# Patient Record
Sex: Female | Born: 1935
Health system: Southern US, Community
[De-identification: ages and names within clinical notes are randomized; demographics above are authoritative.]

## PROBLEM LIST (undated history)

## (undated) DIAGNOSIS — M25572 Pain in left ankle and joints of left foot: Secondary | ICD-10-CM

## (undated) DIAGNOSIS — N6019 Diffuse cystic mastopathy of unspecified breast: Secondary | ICD-10-CM

## (undated) DIAGNOSIS — K449 Diaphragmatic hernia without obstruction or gangrene: Secondary | ICD-10-CM

## (undated) DIAGNOSIS — IMO0001 Reserved for inherently not codable concepts without codable children: Secondary | ICD-10-CM

## (undated) DIAGNOSIS — M1990 Unspecified osteoarthritis, unspecified site: Secondary | ICD-10-CM

## (undated) DIAGNOSIS — I2510 Atherosclerotic heart disease of native coronary artery without angina pectoris: Secondary | ICD-10-CM

## (undated) DIAGNOSIS — D573 Sickle-cell trait: Secondary | ICD-10-CM

## (undated) DIAGNOSIS — E119 Type 2 diabetes mellitus without complications: Secondary | ICD-10-CM

## (undated) DIAGNOSIS — K219 Gastro-esophageal reflux disease without esophagitis: Secondary | ICD-10-CM

## (undated) DIAGNOSIS — E042 Nontoxic multinodular goiter: Secondary | ICD-10-CM

## (undated) DIAGNOSIS — I10 Essential (primary) hypertension: Secondary | ICD-10-CM

## (undated) DIAGNOSIS — D649 Anemia, unspecified: Secondary | ICD-10-CM

## (undated) DIAGNOSIS — Z794 Long term (current) use of insulin: Secondary | ICD-10-CM

## (undated) DIAGNOSIS — E7800 Pure hypercholesterolemia, unspecified: Secondary | ICD-10-CM

## (undated) HISTORY — DX: Atherosclerotic heart disease of native coronary artery without angina pectoris: I25.10

## (undated) HISTORY — DX: Diffuse cystic mastopathy of unspecified breast: N60.19

## (undated) HISTORY — DX: Unspecified osteoarthritis, unspecified site: M19.90

## (undated) HISTORY — DX: Essential (primary) hypertension: I10

## (undated) HISTORY — DX: Diaphragmatic hernia without obstruction or gangrene: K44.9

## (undated) HISTORY — DX: Gastro-esophageal reflux disease without esophagitis: K21.9

## (undated) HISTORY — DX: Long term (current) use of insulin: Z79.4

## (undated) HISTORY — DX: Type 2 diabetes mellitus without complications: E11.9

## (undated) HISTORY — DX: Pain in left ankle and joints of left foot: M25.572

## (undated) HISTORY — DX: Nontoxic multinodular goiter: E04.2

## (undated) HISTORY — PX: CORONARY ANGIOPLASTY: SHX604

## (undated) HISTORY — PX: BIOPSY THYROID: PRO38

## (undated) HISTORY — PX: RETINAL LASER PROCEDURE: SHX2339

## (undated) HISTORY — DX: Reserved for inherently not codable concepts without codable children: IMO0001

## (undated) HISTORY — DX: Anemia, unspecified: D64.9

## (undated) HISTORY — DX: Pure hypercholesterolemia, unspecified: E78.00

## (undated) HISTORY — DX: Sickle-cell trait: D57.3

---

## 1974-03-12 HISTORY — PX: OTHER SURGICAL HISTORY: SHX169

## 1988-03-12 HISTORY — PX: BACK SURGERY: SHX140

## 1999-01-24 ENCOUNTER — Other Ambulatory Visit: Admission: RE | Admit: 1999-01-24 | Discharge: 1999-01-24 | Payer: Self-pay | Admitting: Obstetrics and Gynecology

## 2000-03-19 ENCOUNTER — Other Ambulatory Visit: Admission: RE | Admit: 2000-03-19 | Discharge: 2000-03-19 | Payer: Self-pay | Admitting: Obstetrics and Gynecology

## 2001-03-18 ENCOUNTER — Emergency Department (HOSPITAL_COMMUNITY): Admission: EM | Admit: 2001-03-18 | Discharge: 2001-03-18 | Payer: Self-pay

## 2001-09-04 ENCOUNTER — Other Ambulatory Visit: Admission: RE | Admit: 2001-09-04 | Discharge: 2001-09-04 | Payer: Self-pay | Admitting: Obstetrics and Gynecology

## 2002-01-07 ENCOUNTER — Emergency Department (HOSPITAL_COMMUNITY): Admission: EM | Admit: 2002-01-07 | Discharge: 2002-01-08 | Payer: Self-pay | Admitting: Emergency Medicine

## 2002-03-12 HISTORY — PX: FOOT SURGERY: SHX648

## 2002-10-05 ENCOUNTER — Other Ambulatory Visit: Admission: RE | Admit: 2002-10-05 | Discharge: 2002-10-05 | Payer: Self-pay | Admitting: Obstetrics and Gynecology

## 2003-05-04 ENCOUNTER — Encounter: Admission: RE | Admit: 2003-05-04 | Discharge: 2003-05-04 | Payer: Self-pay | Admitting: Specialist

## 2004-01-20 ENCOUNTER — Ambulatory Visit: Payer: Self-pay | Admitting: Internal Medicine

## 2004-03-02 ENCOUNTER — Other Ambulatory Visit: Admission: RE | Admit: 2004-03-02 | Discharge: 2004-03-02 | Payer: Self-pay | Admitting: Obstetrics and Gynecology

## 2004-05-19 ENCOUNTER — Ambulatory Visit: Payer: Self-pay | Admitting: Internal Medicine

## 2004-08-21 ENCOUNTER — Ambulatory Visit: Payer: Self-pay | Admitting: Internal Medicine

## 2005-01-22 ENCOUNTER — Ambulatory Visit: Payer: Self-pay | Admitting: Internal Medicine

## 2005-03-26 ENCOUNTER — Ambulatory Visit: Payer: Self-pay | Admitting: Internal Medicine

## 2005-04-03 ENCOUNTER — Ambulatory Visit: Payer: Self-pay | Admitting: Internal Medicine

## 2005-04-16 ENCOUNTER — Other Ambulatory Visit: Admission: RE | Admit: 2005-04-16 | Discharge: 2005-04-16 | Payer: Self-pay | Admitting: Obstetrics and Gynecology

## 2005-06-27 ENCOUNTER — Ambulatory Visit: Payer: Self-pay | Admitting: Cardiology

## 2005-08-03 ENCOUNTER — Ambulatory Visit: Payer: Self-pay | Admitting: Cardiology

## 2005-09-14 ENCOUNTER — Ambulatory Visit: Payer: Self-pay | Admitting: Internal Medicine

## 2005-09-18 ENCOUNTER — Ambulatory Visit: Payer: Self-pay | Admitting: Internal Medicine

## 2005-11-29 ENCOUNTER — Ambulatory Visit: Payer: Self-pay | Admitting: Internal Medicine

## 2005-12-11 ENCOUNTER — Ambulatory Visit: Payer: Self-pay | Admitting: Internal Medicine

## 2006-01-09 ENCOUNTER — Ambulatory Visit: Payer: Self-pay | Admitting: Internal Medicine

## 2006-03-22 ENCOUNTER — Ambulatory Visit: Payer: Self-pay | Admitting: Cardiology

## 2006-04-12 ENCOUNTER — Ambulatory Visit: Payer: Self-pay | Admitting: Cardiology

## 2006-05-08 ENCOUNTER — Ambulatory Visit: Payer: Self-pay | Admitting: Internal Medicine

## 2006-05-10 LAB — CONVERTED CEMR LAB
ALT: 15 units/L (ref 0–40)
AST: 18 units/L (ref 0–37)
BUN: 15 mg/dL (ref 6–23)
Basophils Absolute: 0.1 10*3/uL (ref 0.0–0.1)
Basophils Relative: 1.1 % — ABNORMAL HIGH (ref 0.0–1.0)
Cholesterol: 166 mg/dL (ref 0–200)
Creatinine, Ser: 0.9 mg/dL (ref 0.4–1.2)
Creatinine,U: 146.4 mg/dL
Eosinophils Absolute: 0.1 10*3/uL (ref 0.0–0.6)
Eosinophils Relative: 2.7 % (ref 0.0–5.0)
HCT: 34.9 % — ABNORMAL LOW (ref 36.0–46.0)
HDL: 73 mg/dL (ref >39.0)
Hemoglobin: 11.7 g/dL — ABNORMAL LOW (ref 12.0–15.0)
Hgb A1c MFr Bld: 9.2 % — ABNORMAL HIGH (ref 4.6–6.0)
LDL Cholesterol: 73 mg/dL (ref 0–99)
Lymphocytes Relative: 34.8 % (ref 12.0–46.0)
MCHC: 33.7 g/dL (ref 30.0–36.0)
MCV: 77.8 fL — ABNORMAL LOW (ref 78.0–100.0)
Microalb Creat Ratio: 13.7 mg/g (ref 0.0–30.0)
Microalb, Ur: 2 mg/dL — ABNORMAL HIGH (ref 0.0–1.9)
Monocytes Absolute: 0.6 10*3/uL (ref 0.2–0.7)
Monocytes Relative: 11.7 % — ABNORMAL HIGH (ref 3.0–11.0)
Neutro Abs: 2.3 10*3/uL (ref 1.4–7.7)
Neutrophils Relative %: 49.7 % (ref 43.0–77.0)
Platelets: 269 10*3/uL (ref 150–400)
Potassium: 4.3 meq/L (ref 3.5–5.1)
RBC: 4.48 M/uL (ref 3.87–5.11)
RDW: 14.2 % (ref 11.5–14.6)
Total CHOL/HDL Ratio: 2.3
Triglycerides: 99 mg/dL (ref 0–149)
VLDL: 20 mg/dL (ref 0–40)
WBC: 4.8 10*3/uL (ref 4.5–10.5)

## 2006-05-23 ENCOUNTER — Ambulatory Visit: Payer: Self-pay | Admitting: Internal Medicine

## 2006-05-23 LAB — CONVERTED CEMR LAB
Folate: >20 ng/mL
HCT: 35.8 % — ABNORMAL LOW (ref 36.0–46.0)
Iron: 82 ug/dL (ref 42–145)
Saturation Ratios: 27.7 % (ref 20.0–50.0)
Transferrin: 211.8 mg/dL — ABNORMAL LOW (ref >=212.0)
Vitamin B-12: 640 pg/mL (ref 211–911)

## 2006-05-27 DIAGNOSIS — E7800 Pure hypercholesterolemia, unspecified: Secondary | ICD-10-CM | POA: Insufficient documentation

## 2006-05-27 DIAGNOSIS — E1169 Type 2 diabetes mellitus with other specified complication: Secondary | ICD-10-CM | POA: Insufficient documentation

## 2006-05-27 DIAGNOSIS — E1159 Type 2 diabetes mellitus with other circulatory complications: Secondary | ICD-10-CM | POA: Insufficient documentation

## 2006-05-27 DIAGNOSIS — I10 Essential (primary) hypertension: Secondary | ICD-10-CM | POA: Insufficient documentation

## 2006-06-05 ENCOUNTER — Ambulatory Visit: Payer: Self-pay | Admitting: Internal Medicine

## 2006-06-13 ENCOUNTER — Ambulatory Visit: Payer: Self-pay | Admitting: Endocrinology

## 2006-06-20 ENCOUNTER — Encounter: Admission: RE | Admit: 2006-06-20 | Discharge: 2006-06-20 | Payer: Self-pay | Admitting: Endocrinology

## 2006-06-25 ENCOUNTER — Ambulatory Visit: Payer: Self-pay | Admitting: Endocrinology

## 2006-06-25 LAB — CONVERTED CEMR LAB: TSH: 1.17 microintl units/mL (ref 0.35–5.50)

## 2006-07-04 ENCOUNTER — Ambulatory Visit: Payer: Self-pay | Admitting: Endocrinology

## 2006-07-11 ENCOUNTER — Encounter: Admission: RE | Admit: 2006-07-11 | Discharge: 2006-07-11 | Payer: Self-pay | Admitting: Endocrinology

## 2006-07-11 ENCOUNTER — Encounter (INDEPENDENT_AMBULATORY_CARE_PROVIDER_SITE_OTHER): Payer: Self-pay | Admitting: Specialist

## 2006-07-11 ENCOUNTER — Other Ambulatory Visit: Admission: RE | Admit: 2006-07-11 | Discharge: 2006-07-11 | Payer: Self-pay | Admitting: Interventional Radiology

## 2006-07-25 ENCOUNTER — Ambulatory Visit: Payer: Self-pay | Admitting: Endocrinology

## 2006-07-31 DIAGNOSIS — N6019 Diffuse cystic mastopathy of unspecified breast: Secondary | ICD-10-CM | POA: Insufficient documentation

## 2006-08-20 ENCOUNTER — Ambulatory Visit: Payer: Self-pay | Admitting: Endocrinology

## 2006-09-04 ENCOUNTER — Ambulatory Visit: Payer: Self-pay | Admitting: Endocrinology

## 2006-10-31 ENCOUNTER — Ambulatory Visit: Payer: Self-pay | Admitting: Cardiology

## 2006-10-31 ENCOUNTER — Ambulatory Visit: Payer: Self-pay | Admitting: Endocrinology

## 2006-10-31 LAB — CONVERTED CEMR LAB
ALT: 18 units/L (ref 0–35)
AST: 19 units/L (ref 0–37)
Albumin: 3.5 g/dL (ref 3.5–5.2)
Alkaline Phosphatase: 45 units/L (ref 39–117)
Bilirubin, Direct: 0.1 mg/dL (ref 0.0–0.3)
Cholesterol: 164 mg/dL (ref 0–200)
HDL: 76.9 mg/dL (ref >39.0)
Hgb A1c MFr Bld: 8.5 % — ABNORMAL HIGH (ref 4.6–6.0)
LDL Cholesterol: 75 mg/dL (ref 0–99)
Total Bilirubin: 0.7 mg/dL (ref 0.3–1.2)
Total CHOL/HDL Ratio: 2.1
Total Protein: 6.5 g/dL (ref 6.0–8.3)
Triglycerides: 61 mg/dL (ref 0–149)
VLDL: 12 mg/dL (ref 0–40)

## 2006-11-05 ENCOUNTER — Encounter: Payer: Self-pay | Admitting: Endocrinology

## 2006-11-26 ENCOUNTER — Encounter: Payer: Self-pay | Admitting: Endocrinology

## 2006-11-26 DIAGNOSIS — E042 Nontoxic multinodular goiter: Secondary | ICD-10-CM | POA: Insufficient documentation

## 2006-11-26 DIAGNOSIS — K449 Diaphragmatic hernia without obstruction or gangrene: Secondary | ICD-10-CM | POA: Insufficient documentation

## 2006-11-26 DIAGNOSIS — I2510 Atherosclerotic heart disease of native coronary artery without angina pectoris: Secondary | ICD-10-CM | POA: Insufficient documentation

## 2006-11-27 ENCOUNTER — Ambulatory Visit: Payer: Self-pay | Admitting: Endocrinology

## 2006-12-05 ENCOUNTER — Ambulatory Visit: Payer: Self-pay | Admitting: Internal Medicine

## 2006-12-26 ENCOUNTER — Ambulatory Visit: Payer: Self-pay | Admitting: Endocrinology

## 2006-12-31 ENCOUNTER — Encounter: Payer: Self-pay | Admitting: Endocrinology

## 2006-12-31 ENCOUNTER — Ambulatory Visit: Payer: Self-pay | Admitting: Endocrinology

## 2007-01-06 ENCOUNTER — Ambulatory Visit: Payer: Self-pay | Admitting: Endocrinology

## 2007-02-03 ENCOUNTER — Ambulatory Visit: Payer: Self-pay | Admitting: Endocrinology

## 2007-02-24 ENCOUNTER — Telehealth: Payer: Self-pay | Admitting: Internal Medicine

## 2007-04-12 ENCOUNTER — Ambulatory Visit: Payer: Self-pay | Admitting: Family Medicine

## 2007-04-25 ENCOUNTER — Ambulatory Visit: Payer: Self-pay | Admitting: Cardiology

## 2007-05-05 ENCOUNTER — Ambulatory Visit: Payer: Self-pay | Admitting: Endocrinology

## 2007-05-05 LAB — CONVERTED CEMR LAB: Hgb A1c MFr Bld: 8.5 % — ABNORMAL HIGH (ref 4.6–6.0)

## 2007-08-05 ENCOUNTER — Ambulatory Visit: Payer: Self-pay | Admitting: Endocrinology

## 2007-08-05 LAB — CONVERTED CEMR LAB
Hgb A1c MFr Bld: 8.4 % — ABNORMAL HIGH (ref 4.6–6.0)
TSH: 1.24 microintl units/mL (ref 0.35–5.50)

## 2007-09-02 ENCOUNTER — Ambulatory Visit: Payer: Self-pay | Admitting: Internal Medicine

## 2007-09-02 DIAGNOSIS — K219 Gastro-esophageal reflux disease without esophagitis: Secondary | ICD-10-CM | POA: Insufficient documentation

## 2007-09-02 LAB — CONVERTED CEMR LAB
Cholesterol, target level: 200 mg/dL
HDL goal, serum: 40 mg/dL
LDL Goal: 70 mg/dL

## 2007-09-09 ENCOUNTER — Ambulatory Visit: Payer: Self-pay | Admitting: Internal Medicine

## 2007-09-09 ENCOUNTER — Encounter (INDEPENDENT_AMBULATORY_CARE_PROVIDER_SITE_OTHER): Payer: Self-pay | Admitting: *Deleted

## 2007-09-09 LAB — CONVERTED CEMR LAB
OCCULT 1: NEGATIVE
OCCULT 2: NEGATIVE
OCCULT 3: NEGATIVE

## 2007-09-15 ENCOUNTER — Encounter (INDEPENDENT_AMBULATORY_CARE_PROVIDER_SITE_OTHER): Payer: Self-pay | Admitting: *Deleted

## 2007-09-15 ENCOUNTER — Telehealth (INDEPENDENT_AMBULATORY_CARE_PROVIDER_SITE_OTHER): Payer: Self-pay | Admitting: *Deleted

## 2007-09-15 LAB — CONVERTED CEMR LAB
ALT: 22 units/L (ref 0–35)
AST: 21 units/L (ref 0–37)
Albumin: 3.5 g/dL (ref 3.5–5.2)
Alkaline Phosphatase: 62 units/L (ref 39–117)
BUN: 20 mg/dL (ref 6–23)
Basophils Absolute: 0.1 10*3/uL (ref 0.0–0.1)
Basophils Relative: 3.1 % — ABNORMAL HIGH (ref 0.0–1.0)
Bilirubin, Direct: 0.1 mg/dL (ref 0.0–0.3)
Cholesterol: 181 mg/dL (ref 0–200)
Creatinine, Ser: 1 mg/dL (ref 0.4–1.2)
Eosinophils Absolute: 0.2 10*3/uL (ref 0.0–0.7)
Eosinophils Relative: 4.8 % (ref 0.0–5.0)
HCT: 34.1 % — ABNORMAL LOW (ref 36.0–46.0)
HDL: 75.1 mg/dL (ref >39.0)
Hemoglobin: 11.5 g/dL — ABNORMAL LOW (ref 12.0–15.0)
LDL Cholesterol: 88 mg/dL (ref 0–99)
Lymphocytes Relative: 41.8 % (ref 12.0–46.0)
MCHC: 33.8 g/dL (ref 30.0–36.0)
MCV: 79.6 fL (ref 78.0–100.0)
Monocytes Absolute: 0.5 10*3/uL (ref 0.1–1.0)
Monocytes Relative: 12.7 % — ABNORMAL HIGH (ref 3.0–12.0)
Neutro Abs: 1.4 10*3/uL (ref 1.4–7.7)
Neutrophils Relative %: 37.6 % — ABNORMAL LOW (ref 43.0–77.0)
Platelets: 218 10*3/uL (ref 150–400)
Potassium: 4.4 meq/L (ref 3.5–5.1)
RBC: 4.28 M/uL (ref 3.87–5.11)
RDW: 15.2 % — ABNORMAL HIGH (ref 11.5–14.6)
Total Bilirubin: 0.5 mg/dL (ref 0.3–1.2)
Total CHOL/HDL Ratio: 2.4
Total Protein: 6.8 g/dL (ref 6.0–8.3)
Triglycerides: 92 mg/dL (ref 0–149)
VLDL: 18 mg/dL (ref 0–40)
WBC: 3.7 10*3/uL — ABNORMAL LOW (ref 4.5–10.5)

## 2007-09-18 ENCOUNTER — Ambulatory Visit: Payer: Self-pay | Admitting: Internal Medicine

## 2007-09-29 ENCOUNTER — Encounter (INDEPENDENT_AMBULATORY_CARE_PROVIDER_SITE_OTHER): Payer: Self-pay | Admitting: *Deleted

## 2007-09-29 LAB — CONVERTED CEMR LAB
Basophils Absolute: 0 10*3/uL (ref 0.0–0.1)
Basophils Relative: 1.2 % — ABNORMAL HIGH (ref 0.0–1.0)
Eosinophils Absolute: 0.2 10*3/uL (ref 0.0–0.7)
Eosinophils Relative: 4.5 % (ref 0.0–5.0)
Folate: 19.1 ng/mL
HCT: 34.4 % — ABNORMAL LOW (ref 36.0–46.0)
Hemoglobin: 11.4 g/dL — ABNORMAL LOW (ref 12.0–15.0)
Iron: 96 ug/dL (ref 42–145)
Lymphocytes Relative: 42.8 % (ref 12.0–46.0)
MCHC: 33 g/dL (ref 30.0–36.0)
MCV: 80.5 fL (ref 78.0–100.0)
Monocytes Absolute: 0.5 10*3/uL (ref 0.1–1.0)
Monocytes Relative: 12.1 % — ABNORMAL HIGH (ref 3.0–12.0)
Neutro Abs: 1.6 10*3/uL (ref 1.4–7.7)
Neutrophils Relative %: 39.4 % — ABNORMAL LOW (ref 43.0–77.0)
Platelets: 239 10*3/uL (ref 150–400)
RBC: 4.28 M/uL (ref 3.87–5.11)
RDW: 15.1 % — ABNORMAL HIGH (ref 11.5–14.6)
Saturation Ratios: 36 % (ref 20.0–50.0)
Transferrin: 190.3 mg/dL — ABNORMAL LOW (ref >=212.0)
Vitamin B-12: 542 pg/mL (ref 211–911)
WBC: 4.1 10*3/uL — ABNORMAL LOW (ref 4.5–10.5)

## 2007-11-06 ENCOUNTER — Ambulatory Visit: Payer: Self-pay | Admitting: Endocrinology

## 2007-11-06 LAB — CONVERTED CEMR LAB: Hgb A1c MFr Bld: 7.8 % — ABNORMAL HIGH (ref 4.6–6.0)

## 2007-11-07 ENCOUNTER — Encounter: Payer: Self-pay | Admitting: Endocrinology

## 2008-01-01 ENCOUNTER — Ambulatory Visit: Payer: Self-pay | Admitting: Internal Medicine

## 2008-01-01 DIAGNOSIS — D649 Anemia, unspecified: Secondary | ICD-10-CM | POA: Insufficient documentation

## 2008-01-01 DIAGNOSIS — D573 Sickle-cell trait: Secondary | ICD-10-CM | POA: Insufficient documentation

## 2008-01-01 DIAGNOSIS — M25579 Pain in unspecified ankle and joints of unspecified foot: Secondary | ICD-10-CM | POA: Insufficient documentation

## 2008-01-05 ENCOUNTER — Encounter (INDEPENDENT_AMBULATORY_CARE_PROVIDER_SITE_OTHER): Payer: Self-pay | Admitting: *Deleted

## 2008-01-05 LAB — CONVERTED CEMR LAB
Basophils Absolute: 0 10*3/uL (ref 0.0–0.1)
Basophils Relative: 0.5 % (ref 0.0–3.0)
Eosinophils Absolute: 0.2 10*3/uL (ref 0.0–0.7)
Eosinophils Relative: 2.7 % (ref 0.0–5.0)
HCT: 33.5 % — ABNORMAL LOW (ref 36.0–46.0)
Hemoglobin: 11.3 g/dL — ABNORMAL LOW (ref 12.0–15.0)
Lymphocytes Relative: 33.2 % (ref 12.0–46.0)
MCHC: 33.6 g/dL (ref 30.0–36.0)
MCV: 80.8 fL (ref 78.0–100.0)
Monocytes Absolute: 0.4 10*3/uL (ref 0.1–1.0)
Monocytes Relative: 6.5 % (ref 3.0–12.0)
Neutro Abs: 3.5 10*3/uL (ref 1.4–7.7)
Neutrophils Relative %: 57.1 % (ref 43.0–77.0)
Platelets: 193 10*3/uL (ref 150–400)
RBC: 4.14 M/uL (ref 3.87–5.11)
RDW: 14.5 % (ref 11.5–14.6)
Uric Acid, Serum: 6 mg/dL (ref 2.4–7.0)
WBC: 6.1 10*3/uL (ref 4.5–10.5)

## 2008-01-29 ENCOUNTER — Ambulatory Visit: Payer: Self-pay | Admitting: Endocrinology

## 2008-01-29 LAB — CONVERTED CEMR LAB: Hgb A1c MFr Bld: 7.6 % — ABNORMAL HIGH (ref 4.6–6.0)

## 2008-04-26 ENCOUNTER — Ambulatory Visit: Payer: Self-pay | Admitting: Endocrinology

## 2008-04-26 LAB — CONVERTED CEMR LAB: Hgb A1c MFr Bld: 8.4 % — ABNORMAL HIGH (ref 4.6–6.0)

## 2008-04-28 ENCOUNTER — Telehealth (INDEPENDENT_AMBULATORY_CARE_PROVIDER_SITE_OTHER): Payer: Self-pay | Admitting: *Deleted

## 2008-05-12 ENCOUNTER — Ambulatory Visit: Payer: Self-pay | Admitting: Cardiology

## 2008-05-27 ENCOUNTER — Encounter: Payer: Self-pay | Admitting: Endocrinology

## 2008-06-07 ENCOUNTER — Encounter: Admission: RE | Admit: 2008-06-07 | Discharge: 2008-06-07 | Payer: Self-pay | Admitting: Endocrinology

## 2008-07-27 ENCOUNTER — Ambulatory Visit: Payer: Self-pay | Admitting: Endocrinology

## 2008-07-27 LAB — CONVERTED CEMR LAB
Creatinine,U: 50.2 mg/dL
Hgb A1c MFr Bld: 8.6 % — ABNORMAL HIGH (ref 4.6–6.5)
Microalb Creat Ratio: 15.9 mg/g (ref 0.0–30.0)
Microalb, Ur: 0.8 mg/dL (ref 0.0–1.9)
TSH: 1.39 microintl units/mL (ref 0.35–5.50)

## 2008-08-05 ENCOUNTER — Encounter: Payer: Self-pay | Admitting: Internal Medicine

## 2008-08-12 ENCOUNTER — Ambulatory Visit: Payer: Self-pay | Admitting: Internal Medicine

## 2008-08-12 DIAGNOSIS — N959 Unspecified menopausal and perimenopausal disorder: Secondary | ICD-10-CM | POA: Insufficient documentation

## 2008-08-14 LAB — CONVERTED CEMR LAB
ALT: 21 units/L (ref 0–35)
AST: 22 units/L (ref 0–37)
Albumin: 3.7 g/dL (ref 3.5–5.2)
Alkaline Phosphatase: 58 units/L (ref 39–117)
Basophils Absolute: 0 10*3/uL (ref 0.0–0.1)
Basophils Relative: 0.9 % (ref 0.0–3.0)
Bilirubin, Direct: 0.1 mg/dL (ref 0.0–0.3)
Cholesterol: 199 mg/dL (ref 0–200)
Eosinophils Absolute: 0.1 10*3/uL (ref 0.0–0.7)
Eosinophils Relative: 2.8 % (ref 0.0–5.0)
HCT: 35.5 % — ABNORMAL LOW (ref 36.0–46.0)
HDL: 90.2 mg/dL (ref >39.00)
Hemoglobin: 11.9 g/dL — ABNORMAL LOW (ref 12.0–15.0)
LDL Cholesterol: 93 mg/dL (ref 0–99)
Lymphocytes Relative: 35.3 % (ref 12.0–46.0)
Lymphs Abs: 1.5 10*3/uL (ref 0.7–4.0)
MCHC: 33.4 g/dL (ref 30.0–36.0)
MCV: 80.7 fL (ref 78.0–100.0)
Monocytes Absolute: 0.5 10*3/uL (ref 0.1–1.0)
Monocytes Relative: 12 % (ref 3.0–12.0)
Neutro Abs: 2.1 10*3/uL (ref 1.4–7.7)
Neutrophils Relative %: 49 % (ref 43.0–77.0)
Platelets: 193 10*3/uL (ref 150.0–400.0)
RBC: 4.4 M/uL (ref 3.87–5.11)
RDW: 14.8 % — ABNORMAL HIGH (ref 11.5–14.6)
Total Bilirubin: 0.5 mg/dL (ref 0.3–1.2)
Total CHOL/HDL Ratio: 2
Total Protein: 6.7 g/dL (ref 6.0–8.3)
Triglycerides: 81 mg/dL (ref 0.0–149.0)
VLDL: 16.2 mg/dL (ref 0.0–40.0)
Vit D, 25-Hydroxy: 24 ng/mL — ABNORMAL LOW (ref 30–89)
WBC: 4.2 10*3/uL — ABNORMAL LOW (ref 4.5–10.5)

## 2008-08-16 ENCOUNTER — Encounter (INDEPENDENT_AMBULATORY_CARE_PROVIDER_SITE_OTHER): Payer: Self-pay | Admitting: *Deleted

## 2008-08-17 ENCOUNTER — Ambulatory Visit: Payer: Self-pay | Admitting: Family Medicine

## 2008-08-17 ENCOUNTER — Encounter: Payer: Self-pay | Admitting: Internal Medicine

## 2008-09-07 ENCOUNTER — Ambulatory Visit: Payer: Self-pay | Admitting: Endocrinology

## 2008-09-17 ENCOUNTER — Encounter (INDEPENDENT_AMBULATORY_CARE_PROVIDER_SITE_OTHER): Payer: Self-pay | Admitting: *Deleted

## 2008-09-28 ENCOUNTER — Encounter: Payer: Self-pay | Admitting: Internal Medicine

## 2008-11-04 ENCOUNTER — Encounter: Payer: Self-pay | Admitting: Endocrinology

## 2008-11-08 ENCOUNTER — Ambulatory Visit: Payer: Self-pay | Admitting: Internal Medicine

## 2008-11-08 DIAGNOSIS — M2560 Stiffness of unspecified joint, not elsewhere classified: Secondary | ICD-10-CM | POA: Insufficient documentation

## 2008-11-08 DIAGNOSIS — E559 Vitamin D deficiency, unspecified: Secondary | ICD-10-CM | POA: Insufficient documentation

## 2008-11-08 DIAGNOSIS — R609 Edema, unspecified: Secondary | ICD-10-CM | POA: Insufficient documentation

## 2008-11-16 ENCOUNTER — Encounter (INDEPENDENT_AMBULATORY_CARE_PROVIDER_SITE_OTHER): Payer: Self-pay | Admitting: *Deleted

## 2008-11-16 LAB — CONVERTED CEMR LAB
BUN: 30 mg/dL — ABNORMAL HIGH (ref 6–23)
Creatinine, Ser: 1 mg/dL (ref 0.4–1.2)
Potassium: 4.3 meq/L (ref 3.5–5.1)
Sed Rate: 33 mm/hr — ABNORMAL HIGH (ref 0–22)
Total CK: 183 units/L — ABNORMAL HIGH (ref 7–177)

## 2008-11-19 ENCOUNTER — Encounter: Payer: Self-pay | Admitting: Internal Medicine

## 2008-11-19 ENCOUNTER — Telehealth (INDEPENDENT_AMBULATORY_CARE_PROVIDER_SITE_OTHER): Payer: Self-pay | Admitting: *Deleted

## 2008-11-22 ENCOUNTER — Telehealth: Payer: Self-pay | Admitting: Endocrinology

## 2008-11-29 ENCOUNTER — Telehealth (INDEPENDENT_AMBULATORY_CARE_PROVIDER_SITE_OTHER): Payer: Self-pay | Admitting: *Deleted

## 2008-12-06 ENCOUNTER — Encounter: Payer: Self-pay | Admitting: Internal Medicine

## 2008-12-07 ENCOUNTER — Ambulatory Visit: Payer: Self-pay | Admitting: Endocrinology

## 2008-12-07 LAB — CONVERTED CEMR LAB
Creatinine,U: 37.1 mg/dL
Hgb A1c MFr Bld: 7.7 % — ABNORMAL HIGH (ref 4.6–6.5)
Microalb Creat Ratio: 5.4 mg/g (ref 0.0–30.0)
Microalb, Ur: 0.2 mg/dL (ref 0.0–1.9)

## 2008-12-09 ENCOUNTER — Encounter: Payer: Self-pay | Admitting: Internal Medicine

## 2008-12-28 ENCOUNTER — Telehealth: Payer: Self-pay | Admitting: Endocrinology

## 2009-02-07 ENCOUNTER — Ambulatory Visit: Payer: Self-pay | Admitting: Internal Medicine

## 2009-02-08 ENCOUNTER — Encounter (INDEPENDENT_AMBULATORY_CARE_PROVIDER_SITE_OTHER): Payer: Self-pay | Admitting: *Deleted

## 2009-02-08 LAB — CONVERTED CEMR LAB: Vit D, 25-Hydroxy: 19 ng/mL — ABNORMAL LOW (ref 30–89)

## 2009-02-22 ENCOUNTER — Ambulatory Visit: Payer: Self-pay | Admitting: Endocrinology

## 2009-02-22 LAB — CONVERTED CEMR LAB: Hgb A1c MFr Bld: 7.8 % — ABNORMAL HIGH (ref 4.6–6.5)

## 2009-04-20 ENCOUNTER — Inpatient Hospital Stay (HOSPITAL_COMMUNITY): Admission: EM | Admit: 2009-04-20 | Discharge: 2009-04-23 | Payer: Self-pay | Admitting: Emergency Medicine

## 2009-04-20 ENCOUNTER — Ambulatory Visit: Payer: Self-pay | Admitting: Internal Medicine

## 2009-04-20 ENCOUNTER — Telehealth: Payer: Self-pay | Admitting: Cardiology

## 2009-04-21 ENCOUNTER — Encounter: Payer: Self-pay | Admitting: Cardiology

## 2009-04-28 ENCOUNTER — Ambulatory Visit: Payer: Self-pay | Admitting: Internal Medicine

## 2009-04-28 DIAGNOSIS — R002 Palpitations: Secondary | ICD-10-CM | POA: Insufficient documentation

## 2009-04-28 DIAGNOSIS — F329 Major depressive disorder, single episode, unspecified: Secondary | ICD-10-CM | POA: Insufficient documentation

## 2009-04-28 DIAGNOSIS — R079 Chest pain, unspecified: Secondary | ICD-10-CM | POA: Insufficient documentation

## 2009-05-09 ENCOUNTER — Telehealth (INDEPENDENT_AMBULATORY_CARE_PROVIDER_SITE_OTHER): Payer: Self-pay | Admitting: *Deleted

## 2009-05-10 ENCOUNTER — Ambulatory Visit: Payer: Self-pay | Admitting: Internal Medicine

## 2009-05-10 ENCOUNTER — Encounter (HOSPITAL_COMMUNITY): Admission: RE | Admit: 2009-05-10 | Discharge: 2009-07-12 | Payer: Self-pay | Admitting: Cardiology

## 2009-05-10 ENCOUNTER — Ambulatory Visit: Payer: Self-pay

## 2009-05-11 ENCOUNTER — Telehealth (INDEPENDENT_AMBULATORY_CARE_PROVIDER_SITE_OTHER): Payer: Self-pay | Admitting: *Deleted

## 2009-05-13 ENCOUNTER — Telehealth: Payer: Self-pay | Admitting: Cardiology

## 2009-05-24 ENCOUNTER — Ambulatory Visit: Payer: Self-pay | Admitting: Endocrinology

## 2009-05-24 LAB — CONVERTED CEMR LAB: Hgb A1c MFr Bld: 8.3 % — ABNORMAL HIGH (ref 4.6–6.5)

## 2009-06-06 ENCOUNTER — Ambulatory Visit: Payer: Self-pay | Admitting: Cardiology

## 2009-08-04 ENCOUNTER — Encounter: Payer: Self-pay | Admitting: Endocrinology

## 2009-09-01 ENCOUNTER — Ambulatory Visit: Payer: Self-pay | Admitting: Endocrinology

## 2009-09-01 LAB — CONVERTED CEMR LAB: Hgb A1c MFr Bld: 7.2 % — ABNORMAL HIGH (ref 4.6–6.5)

## 2009-09-02 LAB — CONVERTED CEMR LAB: Fructosamine: 278 micromoles/L (ref <285)

## 2009-12-01 ENCOUNTER — Ambulatory Visit: Payer: Self-pay | Admitting: Endocrinology

## 2009-12-01 LAB — CONVERTED CEMR LAB: Hgb A1c MFr Bld: 8.1 % — ABNORMAL HIGH (ref 4.6–6.5)

## 2009-12-13 ENCOUNTER — Ambulatory Visit: Payer: Self-pay | Admitting: Internal Medicine

## 2009-12-15 ENCOUNTER — Ambulatory Visit: Payer: Self-pay | Admitting: Internal Medicine

## 2009-12-16 LAB — CONVERTED CEMR LAB
ALT: 23 units/L (ref 0–35)
AST: 26 units/L (ref 0–37)
Albumin: 3.5 g/dL (ref 3.5–5.2)
Alkaline Phosphatase: 66 units/L (ref 39–117)
BUN: 28 mg/dL — ABNORMAL HIGH (ref 6–23)
Basophils Absolute: 0.1 10*3/uL (ref 0.0–0.1)
Basophils Relative: 1.9 % (ref 0.0–3.0)
Bilirubin, Direct: 0.1 mg/dL (ref 0.0–0.3)
CO2: 28 meq/L (ref 19–32)
Calcium: 9.4 mg/dL (ref 8.4–10.5)
Chloride: 103 meq/L (ref 96–112)
Cholesterol: 179 mg/dL (ref 0–200)
Creatinine, Ser: 1.1 mg/dL (ref 0.4–1.2)
Eosinophils Absolute: 0.3 10*3/uL (ref 0.0–0.7)
Eosinophils Relative: 5.1 % — ABNORMAL HIGH (ref 0.0–5.0)
GFR calc non Af Amer: 62.85 mL/min (ref >60)
Glucose, Bld: 134 mg/dL — ABNORMAL HIGH (ref 70–99)
HCT: 35.4 % — ABNORMAL LOW (ref 36.0–46.0)
HDL: 79.1 mg/dL (ref >39.00)
Hemoglobin: 11.9 g/dL — ABNORMAL LOW (ref 12.0–15.0)
LDL Cholesterol: 82 mg/dL (ref 0–99)
Lymphocytes Relative: 38.7 % (ref 12.0–46.0)
Lymphs Abs: 1.9 10*3/uL (ref 0.7–4.0)
MCHC: 33.7 g/dL (ref 30.0–36.0)
MCV: 80.1 fL (ref 78.0–100.0)
Monocytes Absolute: 0.7 10*3/uL (ref 0.1–1.0)
Monocytes Relative: 13.9 % — ABNORMAL HIGH (ref 3.0–12.0)
Neutro Abs: 2 10*3/uL (ref 1.4–7.7)
Neutrophils Relative %: 40.4 % — ABNORMAL LOW (ref 43.0–77.0)
Platelets: 204 10*3/uL (ref 150.0–400.0)
Potassium: 5.3 meq/L — ABNORMAL HIGH (ref 3.5–5.1)
RBC: 4.42 M/uL (ref 3.87–5.11)
RDW: 16.3 % — ABNORMAL HIGH (ref 11.5–14.6)
Sodium: 139 meq/L (ref 135–145)
TSH: 1.31 microintl units/mL (ref 0.35–5.50)
Total Bilirubin: 0.4 mg/dL (ref 0.3–1.2)
Total CHOL/HDL Ratio: 2
Total Protein: 6.2 g/dL (ref 6.0–8.3)
Triglycerides: 92 mg/dL (ref 0.0–149.0)
VLDL: 18.4 mg/dL (ref 0.0–40.0)
WBC: 4.9 10*3/uL (ref 4.5–10.5)

## 2010-03-30 ENCOUNTER — Ambulatory Visit
Admission: RE | Admit: 2010-03-30 | Discharge: 2010-03-30 | Payer: Self-pay | Source: Home / Self Care | Attending: Endocrinology | Admitting: Endocrinology

## 2010-03-30 ENCOUNTER — Other Ambulatory Visit: Payer: Self-pay | Admitting: Endocrinology

## 2010-03-30 LAB — HEMOGLOBIN A1C: Hgb A1c MFr Bld: 8.1 % — ABNORMAL HIGH (ref 4.6–6.5)

## 2010-04-10 ENCOUNTER — Telehealth: Payer: Self-pay | Admitting: Endocrinology

## 2010-04-11 NOTE — Miscellaneous (Signed)
Summary: BONE DENSITY  Clinical Lists Changes  Orders: Added new Test order of T-Bone Densitometry (77080) - Signed Added new Test order of T-Lumbar Vertebral Assessment (77082) - Signed 

## 2010-04-11 NOTE — Assessment & Plan Note (Signed)
Summary: 3 mth fu  stc   Vital Signs:  Patient profile:   77 year old female Height:      65 inches (165.10 cm) Weight:      209.25 pounds (95.11 kg) BMI:     34.95 O2 Sat:      99 % on Room air Temp:     98.4 degrees F (36.89 degrees C) oral Pulse rate:   67 / minute BP sitting:   116 / 60  (left arm) Cuff size:   large  Vitals Entered By: Brenton Grills MA (December 01, 2009 2:59 PM)  O2 Flow:  Room air CC: 3 month F/U/pt is no longer taking Fluoxetine/aj Is Patient Diabetic? Yes   Primary Provider:  Dr. Alwyn Ren  CC:  3 month F/U/pt is no longer taking Fluoxetine/aj.  History of Present Illness: she brings a record of her cbg's which i have reviewed today.  it varies from 79-120, with no trend throughout the day.  pt states she feels well in general.  Current Medications (verified): 1)  Amlodipine Besylate 10 Mg Tabs (Amlodipine Besylate) .Marland Kitchen.. 1 By Mouth Once Daily (Dose Was Increased in The Hospital) 2)  Novolin 70/30 70-30 %  Susp (Insulin Isophane & Regular) .Marland Kitchen.. 15 Units Qam 6 Units Qpm 3)  Quinapril Hcl 40 Mg Tabs (Quinapril Hcl) .Marland Kitchen.. 1 By Mouth Once Daily 4)  Accusure Insulin Syringe 31g X 5/16" 1 Ml  Misc (Insulin Syringe-Needle U-100) .... Two Times A Day  Size and Brand of Patient's Choice 5)  Imdur 30 Mg  Tb24 (Isosorbide Mononitrate) .Marland Kitchen.. 1 By Mouth Qd 6)  Omeprazole 20 Mg  Cpdr (Omeprazole) .... Take 1 By Mouth Q Am 7)  Lipitor 10 Mg  Tabs (Atorvastatin Calcium) .... Take 1 By Mouth Once Daily **appointment Due** 8)  Nitrostat 0.4 Mg  Subl (Nitroglycerin) .Marland Kitchen.. 1 By Mouth As Directed For Chest Pain 9)  Hydrochlorothiazide 12.5 Mg  Caps (Hydrochlorothiazide) .Marland Kitchen.. 1 Once Daily Prn 10)  Aspirin Adult Low Strength 81 Mg Tbec (Aspirin) .Marland Kitchen.. 1 By Mouth Once Daily 11)  Calcium 600 1500 Mg Tabs (Calcium Carbonate) .... 2 By Mouth Once Daily 12)  Vitamin E Complex 400 Unit Caps (Vitamin E) .Marland Kitchen.. 1 By Mouth Once Daily 13)  Cod Liver Oil  Caps (Cod Liver Oil) .Marland Kitchen.. 1 By  Mouth Once Daily Durning The Winter 14)  Vitamin C 500 Mg Tabs (Ascorbic Acid) .Marland Kitchen.. 1 By Mouth Twice Daily 15)  Vitamin D 5000 Unit Tabs (Cholecalciferol) .Marland Kitchen.. 1 By Mouth Once Daily 16)  Multivitamins  Tabs (Multiple Vitamin) .Marland Kitchen.. 1 By Mouth Once Daily 17)  Fluoxetine Hcl 20 Mg Caps (Fluoxetine Hcl) .Marland Kitchen.. 1 Once Daily  Allergies (verified): 1)  ! Codeine  Past History:  Past Medical History: Last updated: 08/02/2008 CORONARY ARTERY DISEASE (ICD-414.00) HYPERCHOLESTEROLEMIA (ICD-272.0) HYPERTENSION (ICD-401.9) SICKLE-CELL TRAIT (ICD-282.5) UNSPECIFIED ANEMIA (ICD-285.9) ANKLE PAIN, LEFT (ICD-719.47) ESOPHAGEAL REFLUX (ICD-530.81) GOITER, MULTINODULAR (ICD-241.1) HIATAL HERNIA (ICD-553.3) IDDM (ICD-250.01) -- type II; Multinodular goiter; Sickle Cell Trait FIBROCYSTIC BREAST DISEASE (ICD-610.1)  Review of Systems  The patient denies hypoglycemia.    Physical Exam  General:  obese.  no distress  Skin:  insulin injection sites at anterior abdomen are normal  Additional Exam:  Hemoglobin A1C       [H]  8.1 %      Impression & Recommendations:  Problem # 1:  IDDM (ICD-250.01) cbg is too variable to increase insulin  Other Orders: TLB-A1C / Hgb A1C (Glycohemoglobin) (83036-A1C) Est. Patient  Level III (16109)  Patient Instructions: 1)  tests are being ordered for you today.  a few days after the test(s), please call [phone] to hear your test results. 2)  check your blood sugar 1-2 times a day.  vary the time of day when you check, between before the 3 meals, and at bedtime.  also check if you have symptoms of your blood sugar being too high or too low.  please keep a record of the readings and bring it to your next appointment here.  please call us sooner if you are having low blood sugar episodes. 3)  Please schedule a follow-up appointment in 4 months. 4)  continue insulin 15 units am and 6 units pm.   5)  (update: i left message on phone-tree:  rx as we discussed)

## 2010-04-11 NOTE — Letter (Signed)
Summary: [facility]   [facility]   Imported By: Lennie Odor 08/16/2009 14:15:51  _____________________________________________________________________  External Attachment:    Type:   Image     Comment:   External Document

## 2010-04-11 NOTE — Consult Note (Signed)
Summary: diabetic retinopathy/[facility]  diabetic retinopathy/[facility]   Imported By: Lester Cicero 11/21/2007 15:08:11  _____________________________________________________________________  External Attachment:    Type:   Image     Comment:   External Document

## 2010-04-11 NOTE — Assessment & Plan Note (Signed)
Summary: 3 MTH FU-STC   Vital Signs:  Patient Profile:   77 Years Old Female Weight:      204.2 pounds Temp:     97.5 degrees F oral Pulse rate:   60 / minute BP sitting:   159 / 70  (right arm) Cuff size:   regular  Vitals Entered By: Orlan Leavens (Aug 05, 2007 8:01 AM)                 PCP:  Dr. Alwyn Ren  Chief Complaint:  3 month follow-up/ req somehting for allergies and samples of lipitor.  History of Present Illness: pt says bp elevation is situational pt takes novolin 70/30, 18 units qam and 5 qpm.  has occ mild hypoglycemia before lunch.  patient brings a record of her home glucoses, check mostly in the morning. She has a few readings in the afternoon or at bedtime. Her readings range from the high double digits to the mid 100s, with no pattern. patient says she does not notice her goiter.     Current Allergies: ! CODEINE  Past Medical History:    Reviewed history from 11/26/2006 and no changes required:       Hypertension       Coronary artery disease       Diabetes mellitus, type II     Review of Systems  The patient denies syncope.         denies difficulty swallowing   Physical Exam  General:     well developed, well nourished, in no acute distress Neck:     i feel thyroid might be slightly enlarged, but i can't tell details due to obesity Pulses:     dorsalis pedis intact bilat.  Extremities:     no deformity.  no ulcer on the feet.  feet are of normal color and temp. trace left pedal edema and trace right pedal edema.   Neurologic:     sensation is intact to touch on the feet  Additional Exam:     i rechecked bp--160/85  A1C%                 [H]  8.4 %                       4.6-6.0   FastTSH                   1.24 uIU/mL        Impression & Recommendations:  Problem # 1:  HYPERTENSION (ICD-401.9) rx limited by denial Her updated medication list for this problem includes:    Amlodipine Besylate 5 Mg Tabs (Amlodipine besylate) .Marland Kitchen...  Take 1 by mouth daily    Quinapril Hcl 40 Mg Tabs (Quinapril hcl)   Problem # 2:  DIABETES MELLITUS, TYPE II (ICD-250.00) this appears to be the best control this pt can achieve Her updated medication list for this problem includes:    Novolin 70/30 70-30 % Susp (Insulin isophane & regular) .Marland KitchenMarland KitchenMarland KitchenMarland Kitchen 18 units qam 5 units qpm  Orders: TLB-A1C / Hgb A1C (Glycohemoglobin) (83036-A1C) Est. Patient Level IV (16109)   Problem # 3:  GOITER, MULTINODULAR (ICD-241.1)  Orders: TLB-TSH (Thyroid Stimulating Hormone) (84443-TSH)    Patient Instructions: 1)  same rx for dm 2)  pt declines increased rx for htn 3)  follow the goiter for now 4)  ret 6 mos   Prescriptions: NOVOLIN 70/30 70-30 %  SUSP (INSULIN ISOPHANE & REGULAR) 18  units qam 5 units qpm  #1 x 11   Entered and Authorized by:   Minus Breeding MD   Signed by:   Minus Breeding MD on 08/05/2007   Method used:   Electronically sent to ...       [facility]       [address]       [place], [place]  [address]       Ph: [phone]       Fax: [phone]   RxID:   [id number]  ]

## 2010-04-11 NOTE — Progress Notes (Signed)
Summary: RECENT LABS  Phone Note Outgoing Call Call back at [facility] Phone [phone]   Call placed by: Shonna Chock,  September 15, 2007 1:33 PM Call placed to: Patient Details for Reason: **RECENT LABS** Summary of Call: COPY OF LABS MAILED TO PATIENT,LEFT MESSAGE ON MACHINE. REASON FOR CALL WAS TO TELL PATIENT OVERALL LABS NORMAL, MILD ANEMIA. DUE TO MILD ANEMIA DR.HOPPER WOULD LIKE FOR PATIENT TO COME IN FOR ADDITIONAL  LABS WITH IN THE NEXT WEEK- (IRON STUDIES) CBCD WITH SERUM B12,FOLATE,IRON/ IRON BINDING CAPACITY 285.9.  Chrae Malloy  September 15, 2007 1:36 PM   Follow-up for Phone Call        D/W patient, patient ok'd information. Scheduled appointment for labs. Follow-up by: Shonna Chock,  September 15, 2007 1:57 PM

## 2010-04-11 NOTE — Letter (Signed)
Summary: Results Follow up Letter  [facility]  [address] [place], [place] [address]   Phone: [phone]  Fax: [phone]    09/15/2007 MRN: [id number]  Rebecca [name] [address] [place], [place]  [address]  Dear Ms. Brocksmith,  The following are the results of your recent test(s):  Test         Result    Pap Smear:        Normal _____  Not Normal _____ Comments: ______________________________________________________ Cholesterol: LDL(Bad cholesterol):         Your goal is less than:         HDL (Good cholesterol):       Your goal is more than: Comments:  ______________________________________________________ Mammogram:        Normal _____  Not Normal _____ Comments:  ___________________________________________________________________ Hemoccult:        Normal _____  Not normal _______ Comments:    _____________________________________________________________________ Other Tests: PLEASE SEE ATTACHED LABS DONE ON 09/02/2007 AND APPEND (DR.'S COMMENTS)    We routinely do not discuss normal results over the telephone.  If you desire a copy of the results, or you have any questions about this information we can discuss them at your next office visit.   Sincerely,

## 2010-04-11 NOTE — Assessment & Plan Note (Signed)
Summary: HOSPITAL FU/KDC   Vital Signs:  Patient profile:   77 year old female Weight:      206.6 pounds Temp:     98.5 degrees F oral Pulse rate:   60 / minute Resp:     17 per minute BP sitting:   130 / 70  (left arm) Cuff size:   large  Vitals Entered By: Shonna Chock (April 28, 2009 9:13 AM) CC: Hospital Follow-up , Palpitations Comments REVIEWED MED LIST, PATIENT AGREED DOSE AND INSTRUCTION CORRECT      Primary Care Provider:  Dr. Alwyn Ren  CC:  Hospital Follow-up  and Palpitations.  History of Present Illness: Since D/C  02/12/2011she has had ongoing symptoms, mainly palpitations but  some dull mid chest pain with back radiation. D/C Summary reviewed.  The patient reports presyncope, chest pain, and shortness of breath, but denies dizziness, syncope, and throat tightness.  Associated symptoms include  nausea,diaphoresis and shortness of breath.  The patient denies the following symptoms: blurred vision, numbness, weakness,  weight loss, and heat intolerance.  The palpitations are described as a sensation of rapid heart fluttering.  The palpitations are sudden in onset and occur &  are worse with exercise.         The patient denies resting chest pain, vomiting, and indigestion.  The pain is described as intermittent and dull.  The pain is located in the substernal area.  The pain radiates to the back.  Episodes of chest pain last 2-5 minutes usually but up to  20-30 minutes.  The pain is brought on or made worse by strenuous activity and emotional stress. Her sister  for whom she cared died 27-Mar-2009 from ? renal failure. Similar symptoms @ husband's death 38; Prozac helped. Xanax of no benefit. The pain is relieved or improved , but not relieved  with NTG.  Appt will  be scheduled by Dr Rosalyn Charters office.                     FBS 109-230(after D/C); she was on SSI in hospital.                                                                                                                   Allergies: 1)  ! Codeine  Review of Systems Derm:  Complains of dryness; denies changes in nail beds and hair loss. Psych:  Complains of anxiety, depression, easily angered, easily tearful, and irritability.  Physical Exam  General:  well-nourished,in no acute distress; alert,appropriate and cooperative throughout examination Neck:  No deformities, masses, or tenderness noted. Lungs:  Normal respiratory effort, chest expands symmetrically. Lungs are clear to auscultation, no crackles or wheezes. Heart:  Normal rate and regular rhythm. S1 and S2 normal without gallop, murmur, click, rub . Pulses:  R and L carotid,radial,dorsalis pedis and posterior tibial pulses are full and equal bilaterally Neurologic:   DTRs symmetrical but 0-1/2+ Skin:  Intact without suspicious lesions or rashes Psych:  Oriented X3, flat affect, but  subdued.     Impression & Recommendations:  Problem # 1:  PALPITATIONS (ICD-785.1)  Problem # 2:  CHEST PAIN (ICD-786.50)  Problem # 3:  DEPRESSIVE DISORDER (ICD-311)  Her updated medication list for this problem includes:    Alprazolam 0.5 Mg Tabs (Alprazolam) .Marland Kitchen... 1/2-1 by mouth every 6 hours as needed    Fluoxetine Hcl 20 Mg Caps (Fluoxetine hcl) .Marland Kitchen... 1 once daily  Orders: Prescription Created Electronically [id number])  Complete Medication List: 1)  Amlodipine Besylate 10 Mg Tabs (Amlodipine besylate) .Marland Kitchen.. 1 by mouth once daily (dose was increased in the hospital) 2)  Novolin 70/30 70-30 % Susp (Insulin isophane & regular) .... 20 units qam 4 units qpm 3)  Quinapril Hcl 40 Mg Tabs (Quinapril hcl) .Marland Kitchen.. 1 by mouth once daily 4)  Accusure Insulin Syringe 31g X 5/16" 1 Ml Misc (Insulin syringe-needle u-100) .... Two times a day  size and brand of patient's choice 5)  Imdur 30 Mg Tb24 (Isosorbide mononitrate) .Marland Kitchen.. 1 by mouth qd 6)  Omeprazole 20 Mg Cpdr (Omeprazole) .... Take 1 by mouth q am 7)  Lipitor 10 Mg Tabs (Atorvastatin calcium) .... Take 1 by  mouth once daily 8)  Nitrostat 0.4 Mg Subl (Nitroglycerin) .... Use prn 9)  Hydrochlorothiazide 12.5 Mg Caps (Hydrochlorothiazide) .Marland Kitchen.. 1 once daily prn 10)  Aspirin Adult Low Strength 81 Mg Tbec (Aspirin) .Marland Kitchen.. 1 by mouth once daily 11)  Propoxyphene N-apap 100-650 Mg Tabs (Propoxyphene n-apap) .Marland Kitchen.. 1 by mouth every 8-12 hours 12)  Calcium 600 1500 Mg Tabs (Calcium carbonate) .... 2 by mouth once daily 13)  Vitamin E Complex 400 Unit Caps (Vitamin e) .Marland Kitchen.. 1 by mouth once daily 14)  Cod Liver Oil Caps (Cod liver oil) .Marland Kitchen.. 1 by mouth once daily durning the winter 15)  Vitamin C 500 Mg Tabs (Ascorbic acid) .Marland Kitchen.. 1 by mouth twice daily 16)  Vitamin D 5000 Unit Tabs (cholecalciferol)  .Marland Kitchen.. 1 by mouth once daily 17)  Multivitamins Tabs (Multiple vitamin) .Marland Kitchen.. 1 by mouth once daily 18)  Alprazolam 0.5 Mg Tabs (Alprazolam) .... 1/2-1 by mouth every 6 hours as needed 19)  Fluoxetine Hcl 20 Mg Caps (Fluoxetine hcl) .Marland Kitchen.. 1 once daily  Patient Instructions: 1)  Use the $4 plan @ [facility] for Fluoxetine Prescriptions: FLUOXETINE HCL 20 MG CAPS (FLUOXETINE HCL) 1 once daily  #30 x 5   Entered and Authorized by:   Marga Melnick MD   Signed by:   Marga Melnick MD on 04/28/2009   Method used:   F[facility]ed to ...       [facility]       [address]       [place], [place]  [address]       Ph: [phone]       Fax: [phone]   RxID:   [id number]

## 2010-04-11 NOTE — Assessment & Plan Note (Signed)
Summary: 3 MTH FU-STC   Vital Signs:  Patient Profile:   77 Years Old Female Weight:      203 pounds Temp:     97.4 degrees F oral Pulse rate:   60 / minute BP sitting:   164 / 71  (left arm) Cuff size:   regular  Vitals Entered By: Orlan Leavens (May 05, 2007 8:33 AM)                 PCP:  Dr. Alwyn Ren  Chief Complaint:  req sample of lipitor.  History of Present Illness: pt takes took more insulin during a recent illness. but has returned to novolin 70/30 18 unis qam, 5 qpm.  except for recent viral illnesses, feel well in general.   she brings with her a record of her cbg's which i have reviewed today.    Current Allergies: ! CODEINE  Past Medical History:    Reviewed history from 11/26/2006 and no changes required:       Hypertension       Coronary artery disease       Diabetes mellitus, type II     Review of Systems       has hypoglycemia only with exceeding rx'ed insulin dosage   Physical Exam  General:     obese.   Skin:     insulin injection sited (ant abdomen) normal    Impression & Recommendations:  Problem # 1:  DIABETES MELLITUS, TYPE II (ICD-250.00)  Her updated medication list for this problem includes:    Novolin 70/30 70-30 % Susp (Insulin isophane & regular) .Marland KitchenMarland KitchenMarland KitchenMarland Kitchen 18 units qam 5 units qpm  Orders: TLB-A1C / Hgb A1C (Glycohemoglobin) (83036-A1C) Est. Patient Level III (25956)   Medications Added to Medication List This Visit: 1)  Omeprazole 20 Mg Cpdr (Omeprazole) .... Take 1 by mouth q am 2)  Lipitor 10 Mg Tabs (Atorvastatin calcium) .... Take 1 by mouth qd 3)  Nitrostat 0.4 Mg Subl (Nitroglycerin) .... Use prn   Patient Instructions: 1)  continus novolin 70/30  18 qam   5 qpm 2)  ret 3 mos 3)  a1c today    ]

## 2010-04-11 NOTE — Assessment & Plan Note (Signed)
Summary: ro4--flu and be check for her anemia//ph   Vital Signs:  Patient Profile:   77 Years Old Female Weight:      201.0 pounds Temp:     98.1 degrees F oral Pulse rate:   58 / minute Resp:     16 per minute BP sitting:   138 / 70  (left arm) Cuff size:   large  Pt. in pain?   yes    Location:   L ankle    Intensity:   10  or <    Type:       throbbing  Vitals Entered By: Shonna Chock (January 01, 2008 3:20 PM)                  PCP:  Dr. Alwyn Ren  Chief Complaint:  1.) Left Ankle pain  2.) Follow-Up on Anemia  3.) Flu vaccine.  History of Present Illness: Ankle pain for years intermittently ,worse walking due to pes planus.She saw Podiatrist; surgery recommended but declined. ZO:[id number] helps "some". No PMH of injury or gout.                                           Anemia: labs & stool cards reviewed. Hct was 34.4 in7/09 with neg FOB & normal B12/folate & iron studies. PMH of Endo:ERD; colonoscopy neg.                                              Current Allergies (reviewed today): ! CODEINE  Past Medical History:    Hypertension    Coronary artery disease    Diabetes mellitus, type II; Multinodular goiter; Sickle Cell Trait  Past Surgical History:    PTCA/stent    Laser Rx, Retina ; Thyroid biopsy by Dr Everardo All for nodule : neg   Family History:    Family History Hypertension    Family History Lung cancer    Family History heart disease ; Gout : 2 bro & sister    Review of Systems  General      Denies fatigue and weight loss.  ENT      Denies difficulty swallowing, hoarseness, and nosebleeds.  GI      Denies abdominal pain, bloody stools, dark tarry stools, and indigestion.  GU      Denies hematuria.  MS      Complains of joint pain and joint swelling.      Denies joint redness.  Heme      Denies abnormal bruising and bleeding.   Physical Exam  General:     in no acute distress; alert,appropriate and cooperative throughout  examination;overweight-appearing.   Eyes:     No corneal or conjunctival inflammation noted. Perrla. No icterus Mouth:     Dentures;pharynx pink and moist.   Heart:     normal rate, regular rhythm, no gallop, no rub, no JVD, no HJR, and grade 1 /6 systolic murmur.   Abdomen:     Bowel sounds positive,abdomen soft and non-tender without masses, organomegaly or hernias noted. Pulses:     R and L dorsalis pedis and posterior tibial pulses are full and equal bilaterally Extremities:     traceL pedal edema.  Crepitus with ROM L ankle Skin:  Intact without suspicious lesions or rashesNo jaundice (AfAm) Cervical Nodes:     No lymphadenopathy noted Axillary Nodes:     No palpable lymphadenopathy Psych:     memory intact for recent and remote, normally interactive, and good eye contact.      Impression & Recommendations:  Problem # 1:  ANKLE PAIN, LEFT (ICD-719.47)  Orders: Venipuncture (16109) TLB-Uric Acid, Blood (84550-URIC)   Problem # 2:  UNSPECIFIED ANEMIA (ICD-285.9)  Orders: TLB-CBC Platelet - w/Differential (85025-CBCD)   Problem # 3:  SICKLE-CELL TRAIT (ICD-282.5)  Orders: Venipuncture (60454) TLB-CBC Platelet - w/Differential (85025-CBCD)   Problem # 4:  IDDM (ICD-250.01) as per Dr Everardo All Her updated medication list for this problem includes:    Novolin 70/30 70-30 % Susp (Insulin isophane & regular) .Marland KitchenMarland KitchenMarland KitchenMarland Kitchen 18 units qam 5 units qpm    Quinapril Hcl 40 Mg Tabs (Quinapril hcl) .Marland Kitchen... 1 by mouth once daily    Aspirin Adult Low Strength 81 Mg Tbec (Aspirin) .Marland Kitchen... 1 by mouth once daily  Orders: Venipuncture (09811)   Problem # 5:  GOITER, MULTINODULAR (ICD-241.1) as per Dr Everardo All Orders: Venipuncture (91478)   Complete Medication List: 1)  Amlodipine Besylate 5 Mg Tabs (Amlodipine besylate) .... Take 1 by mouth daily 2)  Novolin 70/30 70-30 % Susp (Insulin isophane & regular) .Marland Kitchen.. 18 units qam 5 units qpm 3)  Quinapril Hcl 40 Mg Tabs (Quinapril hcl)  .Marland Kitchen.. 1 by mouth once daily 4)  Accusure Insulin Syringe 31g X 5/16" 1 Ml Misc (Insulin syringe-needle u-100) .... Two times a day  size and brand of patient's choice 5)  Imdur 30 Mg Tb24 (Isosorbide mononitrate) .Marland Kitchen.. 1 by mouth qd 6)  Omeprazole 20 Mg Cpdr (Omeprazole) .... Take 1 by mouth q am 7)  Lipitor 10 Mg Tabs (Atorvastatin calcium) .... Take 1 by mouth qd 8)  Nitrostat 0.4 Mg Subl (Nitroglycerin) .... Use prn 9)  Hydrochlorothiazide 12.5 Mg Caps (Hydrochlorothiazide) .Marland Kitchen.. 1 qd 10)  Ultracet 37.5-325 Mg Tabs (Tramadol-acetaminophen) .Marland Kitchen.. 1 q4h as needed pain 11)  Aspirin Adult Low Strength 81 Mg Tbec (Aspirin) .Marland Kitchen.. 1 by mouth once daily 12)  Colchicine 0.6 Mg Tabs (Colchicine) .Marland Kitchen.. 1 bid  Other Orders: Flu Vaccine 27yrs + (29562) Administration Flu vaccine (Z3086)   Patient Instructions: 1)  To prevent gout attacks,avoid purine rich foods, such as beer, beans & peas, and meat gravies. Avoid High Fructose Corn Syrup in foods & drinks.   Prescriptions: COLCHICINE 0.6 MG TABS (COLCHICINE) 1 bid  #30 x 0   Entered and Authorized by:   Marga Melnick MD   Signed by:   Marga Melnick MD on 01/01/2008   Method used:   Print then Give to Patient   RxID:   [id number]  ] Flu Vaccine Consent Questions     Do you have a history of severe allergic reactions to this vaccine? no    Any prior history of allergic reactions to egg and/or gelatin? no    Do you have a sensitivity to the preservative Thimersol? no    Do you have a past history of Guillan-Barre Syndrome? no    Do you currently have an acute febrile illness? no    Have you ever had a severe reaction to latex? no    Vaccine information given and explained to patient? yes    Are you currently pregnant? no    Lot Number:[id number]   Exp Date:09/08/2008   Site Given  Left Deltoid IM

## 2010-04-11 NOTE — Assessment & Plan Note (Signed)
Summary: 3 MTH FU-$50-STC   Vital Signs:  Patient Profile:   77 Years Old Female Weight:      203.0 pounds Temp:     97.2 degrees F oral Pulse rate:   56 / minute BP sitting:   128 / 62  (left arm) Cuff size:   regular  Pt. in pain?   no  Vitals Entered By: Orlan Leavens (November 06, 2007 8:11 AM)                  PCP:  Dr. Alwyn Ren  Chief Complaint:  3 MONTH FOLLOW-UP.  History of Present Illness: pt has multiple sxs unrelated to her dm.  recently has left ankle pain.  no injury. rcently has nasal congestion and rhinorrhea she brings a record of her cbg's.  almost all are in am (70-120), but a few are at hs (100-140).    Current Allergies: ! CODEINE  Past Medical History:    Reviewed history from 11/26/2006 and no changes required:       Hypertension       Coronary artery disease       Diabetes mellitus, type II     Review of Systems  The patient denies hypoglycemia and fever.     Physical Exam  General:     well developed, well nourished, in no acute distress Msk:     left ankly normal to my exam Extremities:     left lat malleolus is slightly swollen, but no tenderness no warmth Skin:     insulin injection sites at anterior abdomen are normal  Additional Exam:     A1C%                 [H]  7.8 %       Impression & Recommendations:  Problem # 1:  IDDM (ICD-250.01)  Problem # 2:  left ankle pain uncertain etiology  Problem # 3:  allergic rhinitis  Medications Added to Medication List This Visit: 1)  Ultracet 37.5-325 Mg Tabs (Tramadol-acetaminophen) .Marland Kitchen.. 1 q4h as needed pain   Patient Instructions: 1)  chek cbg once daily, vary time among ac and hs 2)  see dr hopper for your primary care needs. 3)  ultracet 4)  x ray refused 5)  nasonex 2 spray each side once daily (samples) 6)  ret 3 mos   Prescriptions: ULTRACET 37.5-325 MG TABS (TRAMADOL-ACETAMINOPHEN) 1 q4h as needed pain  #50 x 0   Entered and Authorized by:   Minus Breeding  MD   Signed by:   Minus Breeding MD on 11/06/2007   Method used:   Electronically to        [facility] (retail)       [address]       [place], [place]  [address]       Ph: [phone]       Fax: [phone]   RxID:   [email or web address]  ]

## 2010-04-11 NOTE — Assessment & Plan Note (Signed)
Summary: 3 mo rov /nws  #   Vital Signs:  Patient profile:   77 year old female Height:      65 inches (165.10 cm) Weight:      202.12 pounds (91.87 kg) O2 Sat:      97 % on Room air Temp:     97.9 degrees F (36.61 degrees C) oral Pulse rate:   77 / minute BP sitting:   120 / 62  (left arm) Cuff size:   large  Vitals Entered By: Orlan Leavens (September 01, 2009 1:37 PM)  O2 Flow:  Room air CC: 3 month follow-up Is Patient Diabetic? Yes Did you bring your meter with you today? Yes Pain Assessment Patient in pain? no        Primary Provider:  Dr. Alwyn Ren  CC:  3 month follow-up.  History of Present Illness: due to hypoglycemia, pt reduced her insulin to 15 units am and 0-12 units in the afternoon.  she brings a record of her cbg's which i have reviewed today.  it varies from 85 (afternoon) to 150 (hs).  pt states she feels well in general.  Current Medications (verified): 1)  Amlodipine Besylate 10 Mg Tabs (Amlodipine Besylate) .Marland Kitchen.. 1 By Mouth Once Daily (Dose Was Increased in The Hospital) 2)  Novolin 70/30 70-30 %  Susp (Insulin Isophane & Regular) .... 22 Units Qam 6 Units Qpm 3)  Quinapril Hcl 40 Mg Tabs (Quinapril Hcl) .Marland Kitchen.. 1 By Mouth Once Daily 4)  Accusure Insulin Syringe 31g X 5/16" 1 Ml  Misc (Insulin Syringe-Needle U-100) .... Two Times A Day  Size and Brand of Patient's Choice 5)  Imdur 30 Mg  Tb24 (Isosorbide Mononitrate) .Marland Kitchen.. 1 By Mouth Qd 6)  Omeprazole 20 Mg  Cpdr (Omeprazole) .... Take 1 By Mouth Q Am 7)  Lipitor 10 Mg  Tabs (Atorvastatin Calcium) .... Take 1 By Mouth Once Daily 8)  Nitrostat 0.4 Mg  Subl (Nitroglycerin) .Marland Kitchen.. 1 By Mouth As Directed For Chest Pain 9)  Hydrochlorothiazide 12.5 Mg  Caps (Hydrochlorothiazide) .Marland Kitchen.. 1 Once Daily Prn 10)  Aspirin Adult Low Strength 81 Mg Tbec (Aspirin) .Marland Kitchen.. 1 By Mouth Once Daily 11)  Calcium 600 1500 Mg Tabs (Calcium Carbonate) .... 2 By Mouth Once Daily 12)  Vitamin E Complex 400 Unit Caps (Vitamin E) .Marland Kitchen.. 1 By Mouth  Once Daily 13)  Cod Liver Oil  Caps (Cod Liver Oil) .Marland Kitchen.. 1 By Mouth Once Daily Durning The Winter 14)  Vitamin C 500 Mg Tabs (Ascorbic Acid) .Marland Kitchen.. 1 By Mouth Twice Daily 15)  Vitamin D 5000 Unit Tabs (Cholecalciferol) .Marland Kitchen.. 1 By Mouth Once Daily 16)  Multivitamins  Tabs (Multiple Vitamin) .Marland Kitchen.. 1 By Mouth Once Daily 17)  Fluoxetine Hcl 20 Mg Caps (Fluoxetine Hcl) .Marland Kitchen.. 1 Once Daily  Allergies (verified): 1)  ! Codeine  Past History:  Past Medical History: Last updated: 08/02/2008 CORONARY ARTERY DISEASE (ICD-414.00) HYPERCHOLESTEROLEMIA (ICD-272.0) HYPERTENSION (ICD-401.9) SICKLE-CELL TRAIT (ICD-282.5) UNSPECIFIED ANEMIA (ICD-285.9) ANKLE PAIN, LEFT (ICD-719.47) ESOPHAGEAL REFLUX (ICD-530.81) GOITER, MULTINODULAR (ICD-241.1) HIATAL HERNIA (ICD-553.3) IDDM (ICD-250.01) -- type II; Multinodular goiter; Sickle Cell Trait FIBROCYSTIC BREAST DISEASE (ICD-610.1)  Review of Systems  The patient denies syncope, weight loss, and weight gain.    Physical Exam  General:  obese.  no distress Pulses:  dorsalis pedis intact on the right foot.  no carotid bruit  Extremities:  right foot:  no deformity.  no ulcer.  right foot is of normal color and temp.  no edema. Neurologic:  sensation is intact to touch on the right foot.  Additional Exam:  Hemoglobin A1C       [H]  7.2 %   Impression & Recommendations:  Problem # 1:  IDDM (ICD-250.01) therapy limited by pt's request for least expensive meds, and her need for a simple regimen.    Medications Added to Medication List This Visit: 1)  Novolin 70/30 70-30 % Susp (Insulin isophane & regular) .Marland Kitchen.. 15 units qam 6 units qpm  Other Orders: TLB-A1C / Hgb A1C (Glycohemoglobin) (83036-A1C) T-Fructosamine ([address]) Est. Patient Level III ([address])  Patient Instructions: 1)  tests are being ordered for you today.  a few days after the test(s), please call [phone] to hear your test results. 2)  check your blood sugar 1-2 times a day.  vary  the time of day when you check, between before the 3 meals, and at bedtime.  also check if you have symptoms of your blood sugar being too high or too low.  please keep a record of the readings and bring it to your next appointment here.  please call us sooner if you are having low blood sugar episodes. 3)  Please schedule a follow-up appointment in 3 months. 4)  continue insulin 15 units am and 6 units pm.   5)  (update: i left message on phone-tree:  rx as we discussed)

## 2010-04-11 NOTE — Assessment & Plan Note (Signed)
Summary: Cardiology Nuclear Study  Nuclear Med Background Indications for Stress Test: Evaluation for Ischemia, Stent Patency, PTCA Patency, Post Hospital  Indications Comments: 2/9-2/12/11 chest pain, negative enzymes  History: Angioplasty, Asthma, Echo, Myocardial Perfusion Study, Stents  History Comments: '99 PTCA/Stent-Cfx/RI; '04 MPS:no ischemia, EF=76%; 04/21/09 Echo:EF=60-65%  Symptoms: Chest Pain, Chest Pain with Exertion, DOE, Fatigue, Near Syncope, Palpitations, SOB    Nuclear Pre-Procedure Cardiac Risk Factors: Family History - CAD, Hypertension, IDDM Type 1, Lipids Caffeine/Decaff Intake: None NPO After: 9:00 PM Lungs: clear IV 0.9% NS with Angio Cath: 22g     IV Site: (R) Wrist IV Started by: Irean Hong RN Chest Size (in) 42     Cup Size D     Height (in): 65 Weight (lb): 204 BMI: 34.07  Nuclear Med Study 1 or 2 day study:  1 day     Stress Test Type:  Stress Reading MD:  Arvilla Meres, MD     Referring MD:  T.Stuckey Resting Radionuclide:  Technetium 93m Tetrofosmin     Resting Radionuclide Dose:  11.0 mCi  Stress Radionuclide:  Technetium 45m Tetrofosmin     Stress Radionuclide Dose:  32.0 mCi   Stress Protocol Exercise Time (min):  4:00 min     Max HR:  139 bpm     Predicted Max HR:  146 bpm       Percent Max HR:  95.21 %     METS: 5.8    Stress Test Technologist:  Milana Na EMT-P     Nuclear Technologist:  Burna Mortimer Deal RT-N  Rest Procedure  Myocardial perfusion imaging was performed at rest 45 minutes following the intravenous administration of Myoview Technetium 66m Tetrofosmin.  Stress Procedure  The patient exercised for 4:00. The patient stopped due to fatigue, sob, and denied any chest pain.  There were non specific ST-T wave changes and occ pvcs.  Myoview was injected at peak exercise and myocardial perfusion imaging was performed after a brief delay.  QPS Raw Data Images:  Normal; no motion artifact; normal heart/lung ratio. Stress  Images:  There is normal uptake in all areas. Rest Images:  There is mildly decreased uptake in the anterior wall. Subtraction (SDS):  No evidence of ischemia. Transient Ischemic Dilatation:  .94  (Normal <1.22)  Lung/Heart Ratio:  .32  (Normal <0.45)  Quantitative Gated Spect Images QGS EDV:  69 ml QGS ESV:  15 ml QGS EF:  78 % QGS cine images:  Normal  Findings Normal nuclear study      Overall Impression  Exercise Capacity: Poor exercise capacity. BP Response: Normal blood pressure response. Clinical Symptoms: There is dyspnea. ECG Impression: No significant ST segment change suggestive of ischemia. Overall Impression: Normal stress nuclear study.  Appended Document: Cardiology Nuclear Study let her know stress looks good, but she should keep her followup.  TS  Appended Document: Cardiology Nuclear Study Pt. aware of stress test result. Pt. will keep F/U appointment with Dr. Riley Kill.

## 2010-04-11 NOTE — Letter (Signed)
Summary: Results Follow up Letter  [facility]  [address] [place], [place] [address]   Phone: [phone]  Fax: [phone]    02/08/2009 MRN: [id number]  Rebecca [name] [address] [place], [place]  [address]  Dear Ms. Claxton,  The following are the results of your recent test(s):  Test         Result    Pap Smear:        Normal _____  Not Normal _____ Comments: ______________________________________________________ Cholesterol: LDL(Bad cholesterol):         Your goal is less than:         HDL (Good cholesterol):       Your goal is more than: Comments:  ______________________________________________________ Mammogram:        Normal _____  Not Normal _____ Comments:  ___________________________________________________________________ Hemoccult:        Normal _____  Not normal _______ Comments:    _____________________________________________________________________ Other Tests: Please see attached labs checked on 02/07/2009    We routinely do not discuss normal results over the telephone.  If you desire a copy of the results, or you have any questions about this information we can discuss them at your next office visit.   Sincerely,

## 2010-04-11 NOTE — Progress Notes (Signed)
Summary: [facility]  Phone Note Outgoing Call   Call placed by: Shonna Chock,  April 28, 2008 1:24 PM Call placed to: Patient Summary of Call: MESSAGE LEFT ON VOICEMAIL: VERIFY PAPER RECIEVED FOR DIABETIC SUPPLIES./Rebecca [name]  April 28, 2008 1:25 PM   Follow-up for Phone Call        SPOKE WITH DIABETIC SUPPLY COMPANY, NO FORMED RECEIVED. FORM WILL BE REFAXED. Follow-up by: Shonna Chock,  April 29, 2008 4:04 PM

## 2010-04-11 NOTE — Letter (Signed)
Summary: [facility]   Imported By: Lester [place] 11/19/2008 08:36:30  _____________________________________________________________________  External Attachment:    Type:   Image     Comment:   External Document

## 2010-04-11 NOTE — Assessment & Plan Note (Signed)
Summary: 3 mth fu---$50---stc   Vital Signs:  Patient Profile:   77 Years Old Female Weight:      198.8 pounds O2 Sat:      96 % O2 treatment:    Room Air Temp:     97.6 degrees F oral Pulse rate:   57 / minute BP sitting:   136 / 70  (left arm) Cuff size:   regular  Pt. in pain?   no  Vitals Entered By: Orlan Leavens (January 29, 2008 8:10 AM)                  PCP:  Dr. Alwyn Ren  Chief Complaint:  3 month follow-up.  History of Present Illness: pt has occasional hypoglycemia (mild) before breakfast. it can also be low at lunch if lunch is delayed.  other than this and arthralgias, she feels well.  she brings a record of her cbg's which i have reviewed today.    Prior Medications Reviewed Using: Medication Bottles  Updated Prior Medication List: AMLODIPINE BESYLATE 5 MG  TABS (AMLODIPINE BESYLATE) take 1 by mouth daily NOVOLIN 70/30 70-30 %  SUSP (INSULIN ISOPHANE & REGULAR) 18 units qam 5 units qpm QUINAPRIL HCL 40 MG TABS (QUINAPRIL HCL) 1 by mouth once daily ACCUSURE INSULIN SYRINGE 31G X 5/16" 1 ML  MISC (INSULIN SYRINGE-NEEDLE U-100) two times a day  size and brand of patient's choice IMDUR 30 MG  TB24 (ISOSORBIDE MONONITRATE) 1 by mouth qd OMEPRAZOLE 20 MG  CPDR (OMEPRAZOLE) take 1 by mouth q am LIPITOR 10 MG  TABS (ATORVASTATIN CALCIUM) take 1 by mouth qd NITROSTAT 0.4 MG  SUBL (NITROGLYCERIN) use prn HYDROCHLOROTHIAZIDE 12.5 MG  CAPS (HYDROCHLOROTHIAZIDE) 1 once daily prn ULTRACET 37.5-325 MG TABS (TRAMADOL-ACETAMINOPHEN) 1 q4h as needed pain ASPIRIN ADULT LOW STRENGTH 81 MG TBEC (ASPIRIN) 1 by mouth once daily  Current Allergies (reviewed today): ! CODEINE  Past Medical History:    Reviewed history from 01/01/2008 and no changes required:       Hypertension       Coronary artery disease       Diabetes mellitus, type II; Multinodular goiter; Sickle Cell Trait     Review of Systems  The patient denies syncope.     Physical Exam  General:     well  developed, well nourished, in no acute distress Skin:     insulin injection sites at anterior abdomen are normal  Additional Exam:     A1C%                 [H]  7.6 %          Impression & Recommendations:  Problem # 1:  IDDM (ICD-250.01) because she cannot do a more complex regimen, this is the best control she can achieve.  Medications Added to Medication List This Visit: 1)  Novolin 70/30 70-30 % Susp (Insulin isophane & regular) .Marland Kitchen.. 18 units qam 4 units qpm 2)  Hydrochlorothiazide 12.5 Mg Caps (Hydrochlorothiazide) .Marland Kitchen.. 1 once daily prn   Patient Instructions: 1)  reduce novolin 70/30 to 18 units am and 4 units pm 2)  ret 3 mos   ]

## 2010-04-11 NOTE — Letter (Signed)
Summary: Results Follow up Letter  [facility]  [address] [place], [place] [address]   Phone: [phone]  Fax: [phone]    09/29/2007 MRN: [id number]  Rebecca [name] [address] [place], [place]  [address]  Dear [name],  The following are the results of your recent test(s):  Test         Result    Pap Smear:        Normal _____  Not Normal _____ Comments: ______________________________________________________ Cholesterol: LDL(Bad cholesterol):         Your goal is less than:         HDL (Good cholesterol):       Your goal is more than: Comments:  ______________________________________________________ Mammogram:        Normal _____  Not Normal _____ Comments:  ___________________________________________________________________ Hemoccult:        Normal _____  Not normal _______ Comments:    _____________________________________________________________________ Other Tests: PLEASE SEE ATTACHED LABS DONE ON 09/18/2007    We routinely do not discuss normal results over the telephone.  If you desire a copy of the results, or you have any questions about this information we can discuss them at your next office visit.   Sincerely,

## 2010-04-11 NOTE — Letter (Signed)
Summary: [facility] Requests for Lumbosacral Support, Vital Wrap & Aqua Therapy/  [facility]N Requests for Lumbosacral Support, Vital Wrap & Aqua Therapy/[facility]   Imported By: Lanelle Bal 09/30/2008 11:32:05  _____________________________________________________________________  External Attachment:    Type:   Image     Comment:   External Document

## 2010-04-11 NOTE — Progress Notes (Signed)
Summary: Ongoing Concerns  Phone Note Call from Patient Call back at Home Phone [phone]   Caller: Patient Summary of Call: Patient still with concerns about stiffness in legs in and feet, patient said the Dr. mentioned that labs are NOT worrisome but they are to her and she would like to know what to do about her Symptoms.  Dr.Hopper please futher advise./[facility]  November 19, 2008 9:39 AM   Follow-up for Phone Call        She will need to see Dr Lestine Box   again or a Rheumatologist, if she can't wear the shoes Kristine Garbe Dr Lestine Box Rxed Follow-up by: Marga Melnick MD,  November 19, 2008 3:19 PM  Additional Follow-up for Phone Call Additional follow up Details #1::        Left message on machine informing patient of above instruction, patient to call if any futher assistance needed. Additional Follow-up by: Shonna Chock,  November 19, 2008 4:31 PM

## 2010-04-11 NOTE — Assessment & Plan Note (Signed)
Summary: 3 MOS F/U #/CD   Vital Signs:  Patient profile:   77 year old female Height:      65 inches (165.10 cm) Weight:      204.13 pounds (92.79 kg) O2 Sat:      95 % on Room air Temp:     98.1 degrees F (36.72 degrees C) oral Pulse rate:   74 / minute BP sitting:   122 / 60  (left arm) Cuff size:   large  Vitals Entered By: Josph Macho RMA (May 24, 2009 1:32 PM)  O2 Flow:  Room air CC: 3 month follow up/ CF Is Patient Diabetic? No   Primary Provider:  Dr. Alwyn Ren  CC:  3 month follow up/ CF.  History of Present Illness: no cbg record, but states cbg's are sometimes low in the afternoon if her lunch is missed or delayed.  at other times of day.  otherwise, it is higher in the afternoon than in am.  it seldom reaches 200.  pt states she feels well in general.  Current Medications (verified): 1)  Amlodipine Besylate 10 Mg Tabs (Amlodipine Besylate) .Marland Kitchen.. 1 By Mouth Once Daily (Dose Was Increased in The Hospital) 2)  Novolin 70/30 70-30 %  Susp (Insulin Isophane & Regular) .... 20 Units Qam 4 Units Qpm 3)  Quinapril Hcl 40 Mg Tabs (Quinapril Hcl) .Marland Kitchen.. 1 By Mouth Once Daily 4)  Accusure Insulin Syringe 31g X 5/16" 1 Ml  Misc (Insulin Syringe-Needle U-100) .... Two Times A Day  Size and Brand of Patient's Choice 5)  Imdur 30 Mg  Tb24 (Isosorbide Mononitrate) .Marland Kitchen.. 1 By Mouth Qd 6)  Omeprazole 20 Mg  Cpdr (Omeprazole) .... Take 1 By Mouth Q Am 7)  Lipitor 10 Mg  Tabs (Atorvastatin Calcium) .... Take 1 By Mouth Once Daily 8)  Nitrostat 0.4 Mg  Subl (Nitroglycerin) .... Use Prn 9)  Hydrochlorothiazide 12.5 Mg  Caps (Hydrochlorothiazide) .Marland Kitchen.. 1 Once Daily Prn 10)  Aspirin Adult Low Strength 81 Mg Tbec (Aspirin) .Marland Kitchen.. 1 By Mouth Once Daily 11)  Propoxyphene N-Apap 100-650 Mg Tabs (Propoxyphene N-Apap) .Marland Kitchen.. 1 By Mouth Every 8-12 Hours 12)  Calcium 600 1500 Mg Tabs (Calcium Carbonate) .... 2 By Mouth Once Daily 13)  Vitamin E Complex 400 Unit Caps (Vitamin E) .Marland Kitchen.. 1 By Mouth Once  Daily 14)  Cod Liver Oil  Caps (Cod Liver Oil) .Marland Kitchen.. 1 By Mouth Once Daily Durning The Winter 15)  Vitamin C 500 Mg Tabs (Ascorbic Acid) .Marland Kitchen.. 1 By Mouth Twice Daily 16)  Vitamin D 5000 Unit Tabs (Cholecalciferol) .Marland Kitchen.. 1 By Mouth Once Daily 17)  Multivitamins  Tabs (Multiple Vitamin) .Marland Kitchen.. 1 By Mouth Once Daily 18)  Alprazolam 0.5 Mg Tabs (Alprazolam) .... 1/2-1 By Mouth Every 6 Hours As Needed 19)  Fluoxetine Hcl 20 Mg Caps (Fluoxetine Hcl) .Marland Kitchen.. 1 Once Daily  Allergies (verified): 1)  ! Codeine  Past History:  Past Medical History: Last updated: 08/02/2008 CORONARY ARTERY DISEASE (ICD-414.00) HYPERCHOLESTEROLEMIA (ICD-272.0) HYPERTENSION (ICD-401.9) SICKLE-CELL TRAIT (ICD-282.5) UNSPECIFIED ANEMIA (ICD-285.9) ANKLE PAIN, LEFT (ICD-719.47) ESOPHAGEAL REFLUX (ICD-530.81) GOITER, MULTINODULAR (ICD-241.1) HIATAL HERNIA (ICD-553.3) IDDM (ICD-250.01) -- type II; Multinodular goiter; Sickle Cell Trait FIBROCYSTIC BREAST DISEASE (ICD-610.1)  Review of Systems  The patient denies syncope.    Physical Exam  General:  normal appearance.   Skin:  insulin injection sites at anterior abdomen are normal  Additional Exam:  Hemoglobin A1C       [H]  8.3 %    Impression &  Recommendations:  Problem # 1:  IDDM (ICD-250.01) needs increased rx  Medications Added to Medication List This Visit: 1)  Novolin 70/30 70-30 % Susp (Insulin isophane & regular) .... 22 units qam 6 units qpm  Other Orders: TLB-A1C / Hgb A1C (Glycohemoglobin) (83036-A1C) Est. Patient Level III ([address])  Patient Instructions: 1)  tests are being ordered for you today.  a few days after the test(s), please call [phone] to hear your test results. 2)  pending the test results, please continue novolin 70/30 to 20 units am and 4 units pm. 3)  check your blood sugar 1-2 times a day.  vary the time of day when you check, between before the 3 meals, and at bedtime.  also check if you have symptoms of your blood sugar being  too high or too low.  please keep a record of the readings and bring it to your next appointment here.  please call us sooner if you are having low blood sugar episodes. 4)  Please schedule a follow-up appointment in 3 months. 5)  (update: i left message on phone-tree:  increase insulin to 22 units am and 6 units pm).

## 2010-04-11 NOTE — Assessment & Plan Note (Signed)
Summary: per pt 3 mth fu---$50--stc   Vital Signs:  Patient profile:   77 year old female Height:      65 inches Weight:      206 pounds BMI:     34.40 Temp:     97.2 degrees F oral Pulse rate:   63 / minute BP sitting:   126 / 60  (left arm) Cuff size:   regular  Vitals Entered By: Bill Salinas CMA (Jul 27, 2008 8:10 AM)  Primary Provider:  Dr. Alwyn Ren   History of Present Illness: no cbg record, but states cbg's vary from 80 (in am) up to 165 (afternoon).  she feels well in general.   she does not notice the goiter.  Current Medications (verified): 1)  Amlodipine Besylate 5 Mg  Tabs (Amlodipine Besylate) .... Take 1 By Mouth Daily 2)  Novolin 70/30 70-30 %  Susp (Insulin Isophane & Regular) .Marland Kitchen.. 19 Units Qam 5 Units Qpm 3)  Quinapril Hcl 40 Mg Tabs (Quinapril Hcl) .Marland Kitchen.. 1 By Mouth Once Daily 4)  Accusure Insulin Syringe 31g X 5/16" 1 Ml  Misc (Insulin Syringe-Needle U-100) .... Two Times A Day  Size and Brand of Patient's Choice 5)  Imdur 30 Mg  Tb24 (Isosorbide Mononitrate) .Marland Kitchen.. 1 By Mouth Qd 6)  Omeprazole 20 Mg  Cpdr (Omeprazole) .... Take 1 By Mouth Q Am 7)  Lipitor 10 Mg  Tabs (Atorvastatin Calcium) .... Take 1 By Mouth Qd 8)  Nitrostat 0.4 Mg  Subl (Nitroglycerin) .... Use Prn 9)  Hydrochlorothiazide 12.5 Mg  Caps (Hydrochlorothiazide) .Marland Kitchen.. 1 Once Daily Prn 10)  Aspirin Adult Low Strength 81 Mg Tbec (Aspirin) .Marland Kitchen.. 1 By Mouth Once Daily 11)  Meloxicam 15 Mg Tabs (Meloxicam) .... Take 1 By Mouth Qd 12)  Propoxyphene N-Apap 100-650 Mg Tabs (Propoxyphene N-Apap) .... Take 1 Q 4-6 Hours Prn  Allergies (verified): 1)  ! Codeine  Past History:  Past Medical History:    Hypertension    Coronary artery disease    Diabetes mellitus, type II; Multinodular goiter; Sickle Cell Trait     (01/01/2008)  Review of Systems  The patient denies hypoglycemia.    Physical Exam  General:  obese.   Skin:  insulin injection sites at anterior abdomen are normal  Additional Exam:   a1c=8.6   Impression & Recommendations:  Problem # 1:  IDDM (ICD-250.01)  Medications Added to Medication List This Visit: 1)  Novolin 70/30 70-30 % Susp (Insulin isophane & regular) .... 22 units qam 5 units qpm  Other Orders: TLB-TSH (Thyroid Stimulating Hormone) (84443-TSH) TLB-A1C / Hgb A1C (Glycohemoglobin) (83036-A1C) TLB-Microalbumin/Creat Ratio, Urine (82043-MALB) Est. Patient Level III ([address])  Patient Instructions: 1)  check your blood glucose 1 time a day.  vary the time of day between before the 3 meals and at bedtime.  also check if you feel as though your glucose might be very high or too low.  bring a record of this to your doctor appointments 2)  increase novolin 70/30 to 22 units am and 5 units pm. 3)  Please schedule a follow-up appointment in 6 weeks.

## 2010-04-11 NOTE — Progress Notes (Signed)
Summary: [facility]  Phone Note From Other Clinic   Summary of Call: Received fax from [facility]. Faxed back with MD's signature and paper filled out. Initial call taken by: Josph Macho CMA,  December 28, 2008 11:21 AM

## 2010-04-11 NOTE — Assessment & Plan Note (Signed)
Summary: diabetic check.cbs   Vital Signs:  Patient profile:   77 year old female Weight:      209 pounds Temp:     98.2 degrees F oral Pulse rate:   60 / minute Resp:     19 per minute BP sitting:   120 / 74  (left arm) Cuff size:   large  Vitals Entered By: Shonna Chock (August 12, 2008 9:33 AM) CC: DIABETIC FOLLOW-UP, DISCUSS THYROID, Lipid Management, Hypertension Management   Primary Care Provider:  Dr. Alwyn Ren  CC:  DIABETIC FOLLOW-UP, DISCUSS THYROID, Lipid Management, and Hypertension Management.  History of Present Illness: Dr George Hugh 07/27/08 OV & last A1c reviewed & risk discussed (A1c 8.6 = average sugar approx 200 & increased cardiovascular risk of 72%).Low glycemic Index & Load  & avoiding HFCS  in packaged foods & drinks discussed."Dr Everardo All said thyroid is overactive; will need pill. Biopsy in 2009 showed no cancer." No change in multinodular goiter on [place] in 3/10; TSH therapeutic 07/27/08. Lipid Management goals  reviewed  Hypertension History:      She denies headache, chest pain, palpitations, dyspnea with exertion, orthopnea, PND, peripheral edema, visual symptoms, neurologic problems, syncope, and side effects from treatment.  She notes no problems with any antihypertensive medication side effects.        Positive major cardiovascular risk factors include female age 58 years old or older, diabetes, hyperlipidemia, and hypertension.  Negative major cardiovascular risk factors include negative family history for ischemic heart disease and non-tobacco-user status.        Positive history for target organ damage include ASHD (either angina/prior MI/prior CABG).  Further assessment for target organ damage reveals no history of stroke/TIA or peripheral vascular disease.    Lipid Management History:      Positive NCEP/ATP III risk factors include female age 52 years old or older, diabetes, hypertension, and ASHD (either angina/prior MI/prior CABG).  Negative NCEP/ATP III risk  factors include HDL cholesterol greater than 60, no family history for ischemic heart disease, non-tobacco-user status, no prior stroke/TIA, no peripheral vascular disease, and no history of aortic aneurysm.      Allergies: 1)  ! Codeine  Family History: Family History Hypertension Family History Lung cancer Family History heart disease ; Gout : 2 bro & sister; F MI > 70; ? M MI  > 65  Review of Systems Eyes:  Denies blurring, double vision, and vision loss-both eyes; Exam 3/10 ; redcheck 9/10. CV:  Denies chest pain or discomfort. MS:  Fractured L ankle 12/09, Dr Lestine Box. LLE in ankle  support /wrap. Derm:  Denies poor wound healing. Neuro:  Denies numbness and tingling; No burning. Endo:  Denies excessive hunger, excessive thirst, and excessive urination.  Physical Exam  General:  in no acute distress; alert,appropriate and cooperative throughout examination;overweight-appearing.   Neck:  No deformities, masses, or tenderness noted. Thyroid asymmetry w/o palpable nodules clinically Lungs:  Normal respiratory effort, chest expands symmetrically. Lungs are clear to auscultation, no crackles or wheezes. Heart:  Normal rate and regular rhythm. S1 and S2 normal without gallop, murmur, click, rub. S4 Abdomen:  Bowel sounds positive,abdomen soft and non-tender without masses, organomegaly or hernias noted. No AAA or bruits Pulses:  R and L carotid,radial  pulses are full and equal bilaterally. Decreased pedal pulses. LLE pulses  not checked (in wrap) Extremities:  trace right pedal edema.   Neurologic:  alert & oriented X3 but she exhibits "la belle indifference " in reference to risk (  no emotional response to discussion of > 70% increased risk of "heart attack " or "stroke".   Skin:  Intact without suspicious lesions or rashes Psych:  memory intact for recent and remote, normally interactive, good eye contact, not anxious appearing, and not depressed appearing.     Impression &  Recommendations:  Problem # 1:  HYPERCHOLESTEROLEMIA (ICD-272.0)  Her updated medication list for this problem includes:    Lipitor 10 Mg Tabs (Atorvastatin calcium) .Marland Kitchen... Take 1 by mouth once daily**labs due now**  Orders: Venipuncture (16109) TLB-Lipid Panel (80061-LIPID) TLB-Hepatic/Liver Function Pnl (80076-HEPATIC)  Problem # 2:  HYPERTENSION (ICD-401.9)  Controlled Her updated medication list for this problem includes:    Amlodipine Besylate 5 Mg Tabs (Amlodipine besylate) .Marland Kitchen... Take 1 by mouth daily    Quinapril Hcl 40 Mg Tabs (Quinapril hcl) .Marland Kitchen... 1 by mouth once daily    Hydrochlorothiazide 12.5 Mg Caps (Hydrochlorothiazide) .Marland Kitchen... 1 once daily prn  Orders: Venipuncture (60454)  Problem # 3:  GOITER, MULTINODULAR (ICD-241.1) as per Dr Everardo All  Problem # 4:  IDDM (ICD-250.01)  as per Dr Everardo All Her updated medication list for this problem includes:    Novolin 70/30 70-30 % Susp (Insulin isophane & regular) .Marland Kitchen... 22 units qam 5 units qpm    Quinapril Hcl 40 Mg Tabs (Quinapril hcl) .Marland Kitchen... 1 by mouth once daily    Aspirin Adult Low Strength 81 Mg Tbec (Aspirin) .Marland Kitchen... 1 by mouth once daily  Orders: Venipuncture (09811)  Problem # 5:  POSTMENOPAUSAL SYNDROME (ICD-627.9) BMD F/U indicated due to PMS & recent fracture Orders: Venipuncture (91478) T-Vitamin D (25-Hydroxy) (29562-13086) Radiology Referral (Radiology)  Problem # 6:  UNSPECIFIED ANEMIA (ICD-285.9) PMH of Orders: TLB-CBC Platelet - w/Differential (85025-CBCD)  Complete Medication List: 1)  Amlodipine Besylate 5 Mg Tabs (Amlodipine besylate) .... Take 1 by mouth daily 2)  Novolin 70/30 70-30 % Susp (Insulin isophane & regular) .... 22 units qam 5 units qpm 3)  Quinapril Hcl 40 Mg Tabs (Quinapril hcl) .Marland Kitchen.. 1 by mouth once daily 4)  Accusure Insulin Syringe 31g X 5/16" 1 Ml Misc (Insulin syringe-needle u-100) .... Two times a day  size and brand of patient's choice 5)  Imdur 30 Mg Tb24 (Isosorbide  mononitrate) .Marland Kitchen.. 1 by mouth qd 6)  Omeprazole 20 Mg Cpdr (Omeprazole) .... Take 1 by mouth q am 7)  Lipitor 10 Mg Tabs (Atorvastatin calcium) .... Take 1 by mouth once daily**labs due now** 8)  Nitrostat 0.4 Mg Subl (Nitroglycerin) .... Use prn 9)  Hydrochlorothiazide 12.5 Mg Caps (Hydrochlorothiazide) .Marland Kitchen.. 1 once daily prn 10)  Aspirin Adult Low Strength 81 Mg Tbec (Aspirin) .Marland Kitchen.. 1 by mouth once daily 11)  Meloxicam 15 Mg Tabs (Meloxicam) .... Take 1 by mouth qd 12)  Propoxyphene N-apap 100-650 Mg Tabs (Propoxyphene n-apap) .... Take 1 q 4-6 hours prn 13)  Calcium 600 1500 Mg Tabs (Calcium carbonate) .Marland Kitchen.. 1 by mouth once daily 14)  Vitamin E Complex 400 Unit Caps (Vitamin e) .Marland Kitchen.. 1 by mouth once daily 15)  Cod Liver Oil Caps (Cod liver oil) .Marland Kitchen.. 1 by mouth once daily durning the winter 16)  Vitamin C 500 Mg Tabs (Ascorbic acid) .Marland Kitchen.. 1 by mouth once daily durning the winter months  Hypertension Assessment/Plan:      The patient's hypertensive risk group is category C: Target organ damage and/or diabetes.  Today's blood pressure is 120/74.    Lipid Assessment/Plan:      Based on NCEP/ATP III, the patient's risk factor category  is "history of coronary disease, peripheral vascular disease, cerebrovascular disease, or aortic aneurysm along with either diabetes, current smoker, or LDL > 130 plus HDL < 40 plus triglycerides > 200".  The patient's lipid goals are as follows: Total cholesterol goal is 200; LDL cholesterol goal is 70; HDL cholesterol goal is 40; Triglyceride goal is 150.  Her LDL cholesterol goal has not been met.  Secondary causes for hyperlipidemia have been ruled out.  She has been counseled on adjunctive measures for lowering her cholesterol and has been provided with dietary instructions.    Patient Instructions: 1)  Low sugar index (spike ) & load diet as discussed (Ex The New Sugar Busters). Avoid ALL foods & drinks with High Fructose Corn Syrup as #1,2 or #3 on label.

## 2010-04-11 NOTE — Progress Notes (Signed)
Summary: IRREGULAR HEARTBEATS NAD CHEST PAINS HAS TAKEN NITRO   Phone Note Call from Patient Call back at Home Phone [phone]   Caller: Patient Summary of Call: PT HAVING IRREGULAR HEART BEATS AND CHEST PAINS MAKES PT WEAK. Initial call taken by: Judie Grieve,  April 20, 2009 3:57 PM  Follow-up for Phone Call        Spoke with pt. Patient c/o of dull chest pain and pressure. Pain score is a "10" relieved with NTG SL down to a "5". Pt denies SOB. Pt. states is having this pain since Saturday. She has been taken NTG SL since then. RN adviced pt. to go to [facility] ER. Pt. agreed. Christain Sacramento NP notified. ER notification fax to ER.  Follow-up by: Ollen Gross, RN, BSN,  April 20, 2009 4:33 PM

## 2010-04-11 NOTE — Progress Notes (Signed)
  Phone Note Call from Patient Call back at Home Phone [phone]   Caller: Patient Call For: dr ellision Summary of Call: per pt call need her imdur filled for more  than 1 month filled sent to [facility] [phone] Initial call taken by: Shelbie Proctor,  February 24, 2007 10:10 AM    New/Updated Medications: IMDUR 30 MG  TB24 (ISOSORBIDE MONONITRATE) 1 by mouth qd   Prescriptions: IMDUR 30 MG  TB24 (ISOSORBIDE MONONITRATE) 1 by mouth qd  #30 x 6   Entered by:   Maris Berger   Authorized by:   Corwin Levins MD   Signed by:   Maris Berger on 02/24/2007   Method used:   Electronically sent to ...       [facility]  [address]       [address]       [place], [place]  [address]       Ph: [phone]       Fax: [phone]   RxID:   [id number]

## 2010-04-11 NOTE — Assessment & Plan Note (Signed)
Summary: 6 WK FU AFTER LABS/ $50 /NWS-RS BUMP PHONE STC   Vital Signs:  Patient profile:   77 year old female Height:      65 inches Weight:      203 pounds BMI:     33.90 Temp:     97.2 degrees F oral Pulse rate:   57 / minute BP sitting:   120 / 60  (left arm) Cuff size:   large  Vitals Entered By: Bill Salinas CMA (September 07, 2008 1:39 PM) CC: follow-up visit, pt has brought journal of blood sugars/ ab   Primary Provider:  Dr. Alwyn Ren  CC:  follow-up visit and pt has brought journal of blood sugars/ ab.  History of Present Illness: pt says she feels well in general.  cbg's vary from 70's (am) mid-100's (other times of day).  she brings a record of her cbg's which i have reviewed today.   Current Medications (verified): 1)  Amlodipine Besylate 5 Mg  Tabs (Amlodipine Besylate) .... Take 1 By Mouth Daily 2)  Novolin 70/30 70-30 %  Susp (Insulin Isophane & Regular) .... 22 Units Qam 5 Units Qpm 3)  Quinapril Hcl 40 Mg Tabs (Quinapril Hcl) .Marland Kitchen.. 1 By Mouth Once Daily 4)  Accusure Insulin Syringe 31g X 5/16" 1 Ml  Misc (Insulin Syringe-Needle U-100) .... Two Times A Day  Size and Brand of Patient's Choice 5)  Imdur 30 Mg  Tb24 (Isosorbide Mononitrate) .Marland Kitchen.. 1 By Mouth Qd 6)  Omeprazole 20 Mg  Cpdr (Omeprazole) .... Take 1 By Mouth Q Am 7)  Lipitor 10 Mg  Tabs (Atorvastatin Calcium) .... Take 1 By Mouth Once Daily**labs Due Now** 8)  Nitrostat 0.4 Mg  Subl (Nitroglycerin) .... Use Prn 9)  Hydrochlorothiazide 12.5 Mg  Caps (Hydrochlorothiazide) .Marland Kitchen.. 1 Once Daily Prn 10)  Aspirin Adult Low Strength 81 Mg Tbec (Aspirin) .Marland Kitchen.. 1 By Mouth Once Daily 11)  Meloxicam 15 Mg Tabs (Meloxicam) .... Take 1 By Mouth Qd 12)  Propoxyphene N-Apap 100-650 Mg Tabs (Propoxyphene N-Apap) .... Take 1 Q 4-6 Hours Prn 13)  Calcium 600 1500 Mg Tabs (Calcium Carbonate) .Marland Kitchen.. 1 By Mouth Once Daily 14)  Vitamin E Complex 400 Unit Caps (Vitamin E) .Marland Kitchen.. 1 By Mouth Once Daily 15)  Cod Liver Oil  Caps (Cod Liver Oil)  .Marland Kitchen.. 1 By Mouth Once Daily Durning The Winter 16)  Vitamin C 500 Mg Tabs (Ascorbic Acid) .Marland Kitchen.. 1 By Mouth Twice Daily  Allergies (verified): 1)  ! Codeine  Past History:  Past Medical History: Last updated: 08/02/2008 CORONARY ARTERY DISEASE (ICD-414.00) HYPERCHOLESTEROLEMIA (ICD-272.0) HYPERTENSION (ICD-401.9) SICKLE-CELL TRAIT (ICD-282.5) UNSPECIFIED ANEMIA (ICD-285.9) ANKLE PAIN, LEFT (ICD-719.47) ESOPHAGEAL REFLUX (ICD-530.81) GOITER, MULTINODULAR (ICD-241.1) HIATAL HERNIA (ICD-553.3) IDDM (ICD-250.01) -- type II; Multinodular goiter; Sickle Cell Trait FIBROCYSTIC BREAST DISEASE (ICD-610.1)  Review of Systems  The patient denies hypoglycemia.    Physical Exam  General:  obese.   Pulses:  dorsalis pedis intact on the right foot.  no carotid bruit  Extremities:  right foot:  no deformity.  no ulcer.  right foot is of normal color and temp.  no edema (left foot is in a brace, per ortho) Neurologic:  sensation is intact to touch on the right foot.    Impression & Recommendations:  Problem # 1:  IDDM (ICD-250.01) therapy limited by cost factors, and by pt's need for a simple regimen.  i'll do the best i can.  Medications Added to Medication List This Visit: 1)  Novolin 70/30 70-30 %  Susp (Insulin isophane & regular) .... 22 units qam 4 units qpm 2)  Vitamin C 500 Mg Tabs (Ascorbic acid) .Marland Kitchen.. 1 by mouth twice daily  Other Orders: Est. Patient Level III (16109)  Patient Instructions: 1)  reduce novolin 70/30 to 22 units am and 4 units pm 2)  check your blood sugar 1-2 times a day.  vary the time of day when you check, between before the 3 meals, and at bedtime.  also check if you have symptoms of your blood sugar being too high or too low.  please keep a record of the readings and bring it to your next appointment here.  please call us sooner if you are having low blood sugar episodes. 3)  it is very important to keep good control of blood pressure and cholesterol,  especially in those with diabetes.  please discuss these with your doctor.  you should take an aspirin every day, unless you have been advised by a doctor not to. 4)  Please schedule a follow-up appointment in 3 months.

## 2010-04-11 NOTE — Assessment & Plan Note (Signed)
Summary: discuss vit d level/cbs   Vital Signs:  Patient profile:   77 year old female Weight:      209.4 pounds BMI:     34.97 Temp:     98.1 degrees F oral Pulse rate:   64 / minute Resp:     16 per minute BP sitting:   124 / 78  (left arm) Cuff size:   large  Vitals Entered By: Shonna Chock CMA (December 13, 2009 2:22 PM) CC: Lab order to have Vit d level check, refill and discuss meds , Heartburn   Primary Care Provider:  Dr. Alwyn Ren  CC:  Lab order to have Vit d level check, refill and discuss meds , and Heartburn.  History of Present Illness: Hyperlipidemia Follow-Up      This is a 77 year old woman who presents for Hyperlipidemia follow-up.  The patient denies muscle aches, GI upset, abdominal pain, flushing, itching, constipation, diarrhea, and fatigue.  Other symptoms include pedal edema @ ankle previously fracture .  The patient denies the following symptoms: chest pain/pressure, exercise intolerance, dypsnea, palpitations, and syncope.  Compliance with medications (by patient report) has been near 100%.  Dietary compliance has been fair.  The patient reports no exercise.  Adjunctive measures currently used by the patient include ASA and fish oil supplements.  Heartburn      The patient also presents with Heartburn.  The patient reports sour taste in mouth, but denies acid reflux, epigastric pain, trouble swallowing, weight loss, and weight gain.  The patient denies the following alarm features: melena, dysphagia, hematemesis, and vomiting.  Symptoms are worse with spicy foods.  Prior evaluation has included EGD.  The patient has found the following treatments to be effective: a PPI, Omeprazole 20 mg  once daily .  Hypertension Follow-Up      The patient also presents for Hypertension follow-up.  The patient reports urinary frequency, but denies lightheadedness and headaches.  Compliance with medications (by patient report) has been near 100%.  Adjunctive measures currently used  by the patient include salt restriction.  BP @ drug store "like today"  Allergies: 1)  ! Codeine  Review of Systems MS:  on 5000 International Units vit D3 once daily .  Physical Exam  General:  in no acute distress; appears younger than age;alert,appropriate and cooperative throughout examination;overweight-appearing.   Neck:  No deformities, masses, or tenderness noted.Asymmetry R lobe > L Lungs:  Normal respiratory effort, chest expands symmetrically. Lungs are clear to auscultation, no crackles or wheezes. Heart:  normal rate, regular rhythm, no gallop, no rub, no JVD, no HJR, and grade 1 /6 systolic murmur.  Occasional pause Abdomen:  Bowel sounds positive,abdomen soft and non-tender without masses, organomegaly or hernias noted. Pulses:  R and L carotid,radial,dorsalis pedis and posterior tibial pulses are full and equal bilaterally Extremities:  1/2+ left pedal edema and trace right pedal edema.   Neurologic:  alert & oriented X3 and DTRs symmetrical  but 0-1/2 + @ knees Skin:  Intact without suspicious lesions or rashes Cervical Nodes:  No lymphadenopathy noted Psych:  memory intact for recent and remote, normally interactive, and good eye contact.     Impression & Recommendations:  Problem # 1:  HYPERCHOLESTEROLEMIA (ICD-272.0)  Her updated medication list for this problem includes:    Lipitor 10 Mg Tabs (Atorvastatin calcium) .Marland Kitchen... Take 1 by mouth once daily **appointment due**  Problem # 2:  HYPERTENSION (ICD-401.9)  Her updated medication list for this problem includes:  Amlodipine Besylate 10 Mg Tabs (Amlodipine besylate) .Marland Kitchen... 1 by mouth once daily (dose was increased in the hospital)    Quinapril Hcl 40 Mg Tabs (Quinapril hcl) .Marland Kitchen... 1 by mouth once daily    Hydrochlorothiazide 12.5 Mg Caps (Hydrochlorothiazide) .Marland Kitchen... 1 once daily prn  Problem # 3:  ESOPHAGEAL REFLUX (ICD-530.81)  Her updated medication list for this problem includes:    Omeprazole 20 Mg Cpdr  (Omeprazole) .Marland Kitchen... Take 1 by mouth q am  Problem # 4:  UNSPECIFIED VITAMIN D DEFICIENCY (ICD-268.9)  Complete Medication List: 1)  Amlodipine Besylate 10 Mg Tabs (Amlodipine besylate) .Marland Kitchen.. 1 by mouth once daily (dose was increased in the hospital) 2)  Novolin 70/30 70-30 % Susp (Insulin isophane & regular) .Marland Kitchen.. 15 units qam 6 units qpm 3)  Quinapril Hcl 40 Mg Tabs (Quinapril hcl) .Marland Kitchen.. 1 by mouth once daily 4)  Accusure Insulin Syringe 31g X 5/16" 1 Ml Misc (Insulin syringe-needle u-100) .... Two times a day  size and brand of patient's choice 5)  Imdur 30 Mg Tb24 (Isosorbide mononitrate) .Marland Kitchen.. 1 by mouth qd 6)  Omeprazole 20 Mg Cpdr (Omeprazole) .... Take 1 by mouth q am 7)  Lipitor 10 Mg Tabs (Atorvastatin calcium) .... Take 1 by mouth once daily **appointment due** 8)  Nitrostat 0.4 Mg Subl (Nitroglycerin) .Marland Kitchen.. 1 by mouth as directed for chest pain 9)  Hydrochlorothiazide 12.5 Mg Caps (Hydrochlorothiazide) .Marland Kitchen.. 1 once daily prn 10)  Aspirin Adult Low Strength 81 Mg Tbec (Aspirin) .Marland Kitchen.. 1 by mouth once daily 11)  Calcium 600 1500 Mg Tabs (Calcium carbonate) .... 2 by mouth once daily 12)  Vitamin E Complex 400 Unit Caps (Vitamin e) .Marland Kitchen.. 1 by mouth once daily 13)  Cod Liver Oil Caps (Cod liver oil) .Marland Kitchen.. 1 by mouth once daily durning the winter 14)  Vitamin C 500 Mg Tabs (Ascorbic acid) .Marland Kitchen.. 1 by mouth twice daily 15)  Vitamin D 5000 Unit Tabs (cholecalciferol)  .Marland Kitchen.. 1 by mouth once daily 16)  Multivitamins Tabs (Multiple vitamin) .Marland Kitchen.. 1 by mouth once daily  Other Orders: Flu Vaccine 60yrs + MEDICARE PATIENTS (J8119) Administration Flu vaccine - MCR (J4782) Flu Vaccine Consent Questions     Do you have a history of severe allergic reactions to this vaccine? no    Any prior history of allergic reactions to egg and/or gelatin? no    Do you have a sensitivity to the preservative Thimersol? no    Do you have a past history of Guillan-Barre Syndrome? no    Do you currently have an acute febrile  illness? no    Have you ever had a severe reaction to latex? no    Vaccine information given and explained to patient? yes    Are you currently pregnant? no    Lot Number:[id number]   Exp Date:09/09/2010   Site Given  Right Deltoid IMers: Flu Vaccine 61yrs + MEDICARE PATIENTS (N5621) Administration Flu vaccine - MCR (H0865)  Patient Instructions: 1)  Schedule fasting labs @ [facility]; see Diagnoes for Codes: vitamin D level; 2)  BMP ; 3)  Hepatic Panel ; 4)  Lipid Panel ;: 5)  TSH ; 6)  CBC w/ Diff . 7)  Check your Blood Pressure regularly. If it is above:140/90 ON AVERAGE  you should make an appointment. Prescriptions: HYDROCHLOROTHIAZIDE 12.5 MG  CAPS (HYDROCHLOROTHIAZIDE) 1 once daily prn  #30 Each x 4   Entered and Authorized by:   Marga Melnick MD   Signed by:   Chrissie Noa  Nyelli Samara MD on 12/13/2009   Method used:   Print then Give to Patient   RxID:   [id number] AMLODIPINE BESYLATE 10 MG TABS (AMLODIPINE BESYLATE) 1 by mouth once daily (DOSE WAS INCREASED IN THE HOSPITAL)  #30 x 5   Entered and Authorized by:   Marga Melnick MD   Signed by:   Marga Melnick MD on 12/13/2009   Method used:   Print then Give to Patient   RxID:   [id number] LIPITOR 10 MG  TABS (ATORVASTATIN CALCIUM) take 1 by mouth once daily **APPOINTMENT DUE**  #30 x 5   Entered and Authorized by:   Marga Melnick MD   Signed by:   Marga Melnick MD on 12/13/2009   Method used:   Print then Give to Patient   RxID:   [id number] OMEPRAZOLE 20 MG  CPDR (OMEPRAZOLE) take 1 by mouth q am  #30 Each x 5   Entered and Authorized by:   Marga Melnick MD   Signed by:   Marga Melnick MD on 12/13/2009   Method used:   Print then Give to Patient   RxID:   [id number] QUINAPRIL HCL 40 MG TABS (QUINAPRIL HCL) 1 by mouth once daily  #90 Each x 3   Entered and Authorized by:   Marga Melnick MD   Signed by:   Marga Melnick MD on 12/13/2009   Method used:   Print then Give to Patient   RxID:    [id number]   .[email or web address]

## 2010-04-11 NOTE — Assessment & Plan Note (Signed)
Summary: 1 YR F/U  Medications Added NITROSTAT 0.4 MG  SUBL (NITROGLYCERIN) 1 by mouth as directed for chest pain      Allergies Added:   Primary Provider:  Dr. Alwyn Ren   History of Present Illness: Patient has been out of the hospital and doing well overall.  Her sister died just before admission, and it was a stressful time.  Sometimes at bedtime her heart rate will seem like it goes fast, but she denies palpitations.    Current Medications (verified): 1)  Amlodipine Besylate 10 Mg Tabs (Amlodipine Besylate) .Marland Kitchen.. 1 By Mouth Once Daily (Dose Was Increased in The Hospital) 2)  Novolin 70/30 70-30 %  Susp (Insulin Isophane & Regular) .... 22 Units Qam 6 Units Qpm 3)  Quinapril Hcl 40 Mg Tabs (Quinapril Hcl) .Marland Kitchen.. 1 By Mouth Once Daily 4)  Accusure Insulin Syringe 31g X 5/16" 1 Ml  Misc (Insulin Syringe-Needle U-100) .... Two Times A Day  Size and Brand of Patient's Choice 5)  Imdur 30 Mg  Tb24 (Isosorbide Mononitrate) .Marland Kitchen.. 1 By Mouth Qd 6)  Omeprazole 20 Mg  Cpdr (Omeprazole) .... Take 1 By Mouth Q Am 7)  Lipitor 10 Mg  Tabs (Atorvastatin Calcium) .... Take 1 By Mouth Once Daily 8)  Nitrostat 0.4 Mg  Subl (Nitroglycerin) .Marland Kitchen.. 1 By Mouth As Directed For Chest Pain 9)  Hydrochlorothiazide 12.5 Mg  Caps (Hydrochlorothiazide) .Marland Kitchen.. 1 Once Daily Prn 10)  Aspirin Adult Low Strength 81 Mg Tbec (Aspirin) .Marland Kitchen.. 1 By Mouth Once Daily 11)  Propoxyphene N-Apap 100-650 Mg Tabs (Propoxyphene N-Apap) .Marland Kitchen.. 1 By Mouth Every 8-12 Hours 12)  Calcium 600 1500 Mg Tabs (Calcium Carbonate) .... 2 By Mouth Once Daily 13)  Vitamin E Complex 400 Unit Caps (Vitamin E) .Marland Kitchen.. 1 By Mouth Once Daily 14)  Cod Liver Oil  Caps (Cod Liver Oil) .Marland Kitchen.. 1 By Mouth Once Daily Durning The Winter 15)  Vitamin C 500 Mg Tabs (Ascorbic Acid) .Marland Kitchen.. 1 By Mouth Twice Daily 16)  Vitamin D 5000 Unit Tabs (Cholecalciferol) .Marland Kitchen.. 1 By Mouth Once Daily 17)  Multivitamins  Tabs (Multiple Vitamin) .Marland Kitchen.. 1 By Mouth Once Daily 18)  Fluoxetine Hcl 20  Mg Caps (Fluoxetine Hcl) .Marland Kitchen.. 1 Once Daily  Allergies (verified): 1)  ! Codeine  Past History:  Past Medical History: Last updated: 08/02/2008 CORONARY ARTERY DISEASE (ICD-414.00) HYPERCHOLESTEROLEMIA (ICD-272.0) HYPERTENSION (ICD-401.9) SICKLE-CELL TRAIT (ICD-282.5) UNSPECIFIED ANEMIA (ICD-285.9) ANKLE PAIN, LEFT (ICD-719.47) ESOPHAGEAL REFLUX (ICD-530.81) GOITER, MULTINODULAR (ICD-241.1) HIATAL HERNIA (ICD-553.3) IDDM (ICD-250.01) -- type II; Multinodular goiter; Sickle Cell Trait FIBROCYSTIC BREAST DISEASE (ICD-610.1)  Vital Signs:  Patient profile:   77 year old female Height:      65 inches Weight:      203 pounds BMI:     33.90 Pulse rate:   66 / minute Resp:     18 per minute BP sitting:   122 / 62  (right arm)  Vitals Entered By: Marrion Coy, CNA (June 06, 2009 4:24 PM)  Physical Exam  General:  Well developed, well nourished, in no acute distress. Head:  normocephalic and atraumatic Eyes:  PERRLA/EOM intact; conjunctiva and lids normal. Neck:  Neck supple, no JVD. No masses, thyromegaly or abnormal cervical nodes. Lungs:  Clear bilaterally to auscultation and percussion. Heart:  PMI non displaced.  Normal S1 and S2.  WIthout murmur, rub.  Sotft S4 gallop. Pulses:  pulses normal in all 4 extremities Extremities:  No clubbing or cyanosis. Neurologic:  Alert and oriented x 3.  Echocardiogram  Procedure date:  04/21/2009  Findings:       Study Conclusions    - Left ventricle: The cavity size was normal. Wall thickness was     normal. Systolic function was normal. The estimated ejection     fraction was in the range of 60% to 65%. Although no diagnostic     regional wall motion abnormality was identified, this possibility     cannot be completely excluded on the basis of this study. Doppler     parameters are consistent with abnormal left ventricular     relaxation (grade 1 diastolic dysfunction).   - Aortic valve: There was no stenosis.   - Aorta:  Mildly dilated aortic root.   - Mitral valve: No significant regurgitation.   - Right ventricle: The cavity size was normal. Systolic function was     normal.   - Pulmonary arteries: No TR doppler jet was measured, unable to     estimate PA systolic pressure.   - Inferior vena cava: The vessel was normal in size; the     respirophasic diameter changes were in the normal range (= 50%);     findings are consistent with normal central venous pressure.   Impressions:    - Technically difficult study. LV systolic function was normal, EF     60-65%. No definite wall motion abnormalities but not all segments     were visualized. No significant valvular abnormalities.  Nuclear ETT  Procedure date:  05/10/2009  Findings:      QPS  Raw Data Images:  Normal; no motion artifact; normal heart/lung ratio. Stress Images:  There is normal uptake in all areas. Rest Images:  There is mildly decreased uptake in the anterior wall. Subtraction (SDS):  No evidence of ischemia. Transient Ischemic Dilatation:  .94  (Normal <1.22)  Lung/Heart Ratio:  .32  (Normal <0.45)  Quantitative Gated Spect Images  QGS EDV:  69 ml QGS ESV:  15 ml QGS EF:  78 % QGS cine images:  Normal  Findings  Normal nuclear study  Impression & Recommendations:  Problem # 1:  CORONARY ARTERY DISEASE (ICD-414.00) Has underlying diabetes, and known CAD with prior two vessel PCI.  Symptoms remain stable, and nuclear study is normal at present time.  Will continue medical therapy. Her updated medication list for this problem includes:    Amlodipine Besylate 10 Mg Tabs (Amlodipine besylate) .Marland Kitchen... 1 by mouth once daily (dose was increased in the hospital)    Quinapril Hcl 40 Mg Tabs (Quinapril hcl) .Marland Kitchen... 1 by mouth once daily    Imdur 30 Mg Tb24 (Isosorbide mononitrate) .Marland Kitchen... 1 by mouth qd    Nitrostat 0.4 Mg Subl (Nitroglycerin) .Marland Kitchen... 1 by mouth as directed for chest pain    Aspirin Adult Low Strength 81 Mg Tbec (Aspirin)  .Marland Kitchen... 1 by mouth once daily  Problem # 2:  HYPERCHOLESTEROLEMIA (ICD-272.0) Followed by Dr Alwyn Ren. Her updated medication list for this problem includes:    Lipitor 10 Mg Tabs (Atorvastatin calcium) .Marland Kitchen... Take 1 by mouth once daily  Problem # 3:  HYPERTENSION (ICD-401.9) Controlled at present time. Her updated medication list for this problem includes:    Amlodipine Besylate 10 Mg Tabs (Amlodipine besylate) .Marland Kitchen... 1 by mouth once daily (dose was increased in the hospital)    Quinapril Hcl 40 Mg Tabs (Quinapril hcl) .Marland Kitchen... 1 by mouth once daily    Hydrochlorothiazide 12.5 Mg Caps (Hydrochlorothiazide) .Marland Kitchen... 1 once daily prn    Aspirin Adult  Low Strength 81 Mg Tbec (Aspirin) .Marland Kitchen... 1 by mouth once daily  Problem # 4:  IDDM (ICD-250.01) under management of Dr. Alwyn Ren. Her updated medication list for this problem includes:    Novolin 70/30 70-30 % Susp (Insulin isophane & regular) .Marland Kitchen... 22 units qam 6 units qpm    Quinapril Hcl 40 Mg Tabs (Quinapril hcl) .Marland Kitchen... 1 by mouth once daily    Aspirin Adult Low Strength 81 Mg Tbec (Aspirin) .Marland Kitchen... 1 by mouth once daily  Patient Instructions: 1)  Your physician recommends that you continue on your current medications as directed. Please refer to the Current Medication list given to you today. 2)  Your physician wants you to follow-up in:   1 YEAR. You will receive a reminder letter in the mail two months in advance. If you don't receive a letter, please call our office to schedule the follow-up appointment. Prescriptions: NITROSTAT 0.4 MG  SUBL (NITROGLYCERIN) 1 by mouth as directed for chest pain  #25 x 6   Entered by:   Marrion Coy, CNA   Authorized by:   Ronaldo Miyamoto, MD, [facility]   Signed by:   Marrion Coy, CNA on 06/06/2009   Method used:   Electronically to        [facility] [address] (retail)       [address]       [place], [place]  [address]       Ph: [phone]       Fax: [phone]   RxID:    [id number]

## 2010-04-11 NOTE — Letter (Signed)
Summary: Results Follow up Letter  [facility]  [address] [place], [place] [address]   Phone: [phone]  Fax: [phone]    08/16/2008 MRN: [id number]  Rebecca [name] [address] [place], [place]  [address]  Dear Ms. Tumminello,  The following are the results of your recent test(s):  Test         Result    Pap Smear:        Normal _____  Not Normal _____ Comments: ______________________________________________________ Cholesterol: LDL(Bad cholesterol):         Your goal is less than:         HDL (Good cholesterol):       Your goal is more than: Comments:  ______________________________________________________ Mammogram:        Normal _____  Not Normal _____ Comments:  ___________________________________________________________________ Hemoccult:        Normal _____  Not normal _______ Comments:    _____________________________________________________________________ Other Tests: PLEASE SEE ATTACHED LABS DONE ON 08/12/2008    We routinely do not discuss normal results over the telephone.  If you desire a copy of the results, or you have any questions about this information we can discuss them at your next office visit.   Sincerely,

## 2010-04-11 NOTE — Medication Information (Signed)
Summary: Statement for Therapeutic Shoes/[facility]  Statement for Therapeutic Shoes/ Podiatry   Imported By: Lanelle Bal 09/05/2007 12:12:53  _____________________________________________________________________  External Attachment:    Type:   Image     Comment:   External Document

## 2010-04-11 NOTE — Letter (Signed)
Summary: Results Follow up Letter  [facility]  [address] [place], [place] [address]   Phone: [phone]  Fax: [phone]    01/05/2008 MRN: [id number]  Zoiee Nordstrom [address] [place], [place]  [address]  Dear Ms. Blyden,  The following are the results of your recent test(s):  Test         Result    Pap Smear:        Normal _____  Not Normal _____ Comments: ______________________________________________________ Cholesterol: LDL(Bad cholesterol):         Your goal is less than:         HDL (Good cholesterol):       Your goal is more than: Comments:  ______________________________________________________ Mammogram:        Normal _____  Not Normal _____ Comments:  ___________________________________________________________________ Hemoccult:        Normal _____  Not normal _______ Comments:    _____________________________________________________________________ Other Tests: PLEASE SEE ATTACHED LABS FROM 01/01/08 AND COMMENTS    We routinely do not discuss normal results over the telephone.  If you desire a copy of the results, or you have any questions about this information we can discuss them at your next office visit.   Sincerely,

## 2010-04-11 NOTE — Progress Notes (Signed)
Summary: Nuclear pre procedure  Phone Note Outgoing Call Call back at Home Phone [phone]   Call placed by: Rea College, CMA,  May 09, 2009 1:11 PM Call placed to: Patient Summary of Call: Left message with information on [employer] Information Sheet (see scanned document for details).      Nuclear Med Background Indications for Stress Test: Evaluation for Ischemia, Stent Patency, PTCA Patency, Post Hospital  Indications Comments: 2/9-2/12/11 chest pain, negative enzymes  History: Angioplasty, Asthma, Echo, Myocardial Perfusion Study, Stents  History Comments: '99 PTCA/Stent-Cfx/RI; '04 MPS:no ischemia, EF=76%; 04/21/09 Echo:EF=60-65%  Symptoms: Chest Pain, Chest Pain with Exertion, DOE, Fatigue, Near Syncope, Palpitations, SOB    Nuclear Pre-Procedure Cardiac Risk Factors: Family History - CAD, Hypertension, IDDM Type 1, Lipids Height (in): 65

## 2010-04-11 NOTE — Assessment & Plan Note (Signed)
Summary: stiffnes in and legs and feet/talk about special shoes/kdc   Vital Signs:  Patient profile:   77 year old female Weight:      210 pounds BMI:     35.07 Temp:     98.0 degrees F oral Pulse rate:   72 / minute Resp:     19 per minute BP sitting:   138 / 66  (left arm) Cuff size:   large  Vitals Entered By: Shonna Chock (November 08, 2008 9:06 AM) CC: Stiffness in foot and leg since leg injury(02/2008).  Patient said all shoes seem too heavy-patient would like to discuss getting some diabetic shoes. Discuss labs to check for Imflammation and discuss side effects of meloxicam and HCTZ Comments REVIEWED MED LIST, PATIENT AGREED DOSE AND INSTRUCTION CORRECT    Primary Care Provider:  Dr. Alwyn Ren  CC:  Stiffness in foot and leg since leg injury(02/2008).  Patient said all shoes seem too heavy-patient would like to discuss getting some diabetic shoes. Discuss labs to check for Imflammation and discuss side effects of meloxicam and HCTZ.  History of Present Illness: "Stiffness from knees down into feet" x 1 month when walking. Dr Lestine Box Rxed Diabetic Shoes with a brace ; she did not fill that Rx because she is worried the brace will "rub my leg". The brace is "too heavy for me".I gave her options of discussing this with Dr Lestine Box or seeing Podiatrist for "lighter shoe". She has delayed this due to concern about making copay.                                                                     "Fluid pill (HCTZ) drains me.  Allergies: 1)  ! Codeine  Review of Systems CV:  Denies leg cramps with exertion. MS:  Complains of low back pain; denies joint redness, joint swelling, mid back pain, muscle aches, cramps, and thoracic pain; Pain in L ankle since fracture ; residual weakness.  Physical Exam  General:  in no acute distress; alert  and cooperative throughout examination Lungs:  Normal respiratory effort, chest expands symmetrically. Lungs are clear to auscultation, no crackles or  wheezes. Heart:  normal rate, regular rhythm, no gallop, no rub, no JVD, and grade 1/2-1 /6 systolic murmur.   Pulses:  R and L dorsalis pedis and posterior tibial pulses are full and equal bilaterally Extremities:  trace right pedal edema.  Fusiform changes L ankle. Pes planus. Skin:  Intact without suspicious lesions or rashes   Impression & Recommendations:  Problem # 1:  STIFFNESS (ICD-719.50)  Orders: Venipuncture (32355) TLB-BUN (Urea Nitrogen) (84520-BUN) TLB-Sedimentation Rate (ESR) (85652-ESR) TLB-CK Total Only(Creatine Kinase/CPK) (82550-CK)  Problem # 2:  EDEMA- LOCALIZED (ICD-782.3)  Her updated medication list for this problem includes:    Hydrochlorothiazide 12.5 Mg Caps (Hydrochlorothiazide) .Marland Kitchen... 1 once daily prn  Orders: Venipuncture (73220) TLB-Creatinine, Blood (82565-CREA) TLB-BUN (Urea Nitrogen) (84520-BUN)  Problem # 3:  HYPERTENSION (ICD-401.9)  Her updated medication list for this problem includes:    Amlodipine Besylate 5 Mg Tabs (Amlodipine besylate) .Marland Kitchen... Take 1 by mouth daily    Quinapril Hcl 40 Mg Tabs (Quinapril hcl) .Marland Kitchen... 1 by mouth once daily    Hydrochlorothiazide 12.5 Mg Caps (Hydrochlorothiazide) .Marland Kitchen... 1 once daily  prn  Orders: Venipuncture (13086) TLB-Creatinine, Blood (82565-CREA) TLB-Potassium (K+) (84132-K) TLB-BUN (Urea Nitrogen) (84520-BUN)  Problem # 4:  UNSPECIFIED VITAMIN D DEFICIENCY (ICD-268.9)  Orders: TLB-CK Total Only(Creatine Kinase/CPK) (82550-CK)  Complete Medication List: 1)  Amlodipine Besylate 5 Mg Tabs (Amlodipine besylate) .... Take 1 by mouth daily 2)  Novolin 70/30 70-30 % Susp (Insulin isophane & regular) .... 22 units qam 4 units qpm 3)  Quinapril Hcl 40 Mg Tabs (Quinapril hcl) .Marland Kitchen.. 1 by mouth once daily 4)  Accusure Insulin Syringe 31g X 5/16" 1 Ml Misc (Insulin syringe-needle u-100) .... Two times a day  size and brand of patient's choice 5)  Imdur 30 Mg Tb24 (Isosorbide mononitrate) .Marland Kitchen.. 1 by mouth  qd 6)  Omeprazole 20 Mg Cpdr (Omeprazole) .... Take 1 by mouth q am 7)  Lipitor 10 Mg Tabs (Atorvastatin calcium) .... Take 1 by mouth once daily**labs due now** 8)  Nitrostat 0.4 Mg Subl (Nitroglycerin) .... Use prn 9)  Hydrochlorothiazide 12.5 Mg Caps (Hydrochlorothiazide) .Marland Kitchen.. 1 once daily prn 10)  Aspirin Adult Low Strength 81 Mg Tbec (Aspirin) .Marland Kitchen.. 1 by mouth once daily 11)  Meloxicam 15 Mg Tabs (Meloxicam) .... Take 1 by mouth qd 12)  Propoxyphene N-apap 100-650 Mg Tabs (Propoxyphene n-apap) .Marland Kitchen.. 1 by mouth every 8-12 hours 13)  Calcium 600 1500 Mg Tabs (Calcium carbonate) .... 2 by mouth once daily 14)  Vitamin E Complex 400 Unit Caps (Vitamin e) .Marland Kitchen.. 1 by mouth once daily 15)  Cod Liver Oil Caps (Cod liver oil) .Marland Kitchen.. 1 by mouth once daily durning the winter 16)  Vitamin C 500 Mg Tabs (Ascorbic acid) .Marland Kitchen.. 1 by mouth twice daily 17)  Vitamin D 2000 Unit Tabs (Cholecalciferol) .Marland Kitchen.. 1 by mouth once daily 18)  Multivitamins Tabs (Multiple vitamin) .Marland Kitchen.. 1 by mouth once daily  Patient Instructions: 1)  Please call Podiatrist & make appt as you stated you will. If you change your  mind we can make an appt. Check vitamin D level in late November(268.9)

## 2010-04-11 NOTE — Assessment & Plan Note (Signed)
Summary: 2 WK FU-STC  Medications Added AMARYL 4 MG TABS (GLIMEPIRIDE) Take 1 tablet by mouth twice a day HUMULIN N 100 UNIT/ML SUSP (INSULIN ISOPHANE HUMAN)  METFORMIN HCL 500 MG TB24 (METFORMIN HCL) Take 2 tablet by mouth twice a day QUINAPRIL HCL 40 MG TABS (QUINAPRIL HCL)         Vital Signs:  Patient Profile:   77 Years Old Female Weight:      200.0 pounds Temp:     97.1 degrees F oral Pulse rate:   72 / minute BP sitting:   147 / 64  (right arm) Cuff size:   regular  Vitals Entered By: Orlan Leavens (February 03, 2007 10:46 AM)                 Visit Type:  f/u PCP:  Dr. Alwyn Ren  Chief Complaint:  REQUESTING SAMPLES OF ALLEDRA D.  History of Present Illness: patient states she is feeling better from a recent cold that she had.  She currently takes Novolin 70/30 insulin, but because of its low cost.  She is taking 20 units with breakfast, and 5 with her evening meal.  She brings with her and extensive record of her home glucoses which I have reviewed today.  Her morning glucoses vary from 74 to 175, with at least half of them being below 100.  She also checks before lunch.  These vary from 67 to 269.  Again, more than half or below 100.    Past Medical History:    Reviewed history from 11/26/2006 and no changes required:       Hypertension       Coronary artery disease       Diabetes mellitus, type II     Review of Systems  The patient denies syncope.     Physical Exam  General:     obese.   Pulses:     dorsalis pedis intact bilat.   Extremities:     no deformity.  no ulcer on the feet.  feet are of normal color and temp.  no edema  Neurologic:     sensation intact to touch on both feet.     Impression & Recommendations:  Problem # 1:  DIABETES MELLITUS, TYPE II (ICD-250.00) apparently slightly overcontrolled Her updated medication list for this problem includes:    Novolin 70/30 70-30 % Susp (Insulin isophane & regular) .Marland KitchenMarland KitchenMarland KitchenMarland Kitchen 35 units qam  Amaryl 4 Mg Tabs (Glimepiride) .Marland Kitchen... Take 1 tablet by mouth twice a day    Humulin N 100 Unit/ml Susp (Insulin isophane human)    Metformin Hcl 500 Mg Tb24 (Metformin hcl) .Marland Kitchen... Take 2 tablet by mouth twice a day  Orders: Est. Patient Level III ([address])   Medications Added to Medication List This Visit: 1)  Amaryl 4 Mg Tabs (Glimepiride) .... Take 1 tablet by mouth twice a day 2)  Humulin N 100 Unit/ml Susp (Insulin isophane human) 3)  Metformin Hcl 500 Mg Tb24 (Metformin hcl) .... Take 2 tablet by mouth twice a day 4)  Quinapril Hcl 40 Mg Tabs (Quinapril hcl)   Patient Instructions: 1)  reduce novolin 70/30, to 18 units with breakfast, and 5 with supper 2)  return in about 3 months 3)  patient is advised to further this area the time of day which she checks her glucose, to include some supper time and bedtime readings as well.    ]

## 2010-04-11 NOTE — Letter (Signed)
Summary: Results Follow up Letter  [facility]  [address] [place], [place] [address]   Phone: [phone]  Fax: [phone]    11/16/2008 MRN: [id number]  Julianne Opiela [address] [place], [place]  [address]  Dear Ms. Fichera,  The following are the results of your recent test(s):  Test         Result    Pap Smear:        Normal _____  Not Normal _____ Comments: ______________________________________________________ Cholesterol: LDL(Bad cholesterol):         Your goal is less than:         HDL (Good cholesterol):       Your goal is more than: Comments:  ______________________________________________________ Mammogram:        Normal _____  Not Normal _____ Comments:  ___________________________________________________________________ Hemoccult:        Normal _____  Not normal _______ Comments:    _____________________________________________________________________ Other Tests:  PLEASE SEE ATTACHED COPY OF LAB RESULTS  We routinely do not discuss normal results over the telephone.  If you desire a copy of the results, or you have any questions about this information we can discuss them at your next office visit.   Sincerely,

## 2010-04-11 NOTE — Assessment & Plan Note (Signed)
Summary: f/u dm  Medications Added AMARYL 4 MG  TABS (GLIMEPIRIDE) bid GLUCOPHAGE XR 500 MG  TB24 (METFORMIN HCL) bid        Vital Signs:  Patient Profile:   77 Years Old Female Weight:      188 pounds Temp:     97 degrees F Pulse (ortho):   63 / minute BP sitting:   122 / 65               History of Present Illness: pt states no cbg record.  cbg's range <100 to 200's, with no pattern throughout the day.  feels well.       Review of Systems       denies hypoglycemia   Physical Exam  General:     overweight-appearing.   Extremities:     no edema    Impression & Recommendations: rx of dm limited by nonadherence with cbg's  Complete Medication List: 1)  Amlodipine Besylate 5 Mg Tabs (Amlodipine besylate) .... Take 1 by mouth daily 2)  Amaryl 4 Mg Tabs (Glimepiride) .... Bid 3)  Glucophage Xr 500 Mg Tb24 (Metformin hcl) .... Bid   Patient Instructions: 1)  a1c today 2)  check lipids at pt request 3)  Please schedule a follow-up appointment in 3 months if labs good.

## 2010-04-11 NOTE — Assessment & Plan Note (Signed)
Summary: 3 mth fu  #  stc   Vital Signs:  Patient profile:   77 year old female Height:      65 inches (165.10 cm) Weight:      207 pounds (94.09 kg) O2 Sat:      97 % on Room air Temp:     97.1 degrees F (36.17 degrees C) oral Pulse rate:   64 / minute BP sitting:   158 / 74  (left arm) Cuff size:   large  Vitals Entered By: Josph Macho CMA (February 22, 2009 1:03 PM)  O2 Flow:  Room air CC: 3 month follow up/ pt is upset because she states her blood pressure is never high? Second time I took it 160/80/ pt states she is not taking Meloxicam/ pt states she has had a bone density done but doesn't remember when/ CF Is Patient Diabetic? No   Primary Provider:  Dr. Alwyn Ren  CC:  3 month follow up/ pt is upset because she states her blood pressure is never high? Second time I took it 160/80/ pt states she is not taking Meloxicam/ pt states she has had a bone density done but doesn't remember when/ CF.  History of Present Illness: she brings a record of her cbg's which i have reviewed today.  it is sometimes low before lunch.    Current Medications (verified): 1)  Amlodipine Besylate 5 Mg  Tabs (Amlodipine Besylate) .... Take 1 By Mouth Daily 2)  Novolin 70/30 70-30 %  Susp (Insulin Isophane & Regular) .... 22 Units Qam 4 Units Qpm 3)  Quinapril Hcl 40 Mg Tabs (Quinapril Hcl) .Marland Kitchen.. 1 By Mouth Once Daily 4)  Accusure Insulin Syringe 31g X 5/16" 1 Ml  Misc (Insulin Syringe-Needle U-100) .... Two Times A Day  Size and Brand of Patient's Choice 5)  Imdur 30 Mg  Tb24 (Isosorbide Mononitrate) .Marland Kitchen.. 1 By Mouth Qd 6)  Omeprazole 20 Mg  Cpdr (Omeprazole) .... Take 1 By Mouth Q Am 7)  Lipitor 10 Mg  Tabs (Atorvastatin Calcium) .... Take 1 By Mouth Once Daily 8)  Nitrostat 0.4 Mg  Subl (Nitroglycerin) .... Use Prn 9)  Hydrochlorothiazide 12.5 Mg  Caps (Hydrochlorothiazide) .Marland Kitchen.. 1 Once Daily Prn 10)  Aspirin Adult Low Strength 81 Mg Tbec (Aspirin) .Marland Kitchen.. 1 By Mouth Once Daily 11)  Meloxicam 15 Mg  Tabs (Meloxicam) .... Take 1 By Mouth Qd 12)  Propoxyphene N-Apap 100-650 Mg Tabs (Propoxyphene N-Apap) .Marland Kitchen.. 1 By Mouth Every 8-12 Hours 13)  Calcium 600 1500 Mg Tabs (Calcium Carbonate) .... 2 By Mouth Once Daily 14)  Vitamin E Complex 400 Unit Caps (Vitamin E) .Marland Kitchen.. 1 By Mouth Once Daily 15)  Cod Liver Oil  Caps (Cod Liver Oil) .Marland Kitchen.. 1 By Mouth Once Daily Durning The Winter 16)  Vitamin C 500 Mg Tabs (Ascorbic Acid) .Marland Kitchen.. 1 By Mouth Twice Daily 17)  Vitamin D 5000 Unit Tabs (Cholecalciferol) .Marland Kitchen.. 1 By Mouth Once Daily 18)  Multivitamins  Tabs (Multiple Vitamin) .Marland Kitchen.. 1 By Mouth Once Daily  Allergies (verified): 1)  ! Codeine  Past History:  Past Medical History: Last updated: 08/02/2008 CORONARY ARTERY DISEASE (ICD-414.00) HYPERCHOLESTEROLEMIA (ICD-272.0) HYPERTENSION (ICD-401.9) SICKLE-CELL TRAIT (ICD-282.5) UNSPECIFIED ANEMIA (ICD-285.9) ANKLE PAIN, LEFT (ICD-719.47) ESOPHAGEAL REFLUX (ICD-530.81) GOITER, MULTINODULAR (ICD-241.1) HIATAL HERNIA (ICD-553.3) IDDM (ICD-250.01) -- type II; Multinodular goiter; Sickle Cell Trait FIBROCYSTIC BREAST DISEASE (ICD-610.1)  Review of Systems  The patient denies syncope.    Physical Exam  General:  obese.  no distress. Pulses:  dorsalis pedis intact on the right foot.  no carotid bruit  Extremities:  right foot:  no deformity.  no ulcer.  right foot is of normal color and temp.  no edema. Neurologic:  sensation is intact to touch on the right foot.  Additional Exam:  Hemoglobin A1C       [H]  7.8 %    Impression & Recommendations:  Problem # 1:  IDDM (ICD-250.01) this is the best control this pt should aim for, given this regimen, which does a poor job of matching insulin to her changing needs throughout the day  Medications Added to Medication List This Visit: 1)  Novolin 70/30 70-30 % Susp (Insulin isophane & regular) .... 20 units qam 4 units qpm  Other Orders: TLB-A1C / Hgb A1C (Glycohemoglobin) (83036-A1C)  Patient  Instructions: 1)  reduce novolin 70/30 to 20 units am and 4 units pm. 2)  check your blood sugar 1-2 times a day.  vary the time of day when you check, between before the 3 meals, and at bedtime.  also check if you have symptoms of your blood sugar being too high or too low.  please keep a record of the readings and bring it to your next appointment here.  please call us sooner if you are having low blood sugar episodes. 3)  Please schedule a follow-up appointment in 3 months. 4)  tests are being ordered for you today.  a few days after the test(s), please call [phone] to hear your test results. Prescriptions: NOVOLIN 70/30 70-30 %  SUSP (INSULIN ISOPHANE & REGULAR) 20 units qam 4 units qpm  #1 vial x 11   Entered and Authorized by:   Minus Breeding MD   Signed by:   Minus Breeding MD on 02/22/2009   Method used:   Electronically to        [facility] (retail)       [address]       [place], [place]  [address]       Ph: [phone]       Fax: [phone]   RxID:   [id number]

## 2010-04-11 NOTE — Progress Notes (Signed)
Summary: Insulin  Phone Note Call from Patient Call back at [facility] Phone [phone]   Summary of Call: Patient is requesting sample of insulin. She says pharm will not fill until the 17th.  Initial call taken by: Lamar Sprinkles, CMA,  November 22, 2008 11:51 AM  Follow-up for Phone Call        options: i can refill now or you can move-up appt to next few days.  i'll check to see if we have samples then. Follow-up by: Minus Breeding MD,  November 22, 2008 12:39 PM  Additional Follow-up for Phone Call Additional follow up Details #1::        pt would prefer refills until appt Additional Follow-up by: Margaret Pyle, CMA,  November 22, 2008 1:46 PM    Additional Follow-up for Phone Call Additional follow up Details #2::    please refill x 1 Follow-up by: Minus Breeding MD,  November 22, 2008 4:29 PM  Prescriptions: NOVOLIN 70/30 70-30 %  SUSP (INSULIN ISOPHANE & REGULAR) 22 units qam 4 units qpm  #1 box x 0   Entered by:   Margaret Pyle, CMA   Authorized by:   Minus Breeding MD   Signed by:   Margaret Pyle, CMA on 11/22/2008   Method used:   Electronically to        [facility] (retail)       [address]       [place], [place]  [address]       Ph: [phone]       Fax: [phone]   RxID:   [id number]

## 2010-04-11 NOTE — Assessment & Plan Note (Signed)
Summary: PER PT 3 MTH FU  $50  STC-PER PT RS TO 3 MTH FU--$50-STC   Vital Signs:  Patient profile:   77 year old female Height:      65 inches (165.10 cm) Weight:      202.38 pounds (91.99 kg) O2 Sat:      95 % on Room air Temp:     97.0 degrees F (36.11 degrees C) oral Pulse rate:   56 / minute BP sitting:   122 / 60  (left arm) Cuff size:   large  Vitals Entered By: Josph Macho CMA (December 07, 2008 8:44 AM)  O2 Flow:  Room air CC: follow-up visit/ CF Is Patient Diabetic? No   Primary Provider:  Dr. Alwyn Ren  CC:  follow-up visit/ CF.  History of Present Illness: pt states he feels well in general, except for low-back pain.   no cbg record, but states cbg's are well-controlled.  she says it is in general higher later in the day than in am.  Current Medications (verified): 1)  Amlodipine Besylate 5 Mg  Tabs (Amlodipine Besylate) .... Take 1 By Mouth Daily 2)  Novolin 70/30 70-30 %  Susp (Insulin Isophane & Regular) .... 22 Units Qam 4 Units Qpm 3)  Quinapril Hcl 40 Mg Tabs (Quinapril Hcl) .Marland Kitchen.. 1 By Mouth Once Daily 4)  Accusure Insulin Syringe 31g X 5/16" 1 Ml  Misc (Insulin Syringe-Needle U-100) .... Two Times A Day  Size and Brand of Patient's Choice 5)  Imdur 30 Mg  Tb24 (Isosorbide Mononitrate) .Marland Kitchen.. 1 By Mouth Qd 6)  Omeprazole 20 Mg  Cpdr (Omeprazole) .... Take 1 By Mouth Q Am 7)  Lipitor 10 Mg  Tabs (Atorvastatin Calcium) .... Take 1 By Mouth Once Daily 8)  Nitrostat 0.4 Mg  Subl (Nitroglycerin) .... Use Prn 9)  Hydrochlorothiazide 12.5 Mg  Caps (Hydrochlorothiazide) .Marland Kitchen.. 1 Once Daily Prn 10)  Aspirin Adult Low Strength 81 Mg Tbec (Aspirin) .Marland Kitchen.. 1 By Mouth Once Daily 11)  Meloxicam 15 Mg Tabs (Meloxicam) .... Take 1 By Mouth Qd 12)  Propoxyphene N-Apap 100-650 Mg Tabs (Propoxyphene N-Apap) .Marland Kitchen.. 1 By Mouth Every 8-12 Hours 13)  Calcium 600 1500 Mg Tabs (Calcium Carbonate) .... 2 By Mouth Once Daily 14)  Vitamin E Complex 400 Unit Caps (Vitamin E) .Marland Kitchen.. 1 By Mouth  Once Daily 15)  Cod Liver Oil  Caps (Cod Liver Oil) .Marland Kitchen.. 1 By Mouth Once Daily Durning The Winter 16)  Vitamin C 500 Mg Tabs (Ascorbic Acid) .Marland Kitchen.. 1 By Mouth Twice Daily 17)  Vitamin D 2000 Unit Tabs (Cholecalciferol) .Marland Kitchen.. 1 By Mouth Once Daily 18)  Multivitamins  Tabs (Multiple Vitamin) .Marland Kitchen.. 1 By Mouth Once Daily  Allergies (verified): 1)  ! Codeine  Past History:  Past Medical History: Last updated: 08/02/2008 CORONARY ARTERY DISEASE (ICD-414.00) HYPERCHOLESTEROLEMIA (ICD-272.0) HYPERTENSION (ICD-401.9) SICKLE-CELL TRAIT (ICD-282.5) UNSPECIFIED ANEMIA (ICD-285.9) ANKLE PAIN, LEFT (ICD-719.47) ESOPHAGEAL REFLUX (ICD-530.81) GOITER, MULTINODULAR (ICD-241.1) HIATAL HERNIA (ICD-553.3) IDDM (ICD-250.01) -- type II; Multinodular goiter; Sickle Cell Trait FIBROCYSTIC BREAST DISEASE (ICD-610.1)  Review of Systems  The patient denies hypoglycemia.    Physical Exam  General:  obese.  no distress  Neck:  Supple without thyroid enlargement or tenderness. No cervical lymphadenopathy, neck masses or tracheal deviation.  Additional Exam:   Hemoglobin A1C       [H]  7.7 %     Impression & Recommendations:  Problem # 1:  IDDM (ICD-250.01) this is the best control this pt should aim for,  given this regimen, which does a poor job of matching insulin to her changing needs throughout the day  Other Orders: TLB-A1C / Hgb A1C (Glycohemoglobin) (83036-A1C) TLB-Microalbumin/Creat Ratio, Urine (82043-MALB) Est. Patient Level III (09811)  Patient Instructions: 1)  continue novolin 70/30 to 22 units am and 4 units pm 2)  check your blood sugar 1-2 times a day.  vary the time of day when you check, between before the 3 meals, and at bedtime.  also check if you have symptoms of your blood sugar being too high or too low.  please keep a record of the readings and bring it to your next appointment here.  please call us sooner if you are having low blood sugar episodes. 3)  Please schedule a  follow-up appointment in 3 months. 4)  (update: i left message on phone-tree:  rx as we discussed)

## 2010-04-11 NOTE — Letter (Signed)
Summary: Rebecca [name], Lumbosacral [facility] V[facility]/[employer]  Cane, Lumbosacral Support [facility] Vital [facility] System/[employer]   Imported By: Lanelle Bal 08/13/2008 11:47:28  _____________________________________________________________________  External Attachment:    Type:   Image     Comment:   External Document

## 2010-04-11 NOTE — Assessment & Plan Note (Signed)
Summary: per pt 3 mth fu---$50---stc   Vital Signs:  Patient Profile:   77 Years Old Female Weight:      198.0 pounds Temp:     97.2 degrees F oral Pulse rate:   61 / minute BP sitting:   118 / 60  (left arm) Cuff size:   large  Pt. in pain?   no  Vitals Entered By: Orlan Leavens (April 26, 2008 8:05 AM)                  PCP:  Dr. Alwyn Ren  Chief Complaint:  3 month follow-up.  History of Present Illness: pt forgot her cbg record, but says it is well-controlled.  she feels well in general. she does not notice the goiter.     Prior Medications Reviewed Using: Medication Bottles  Updated Prior Medication List: AMLODIPINE BESYLATE 5 MG  TABS (AMLODIPINE BESYLATE) take 1 by mouth daily NOVOLIN 70/30 70-30 %  SUSP (INSULIN ISOPHANE & REGULAR) 18 units qam 4 units qpm QUINAPRIL HCL 40 MG TABS (QUINAPRIL HCL) 1 by mouth once daily ACCUSURE INSULIN SYRINGE 31G X 5/16" 1 ML  MISC (INSULIN SYRINGE-NEEDLE U-100) two times a day  size and brand of patient's choice IMDUR 30 MG  TB24 (ISOSORBIDE MONONITRATE) 1 by mouth qd OMEPRAZOLE 20 MG  CPDR (OMEPRAZOLE) take 1 by mouth q am LIPITOR 10 MG  TABS (ATORVASTATIN CALCIUM) take 1 by mouth qd NITROSTAT 0.4 MG  SUBL (NITROGLYCERIN) use prn HYDROCHLOROTHIAZIDE 12.5 MG  CAPS (HYDROCHLOROTHIAZIDE) 1 once daily prn ULTRACET 37.5-325 MG TABS (TRAMADOL-ACETAMINOPHEN) 1 q4h as needed pain ASPIRIN ADULT LOW STRENGTH 81 MG TBEC (ASPIRIN) 1 by mouth once daily MELOXICAM 15 MG TABS (MELOXICAM) take 1 by mouth qd PROPOXYPHENE N-APAP 100-650 MG TABS (PROPOXYPHENE N-APAP) take 1 q 4-6 hours prn  Current Allergies (reviewed today): ! CODEINE  Past Medical History:    Reviewed history from 01/01/2008 and no changes required:       Hypertension       Coronary artery disease       Diabetes mellitus, type II; Multinodular goiter; Sickle Cell Trait     Review of Systems  The patient denies hypoglycemia.     Physical Exam  General:  obese.   Neck:     i can't tell details of the  thyroid due to obesity.  i can feel a right thyroid nodule Additional Exam:      A1C%                 [H]  8.4 %      Impression & Recommendations:  Problem # 1:  IDDM (ICD-250.01) therapy limited by pt's request for least expensive meds, and by her need for a simple regimen.  Problem # 2:  GOITER, MULTINODULAR (ICD-241.1)  Medications Added to Medication List This Visit: 1)  Novolin 70/30 70-30 % Susp (Insulin isophane & regular) .Marland Kitchen.. 19 units qam 5 units qpm 2)  Meloxicam 15 Mg Tabs (Meloxicam) .... Take 1 by mouth qd 3)  Propoxyphene N-apap 100-650 Mg Tabs (Propoxyphene n-apap) .... Take 1 q 4-6 hours prn   Patient Instructions: 1)  check your blood glucose 1 time a day.  vary the time of day between before the 3 meals and at bedtime.  also check if you feel as though your glucose might be very high or too low.  bring a record of this to your doctor appointments 2)  increase novolin 70/30 to 19 units am and 5  units pm 3)  Please schedule a follow-up appointment in 3 months. 4)  recheck thyroid US

## 2010-04-11 NOTE — Letter (Signed)
Summary: Results Follow up Letter  [facility]  [address] [place], [place] [address]   Phone: [phone]  Fax: [phone]    09/09/2007 MRN: [id number]  Ronica Hansmann [address] [place], [place]  [address]  Dear Ms. Koudelka,  The following are the results of your recent test(s):  Test         Result    Pap Smear:        Normal _____  Not Normal _____ Comments: ______________________________________________________ Cholesterol: LDL(Bad cholesterol):         Your goal is less than:         HDL (Good cholesterol):       Your goal is more than: Comments:  ______________________________________________________ Mammogram:        Normal _____  Not Normal _____ Comments:  ___________________________________________________________________ Hemoccult:        Normal __X___  Not normal _______ Comments:    _____________________________________________________________________ Other Tests:    We routinely do not discuss normal results over the telephone.  If you desire a copy of the results, or you have any questions about this information we can discuss them at your next office visit.   Sincerely,

## 2010-04-11 NOTE — Assessment & Plan Note (Signed)
  Medications Added NOVOLIN 70/30 70-30 %  SUSP (INSULIN ISOPHANE & REGULAR) 35 units qam        Vital Signs:  Patient Profile:   77 Years Old Female Weight:      194 pounds Temp:     97.3 degrees F Pulse rate:   60 / minute BP sitting:   140 / 63                 Visit Type:  Follow-up Visit PCP:  Dr. Alwyn Ren   History of Present Illness: patient states she increased her 70/30 insulin to 25 units every morning.  On this dosage, her glucoses were well controlled in the morning, but would often go as high as 300 later in the day.  she is also off her oral agents now.   Vital Signs:  Patient Profile:   78 Years Old Female Weight:      194 pounds Temp:     97.3 degrees F Pulse rate:   60 / minute BP sitting:   140 / 63   Review of Systems       denies hypoglycemia    Past Medical History:    Reviewed history from 11/26/2006 and no changes required:       Hypertension       Coronary artery disease       Diabetes mellitus, type II     Review of Systems       denies hypoglycemia   Physical Exam  General:     obese.   Psych:     alert and cooperative; normal mood and affect; normal attention span and concentration    Impression & Recommendations:  Problem # 1:  DIABETES MELLITUS, TYPE II (ICD-250.00) this patient needs the simplest possible diabetes regimen The following medications were removed from the medication list:    Amaryl 4 Mg Tabs (Glimepiride) ..... Bid    Glucophage Xr 500 Mg Tb24 (Metformin hcl) ..... Bid  Her updated medication list for this problem includes:    Novolin 70/30 70-30 % Susp (Insulin isophane & regular) .Marland KitchenMarland KitchenMarland KitchenMarland Kitchen 35 units qam   Medications Added to Medication List This Visit: 1)  Novolin 70/30 70-30 % Susp (Insulin isophane & regular) .... 35 units qam   Patient Instructions: 1)  increase 70/30 insulin to 35 units every morning 2)  continue to increase the insulin p.r.n. elevated glucoses 3)  return in a few  weeks    ]

## 2010-04-11 NOTE — Letter (Signed)
Summary: Results Follow up Letter  [facility]  [address] [place], [place] [address]   Phone: [phone]  Fax: [phone]    09/17/2008 MRN: [id number]  Rebecca [name] [address] [place], [place]  [address]  Dear [name],  The following are the results of your recent test(s):  Test         Result    Pap Smear:        Normal _____  Not Normal _____ Comments: ______________________________________________________ Cholesterol: LDL(Bad cholesterol):         Your goal is less than:         HDL (Good cholesterol):       Your goal is more than: Comments:  ______________________________________________________ Mammogram:        Normal _____  Not Normal _____ Comments:  ___________________________________________________________________ Hemoccult:        Normal _____  Not normal _______ Comments:    _____________________________________________________________________ Other Tests: PLEASE SEE ATTACHED BONE DENSITY DONE ON 08/17/2008    We routinely do not discuss normal results over the telephone.  If you desire a copy of the results, or you have any questions about this information we can discuss them at your next office visit.   Sincerely,

## 2010-04-11 NOTE — Progress Notes (Signed)
Summary: stress test    Phone Note Call from Patient Call back at Home Phone [phone]   Caller: Patient Reason for Call: Lab or Test Results Summary of Call: request results of stress test Initial call taken by: Migdalia Dk,  May 13, 2009 8:49 AM  Follow-up for Phone Call        [place]ke with pt. stress test result given. Follow-up by: Ollen Gross, RN, BSN,  May 13, 2009 8:55 AM

## 2010-04-11 NOTE — Assessment & Plan Note (Signed)
Summary: REFILL MEDICATION/CDJ   Vital Signs:  Patient Profile:   77 Years Old Female Weight:      202.4 pounds Temp:     98.1 degrees F oral Pulse rate:   64 / minute Resp:     16 per minute BP sitting:   140 / 78  (left arm) Cuff size:   large  Pt. in pain?   no  Vitals Entered By: Shonna Chock (September 02, 2007 8:10 AM)              Comments Instructed to schedule CPX near future./[facility]  September 02, 2007 8:12 AM      PCP:  Dr. Alwyn Ren  Chief Complaint:  medication follow-up.  History of Present Illness: She saw Dr Everardo All in 5/09; A1c was 8%(?). Actually A1c was 8.4% (labs & OV reviewed) ; risk of almost 70% increase for CVA or MI discussed. No CVE due to foot pain.Systolic was 159 @ last visit; present A1c (probably)"best control she can obtain" She had seen Nutritionist; but is not on specific diet except "cut back on white stuff". No changes made in meds(his note states she declined increase in BP meds) HTN, Dyspepsia& Lipid Management reviewed.  Dyspepsia History:      She has no alarm features of dyspepsia including no history of melena, hematochezia, dysphagia, persistent vomiting, or involuntary weight loss > 5%.  There is a prior history of GERD.  The patient does not have a prior history of documented ulcer disease.  The dominant symptom is heartburn or acid reflux.  An H-2 blocker medication is not currently being taken.  She has no history of a positive H. Pylori serology.  A prior EGD has been done which showed no evidence for moderate or severe esophagitis or Barrett's esophagus.    Hypertension History:      She denies headache, chest pain, palpitations, dyspnea with exertion, orthopnea, PND, peripheral edema, visual symptoms, neurologic problems, syncope, and side effects from treatment.  She notes no problems with any antihypertensive medication side effects.  Further comments include: BP @ drug store "normal ... in  130/50s; always high @ MD appt". L ankle  swells; "arch & tendons bother me" ; being seen by Podiatrist.        Positive major cardiovascular risk factors include female age 68 years old or older, diabetes, hyperlipidemia, hypertension, and family history for ischemic heart disease (females less than 16 years old).  Negative major cardiovascular risk factors include non-tobacco-user status.        Positive history for target organ damage include ASHD (either angina; prior MI; prior CABG).  Further assessment for target organ damage reveals no history of stroke/TIA or peripheral vascular disease.    Lipid Management History:      Positive NCEP/ATP III risk factors include female age 88 years old or older, diabetes, family history for ischemic heart disease (females less than 60 years old), hypertension, and ASHD (atherosclerotic heart disease).  Negative NCEP/ATP III risk factors include HDL cholesterol greater than 60, non-tobacco-user status, no prior stroke/TIA, no peripheral vascular disease, and no history of aortic aneurysm.         Current Allergies (reviewed today): ! CODEINE    Risk Factors:  Alcohol use:  no Exercise:  yes    Times per week:  2-3    Type:  walking  Family History Risk Factors:    Family History of MI in females < 62 years old:  yes  Family History of MI in males < 3 years old:  no    Physical Exam  General:     Well-developed,well-nourished,in no acute distress; alert, cooperative throughout examination; overweight-appearing. Some element of denial suggested  Lungs:     Normal respiratory effort, chest expands symmetrically. Lungs are clear to auscultation, no crackles or wheezes. Heart:     normal rate, regular rhythm, no gallop, no rub, no JVD, no HJR, and grade 1 /6 systolic murmur.   Abdomen:     Bowel sounds positive,abdomen soft and non-tender without masses, organomegaly or hernias noted. Msk:     No deformity or scoliosis noted of thoracic or lumbar spine.   Pulses:     R and L  carotid,radial,dorsalis pedis and posterior tibial pulses are full and equal bilaterally Extremities:     trace left pedal edema.  No edema on R. Severe pes planus Neurologic:     alert & oriented X3 and DTRs symmetrical and normal.   Skin:     Intact without suspicious lesions or rashes Psych:     memory intact for recent and remote.   Non compliance suggested ( risk discussed)    Impression & Recommendations:  Problem # 1:  HYPERTENSION (ICD-401.9)  Her updated medication list for this problem includes:    Amlodipine Besylate 5 Mg Tabs (Amlodipine besylate) .Marland Kitchen... Take 1 by mouth daily    Quinapril Hcl 40 Mg Tabs (Quinapril hcl) .Marland Kitchen... 1 by mouth once daily    Hydrochlorothiazide 12.5 Mg Caps (Hydrochlorothiazide) .Marland Kitchen... 1 qd  Orders: TLB-Creatinine, Blood (82565-CREA) TLB-Potassium (K+) (84132-K) TLB-BUN (Urea Nitrogen) (84520-BUN)   Problem # 2:  HYPERCHOLESTEROLEMIA (ICD-272.0)  Her updated medication list for this problem includes:    Lipitor 10 Mg Tabs (Atorvastatin calcium) .Marland Kitchen... Take 1 by mouth qd  Orders: TLB-Lipid Panel (80061-LIPID) TLB-Hepatic/Liver Function Pnl (80076-HEPATIC)   Problem # 3:  ESOPHAGEAL REFLUX (ICD-530.81)  Her updated medication list for this problem includes:    Omeprazole 20 Mg Cpdr (Omeprazole) .Marland Kitchen... Take 1 by mouth q am  Orders: TLB-CBC Platelet - w/Differential (85025-CBCD)   Problem # 4:  IDDM (ICD-250.01) as per Dr Everardo All Her updated medication list for this problem includes:    Novolin 70/30 70-30 % Susp (Insulin isophane & regular) .Marland KitchenMarland KitchenMarland KitchenMarland Kitchen 18 units qam 5 units qpm    Quinapril Hcl 40 Mg Tabs (Quinapril hcl) .Marland Kitchen... 1 by mouth once daily   Problem # 5:  CORONARY ARTERY DISEASE (ICD-414.00)  Her updated medication list for this problem includes:    Amlodipine Besylate 5 Mg Tabs (Amlodipine besylate) .Marland Kitchen... Take 1 by mouth daily    Quinapril Hcl 40 Mg Tabs (Quinapril hcl) .Marland Kitchen... 1 by mouth once daily    Imdur 30 Mg Tb24  (Isosorbide mononitrate) .Marland Kitchen... 1 by mouth qd    Nitrostat 0.4 Mg Subl (Nitroglycerin) ..... Use prn    Hydrochlorothiazide 12.5 Mg Caps (Hydrochlorothiazide) .Marland Kitchen... 1 qd   Complete Medication List: 1)  Amlodipine Besylate 5 Mg Tabs (Amlodipine besylate) .... Take 1 by mouth daily 2)  Novolin 70/30 70-30 % Susp (Insulin isophane & regular) .Marland Kitchen.. 18 units qam 5 units qpm 3)  Quinapril Hcl 40 Mg Tabs (Quinapril hcl) .Marland Kitchen.. 1 by mouth once daily 4)  Accusure Insulin Syringe 31g X 5/16" 1 Ml Misc (Insulin syringe-needle u-100) .... Two times a day  size and brand of patient's choice 5)  Imdur 30 Mg Tb24 (Isosorbide mononitrate) .Marland Kitchen.. 1 by mouth qd 6)  Promethazine Hcl 25 Mg Tabs (  Promethazine hcl) .... 1/2 to 1 by mouth three times a day as needed nausea 7)  Omeprazole 20 Mg Cpdr (Omeprazole) .... Take 1 by mouth q am 8)  Lipitor 10 Mg Tabs (Atorvastatin calcium) .... Take 1 by mouth qd 9)  Nitrostat 0.4 Mg Subl (Nitroglycerin) .... Use prn 10)  Hydrochlorothiazide 12.5 Mg Caps (Hydrochlorothiazide) .Marland Kitchen.. 1 qd  Hypertension Assessment/Plan:      The patient's hypertensive risk group is category C: Target organ damage and/or diabetes.  Today's blood pressure is 140/78.    Lipid Assessment/Plan:      Based on NCEP/ATP III, the patient's risk factor category is "history of coronary disease, peripheral vascular disease, cerebrovascular disease, or aortic aneurysm along with either diabetes, current smoker, or LDL > 130 plus HDL < 40 plus triglycerides > 200".  From this information, the patient's calculated lipid goals are as follows: Total cholesterol goal is 200; LDL cholesterol goal is 70; HDL cholesterol goal is 40; Triglyceride goal is 150.  Her LDL cholesterol goal has not been met.  Secondary causes for hyperlipidemia have been ruled out.  She has been counseled on adjunctive measures for lowering her cholesterol and has been provided with dietary instructions.     Patient Instructions: 1)  Avoid  white carbs & High Fructose Corn Syrup in food or drinks. See The New Sugar Busters low carb diet. Complete stool cards   Prescriptions: HYDROCHLOROTHIAZIDE 12.5 MG  CAPS (HYDROCHLOROTHIAZIDE) 1 qd  #30 x 5   Entered and Authorized by:   Marga Melnick MD   Signed by:   Marga Melnick MD on 09/02/2007   Method used:   Electronically sent to ...       [facility]       [address]       [place], [place]  [address]       Ph: [phone]       Fax: [phone]   RxID:   [id number] QUINAPRIL HCL 40 MG TABS (QUINAPRIL HCL) 1 by mouth once daily  #90 x 3   Entered and Authorized by:   Marga Melnick MD   Signed by:   Marga Melnick MD on 09/02/2007   Method used:   Electronically sent to ...       [facility]       [address]       [place], [place]  [address]       Ph: [phone]       Fax: [phone]   RxID:   [id number] LIPITOR 10 MG  TABS (ATORVASTATIN CALCIUM) take 1 by mouth qd  #30 x 5   Entered and Authorized by:   Marga Melnick MD   Signed by:   Marga Melnick MD on 09/02/2007   Method used:   Electronically sent to ...       [facility]       [address]       [place], [place]  [address]       Ph: [phone]       Fax: [phone]   RxID:   [id number] AMLODIPINE BESYLATE 5 MG  TABS (AMLODIPINE BESYLATE) take 1 by mouth daily  #30 Each x 11   Entered and Authorized by:   Marga Melnick MD   Signed by:   Marga Melnick  MD on 09/02/2007   Method used:   Electronically sent to ...       [facility]       [address]       [place], [place]  [address]       Ph: [phone]       Fax: [phone]   RxID:   [id number] IMDUR 30 MG  TB24 (ISOSORBIDE MONONITRATE) 1 by mouth qd  #30 x 11   Entered and Authorized by:   Marga Melnick MD   Signed by:   Marga Melnick MD on 09/02/2007   Method used:   Electronically sent to  ...       [facility]       [address]       [place], [place]  [address]       Ph: [phone]       Fax: [phone]   RxID:   [id number] OMEPRAZOLE 20 MG  CPDR (OMEPRAZOLE) take 1 by mouth q am  #30 Each x 11   Entered and Authorized by:   Marga Melnick MD   Signed by:   Marga Melnick MD on 09/02/2007   Method used:   Electronically sent to ...       [facility]       [address]       [place], [place]  [address]       Ph: [phone]       Fax: [phone]   RxID:   [id number]  ]

## 2010-04-11 NOTE — Assessment & Plan Note (Signed)
Summary: STOMACH PAIN FROM VOMIT'G SINCE TUE NIGHT-STC   Vital Signs:  Patient Profile:   77 Years Old Female Temp:     97 degrees F Pulse rate:   66 / minute BP supine:   144 / 76                 PCP:  Dr. Alwyn Ren   History of Present Illness: has had a virus- and cannot get stomach straightened out started with diarrhea tues nt after supper and then started vomiting all nt long  now vomiting is better but gets nauseated if she eats is able to slowly drink, however got some liquid antacid yesterday- and it helped some if she does not eat enough- her sugar falls this am took some antacid again diarrhea is better  did have chills at home- so suspect fever and took tylenol    Current Allergies: ! CODEINE     Review of Systems      See HPI  Eyes      Denies eye irritation.  ENT      Denies nasal congestion, postnasal drainage, and sore throat.  CV      Denies chest pain or discomfort.  GU      Denies dysuria.  Derm      Denies rash.   Physical Exam  General:     Well-developed,well-nourished,in no acute distress; alert,appropriate and cooperative throughout examination Head:     normocephalic, atraumatic, and no abnormalities observed.   Eyes:     vision grossly intact, pupils equal, pupils round, and pupils reactive to light.   Nose:     no nasal discharge.   Mouth:     pharynx pink and moist.   Neck:     No deformities, masses, or tenderness noted. Lungs:     Normal respiratory effort, chest expands symmetrically. Lungs are clear to auscultation, no crackles or wheezes. Heart:     Normal rate and regular rhythm. S1 and S2 normal without gallop, murmur, click, rub or other extra sounds. Abdomen:     mild epigastric tenderness without gaurding or rebound soft, normal bowel sounds, no distention, no masses, no hepatomegaly, and no splenomegaly.   Skin:     Intact without suspicious lesions or rashes Cervical Nodes:     No lymphadenopathy  noted Psych:     nl affect    Impression & Recommendations:  Problem # 1:  GASTROENTERITIS (ICD-558.9) viral and improving will continue gentle fluid rehydration- adv slowly to bland diet watch blood sugar phenergan as needed with caution update if no imp rev s/s of dehydration to watch fof  Complete Medication List: 1)  Amlodipine Besylate 5 Mg Tabs (Amlodipine besylate) .... Take 1 by mouth daily 2)  Novolin 70/30 70-30 % Susp (Insulin isophane & regular) .Marland Kitchen.. 18 units qam 5 units qpm 3)  Quinapril Hcl 40 Mg Tabs (Quinapril hcl) 4)  Accusure Insulin Syringe 31g X 5/16" 1 Ml Misc (Insulin syringe-needle u-100) .... Two times a day  size and brand of patient's choice 5)  Imdur 30 Mg Tb24 (Isosorbide mononitrate) .Marland Kitchen.. 1 by mouth qd 6)  Promethazine Hcl 25 Mg Tabs (Promethazine hcl) .... 1/2 to 1 by mouth three times a day as needed nausea   Patient Instructions: 1)  continue with sips of fluids 2)  gradually advance to bland diet 3)  antacid is ok 4)  watch your blood sugar 5)  take phenergan for nausea only if you need it- caution with  sedation 6)  update if worse or not improving in next 3 days    Prescriptions: PROMETHAZINE HCL 25 MG  TABS (PROMETHAZINE HCL) 1/2 to 1 by mouth three times a day as needed nausea  #10 x 0   Entered and Authorized by:   Judith Part MD   Signed by:   Judith Part MD on 04/12/2007   Method used:   Print then Give to Patient   RxID:   [id number]  ]

## 2010-04-11 NOTE — Letter (Signed)
Summary: CMN for Diabetic Shoes/[facility]  CMN for Diabetic Shoes/[facility]   Imported By: Lanelle Bal 12/17/2008 10:20:58  _____________________________________________________________________  External Attachment:    Type:   Image     Comment:   External Document

## 2010-04-11 NOTE — Medication Information (Signed)
Summary: Therapeutic shoes/[facility]  Therapeutic shoes/[facility]   Imported By: Lester [place] 11/26/2008 10:33:04  _____________________________________________________________________  External Attachment:    Type:   Image     Comment:   External Document

## 2010-04-11 NOTE — Miscellaneous (Signed)
  Clinical Lists Changes  Medications: Changed medication from NOVOLIN 70/30 70-30 %  SUSP (INSULIN ISOPHANE & REGULAR) 35 units qam to NOVOLIN 70/30 70-30 %  SUSP (INSULIN ISOPHANE & REGULAR) 18 units qam 5 units qpm - Signed Removed medication of AMARYL 4 MG TABS (GLIMEPIRIDE) Take 1 tablet by mouth twice a day Removed medication of METFORMIN HCL 500 MG TB24 (METFORMIN HCL) Take 2 tablet by mouth twice a day Added new medication of ACCUSURE INSULIN SYRINGE 31G X 5/16" 1 ML  MISC (INSULIN SYRINGE-NEEDLE U-100) two times a day  size and brand of patient's choice - Signed Removed medication of HUMULIN N 100 UNIT/ML SUSP (INSULIN ISOPHANE HUMAN) Rx of ACCUSURE INSULIN SYRINGE 31G X 5/16" 1 ML  MISC (INSULIN SYRINGE-NEEDLE U-100) two times a day  size and brand of patient's choice;  #60 x 11;  Signed;  Entered by: Minus Breeding MD;  Authorized by: Minus Breeding MD;  Method used: Electronic Rx of NOVOLIN 70/30 70-30 %  SUSP (INSULIN ISOPHANE & REGULAR) 18 units qam 5 units qpm;  #1 x 11;  Signed;  Entered by: Minus Breeding MD;  Authorized by: Minus Breeding MD;  Method used: Electronic    Prescriptions: NOVOLIN 70/30 70-30 %  SUSP (INSULIN ISOPHANE & REGULAR) 18 units qam 5 units qpm  #1 x 11   Entered and Authorized by:   Minus Breeding MD   Signed by:   Minus Breeding MD on 02/03/2007   Method used:   Electronically sent to ...       [facility]  [address]       [address]       [place], [place]  [address]       Ph: [phone]       Fax: [phone]   RxID:   [id number] ACCUSURE INSULIN SYRINGE 31G X 5/16" 1 ML  MISC (INSULIN SYRINGE-NEEDLE U-100) two times a day  size and brand of patient's choice  #60 x 11   Entered and Authorized by:   Minus Breeding MD   Signed by:   Minus Breeding MD on 02/03/2007   Method used:   Electronically sent to ...       [facility]  [address]       [address]       [place], [place]  [address]       Ph: [phone]       Fax: [phone]   RxID:   [id number]

## 2010-04-11 NOTE — Progress Notes (Signed)
   Walk in Patient Form Recieved " pt Had Stress test, and discharged from hospital does she need to keep appt?" forwarded to lauren  [facility]  May 11, 2009 9:24 AM'

## 2010-04-11 NOTE — Progress Notes (Signed)
Summary: Request for Rheumatology  Phone Note Call from Patient Call back at Home Phone [phone]   Caller: Patient Summary of Call: Message left on Voicemail: Patient would like referral to Rheumatologist(stiffness in legs/feet), patient tried to set up on her own but they need her primary Dr. to order referral for Dr. in her network./[facility]  November 29, 2008 1:05 PM   Follow-up for Phone Call        Order placed./[facility]rae [facility]  November 29, 2008 1:09 PM

## 2010-04-11 NOTE — Consult Note (Signed)
Summary: [facility]  [facility]   Imported By: Lanelle Bal 12/16/2008 12:17:38  _____________________________________________________________________  External Attachment:    Type:   Image     Comment:   External Document

## 2010-04-11 NOTE — Medication Information (Signed)
Summary: Diabetic Supply Order/[facility]  Diabetic Supply Order/[facility]   Imported By: Sherian Rein 06/01/2008 10:27:35  _____________________________________________________________________  External Attachment:    Type:   Image     Comment:   External Document

## 2010-04-13 NOTE — Assessment & Plan Note (Signed)
Summary: 4 MTH FU STC   Vital Signs:  Patient profile:   77 year old female Height:      65 inches (165.10 cm) Weight:      209.13 pounds (95.06 kg) BMI:     34.93 O2 Sat:      98 % on Room air Temp:     98.5 degrees F (36.94 degrees C) oral Pulse rate:   70 / minute Pulse rhythm:   regular BP sitting:   124 / 68  (left arm) Cuff size:   large  Vitals Entered By: Brenton Grills CMA Duncan Dull) (March 30, 2010 8:09 AM)  O2 Flow:  Room air CC: 4 month F/U/aj Is Patient Diabetic? Yes   Primary Provider:  Dr. Alwyn Ren  CC:  4 month F/U/aj.  History of Present Illness: pt states she feels well in general.  she brings a record of her cbg's which i have reviewed today.  it varies from 90-200, with no trend throughout the day.  most cbg's are in the low-100's.    Current Medications (verified): 1)  Amlodipine Besylate 10 Mg Tabs (Amlodipine Besylate) .Marland Kitchen.. 1 By Mouth Once Daily (Dose Was Increased in The Hospital) 2)  Novolin 70/30 70-30 %  Susp (Insulin Isophane & Regular) .Marland Kitchen.. 15 Units Qam 6 Units Qpm 3)  Quinapril Hcl 40 Mg Tabs (Quinapril Hcl) .Marland Kitchen.. 1 By Mouth Once Daily 4)  Accusure Insulin Syringe 31g X 5/16" 1 Ml  Misc (Insulin Syringe-Needle U-100) .... Two Times A Day  Size and Brand of Patient's Choice 5)  Imdur 30 Mg  Tb24 (Isosorbide Mononitrate) .Marland Kitchen.. 1 By Mouth Qd 6)  Omeprazole 20 Mg  Cpdr (Omeprazole) .... Take 1 By Mouth Q Am 7)  Lipitor 10 Mg  Tabs (Atorvastatin Calcium) .... Take 1 By Mouth Once Daily **appointment Due** 8)  Nitrostat 0.4 Mg  Subl (Nitroglycerin) .Marland Kitchen.. 1 By Mouth As Directed For Chest Pain 9)  Hydrochlorothiazide 12.5 Mg  Caps (Hydrochlorothiazide) .Marland Kitchen.. 1 Once Daily Prn 10)  Aspirin Adult Low Strength 81 Mg Tbec (Aspirin) .Marland Kitchen.. 1 By Mouth Once Daily 11)  Calcium 600 1500 Mg Tabs (Calcium Carbonate) .... 2 By Mouth Once Daily 12)  Vitamin E Complex 400 Unit Caps (Vitamin E) .Marland Kitchen.. 1 By Mouth Once Daily 13)  Cod Liver Oil  Caps (Cod Liver Oil) .Marland Kitchen.. 1 By Mouth  Once Daily Durning The Winter 14)  Vitamin C 500 Mg Tabs (Ascorbic Acid) .Marland Kitchen.. 1 By Mouth Twice Daily 15)  Vitamin D 5000 Unit Tabs (Cholecalciferol) .Marland Kitchen.. 1 By Mouth Once Daily 16)  Multivitamins  Tabs (Multiple Vitamin) .Marland Kitchen.. 1 By Mouth Once Daily  Allergies (verified): 1)  ! Codeine  Past History:  Past Medical History: Last updated: 08/02/2008 CORONARY ARTERY DISEASE (ICD-414.00) HYPERCHOLESTEROLEMIA (ICD-272.0) HYPERTENSION (ICD-401.9) SICKLE-CELL TRAIT (ICD-282.5) UNSPECIFIED ANEMIA (ICD-285.9) ANKLE PAIN, LEFT (ICD-719.47) ESOPHAGEAL REFLUX (ICD-530.81) GOITER, MULTINODULAR (ICD-241.1) HIATAL HERNIA (ICD-553.3) IDDM (ICD-250.01) -- type II; Multinodular goiter; Sickle Cell Trait FIBROCYSTIC BREAST DISEASE (ICD-610.1)  Review of Systems  The patient denies hypoglycemia.    Physical Exam  General:  obese.  no distress  Pulses:  dorsalis pedis intact bilat.  no carotid bruit Extremities:  right foot:  no deformity.  no ulcer.  right foot is of normal color and temp.  no edema. Neurologic:  sensation is intact to touch on the feet  Additional Exam:  Hemoglobin A1C       [H]  8.1 %    Impression & Recommendations:  Problem # 1:  IDDM (ICD-250.01) needs increased rx this is the almost best control this pt should aim for, given this regimen, which does match insulin to her changing needs throughout the day  Medications Added to Medication List This Visit: 1)  Novolin 70/30 70-30 % Susp (Insulin isophane & regular) .Marland Kitchen.. 15 units qam 7 units qpm  Other Orders: TLB-A1C / Hgb A1C (Glycohemoglobin) (83036-A1C) Est. Patient Level III (04540)  Patient Instructions: 1)  tests are being ordered for you today.  a few days after the test(s), please call [phone] to hear your test results. 2)  check your blood sugar 1-2 times a day.  vary the time of day when you check, between before the 3 meals, and at bedtime.  also check if you have symptoms of your blood sugar being too  high or too low.  please keep a record of the readings and bring it to your next appointment here.  please call us sooner if you are having low blood sugar episodes. 3)  Please schedule a follow-up appointment in 4 months. 4)  pending the test results, please continue insulin 15 units with breakfast and 6 units with the evening meal.   5)  (update: i left message on phone-tree:  increase pm insulin to 7 units).   Orders Added: 1)  TLB-A1C / Hgb A1C (Glycohemoglobin) [83036-A1C] 2)  Est. Patient Level III [78295]

## 2010-04-19 NOTE — Progress Notes (Signed)
Summary: Rx req  Phone Note Call from Patient Call back at Home Phone [phone]   Caller: Patient Summary of Call: Pt called stating she has been experiencing elevated CBGs in the early afternoon 180-220. Pt is requesting Rx for Amaryl to help control blood sugars, please advise.  Pt has increased evening insulin to 7units as advised. Initial call taken by: Margaret Pyle, CMA,  April 10, 2010 10:09 AM  Follow-up for Phone Call        increase am insulin to 20 units.  ret 2 weeks. Follow-up by: Minus Breeding MD,  April 10, 2010 10:11 AM  Additional Follow-up for Phone Call Additional follow up Details #1::        Pt informed Additional Follow-up by: Margaret Pyle, CMA,  April 10, 2010 1:12 PM    New/Updated Medications: NOVOLIN 70/30 70-30 %  SUSP (INSULIN ISOPHANE & REGULAR) 20 units qam 7 units qpm

## 2010-04-26 ENCOUNTER — Encounter: Payer: Self-pay | Admitting: Endocrinology

## 2010-05-01 ENCOUNTER — Encounter: Payer: Self-pay | Admitting: Endocrinology

## 2010-05-03 NOTE — Medication Information (Signed)
Summary: Diabetes Supplies/[facility]  Diabetes Supplies/[facility]   Imported By: Sherian Rein 04/28/2010 14:13:55  _____________________________________________________________________  External Attachment:    Type:   Image     Comment:   External Document

## 2010-05-18 NOTE — Letter (Signed)
Summary: [facility]   Imported By: Sherian Rein 05/08/2010 12:28:03  _____________________________________________________________________  External Attachment:    Type:   Image     Comment:   External Document

## 2010-05-30 ENCOUNTER — Other Ambulatory Visit: Payer: Self-pay

## 2010-05-30 MED ORDER — INSULIN NPH ISOPHANE & REGULAR (70-30) 100 UNIT/ML ~~LOC~~ SUSP: SUBCUTANEOUS | Status: DC

## 2010-06-01 LAB — POCT CARDIAC MARKERS
CKMB, poc: 2.2 ng/mL (ref 1.0–8.0)
Myoglobin, poc: 115 ng/mL (ref 12–200)
Troponin i, poc: <0.05 ng/mL (ref 0.00–0.09)

## 2010-06-01 LAB — GLUCOSE, CAPILLARY
Glucose-Capillary: 121 mg/dL — ABNORMAL HIGH (ref 70–99)
Glucose-Capillary: 133 mg/dL — ABNORMAL HIGH (ref 70–99)
Glucose-Capillary: 139 mg/dL — ABNORMAL HIGH (ref 70–99)
Glucose-Capillary: 142 mg/dL — ABNORMAL HIGH (ref 70–99)
Glucose-Capillary: 145 mg/dL — ABNORMAL HIGH (ref 70–99)
Glucose-Capillary: 161 mg/dL — ABNORMAL HIGH (ref 70–99)
Glucose-Capillary: 174 mg/dL — ABNORMAL HIGH (ref 70–99)
Glucose-Capillary: 176 mg/dL — ABNORMAL HIGH (ref 70–99)
Glucose-Capillary: 225 mg/dL — ABNORMAL HIGH (ref 70–99)
Glucose-Capillary: 226 mg/dL — ABNORMAL HIGH (ref 70–99)
Glucose-Capillary: 228 mg/dL — ABNORMAL HIGH (ref 70–99)
Glucose-Capillary: 240 mg/dL — ABNORMAL HIGH (ref 70–99)
Glucose-Capillary: 274 mg/dL — ABNORMAL HIGH (ref 70–99)
Glucose-Capillary: 331 mg/dL — ABNORMAL HIGH (ref 70–99)
Glucose-Capillary: 37 mg/dL — CL (ref 70–99)
Glucose-Capillary: 92 mg/dL (ref 70–99)

## 2010-06-01 LAB — COMPREHENSIVE METABOLIC PANEL
ALT: 17 U/L (ref 0–35)
AST: 18 U/L (ref 0–37)
Albumin: 3.2 g/dL — ABNORMAL LOW (ref 3.5–5.2)
Alkaline Phosphatase: 54 U/L (ref 39–117)
BUN: 26 mg/dL — ABNORMAL HIGH (ref 6–23)
CO2: 24 mEq/L (ref 19–32)
Calcium: 8.9 mg/dL (ref 8.4–10.5)
Chloride: 104 mEq/L (ref 96–112)
Creatinine, Ser: 1.27 mg/dL — ABNORMAL HIGH (ref 0.4–1.2)
GFR calc Af Amer: 50 mL/min — ABNORMAL LOW (ref >60)
GFR calc non Af Amer: 41 mL/min — ABNORMAL LOW (ref >60)
Glucose, Bld: 256 mg/dL — ABNORMAL HIGH (ref 70–99)
Potassium: 4.2 mEq/L (ref 3.5–5.1)
Sodium: 136 mEq/L (ref 135–145)
Total Bilirubin: 0.3 mg/dL (ref 0.3–1.2)
Total Protein: 6.2 g/dL (ref 6.0–8.3)

## 2010-06-01 LAB — POCT I-STAT, CHEM 8
BUN: 28 mg/dL — ABNORMAL HIGH (ref 6–23)
Calcium, Ion: 1.11 mmol/L — ABNORMAL LOW (ref 1.12–1.32)
Chloride: 109 mEq/L (ref 96–112)
Creatinine, Ser: 1.2 mg/dL (ref 0.4–1.2)
Glucose, Bld: 227 mg/dL — ABNORMAL HIGH (ref 70–99)
HCT: 35 % — ABNORMAL LOW (ref 36.0–46.0)
Hemoglobin: 11.9 g/dL — ABNORMAL LOW (ref 12.0–15.0)
Potassium: 4.3 mEq/L (ref 3.5–5.1)
Sodium: 138 mEq/L (ref 135–145)
TCO2: 24 mmol/L (ref 0–100)

## 2010-06-01 LAB — CBC
HCT: 33 % — ABNORMAL LOW (ref 36.0–46.0)
HCT: 34.4 % — ABNORMAL LOW (ref 36.0–46.0)
HCT: 34.5 % — ABNORMAL LOW (ref 36.0–46.0)
Hemoglobin: 11.2 g/dL — ABNORMAL LOW (ref 12.0–15.0)
Hemoglobin: 11.4 g/dL — ABNORMAL LOW (ref 12.0–15.0)
Hemoglobin: 11.7 g/dL — ABNORMAL LOW (ref 12.0–15.0)
MCHC: 33 g/dL (ref 30.0–36.0)
MCHC: 33.8 g/dL (ref 30.0–36.0)
MCHC: 33.9 g/dL (ref 30.0–36.0)
MCV: 80.8 fL (ref 78.0–100.0)
MCV: 81.8 fL (ref 78.0–100.0)
MCV: 81.9 fL (ref 78.0–100.0)
Platelets: 189 10*3/uL (ref 150–400)
Platelets: 193 10*3/uL (ref 150–400)
Platelets: 206 10*3/uL (ref 150–400)
RBC: 4.08 MIL/uL (ref 3.87–5.11)
RBC: 4.21 MIL/uL (ref 3.87–5.11)
RBC: 4.22 MIL/uL (ref 3.87–5.11)
RDW: 15.5 % (ref 11.5–15.5)
RDW: 15.7 % — ABNORMAL HIGH (ref 11.5–15.5)
RDW: 16.1 % — ABNORMAL HIGH (ref 11.5–15.5)
WBC: 4.9 10*3/uL (ref 4.0–10.5)
WBC: 5.2 10*3/uL (ref 4.0–10.5)
WBC: 5.4 10*3/uL (ref 4.0–10.5)

## 2010-06-01 LAB — PROTIME-INR
INR: 0.96 (ref 0.00–1.49)
Prothrombin Time: 12.7 seconds (ref 11.6–15.2)

## 2010-06-01 LAB — BASIC METABOLIC PANEL
BUN: 21 mg/dL (ref 6–23)
CO2: 23 mEq/L (ref 19–32)
Calcium: 8.8 mg/dL (ref 8.4–10.5)
Chloride: 107 mEq/L (ref 96–112)
Creatinine, Ser: 1.02 mg/dL (ref 0.4–1.2)
GFR calc Af Amer: >60 mL/min (ref >60)
GFR calc non Af Amer: 53 mL/min — ABNORMAL LOW (ref >60)
Glucose, Bld: 142 mg/dL — ABNORMAL HIGH (ref 70–99)
Potassium: 4 mEq/L (ref 3.5–5.1)
Sodium: 136 mEq/L (ref 135–145)

## 2010-06-01 LAB — CARDIAC PANEL(CRET KIN+CKTOT+MB+TROPI)
CK, MB: 2.4 ng/mL (ref 0.3–4.0)
Relative Index: 2.3 (ref 0.0–2.5)
Total CK: 105 U/L (ref 7–177)
Troponin I: 0.02 ng/mL (ref 0.00–0.06)

## 2010-06-01 LAB — BRAIN NATRIURETIC PEPTIDE: Pro B Natriuretic peptide (BNP): 45 pg/mL (ref 0.0–100.0)

## 2010-06-01 LAB — DIFFERENTIAL
Basophils Absolute: 0 10*3/uL (ref 0.0–0.1)
Basophils Relative: 0 % (ref 0–1)
Eosinophils Absolute: 0.1 10*3/uL (ref 0.0–0.7)
Eosinophils Relative: 3 % (ref 0–5)
Lymphocytes Relative: 31 % (ref 12–46)
Lymphs Abs: 1.7 10*3/uL (ref 0.7–4.0)
Monocytes Absolute: 0.5 10*3/uL (ref 0.1–1.0)
Monocytes Relative: 9 % (ref 3–12)
Neutro Abs: 3.1 10*3/uL (ref 1.7–7.7)
Neutrophils Relative %: 58 % (ref 43–77)

## 2010-06-01 LAB — HEPARIN LEVEL (UNFRACTIONATED)
Heparin Unfractionated: 0.62 IU/mL (ref 0.30–0.70)
Heparin Unfractionated: 0.66 IU/mL (ref 0.30–0.70)
Heparin Unfractionated: 0.77 IU/mL — ABNORMAL HIGH (ref 0.30–0.70)

## 2010-06-01 LAB — TSH: TSH: 1.101 u[IU]/mL (ref 0.350–4.500)

## 2010-06-01 LAB — LIPID PANEL
Cholesterol: 169 mg/dL (ref 0–200)
HDL: 86 mg/dL (ref >39)
LDL Cholesterol: 76 mg/dL (ref 0–99)
Total CHOL/HDL Ratio: 2 RATIO
Triglycerides: 34 mg/dL (ref <150)
VLDL: 7 mg/dL (ref 0–40)

## 2010-06-01 LAB — HEMOGLOBIN A1C
Hgb A1c MFr Bld: 7.1 % — ABNORMAL HIGH (ref 4.6–6.1)
Mean Plasma Glucose: 157 mg/dL

## 2010-06-01 LAB — APTT: aPTT: 32 seconds (ref 24–37)

## 2010-06-08 ENCOUNTER — Telehealth: Payer: Self-pay | Admitting: Endocrinology

## 2010-06-08 ENCOUNTER — Encounter: Payer: Self-pay | Admitting: Internal Medicine

## 2010-06-08 NOTE — Telephone Encounter (Signed)
A user error has taken place: encounter opened in error, closed for administrative reasons.

## 2010-06-08 NOTE — Telephone Encounter (Signed)
Pt states [screaming message], "I HAVE CALLED THIS NUMBER & CALLED THIS NUMBER AND I CAN'T SEEM TO GET A MSG THROUGH WHEN I NEED TO. THE ONLY THING I WAS ASKING DR Lynita Lombard FOR WAS TO WRITE ME A PRESCRIPTION FOR DIABETIC SHOES. I TOLD HIM WHAT HAS HAPPENED - MY FOOT HAS COLLAPSED - GOT A FOOT BRACE ON IT. IT'S SO HARD TO GET THROUGH TO YOU. THE FAX HAS BEEN SENT TO YOU FROM [[facility]?] AND STILL NO ANSWER. I WENT TO GET MEASURED UP TODAY ND I COULDN'T. IF DR Everardo All IS NOT GONNA BE MY DOCTOR LET ME KNOW AND I'LL GET ANOTHER DOCTOR. I'M TIRED OF CALLING YOU".

## 2010-06-09 NOTE — Telephone Encounter (Signed)
Per SEA's CMA, supplier has been notified that pt does not qualify for Diabetic shoes and states that they will inform pt

## 2010-06-10 ENCOUNTER — Encounter: Payer: Self-pay | Admitting: Internal Medicine

## 2010-06-12 ENCOUNTER — Ambulatory Visit (INDEPENDENT_AMBULATORY_CARE_PROVIDER_SITE_OTHER): Payer: Medicare Other | Admitting: Internal Medicine

## 2010-06-12 ENCOUNTER — Encounter: Payer: Self-pay | Admitting: Internal Medicine

## 2010-06-12 DIAGNOSIS — G8929 Other chronic pain: Secondary | ICD-10-CM

## 2010-06-12 DIAGNOSIS — M25579 Pain in unspecified ankle and joints of unspecified foot: Secondary | ICD-10-CM

## 2010-06-12 DIAGNOSIS — M549 Dorsalgia, unspecified: Secondary | ICD-10-CM

## 2010-06-12 DIAGNOSIS — F411 Generalized anxiety disorder: Secondary | ICD-10-CM

## 2010-06-12 MED ORDER — TRAMADOL HCL 50 MG PO TABS: 50.0000 mg | ORAL_TABLET | Freq: Four times a day (QID) | ORAL | Status: DC | PRN

## 2010-06-12 MED ORDER — LORAZEPAM 0.5 MG PO TABS: 0.5000 mg | ORAL_TABLET | Freq: Three times a day (TID) | ORAL | Status: DC | PRN

## 2010-06-12 NOTE — Progress Notes (Signed)
  Subjective:    Patient ID: Rebecca [name], female    DOB: 03-21-1932, 77 y.o.   MRN: [id number]  HPI  She believes that her diabetes is poorly controlled in great part due to pain in her left ankle and back. She sees a podiatrist who has prescribed an ankle brace. She also sees Dr. Shon Baton an orthopedic back specialist. Her last A1c was 8.1; her endocrinologist increased her insulin doses.  She has no pain medication at this time. She  gives  a history of "codeine makes me drunk". She has never had a rash or fever taking codeine. She has no medication for anxiety. She describes intermittent anger and irritability and tearfulness.    Review of Systems     Objective:   Physical Exam she is in no acute distress.  She does exhibit frustration about her  symptoms. She is oriented x3.  She exhibits a regular rhythm with a grade 1/2 systolic murmur.  She exhibits fusiform changes of the knees with some crepitus. She is wearing support hose on her legs. She also is wearing a support lace up brace on the left ankle.  Her hands revealed no dramatic arthritic changes.            Assessment & Plan:  #1 diffuse arthralgia. She has a history of intolerance to codeine without a definitive allergy.  #2 anxiety  Plan: #1 low-dose tramadol will be prescribed as a trial  #2 low-dose lorazepam will be prescribed prn

## 2010-06-12 NOTE — Patient Instructions (Signed)
The safest pain medication to use would be tramadol. It is related to codeine but in low doses it should not affect her mental status. Additionally low-dose lorazepam can be used as needed for anxiety.  If your ankle pain persists he might consider having Dr. Lestine Box, who is a partner of Dr. Shon Baton evaluate your ankle. Initially

## 2010-06-12 NOTE — Telephone Encounter (Signed)
I called to advise pt that we were trying to locate DME form to request review by [facility]. Pt advised me that she was seen by Dr Quintella Reichert today and was Rx'd Diabetic shoes which she will forward to supplier herself. I again apologized to Rebecca [name] for the confusion and delay, she was understanding and grateful for follow up call,

## 2010-06-28 ENCOUNTER — Other Ambulatory Visit: Payer: Self-pay | Admitting: Internal Medicine

## 2010-07-13 ENCOUNTER — Other Ambulatory Visit: Payer: Self-pay | Admitting: Internal Medicine

## 2010-07-25 NOTE — Letter (Signed)
May 12, 2008    Titus Dubin. Alwyn Ren, MD,FACP,FCCP  [phone] [place]  [place] [address]   RE:  Rebecca [name], Rebecca [name]  MRN:  [id number]  /  DOB:  05-27-1932   Dear Annette Stable:   I had the pleasure of seeing Rebecca [name] in the office today in the  cardiac followup.  In general, she has been stable.  She has not had any  chest discomfort.  She has had as you know a problem with her leg and  has had continuing orthopedic followup.  She is currently in a boot.  She denies any active ongoing cardiac symptoms.   Her medications include amlodipine 5 mg daily, isosorbide mononitrate 30  mg daily, quinapril 40 mg daily, Novolin 70/30, which has been recently  adjusted by Dr. Everardo All, Lipitor 10 mg daily, vitamin E 400  international units daily, multivitamin daily, calcium 600 mg daily,  aspirin 81 mg daily, omeprazole 20 mg daily, meloxicam 15 mg daily, fish  oil daily, hydrochlorothiazide p.r.n., and vitamin C.   On physical, she is alert and oriented, in no distress.  Blood pressure  130/62, the pulse is 64.  The lung fields are clear.  The cardiac rhythm  is regular without significant murmur.  The patient last underwent  catheterization in 1999, and at that time had two-vessel percutaneous  intervention of the ramus intermedius and circumflex should not have  critical disease of the LAD and the right was fairly nondominant.   The electrocardiogram currently demonstrates normal sinus rhythm.  There  is a little bit of a delay in R-wave progression probably related to  lead placement with minor nonspecific T-flattening.   IMPRESSION:  1. Coronary artery disease, status post two-vessel percutaneous      coronary intervention.  2. Insulin-dependent diabetes mellitus.  3. Dyslipidemia, on lipid-lowering therapy.  4. Recent leg injury.   PLAN:  1. Return to clinic in 1 year.  2. Continue current medical regimen.   Bill, thanks for allowing me to share her care.     Sincerely,      Arturo Morton. Riley Kill, MD, [facility]  Electronically Signed    TDS/MedQ  DD: 05/12/2008  DT: 05/13/2008  Job #: [id number]

## 2010-07-25 NOTE — Assessment & Plan Note (Signed)
[facility]                                 ON-CALL NOTE   NAME:[name], Rebecca                   MRN:          [id number]  DATE:01/05/2007                            DOB:          1932/04/29    Dr. George Hugh patient, [phone].  Patient states that she was coughing  all night last night and wonders if we should call in an antibiotic.  She has been sick off and on for 3 days but last night she coughed all  night.  She has no fever, shortness of breath at this time and has no  chronic lung disease.  I have recommended she try Mucinex DM and be seen  in the office tomorrow.  However, if she develops fever, shortness of  breath, wheezing she can be seen in the emergency room.     Neta Mends. Panosh, MD  Electronically Signed    WKP/MedQ  DD: 01/05/2007  DT: 01/05/2007  Job #: [id number]

## 2010-07-25 NOTE — Letter (Signed)
April 25, 2007    Titus Dubin. Alwyn Ren, MD,FACP,FCCP  [phone] [place], [place] [address]   RE:  KIOSHA, BUCHAN  MRN:  [id number]  /  DOB:  1933/01/25   Dear Annette Stable,   I had the pleasure of seeing your very nice patient for Anglia  Bhargava in the office today in followup.  I have known her for many  years.  She has not been having any chest pain.  She rarely has a  slightly irregular heartbeat.  She denies  any significant symptoms.  As  you know, she last underwent radionuclide imaging in May 2004.  At that  time she had a defect at the apex.  This was consistent with an apical  defect.  Her ejection fraction was 76%.  She has been in a program of  water aerobics in the past, but had some difficulty with this.  I know  she has been seeing Dr. Everardo All for followup  of her diabetes and  thyroid disease.   MEDICATIONS:  Include amlodipine 5 mg daily, isosorbide mononitrate 30  mg daily, quinapril 40 mg daily Novolin 70/30 injection b.i.d., Lipitor  10 mg daily, vitamin E 400 international units daily, calcium 600 mg  daily, aspirin 81 mg daily and omeprazole 20 mg daily.   On physical she is alert and oriented and in good spirits.  The weight  is 202 pounds.  This is up quite a bit from a year ago, increasing from  189 to 202, blood pressure is 120/60, and the pulse is 71.  The lung  fields are clear and the cardiac rhythm is regular.   Electrocardiogram demonstrates normal sinus rhythm with left axis  deviation, anterolateral infarct of indeterminate age.   To summarize, Ms. Agena is stable.  She does have significant risk  factors including insulin-dependent diabetes mellitus and  hypercholesterolemia.  In addition, there is advanced age.  Her last  cardiac catheterization was nearly 10 years ago.  At that time, she had  well-defined disease.  She had 80% stenosis in the circumflex which was  dilated and a 90% intermediate which was dilated as well.  She  had  irregularity of the non critically blocked LAD with normal left  ventricular function.   We plan to continue her on a medical regimen.  In the absence of current  symptoms further intervention would not be warranted.  Attention to risk  factors are important and she understands this and she and I have talked  about it in detail.  We will be happy to see her back in cardiology  clinic in 1 year.  Thanks for allowing me to share in her care.    Sincerely,      Arturo Morton. Riley Kill, MD, [facility]  Electronically Signed    TDS/MedQ  DD: 04/25/2007  DT: 04/26/2007  Job #: [id number]   CC:    Gregary Signs A. Everardo All, MD

## 2010-07-25 NOTE — Consult Note (Signed)
[facility]                          ENDOCRINOLOGY CONSULTATION   NAME:[name], [name]                 MRN:          [id number]  DATE:09/04/2006                            DOB:          [birth year]    REASON FOR VISIT:  Follow up diabetes.   HISTORY OF THE PRESENT ILLNESS:  A 77 year old woman who has  successfully gone off insulin.  She states her glucoses are 70s in the  morning but they are often as high as 200 later in the day.   PAST MEDICAL HISTORY:  1. GERD.  2. Dyslipidemia.  3. Hypertension.   SOCIAL HISTORY:  The patient states the cost of her medications is her  primary consideration.   REVIEW OF SYSTEMS:  Mild hypoglycemia sometimes before breakfast.   PHYSICAL EXAMINATION:  VITAL SIGNS:  Blood pressure 126/61, heart rate  66, temperature 97.7, the weight is 92.  GENERAL:  No distress.  Does not appear anxious nor depressed.   IMPRESSION:  She has successfully gone off insulin, but she appears to  need some adjustments in her medications.   PLAN:  1. Increase Glucophage XR to 1000 mg every morning.  2. Change Amaryl to 8 mg every morning.  3. Return in about 3 months.     Sean A. Everardo All, MD  Electronically Signed    SAE/MedQ  DD: 09/04/2006  DT: 09/05/2006  Job #: [id number]   cc:   Titus Dubin. Alwyn Ren, MD,FACP,FCCP

## 2010-07-25 NOTE — Consult Note (Signed)
[facility]                          ENDOCRINOLOGY CONSULTATION   NAME:Boomershine, ALVIS PULCINI                 MRN:          [id number]  DATE:07/25/2006                            DOB:          1932-11-16    REASON FOR VISIT:  Followup diabetes and thyroid.   HISTORY OF THE PRESENT ILLNESS:  16. A 77 year old woman whose recent thyroid biopsies were benign.  2. Regarding her diabetes, she brings a record of her home glucoses,      and her glucoses range from 71 to 120 in the morning.  In the      afternoon, they range from 56 up to 132.   PAST MEDICAL HISTORY:  Same as 06/13/2006.   REVIEW OF SYSTEMS:  Denies hypoglycemia.   PHYSICAL EXAMINATION:  Blood pressure 148/69, heart rate 70, temperature  is 97.7, weight is 195.  GENERAL:  No distress.  EXTREMITIES:  No edema.   LABORATORY DATA:  I reviewed 30 home glucose readings the patient has  brought today.  Thyroid left and right lobe biopsies are benign on  07/11/2006.   IMPRESSION:  1. Multinodular goiter.  2. Diabetes is slightly over controlled.   PLAN:  1. Will recheck the multinodular goiter in a year.  2. Decrease the Amaryl to 2 mg twice a day.  3. Continue metformin at 1000 mg twice a day.  4. Return in about 3 months.     Sean A. Everardo All, MD  Electronically Signed    SAE/MedQ  DD: 07/28/2006  DT: 07/28/2006  Job #: [id number]   cc:   Titus Dubin. Alwyn Ren, MD,FACP,FCCP

## 2010-07-25 NOTE — Assessment & Plan Note (Signed)
[facility]                            CARDIOLOGY OFFICE NOTE   NAME:[name], Rebecca HON                 MRN:          [id number]  DATE:10/31/2006                            DOB:          1932/12/28    PRIMARY CARDIOLOGIST:  Arturo Morton. Riley Kill, MD, [facility].   PRIMARY CARE PHYSICIAN:  Titus Dubin. Alwyn Ren, MD,FACP,FCCP.   PATIENT PROFILE:  A 77 year old African American female with a prior  history of CAD, status post PCI and stenting of the ramus and circumflex  who presents for a routine followup.   PROBLEM LIST:  1. Coronary artery disease.      a.     Status post PCI and stenting of the ramus and left       circumflex with bare metal stents in February 1999.  2. Hyperlipidemia.  3. Diabetes mellitus.  4. Hypertension.   HISTORY OF PRESENT ILLNESS:  A 77 year old [place] American female with  the above problem list.  She presents today for routine followup.  She  reports that she has remained active walking about 30 minutes 5 days a  week without any chest pain, shortness of breath, or limitations.  She  denies any PND, orthopnea, dizziness, syncope, edema, or early satiety.   HOME MEDICATIONS:  1. Quinapril 40 mg daily.  2. Vitamin E 400 units daily.  3. Multivitamin daily.  4. Isosorbide 30 mg daily.  5. Lipitor 10 mg daily.  6. Norvasc 5 mg daily.  7. Calcium 600 mg daily.  8. Aspirin 81 mg daily.  9. Omeprazole 20 mg daily.  10.Acular 0.4% eye drops t.i.d.  11.Glimepiride 4 mg b.i.d.  12.Glucophage 500 mg b.i.d.  13.Nitroglycerin p.r.n.   PHYSICAL EXAMINATION:  VITAL SIGNS:  Blood pressure 109/62, heart rate  76, respirations 18, weight is 189 pounds, down 10 pounds since the last  visit.  GENERAL:  A pleasant African American female in no acute distress.  Awake, alert, oriented x3.  NECK:  No bruits, JVD.  LUNGS:  Respirations unlabored.  Clear to auscultation.  CARDIAC:  Regular S1 S2.  No S3, S4, murmurs.  ABDOMEN:  Round,  soft, nontender, nondistended.  Bowel sounds present  x4.  EXTREMITIES:  Warm, dry, no clubbing, cyanosis, or edema.  Dorsalis  pedis and posterior tibialis pulses 1 plus and equal bilaterally.   ACCESSORY CLINICAL FINDINGS:  EKG shows sinus rhythm with a left axis  deviation, no acute ST-T changes.   ASSESSMENT/PLAN:  1. Coronary artery disease.  [name] is doing exceptionally well      from this standpoint.  She remains on aspirin, Statin, ACE      inhibitor, and nitrate therapy.  She is encouraged to remain      active.  I refilled her sublingual nitroglycerin today.  2. Hypertension, stable.  3. Hyperlipidemia.  She remains on Lipitor therapy which she is      tolerating.  4. Diabetes.  This is followed by primary care.  She remains on oral      agents.   DISPOSITION:  The patient will follow up with Dr. Riley Kill in  approximately 6 months.  Nicolasa Ducking, ANP  Electronically Signed      Rollene Rotunda, MD, [facility]  Electronically Signed   CB/MedQ  DD: 10/31/2006  DT: 11/01/2006  Job #: [id number]

## 2010-07-28 ENCOUNTER — Other Ambulatory Visit (INDEPENDENT_AMBULATORY_CARE_PROVIDER_SITE_OTHER): Payer: Medicare Other

## 2010-07-28 ENCOUNTER — Ambulatory Visit (INDEPENDENT_AMBULATORY_CARE_PROVIDER_SITE_OTHER): Payer: Medicare Other | Admitting: Endocrinology

## 2010-07-28 ENCOUNTER — Encounter: Payer: Self-pay | Admitting: Endocrinology

## 2010-07-28 VITALS — BP 120/56 | HR 66 | Temp 97.0°F | Wt 201.0 lb

## 2010-07-28 DIAGNOSIS — E109 Type 1 diabetes mellitus without complications: Secondary | ICD-10-CM

## 2010-07-28 LAB — HEMOGLOBIN A1C: Hgb A1c MFr Bld: 8.2 % — ABNORMAL HIGH (ref 4.6–6.5)

## 2010-07-28 NOTE — Consult Note (Signed)
[facility]                          ENDOCRINOLOGY CONSULTATION   NAME:Rebecca [name], Rebecca [name]                   MRN:          [id number]  DATE:06/13/2006                            DOB:          December 02, 1932    REFERRING PHYSICIAN:  Titus Dubin. Alwyn Ren, MD,FACP,FCCP   REASON FOR REFERRAL:  Diabetes.   HISTORY OF PRESENT ILLNESS:  A 77 year old woman who reports a 39-year  history of diabetes.  She states she was placed on insulin 20 years ago  due to the death of her husband and she feels that it is possible she  does not need insulin to treat her diabetes.  Her diabetes is  complicated by CAD and by diabetes retinopathy.  She currently takes NPH  insulin 15 units in the morning and 12 units in the evening.  She  describes her diet and exercise as both good.  Symptomatically, she  has slight weight loss recently over the past few months but no  associated numbness of her feet.  Her therapy is being limited by  recurrent hypoglycemia at various times of day.   PAST MEDICAL HISTORY:  1. GERD.  2. Hypertension.  3. Dyslipidemia.   SOCIAL HISTORY:  She is retired.  She is widowed.  She states that low  co-pays are very important to her.   FAMILY HISTORY:  Positive for diabetes in her mother.   REVIEW OF SYSTEMS:  She denies chest pain and syncope.   PHYSICAL EXAMINATION:  VITAL SIGNS:  Blood pressure 158/65, heart rate  56, temperature is 97.3, the weight is 196.  GENERAL:  No distress.  SKIN:  No rash, not diaphoretic.  HEENT:  No proptosis, no periorbital swelling.  Pharynx is normal.  NECK:  She has an apparent 2-cm diameter right sided thyroid nodule.  EXTREMITIES:  Feet normal color and temperature.  There is no ulcer  present on the feet.  NEUROLOGIC:  Alert, oriented, does not appear anxious nor depressed and  sensation is intact to touch in the feet.   LABORATORY STUDIES:  On May 23, 2006, vitamin B12 640.  Hemoglobin A1c  9.2.  Urine  micro albumin is normal.   IMPRESSION:  1. Diabetes of uncertain type.  I agree with Dr. Alwyn Ren, she needs      improvement in her control.  2. Coronary artery disease.  Therefore, she would benefit from      aggressive control and at the same time avoiding hypoglycemia.  3. Incidentally noted right sided thyroid nodule.   PLAN:  1. We discussed the risk of diabetes and the importance of diet and      exercise therapy.  2. Thyroid ultrasound is ordered.  3. This patient has placed a number of conditions on her therapy.  One      is that she wants inexpensive co-pays, another is that she wants to      be tried off insulin.  I told her I will comply with these as best      I can.  She will increase the Glucophage to 1,000 mg twice a day      and  start Amaryl 4 mg twice daily.  On the day that she makes these      adjustments, she will decrease her insulin dosages in half and see      me several days later.  I am going to be out of town next week, so      I have asked her not to make these changes until I return to the      office in 11 days and then see me in followup 2-3 days after that.     Sean A. Everardo All, MD  Electronically Signed    SAE/MedQ  DD: 06/13/2006  DT: 06/13/2006  Job #: [id number]   cc:   Titus Dubin. Alwyn Ren, MD,FACP,FCCP

## 2010-07-28 NOTE — Assessment & Plan Note (Signed)
[facility]                            CARDIOLOGY OFFICE NOTE   NAME:[name], Rebecca                   MRN:          [id number]  DATE:04/12/2006                            DOB:          [birth year]    Ms. Rebecca [name] is in for followup.  She recently called the office.  She  was having palpitations and they put a monitor on her.  She has had  sinus rhythm with an occasional APC.  Importantly, the patient thinks  that this was related to the fact that she was pushing her sister around  and she was exhausted.  She now is doing quite a bit better and is not  having significant shortness of breath.   Her medications include:  1. Glucophage 500 mg p.o. b.i.d.  2. Monopril 40 mg daily.  3. Prevacid 30 mg daily.  4. Vitamin E daily.  5. Humulin N 15 units in the morning and 12 units in the evening.  6. Multivitamin daily.  7. Isosorbide 30 daily.  8. Lipitor 10 daily.  9. Norvasc 5 daily.  10.Calcium daily.  11.Aspirin 81 mg daily.   Her exam reveals a blood pressure of 130/50, pulse 68, weight 199.  Lung  fields clear to auscultation and percussion.  Cardiac rhythm is regular  with a normal first and second heart sound and no significant murmurs.   Her telemetry strips are reviewed.   Overall, the patient is improved.  I plan to see her back in followup in  6 months, but no sooner unless further problems would arise.  If she  would develop increasing problems she is to call us promptly.  She is to  remain on the same current medical regimen.     Arturo Morton. Riley Kill, MD, [facility]  Electronically Signed    TDS/MedQ  DD: 04/12/2006  DT: 04/12/2006  Job #: [id number]

## 2010-07-28 NOTE — Patient Instructions (Addendum)
blood tests are being ordered for you today.  please call [phone] to hear your test results.  You will be prompted to enter the 9-digit "MRN" number that appears at the top left of this page, followed by #.  Then you will hear the message. pending the test results, please reduce insulin to 18 units with breakfast, and 7 with the evening meal check your blood sugar 2 times a day.  vary the time of day when you check, between before the 3 meals, and at bedtime.  also check if you have symptoms of your blood sugar being too high or too low.  please keep a record of the readings and bring it to your next appointment here.  please call us sooner if you are having low blood sugar episodes. You should avoid the metformin, as it complicates our ability to see your blood sugar trends throughout the day.   Please make a follow-up appointment in 3 months (update: i left message on phone-tree:  Increase am insulin to 20 units).

## 2010-07-28 NOTE — Consult Note (Signed)
[facility]                          ENDOCRINOLOGY CONSULTATION   NAME:Rebecca [name], Rebecca [name]                 MRN:          [id number]  DATE:06/25/2006                            DOB:          05/30/1932    REASON FOR VISIT:  Follow up thyroid.   HISTORY OF PRESENT ILLNESS:  A 77 year old woman with a history of a  small multinodular goiter.  She states that it is slightly enlarged, in  her opinion.   Regular her diabetes, she states that she ran out of glucose testing  strips.   PAST MEDICAL HISTORY:  Same as on June 13, 2006.   REVIEW OF SYSTEMS:  Denies hypoglycemia.   PHYSICAL EXAMINATION:  VITAL SIGNS:  Blood pressure 145/66, heart rate  70, temperature 97.2.  The weight is 197.  GENERAL:  No distress.  NEUROLOGIC:  Alert and oriented.  Does not appear anxious or depressed.   LABORATORY STUDIES:  TSH normal.   IMPRESSION:  1. Multinodular goiter.  2. Diabetes, therapy complicated by failure to check home glucoses.   PLAN:  1. Await results of thyroid ultrasound.     Sean A. Everardo All, MD  Electronically Signed    SAE/MedQ  DD: 06/26/2006  DT: 06/27/2006  Job #: [id number]

## 2010-07-28 NOTE — Consult Note (Signed)
[facility]                          ENDOCRINOLOGY CONSULTATION   NAME:Rebecca [name], Rebecca [name]                 MRN:          [id number]  DATE:06/25/2006                            DOB:          Jan 07, 1933    REASON FOR VISIT:  Follow up diabetes.   HISTORY OF PRESENT ILLNESS:  This is a 77 year old woman who ran out of  her glucose testing strips.   Regarding the enlargement of her thyroid, she states that she can just  barely notice this.   PAST MEDICAL HISTORY:  The same as June 13, 2006.   REVIEW OF SYSTEMS:  Denies hypoglycemia.   PHYSICAL EXAMINATION:  VITAL SIGNS:  Blood pressure 146/66, heart rate  70, temperature 97.2, weight 197.  GENERAL:  No distress.  She does not appear anxious or depressed.   LABORATORY STUDIES:  1. Thyroid ultrasound shows a small multinodular goiter.  2. TSH is normal.   IMPRESSION:  1. Therapy of diabetes limited by lack of home glucose monitoring.  2. Small multinodular goiter.   PLAN:  1. I gave her a new prescription for strips, and she will check a      glucose daily, varying the time of day among a.c. and h.s.  2. Return next week when I will see if I can palpate the largest      nodule in the thyroid well enough to biopsy it in the office or if      she will need to have this done by ultrasound.     Sean A. Everardo All, MD  Electronically Signed    SAE/MedQ  DD: 06/26/2006  DT: 06/27/2006  Job #: [id number]   cc:   Titus Dubin. Alwyn Ren, MD,FACP,FCCP

## 2010-07-28 NOTE — Progress Notes (Signed)
Subjective:    Patient ID: Rebecca [name], female    DOB: 1932/08/02, 77 y.o.   MRN: [id number]  HPI Pt says she is intermittently taking metformin from the supply of a deceased relative.  She takes inulin 20 units am and 7 units pm.  she brings a record of her cbg's which i have reviewed today.  It varies from 70-200, with no pattern throughout the day, but most are in the low-100's. No past medical history on file.  Past Surgical History  Procedure Date  . Coronary angioplasty   . Biopsy thyroid     Dr.Ahsley Attwood-Neg   . Retinal laser procedure   . Foot surgery 2004  . Back surgery 1990    History   Social History  . Marital Status: Widowed    Spouse Name: N/A    Number of Children: N/A  . Years of Education: N/A   Occupational History  . Not on file.   Social History Main Topics  . Smoking status: Never Smoker   . Smokeless tobacco: Not on file  . Alcohol Use: No  . Drug Use: No  . Sexually Active: Not on file   Other Topics Concern  . Not on file   Social History Narrative  . No narrative on file    Current Outpatient Prescriptions on File Prior to Visit  Medication Sig Dispense Refill  . amLODipine (NORVASC) 10 MG tablet Take 10 mg by mouth daily.        . Ascorbic Acid (VITAMIN C) 500 MG tablet Take 500 mg by mouth 2 (two) times daily.        Marland Kitchen aspirin 81 MG tablet Take 81 mg by mouth daily.        Marland Kitchen atorvastatin (LIPITOR) 10 MG tablet Take 10 mg by mouth daily.        . calcium carbonate (OS-CAL) 600 MG TABS Take 600 mg by mouth 2 (two) times daily with a meal.        . Cholecalciferol (VITAMIN D3) 5000 UNITS CAPS Take 5,000 Units by mouth daily.        Marland Kitchen Cod Liver Oil 1000 MG CAPS Take 1,000 mg by mouth daily. During the winter       . hydrochlorothiazide (,MICROZIDE/HYDRODIURIL,) 12.5 MG capsule Take 12.5 mg by mouth daily as needed.        . isosorbide mononitrate (IMDUR) 30 MG 24 hr tablet TAKE ONE TABLET BY MOUTH EVERY DAY  30 tablet  5  .  LORazepam (ATIVAN) 0.5 MG tablet Take 1 tablet (0.5 mg total) by mouth every 8 (eight) hours as needed for anxiety.  30 tablet  2  . Multiple Vitamin (MULTIVITAMIN) tablet Take 1 tablet by mouth daily.        . nitroGLYCERIN (NITROSTAT) 0.4 MG SL tablet Place 0.4 mg under the tongue every 5 (five) minutes as needed.        Marland Kitchen omeprazole (PRILOSEC) 20 MG capsule TAKE ONE CAPSULE BY MOUTH IN THE MORNING  30 capsule  6  . quinapril (ACCUPRIL) 40 MG tablet Take 40 mg by mouth at bedtime.        . traMADol (ULTRAM) 50 MG tablet Take 1 tablet (50 mg total) by mouth every 6 (six) hours as needed for pain.  30 tablet  2  . vitamin E (VITAMIN E) 400 UNIT capsule Take 400 Units by mouth daily.        Marland Kitchen DISCONTD: insulin NPH-insulin regular (NOVOLIN 70/30) (70-30) 100  UNIT/ML injection Use 20 units every morning and 7 units every evening  10 mL  3    Allergies  Allergen Reactions  . Codeine     Family History  Problem Relation Age of Onset  . Hypertension    . Lung cancer    . Heart disease    . Gout Sister   . Gout Brother   . Heart attack Father   . Heart attack Mother     BP 120/56  Pulse 66  Temp(Src) 97 F (36.1 C) (Oral)  Wt 201 lb (91.173 kg)  SpO2 98%    Review of Systems Denies loc.    Objective:   Physical Exam GENERAL: no distress SKIN: Insulin injection sites at the anterior abdomen are normal     Lab Results  Component Value Date   HGBA1C 8.2* 07/28/2010     Assessment & Plan:  Dm, therapy limited by noncompliance.  i'll do the best i can.

## 2010-07-28 NOTE — Assessment & Plan Note (Signed)
[facility]                        [facility] OFFICE NOTE   NAME:[name], Rebecca                   MRN:          [id number]  DATE:05/23/2006                            DOB:          [birth year]    REASON FOR VISIT:  Followup of diabetes.   In July 2007, her hemoglobin A1c was 6.1; on February 29 it was 9.2. She  stated that she had reduced her white carbohydrates following receiving  the report; she was somewhat vague as to what her diet had been. She  made the statement I was not eating like a hog. Despite this dramatic  loss of control her diabetes, she states that she is having intermittent  hypoglycemic episodes. She is on Humulin N 15 units each morning and 12  units each evening.   She denies polyuria, polyphagia or polydipsia.   OTHER MEDICATIONS:  1. Omeprazole 20 mg each morning.  2. Eyedrops from Dr. Chales Abrahams.  3. Metformin ER 500 mg twice a day.  4. Isosorbide 30 mg daily.  5. Quinapril 40 mg daily.  6. Lipitor 10 mg daily.  7. Amlodipine 5 mg daily.  8. Calcium with vitamin D.   ALLERGIES:  CODEINE.   She has never smoked and does not drink.   PAST MEDICAL HISTORY:  Appendectomy, total abdominal hysterectomy for  fibroids and oophorectomy for cysts. That surgery was complicated by  infection postop. She had stenting in the 1990s. By her report, the  colonoscopy has been negative. She has had cataract surgery most  recently in 2007.   OTHER MEDICAL PROBLEMS:  Fibrocystic breast disease.   Weight was stable at 199, pulse was 68 and regular, respiratory rate 18.  Blood pressure was 124/68. Although she complained of some vague  ophthalmologic symptoms which she relates to the prior surgery, the exam  revealed normal extraocular motion and no evidence of conjunctivitis.  Vision was normal to confrontation.   LABORATORY DATA:  Her lab studies revealed a hematocrit of 34.9; lipids  were at goal. Repeat hematocrit was 35.8  which is stable to improved.  Iron, folate and B12 levels were normal. Stool cards are pending.   I have recommended an endocrinology consult in view of  her history of  hypoglycemia despite poorly controlled diabetes.     Titus Dubin. Alwyn Ren, MD,FACP,FCCP  Electronically Signed    [facility]  DD: 05/31/2006  DT: 05/31/2006  Job #: [id number]

## 2010-08-17 ENCOUNTER — Other Ambulatory Visit: Payer: Self-pay | Admitting: Internal Medicine

## 2010-08-30 ENCOUNTER — Encounter: Payer: Self-pay | Admitting: Cardiology

## 2010-08-31 ENCOUNTER — Encounter: Payer: Self-pay | Admitting: Cardiology

## 2010-08-31 ENCOUNTER — Ambulatory Visit (INDEPENDENT_AMBULATORY_CARE_PROVIDER_SITE_OTHER): Payer: Medicare Other | Admitting: Cardiology

## 2010-08-31 VITALS — BP 120/59 | HR 69 | Resp 18 | Ht 63.0 in | Wt 198.0 lb

## 2010-08-31 DIAGNOSIS — I10 Essential (primary) hypertension: Secondary | ICD-10-CM

## 2010-08-31 DIAGNOSIS — E7800 Pure hypercholesterolemia, unspecified: Secondary | ICD-10-CM

## 2010-08-31 DIAGNOSIS — I2510 Atherosclerotic heart disease of native coronary artery without angina pectoris: Secondary | ICD-10-CM

## 2010-08-31 NOTE — Patient Instructions (Signed)
Your physician wants you to follow-up in: 1 year with Dr. Stuckey. You will receive a reminder letter in the mail two months in advance. If you don't receive a letter, please call our office to schedule the follow-up appointment.  

## 2010-09-11 NOTE — Assessment & Plan Note (Signed)
BP remains under good control.

## 2010-09-11 NOTE — Assessment & Plan Note (Addendum)
Has no new symptoms.  Tolerates medications without difficulty. Had prior intervention of the ramus and the CFX vessel remotely.  No new symptoms.  Does have IDDM. Had borderline aortic root enlargement on prior echo--measured at 37mm by Dr. Shirlee Latch.  Perhaps repeat this in a few years.

## 2010-09-11 NOTE — Progress Notes (Signed)
HPI:  She really is doing quite well without specific complaints.  No new symptoms. She did have a discussion with Dr. Alwyn Ren about her DM management.    Current Outpatient Prescriptions  Medication Sig Dispense Refill  . amLODipine (NORVASC) 10 MG tablet Take 10 mg by mouth daily.        . Ascorbic Acid (VITAMIN C) 500 MG tablet Take 500 mg by mouth 2 (two) times daily.        Marland Kitchen aspirin 81 MG tablet Take 81 mg by mouth daily.        . calcium carbonate (OS-CAL) 600 MG TABS Take 600 mg by mouth 2 (two) times daily with a meal.        . Cholecalciferol (VITAMIN D3) 5000 UNITS CAPS Take 5,000 Units by mouth daily.        Marland Kitchen Cod Liver Oil 1000 MG CAPS Take 1,000 mg by mouth daily. During the winter      . hydrochlorothiazide (,MICROZIDE/HYDRODIURIL,) 12.5 MG capsule Take 12.5 mg by mouth daily as needed.        . insulin NPH-insulin regular (NOVOLIN 70/30) (70-30) 100 UNIT/ML injection Take 20 units with breakfast, and 5 units with the evening meal.      . isosorbide mononitrate (IMDUR) 30 MG 24 hr tablet TAKE ONE TABLET BY MOUTH EVERY DAY  30 tablet  5  . LIPITOR 10 MG tablet TAKE ONE TABLET BY MOUTH EVERY DAY  30 each  2  . Multiple Vitamin (MULTIVITAMIN) tablet Take 1 tablet by mouth daily.        . nitroGLYCERIN (NITROSTAT) 0.4 MG SL tablet Place 0.4 mg under the tongue every 5 (five) minutes as needed.        Marland Kitchen omeprazole (PRILOSEC) 20 MG capsule TAKE ONE CAPSULE BY MOUTH IN THE MORNING  30 capsule  6  . quinapril (ACCUPRIL) 40 MG tablet Take 40 mg by mouth at bedtime.        . traMADol (ULTRAM) 50 MG tablet Take 1 tablet (50 mg total) by mouth every 6 (six) hours as needed for pain.  30 tablet  2  . vitamin E (VITAMIN E) 400 UNIT capsule Take 400 Units by mouth daily.        Marland Kitchen DISCONTD: insulin NPH-insulin regular (NOVOLIN 70/30) (70-30) 100 UNIT/ML injection Use 20 units every morning and 7 units every evening  10 mL  3    Allergies  Allergen Reactions  . Codeine     Past Medical  History  Diagnosis Date  . CAD (coronary artery disease)   . Hypercholesteremia   . Hypertension   . Sickle-cell trait   . Anemia, unspecified   . Ankle pain, left   . Esophageal reflux   . Multinodular goiter   . Hiatal hernia   . IDDM (insulin dependent diabetes mellitus)     Type II  . Fibrocystic breast disease     Past Surgical History  Procedure Date  . Coronary angioplasty   . Biopsy thyroid     Dr.Ellison-Neg   . Retinal laser procedure   . Foot surgery 2004  . Back surgery 1990  . "female" 64    Family History  Problem Relation Age of Onset  . Hypertension    . Lung cancer    . Heart disease    . Gout Sister   . Gout Brother   . Heart attack Father   . Heart attack Mother   . Gout Brother  History   Social History  . Marital Status: Widowed    Spouse Name: N/A    Number of Children: N/A  . Years of Education: N/A   Occupational History  . Not on file.   Social History Main Topics  . Smoking status: Never Smoker   . Smokeless tobacco: Not on file  . Alcohol Use: No  . Drug Use: No  . Sexually Active: Not on file   Other Topics Concern  . Not on file   Social History Narrative   WidowedGets regular exercise    ROS: Please see the HPI.  All other systems reviewed and negative.  PHYSICAL EXAM:  BP 120/59  Pulse 69  Resp 18  Ht 5\' 3"  (1.6 m)  Wt 198 lb (89.812 kg)  BMI 35.07 kg/m2  General: Well developed, well nourished, in no acute distress. Head:  Normocephalic and atraumatic. Neck: no JVD Lungs: Clear to auscultation and percussion. Heart: Normal S1 and S2.  No murmur, rubs or gallops.  Pulses: Pulses normal in all 4 extremities. Extremities: No clubbing or cyanosis. No edema. Neurologic: Alert and oriented x 3.  EKG:    ASSESSMENT AND PLAN:

## 2010-09-11 NOTE — Assessment & Plan Note (Signed)
Managed by Dr. Alwyn Ren.  On low dose statin at this point in time.

## 2010-10-20 ENCOUNTER — Other Ambulatory Visit (INDEPENDENT_AMBULATORY_CARE_PROVIDER_SITE_OTHER): Payer: Medicare Other

## 2010-10-20 ENCOUNTER — Ambulatory Visit (INDEPENDENT_AMBULATORY_CARE_PROVIDER_SITE_OTHER)
Admission: RE | Admit: 2010-10-20 | Discharge: 2010-10-20 | Disposition: A | Discharge status: Home / Self Care | Payer: Medicare Other | Source: Ambulatory Visit | Attending: Endocrinology | Admitting: Endocrinology

## 2010-10-20 ENCOUNTER — Ambulatory Visit (INDEPENDENT_AMBULATORY_CARE_PROVIDER_SITE_OTHER): Payer: Medicare Other | Admitting: Endocrinology

## 2010-10-20 ENCOUNTER — Encounter: Payer: Self-pay | Admitting: Endocrinology

## 2010-10-20 VITALS — BP 124/62 | HR 65 | Temp 97.9°F | Ht 64.0 in | Wt 201.2 lb

## 2010-10-20 DIAGNOSIS — M25579 Pain in unspecified ankle and joints of unspecified foot: Secondary | ICD-10-CM

## 2010-10-20 DIAGNOSIS — M25572 Pain in left ankle and joints of left foot: Secondary | ICD-10-CM | POA: Insufficient documentation

## 2010-10-20 DIAGNOSIS — E109 Type 1 diabetes mellitus without complications: Secondary | ICD-10-CM

## 2010-10-20 LAB — HEMOGLOBIN A1C: Hgb A1c MFr Bld: 7.9 % — ABNORMAL HIGH (ref 4.6–6.5)

## 2010-10-20 MED ORDER — INSULIN NPH ISOPHANE & REGULAR (70-30) 100 UNIT/ML ~~LOC~~ SUSP: SUBCUTANEOUS | Status: DC

## 2010-10-20 NOTE — Progress Notes (Signed)
Subjective:    Patient ID: Rebecca [name], female    DOB: 1932/10/01, 77 y.o.   MRN: [id number]  HPI she brings a record of her cbg's which i have reviewed today. It varies from 70-200, but most are in the low-100's.  It is slightly lower in am, but there is overall not a big trend.   Pt states few years of pain at the left ankle, but no assoc numbness.  Past Medical History  Diagnosis Date  . CAD (coronary artery disease)   . Hypercholesteremia   . Hypertension   . Sickle-cell trait   . Anemia, unspecified   . Ankle pain, left   . Esophageal reflux   . Multinodular goiter   . Hiatal hernia   . IDDM (insulin dependent diabetes mellitus)     Type II  . Fibrocystic breast disease     Past Surgical History  Procedure Date  . Coronary angioplasty   . Biopsy thyroid     Dr.Amenah Tucci-Neg   . Retinal laser procedure   . Foot surgery 2004  . Back surgery 1990  . "female" 63    History   Social History  . Marital Status: Widowed    Spouse Name: N/A    Number of Children: N/A  . Years of Education: N/A   Occupational History  . Not on file.   Social History Main Topics  . Smoking status: Never Smoker   . Smokeless tobacco: Not on file  . Alcohol Use: No  . Drug Use: No  . Sexually Active: Not on file   Other Topics Concern  . Not on file   Social History Narrative   WidowedGets regular exercise    Current Outpatient Prescriptions on File Prior to Visit  Medication Sig Dispense Refill  . amLODipine (NORVASC) 10 MG tablet Take 10 mg by mouth daily.        . Ascorbic Acid (VITAMIN C) 500 MG tablet Take 500 mg by mouth 2 (two) times daily.        Marland Kitchen aspirin 81 MG tablet Take 81 mg by mouth daily.        . calcium carbonate (OS-CAL) 600 MG TABS Take 600 mg by mouth 2 (two) times daily with a meal.        . Cholecalciferol (VITAMIN D3) 5000 UNITS CAPS Take 5,000 Units by mouth daily.        Marland Kitchen Cod Liver Oil 1000 MG CAPS Take 1,000 mg by mouth daily. During the  winter      . hydrochlorothiazide (,MICROZIDE/HYDRODIURIL,) 12.5 MG capsule Take 12.5 mg by mouth daily as needed.        . insulin NPH-insulin regular (NOVOLIN 70/30) (70-30) 100 UNIT/ML injection Take 20 units with breakfast, and 5 units with the evening meal.      . isosorbide mononitrate (IMDUR) 30 MG 24 hr tablet TAKE ONE TABLET BY MOUTH EVERY DAY  30 tablet  5  . LIPITOR 10 MG tablet TAKE ONE TABLET BY MOUTH EVERY DAY  30 each  2  . Multiple Vitamin (MULTIVITAMIN) tablet Take 1 tablet by mouth daily.        . nitroGLYCERIN (NITROSTAT) 0.4 MG SL tablet Place 0.4 mg under the tongue every 5 (five) minutes as needed.        Marland Kitchen omeprazole (PRILOSEC) 20 MG capsule TAKE ONE CAPSULE BY MOUTH IN THE MORNING  30 capsule  6  . quinapril (ACCUPRIL) 40 MG tablet Take 40 mg by mouth at  bedtime.        . traMADol (ULTRAM) 50 MG tablet Take 1 tablet (50 mg total) by mouth every 6 (six) hours as needed for pain.  30 tablet  2  . vitamin E (VITAMIN E) 400 UNIT capsule Take 400 Units by mouth daily.          Allergies  Allergen Reactions  . Codeine     Family History  Problem Relation Age of Onset  . Hypertension    . Lung cancer    . Heart disease    . Gout Sister   . Gout Brother   . Heart attack Father   . Heart attack Mother   . Gout Brother     BP 124/62  Pulse 65  Temp(Src) 97.9 F (36.6 C) (Oral)  Ht 5\' 4"  (1.626 m)  Wt 201 lb 3.2 oz (91.264 kg)  BMI 34.54 kg/m2  SpO2 96%    Review of Systems Denies loc and falls.      Objective:   Physical Exam Pulses: dorsalis pedis intact bilat.   Feet: the arches are flat.  no ulcer on the feet.  feet are of normal color and temp.  no edema Neuro: sensation is intact to touch on the feet   Lab Results  Component Value Date   HGBA1C 7.9* 10/20/2010  (also, x-ray result is noted)    Assessment & Plan:  Dm.  this is the best control this pt should aim for, given this regimen, which does match insulin to her changing needs throughout  the day. Ankle pain.  She is at risk for charcot joint

## 2010-10-20 NOTE — Patient Instructions (Addendum)
A diabetes blood test, and an x-ray are being ordered for you today.  please call [phone] to hear your test results.  You will be prompted to enter the 9-digit "MRN" number that appears at the top left of this page, followed by #.  Then you will hear the message. pending the test results, please reduce insulin to 20 units with breakfast, and 5 with the evening meal check your blood sugar 2 times a day.  vary the time of day when you check, between before the 3 meals, and at bedtime.  also check if you have symptoms of your blood sugar being too high or too low.  please keep a record of the readings and bring it to your next appointment here.  please call us sooner if you are having low blood sugar episodes.  Please make a follow-up appointment in 3 months good diet and exercise habits significanly improve the control of your diabetes.  please let me know if you wish to be referred to a dietician.  high blood sugar is very risky to your health.  you should see an eye doctor every year. controlling your blood pressure and cholesterol drastically reduces the damage diabetes does to your body.  this also applies to quitting smoking.  please discuss these with your doctor.  you should take an aspirin every day, unless you have been advised by a doctor not to. (update: i left message on phone-tree:  rx as we discussed.  Call if you want to see ortho)

## 2010-10-28 ENCOUNTER — Other Ambulatory Visit: Payer: Self-pay | Admitting: Internal Medicine

## 2010-12-22 ENCOUNTER — Other Ambulatory Visit: Payer: Self-pay | Admitting: Internal Medicine

## 2010-12-28 ENCOUNTER — Other Ambulatory Visit: Payer: Self-pay | Admitting: Internal Medicine

## 2011-01-12 ENCOUNTER — Other Ambulatory Visit: Payer: Self-pay | Admitting: Internal Medicine

## 2011-01-16 NOTE — Telephone Encounter (Signed)
LIPID/HEP [id number]  

## 2011-01-22 ENCOUNTER — Encounter: Payer: Self-pay | Admitting: Endocrinology

## 2011-01-22 ENCOUNTER — Other Ambulatory Visit (INDEPENDENT_AMBULATORY_CARE_PROVIDER_SITE_OTHER): Payer: Medicare Other

## 2011-01-22 ENCOUNTER — Ambulatory Visit (INDEPENDENT_AMBULATORY_CARE_PROVIDER_SITE_OTHER): Payer: Medicare Other | Admitting: Endocrinology

## 2011-01-22 VITALS — BP 128/58 | HR 63 | Temp 98.8°F | Ht 64.0 in | Wt 196.0 lb

## 2011-01-22 DIAGNOSIS — Z23 Encounter for immunization: Secondary | ICD-10-CM

## 2011-01-22 DIAGNOSIS — E109 Type 1 diabetes mellitus without complications: Secondary | ICD-10-CM

## 2011-01-22 NOTE — Progress Notes (Signed)
Subjective:    Patient ID: Rebecca [name], female    DOB: [birth year], 77 y.o.   MRN: [id number]  HPI Patient returns for follow-up of insulin-requiring DM (1969).  pt states she feels well in general.  She has chosen bid insulin.  she brings a record of her cbg's which i have reviewed today.  It varies from 75-130.  There is no trend throughout the day, except it is lower with exertion.   Past Medical History  Diagnosis Date  . CAD (coronary artery disease)   . Hypercholesteremia   . Hypertension   . Sickle-cell trait   . Anemia, unspecified   . Ankle pain, left   . Esophageal reflux   . Multinodular goiter   . Hiatal hernia   . IDDM (insulin dependent diabetes mellitus)     Type II  . Fibrocystic breast disease     Past Surgical History  Procedure Date  . Coronary angioplasty   . Biopsy thyroid     Dr.Matasha Smigelski-Neg   . Retinal laser procedure   . Foot surgery 2004  . Back surgery 1990  . "female" 39    History   Social History  . Marital Status: Widowed    Spouse Name: N/A    Number of Children: N/A  . Years of Education: N/A   Occupational History  . Not on file.   Social History Main Topics  . Smoking status: Never Smoker   . Smokeless tobacco: Not on file  . Alcohol Use: No  . Drug Use: No  . Sexually Active: Not on file   Other Topics Concern  . Not on file   Social History Narrative   WidowedGets regular exercise    Current Outpatient Prescriptions on File Prior to Visit  Medication Sig Dispense Refill  . amLODipine (NORVASC) 10 MG tablet TAKE ONE TABLET BY MOUTH EVERY DAY  30 tablet  5  . Ascorbic Acid (VITAMIN C) 500 MG tablet Take 500 mg by mouth 2 (two) times daily.        Marland Kitchen aspirin 81 MG tablet Take 81 mg by mouth daily.        Marland Kitchen atorvastatin (LIPITOR) 10 MG tablet TAKE ONE TABLET BY MOUTH EVERY DAY  30 tablet  0  . calcium carbonate (OS-CAL) 600 MG TABS Take 600 mg by mouth 2 (two) times daily with a meal.        . Cholecalciferol  (VITAMIN D3) 5000 UNITS CAPS Take 5,000 Units by mouth daily.        Marland Kitchen Cod Liver Oil 1000 MG CAPS Take 1,000 mg by mouth daily. During the winter      . hydrochlorothiazide (,MICROZIDE/HYDRODIURIL,) 12.5 MG capsule Take 12.5 mg by mouth daily as needed.        . isosorbide mononitrate (IMDUR) 30 MG 24 hr tablet TAKE ONE TABLET BY MOUTH EVERY DAY  30 tablet  3  . LORazepam (ATIVAN) 0.5 MG tablet TAKE ONE TABLET BY MOUTH EVERY 8 HOURS AS NEEDED FOR ANXIETY  30 tablet  1  . Multiple Vitamin (MULTIVITAMIN) tablet Take 1 tablet by mouth daily.        . nitroGLYCERIN (NITROSTAT) 0.4 MG SL tablet Place 0.4 mg under the tongue every 5 (five) minutes as needed.        Marland Kitchen omeprazole (PRILOSEC) 20 MG capsule TAKE ONE CAPSULE BY MOUTH IN THE MORNING  30 capsule  6  . quinapril (ACCUPRIL) 40 MG tablet TAKE ONE TABLET BY MOUTH EVERY  DAY  90 tablet  1  . traMADol (ULTRAM) 50 MG tablet Take 1 tablet (50 mg total) by mouth every 6 (six) hours as needed for pain.  30 tablet  2  . vitamin E (VITAMIN E) 400 UNIT capsule Take 400 Units by mouth daily.          Allergies  Allergen Reactions  . Codeine     Family History  Problem Relation Age of Onset  . Hypertension    . Lung cancer    . Heart disease    . Gout Sister   . Gout Brother   . Heart attack Father   . Heart attack Mother   . Gout Brother     BP 128/58  Pulse 63  Temp(Src) 98.8 F (37.1 C) (Oral)  Ht 5\' 4"  (1.626 m)  Wt 196 lb (88.905 kg)  BMI 33.64 kg/m2  SpO2 96%  Review of Systems denies hypoglycemia    Objective:   Physical Exam VITAL SIGNS:  See vs page GENERAL: no distress SKIN:  Insulin injection sites at the anterior abdomen are normal  Lab Results  Component Value Date   HGBA1C 8.5* 01/22/2011      Assessment & Plan:  Dm, needs increased rx

## 2011-01-22 NOTE — Patient Instructions (Addendum)
A diabetes blood test is being ordered for you today.  please call [phone] to hear your test results.  You will be prompted to enter the 9-digit "MRN" number that appears at the top left of this page, followed by #.  Then you will hear the message. pending the test results, please reduce insulin to 20 units with breakfast, and 5 with the evening meal check your blood sugar 2 times a day.  vary the time of day when you check, between before the 3 meals, and at bedtime.  also check if you have symptoms of your blood sugar being too high or too low.  please keep a record of the readings and bring it to your next appointment here.  please call us sooner if you are having low blood sugar episodes.  Please make a follow-up appointment in 3 months. (update: i left message on phone-tree:  Increase insulin to 20 am and 10 pm.  If exertion is anticipated, take just 10 am)

## 2011-01-23 LAB — HEMOGLOBIN A1C: Hgb A1c MFr Bld: 8.5 % — ABNORMAL HIGH (ref 4.6–6.5)

## 2011-02-14 ENCOUNTER — Other Ambulatory Visit: Payer: Self-pay | Admitting: Internal Medicine

## 2011-03-12 ENCOUNTER — Other Ambulatory Visit: Payer: Self-pay | Admitting: Internal Medicine

## 2011-03-14 MED ORDER — ATORVASTATIN CALCIUM 10 MG PO TABS: ORAL_TABLET | ORAL | Status: DC

## 2011-03-14 MED ORDER — HYDROCHLOROTHIAZIDE 12.5 MG PO CAPS: 12.5000 mg | ORAL_CAPSULE | Freq: Every day | ORAL | Status: DC | PRN

## 2011-03-14 MED ORDER — OMEPRAZOLE 20 MG PO CPDR: DELAYED_RELEASE_CAPSULE | ORAL | Status: DC

## 2011-03-14 MED ORDER — QUINAPRIL HCL 40 MG PO TABS: ORAL_TABLET | ORAL | Status: DC

## 2011-03-14 MED ORDER — AMLODIPINE BESYLATE 10 MG PO TABS: ORAL_TABLET | ORAL | Status: DC

## 2011-03-14 MED ORDER — ISOSORBIDE MONONITRATE ER 30 MG PO TB24: ORAL_TABLET | ORAL | Status: DC

## 2011-03-14 NOTE — Telephone Encounter (Signed)
RX's sent, patient needs to schedule a CPX in 06/2011. Patient should schedule Lipids when convenient 272.4

## 2011-04-17 ENCOUNTER — Other Ambulatory Visit: Payer: Self-pay | Admitting: Cardiology

## 2011-04-23 ENCOUNTER — Other Ambulatory Visit: Payer: Medicare Other

## 2011-04-23 ENCOUNTER — Ambulatory Visit: Payer: Medicare Other

## 2011-04-23 ENCOUNTER — Encounter: Payer: Self-pay | Admitting: Endocrinology

## 2011-04-23 ENCOUNTER — Ambulatory Visit (INDEPENDENT_AMBULATORY_CARE_PROVIDER_SITE_OTHER): Payer: Medicare Other | Admitting: Endocrinology

## 2011-04-23 VITALS — BP 132/62 | HR 68 | Temp 97.8°F | Ht 64.0 in | Wt 202.0 lb

## 2011-04-23 DIAGNOSIS — E1065 Type 1 diabetes mellitus with hyperglycemia: Secondary | ICD-10-CM

## 2011-04-23 DIAGNOSIS — E1142 Type 2 diabetes mellitus with diabetic polyneuropathy: Secondary | ICD-10-CM

## 2011-04-23 DIAGNOSIS — E1049 Type 1 diabetes mellitus with other diabetic neurological complication: Secondary | ICD-10-CM

## 2011-04-23 DIAGNOSIS — E1149 Type 2 diabetes mellitus with other diabetic neurological complication: Secondary | ICD-10-CM

## 2011-04-23 NOTE — Patient Instructions (Addendum)
A diabetes blood test is being ordered for you today.  please call [phone] to hear your test results.  You will be prompted to enter the 9-digit "MRN" number that appears at the top left of this page, followed by #.  Then you will hear the message. pending the test results, please continue insulin to 20 units with breakfast, and 5 with the evening meal check your blood sugar 2 times a day.  vary the time of day when you check, between before the 3 meals, and at bedtime.  also check if you have symptoms of your blood sugar being too high or too low.  please keep a record of the readings and bring it to your next appointment here.  please call us sooner if you are having low blood sugar episodes.  Please make a follow-up appointment in 3 months.  i'll do the form for your diabetes shoes.  Please ask that it be sent here.

## 2011-04-23 NOTE — Progress Notes (Signed)
Subjective:    Patient ID: Rebecca [name], female    DOB: [birth year], 78 y.o.   MRN: [id number]  HPI Patient returns for follow-up of insulin-requiring DM (1969).  pt states she feels well in general.  She has chosen bid insulin.  she brings a record of her cbg's which i have reviewed today.  It varies from 77-153.  There is no trend throughout the day, except it is somewhat lower with exertion.  She did not increase insulin. Pt states few years of slight pain at the left ankle, but no assoc numbness. Past Medical History  Diagnosis Date  . CAD (coronary artery disease)   . Hypercholesteremia   . Hypertension   . Sickle-cell trait   . Anemia, unspecified   . Ankle pain, left   . Esophageal reflux   . Multinodular goiter   . Hiatal hernia   . IDDM (insulin dependent diabetes mellitus)     Type II  . Fibrocystic breast disease     Past Surgical History  Procedure Date  . Coronary angioplasty   . Biopsy thyroid     Dr.Georg Ang-Neg   . Retinal laser procedure   . Foot surgery 2004  . Back surgery 1990  . "female" 53    History   Social History  . Marital Status: Widowed    Spouse Name: N/A    Number of Children: N/A  . Years of Education: N/A   Occupational History  . Not on file.   Social History Main Topics  . Smoking status: Never Smoker   . Smokeless tobacco: Not on file  . Alcohol Use: No  . Drug Use: No  . Sexually Active: Not on file   Other Topics Concern  . Not on file   Social History Narrative   WidowedGets regular exercise    Current Outpatient Prescriptions on File Prior to Visit  Medication Sig Dispense Refill  . amLODipine (NORVASC) 10 MG tablet TAKE ONE TABLET BY MOUTH EVERY DAY  90 tablet  1  . Ascorbic Acid (VITAMIN C) 500 MG tablet Take 500 mg by mouth 2 (two) times daily.        Marland Kitchen aspirin 81 MG tablet Take 81 mg by mouth daily.        . calcium carbonate (OS-CAL) 600 MG TABS Take 600 mg by mouth 2 (two) times daily with a meal.         . Cholecalciferol (VITAMIN D3) 5000 UNITS CAPS Take 5,000 Units by mouth daily.        Marland Kitchen Cod Liver Oil 1000 MG CAPS Take 1,000 mg by mouth daily. During the winter      . hydrochlorothiazide (MICROZIDE) 12.5 MG capsule Take 1 capsule (12.5 mg total) by mouth daily as needed.  90 capsule  1  . insulin NPH-insulin regular (NOVOLIN 70/30) (70-30) 100 UNIT/ML injection Take 20 units with breakfast, and 10 units with the evening meal.       . isosorbide mononitrate (IMDUR) 30 MG 24 hr tablet TAKE ONE TABLET BY MOUTH EVERY DAY  90 tablet  1  . LORazepam (ATIVAN) 0.5 MG tablet TAKE ONE TABLET BY MOUTH EVERY 8 HOURS AS NEEDED FOR ANXIETY  30 tablet  1  . Multiple Vitamin (MULTIVITAMIN) tablet Take 1 tablet by mouth daily.        Marland Kitchen NITROSTAT 0.4 MG SL tablet DISSOLVE ONE TABLET UNDER TONGUE AS DIRECTED FOR CHEST PAIN  25 each  3  . omeprazole (PRILOSEC) 20 MG  capsule TAKE ONE CAPSULE BY MOUTH EVERY DAY IN THE MORNING  90 capsule  1  . quinapril (ACCUPRIL) 40 MG tablet TAKE ONE TABLET BY MOUTH EVERY DAY  90 tablet  1  . traMADol (ULTRAM) 50 MG tablet Take 1 tablet (50 mg total) by mouth every 6 (six) hours as needed for pain.  30 tablet  2  . vitamin E (VITAMIN E) 400 UNIT capsule Take 400 Units by mouth daily.        Marland Kitchen atorvastatin (LIPITOR) 10 MG tablet TAKE ONE TABLET BY MOUTH EVERY DAY  90 tablet  0    Allergies  Allergen Reactions  . Codeine     Family History  Problem Relation Age of Onset  . Hypertension    . Lung cancer    . Heart disease    . Gout Sister   . Gout Brother   . Heart attack Father   . Heart attack Mother   . Gout Brother     BP 132/62  Pulse 68  Temp(Src) 97.8 F (36.6 C) (Oral)  Ht 5\' 4"  (1.626 m)  Wt 202 lb (91.627 kg)  BMI 34.67 kg/m2  SpO2 96%     Review of Systems Denies loc and weight change    Objective:   Physical Exam VITAL SIGNS:  See vs page GENERAL: no distress Pulses: dorsalis pedis intact bilat.   Feet: no deformity.  no ulcer on the  feet.  feet are of normal color and temp.  no edema.   Neuro: sensation is intact to touch on the feet.    (i reviewed 10/20/10 x-ray)    Assessment & Plan:  DM, therapy limited by pt's need for an inexpensive and simple insulin regimen.   Flat feet noted on x-ray

## 2011-04-24 LAB — HEMOGLOBIN A1C
Hgb A1c MFr Bld: 6.5 % — ABNORMAL HIGH (ref <5.7)
Mean Plasma Glucose: 140 mg/dL — ABNORMAL HIGH (ref <117)

## 2011-04-24 NOTE — Patient Instructions (Signed)
i left message on phone tree Reduce am insulin to 15

## 2011-04-30 ENCOUNTER — Telehealth: Payer: Self-pay

## 2011-04-30 ENCOUNTER — Other Ambulatory Visit: Payer: Self-pay | Admitting: Internal Medicine

## 2011-04-30 MED ORDER — ISOSORBIDE MONONITRATE ER 30 MG PO TB24: ORAL_TABLET | ORAL | Status: DC

## 2011-04-30 NOTE — Telephone Encounter (Signed)
Spoke with patient, patient states she never received 90 day supply from [facility], patient will f/u with them to get a status update. Patient requested 30 day supply to be sent to [facility] to allow time to see what the status is on mail-order

## 2011-04-30 NOTE — Telephone Encounter (Signed)
Patient called and stated [facility] never received rx fro Jan 2013, patient would like rx re-sent, done

## 2011-05-02 ENCOUNTER — Other Ambulatory Visit: Payer: Self-pay | Admitting: Internal Medicine

## 2011-06-15 ENCOUNTER — Other Ambulatory Visit: Payer: Self-pay

## 2011-06-15 MED ORDER — QUINAPRIL HCL 40 MG PO TABS: ORAL_TABLET | ORAL | Status: DC

## 2011-06-15 MED ORDER — OMEPRAZOLE 20 MG PO CPDR: DELAYED_RELEASE_CAPSULE | ORAL | Status: DC

## 2011-06-15 NOTE — Telephone Encounter (Signed)
Please send orders for labs? Patient is coming in on Monday April 9 at 9

## 2011-06-15 NOTE — Telephone Encounter (Signed)
Both OK X3 mos 

## 2011-06-15 NOTE — Telephone Encounter (Signed)
Done

## 2011-06-19 ENCOUNTER — Other Ambulatory Visit (INDEPENDENT_AMBULATORY_CARE_PROVIDER_SITE_OTHER): Payer: Medicare Other

## 2011-06-19 DIAGNOSIS — E785 Hyperlipidemia, unspecified: Secondary | ICD-10-CM

## 2011-06-19 DIAGNOSIS — T887XXA Unspecified adverse effect of drug or medicament, initial encounter: Secondary | ICD-10-CM

## 2011-06-19 LAB — LIPID PANEL
Cholesterol: 196 mg/dL (ref 0–200)
HDL: 88.2 mg/dL (ref >39.00)
LDL Cholesterol: 91 mg/dL (ref 0–99)
Total CHOL/HDL Ratio: 2
Triglycerides: 84 mg/dL (ref 0.0–149.0)
VLDL: 16.8 mg/dL (ref 0.0–40.0)

## 2011-06-19 LAB — HEPATIC FUNCTION PANEL
ALT: 21 U/L (ref 0–35)
AST: 22 U/L (ref 0–37)
Albumin: 3.9 g/dL (ref 3.5–5.2)
Alkaline Phosphatase: 62 U/L (ref 39–117)
Bilirubin, Direct: 0 mg/dL (ref 0.0–0.3)
Total Bilirubin: 0.4 mg/dL (ref 0.3–1.2)
Total Protein: 7 g/dL (ref 6.0–8.3)

## 2011-07-06 ENCOUNTER — Telehealth: Payer: Self-pay | Admitting: *Deleted

## 2011-07-06 NOTE — Telephone Encounter (Signed)
Office Message [address] [place], [place] [address] p. [phone] f. [phone] To: [facility] (Daytime Triage) Fax: [phone] From: [facility] Date/ Time: 07/06/2011 11:56 AM Taken By: Caswell Corwin, RN Caller: Breckin Facility: Not Collected Patient: Katherinne, Mofield DOB: 10/18/1932 Phone: [phone] Reason for Call: Caller was unable to be reached on callback - No Answer Regarding Appointment: No Appt Date: Appt Time: Unknown Provider: Reason: Details: Outcome:

## 2011-07-06 NOTE — Telephone Encounter (Signed)
Tried to call Pt as well no answer just static on the phone, will try again on Monday.

## 2011-07-09 MED ORDER — ATORVASTATIN CALCIUM 10 MG PO TABS: ORAL_TABLET | ORAL | Status: DC

## 2011-07-09 NOTE — Telephone Encounter (Signed)
Pt states that she had a tick on her that she removed. Pt states that area was red and swollen but most of swelling is gone just a little redness remains.  Pt denies any itching, fever or tenderness on area. Pt notes that she has applied alcohol and neosporin to area which has seem to help.Please advise

## 2011-07-10 NOTE — Telephone Encounter (Signed)
She should be seen if she is having fever, headaches, or rash

## 2011-07-10 NOTE — Telephone Encounter (Signed)
Pt states that she does have a rash that has develop on her face so she would like to come in for OV. Pt still denies any fever. Appt scheduled.

## 2011-07-12 ENCOUNTER — Ambulatory Visit (INDEPENDENT_AMBULATORY_CARE_PROVIDER_SITE_OTHER): Payer: Medicare Other | Admitting: Internal Medicine

## 2011-07-12 ENCOUNTER — Encounter: Payer: Self-pay | Admitting: Internal Medicine

## 2011-07-12 VITALS — BP 136/70 | HR 64 | Temp 97.5°F | Wt 202.0 lb

## 2011-07-12 DIAGNOSIS — T148 Other injury of unspecified body region: Secondary | ICD-10-CM

## 2011-07-12 DIAGNOSIS — W57XXXA Bitten or stung by nonvenomous insect and other nonvenomous arthropods, initial encounter: Secondary | ICD-10-CM

## 2011-07-12 DIAGNOSIS — R21 Rash and other nonspecific skin eruption: Secondary | ICD-10-CM

## 2011-07-12 MED ORDER — DOXYCYCLINE HYCLATE 100 MG PO TABS: 100.0000 mg | ORAL_TABLET | Freq: Two times a day (BID) | ORAL | Status: AC

## 2011-07-12 NOTE — Patient Instructions (Signed)
Report  Warning Signs as discussed: fever, headache, rash .Avoid sun while on Doxycycline.

## 2011-07-12 NOTE — Progress Notes (Signed)
  Subjective:    Patient ID: Rebecca [name], female    DOB: 10/08/1932, 78 y.o.   MRN: [id number]  HPI She found a tick on her left upper arm area 07/05/11 after working in her yard. The tick was removed by a family member. By 2 days later she noted a faint rash over her face; this resolved with alcohol cleansing.  She denies headache, chest pain, shortness of breath, fever, chills, or sweats.    Review of Systems     Objective:   Physical Exam  She appears healthy and well-nourished and in no acute distress.  There is no facial rash.  There is no lymphadenopathy about the neck or axilla.  Neck is supple with full range of motion  There is a small papule at the left anterior axillary line on the upper biceps area. There is no associated cellulitis.  Chest is clear with no increased work of breathing or abnormal breath sounds.  She has a regular rhythm with a grade 1 systolic murmur            Assessment & Plan:  #1 tick bite 4/25 followed by facial rash 4/27.  Plan: Doxycycline will be recommended for 10 days. She will need to avoid direct sun exposure during that time.

## 2011-07-23 ENCOUNTER — Ambulatory Visit: Payer: Medicare Other | Admitting: Endocrinology

## 2011-07-27 ENCOUNTER — Other Ambulatory Visit: Payer: Self-pay | Admitting: Internal Medicine

## 2011-08-07 ENCOUNTER — Other Ambulatory Visit (INDEPENDENT_AMBULATORY_CARE_PROVIDER_SITE_OTHER): Payer: Medicare Other

## 2011-08-07 ENCOUNTER — Encounter: Payer: Self-pay | Admitting: Endocrinology

## 2011-08-07 ENCOUNTER — Ambulatory Visit (INDEPENDENT_AMBULATORY_CARE_PROVIDER_SITE_OTHER): Payer: Medicare Other | Admitting: Endocrinology

## 2011-08-07 VITALS — BP 124/66 | HR 66 | Temp 98.2°F | Ht 64.0 in | Wt 196.0 lb

## 2011-08-07 DIAGNOSIS — E1049 Type 1 diabetes mellitus with other diabetic neurological complication: Secondary | ICD-10-CM

## 2011-08-07 DIAGNOSIS — E1065 Type 1 diabetes mellitus with hyperglycemia: Secondary | ICD-10-CM

## 2011-08-07 DIAGNOSIS — E1149 Type 2 diabetes mellitus with other diabetic neurological complication: Secondary | ICD-10-CM

## 2011-08-07 DIAGNOSIS — E1142 Type 2 diabetes mellitus with diabetic polyneuropathy: Secondary | ICD-10-CM

## 2011-08-07 LAB — HEMOGLOBIN A1C: Hgb A1c MFr Bld: 8.1 % — ABNORMAL HIGH (ref 4.6–6.5)

## 2011-08-07 NOTE — Patient Instructions (Addendum)
blood tests are being requested for you today.  You will receive a letter with results. pending the test results, please reduce insulin to 15 units with breakfast, and 5 with the evening meal check your blood sugar 2 times a day.  vary the time of day when you check, between before the 3 meals, and at bedtime.  also check if you have symptoms of your blood sugar being too high or too low.  please keep a record of the readings and bring it to your next appointment here.  please call us sooner if you are having low blood sugar episodes.   Please make a follow-up appointment in 3 months.

## 2011-08-07 NOTE — Progress Notes (Signed)
Subjective:    Patient ID: Rebecca [name], female    DOB: 09-12-1932, 78 y.o.   MRN: [id number]  HPI Patient returns for follow-up of insulin-requiring DM (dx'ed 1969; complicated by CAD and peripheral sensory neuropathy).  pt states she feels well in general.  She has chosen bid insulin.  she brings a record of her cbg's which i have reviewed today.  It varies from 78-150.  It is in general lowest in the afternoon, if she is active.  She has been taking 20 units in the morning. Past Medical History  Diagnosis Date  . CAD (coronary artery disease)   . Hypercholesteremia   . Hypertension   . Sickle-cell trait   . Anemia, unspecified   . Ankle pain, left   . Esophageal reflux   . Multinodular goiter   . Hiatal hernia   . IDDM (insulin dependent diabetes mellitus)     Type II  . Fibrocystic breast disease     Past Surgical History  Procedure Date  . Coronary angioplasty   . Biopsy thyroid     Dr.Maysun Meditz-Neg   . Retinal laser procedure   . Foot surgery 2004  . Back surgery 1990  . "female" 31    History   Social History  . Marital Status: Widowed    Spouse Name: N/A    Number of Children: N/A  . Years of Education: N/A   Occupational History  . Not on file.   Social History Main Topics  . Smoking status: Never Smoker   . Smokeless tobacco: Not on file  . Alcohol Use: No  . Drug Use: No  . Sexually Active: Not on file   Other Topics Concern  . Not on file   Social History Narrative   WidowedGets regular exercise    Current Outpatient Prescriptions on File Prior to Visit  Medication Sig Dispense Refill  . amLODipine (NORVASC) 10 MG tablet TAKE ONE TABLET BY MOUTH EVERY DAY  90 tablet  1  . Ascorbic Acid (VITAMIN C) 500 MG tablet Take 500 mg by mouth 2 (two) times daily.        Marland Kitchen aspirin 81 MG tablet Take 81 mg by mouth daily.        Marland Kitchen atorvastatin (LIPITOR) 10 MG tablet TAKE ONE TABLET BY MOUTH EVERY DAY  90 tablet  3  . calcium carbonate (OS-CAL) 600  MG TABS Take 600 mg by mouth 2 (two) times daily with a meal.        . Cholecalciferol (VITAMIN D3) 5000 UNITS CAPS Take 5,000 Units by mouth daily.        Marland Kitchen Cod Liver Oil 1000 MG CAPS Take 1,000 mg by mouth daily. During the winter      . hydrochlorothiazide (MICROZIDE) 12.5 MG capsule Take 1 capsule (12.5 mg total) by mouth daily as needed.  90 capsule  1  . insulin NPH-insulin regular (NOVOLIN 70/30) (70-30) 100 UNIT/ML injection Take 15 units with breakfast, and 5 units with the evening meal.      . isosorbide mononitrate (IMDUR) 30 MG 24 hr tablet TAKE ONE TABLET BY MOUTH EVERY DAY  90 tablet  1  . Multiple Vitamin (MULTIVITAMIN) tablet Take 1 tablet by mouth daily.        Marland Kitchen NITROSTAT 0.4 MG SL tablet DISSOLVE ONE TABLET UNDER TONGUE AS DIRECTED FOR CHEST PAIN  25 each  3  . omeprazole (PRILOSEC) 20 MG capsule TAKE ONE CAPSULE BY MOUTH EVERY DAY IN THE MORNING  90 capsule  1  . quinapril (ACCUPRIL) 40 MG tablet TAKE ONE TABLET BY MOUTH EVERY DAY  90 tablet  1  . traMADol (ULTRAM) 50 MG tablet INITIALLY TAKE ONE - HALF TABLET EVERY 6 HOURS AS NEEDED, THEN MAY TAKE ONE TABLET EVERY 6 HOURS AS NEEDED FOR PAIN  30 tablet  0  . vitamin E (VITAMIN E) 400 UNIT capsule Take 400 Units by mouth daily.        Marland Kitchen LORazepam (ATIVAN) 0.5 MG tablet TAKE ONE TABLET BY MOUTH EVERY 8 HOURS AS NEEDED FOR ANXIETY  30 tablet  1  . DISCONTD: isosorbide mononitrate (IMDUR) 30 MG 24 hr tablet TAKE ONE TABLET BY MOUTH EVERY DAY  30 tablet  0  . DISCONTD: isosorbide mononitrate (IMDUR) 30 MG 24 hr tablet TAKE ONE TABLET BY MOUTH EVERY DAY  30 tablet  0    Allergies  Allergen Reactions  . Codeine     "Makes me drunk"    Family History  Problem Relation Age of Onset  . Hypertension    . Lung cancer    . Heart disease    . Gout Sister   . Gout Brother   . Heart attack Father   . Heart attack Mother   . Gout Brother     BP 124/66  Pulse 66  Temp(Src) 98.2 F (36.8 C) (Oral)  Ht 5\' 4"  (1.626 m)  Wt 196  lb (88.905 kg)  BMI 33.64 kg/m2  SpO2 97%   Review of Systems Denies loc    Objective:   Physical Exam VITAL SIGNS:  See vs page GENERAL: no distress SKIN:  Insulin injection sites at the anterior abdomen are normal       Assessment & Plan:  DM, overcontrolled

## 2011-08-08 ENCOUNTER — Telehealth: Payer: Self-pay | Admitting: *Deleted

## 2011-08-08 NOTE — Telephone Encounter (Signed)
Pt informed of lab results and of MD's advisement regarding insulin adjustment.

## 2011-08-08 NOTE — Telephone Encounter (Signed)
Called pt to inform of A1c lab results, left message for pt to callback office (letter also mailed to pt).

## 2011-09-03 ENCOUNTER — Ambulatory Visit (INDEPENDENT_AMBULATORY_CARE_PROVIDER_SITE_OTHER): Payer: Medicare Other | Admitting: Cardiology

## 2011-09-03 ENCOUNTER — Encounter: Payer: Self-pay | Admitting: Cardiology

## 2011-09-03 VITALS — BP 120/54 | HR 59 | Ht 64.0 in | Wt 202.0 lb

## 2011-09-03 DIAGNOSIS — I10 Essential (primary) hypertension: Secondary | ICD-10-CM

## 2011-09-03 DIAGNOSIS — I2510 Atherosclerotic heart disease of native coronary artery without angina pectoris: Secondary | ICD-10-CM

## 2011-09-03 DIAGNOSIS — E7800 Pure hypercholesterolemia, unspecified: Secondary | ICD-10-CM

## 2011-09-03 NOTE — Assessment & Plan Note (Signed)
Will check lipid and liver profile.   

## 2011-09-03 NOTE — Assessment & Plan Note (Signed)
Will recheck BMET.  BP well controlled.

## 2011-09-03 NOTE — Patient Instructions (Addendum)
Your physician recommends that you return for a FASTING LIPID, LIVER and BMP on 09/04/11--nothing to eat or drink after midnight, lab opens at 8:30  Your physician wants you to follow-up in: [facility] 2014. You will receive a reminder letter in the mail two months in advance. If you don't receive a letter, please call our office to schedule the follow-up appointment.  Your physician recommends that you continue on your current medications as directed. Please refer to the Current Medication list given to you today.

## 2011-09-03 NOTE — Assessment & Plan Note (Signed)
She remains stable from a cardiac standpoint.  Continued medical therapy is suggested.  No current symptoms.  Follow up in March of 2014.

## 2011-09-03 NOTE — Progress Notes (Signed)
HPI:  The patient is in for followup.  She is stable at the present time. She's not having any chest pain. She still does her own grass. Her diabetes is been ruled her out control as of late. She rarely uses her fluid pill. She's not had any  lab work quite some time.  Current Outpatient Prescriptions  Medication Sig Dispense Refill  . amLODipine (NORVASC) 10 MG tablet TAKE ONE TABLET BY MOUTH EVERY DAY  90 tablet  1  . Ascorbic Acid (VITAMIN C) 500 MG tablet Take 500 mg by mouth 2 (two) times daily.        Marland Kitchen aspirin 81 MG tablet Take 81 mg by mouth daily.        Marland Kitchen atorvastatin (LIPITOR) 10 MG tablet TAKE ONE TABLET BY MOUTH EVERY DAY  90 tablet  3  . calcium carbonate (OS-CAL) 600 MG TABS Take 600 mg by mouth 2 (two) times daily with a meal.        . Cholecalciferol (VITAMIN D3) 5000 UNITS CAPS Take 5,000 Units by mouth daily.        . hydrochlorothiazide (MICROZIDE) 12.5 MG capsule Take 1 capsule (12.5 mg total) by mouth daily as needed.  90 capsule  1  . insulin NPH-insulin regular (NOVOLIN 70/30) (70-30) 100 UNIT/ML injection Take 18 units with breakfast, and 7 units with the evening meal.  10 mL  0  . isosorbide mononitrate (IMDUR) 30 MG 24 hr tablet TAKE ONE TABLET BY MOUTH EVERY DAY  90 tablet  1  . LORazepam (ATIVAN) 0.5 MG tablet TAKE ONE TABLET BY MOUTH EVERY 8 HOURS AS NEEDED FOR ANXIETY  30 tablet  1  . Multiple Vitamin (MULTIVITAMIN) tablet Take 1 tablet by mouth daily.        Marland Kitchen NITROSTAT 0.4 MG SL tablet DISSOLVE ONE TABLET UNDER TONGUE AS DIRECTED FOR CHEST PAIN  25 each  3  . omeprazole (PRILOSEC) 20 MG capsule TAKE ONE CAPSULE BY MOUTH EVERY DAY IN THE MORNING  90 capsule  1  . quinapril (ACCUPRIL) 40 MG tablet TAKE ONE TABLET BY MOUTH EVERY DAY  90 tablet  1  . traMADol (ULTRAM) 50 MG tablet INITIALLY TAKE ONE - HALF TABLET EVERY 6 HOURS AS NEEDED, THEN MAY TAKE ONE TABLET EVERY 6 HOURS AS NEEDED FOR PAIN  30 tablet  0  . vitamin E (VITAMIN E) 400 UNIT capsule Take 400 Units  by mouth daily.        Marland Kitchen Cod Liver Oil 1000 MG CAPS Take 1,000 mg by mouth daily. During the winter      . doxycycline (VIBRA-TABS) 100 MG tablet         Allergies  Allergen Reactions  . Codeine     "Makes me drunk"    Past Medical History  Diagnosis Date  . CAD (coronary artery disease)   . Hypercholesteremia   . Hypertension   . Sickle-cell trait   . Anemia, unspecified   . Ankle pain, left   . Esophageal reflux   . Multinodular goiter   . Hiatal hernia   . IDDM (insulin dependent diabetes mellitus)     Type II  . Fibrocystic breast disease     Past Surgical History  Procedure Date  . Coronary angioplasty   . Biopsy thyroid     Dr.Ellison-Neg   . Retinal laser procedure   . Foot surgery 2004  . Back surgery 1990  . "female" 5    Family History  Problem Relation Age of Onset  . Hypertension    . Lung cancer    . Heart disease    . Gout Sister   . Gout Brother   . Heart attack Father   . Heart attack Mother   . Gout Brother     History   Social History  . Marital Status: Widowed    Spouse Name: N/A    Number of Children: N/A  . Years of Education: N/A   Occupational History  . Not on file.   Social History Main Topics  . Smoking status: Never Smoker   . Smokeless tobacco: Never Used  . Alcohol Use: No  . Drug Use: No  . Sexually Active: Not on file   Other Topics Concern  . Not on file   Social History Narrative   WidowedGets regular exercise    ROS: Please see the HPI.  All other systems reviewed and negative.  PHYSICAL EXAM:  BP 120/54  Pulse 59  Ht 5\' 4"  (1.626 m)  Wt 202 lb (91.627 kg)  BMI 34.67 kg/m2  General: Well developed, well nourished, in no acute distress. Head:  Normocephalic and atraumatic. Neck: no JVD Lungs: Clear to auscultation and percussion. Heart: Normal S1 and S2.  No murmur, rubs or gallops.  Pulses: Pulses normal in all 4 extremities. Extremities: No clubbing or cyanosis. No edema. Neurologic:  Alert and oriented x 3.  EKG:  SB.  Low voltage QRS.  Delay in R wave progression (leftward axis).  No acute changes.   ASSESSMENT AND PLAN:

## 2011-09-04 ENCOUNTER — Other Ambulatory Visit (INDEPENDENT_AMBULATORY_CARE_PROVIDER_SITE_OTHER): Payer: Medicare Other

## 2011-09-04 DIAGNOSIS — I2510 Atherosclerotic heart disease of native coronary artery without angina pectoris: Secondary | ICD-10-CM

## 2011-09-04 LAB — BASIC METABOLIC PANEL
BUN: 26 mg/dL — ABNORMAL HIGH (ref 6–23)
CO2: 25 mEq/L (ref 19–32)
Calcium: 9.2 mg/dL (ref 8.4–10.5)
Chloride: 106 mEq/L (ref 96–112)
Creatinine, Ser: 1.3 mg/dL — ABNORMAL HIGH (ref 0.4–1.2)
GFR: 50.16 mL/min — ABNORMAL LOW (ref >60.00)
Glucose, Bld: 125 mg/dL — ABNORMAL HIGH (ref 70–99)
Potassium: 4.4 mEq/L (ref 3.5–5.1)
Sodium: 140 mEq/L (ref 135–145)

## 2011-09-04 LAB — LIPID PANEL
Cholesterol: 164 mg/dL (ref 0–200)
HDL: 83.2 mg/dL (ref >39.00)
LDL Cholesterol: 64 mg/dL (ref 0–99)
Total CHOL/HDL Ratio: 2
Triglycerides: 85 mg/dL (ref 0.0–149.0)
VLDL: 17 mg/dL (ref 0.0–40.0)

## 2011-09-04 LAB — HEPATIC FUNCTION PANEL
ALT: 17 U/L (ref 0–35)
AST: 22 U/L (ref 0–37)
Albumin: 3.4 g/dL — ABNORMAL LOW (ref 3.5–5.2)
Alkaline Phosphatase: 52 U/L (ref 39–117)
Bilirubin, Direct: 0 mg/dL (ref 0.0–0.3)
Total Bilirubin: 0.5 mg/dL (ref 0.3–1.2)
Total Protein: 6.4 g/dL (ref 6.0–8.3)

## 2011-09-11 ENCOUNTER — Other Ambulatory Visit: Payer: Self-pay | Admitting: *Deleted

## 2011-09-11 MED ORDER — ISOSORBIDE MONONITRATE ER 30 MG PO TB24: ORAL_TABLET | ORAL | Status: DC

## 2011-09-11 MED ORDER — OMEPRAZOLE 20 MG PO CPDR: DELAYED_RELEASE_CAPSULE | ORAL | Status: DC

## 2011-09-11 MED ORDER — AMLODIPINE BESYLATE 10 MG PO TABS: ORAL_TABLET | ORAL | Status: DC

## 2011-09-11 NOTE — Telephone Encounter (Signed)
Rx sent 

## 2011-10-01 ENCOUNTER — Other Ambulatory Visit: Payer: Self-pay | Admitting: *Deleted

## 2011-10-01 MED ORDER — GLUCOSE BLOOD VI STRP: ORAL_STRIP | Status: DC

## 2011-10-01 NOTE — Telephone Encounter (Signed)
R'cd fax from [facility] for refill of test strips.

## 2011-11-06 ENCOUNTER — Other Ambulatory Visit: Payer: Self-pay | Admitting: Internal Medicine

## 2011-11-06 ENCOUNTER — Other Ambulatory Visit: Payer: Self-pay | Admitting: Endocrinology

## 2011-11-07 ENCOUNTER — Ambulatory Visit: Payer: Medicare Other | Admitting: Endocrinology

## 2011-11-21 ENCOUNTER — Encounter: Payer: Self-pay | Admitting: Endocrinology

## 2011-11-21 ENCOUNTER — Ambulatory Visit (INDEPENDENT_AMBULATORY_CARE_PROVIDER_SITE_OTHER): Payer: Medicare Other | Admitting: Endocrinology

## 2011-11-21 ENCOUNTER — Other Ambulatory Visit (INDEPENDENT_AMBULATORY_CARE_PROVIDER_SITE_OTHER): Payer: Medicare Other

## 2011-11-21 VITALS — BP 132/60 | HR 58 | Temp 97.5°F | Ht 64.0 in | Wt 195.0 lb

## 2011-11-21 DIAGNOSIS — E1049 Type 1 diabetes mellitus with other diabetic neurological complication: Secondary | ICD-10-CM

## 2011-11-21 DIAGNOSIS — E1065 Type 1 diabetes mellitus with hyperglycemia: Secondary | ICD-10-CM

## 2011-11-21 DIAGNOSIS — E1149 Type 2 diabetes mellitus with other diabetic neurological complication: Secondary | ICD-10-CM

## 2011-11-21 DIAGNOSIS — E1142 Type 2 diabetes mellitus with diabetic polyneuropathy: Secondary | ICD-10-CM

## 2011-11-21 LAB — HEMOGLOBIN A1C: Hgb A1c MFr Bld: 7.9 % — ABNORMAL HIGH (ref 4.6–6.5)

## 2011-11-21 NOTE — Patient Instructions (Addendum)
blood tests are being requested for you today.  You will receive a letter with results.  pending the test results, please take insulin, 18 units with breakfast, and 7 with the evening meal.  If you are going to be active during the day, take only 10 units that morning.  check your blood sugar 2 times a day.  vary the time of day when you check, between before the 3 meals, and at bedtime.  also check if you have symptoms of your blood sugar being too high or too low.  please keep a record of the readings and bring it to your next appointment here.  please call us sooner if you are having low blood sugar episodes.   Please make a follow-up appointment in 3 months.

## 2011-11-21 NOTE — Progress Notes (Signed)
Subjective:    Patient ID: Rebecca [name], female    DOB: [birth year], 78 y.o.   MRN: [id number]  HPI Patient returns for follow-up of insulin-requiring DM (dx'ed 1969; complicated by CAD and peripheral sensory neuropathy; she has chosen bid insulin, for simplicity and low cost).  she brings a record of her cbg's which i have reviewed today.  It varies from 70-200, but the vast majority are approx 100.  She takes 20 units am and 5 units pm.   Past Medical History  Diagnosis Date  . CAD (coronary artery disease)   . Hypercholesteremia   . Hypertension   . Sickle-cell trait   . Anemia, unspecified   . Ankle pain, left   . Esophageal reflux   . Multinodular goiter   . Hiatal hernia   . IDDM (insulin dependent diabetes mellitus)     Type II  . Fibrocystic breast disease     Past Surgical History  Procedure Date  . Coronary angioplasty   . Biopsy thyroid     Dr.Letticia Bhattacharyya-Neg   . Retinal laser procedure   . Foot surgery 2004  . Back surgery 1990  . "female" 49    History   Social History  . Marital Status: Widowed    Spouse Name: N/A    Number of Children: N/A  . Years of Education: N/A   Occupational History  . Not on file.   Social History Main Topics  . Smoking status: Never Smoker   . Smokeless tobacco: Never Used  . Alcohol Use: No  . Drug Use: No  . Sexually Active: Not on file   Other Topics Concern  . Not on file   Social History Narrative   WidowedGets regular exercise    Current Outpatient Prescriptions on File Prior to Visit  Medication Sig Dispense Refill  . amLODipine (NORVASC) 10 MG tablet TAKE ONE TABLET BY MOUTH EVERY DAY  90 tablet  1  . Ascorbic Acid (VITAMIN C) 500 MG tablet Take 500 mg by mouth 2 (two) times daily.        Marland Kitchen aspirin 81 MG tablet Take 81 mg by mouth daily.        Marland Kitchen atorvastatin (LIPITOR) 10 MG tablet TAKE ONE TABLET BY MOUTH EVERY DAY  90 tablet  3  . calcium carbonate (OS-CAL) 600 MG TABS Take 600 mg by mouth 2 (two)  times daily with a meal.        . Cholecalciferol (VITAMIN D3) 5000 UNITS CAPS Take 5,000 Units by mouth daily.        Marland Kitchen Cod Liver Oil 1000 MG CAPS Take 1,000 mg by mouth daily. During the winter      . doxycycline (VIBRA-TABS) 100 MG tablet       . glucose blood (PRODIGY AUTOCODE TEST) test strip Use as instructed to check blood sugar twice daily dx 250.63  200 each  3  . hydrochlorothiazide (MICROZIDE) 12.5 MG capsule Take 1 capsule (12.5 mg total) by mouth daily as needed.  90 capsule  1  . insulin NPH-insulin regular (NOVOLIN 70/30) (70-30) 100 UNIT/ML injection Inject subcutaneously 18 units with breakfast and 7 units with the evening meal  10 mL  5  . isosorbide mononitrate (IMDUR) 30 MG 24 hr tablet TAKE ONE TABLET BY MOUTH EVERY DAY  90 tablet  1  . Multiple Vitamin (MULTIVITAMIN) tablet Take 1 tablet by mouth daily.        Marland Kitchen NITROSTAT 0.4 MG SL tablet DISSOLVE ONE  TABLET UNDER TONGUE AS DIRECTED FOR CHEST PAIN  25 each  3  . omeprazole (PRILOSEC) 20 MG capsule TAKE ONE CAPSULE BY MOUTH EVERY DAY IN THE MORNING  90 capsule  1  . quinapril (ACCUPRIL) 40 MG tablet TAKE ONE TABLET BY MOUTH EVERY DAY  90 tablet  1  . traMADol (ULTRAM) 50 MG tablet TAKE ONE-HALF TABLET BY MOUTH EVERY 6 HOURS AS NEEDED, THEN MAY INCREASE TO TAKING ONE TABLET BY MOUTH EVERY 6 HOURS AS NEEDED FOR PAIN  30 tablet  0  . vitamin E (VITAMIN E) 400 UNIT capsule Take 400 Units by mouth daily.        Marland Kitchen LORazepam (ATIVAN) 0.5 MG tablet TAKE ONE TABLET BY MOUTH EVERY 8 HOURS AS NEEDED FOR ANXIETY  30 tablet  1    Allergies  Allergen Reactions  . Codeine     "Makes me drunk"    Family History  Problem Relation Age of Onset  . Hypertension    . Lung cancer    . Heart disease    . Gout Sister   . Gout Brother   . Heart attack Father   . Heart attack Mother   . Gout Brother     BP 132/60  Pulse 58  Temp 97.5 F (36.4 C) (Oral)  Ht 5\' 4"  (1.626 m)  Wt 195 lb (88.451 kg)  BMI 33.47 kg/m2  SpO2  98%    Review of Systems Denies LOC    Objective:   Physical Exam Pulses: dorsalis pedis intact bilat.   Feet: no deformity.  no ulcer on the feet.  feet are of normal color and temp.  no edema Neuro: sensation is intact to touch on the feet    Lab Results  Component Value Date   HGBA1C 7.9* 11/21/2011      Assessment & Plan:  DM: Based on the pattern of her cbg's, she needs some adjustment in her therapy

## 2011-12-06 ENCOUNTER — Telehealth: Payer: Self-pay | Admitting: Internal Medicine

## 2011-12-06 MED ORDER — QUINAPRIL HCL 40 MG PO TABS: ORAL_TABLET | ORAL | Status: DC

## 2011-12-06 NOTE — Telephone Encounter (Signed)
Refill: Quinapril tab 40 mg. Take 1 tablet by mouth every day. Qty 90. Last fill 7.10.13

## 2012-01-21 ENCOUNTER — Other Ambulatory Visit: Payer: Self-pay | Admitting: Endocrinology

## 2012-01-21 MED ORDER — ACCU-CHEK NANO SMARTVIEW W/DEVICE KIT: 1.0000 | PACK | Freq: Once | Status: DC

## 2012-01-21 MED ORDER — GLUCOSE BLOOD VI STRP: 1.0000 | ORAL_STRIP | Freq: Two times a day (BID) | Status: DC

## 2012-01-24 ENCOUNTER — Other Ambulatory Visit: Payer: Self-pay

## 2012-01-24 MED ORDER — ACCU-CHEK NANO SMARTVIEW W/DEVICE KIT: 1.0000 | PACK | Freq: Once | Status: DC

## 2012-02-11 ENCOUNTER — Other Ambulatory Visit: Payer: Self-pay | Admitting: Internal Medicine

## 2012-02-20 ENCOUNTER — Encounter: Payer: Self-pay | Admitting: Endocrinology

## 2012-02-20 ENCOUNTER — Ambulatory Visit (INDEPENDENT_AMBULATORY_CARE_PROVIDER_SITE_OTHER): Payer: Medicare Other | Admitting: Endocrinology

## 2012-02-20 VITALS — BP 134/80 | HR 76 | Temp 98.0°F | Wt 192.0 lb

## 2012-02-20 DIAGNOSIS — E1065 Type 1 diabetes mellitus with hyperglycemia: Secondary | ICD-10-CM

## 2012-02-20 DIAGNOSIS — E1049 Type 1 diabetes mellitus with other diabetic neurological complication: Secondary | ICD-10-CM

## 2012-02-20 DIAGNOSIS — Z23 Encounter for immunization: Secondary | ICD-10-CM

## 2012-02-20 LAB — HEMOGLOBIN A1C
Hgb A1c MFr Bld: 6.5 % — ABNORMAL HIGH (ref <5.7)
Mean Plasma Glucose: 140 mg/dL — ABNORMAL HIGH (ref <117)

## 2012-02-20 NOTE — Progress Notes (Signed)
Subjective:    Patient ID: Rebecca [name], female    DOB: 08/22/1932, 78 y.o.   MRN: [id number]  HPI Patient returns for follow-up of insulin-requiring DM (dx'ed 1969; complicated by CAD and peripheral sensory neuropathy; she has chosen bid insulin, for simplicity and low cost).  she brings a record of her cbg's which i have reviewed today.  It varies from 65-200, but the vast majority are approx 100.  She has mild hypoglycemia in am, approx twice a month.   Past Medical History  Diagnosis Date  . CAD (coronary artery disease)   . Hypercholesteremia   . Hypertension   . Sickle-cell trait   . Anemia, unspecified   . Ankle pain, left   . Esophageal reflux   . Multinodular goiter   . Hiatal hernia   . IDDM (insulin dependent diabetes mellitus)     Type II  . Fibrocystic breast disease     Past Surgical History  Procedure Date  . Coronary angioplasty   . Biopsy thyroid     Dr.Sonna Lipsky-Neg   . Retinal laser procedure   . Foot surgery 2004  . Back surgery 1990  . "female" 58    History   Social History  . Marital Status: Widowed    Spouse Name: N/A    Number of Children: N/A  . Years of Education: N/A   Occupational History  . Not on file.   Social History Main Topics  . Smoking status: Never Smoker   . Smokeless tobacco: Never Used  . Alcohol Use: No  . Drug Use: No  . Sexually Active: Not on file   Other Topics Concern  . Not on file   Social History Narrative   WidowedGets regular exercise    Current Outpatient Prescriptions on File Prior to Visit  Medication Sig Dispense Refill  . amLODipine (NORVASC) 10 MG tablet TAKE ONE TABLET BY MOUTH EVERY DAY  90 tablet  1  . Ascorbic Acid (VITAMIN C) 500 MG tablet Take 500 mg by mouth 2 (two) times daily.        Marland Kitchen aspirin 81 MG tablet Take 81 mg by mouth daily.        Marland Kitchen atorvastatin (LIPITOR) 10 MG tablet TAKE ONE TABLET BY MOUTH EVERY DAY  90 tablet  3  . Blood Glucose Monitoring Suppl (ACCU-CHEK NANO  SMARTVIEW) W/DEVICE KIT 1 Device by Does not apply route once.  1 kit  0  . calcium carbonate (OS-CAL) 600 MG TABS Take 600 mg by mouth 2 (two) times daily with a meal.        . Cholecalciferol (VITAMIN D3) 5000 UNITS CAPS Take 5,000 Units by mouth daily.        Marland Kitchen Cod Liver Oil 1000 MG CAPS Take 1,000 mg by mouth daily. During the winter      . doxycycline (VIBRA-TABS) 100 MG tablet       . glucose blood (ACCU-CHEK SMARTVIEW) test strip 1 each by Other route 2 (two) times daily. Use as instructed  180 each  3  . hydrochlorothiazide (MICROZIDE) 12.5 MG capsule Take 1 capsule (12.5 mg total) by mouth daily as needed.  90 capsule  1  . isosorbide mononitrate (IMDUR) 30 MG 24 hr tablet Take 1 tablet by mouth  every day  90 tablet  1  . LORazepam (ATIVAN) 0.5 MG tablet TAKE ONE TABLET BY MOUTH EVERY 8 HOURS AS NEEDED FOR ANXIETY  30 tablet  1  . Multiple Vitamin (MULTIVITAMIN) tablet  Take 1 tablet by mouth daily.        Marland Kitchen NITROSTAT 0.4 MG SL tablet DISSOLVE ONE TABLET UNDER TONGUE AS DIRECTED FOR CHEST PAIN  25 each  3  . omeprazole (PRILOSEC) 20 MG capsule TAKE ONE CAPSULE BY MOUTH EVERY DAY IN THE MORNING  90 capsule  1  . quinapril (ACCUPRIL) 40 MG tablet TAKE ONE TABLET BY MOUTH EVERY DAY  90 tablet  1  . vitamin E (VITAMIN E) 400 UNIT capsule Take 400 Units by mouth daily.          Allergies  Allergen Reactions  . Codeine     "Makes me drunk"    Family History  Problem Relation Age of Onset  . Hypertension    . Lung cancer    . Heart disease    . Gout Sister   . Gout Brother   . Heart attack Father   . Heart attack Mother   . Gout Brother     BP 134/80  Pulse 76  Temp 98 F (36.7 C) (Oral)  Wt 192 lb (87.091 kg)  SpO2 98%    Review of Systems Denies LOC    Objective:   Physical Exam VITAL SIGNS:  See vs page GENERAL: no distress SKIN:  Insulin injection sites at the anterior abdomen are normal    Lab Results  Component Value Date   HGBA1C 6.5* 02/20/2012       Assessment & Plan:  DM, overcontrolled

## 2012-02-20 NOTE — Patient Instructions (Addendum)
blood tests are being requested for you today.  We'll contact you with results.  pending the test results, please take insulin, 18 units with breakfast, and 6 with the evening meal.  If you are going to be active during the day, take only 10 units that morning.  check your blood sugar 2 times a day.  vary the time of day when you check, between before the 3 meals, and at bedtime.  also check if you have symptoms of your blood sugar being too high or too low.  please keep a record of the readings and bring it to your next appointment here.  please call us sooner if you are having low blood sugar episodes.   Please make a follow-up appointment in 3 months.

## 2012-02-21 ENCOUNTER — Telehealth: Payer: Self-pay | Admitting: *Deleted

## 2012-02-21 ENCOUNTER — Telehealth: Payer: Self-pay | Admitting: Internal Medicine

## 2012-02-21 LAB — MICROALBUMIN / CREATININE URINE RATIO
Creatinine, Urine: 84.4 mg/dL
Microalb Creat Ratio: 26.2 mg/g (ref 0.0–30.0)
Microalb, Ur: 2.21 mg/dL — ABNORMAL HIGH (ref 0.00–1.89)

## 2012-02-21 MED ORDER — TRAMADOL HCL 50 MG PO TABS: ORAL_TABLET | ORAL | Status: DC

## 2012-02-21 NOTE — Telephone Encounter (Signed)
Refill: Trama-dol hcl 50 mg tab. Take one-half tablet by mouth every 6 hours as needed then may increase to taking one tablet by mouth every 6 hours as needed. Qty 30. Last fill 11-06-11

## 2012-02-21 NOTE — Telephone Encounter (Signed)
Message copied by Elnora Morrison on Thu Feb 21, 2012  4:27 PM ------      Message from: Romero Belling      Created: Thu Feb 21, 2012  3:31 PM       please call patient:      Blood sugar is a little low      Please reduce insulin as we discussed.      Call if low-blood sugar happens again

## 2012-02-21 NOTE — Telephone Encounter (Signed)
PATIENT NOTIFIED OF LAB RESULTS AND TO DECREASE INSULIN AS DR. ELLISON HAD DISCUSSED WITH HER.

## 2012-03-08 ENCOUNTER — Other Ambulatory Visit: Payer: Self-pay | Admitting: Internal Medicine

## 2012-03-10 NOTE — Telephone Encounter (Signed)
Refill done.  

## 2012-05-07 ENCOUNTER — Other Ambulatory Visit: Payer: Self-pay | Admitting: Internal Medicine

## 2012-05-07 NOTE — Telephone Encounter (Signed)
Patient aware Controlled Substance Contract to be sign and rx to be picked up   

## 2012-05-14 ENCOUNTER — Ambulatory Visit: Payer: Medicare Other | Admitting: Endocrinology

## 2012-06-02 ENCOUNTER — Ambulatory Visit (INDEPENDENT_AMBULATORY_CARE_PROVIDER_SITE_OTHER): Payer: Medicare Other | Admitting: Cardiology

## 2012-06-02 ENCOUNTER — Encounter: Payer: Self-pay | Admitting: Cardiology

## 2012-06-02 VITALS — BP 120/64 | HR 61 | Ht 64.0 in | Wt 200.0 lb

## 2012-06-02 DIAGNOSIS — I2510 Atherosclerotic heart disease of native coronary artery without angina pectoris: Secondary | ICD-10-CM

## 2012-06-02 DIAGNOSIS — E7800 Pure hypercholesterolemia, unspecified: Secondary | ICD-10-CM

## 2012-06-02 DIAGNOSIS — I10 Essential (primary) hypertension: Secondary | ICD-10-CM

## 2012-06-02 DIAGNOSIS — I2584 Coronary atherosclerosis due to calcified coronary lesion: Secondary | ICD-10-CM

## 2012-06-02 LAB — BASIC METABOLIC PANEL
BUN: 18 mg/dL (ref 6–23)
CO2: 25 mEq/L (ref 19–32)
Calcium: 9.3 mg/dL (ref 8.4–10.5)
Chloride: 102 mEq/L (ref 96–112)
Creatinine, Ser: 0.9 mg/dL (ref 0.4–1.2)
GFR: 82.08 mL/min (ref >60.00)
Glucose, Bld: 138 mg/dL — ABNORMAL HIGH (ref 70–99)
Potassium: 3.9 mEq/L (ref 3.5–5.1)
Sodium: 137 mEq/L (ref 135–145)

## 2012-06-02 MED ORDER — NITROGLYCERIN 0.4 MG SL SUBL: SUBLINGUAL_TABLET | SUBLINGUAL | Status: DC

## 2012-06-02 NOTE — Progress Notes (Signed)
HPI:  This very nice patient returns today in followup. From a cardiac standpoint she is doing well. She had a percutaneous intervention the 1990s, and has had fairly regular followup here ever since. Her biggest complaints are arthritis involving the low back, right knee, and left ankle. She uses a diuretic, but only on an intermittent basis. She denies any ongoing chest pain or progressive shortness of breath. I have encouraged her to follow up with Dr. Alwyn Ren, and have her arthritis reevaluated. Cardiac-wise she has no major complaints today. She seems to be getting along well.  Current Outpatient Prescriptions  Medication Sig Dispense Refill  . amLODipine (NORVASC) 10 MG tablet Take 1 tablet by mouth  every day  90 tablet  0  . Ascorbic Acid (VITAMIN C) 500 MG tablet Take 500 mg by mouth 2 (two) times daily.        Marland Kitchen aspirin 81 MG tablet Take 81 mg by mouth daily.        Marland Kitchen atorvastatin (LIPITOR) 10 MG tablet TAKE ONE TABLET BY MOUTH EVERY DAY  90 tablet  3  . Blood Glucose Monitoring Suppl (ACCU-CHEK NANO SMARTVIEW) W/DEVICE KIT 1 Device by Does not apply route once.  1 kit  0  . calcium carbonate (OS-CAL) 600 MG TABS Take 600 mg by mouth 2 (two) times daily with a meal.        . Cholecalciferol (VITAMIN D3) 5000 UNITS CAPS Take 5,000 Units by mouth daily.        Marland Kitchen Cod Liver Oil 1000 MG CAPS Take 1,000 mg by mouth daily. During the winter      . glucose blood (ACCU-CHEK SMARTVIEW) test strip 1 each by Other route 2 (two) times daily. Use as instructed  180 each  3  . hydrochlorothiazide (MICROZIDE) 12.5 MG capsule Take 1 capsule (12.5 mg total) by mouth daily as needed.  90 capsule  1  . insulin NPH-insulin regular (NOVOLIN 70/30) (70-30) 100 UNIT/ML injection Inject subcutaneously 18 units with breakfast and 6 units with the evening meal      . isosorbide mononitrate (IMDUR) 30 MG 24 hr tablet Take 1 tablet by mouth  every day  90 tablet  1  . LORazepam (ATIVAN) 0.5 MG tablet TAKE ONE TABLET  BY MOUTH EVERY 6 HOURS AS NEEDED FOR ANXIETY  30 tablet  0  . Multiple Vitamin (MULTIVITAMIN) tablet Take 1 tablet by mouth daily.        Marland Kitchen NITROSTAT 0.4 MG SL tablet DISSOLVE ONE TABLET UNDER TONGUE AS DIRECTED FOR CHEST PAIN  25 each  3  . omeprazole (PRILOSEC) 20 MG capsule Take 1 capsule by mouth  every day in the morning  90 capsule  0  . quinapril (ACCUPRIL) 40 MG tablet TAKE ONE TABLET BY MOUTH EVERY DAY  90 tablet  1  . traMADol (ULTRAM) 50 MG tablet TAKE ONE-HALF TABLET BY MOUTH EVERY 6 HOURS AS NEEDED, MAY INCREASE TO TAKING ONE TABLET BY MOUTH EVERY 6 HOURS AS NEEDED FOR PAIN  30 tablet  0  . vitamin E (VITAMIN E) 400 UNIT capsule Take 400 Units by mouth daily.         No current facility-administered medications for this visit.    Allergies  Allergen Reactions  . Codeine     "Makes me drunk"    Past Medical History  Diagnosis Date  . CAD (coronary artery disease)   . Hypercholesteremia   . Hypertension   . Sickle-cell trait   .  Anemia, unspecified   . Ankle pain, left   . Esophageal reflux   . Multinodular goiter   . Hiatal hernia   . IDDM (insulin dependent diabetes mellitus)     Type II  . Fibrocystic breast disease     Past Surgical History  Procedure Laterality Date  . Coronary angioplasty    . Biopsy thyroid      Dr.Ellison-Neg   . Retinal laser procedure    . Foot surgery  2004  . Back surgery  1990  . "female"  78    Family History  Problem Relation Age of Onset  . Hypertension    . Lung cancer    . Heart disease    . Gout Sister   . Gout Brother   . Heart attack Father   . Heart attack Mother   . Gout Brother     History   Social History  . Marital Status: Widowed    Spouse Name: N/A    Number of Children: N/A  . Years of Education: N/A   Occupational History  . Not on file.   Social History Main Topics  . Smoking status: Never Smoker   . Smokeless tobacco: Never Used  . Alcohol Use: No  . Drug Use: No  . Sexually Active:  Not on file   Other Topics Concern  . Not on file   Social History Narrative   Widowed   Gets regular exercise    ROS: Please see the HPI.  All other systems reviewed and negative.  PHYSICAL EXAM:  BP 120/64  Pulse 61  Ht 5\' 4"  (1.626 m)  Wt 200 lb (90.719 kg)  BMI 34.31 kg/m2  SpO2 98%  General: Well developed, well nourished, in no acute distress. Head:  Normocephalic and atraumatic. Neck: no JVD Lungs: Clear to auscultation and percussion. Heart: Normal S1 and S2.  No murmur, rubs or gallops.  Pulses: Pulses normal in all 4 extremities. Extremities: No clubbing or cyanosis. No edema. Neurologic: Alert and oriented x 3.  EKG: NSR.  Delay in R wave likely secondary to lead placement.    ASSESSMENT AND PLAN:  1.  CAD SP PCI in 1990s 2.  DM 3.  HTN 4.  Arthritis.    FU Dr. Shirlee Latch.  [facility]

## 2012-06-02 NOTE — Patient Instructions (Addendum)
[facility] today will call you with results.   Your physician wants you to follow-up in 12 months with Dr.McLean You will receive a reminder letter in the mail two months in advance. If you don't receive a letter, please call our office to schedule the follow-up appointment.

## 2012-06-03 ENCOUNTER — Other Ambulatory Visit: Payer: Self-pay | Admitting: Internal Medicine

## 2012-06-03 NOTE — Assessment & Plan Note (Signed)
LDL was at target.  Managed by Dr. Alwyn Ren

## 2012-06-03 NOTE — Assessment & Plan Note (Signed)
She had a remote PCI and is stable.  Records archived.

## 2012-06-03 NOTE — Assessment & Plan Note (Signed)
Controlled at diabetic tarrget on a medical regimen.

## 2012-06-04 ENCOUNTER — Telehealth: Payer: Self-pay | Admitting: Internal Medicine

## 2012-06-04 NOTE — Telephone Encounter (Signed)
DUPLICATE REQUEST, medications were refilled yesterday

## 2012-06-04 NOTE — Telephone Encounter (Signed)
NEW PRESCRIPTION REQUEST     AMLODIPINE TAB  QUINAPRIL TAB

## 2012-06-16 ENCOUNTER — Encounter: Payer: Self-pay | Admitting: Endocrinology

## 2012-06-16 ENCOUNTER — Ambulatory Visit (INDEPENDENT_AMBULATORY_CARE_PROVIDER_SITE_OTHER): Payer: Medicare Other | Admitting: Endocrinology

## 2012-06-16 VITALS — BP 126/80 | HR 65 | Wt 197.0 lb

## 2012-06-16 DIAGNOSIS — E1065 Type 1 diabetes mellitus with hyperglycemia: Secondary | ICD-10-CM

## 2012-06-16 DIAGNOSIS — E1049 Type 1 diabetes mellitus with other diabetic neurological complication: Secondary | ICD-10-CM

## 2012-06-16 DIAGNOSIS — E042 Nontoxic multinodular goiter: Secondary | ICD-10-CM

## 2012-06-16 LAB — TSH: TSH: 0.53 u[IU]/mL (ref 0.35–5.50)

## 2012-06-16 LAB — HEMOGLOBIN A1C: Hgb A1c MFr Bld: 8.2 % — ABNORMAL HIGH (ref 4.6–6.5)

## 2012-06-16 NOTE — Progress Notes (Signed)
Subjective:    Patient ID: Rebecca [name], female    DOB: 15-Jul-1932, 79 y.o.   MRN: [id number]  HPI Patient returns for follow-up of insulin-requiring DM (dx'ed 1969; complicated by CAD and peripheral sensory neuropathy; she has chosen bid insulin, for simplicity and low cost; she has never had severe hypoglycemia or DKA).  she brings a record of her cbg's which i have reviewed today.  It varies from 65-200, but the vast majority are approx 100.  She has mild hypoglycemia at any time of day, but usually in the afternoon, approx once a week.  The hypoglycemia in the afternoon happens when she takes the full 18 units in am, because she is unable to anticipate the activity during the day.   Past Medical History  Diagnosis Date  . CAD (coronary artery disease)   . Hypercholesteremia   . Hypertension   . Sickle-cell trait   . Anemia, unspecified   . Ankle pain, left   . Esophageal reflux   . Multinodular goiter   . Hiatal hernia   . IDDM (insulin dependent diabetes mellitus)     Type II  . Fibrocystic breast disease     Past Surgical History  Procedure Laterality Date  . Coronary angioplasty    . Biopsy thyroid      Dr.Tyann Niehaus-Neg   . Retinal laser procedure    . Foot surgery  2004  . Back surgery  1990  . "female"  64    History   Social History  . Marital Status: Widowed    Spouse Name: N/A    Number of Children: N/A  . Years of Education: N/A   Occupational History  . Not on file.   Social History Main Topics  . Smoking status: Never Smoker   . Smokeless tobacco: Never Used  . Alcohol Use: No  . Drug Use: No  . Sexually Active: Not on file   Other Topics Concern  . Not on file   Social History Narrative   Widowed   Gets regular exercise    Current Outpatient Prescriptions on File Prior to Visit  Medication Sig Dispense Refill  . amLODipine (NORVASC) 10 MG tablet Take 1 tablet by mouth   every day  90 tablet  0  . Ascorbic Acid (VITAMIN C) 500 MG  tablet Take 500 mg by mouth 2 (two) times daily.        Marland Kitchen aspirin 81 MG tablet Take 81 mg by mouth daily.        Marland Kitchen atorvastatin (LIPITOR) 10 MG tablet TAKE ONE TABLET BY MOUTH EVERY DAY  90 tablet  3  . Blood Glucose Monitoring Suppl (ACCU-CHEK NANO SMARTVIEW) W/DEVICE KIT 1 Device by Does not apply route once.  1 kit  0  . calcium carbonate (OS-CAL) 600 MG TABS Take 600 mg by mouth 2 (two) times daily with a meal.        . Cholecalciferol (VITAMIN D3) 5000 UNITS CAPS Take 5,000 Units by mouth daily.        Marland Kitchen Cod Liver Oil 1000 MG CAPS Take 1,000 mg by mouth daily. During the winter      . glucose blood (ACCU-CHEK SMARTVIEW) test strip 1 each by Other route 2 (two) times daily. Use as instructed  180 each  3  . hydrochlorothiazide (MICROZIDE) 12.5 MG capsule Take 1 capsule (12.5 mg total) by mouth daily as needed.  90 capsule  1  . insulin NPH-insulin regular (NOVOLIN 70/30) (70-30) 100 UNIT/ML  injection Inject subcutaneously 18 units with breakfast and 6 units with the evening meal      . isosorbide mononitrate (IMDUR) 30 MG 24 hr tablet Take 1 tablet by mouth  every day  90 tablet  1  . LORazepam (ATIVAN) 0.5 MG tablet TAKE ONE TABLET BY MOUTH EVERY 6 HOURS AS NEEDED FOR ANXIETY  30 tablet  0  . Multiple Vitamin (MULTIVITAMIN) tablet Take 1 tablet by mouth daily.        . nitroGLYCERIN (NITROSTAT) 0.4 MG SL tablet DISSOLVE ONE TABLET UNDER TONGUE AS DIRECTED FOR CHEST PAIN  25 tablet  2  . omeprazole (PRILOSEC) 20 MG capsule Take 1 capsule by mouth  every day in the morning  90 capsule  0  . quinapril (ACCUPRIL) 40 MG tablet Take 1 tablet by mouth  every day  90 tablet  0  . traMADol (ULTRAM) 50 MG tablet TAKE ONE-HALF TABLET BY MOUTH EVERY 6 HOURS AS NEEDED, MAY INCREASE TO TAKING ONE TABLET BY MOUTH EVERY 6 HOURS AS NEEDED FOR PAIN  30 tablet  0  . vitamin E (VITAMIN E) 400 UNIT capsule Take 400 Units by mouth daily.         No current facility-administered medications on file prior to visit.     Allergies  Allergen Reactions  . Codeine     "Makes me drunk"    Family History  Problem Relation Age of Onset  . Hypertension    . Lung cancer    . Heart disease    . Gout Sister   . Gout Brother   . Heart attack Father   . Heart attack Mother   . Gout Brother     BP 126/80  Pulse 65  Wt 197 lb (89.359 kg)  BMI 33.8 kg/m2  SpO2 97%   Review of Systems Denies LOC    Objective:   Physical Exam VITAL SIGNS:  See vs page GENERAL: no distress Pulses: dorsalis pedis intact bilat.   Feet: no deformity.  no ulcer on the feet.  feet are of normal color and temp.  no edema Neuro: sensation is intact to touch on the feet     Assessment & Plan:  DM: apparently overcontrolled.

## 2012-06-16 NOTE — Patient Instructions (Addendum)
blood tests are being requested for you today.  We'll contact you with results.  pending the test results, please continue the same insulin, 18 units with breakfast, and 6 with the evening meal.  If you are going to be active during the day, take only 10 units that morning. If you are unable to anticipate the activity, make sure to eat a snack before it. check your blood sugar 2 times a day.  vary the time of day when you check, between before the 3 meals, and at bedtime.  also check if you have symptoms of your blood sugar being too high or too low.  please keep a record of the readings and bring it to your next appointment here.  please call us sooner if you are having low blood sugar episodes.   Please make a follow-up appointment in 3 months.

## 2012-06-19 ENCOUNTER — Other Ambulatory Visit: Payer: Self-pay | Admitting: Internal Medicine

## 2012-06-24 ENCOUNTER — Other Ambulatory Visit: Payer: Self-pay | Admitting: *Deleted

## 2012-06-24 ENCOUNTER — Other Ambulatory Visit: Payer: Self-pay | Admitting: Endocrinology

## 2012-06-24 MED ORDER — INSULIN NPH ISOPHANE & REGULAR (70-30) 100 UNIT/ML ~~LOC~~ SUSP: SUBCUTANEOUS | Status: DC

## 2012-07-01 ENCOUNTER — Ambulatory Visit (INDEPENDENT_AMBULATORY_CARE_PROVIDER_SITE_OTHER): Payer: Medicare Other | Admitting: Internal Medicine

## 2012-07-01 ENCOUNTER — Encounter: Payer: Self-pay | Admitting: Internal Medicine

## 2012-07-01 VITALS — BP 128/74 | HR 70 | Temp 98.2°F | Wt 199.0 lb

## 2012-07-01 DIAGNOSIS — M2550 Pain in unspecified joint: Secondary | ICD-10-CM

## 2012-07-01 DIAGNOSIS — M7541 Impingement syndrome of right shoulder: Secondary | ICD-10-CM

## 2012-07-01 DIAGNOSIS — M25819 Other specified joint disorders, unspecified shoulder: Secondary | ICD-10-CM

## 2012-07-01 LAB — URIC ACID: Uric Acid, Serum: 5.8 mg/dL (ref 2.4–7.0)

## 2012-07-01 MED ORDER — NITROGLYCERIN 0.4 MG SL SUBL: SUBLINGUAL_TABLET | SUBLINGUAL | Status: DC

## 2012-07-01 NOTE — Progress Notes (Signed)
  Subjective:    Patient ID: Rebecca [name], female    DOB: [birth year], 79 y.o.   MRN: [id number]  HPI Symptoms began 06/25/12 a sharp pain from the right shoulder radiating to the elbow. Initially this was constant but did respond somewhat to a muscle relaxant.  She denies any specific injury or trigger  There was some associated edema of the right hand.  She questioned a diagnosis of gout. She has no past history of gout. She is on hydrochlorothiazide.    Review of Systems She denies numbness, tingling,or  Weakness; but she is unable to lift her right arm because of pain in the shoulder.     Objective:   Physical Exam  She appears healthy and well-nourished in no acute distress. She is uncomfortable with range of motion testing of the right shoulder.  She has no lymphadenopathy about the neck or axilla  Deep tendon reflexes, strength, and tone are normal. There is no muscle weakness in the right hand. There is no pain with range of motion of the elbow. She has exquisite pain with any rotation or elevation of the right shoulder.   She has some crepitus in fusiform changes of the knees. The hands reveal no significant arthritic change. There is a minor flexion contraction of the right fifth finger. Range of motion of the neck is normal.  No rash or significant skin lesions are present.   Trace edema is noted at the ankles.          Assessment & Plan:  #1 right shoulder pain; probable rotator cuff tear; orthopedic referral indicated  #2 intermittent hydrochlorothiazide therapy; uric acid will be checked.

## 2012-07-01 NOTE — Patient Instructions (Addendum)
Use an anti-inflammatory cream such as Aspercreme or Zostrix cream twice a day to joints as needed. In lieu of this warm moist compresses or  hot water bottle can be used. Do not apply ice .Arthritis strength Tylenol is the safest medication to take at bedtime as it has no gastric or cardiovascular risk.

## 2012-09-04 ENCOUNTER — Other Ambulatory Visit: Payer: Self-pay | Admitting: Internal Medicine

## 2012-09-04 NOTE — Telephone Encounter (Signed)
Refill done per protocol.  

## 2012-09-22 ENCOUNTER — Ambulatory Visit: Payer: Medicare Other | Admitting: Endocrinology

## 2012-09-24 ENCOUNTER — Other Ambulatory Visit: Payer: Self-pay | Admitting: Internal Medicine

## 2012-09-24 ENCOUNTER — Telehealth: Payer: Self-pay | Admitting: *Deleted

## 2012-09-24 DIAGNOSIS — T887XXA Unspecified adverse effect of drug or medicament, initial encounter: Secondary | ICD-10-CM

## 2012-09-24 DIAGNOSIS — E785 Hyperlipidemia, unspecified: Secondary | ICD-10-CM

## 2012-09-24 NOTE — Telephone Encounter (Signed)
Rebecca [name] called back and said she needed her Diabetic shoes, she called her insurance company who told her to have you call this number [phone] in order to get those approved.

## 2012-09-24 NOTE — Telephone Encounter (Signed)
Noted pt is aware and will discuss with you on her next appt.

## 2012-09-24 NOTE — Telephone Encounter (Signed)
No, it was declined in 2013.  It was approved by another dr in 2012, but medicare is getting more stringent now.

## 2012-09-25 NOTE — Telephone Encounter (Signed)
Letter mailed to patient, future orders placed

## 2012-09-26 ENCOUNTER — Telehealth: Payer: Self-pay | Admitting: *Deleted

## 2012-09-26 MED ORDER — TRAMADOL HCL 50 MG PO TABS: ORAL_TABLET | ORAL | Status: DC

## 2012-09-26 NOTE — Telephone Encounter (Signed)
OK X1 

## 2012-09-26 NOTE — Telephone Encounter (Signed)
Rx sent 

## 2012-09-26 NOTE — Telephone Encounter (Signed)
Pharmacy is requesting a refill for ultram 50 mg. Last ov 07/01/12 last date filled 05/07/12  30 ct 0 refills. Please advise.

## 2012-10-02 ENCOUNTER — Telehealth: Payer: Self-pay

## 2012-10-02 DIAGNOSIS — T887XXA Unspecified adverse effect of drug or medicament, initial encounter: Secondary | ICD-10-CM

## 2012-10-02 DIAGNOSIS — E785 Hyperlipidemia, unspecified: Secondary | ICD-10-CM

## 2012-10-02 NOTE — Telephone Encounter (Signed)
30 pills in 6 days ; would need OV for refill

## 2012-10-02 NOTE — Telephone Encounter (Signed)
Last seen 07/01/12 and filled 09/26/12 #30. Please advise      KP

## 2012-10-03 NOTE — Telephone Encounter (Signed)
Per patient she just picked up rx this week from 6 days ago. Patient did not request medication

## 2012-10-07 ENCOUNTER — Other Ambulatory Visit (INDEPENDENT_AMBULATORY_CARE_PROVIDER_SITE_OTHER): Payer: Medicare Other

## 2012-10-07 DIAGNOSIS — T887XXA Unspecified adverse effect of drug or medicament, initial encounter: Secondary | ICD-10-CM

## 2012-10-07 DIAGNOSIS — E785 Hyperlipidemia, unspecified: Secondary | ICD-10-CM

## 2012-10-07 LAB — LIPID PANEL
Cholesterol: 186 mg/dL (ref 0–200)
HDL: 81.5 mg/dL (ref >39.00)
LDL Cholesterol: 91 mg/dL (ref 0–99)
Total CHOL/HDL Ratio: 2
Triglycerides: 67 mg/dL (ref 0.0–149.0)
VLDL: 13.4 mg/dL (ref 0.0–40.0)

## 2012-10-07 LAB — HEPATIC FUNCTION PANEL
ALT: 26 U/L (ref 0–35)
AST: 26 U/L (ref 0–37)
Albumin: 3.7 g/dL (ref 3.5–5.2)
Alkaline Phosphatase: 55 U/L (ref 39–117)
Bilirubin, Direct: 0.1 mg/dL (ref 0.0–0.3)
Total Bilirubin: 0.5 mg/dL (ref 0.3–1.2)
Total Protein: 7 g/dL (ref 6.0–8.3)

## 2012-10-20 ENCOUNTER — Ambulatory Visit (INDEPENDENT_AMBULATORY_CARE_PROVIDER_SITE_OTHER): Payer: Medicare Other | Admitting: Endocrinology

## 2012-10-20 VITALS — BP 128/80 | HR 80 | Ht 62.0 in | Wt 198.0 lb

## 2012-10-20 DIAGNOSIS — E1065 Type 1 diabetes mellitus with hyperglycemia: Secondary | ICD-10-CM

## 2012-10-20 DIAGNOSIS — E1049 Type 1 diabetes mellitus with other diabetic neurological complication: Secondary | ICD-10-CM

## 2012-10-20 DIAGNOSIS — E109 Type 1 diabetes mellitus without complications: Secondary | ICD-10-CM | POA: Insufficient documentation

## 2012-10-20 LAB — HEMOGLOBIN A1C: Hgb A1c MFr Bld: 7.8 % — ABNORMAL HIGH (ref 4.6–6.5)

## 2012-10-20 NOTE — Patient Instructions (Addendum)
blood tests are being requested for you today.  We'll contact you with results.  pending the test results, please take 15 units with breakfast, and 6 with the evening meal.  If you are going to be active during the day, take only 10 units that morning. If you are unable to anticipate the activity, make sure to eat a snack before it. check your blood sugar 2 times a day.  vary the time of day when you check, between before the 3 meals, and at bedtime.  also check if you have symptoms of your blood sugar being too high or too low.  please keep a record of the readings and bring it to your next appointment here.  please call us sooner if you are having low blood sugar episodes.   Please make a follow-up appointment in 3 months.

## 2012-10-20 NOTE — Progress Notes (Signed)
Subjective:    Patient ID: Rebecca [name], female    DOB: [birth year], 79 y.o.   MRN: [id number]  HPI Patient returns for follow-up of insulin-requiring DM (dx'ed 1969; she has moderate neuropathy of the lower extremities, and associated CAD; she has chosen bid insulin, for simplicity and low cost; she has never had severe hypoglycemia or DKA).  she brings a record of her cbg's which i have reviewed today.  It varies from 62-200, but the vast majority are approx 100.  She has had to reduce her insulin due to frequent mild hypoglycemia, at any time of day.  However, this is usually with exertion.   Past Medical History  Diagnosis Date  . CAD (coronary artery disease)   . Hypercholesteremia   . Hypertension   . Sickle-cell trait   . Anemia, unspecified   . Ankle pain, left   . Esophageal reflux   . Multinodular goiter   . Hiatal hernia   . IDDM (insulin dependent diabetes mellitus)     Type II  . Fibrocystic breast disease     Past Surgical History  Procedure Laterality Date  . Coronary angioplasty    . Biopsy thyroid      Dr.Aundrea Horace-Neg   . Retinal laser procedure    . Foot surgery  2004  . Back surgery  1990  . "female"  57    History   Social History  . Marital Status: Widowed    Spouse Name: N/A    Number of Children: N/A  . Years of Education: N/A   Occupational History  . Not on file.   Social History Main Topics  . Smoking status: Never Smoker   . Smokeless tobacco: Never Used  . Alcohol Use: No  . Drug Use: No  . Sexual Activity: Not on file   Other Topics Concern  . Not on file   Social History Narrative   Widowed   Gets regular exercise    Current Outpatient Prescriptions on File Prior to Visit  Medication Sig Dispense Refill  . amLODipine (NORVASC) 10 MG tablet Take 1 tablet by mouth    every day  90 tablet  1  . Ascorbic Acid (VITAMIN C) 500 MG tablet Take 500 mg by mouth 2 (two) times daily.        Marland Kitchen aspirin 81 MG tablet Take 81 mg by  mouth daily.        Marland Kitchen atorvastatin (LIPITOR) 10 MG tablet LABS OVERDUE, 1 by mouth daily  90 tablet  0  . Blood Glucose Monitoring Suppl (ACCU-CHEK NANO SMARTVIEW) W/DEVICE KIT 1 Device by Does not apply route once.  1 kit  0  . calcium carbonate (OS-CAL) 600 MG TABS Take 600 mg by mouth daily.       . Cholecalciferol (VITAMIN D3) 5000 UNITS CAPS Take 5,000 Units by mouth daily.        Marland Kitchen Cod Liver Oil 1000 MG CAPS Take 1,000 mg by mouth daily. During the winter      . glucose blood (ACCU-CHEK SMARTVIEW) test strip 1 each by Other route 2 (two) times daily. Use as instructed  180 each  3  . hydrochlorothiazide (MICROZIDE) 12.5 MG capsule Take 1 capsule (12.5 mg total) by mouth daily as needed.  90 capsule  1  . isosorbide mononitrate (IMDUR) 30 MG 24 hr tablet Take 1 tablet by mouth  every day  90 tablet  1  . LORazepam (ATIVAN) 0.5 MG tablet TAKE ONE TABLET BY  MOUTH EVERY 6 HOURS AS NEEDED FOR ANXIETY  30 tablet  0  . Multiple Vitamin (MULTIVITAMIN) tablet Take 1 tablet by mouth daily.        . nitroGLYCERIN (NITROSTAT) 0.4 MG SL tablet DISSOLVE ONE TABLET UNDER TONGUE AS DIRECTED FOR CHEST PAIN  25 tablet  5  . omeprazole (PRILOSEC) 20 MG capsule Take 1 capsule by mouth  every morning  90 capsule  1  . quinapril (ACCUPRIL) 40 MG tablet Take 1 tablet by mouth   every day  90 tablet  1  . traMADol (ULTRAM) 50 MG tablet TAKE ONE-HALF TABLET BY MOUTH EVERY 6 HOURS AS NEEDED, MAY INCREASE TO TAKING ONE TABLET BY MOUTH EVERY 6 HOURS AS NEEDED FOR PAIN  30 tablet  0  . vitamin E (VITAMIN E) 400 UNIT capsule Take 400 Units by mouth daily.         No current facility-administered medications on file prior to visit.    Allergies  Allergen Reactions  . Codeine     "Makes me drunk"    Family History  Problem Relation Age of Onset  . Hypertension    . Lung cancer    . Heart disease    . Gout Sister   . Gout Brother   . Heart attack Father   . Heart attack Mother   . Gout Brother     BP  128/80  Pulse 80  Ht 5\' 2"  (1.575 m)  Wt 198 lb (89.812 kg)  BMI 36.21 kg/m2  SpO2 98%  Review of Systems Denies LOC and weight change    Objective:   Physical Exam VITAL SIGNS:  See vs page GENERAL: no distress.   Lab Results  Component Value Date   HGBA1C 7.8* 10/20/2012      Assessment & Plan:  DM: this is the best control this pt should aim for, given this regimen, which does match insulin to her changing needs throughout the day.  This insulin regimen was chosen from multiple options, for its simplicity.  The benefits of glycemic control must be weighed against the risks of hypoglycemia.

## 2012-10-27 ENCOUNTER — Telehealth: Payer: Self-pay | Admitting: *Deleted

## 2012-10-27 NOTE — Telephone Encounter (Signed)
Patient is requesting a script for meclizine for vertigo. Researched chart did not see where this medication has been prescribed by our office, left message to return our call.

## 2012-10-27 NOTE — Telephone Encounter (Signed)
Patient is calling back. States that Dr. Alwyn Ren prescribed this medication back in 2005 and she kept the bottle. Meclizine 25 mg. Took by mouth 3x a day as needed.

## 2012-10-28 ENCOUNTER — Other Ambulatory Visit: Payer: Self-pay | Admitting: *Deleted

## 2012-10-28 DIAGNOSIS — H8110 Benign paroxysmal vertigo, unspecified ear: Secondary | ICD-10-CM

## 2012-10-28 MED ORDER — MECLIZINE HCL 25 MG PO TABS: ORAL_TABLET | ORAL | Status: DC

## 2012-10-28 MED ORDER — MECLIZINE HCL 25 MG PO TABS: 25.0000 mg | ORAL_TABLET | Freq: Three times a day (TID) | ORAL | Status: DC | PRN

## 2012-10-28 NOTE — Telephone Encounter (Signed)
Rx was filled for Meclizine 25 mg as per Alwyn Ren.  AG cma

## 2012-10-28 NOTE — Telephone Encounter (Signed)
#  15 . 1/2-1 tid prn

## 2012-10-28 NOTE — Telephone Encounter (Signed)
Okay to refill meclizine 25mg  tid prn? Previous script has expired, written in 2005 evidently.

## 2012-10-28 NOTE — Telephone Encounter (Signed)
Rx for meclizine printed and faxed to [facility] on [address]

## 2012-12-02 ENCOUNTER — Other Ambulatory Visit: Payer: Self-pay | Admitting: Internal Medicine

## 2012-12-03 NOTE — Telephone Encounter (Signed)
Med filled.  

## 2013-01-14 ENCOUNTER — Other Ambulatory Visit: Payer: Self-pay | Admitting: Endocrinology

## 2013-01-14 ENCOUNTER — Other Ambulatory Visit: Payer: Self-pay | Admitting: Internal Medicine

## 2013-01-15 NOTE — Telephone Encounter (Signed)
Atorvastatin refill sent to pharmacy 

## 2013-01-16 ENCOUNTER — Ambulatory Visit (INDEPENDENT_AMBULATORY_CARE_PROVIDER_SITE_OTHER): Payer: Medicare Other | Admitting: Endocrinology

## 2013-01-16 ENCOUNTER — Encounter: Payer: Self-pay | Admitting: Endocrinology

## 2013-01-16 VITALS — BP 130/64 | HR 59 | Temp 97.9°F | Ht 62.0 in | Wt 199.8 lb

## 2013-01-16 DIAGNOSIS — E1049 Type 1 diabetes mellitus with other diabetic neurological complication: Secondary | ICD-10-CM

## 2013-01-16 DIAGNOSIS — E1065 Type 1 diabetes mellitus with hyperglycemia: Secondary | ICD-10-CM

## 2013-01-16 DIAGNOSIS — Z23 Encounter for immunization: Secondary | ICD-10-CM

## 2013-01-16 LAB — HEMOGLOBIN A1C: Hgb A1c MFr Bld: 7.8 % — ABNORMAL HIGH (ref 4.6–6.5)

## 2013-01-16 NOTE — Patient Instructions (Signed)
blood tests are being requested for you today.  We'll contact you with results.  pending the test results, please take 15 units with breakfast, and 6 with the evening meal.  If you are going to be active during the day, take only 10 units that morning. If you are unable to anticipate the activity, make sure to eat a snack before it. check your blood sugar 2 times a day.  vary the time of day when you check, between before the 3 meals, and at bedtime.  also check if you have symptoms of your blood sugar being too high or too low.  please keep a record of the readings and bring it to your next appointment here.  please call us sooner if you are having low blood sugar episodes.   Please make a follow-up appointment in 3 months.

## 2013-01-16 NOTE — Progress Notes (Signed)
Subjective:    Patient ID: Rebecca [name], female    DOB: Jun 27, 1932, 79 y.o.   MRN: [id number]  HPI Patient returns for follow-up of insulin-requiring DM (dx'ed 1969, when she presented with polyuria; she has moderate neuropathy of the lower extremities, and associated CAD; she has chosen bid insulin, for simplicity and low cost; she has never had severe hypoglycemia or DKA).  she brings a record of her cbg's which i have reviewed today.  It varies from 68-209, but the vast majority are approx 100.  She has not been reducing the am insulin for daytime activity, as advised.  pt states she feels well in general. Past Medical History  Diagnosis Date  . CAD (coronary artery disease)   . Hypercholesteremia   . Hypertension   . Sickle-cell trait   . Anemia, unspecified   . Ankle pain, left   . Esophageal reflux   . Multinodular goiter   . Hiatal hernia   . IDDM (insulin dependent diabetes mellitus)     Type II  . Fibrocystic breast disease     Past Surgical History  Procedure Laterality Date  . Coronary angioplasty    . Biopsy thyroid      Dr.Malaika Arnall-Neg   . Retinal laser procedure    . Foot surgery  2004  . Back surgery  1990  . "female"  51    History   Social History  . Marital Status: Widowed    Spouse Name: N/A    Number of Children: N/A  . Years of Education: N/A   Occupational History  . Not on file.   Social History Main Topics  . Smoking status: Never Smoker   . Smokeless tobacco: Never Used  . Alcohol Use: No  . Drug Use: No  . Sexual Activity: Not on file   Other Topics Concern  . Not on file   Social History Narrative   Widowed   Gets regular exercise    Current Outpatient Prescriptions on File Prior to Visit  Medication Sig Dispense Refill  . ACCU-CHEK FASTCLIX LANCETS MISC Use to test two times daily  204 each  11  . ACCU-CHEK SMARTVIEW test strip Use 1 two times daily as  instructed  200 each  11  . amLODipine (NORVASC) 10 MG tablet  Take 1 tablet by mouth    every day  90 tablet  1  . Ascorbic Acid (VITAMIN C) 500 MG tablet Take 500 mg by mouth 2 (two) times daily.        Marland Kitchen aspirin 81 MG tablet Take 81 mg by mouth daily.        Marland Kitchen atorvastatin (LIPITOR) 10 MG tablet Take 1 tablet by mouth  daily  90 tablet  0  . Blood Glucose Monitoring Suppl (ACCU-CHEK NANO SMARTVIEW) W/DEVICE KIT 1 Device by Does not apply route once.  1 kit  0  . calcium carbonate (OS-CAL) 600 MG TABS Take 600 mg by mouth daily.       . Cholecalciferol (VITAMIN D3) 5000 UNITS CAPS Take 5,000 Units by mouth daily.        Marland Kitchen Cod Liver Oil 1000 MG CAPS Take 1,000 mg by mouth daily. During the winter      . hydrochlorothiazide (MICROZIDE) 12.5 MG capsule Take 1 capsule (12.5 mg total) by mouth daily as needed.  90 capsule  1  . insulin NPH-regular (NOVOLIN 70/30 RELION) (70-30) 100 UNIT/ML injection       . isosorbide mononitrate (IMDUR) 30  MG 24 hr tablet Take 1 tablet by mouth  every day  90 tablet  1  . LORazepam (ATIVAN) 0.5 MG tablet TAKE ONE TABLET BY MOUTH EVERY 6 HOURS AS NEEDED FOR ANXIETY  30 tablet  0  . meclizine (ANTIVERT) 25 MG tablet Take 1/2 to 1 tab three times a day as needed  15 tablet  0  . Multiple Vitamin (MULTIVITAMIN) tablet Take 1 tablet by mouth daily.        . nitroGLYCERIN (NITROSTAT) 0.4 MG SL tablet DISSOLVE ONE TABLET UNDER TONGUE AS DIRECTED FOR CHEST PAIN  25 tablet  5  . omeprazole (PRILOSEC) 20 MG capsule Take 1 capsule by mouth   every morning  90 capsule  0  . quinapril (ACCUPRIL) 40 MG tablet Take 1 tablet by mouth   every day  90 tablet  1  . traMADol (ULTRAM) 50 MG tablet TAKE ONE-HALF TABLET BY MOUTH EVERY 6 HOURS AS NEEDED, MAY INCREASE TO TAKING ONE TABLET BY MOUTH EVERY 6 HOURS AS NEEDED FOR PAIN  30 tablet  0  . vitamin E (VITAMIN E) 400 UNIT capsule Take 400 Units by mouth daily.         No current facility-administered medications on file prior to visit.    Allergies  Allergen Reactions  . Codeine     "Makes  me drunk"    Family History  Problem Relation Age of Onset  . Hypertension    . Lung cancer    . Heart disease    . Gout Sister   . Gout Brother   . Heart attack Father   . Heart attack Mother   . Gout Brother     BP 130/64  Pulse 59  Temp(Src) 97.9 F (36.6 C) (Oral)  Ht 5\' 2"  (1.575 m)  Wt 199 lb 12.8 oz (90.629 kg)  BMI 36.53 kg/m2  SpO2 98%  Review of Systems Denies LOC and weight change    Objective:   Physical Exam VITAL SIGNS:  See vs page GENERAL: no distress SKIN:  Insulin injection sites at the anterior abdomen are normal   Lab Results  Component Value Date   HGBA1C 7.8* 01/16/2013      Assessment & Plan:  DM: this is the best control this pt should aim for, given this regimen, which does match insulin to her changing needs throughout the day.  This insulin regimen was chosen from multiple options, for its simplicity.  The benefits of glycemic control must be weighed against the risks of hypoglycemia.   Economic circumstances: these limit the rx of DM: she may be better managed with 50/50, but she cannot afford it.

## 2013-01-20 ENCOUNTER — Ambulatory Visit: Payer: Medicare Other | Admitting: Endocrinology

## 2013-03-04 ENCOUNTER — Other Ambulatory Visit: Payer: Self-pay | Admitting: Internal Medicine

## 2013-03-06 NOTE — Telephone Encounter (Signed)
Omeprazole, Amlodipine, and Quinapril refilled per protocol. OV due. JG//CMA

## 2013-03-10 ENCOUNTER — Other Ambulatory Visit: Payer: Self-pay | Admitting: *Deleted

## 2013-03-10 ENCOUNTER — Telehealth: Payer: Self-pay | Admitting: *Deleted

## 2013-03-10 MED ORDER — TRAMADOL HCL 50 MG PO TABS: ORAL_TABLET | ORAL | Status: DC

## 2013-03-10 NOTE — Telephone Encounter (Signed)
traMADol (ULTRAM) 50 MG tablet  Last OV: 07/01/2012  Last refill: 09/26/2012 #30, 0 refills Contract on file

## 2013-03-10 NOTE — Telephone Encounter (Signed)
traMADol (ULTRAM) 50 MG tablet Last OV: 07/01/2012 Last refill:

## 2013-03-10 NOTE — Telephone Encounter (Signed)
OK X1 

## 2013-03-11 NOTE — Telephone Encounter (Signed)
Tramadol script faxed to pharmacy. JG//CMA 

## 2013-03-20 ENCOUNTER — Other Ambulatory Visit: Payer: Self-pay | Admitting: Internal Medicine

## 2013-03-23 NOTE — Telephone Encounter (Signed)
Imdur and Atorvastatin refilled per protocol. JG//CMA

## 2013-04-01 ENCOUNTER — Ambulatory Visit (INDEPENDENT_AMBULATORY_CARE_PROVIDER_SITE_OTHER): Payer: Medicare Other | Admitting: Internal Medicine

## 2013-04-01 ENCOUNTER — Encounter: Payer: Self-pay | Admitting: Internal Medicine

## 2013-04-01 VITALS — BP 148/62 | HR 69 | Temp 98.0°F | Wt 201.4 lb

## 2013-04-01 DIAGNOSIS — M76829 Posterior tibial tendinitis, unspecified leg: Secondary | ICD-10-CM

## 2013-04-01 DIAGNOSIS — R269 Unspecified abnormalities of gait and mobility: Secondary | ICD-10-CM

## 2013-04-01 DIAGNOSIS — I10 Essential (primary) hypertension: Secondary | ICD-10-CM

## 2013-04-01 DIAGNOSIS — M6789 Other specified disorders of synovium and tendon, multiple sites: Secondary | ICD-10-CM

## 2013-04-01 DIAGNOSIS — E1149 Type 2 diabetes mellitus with other diabetic neurological complication: Secondary | ICD-10-CM

## 2013-04-01 DIAGNOSIS — E119 Type 2 diabetes mellitus without complications: Secondary | ICD-10-CM | POA: Insufficient documentation

## 2013-04-01 DIAGNOSIS — E1122 Type 2 diabetes mellitus with diabetic chronic kidney disease: Secondary | ICD-10-CM | POA: Insufficient documentation

## 2013-04-01 NOTE — Progress Notes (Signed)
   Subjective:    Patient ID: Rebecca HashimotoParthenia [name], female    DOB: Dec 30, 1932, 80 y.o.   MRN: [id number]  HPI   She is here to be evaluated for diabetic footwear. Although she sees an endocrinologist, Dr. Everardo AllEllison, and orthopedist, Dr. Victorino DikeHewitt, assessment by a treating physician was requested to authorize the diabetic footwear.  She's been diagnosed as having posterior tibial  tendon dysfunction. Despite a brace she continues to have pain in the left foot. The brace has decreased severity of the pain.  Prednisone had been prescribed but this drove her sugars up over 300. She's also been prescribed muscle relaxants.  The pain is related to inversion of the left foot which has caused direct pressure on a large osteoid deformity @ the medial aspect of the L foot. Additionally she's has marked pes planus  She describes intermittent tingling in the left foot. Due to the foot pain her gait has been affected and she is now having hip dysfunction.  Her last A1c was 7.8% on 11 09/15/12. And this would correlate to an average sugar of 200 and increased long-term risk of 56%.    Review of Systems   There has been no associated edema of the feet. He  She has no nonhealing sores no skin lesions of the feet.  Her blood pressure has averaged 130/62 at home. She's been compliant with her antihypertensive medications.      Objective:   Physical Exam   She appears well-nourished; weight excess is present.  Decreased sensation is noted over the ventral and dorsal surfaces of the left foot at the base.   There is inversion of the left foot.  A 3 x 3 cm osteoid deformity is noted at the posterior medial aspect of the left foot anterior to the malleolus.  Nail health is good.  No ischemic skin lesions are present  Pedal pulses are present and equal.        Assessment & Plan:  See Current Assessment & Plan in Problem List under specific Diagnosis

## 2013-04-01 NOTE — Assessment & Plan Note (Signed)
Based on neurologic deficits, tendon dysfunction, severe pes planus, and osteoid deformity of the left foot she qualifies for diabetic footwear.

## 2013-04-01 NOTE — Patient Instructions (Addendum)
I will complete the paperwork and send copies of the notes with a order for diabetic shoes. Please followup with Dr. Everardo AllEllison, endocrinologist and with Dr. Victorino DikeHewitt, orthopedist as scheduled.

## 2013-04-01 NOTE — Progress Notes (Signed)
Pre visit review using our clinic review tool, if applicable. No additional management support is needed unless otherwise documented below in the visit note. 

## 2013-04-01 NOTE — Assessment & Plan Note (Signed)
Her orthopedist feels diabetic shoes are medically necessary; her exam supports this

## 2013-04-01 NOTE — Assessment & Plan Note (Signed)
This complex is associated with severe pain with ambulation. This has only been partially controlled with a brace prescribed by the orthopedist.

## 2013-04-03 ENCOUNTER — Telehealth: Payer: Self-pay

## 2013-04-03 NOTE — Assessment & Plan Note (Signed)
Home readings indicate adequate control

## 2013-04-03 NOTE — Telephone Encounter (Signed)
Relevant patient education mailed to patient.  

## 2013-04-10 ENCOUNTER — Other Ambulatory Visit: Payer: Self-pay | Admitting: Internal Medicine

## 2013-04-10 NOTE — Telephone Encounter (Signed)
Script faxed to pharmacy. JG//CMA 

## 2013-04-10 NOTE — Telephone Encounter (Signed)
Tramadol 50 mg Last refill: 03/10/13 #30, 0 refills Last OV: 04/01/13 Contract on file

## 2013-04-10 NOTE — Telephone Encounter (Signed)
OK X1 

## 2013-04-20 ENCOUNTER — Encounter: Payer: Self-pay | Admitting: Endocrinology

## 2013-04-20 ENCOUNTER — Ambulatory Visit (INDEPENDENT_AMBULATORY_CARE_PROVIDER_SITE_OTHER): Payer: Medicare Other | Admitting: Endocrinology

## 2013-04-20 VITALS — BP 122/50 | HR 66 | Temp 97.8°F | Ht 62.0 in | Wt 202.0 lb

## 2013-04-20 DIAGNOSIS — E1149 Type 2 diabetes mellitus with other diabetic neurological complication: Secondary | ICD-10-CM

## 2013-04-20 LAB — HEMOGLOBIN A1C
Hgb A1c MFr Bld: 6 % — ABNORMAL HIGH (ref <5.7)
Mean Plasma Glucose: 126 mg/dL — ABNORMAL HIGH (ref <117)

## 2013-04-20 NOTE — Patient Instructions (Signed)
blood tests are being requested for you today.  We'll contact you with results.  pending the test results, please reduce the insulin to 15 units with breakfast, and 5 with the evening meal.  If you are going to be active during the day, take only 10 units that morning. If you are unable to anticipate the activity, make sure to eat a snack before it. check your blood sugar 2 times a day.  vary the time of day when you check, between before the 3 meals, and at bedtime.  also check if you have symptoms of your blood sugar being too high or too low.  please keep a record of the readings and bring it to your next appointment here.  please call us sooner if you are having low blood sugar episodes.   Please make a follow-up appointment in 3 months.

## 2013-04-20 NOTE — Progress Notes (Signed)
Subjective:    Patient ID: Rebecca [name], female    DOB: [birth year], 80 y.o.   MRN: [id number]  HPI Patient returns for follow-up of insulin-requiring DM (dx'ed 1969, when she presented with polyuria; she has moderate neuropathy of the lower extremities, and associated CAD; she has chosen bid 70/30 insulin, for simplicity and low cost; she has never had severe hypoglycemia or DKA).  she brings a record of her cbg's which i have reviewed today.  It varies from 65-200, but the vast majority are approx 100.   Past Medical History  Diagnosis Date  . CAD (coronary artery disease)   . Hypercholesteremia   . Hypertension   . Sickle-cell trait   . Anemia, unspecified   . Ankle pain, left   . Esophageal reflux   . Multinodular goiter   . Hiatal hernia   . IDDM (insulin dependent diabetes mellitus)     Type II  . Fibrocystic breast disease     Past Surgical History  Procedure Laterality Date  . Coronary angioplasty    . Biopsy thyroid      Dr.Devone Tousley-Neg   . Retinal laser procedure    . Foot surgery  2004  . Back surgery  1990  . "female"  21    History   Social History  . Marital Status: Widowed    Spouse Name: N/A    Number of Children: N/A  . Years of Education: N/A   Occupational History  . Not on file.   Social History Main Topics  . Smoking status: Never Smoker   . Smokeless tobacco: Never Used  . Alcohol Use: No  . Drug Use: No  . Sexual Activity: Not on file   Other Topics Concern  . Not on file   Social History Narrative   Widowed   Gets regular exercise    Current Outpatient Prescriptions on File Prior to Visit  Medication Sig Dispense Refill  . ACCU-CHEK FASTCLIX LANCETS MISC Use to test two times daily  204 each  11  . ACCU-CHEK SMARTVIEW test strip Use 1 two times daily as  instructed  200 each  11  . amLODipine (NORVASC) 10 MG tablet Take 1 tablet by mouth     every day  90 tablet  0  . Ascorbic Acid (VITAMIN C) 500 MG tablet Take 500 mg  by mouth 2 (two) times daily.        Marland Kitchen aspirin 81 MG tablet Take 81 mg by mouth daily.        Marland Kitchen atorvastatin (LIPITOR) 10 MG tablet Take 1 tablet by mouth  daily  90 tablet  0  . Blood Glucose Monitoring Suppl (ACCU-CHEK NANO SMARTVIEW) W/DEVICE KIT 1 Device by Does not apply route once.  1 kit  0  . calcium carbonate (OS-CAL) 600 MG TABS Take 600 mg by mouth daily.       . Cholecalciferol (VITAMIN D3) 5000 UNITS CAPS Take 5,000 Units by mouth daily.        Marland Kitchen Cod Liver Oil 1000 MG CAPS Take 1,000 mg by mouth daily. During the winter      . hydrochlorothiazide (MICROZIDE) 12.5 MG capsule Take 1 capsule (12.5 mg total) by mouth daily as needed.  90 capsule  1  . insulin NPH-regular (NOVOLIN 70/30 RELION) (70-30) 100 UNIT/ML injection 15 units with breakfast, and 5 units with the evening meal      . isosorbide mononitrate (IMDUR) 30 MG 24 hr tablet Take 1 tablet  by mouth  every day  90 tablet  0  . LORazepam (ATIVAN) 0.5 MG tablet TAKE ONE TABLET BY MOUTH EVERY 6 HOURS AS NEEDED FOR ANXIETY  30 tablet  0  . meclizine (ANTIVERT) 25 MG tablet Take 1/2 to 1 tab three times a day as needed  15 tablet  0  . Multiple Vitamin (MULTIVITAMIN) tablet Take 1 tablet by mouth daily.        . nitroGLYCERIN (NITROSTAT) 0.4 MG SL tablet DISSOLVE ONE TABLET UNDER TONGUE AS DIRECTED FOR CHEST PAIN  25 tablet  5  . omeprazole (PRILOSEC) 20 MG capsule Take 1 capsule by mouth    every morning  90 capsule  0  . quinapril (ACCUPRIL) 40 MG tablet Take 1 tablet by mouth    every day  90 tablet  0  . traMADol (ULTRAM) 50 MG tablet TAKE ONE-HALF TABLET BY MOUTH EVERY 6 HOURS AS NEEDED  30 tablet  0  . vitamin E (VITAMIN E) 400 UNIT capsule Take 400 Units by mouth daily.         No current facility-administered medications on file prior to visit.    Allergies  Allergen Reactions  . Codeine     "Makes me drunk"    Family History  Problem Relation Age of Onset  . Hypertension    . Lung cancer    . Heart disease    .  Gout Sister   . Gout Brother   . Heart attack Father   . Heart attack Mother   . Gout Brother     BP 122/50  Pulse 66  Temp(Src) 97.8 F (36.6 C) (Oral)  Ht '5\' 2"'  (1.575 m)  Wt 202 lb (91.627 kg)  BMI 36.94 kg/m2  SpO2 95%  Review of Systems Denies LOC and weight change    Objective:   Physical Exam VITAL SIGNS:  See vs page GENERAL: no distress  Lab Results  Component Value Date   HGBA1C 7.8* 01/16/2013      Assessment & Plan:  DM: slightly overcontrolled. This insulin regimen was chosen from multiple options, for its simplicity.  The benefits of glycemic control must be weighed against the risks of hypoglycemia.   Economic circumstances: these limit the rx of DM. CAD: in this context, she should avoid hypoglycemia.

## 2013-04-21 LAB — MICROALBUMIN / CREATININE URINE RATIO
Creatinine, Urine: 54.4 mg/dL
Microalb Creat Ratio: 41.2 mg/g — ABNORMAL HIGH (ref 0.0–30.0)
Microalb, Ur: 2.24 mg/dL — ABNORMAL HIGH (ref 0.00–1.89)

## 2013-06-09 ENCOUNTER — Encounter: Payer: Self-pay | Admitting: Cardiology

## 2013-08-05 ENCOUNTER — Ambulatory Visit (INDEPENDENT_AMBULATORY_CARE_PROVIDER_SITE_OTHER): Payer: Medicare HMO | Admitting: Cardiology

## 2013-08-05 ENCOUNTER — Encounter: Payer: Self-pay | Admitting: Cardiology

## 2013-08-05 VITALS — BP 122/64 | HR 54 | Ht 62.0 in | Wt 187.0 lb

## 2013-08-05 DIAGNOSIS — I2510 Atherosclerotic heart disease of native coronary artery without angina pectoris: Secondary | ICD-10-CM | POA: Diagnosis not present

## 2013-08-05 DIAGNOSIS — R609 Edema, unspecified: Secondary | ICD-10-CM

## 2013-08-05 DIAGNOSIS — E7800 Pure hypercholesterolemia, unspecified: Secondary | ICD-10-CM | POA: Diagnosis not present

## 2013-08-05 DIAGNOSIS — I10 Essential (primary) hypertension: Secondary | ICD-10-CM

## 2013-08-05 MED ORDER — NITROGLYCERIN 0.4 MG SL SUBL: SUBLINGUAL_TABLET | SUBLINGUAL | Status: AC

## 2013-08-05 NOTE — Patient Instructions (Signed)
Your physician recommends that you return for a FASTING lipid profile /liver profile--July 2015.  Your physician wants you to follow-up in: 1 year with Dr Shirlee Latch. (May 2016). You will receive a reminder letter in the mail two months in advance. If you don't receive a letter, please call our office to schedule the follow-up appointment.

## 2013-08-05 NOTE — Progress Notes (Signed)
Patient ID: Rebecca [name], female   DOB: 1933-02-05, 80 y.o.   MRN: [id number] PCP: Dr. Jeanie Cooks  80 yo with history of CAD presents for cardiology followup.  She was seen by Dr. Lia Foyer in the past and was seen by me for the first time today.  I reviewed her old records.  She had PCI in the 1990s with 2 stents.  Since that time, no PCI.  Main problem currently is hip, back, and ankle pain.  She uses a cane for balance.  No exertional chest pain.  She is short of breath after walking up a flight of steps.  No dyspnea walking on flat ground.  She still push mows her grass.  No lightheadedness, no tachypalpitations.  Her diabetes is under good control.    Labs (3/14): K 3.9, creatinine 0.9 Labs (7/14); LDL 91, HDL 82  ECG; NSR, left axis deviation, poor R wave progression  PMH: 1. OA: left ankle pain, hip pain 2. CAD: PCI in the 1990s with 2 stents.   3. Hyperlipidemia 4. GERD 5. Sickle cell trait 6. Multinodular goiter 7. Hiatal hernia 8. Type II diabetes 9. H/o back surgery  SH: Widow, nonsmoker, lives in [place], son lives next door.  FH: Parents both had MIs.  ROS: All systems reviewed and negative except as per HPI.   Current Outpatient Prescriptions  Medication Sig Dispense Refill  . ACCU-CHEK FASTCLIX LANCETS MISC Use to test two times daily  204 each  11  . ACCU-CHEK SMARTVIEW test strip Use 1 two times daily as  instructed  200 each  11  . amLODipine (NORVASC) 10 MG tablet Take 1 tablet by mouth     every day  90 tablet  0  . Ascorbic Acid (VITAMIN C) 500 MG tablet Take 500 mg by mouth 2 (two) times daily.        Marland Kitchen aspirin 81 MG tablet Take 81 mg by mouth daily.        . Blood Glucose Monitoring Suppl (ACCU-CHEK NANO SMARTVIEW) W/DEVICE KIT 1 Device by Does not apply route once.  1 kit  0  . calcium carbonate (OS-CAL) 600 MG TABS Take 600 mg by mouth daily.       . Cholecalciferol (VITAMIN D3) 5000 UNITS CAPS Take 5,000 Units by mouth daily.        Marland Kitchen Cod Liver Oil  1000 MG CAPS Take 1,000 mg by mouth daily. During the winter      . hydrochlorothiazide (MICROZIDE) 12.5 MG capsule Take 1 capsule (12.5 mg total) by mouth daily as needed.  90 capsule  1  . insulin NPH-regular (NOVOLIN 70/30 RELION) (70-30) 100 UNIT/ML injection 15 units with breakfast, and 5 units with the evening meal      . isosorbide mononitrate (IMDUR) 30 MG 24 hr tablet Take 1 tablet by mouth  every day  90 tablet  0  . LORazepam (ATIVAN) 0.5 MG tablet TAKE ONE TABLET BY MOUTH EVERY 6 HOURS AS NEEDED FOR ANXIETY  30 tablet  0  . meclizine (ANTIVERT) 25 MG tablet Take 1/2 to 1 tab three times a day as needed  15 tablet  0  . Multiple Vitamin (MULTIVITAMIN) tablet Take 1 tablet by mouth daily.        . nitroGLYCERIN (NITROSTAT) 0.4 MG SL tablet DISSOLVE ONE TABLET UNDER TONGUE AS DIRECTED FOR CHEST PAIN  25 tablet  5  . omeprazole (PRILOSEC) 20 MG capsule Take 1 capsule by mouth    every  morning  90 capsule  0  . quinapril (ACCUPRIL) 40 MG tablet Take 1 tablet by mouth    every day  90 tablet  0  . rosuvastatin (CRESTOR) 10 MG tablet Take 10 mg by mouth daily.      . vitamin E (VITAMIN E) 400 UNIT capsule Take 400 Units by mouth daily.         No current facility-administered medications for this visit.    BP 122/64  Pulse 54  Ht '5\' 2"'  (1.575 m)  Wt 187 lb (84.823 kg)  BMI 34.19 kg/m2 General: NAD Neck: No JVD, no thyromegaly or thyroid nodule.  Lungs: Clear to auscultation bilaterally with normal respiratory effort. CV: Nondisplaced PMI.  Heart regular S1/S2, no S3/S4, no murmur.  1+ edema 1/2 up lower legs bilaterally.  No carotid bruit.  Normal pedal pulses.  Abdomen: Soft, nontender, no hepatosplenomegaly, no distention.  Skin: Intact without lesions or rashes.  Neurologic: Alert and oriented x 3.  Psych: Normal affect. Extremities: No clubbing or cyanosis.   Assessment/Plan: 1. CAD: No ischemic symptoms.  She is doing well.  Continue ASA 81, statin, ACEI.   2.  Hyperlipidemia: Check lipids in 7/15, goal LDL < 70.  3. HTN: BP is controlled.  4. Lower extremity edema: No JVD.  Only mild dyspnea.  I do not think that significant CHF is present.   Larey Dresser 08/05/2013

## 2013-08-06 NOTE — Addendum Note (Signed)
Addended by: Jacqlyn Krauss on: 08/06/2013 10:10 AM   Modules accepted: Orders

## 2013-08-12 ENCOUNTER — Telehealth: Payer: Self-pay | Admitting: Cardiology

## 2013-08-12 NOTE — Telephone Encounter (Signed)
Left pt a detail message regarding her lab work and MD's recommendations. Pt to call back.

## 2013-08-12 NOTE — Telephone Encounter (Signed)
New message    Patient returning call back to nurse from this am.

## 2013-08-12 NOTE — Telephone Encounter (Signed)
Pt is aware of labs results and MD's recommendations. Pt states she was taking Lipitor 10 mg for a long time , then she started to have bad arthritis pain . Pt's friends told her to stop taking Lipitor because of the pain. Pt  asked her PCP in April this year if she could change to crestor 10 mg. Pt states the Lipitor was controlling her cholesterol levels, so she went back taking Lipitor 10 mg last night, because she found out the pain was not caused by Lipitor. Pt states the Lipitor will take care of her elevated LDL. Pt is already  scheduled for labs on July 13 th

## 2013-09-14 ENCOUNTER — Encounter (INDEPENDENT_AMBULATORY_CARE_PROVIDER_SITE_OTHER): Payer: Medicare HMO | Admitting: Ophthalmology

## 2013-09-14 DIAGNOSIS — H35039 Hypertensive retinopathy, unspecified eye: Secondary | ICD-10-CM

## 2013-09-14 DIAGNOSIS — H40019 Open angle with borderline findings, low risk, unspecified eye: Secondary | ICD-10-CM

## 2013-09-14 DIAGNOSIS — H4320 Crystalline deposits in vitreous body, unspecified eye: Secondary | ICD-10-CM

## 2013-09-14 DIAGNOSIS — E1165 Type 2 diabetes mellitus with hyperglycemia: Secondary | ICD-10-CM

## 2013-09-14 DIAGNOSIS — E11359 Type 2 diabetes mellitus with proliferative diabetic retinopathy without macular edema: Secondary | ICD-10-CM

## 2013-09-14 DIAGNOSIS — H43819 Vitreous degeneration, unspecified eye: Secondary | ICD-10-CM

## 2013-09-14 DIAGNOSIS — I10 Essential (primary) hypertension: Secondary | ICD-10-CM

## 2013-09-14 DIAGNOSIS — E1139 Type 2 diabetes mellitus with other diabetic ophthalmic complication: Secondary | ICD-10-CM

## 2013-09-21 ENCOUNTER — Other Ambulatory Visit (INDEPENDENT_AMBULATORY_CARE_PROVIDER_SITE_OTHER): Payer: Medicare PPO

## 2013-09-21 DIAGNOSIS — I2510 Atherosclerotic heart disease of native coronary artery without angina pectoris: Secondary | ICD-10-CM

## 2013-09-21 DIAGNOSIS — E7800 Pure hypercholesterolemia, unspecified: Secondary | ICD-10-CM

## 2013-09-21 LAB — HEPATIC FUNCTION PANEL
ALT: 17 U/L (ref 0–35)
AST: 22 U/L (ref 0–37)
Albumin: 3.7 g/dL (ref 3.5–5.2)
Alkaline Phosphatase: 45 U/L (ref 39–117)
Bilirubin, Direct: 0 mg/dL (ref 0.0–0.3)
Total Bilirubin: 0.6 mg/dL (ref 0.2–1.2)
Total Protein: 6.7 g/dL (ref 6.0–8.3)

## 2013-09-21 LAB — LIPID PANEL
Cholesterol: 176 mg/dL (ref 0–200)
HDL: 81.1 mg/dL (ref >39.00)
LDL Cholesterol: 74 mg/dL (ref 0–99)
NonHDL: 94.9
Total CHOL/HDL Ratio: 2
Triglycerides: 103 mg/dL (ref 0.0–149.0)
VLDL: 20.6 mg/dL (ref 0.0–40.0)

## 2014-01-15 ENCOUNTER — Ambulatory Visit (INDEPENDENT_AMBULATORY_CARE_PROVIDER_SITE_OTHER): Payer: Medicare HMO | Admitting: Ophthalmology

## 2014-01-15 DIAGNOSIS — E11359 Type 2 diabetes mellitus with proliferative diabetic retinopathy without macular edema: Secondary | ICD-10-CM

## 2014-01-15 DIAGNOSIS — H35033 Hypertensive retinopathy, bilateral: Secondary | ICD-10-CM

## 2014-01-15 DIAGNOSIS — I10 Essential (primary) hypertension: Secondary | ICD-10-CM

## 2014-01-15 DIAGNOSIS — H43813 Vitreous degeneration, bilateral: Secondary | ICD-10-CM

## 2014-01-15 DIAGNOSIS — E11319 Type 2 diabetes mellitus with unspecified diabetic retinopathy without macular edema: Secondary | ICD-10-CM

## 2014-02-02 ENCOUNTER — Other Ambulatory Visit (HOSPITAL_COMMUNITY): Payer: Self-pay | Admitting: Internal Medicine

## 2014-02-02 DIAGNOSIS — K219 Gastro-esophageal reflux disease without esophagitis: Secondary | ICD-10-CM

## 2014-02-08 ENCOUNTER — Ambulatory Visit (HOSPITAL_COMMUNITY): Payer: Medicare PPO

## 2014-02-11 ENCOUNTER — Other Ambulatory Visit (HOSPITAL_COMMUNITY): Payer: Self-pay | Admitting: Internal Medicine

## 2014-02-11 DIAGNOSIS — R1310 Dysphagia, unspecified: Secondary | ICD-10-CM

## 2014-02-17 ENCOUNTER — Ambulatory Visit (HOSPITAL_COMMUNITY): Admission: RE | Admit: 2014-02-17 | Payer: Medicare PPO | Source: Ambulatory Visit

## 2014-02-17 ENCOUNTER — Ambulatory Visit (HOSPITAL_COMMUNITY): Payer: Medicare PPO

## 2014-05-13 ENCOUNTER — Encounter: Payer: Self-pay | Admitting: Podiatry

## 2014-05-13 ENCOUNTER — Ambulatory Visit (INDEPENDENT_AMBULATORY_CARE_PROVIDER_SITE_OTHER): Payer: Medicare HMO | Admitting: Podiatry

## 2014-05-13 ENCOUNTER — Ambulatory Visit (INDEPENDENT_AMBULATORY_CARE_PROVIDER_SITE_OTHER): Payer: Medicare HMO

## 2014-05-13 VITALS — BP 179/80 | HR 60 | Resp 16

## 2014-05-13 DIAGNOSIS — L89891 Pressure ulcer of other site, stage 1: Secondary | ICD-10-CM

## 2014-05-13 DIAGNOSIS — M2042 Other hammer toe(s) (acquired), left foot: Secondary | ICD-10-CM

## 2014-05-13 DIAGNOSIS — M6789 Other specified disorders of synovium and tendon, multiple sites: Secondary | ICD-10-CM

## 2014-05-13 DIAGNOSIS — L97529 Non-pressure chronic ulcer of other part of left foot with unspecified severity: Secondary | ICD-10-CM

## 2014-05-13 DIAGNOSIS — E11621 Type 2 diabetes mellitus with foot ulcer: Secondary | ICD-10-CM

## 2014-05-13 DIAGNOSIS — E119 Type 2 diabetes mellitus without complications: Secondary | ICD-10-CM

## 2014-05-13 DIAGNOSIS — I739 Peripheral vascular disease, unspecified: Secondary | ICD-10-CM

## 2014-05-13 DIAGNOSIS — M76829 Posterior tibial tendinitis, unspecified leg: Secondary | ICD-10-CM

## 2014-05-13 NOTE — Patient Instructions (Signed)
Diabetes and Foot Care Diabetes may cause you to have problems because of poor blood supply (circulation) to your feet and legs. This may cause the skin on your feet to become thinner, break easier, and heal more slowly. Your skin may become dry, and the skin may peel and crack. You may also have nerve damage in your legs and feet causing decreased feeling in them. You may not notice minor injuries to your feet that could lead to infections or more serious problems. Taking care of your feet is one of the most important things you can do for yourself.  HOME CARE INSTRUCTIONS  Wear shoes at all times, even in the house. Do not go barefoot. Bare feet are easily injured.  Check your feet daily for blisters, cuts, and redness. If you cannot see the bottom of your feet, use a mirror or ask someone for help.  Wash your feet with warm water (do not use hot water) and mild soap. Then pat your feet and the areas between your toes until they are completely dry. Do not soak your feet as this can dry your skin.  Apply a moisturizing lotion or petroleum jelly (that does not contain alcohol and is unscented) to the skin on your feet and to dry, brittle toenails. Do not apply lotion between your toes.  Trim your toenails straight across. Do not dig under them or around the cuticle. File the edges of your nails with an emery board or nail file.  Do not cut corns or calluses or try to remove them with medicine.  Wear clean socks or stockings every day. Make sure they are not too tight. Do not wear knee-high stockings since they may decrease blood flow to your legs.  Wear shoes that fit properly and have enough cushioning. To break in new shoes, wear them for just a few hours a day. This prevents you from injuring your feet. Always look in your shoes before you put them on to be sure there are no objects inside.  Do not cross your legs. This may decrease the blood flow to your feet.  If you find a minor scrape,  cut, or break in the skin on your feet, keep it and the skin around it clean and dry. These areas may be cleansed with mild soap and water. Do not cleanse the area with peroxide, alcohol, or iodine.  When you remove an adhesive bandage, be sure not to damage the skin around it.  If you have a wound, look at it several times a day to make sure it is healing.  Do not use heating pads or hot water bottles. They may burn your skin. If you have lost feeling in your feet or legs, you may not know it is happening until it is too late.  Make sure your health care provider performs a complete foot exam at least annually or more often if you have foot problems. Report any cuts, sores, or bruises to your health care provider immediately. SEEK MEDICAL CARE IF:   You have an injury that is not healing.  You have cuts or breaks in the skin.  You have an ingrown nail.  You notice redness on your legs or feet.  You feel burning or tingling in your legs or feet.  You have pain or cramps in your legs and feet.  Your legs or feet are numb.  Your feet always feel cold. SEEK IMMEDIATE MEDICAL CARE IF:   There is increasing redness,   swelling, or pain in or around a wound.  There is a red line that goes up your leg.  Pus is coming from a wound.  You develop a fever or as directed by your health care provider.  You notice a bad smell coming from an ulcer or wound. Document Released: 02/24/2000 Document Revised: 10/29/2012 Document Reviewed: 08/05/2012 ExitCare Patient Information 2015 [employer]. This information is not intended to replace advice given to you by your health care provider. Make sure you discuss any questions you have with your health care provider.  

## 2014-05-13 NOTE — Progress Notes (Signed)
   Subjective:    Patient ID: Rebecca [name], female    DOB: 1932/04/09, 81 y.o.   MRN: [id number]  HPI Comments: "I have a sore toe"  Patient c/o aching 4th toe left for few months. The tip of the toe is callused and red. Enclosed shoes are uncomfortable. She has been using oil and vaseline on it to keep soft and wears an open toe shoe. Her last A1C was 7.0.   Patient would like diabetic shoes. Her doctor is Dr. Fleet ContrasEdwin Avbuere.   Toe Pain       Review of Systems  Constitutional: Positive for activity change.  Musculoskeletal: Positive for arthralgias and gait problem.  All other systems reviewed and are negative.      Objective:   Physical Exam: I have reviewed her past medical history medications allergies social history and review of systems. Pulses are limited on palpation +1 over 4 DP and PT bilateral but are palpable. Capillary fill time is immediate. Neurologic sensorium is decreased per Semmes-Weinstein monofilament. Deep tendon reflexes are intact bilaterally muscle strength +5 over 5 dorsiflexion plantar flexors and inverters everters all intrinsic musculature is intact. Orthopedic evaluation of his resolved joints distal to the ankle level range of motion without crepitation. She has severe flatfoot deformity and posterior tibial tendon dysfunction left greater than right with severe subluxation of her midfoot on her rear foot. Left greater than right is also noted for hammertoe deformity with superficial ulceration distal aspect fourth toe left foot does not appear to probe to bone. Cutaneous evaluation demonstrates supple well-hydrated cutis hammertoe deformity is resulting in a distal clavus fourth digit left foot upon debridement reveals superficial ulceration no signs of infection clinically. Radiographs evaluation does demonstrates severe osteoarthritis severe subluxation of the subtalar joint and midtarsal joints on her rear foot.      Assessment & Plan:    Assessment: Diabetes mellitus with peripheral arterial disease and diabetic peripheral neuropathy. Posterior tibial tendon dysfunction with flatfoot deformity bilateral. Hammertoe deformity resulting in superficial ulceration fourth digit left foot not complicated.  Plan: Debridement and reactive hyperkeratosis and ulceration today to bleeding place padding and discussed appropriate therapy such as soaking and antibiotic ointments. We also skinned her very diabetic shoes today I will follow-up with her once those command. May need to consider [place] brace.

## 2014-06-22 ENCOUNTER — Ambulatory Visit (INDEPENDENT_AMBULATORY_CARE_PROVIDER_SITE_OTHER): Payer: Medicare HMO | Admitting: Podiatry

## 2014-06-22 ENCOUNTER — Encounter: Payer: Self-pay | Admitting: Podiatry

## 2014-06-22 VITALS — BP 155/74 | HR 76 | Resp 12

## 2014-06-22 DIAGNOSIS — L89891 Pressure ulcer of other site, stage 1: Secondary | ICD-10-CM | POA: Diagnosis not present

## 2014-06-22 DIAGNOSIS — L97529 Non-pressure chronic ulcer of other part of left foot with unspecified severity: Principal | ICD-10-CM

## 2014-06-22 DIAGNOSIS — I739 Peripheral vascular disease, unspecified: Secondary | ICD-10-CM | POA: Diagnosis not present

## 2014-06-22 DIAGNOSIS — M2042 Other hammer toe(s) (acquired), left foot: Secondary | ICD-10-CM

## 2014-06-22 DIAGNOSIS — E11621 Type 2 diabetes mellitus with foot ulcer: Secondary | ICD-10-CM | POA: Diagnosis not present

## 2014-06-22 NOTE — Patient Instructions (Signed)

## 2014-06-22 NOTE — Progress Notes (Signed)
Patient ID: Rebecca HashimotoParthenia [name], female   DOB: 03/08/1933, 81 y.o.   MRN: [id number] PATIENT PRESENTS FOR DIABETIC SHOE PICK UP.  SHOES ARE TRIED ON FOR GOOD FIT. PATIENT RECEIVED 1 PAIR SHOES APEX [employer] 12 WIDE WITH 3 PAIRS DIABETIC CUSTOM INSERTS.  WRITTEN BREAK IN AND WEAR INSTRUCTIONS GIVEN     Subjective: She presents today not only to pick up her diabetic shoes but also for evaluation of the fourth digit of the left foot. She still has pain she states to the distal aspect of the fourth digit which was worse until the skin peeled off the end. She denies fever chills nausea vomiting muscle aches and pains.  Objective: While signs are stable she is alert and oriented 3 pulses to the left foot are intact hammertoe deformities with a distal clavus and superficial ulceration fourth digit left foot that does not appear to be clinically infected at this time.  Assessment: Diabetes mellitus with diabetic peripheral vascular disease diabetic peripheral neuropathy hammertoe deformity as well as ulceration.  Plan: Debrided reactive hyperkeratotic lesion and she received a buttress pad for her toes and was dispensed her diabetic shoes. I will follow up with her in 1 month

## 2014-07-07 ENCOUNTER — Encounter (HOSPITAL_BASED_OUTPATIENT_CLINIC_OR_DEPARTMENT_OTHER): Payer: Medicare PPO

## 2014-07-14 ENCOUNTER — Telehealth: Payer: Self-pay | Admitting: *Deleted

## 2014-07-14 NOTE — Telephone Encounter (Signed)
Pt states she will be seen next week and will do the diabetic shoes then.

## 2014-07-20 ENCOUNTER — Ambulatory Visit (INDEPENDENT_AMBULATORY_CARE_PROVIDER_SITE_OTHER): Payer: Medicare HMO | Admitting: Ophthalmology

## 2014-07-20 ENCOUNTER — Ambulatory Visit: Payer: Medicare HMO | Admitting: Podiatry

## 2014-07-20 DIAGNOSIS — E11359 Type 2 diabetes mellitus with proliferative diabetic retinopathy without macular edema: Secondary | ICD-10-CM | POA: Diagnosis not present

## 2014-07-20 DIAGNOSIS — H43813 Vitreous degeneration, bilateral: Secondary | ICD-10-CM | POA: Diagnosis not present

## 2014-07-20 DIAGNOSIS — I10 Essential (primary) hypertension: Secondary | ICD-10-CM

## 2014-07-20 DIAGNOSIS — E11319 Type 2 diabetes mellitus with unspecified diabetic retinopathy without macular edema: Secondary | ICD-10-CM | POA: Diagnosis not present

## 2014-07-20 DIAGNOSIS — H35033 Hypertensive retinopathy, bilateral: Secondary | ICD-10-CM

## 2014-07-27 ENCOUNTER — Encounter: Payer: Self-pay | Admitting: Podiatry

## 2014-07-27 ENCOUNTER — Ambulatory Visit (INDEPENDENT_AMBULATORY_CARE_PROVIDER_SITE_OTHER): Payer: Medicare HMO | Admitting: Podiatry

## 2014-07-27 DIAGNOSIS — B351 Tinea unguium: Secondary | ICD-10-CM | POA: Diagnosis not present

## 2014-07-27 DIAGNOSIS — M79673 Pain in unspecified foot: Secondary | ICD-10-CM | POA: Diagnosis not present

## 2014-07-27 NOTE — Progress Notes (Signed)
Patient picks up diabetic shoes and insoles. They were sent back at previous visit due to the left one being a little too tight. Fit is satisfactory today. Instructions were reviewed. Patient also requesting toenails to be trimmed today.   Objective: Pulses are palpable bilateral. Nails are thick yellow dystrophic clinically mycotic and painful palpation.  Assessment: Pain in limb secondary to onychomycosis 1 through 5 bilateral.  Plan: Debridement of nails 1 through 5 bilateral covered service secondary to pain.

## 2014-07-27 NOTE — Patient Instructions (Signed)

## 2014-08-05 ENCOUNTER — Ambulatory Visit: Payer: Medicare PPO | Admitting: Cardiology

## 2014-09-30 ENCOUNTER — Ambulatory Visit: Payer: Medicare HMO | Admitting: Podiatry

## 2014-10-07 ENCOUNTER — Ambulatory Visit (INDEPENDENT_AMBULATORY_CARE_PROVIDER_SITE_OTHER): Payer: Medicare HMO | Admitting: Podiatry

## 2014-10-07 ENCOUNTER — Encounter: Payer: Self-pay | Admitting: Podiatry

## 2014-10-07 DIAGNOSIS — M6789 Other specified disorders of synovium and tendon, multiple sites: Secondary | ICD-10-CM

## 2014-10-07 DIAGNOSIS — Q6650 Congenital pes planus, unspecified foot: Secondary | ICD-10-CM

## 2014-10-07 DIAGNOSIS — M76829 Posterior tibial tendinitis, unspecified leg: Secondary | ICD-10-CM

## 2014-10-07 NOTE — Progress Notes (Signed)
  She presents today stating that her orthotics from her diabetic shoes are the only things that feel good. The diabetic shoes themselves really make her feet hurt. She presents today wearing a pair of new balance with the diabetic insoles in the new balance tennis shoes. She states that she has a lot of soreness right and year if she points to the posterior tibial tendon area of the left foot.  Objective: Vital signs are stable she is alert and oriented 3. She has pain on palpation of the posterior tibial tendon and of the navicular tuberosity. She has pain on palpation of pes planus at the point of attachment of the posterior tibial tendon there is very little flexibility in this flatfoot deformity.  Assessment: Posterior tibial tendon dysfunction left foot with diabetes mellitus.  Plan: She was scheduled today for an [place] brace fabrication.

## 2014-10-20 ENCOUNTER — Ambulatory Visit: Payer: Medicare HMO | Admitting: *Deleted

## 2014-10-20 DIAGNOSIS — M76829 Posterior tibial tendinitis, unspecified leg: Secondary | ICD-10-CM

## 2014-11-01 ENCOUNTER — Encounter: Payer: Self-pay | Admitting: Cardiology

## 2014-11-01 ENCOUNTER — Ambulatory Visit (INDEPENDENT_AMBULATORY_CARE_PROVIDER_SITE_OTHER): Payer: Medicare HMO | Admitting: Cardiology

## 2014-11-01 VITALS — BP 118/68 | HR 62 | Ht 62.0 in | Wt 186.0 lb

## 2014-11-01 DIAGNOSIS — E785 Hyperlipidemia, unspecified: Secondary | ICD-10-CM

## 2014-11-01 DIAGNOSIS — I2510 Atherosclerotic heart disease of native coronary artery without angina pectoris: Secondary | ICD-10-CM

## 2014-11-01 NOTE — Patient Instructions (Addendum)
Medication Instructions:  Take glucosamine/chondroitin as directed for joint pain.  Labwork: Your physician recommends that you return for a FASTING lipid profile /BMET next Tuesday August 30,2016    Testing/Procedures: None today  Follow-Up: Your physician wants you to follow-up in: 1 year with Dr Shirlee Latch. (August 2017).You will receive a reminder letter in the mail two months in advance. If you don't receive a letter, please call our office to schedule the follow-up appointment.    Thank you for choosing [facility]!!

## 2014-11-01 NOTE — Progress Notes (Signed)
Patient ID: Rebecca [name], female   DOB: [birth year], 81 y.o.   MRN: [id number] PCP: Dr. Jeanie Cooks  81 yo with history of CAD presents for cardiology followup. She had PCI in the 1990s with 2 stents.  Since that time, no PCI.  Main problem currently is knee, back, and foot pain (arthritis).  She uses a cane for balance.  No exertional chest pain.  She is short of breath after walking up a flight of steps.  No dyspnea walking on flat ground.  No lightheadedness, no tachypalpitations.  Her diabetes is under good control.    Labs (3/14): K 3.9, creatinine 0.9 Labs (7/14); LDL 91, HDL 82 Labs (7/15): LDL 74, HDL 81  PMH: 1. OA: knees, hips, low back 2. CAD: PCI in the 1990s with 2 stents.   3. Hyperlipidemia 4. GERD 5. Sickle cell trait 6. Multinodular goiter 7. Hiatal hernia 8. Type II diabetes 9. H/o back surgery  SH: Widow, nonsmoker, lives in [place], son lives next door.  FH: Parents both had MIs.  ROS: All systems reviewed and negative except as per HPI.   Current Outpatient Prescriptions  Medication Sig Dispense Refill  . ACCU-CHEK FASTCLIX LANCETS MISC Use to test two times daily 204 each 11  . ACCU-CHEK SMARTVIEW test strip Use 1 two times daily as  instructed 200 each 11  . Alcohol Swabs (B-D SINGLE USE SWABS REGULAR) PADS     . amLODipine (NORVASC) 10 MG tablet Take 1 tablet by mouth     every day 90 tablet 0  . Ascorbic Acid (VITAMIN C) 500 MG tablet Take 500 mg by mouth 2 (two) times daily.      Marland Kitchen aspirin 81 MG tablet Take 81 mg by mouth daily.      Marland Kitchen atorvastatin (LIPITOR) 10 MG tablet Take 1 tablet by mouth daily.    . Blood Glucose Monitoring Suppl (ACCU-CHEK NANO SMARTVIEW) W/DEVICE KIT 1 Device by Does not apply route once. 1 kit 0  . calcium carbonate (OS-CAL) 600 MG TABS Take 600 mg by mouth daily.     . Cholecalciferol (VITAMIN D3) 5000 UNITS CAPS Take 5,000 Units by mouth daily.      Marland Kitchen Cod Liver Oil 1000 MG CAPS Take 1,000 mg by mouth daily. During the  winter    . hydrochlorothiazide (MICROZIDE) 12.5 MG capsule Take 1 capsule (12.5 mg total) by mouth daily as needed. 90 capsule 1  . insulin NPH-regular (NOVOLIN 70/30 RELION) (70-30) 100 UNIT/ML injection 15 units with breakfast, and 5 units with the evening meal    . isosorbide mononitrate (IMDUR) 30 MG 24 hr tablet Take 1 tablet by mouth  every day 90 tablet 0  . LORazepam (ATIVAN) 0.5 MG tablet TAKE ONE TABLET BY MOUTH EVERY 6 HOURS AS NEEDED FOR ANXIETY 30 tablet 0  . meclizine (ANTIVERT) 25 MG tablet Take 1/2 to 1 tab three times a day as needed 15 tablet 0  . meloxicam (MOBIC) 15 MG tablet Take 15 mg by mouth daily.    . Multiple Vitamin (MULTIVITAMIN) tablet Take 1 tablet by mouth daily.      . nitroGLYCERIN (NITROSTAT) 0.4 MG SL tablet DISSOLVE ONE TABLET UNDER TONGUE AS DIRECTED FOR CHEST PAIN 25 tablet 5  . omeprazole (PRILOSEC) 20 MG capsule Take 1 capsule by mouth    every morning 90 capsule 0  . quinapril (ACCUPRIL) 40 MG tablet Take 1 tablet by mouth    every day 90 tablet 0  . traMADol (  ULTRAM) 50 MG tablet Take by mouth every 6 (six) hours as needed.    . vitamin E (VITAMIN E) 400 UNIT capsule Take 400 Units by mouth daily.       No current facility-administered medications for this visit.    BP 118/68 mmHg  Pulse 62  Ht '5\' 2"'  (1.575 m)  Wt 186 lb (84.369 kg)  BMI 34.01 kg/m2  SpO2 97% General: NAD Neck: No JVD, no thyromegaly or thyroid nodule.  Lungs: Clear to auscultation bilaterally with normal respiratory effort. CV: Nondisplaced PMI.  Heart regular S1/S2, no S3/S4, no murmur.  1+ ankle edema.  No carotid bruit.  Normal pedal pulses.  Abdomen: Soft, nontender, no hepatosplenomegaly, no distention.  Skin: Intact without lesions or rashes.  Neurologic: Alert and oriented x 3.  Psych: Normal affect. Extremities: No clubbing or cyanosis.   Assessment/Plan: 1. CAD: No ischemic symptoms.  She is doing well.  Continue ASA 81, statin, ACEI.   2. Hyperlipidemia: Check  lipids, goal LDL < 70.  3. HTN: BP is controlled.  4. Arthritis: Try glucosamine.   Loralie Champagne 11/01/2014

## 2014-11-09 ENCOUNTER — Other Ambulatory Visit (INDEPENDENT_AMBULATORY_CARE_PROVIDER_SITE_OTHER): Payer: Medicare HMO | Admitting: *Deleted

## 2014-11-09 DIAGNOSIS — E785 Hyperlipidemia, unspecified: Secondary | ICD-10-CM | POA: Diagnosis not present

## 2014-11-09 DIAGNOSIS — I2510 Atherosclerotic heart disease of native coronary artery without angina pectoris: Secondary | ICD-10-CM | POA: Diagnosis not present

## 2014-11-09 LAB — LIPID PANEL
Cholesterol: 181 mg/dL (ref 0–200)
HDL: 73 mg/dL (ref >39.00)
LDL Cholesterol: 94 mg/dL (ref 0–99)
NonHDL: 108.1
Total CHOL/HDL Ratio: 2
Triglycerides: 72 mg/dL (ref 0.0–149.0)
VLDL: 14.4 mg/dL (ref 0.0–40.0)

## 2014-11-09 LAB — BASIC METABOLIC PANEL
BUN: 26 mg/dL — ABNORMAL HIGH (ref 6–23)
CO2: 26 mEq/L (ref 19–32)
Calcium: 9.3 mg/dL (ref 8.4–10.5)
Chloride: 107 mEq/L (ref 96–112)
Creatinine, Ser: 1.04 mg/dL (ref 0.40–1.20)
GFR: 65.51 mL/min (ref >60.00)
Glucose, Bld: 65 mg/dL — ABNORMAL LOW (ref 70–99)
Potassium: 4.1 mEq/L (ref 3.5–5.1)
Sodium: 140 mEq/L (ref 135–145)

## 2014-11-09 NOTE — Addendum Note (Signed)
Addended by: Tonita Phoenix on: 11/09/2014 09:00 AM   Modules accepted: Orders

## 2014-11-09 NOTE — Addendum Note (Signed)
Addended by: BOWDEN, ROBIN K on: 11/09/2014 09:00 AM   Modules accepted: Orders  

## 2014-11-12 ENCOUNTER — Telehealth: Payer: Self-pay

## 2014-11-12 DIAGNOSIS — E785 Hyperlipidemia, unspecified: Secondary | ICD-10-CM

## 2014-11-12 MED ORDER — ATORVASTATIN CALCIUM 20 MG PO TABS: 20.0000 mg | ORAL_TABLET | Freq: Every day | ORAL | Status: DC

## 2014-11-12 NOTE — Telephone Encounter (Signed)
Per  Dr. Shirlee Latch, Increase atorvastatin to 20 with lipids/LFTs in 2 months. Patient verbalized understanding and will get lab work on 01/14/2015.

## 2014-11-17 ENCOUNTER — Other Ambulatory Visit: Payer: Medicare HMO

## 2014-11-17 NOTE — Progress Notes (Signed)
Patient ID: Rebecca [name], female   DOB: 14-Apr-1932, 81 y.o.   MRN: [id number] Patient presents for brace casting with [name]

## 2014-12-22 ENCOUNTER — Ambulatory Visit: Payer: Medicare HMO | Admitting: *Deleted

## 2014-12-22 DIAGNOSIS — M76822 Posterior tibial tendinitis, left leg: Secondary | ICD-10-CM | POA: Diagnosis not present

## 2014-12-22 DIAGNOSIS — M76829 Posterior tibial tendinitis, unspecified leg: Secondary | ICD-10-CM

## 2014-12-22 DIAGNOSIS — M2142 Flat foot [pes planus] (acquired), left foot: Secondary | ICD-10-CM | POA: Diagnosis not present

## 2014-12-23 NOTE — Progress Notes (Signed)
Patient ID: Elease HashimotoParthenia [name], female   DOB: [birth year], 81 y.o.   MRN: [id number] [facility]

## 2015-01-14 ENCOUNTER — Other Ambulatory Visit (INDEPENDENT_AMBULATORY_CARE_PROVIDER_SITE_OTHER): Payer: Medicare HMO | Admitting: *Deleted

## 2015-01-14 DIAGNOSIS — E785 Hyperlipidemia, unspecified: Secondary | ICD-10-CM

## 2015-01-14 LAB — HEPATIC FUNCTION PANEL
ALT: 15 U/L (ref 6–29)
AST: 20 U/L (ref 10–35)
Albumin: 3.6 g/dL (ref 3.6–5.1)
Alkaline Phosphatase: 49 U/L (ref 33–130)
Bilirubin, Direct: 0.1 mg/dL (ref <=0.2)
Indirect Bilirubin: 0.2 mg/dL (ref 0.2–1.2)
Total Bilirubin: 0.3 mg/dL (ref 0.2–1.2)
Total Protein: 6.4 g/dL (ref 6.1–8.1)

## 2015-01-14 LAB — LIPID PANEL
Cholesterol: 177 mg/dL (ref 125–200)
HDL: 86 mg/dL (ref >=46)
LDL Cholesterol: 74 mg/dL (ref <130)
Total CHOL/HDL Ratio: 2.1 Ratio (ref <=5.0)
Triglycerides: 86 mg/dL (ref <150)
VLDL: 17 mg/dL (ref <30)

## 2015-01-20 ENCOUNTER — Ambulatory Visit (INDEPENDENT_AMBULATORY_CARE_PROVIDER_SITE_OTHER): Payer: Medicare HMO | Admitting: Ophthalmology

## 2015-01-20 DIAGNOSIS — H35033 Hypertensive retinopathy, bilateral: Secondary | ICD-10-CM | POA: Diagnosis not present

## 2015-01-20 DIAGNOSIS — I10 Essential (primary) hypertension: Secondary | ICD-10-CM

## 2015-01-20 DIAGNOSIS — E11319 Type 2 diabetes mellitus with unspecified diabetic retinopathy without macular edema: Secondary | ICD-10-CM

## 2015-01-20 DIAGNOSIS — E113593 Type 2 diabetes mellitus with proliferative diabetic retinopathy without macular edema, bilateral: Secondary | ICD-10-CM

## 2015-01-20 DIAGNOSIS — H43813 Vitreous degeneration, bilateral: Secondary | ICD-10-CM

## 2015-02-08 ENCOUNTER — Ambulatory Visit: Payer: Medicare HMO | Admitting: Podiatry

## 2015-02-24 ENCOUNTER — Ambulatory Visit (INDEPENDENT_AMBULATORY_CARE_PROVIDER_SITE_OTHER): Payer: Medicare HMO | Admitting: Podiatry

## 2015-02-24 ENCOUNTER — Encounter: Payer: Self-pay | Admitting: Podiatry

## 2015-02-24 VITALS — BP 134/57 | HR 64 | Resp 16

## 2015-02-24 DIAGNOSIS — M6789 Other specified disorders of synovium and tendon, multiple sites: Secondary | ICD-10-CM | POA: Diagnosis not present

## 2015-02-24 DIAGNOSIS — M76829 Posterior tibial tendinitis, unspecified leg: Secondary | ICD-10-CM

## 2015-02-27 NOTE — Progress Notes (Signed)
She presents today for her posterior tibial tendon dysfunction and flatfoot deformity left. We dispensed an [place] brace for her several weeks ago. She states that she is unable to wear it because she cannot find shoes that would fit or accommodative brace.  She does relate that the foot feels better in the brace.  Objective: Vital signs are stable she is alert and oriented 3. She has pain on palpation plantar medial aspect of the right foot secondary to navicular tuberosity rubbing the floor. There is no skin breakdown as of yet there is mild hypertrophic callus present.   Assessment: I recommended that she initially find shoe it would accommodate her [place] brace. I recommended that she wear the [place] brace on a regular basis. I injected 2 mg of dexamethasone to the point of maximal tenderness to help alleviate her symptoms today. I will follow-up with her as needed.

## 2015-04-12 DIAGNOSIS — E784 Other hyperlipidemia: Secondary | ICD-10-CM | POA: Diagnosis not present

## 2015-04-12 DIAGNOSIS — E11311 Type 2 diabetes mellitus with unspecified diabetic retinopathy with macular edema: Secondary | ICD-10-CM | POA: Diagnosis not present

## 2015-04-12 DIAGNOSIS — F419 Anxiety disorder, unspecified: Secondary | ICD-10-CM | POA: Diagnosis not present

## 2015-04-12 DIAGNOSIS — M179 Osteoarthritis of knee, unspecified: Secondary | ICD-10-CM | POA: Diagnosis not present

## 2015-04-12 DIAGNOSIS — Z719 Counseling, unspecified: Secondary | ICD-10-CM | POA: Diagnosis not present

## 2015-04-12 DIAGNOSIS — I2510 Atherosclerotic heart disease of native coronary artery without angina pectoris: Secondary | ICD-10-CM | POA: Diagnosis not present

## 2015-04-12 DIAGNOSIS — I10 Essential (primary) hypertension: Secondary | ICD-10-CM | POA: Diagnosis not present

## 2015-04-25 DIAGNOSIS — E1165 Type 2 diabetes mellitus with hyperglycemia: Secondary | ICD-10-CM | POA: Diagnosis not present

## 2015-04-25 DIAGNOSIS — Z961 Presence of intraocular lens: Secondary | ICD-10-CM | POA: Diagnosis not present

## 2015-04-25 DIAGNOSIS — H26491 Other secondary cataract, right eye: Secondary | ICD-10-CM | POA: Diagnosis not present

## 2015-04-25 DIAGNOSIS — H40013 Open angle with borderline findings, low risk, bilateral: Secondary | ICD-10-CM | POA: Diagnosis not present

## 2015-07-14 ENCOUNTER — Other Ambulatory Visit (INDEPENDENT_AMBULATORY_CARE_PROVIDER_SITE_OTHER): Payer: PPO

## 2015-07-14 ENCOUNTER — Encounter: Payer: Self-pay | Admitting: Internal Medicine

## 2015-07-14 ENCOUNTER — Ambulatory Visit (INDEPENDENT_AMBULATORY_CARE_PROVIDER_SITE_OTHER): Payer: PPO | Admitting: Internal Medicine

## 2015-07-14 VITALS — BP 140/74 | HR 60 | Temp 97.9°F | Resp 16 | Wt 205.0 lb

## 2015-07-14 DIAGNOSIS — I10 Essential (primary) hypertension: Secondary | ICD-10-CM

## 2015-07-14 DIAGNOSIS — E1142 Type 2 diabetes mellitus with diabetic polyneuropathy: Secondary | ICD-10-CM

## 2015-07-14 DIAGNOSIS — I2510 Atherosclerotic heart disease of native coronary artery without angina pectoris: Secondary | ICD-10-CM

## 2015-07-14 DIAGNOSIS — Z794 Long term (current) use of insulin: Secondary | ICD-10-CM

## 2015-07-14 DIAGNOSIS — Z23 Encounter for immunization: Secondary | ICD-10-CM

## 2015-07-14 DIAGNOSIS — E7800 Pure hypercholesterolemia, unspecified: Secondary | ICD-10-CM

## 2015-07-14 DIAGNOSIS — E042 Nontoxic multinodular goiter: Secondary | ICD-10-CM

## 2015-07-14 DIAGNOSIS — K219 Gastro-esophageal reflux disease without esophagitis: Secondary | ICD-10-CM

## 2015-07-14 LAB — COMPREHENSIVE METABOLIC PANEL
ALT: 18 U/L (ref 0–35)
AST: 17 U/L (ref 0–37)
Albumin: 4 g/dL (ref 3.5–5.2)
Alkaline Phosphatase: 48 U/L (ref 39–117)
BUN: 22 mg/dL (ref 6–23)
CO2: 28 mEq/L (ref 19–32)
Calcium: 9.8 mg/dL (ref 8.4–10.5)
Chloride: 104 mEq/L (ref 96–112)
Creatinine, Ser: 0.99 mg/dL (ref 0.40–1.20)
GFR: 69.22 mL/min (ref >60.00)
Glucose, Bld: 132 mg/dL — ABNORMAL HIGH (ref 70–99)
Potassium: 4.3 mEq/L (ref 3.5–5.1)
Sodium: 141 mEq/L (ref 135–145)
Total Bilirubin: 0.5 mg/dL (ref 0.2–1.2)
Total Protein: 6.8 g/dL (ref 6.0–8.3)

## 2015-07-14 LAB — CBC WITH DIFFERENTIAL/PLATELET
Basophils Absolute: 0.1 10*3/uL (ref 0.0–0.1)
Basophils Relative: 1 % (ref 0.0–3.0)
Eosinophils Absolute: 0.2 10*3/uL (ref 0.0–0.7)
Eosinophils Relative: 3.5 % (ref 0.0–5.0)
HCT: 36.5 % (ref 36.0–46.0)
Hemoglobin: 12.1 g/dL (ref 12.0–15.0)
Lymphocytes Relative: 34.2 % (ref 12.0–46.0)
Lymphs Abs: 2 10*3/uL (ref 0.7–4.0)
MCHC: 33.3 g/dL (ref 30.0–36.0)
MCV: 79.7 fl (ref 78.0–100.0)
Monocytes Absolute: 0.6 10*3/uL (ref 0.1–1.0)
Monocytes Relative: 9.9 % (ref 3.0–12.0)
Neutro Abs: 3 10*3/uL (ref 1.4–7.7)
Neutrophils Relative %: 51.4 % (ref 43.0–77.0)
Platelets: 251 10*3/uL (ref 150.0–400.0)
RBC: 4.59 Mil/uL (ref 3.87–5.11)
RDW: 16.4 % — ABNORMAL HIGH (ref 11.5–15.5)
WBC: 5.9 10*3/uL (ref 4.0–10.5)

## 2015-07-14 LAB — HEMOGLOBIN A1C: Hgb A1c MFr Bld: 7.9 % — ABNORMAL HIGH (ref 4.6–6.5)

## 2015-07-14 LAB — TSH: TSH: 0.8 u[IU]/mL (ref 0.35–4.50)

## 2015-07-14 NOTE — Assessment & Plan Note (Signed)
Blood pressure slightly elevated here today, but controlled at home We'll continue current medications

## 2015-07-14 NOTE — Assessment & Plan Note (Signed)
Check TSH 

## 2015-07-14 NOTE — Assessment & Plan Note (Signed)
GERD controlled Continue daily medication  

## 2015-07-14 NOTE — Patient Instructions (Addendum)
  Test(s) ordered today. Your results will be released to MyChart (or called to you) after review, usually within 72hours after test completion. If any changes need to be made, you will be notified at that same time.   prevnar vaccine administered today.   Medications reviewed and updated.  No changes recommended at this time.  Your prescription(s) have been submitted to your pharmacy. Please take as directed and contact our office if you believe you are having problem(s) with the medication(s).   Please followup in 3 months

## 2015-07-14 NOTE — Assessment & Plan Note (Signed)
Taking atorvastatin 10 mg daily Last lipid panel well controlled Continue above

## 2015-07-14 NOTE — Progress Notes (Signed)
Pre visit review using our clinic review tool, if applicable. No additional management support is needed unless otherwise documented below in the visit note. 

## 2015-07-14 NOTE — Assessment & Plan Note (Signed)
Following with cardiology Asymptomatic Continue current medications 

## 2015-07-14 NOTE — Assessment & Plan Note (Signed)
Check A1c Diabetes has been controlled Continue current dose of insulin Continue healthy diet and regular exercise

## 2015-07-14 NOTE — Progress Notes (Signed)
Subjective:    Patient ID: Rebecca [name], female    DOB: 1932-10-09, 82 y.o.   MRN: [id number]  HPI She is here to establish with a new pcp.    coronary artery disease:  She had stents placed in the 1990s. Has been asymptomatic since then. She denies chest pain Hypertension: She is taking her medication daily. She is compliant with a low sodium diet.  She denies chest pain, palpitations, shortness breath and lightheadedness. She does experience intermittent leg swelling and takes hydrochlorothiazide only as needed.  Hypertension: She is taking her medication daily. She is compliant with a low sodium diet.  She denies chest pain, palpitations, shortness of breath and regular headaches. She is exercising regularly.  She does not monitor her blood   Diabetes: She is taking her medication daily as prescribed. She is compliant with a diabetic diet. She is exercising regularly. She checks her feet daily and denies foot lesions. She is up-to-date with an ophthalmology examination.   GERD:  She is taking her medication daily as prescribed.  She denies any GERD symptoms and feels her GERD is well controlled. If she misses a dose she has GERD.   Anxiety, insomnia:  She takes the ativan only as needed.    Chronic pain:  She has lower back pain, right shoulder pain, right knee pain, left ankle and hands from arthritis.  She takes tramadol as needed and meloxicam daily.  She does not feel like the medication works that well anymore.  She has difficulty walking and uses a cane.    Foot pain:  Has a long history of foot pain.  Has a history of calluses.  She denies numbness and tingling in her feet now, but has been diagnosed with peripheral neuropathy.  She has tendernes in the posterior medial aspect of her lower leg.  She has pain in her feet.  She needs new diabetic shoes.  She is following with podiatry. She has tried braces for her feet and they have not helped. She is has pes planus and this  causes a lot of pain.   Medications and allergies reviewed with patient and updated if appropriate.  Patient Active Problem List   Diagnosis Date Noted  . Abnormality of gait 04/01/2013  . Posterior tibial tendon dysfunction 04/01/2013  . Type II or unspecified type diabetes mellitus with neurological manifestations, uncontrolled 04/01/2013  . Type I (juvenile type) diabetes mellitus without mention of complication, not stated as uncontrolled 10/20/2012  . Ankle pain, left 10/20/2010  . DEPRESSIVE DISORDER 04/28/2009  . PALPITATIONS 04/28/2009  . CHEST PAIN 04/28/2009  . UNSPECIFIED VITAMIN D DEFICIENCY 11/08/2008  . STIFFNESS 11/08/2008  . EDEMA- LOCALIZED 11/08/2008  . POSTMENOPAUSAL SYNDROME 08/12/2008  . SICKLE-CELL TRAIT 01/01/2008  . UNSPECIFIED ANEMIA 01/01/2008  . ANKLE PAIN, LEFT 01/01/2008  . ESOPHAGEAL REFLUX 09/02/2007  . GOITER, MULTINODULAR 11/26/2006  . Coronary atherosclerosis 11/26/2006  . HIATAL HERNIA 11/26/2006  . FIBROCYSTIC BREAST DISEASE 07/31/2006  . HYPERCHOLESTEROLEMIA 05/27/2006  . HYPERTENSION 05/27/2006    Current Outpatient Prescriptions on File Prior to Visit  Medication Sig Dispense Refill  . ACCU-CHEK FASTCLIX LANCETS MISC Use to test two times daily 204 each 11  . ACCU-CHEK SMARTVIEW test strip Use 1 two times daily as  instructed 200 each 11  . Alcohol Swabs (B-D SINGLE USE SWABS REGULAR) PADS     . amLODipine (NORVASC) 10 MG tablet Take 1 tablet by mouth     every day 90 tablet  0  . Ascorbic Acid (VITAMIN C) 500 MG tablet Take 500 mg by mouth 2 (two) times daily.      Marland Kitchen aspirin 81 MG tablet Take 81 mg by mouth daily.      . Blood Glucose Monitoring Suppl (ACCU-CHEK NANO SMARTVIEW) W/DEVICE KIT 1 Device by Does not apply route once. 1 kit 0  . calcium carbonate (OS-CAL) 600 MG TABS Take 600 mg by mouth daily.     . Cholecalciferol (VITAMIN D3) 5000 UNITS CAPS Take 5,000 Units by mouth daily.      Marland Kitchen Cod Liver Oil 1000 MG CAPS Take 1,000 mg  by mouth daily. During the winter    . hydrochlorothiazide (MICROZIDE) 12.5 MG capsule Take 1 capsule (12.5 mg total) by mouth daily as needed. 90 capsule 1  . insulin NPH-regular (NOVOLIN 70/30 RELION) (70-30) 100 UNIT/ML injection 15 units with breakfast, and 5 units with the evening meal    . isosorbide mononitrate (IMDUR) 30 MG 24 hr tablet Take 1 tablet by mouth  every day 90 tablet 0  . LORazepam (ATIVAN) 0.5 MG tablet TAKE ONE TABLET BY MOUTH EVERY 6 HOURS AS NEEDED FOR ANXIETY 30 tablet 0  . meclizine (ANTIVERT) 25 MG tablet Take 1/2 to 1 tab three times a day as needed 15 tablet 0  . meloxicam (MOBIC) 15 MG tablet Take 15 mg by mouth daily.    . Multiple Vitamin (MULTIVITAMIN) tablet Take 1 tablet by mouth daily.      . nitroGLYCERIN (NITROSTAT) 0.4 MG SL tablet DISSOLVE ONE TABLET UNDER TONGUE AS DIRECTED FOR CHEST PAIN 25 tablet 5  . omeprazole (PRILOSEC) 20 MG capsule Take 1 capsule by mouth    every morning 90 capsule 0  . quinapril (ACCUPRIL) 40 MG tablet Take 1 tablet by mouth    every day 90 tablet 0  . traMADol (ULTRAM) 50 MG tablet Take by mouth every 6 (six) hours as needed.    . vitamin E (VITAMIN E) 400 UNIT capsule Take 400 Units by mouth daily.       No current facility-administered medications on file prior to visit.    Past Medical History  Diagnosis Date  . CAD (coronary artery disease)   . Hypercholesteremia   . Hypertension   . Sickle-cell trait ([place])   . Anemia, unspecified   . Ankle pain, left   . Esophageal reflux   . Multinodular goiter   . Hiatal hernia   . IDDM (insulin dependent diabetes mellitus) ([place])     Type II  . Fibrocystic breast disease     Past Surgical History  Procedure Laterality Date  . Coronary angioplasty    . Biopsy thyroid      Dr.Ellison-Neg   . Retinal laser procedure    . Foot surgery  2004  . Back surgery  1990  . "female"  37    Social History   Social History  . Marital Status: Widowed    Spouse Name: N/A  .  Number of Children: N/A  . Years of Education: N/A   Social History Main Topics  . Smoking status: Never Smoker   . Smokeless tobacco: Never Used  . Alcohol Use: No  . Drug Use: No  . Sexual Activity: Not on file   Other Topics Concern  . Not on file   Social History Narrative   Widowed   Gets regular exercise    Family History  Problem Relation Age of Onset  . Hypertension    .  Lung cancer    . Heart disease    . Gout Sister   . Gout Brother   . Heart attack Father   . Heart attack Mother   . Gout Brother     Review of Systems  Constitutional: Negative for fever and chills.  Respiratory: Negative for cough, shortness of breath and wheezing.   Cardiovascular: Positive for leg swelling (intermittent  -takes hctz prn). Negative for palpitations.  Gastrointestinal: Negative for abdominal pain.       Gerd controlled  Neurological: Negative for dizziness, light-headedness and headaches.       Poor balance - uses cane  Psychiatric/Behavioral: Positive for sleep disturbance (occasional). Negative for dysphoric mood. The patient is nervous/anxious (occasional).        Objective:   Filed Vitals:   07/14/15 1003  BP: 140/74  Pulse: 60  Temp: 97.9 F (36.6 C)  Resp: 16   Filed Weights   07/14/15 1003  Weight: 205 lb (92.987 kg)   Body mass index is 37.49 kg/(m^2).   Physical Exam Constitutional: She appears well-developed and well-nourished. No distress.  HENT:  Head: Normocephalic and atraumatic.  Right Ear: External ear normal. Normal ear canal and TM Left Ear: External ear normal.  Normal ear canal and TM Mouth/Throat: Oropharynx is clear and moist.  Eyes: Conjunctivae and EOM are normal.  Neck: Neck supple. No tracheal deviation present. No thyromegaly present.  No carotid bruit  Cardiovascular: Normal rate, regular rhythm and normal heart sounds.   No murmur heard.  mild bilateral ankle edema. Pulmonary/Chest: Effort normal and breath sounds normal. No  respiratory distress. She has no wheezes. She has no rales.  Abdominal: Soft. She exhibits no distension. There is no tenderness.  Lymphadenopathy: She has no cervical adenopathy.  Feet:  Normal pulses bilateral feet, small calluses right foot-bottom of third toe, left foot with pes planus, slight hammer toe bilaterally-second toe; swelling bilateral ankles Skin: Skin is warm and dry. She is not diaphoretic.  Psychiatric: She has a normal mood and affect. Her behavior is normal.          Assessment & Plan:   See Problem List for Assessment and Plan of chronic medical problems.

## 2015-07-15 ENCOUNTER — Encounter: Payer: Self-pay | Admitting: Emergency Medicine

## 2015-07-25 ENCOUNTER — Ambulatory Visit (INDEPENDENT_AMBULATORY_CARE_PROVIDER_SITE_OTHER): Payer: Medicare HMO | Admitting: Ophthalmology

## 2015-07-29 NOTE — Addendum Note (Signed)
Addended by: Radford PaxAIRRIKIER DAVIDSON, Florence Yeung M on: 07/29/2015 04:06 PM   Modules accepted: Orders

## 2015-08-02 ENCOUNTER — Telehealth: Payer: Self-pay | Admitting: Internal Medicine

## 2015-08-02 NOTE — Telephone Encounter (Signed)
Patient called to advise that the diabetic shoes she has been trying to get have not been called to the provider.  Her name is Rebecca PlowmanJackie [name]  Ph# [phone] i was unable to find an actual physical location for her, and the patient is not aware of where her office is. Please follow up

## 2015-08-02 NOTE — Telephone Encounter (Signed)
Spoke with pt to inform all documentation was sent over via fax yesterday.

## 2015-08-15 ENCOUNTER — Other Ambulatory Visit: Payer: Self-pay | Admitting: *Deleted

## 2015-08-15 MED ORDER — OMEPRAZOLE 20 MG PO CPDR: DELAYED_RELEASE_CAPSULE | ORAL | Status: DC

## 2015-08-15 MED ORDER — QUINAPRIL HCL 40 MG PO TABS: ORAL_TABLET | ORAL | Status: DC

## 2015-08-15 MED ORDER — ISOSORBIDE MONONITRATE ER 30 MG PO TB24: ORAL_TABLET | ORAL | Status: DC

## 2015-08-15 MED ORDER — AMLODIPINE BESYLATE 10 MG PO TABS: ORAL_TABLET | ORAL | Status: DC

## 2015-08-15 NOTE — Telephone Encounter (Signed)
Receive call pt states she saw MD couple weeks ago was told refill would be sent to pharmacy, but they never was. Verified medications needed inform will send to [facility]...Raechel Chute/lmb

## 2015-08-22 ENCOUNTER — Telehealth: Payer: Self-pay | Admitting: *Deleted

## 2015-08-22 MED ORDER — MELOXICAM 15 MG PO TABS: 15.0000 mg | ORAL_TABLET | Freq: Every day | ORAL | Status: DC

## 2015-08-22 MED ORDER — TRAMADOL HCL 50 MG PO TABS: 50.0000 mg | ORAL_TABLET | Freq: Four times a day (QID) | ORAL | Status: DC | PRN

## 2015-08-22 NOTE — Telephone Encounter (Signed)
I do see that he prescribed it.  I will prescribe 30 pills - she should use only as needed.

## 2015-08-22 NOTE — Telephone Encounter (Signed)
Who has been prescribing this for her for the past two years?

## 2015-08-22 NOTE — Telephone Encounter (Signed)
Called pt no answer LMOM RTC.../lmb 

## 2015-08-22 NOTE — Telephone Encounter (Signed)
Patient called in to advise that her previous provider, Fleet Contrasedwin avbuere, MD was the last to prescribe the tramadol

## 2015-08-22 NOTE — Telephone Encounter (Signed)
RX fxed to [facility]

## 2015-08-22 NOTE — Telephone Encounter (Signed)
Left msg on triage requesting refills on her Meloxicam & Tramadol.semt meloxicam pls advise on Tramadol.....Raechel Chute/lmb

## 2015-08-24 ENCOUNTER — Ambulatory Visit (INDEPENDENT_AMBULATORY_CARE_PROVIDER_SITE_OTHER): Payer: PPO | Admitting: Ophthalmology

## 2015-08-24 DIAGNOSIS — H43813 Vitreous degeneration, bilateral: Secondary | ICD-10-CM

## 2015-08-24 DIAGNOSIS — H35033 Hypertensive retinopathy, bilateral: Secondary | ICD-10-CM | POA: Diagnosis not present

## 2015-08-24 DIAGNOSIS — E113593 Type 2 diabetes mellitus with proliferative diabetic retinopathy without macular edema, bilateral: Secondary | ICD-10-CM

## 2015-08-24 DIAGNOSIS — I10 Essential (primary) hypertension: Secondary | ICD-10-CM | POA: Diagnosis not present

## 2015-08-24 DIAGNOSIS — E11319 Type 2 diabetes mellitus with unspecified diabetic retinopathy without macular edema: Secondary | ICD-10-CM

## 2015-09-22 ENCOUNTER — Encounter (INDEPENDENT_AMBULATORY_CARE_PROVIDER_SITE_OTHER): Payer: PPO | Admitting: Ophthalmology

## 2015-09-22 DIAGNOSIS — E11311 Type 2 diabetes mellitus with unspecified diabetic retinopathy with macular edema: Secondary | ICD-10-CM | POA: Diagnosis not present

## 2015-09-22 DIAGNOSIS — E113592 Type 2 diabetes mellitus with proliferative diabetic retinopathy without macular edema, left eye: Secondary | ICD-10-CM

## 2015-10-23 NOTE — Progress Notes (Signed)
Subjective:    Patient ID: Rebecca [name], female    DOB: January 17, 1933, 81 y.o.   MRN: [id number]  HPI She is here for follow up.  Hypertension, leg edema: She is taking her medication daily, except the hctz which she takes as needed for leg swelling. She is compliant with a low sodium diet.  She denies chest pain, palpitations, shortness of breath and regular headaches. She is exercising.  She does monitor her blood pressure at the drug store and it is usually better - 120/60.    Diabetes with neuropathy: She is taking her medication daily as prescribed. She is compliant with a diabetic diet. She is exercising minimally - stretching and bed exercises. She monitors her sugars and they have been running 80-130 if she eats at home, if she eats out she gets higher numbers - sometimes 200's.  She does not eat out often. She checks her feet daily and denies foot lesions. She does have numbness/tingling intermittently in her feet.  She is up-to-date with an ophthalmology examination.   Hyperlipidemia: She is taking her medication daily. She is compliant with a low fat/cholesterol diet. She is exercising As above. She denies myalgias.   GERD:  She is taking her medication daily as prescribed.  She denies any GERD symptoms and feels her GERD is well controlled.   Anxiety, insomnia:  She takes ativan as needed only - she seldom takes it.   Chronic pain:  She can have pain at an intensity of 10/10.  She has pain in her feet, right knee, lower back and sometimes her right shoulder.  She does not take the meloxicam and tramadol often because it does not work well. She wonders about a fentanyl patch.  She is currently using a cane to help her ambulate. Her balance is not good.  Medications and allergies reviewed with patient and updated if appropriate.  Patient Active Problem List   Diagnosis Date Noted  . Abnormality of gait 04/01/2013  . Posterior tibial tendon dysfunction 04/01/2013  .  Diabetes ([place]) 04/01/2013  . Ankle pain, left 10/20/2010  . PALPITATIONS 04/28/2009  . UNSPECIFIED VITAMIN D DEFICIENCY 11/08/2008  . EDEMA- LOCALIZED 11/08/2008  . SICKLE-CELL TRAIT 01/01/2008  . UNSPECIFIED ANEMIA 01/01/2008  . ESOPHAGEAL REFLUX 09/02/2007  . GOITER, MULTINODULAR 11/26/2006  . Coronary atherosclerosis 11/26/2006  . HIATAL HERNIA 11/26/2006  . FIBROCYSTIC BREAST DISEASE 07/31/2006  . HYPERCHOLESTEROLEMIA 05/27/2006  . Essential hypertension 05/27/2006    Current Outpatient Prescriptions on File Prior to Visit  Medication Sig Dispense Refill  . ACCU-CHEK FASTCLIX LANCETS MISC Use to test two times daily 204 each 11  . ACCU-CHEK SMARTVIEW test strip Use 1 two times daily as  instructed 200 each 11  . Alcohol Swabs (B-D SINGLE USE SWABS REGULAR) PADS     . amLODipine (NORVASC) 10 MG tablet Take 1 tablet by mouth  every day 90 tablet 3  . Ascorbic Acid (VITAMIN C) 500 MG tablet Take 500 mg by mouth 2 (two) times daily.      Marland Kitchen aspirin 81 MG tablet Take 81 mg by mouth daily.      Marland Kitchen atorvastatin (LIPITOR) 10 MG tablet Take 10 mg by mouth daily.    . Blood Glucose Monitoring Suppl (ACCU-CHEK NANO SMARTVIEW) W/DEVICE KIT 1 Device by Does not apply route once. 1 kit 0  . calcium carbonate (OS-CAL) 600 MG TABS Take 600 mg by mouth daily.     . Cholecalciferol (VITAMIN D3) 5000 UNITS  CAPS Take 5,000 Units by mouth daily.      Marland Kitchen Cod Liver Oil 1000 MG CAPS Take 1,000 mg by mouth daily. During the winter    . hydrochlorothiazide (MICROZIDE) 12.5 MG capsule Take 1 capsule (12.5 mg total) by mouth daily as needed. 90 capsule 1  . insulin NPH-regular (NOVOLIN 70/30 RELION) (70-30) 100 UNIT/ML injection 15 units with breakfast, and 5 units with the evening meal    . isosorbide mononitrate (IMDUR) 30 MG 24 hr tablet Take 1 tablet by mouth  every day 90 tablet 3  . LORazepam (ATIVAN) 0.5 MG tablet TAKE ONE TABLET BY MOUTH EVERY 6 HOURS AS NEEDED FOR ANXIETY 30 tablet 0  . meclizine  (ANTIVERT) 25 MG tablet Take 1/2 to 1 tab three times a day as needed 15 tablet 0  . meloxicam (MOBIC) 15 MG tablet Take 1 tablet (15 mg total) by mouth daily. 90 tablet 1  . Multiple Vitamin (MULTIVITAMIN) tablet Take 1 tablet by mouth daily.      . nitroGLYCERIN (NITROSTAT) 0.4 MG SL tablet DISSOLVE ONE TABLET UNDER TONGUE AS DIRECTED FOR CHEST PAIN 25 tablet 5  . omeprazole (PRILOSEC) 20 MG capsule Take 1 capsule by mouth    every morning 90 capsule 3  . quinapril (ACCUPRIL) 40 MG tablet Take 1 tablet by mouth    every day 90 tablet 3  . traMADol (ULTRAM) 50 MG tablet Take 1 tablet (50 mg total) by mouth every 6 (six) hours as needed. 30 tablet 0  . vitamin E (VITAMIN E) 400 UNIT capsule Take 400 Units by mouth daily.       No current facility-administered medications on file prior to visit.     Past Medical History:  Diagnosis Date  . Anemia, unspecified   . Ankle pain, left   . CAD (coronary artery disease)   . Esophageal reflux   . Fibrocystic breast disease   . Hiatal hernia   . Hypercholesteremia   . Hypertension   . IDDM (insulin dependent diabetes mellitus) ([place])    Type II  . Multinodular goiter   . Sickle-cell trait [facility])     Past Surgical History:  Procedure Laterality Date  . "FEMALE"  1976  . BACK SURGERY  1990  . BIOPSY THYROID     Dr.Ellison-Neg   . CORONARY ANGIOPLASTY    . FOOT SURGERY  2004  . RETINAL LASER PROCEDURE      Social History   Social History  . Marital status: Widowed    Spouse name: N/A  . Number of children: N/A  . Years of education: N/A   Social History Main Topics  . Smoking status: Never Smoker  . Smokeless tobacco: Never Used  . Alcohol use No  . Drug use: No  . Sexual activity: Not Asked   Other Topics Concern  . None   Social History Narrative   Widowed   Gets regular exercise    Family History  Problem Relation Age of Onset  . Hypertension    . Lung cancer    . Heart disease    . Gout Sister   . Gout Brother    . Heart attack Father   . Heart attack Mother   . Gout Brother     Review of Systems  Constitutional: Negative for fever.  Respiratory: Negative for cough, shortness of breath and wheezing.   Cardiovascular: Positive for leg swelling. Negative for chest pain and palpitations.  Musculoskeletal: Positive for arthralgias, back pain  and gait problem.  Neurological: Positive for light-headedness (if sugar is elevated). Negative for headaches.       Objective:   Vitals:   10/24/15 1045  BP: (!) 164/72  Pulse: 74  Resp: 18  Temp: 98.1 F (36.7 C)   Filed Weights   10/24/15 1045  Weight: 198 lb (89.8 kg)   Body mass index is 36.21 kg/m.   Physical Exam Constitutional: Appears well-developed and well-nourished. No distress.  HENT:  Head: Normocephalic and atraumatic.  Neck: Neck supple. No tracheal deviation present. No thyromegaly present.  Cardiovascular: Normal rate, regular rhythm and normal heart sounds.   No murmur heard. No carotid bruit  Pulmonary/Chest: Effort normal and breath sounds normal. No respiratory distress. No has no wheezes. No rales.  Musculoskeletal: No edema.  Lymphadenopathy: No cervical adenopathy.  Skin: Skin is warm and dry. Not diaphoretic.  Psychiatric: Normal mood and affect. Behavior is normal.         Assessment & Plan:   See Problem List for Assessment and Plan of chronic medical problems.

## 2015-10-24 ENCOUNTER — Ambulatory Visit (INDEPENDENT_AMBULATORY_CARE_PROVIDER_SITE_OTHER): Payer: PPO | Admitting: Internal Medicine

## 2015-10-24 ENCOUNTER — Other Ambulatory Visit (INDEPENDENT_AMBULATORY_CARE_PROVIDER_SITE_OTHER): Payer: PPO

## 2015-10-24 ENCOUNTER — Encounter: Payer: Self-pay | Admitting: Internal Medicine

## 2015-10-24 VITALS — BP 164/72 | HR 74 | Temp 98.1°F | Resp 18 | Ht 62.0 in | Wt 198.0 lb

## 2015-10-24 DIAGNOSIS — E7800 Pure hypercholesterolemia, unspecified: Secondary | ICD-10-CM

## 2015-10-24 DIAGNOSIS — Z794 Long term (current) use of insulin: Secondary | ICD-10-CM

## 2015-10-24 DIAGNOSIS — R269 Unspecified abnormalities of gait and mobility: Secondary | ICD-10-CM

## 2015-10-24 DIAGNOSIS — E1142 Type 2 diabetes mellitus with diabetic polyneuropathy: Secondary | ICD-10-CM

## 2015-10-24 DIAGNOSIS — M79671 Pain in right foot: Secondary | ICD-10-CM

## 2015-10-24 DIAGNOSIS — K219 Gastro-esophageal reflux disease without esophagitis: Secondary | ICD-10-CM | POA: Diagnosis not present

## 2015-10-24 DIAGNOSIS — I10 Essential (primary) hypertension: Secondary | ICD-10-CM

## 2015-10-24 DIAGNOSIS — G8929 Other chronic pain: Secondary | ICD-10-CM | POA: Insufficient documentation

## 2015-10-24 DIAGNOSIS — M545 Low back pain, unspecified: Secondary | ICD-10-CM | POA: Insufficient documentation

## 2015-10-24 DIAGNOSIS — M79672 Pain in left foot: Secondary | ICD-10-CM

## 2015-10-24 DIAGNOSIS — R600 Localized edema: Secondary | ICD-10-CM

## 2015-10-24 DIAGNOSIS — D573 Sickle-cell trait: Secondary | ICD-10-CM

## 2015-10-24 LAB — COMPREHENSIVE METABOLIC PANEL WITH GFR
ALT: 13 U/L (ref 0–35)
AST: 14 U/L (ref 0–37)
Albumin: 3.9 g/dL (ref 3.5–5.2)
Alkaline Phosphatase: 50 U/L (ref 39–117)
BUN: 26 mg/dL — ABNORMAL HIGH (ref 6–23)
CO2: 29 meq/L (ref 19–32)
Calcium: 9.7 mg/dL (ref 8.4–10.5)
Chloride: 104 meq/L (ref 96–112)
Creatinine, Ser: 1.12 mg/dL (ref 0.40–1.20)
GFR: 59.99 mL/min — ABNORMAL LOW
Glucose, Bld: 139 mg/dL — ABNORMAL HIGH (ref 70–99)
Potassium: 4.8 meq/L (ref 3.5–5.1)
Sodium: 139 meq/L (ref 135–145)
Total Bilirubin: 0.4 mg/dL (ref 0.2–1.2)
Total Protein: 6.9 g/dL (ref 6.0–8.3)

## 2015-10-24 LAB — MICROALBUMIN / CREATININE URINE RATIO
Creatinine,U: 51.4 mg/dL
Microalb Creat Ratio: 3.7 mg/g (ref 0.0–30.0)
Microalb, Ur: 1.9 mg/dL (ref 0.0–1.9)

## 2015-10-24 LAB — HEMOGLOBIN A1C: Hgb A1c MFr Bld: 7.9 % — ABNORMAL HIGH (ref 4.6–6.5)

## 2015-10-24 MED ORDER — ROLLER WALKER MISC: 0 refills | Status: DC

## 2015-10-24 NOTE — Patient Instructions (Addendum)
  Test(s) ordered today. Your results will be released to MyChart (or called to you) after review, usually within 72hours after test completion. If any changes need to be made, you will be notified at that same time.  All other Health Maintenance issues reviewed.   All recommended immunizations and age-appropriate screenings are up-to-date or discussed.  No immunizations administered today.   Medications reviewed and updated.  No changes recommended at this time.  A referral was ordered for pain management  Please followup in 6 months

## 2015-10-24 NOTE — Assessment & Plan Note (Signed)
Blood pressure well-controlled at her pharmacy, but seems to be higher here Takes hydrochlorothiazide only as needed Check CMP Stressed the importance of monitoring blood pressure regularly to confirm that it is well controlled

## 2015-10-24 NOTE — Assessment & Plan Note (Signed)
Diabetes has been well-controlled Check A1c, urine microalbumin

## 2015-10-24 NOTE — Assessment & Plan Note (Signed)
Controlled with taking hydrochlorothiazide as needed CMP today

## 2015-10-24 NOTE — Assessment & Plan Note (Signed)
Using a cane to ambulate Discussed possible walker-prescription given She would benefit from a walker given her poor balance, severe pes planus and chronic pain

## 2015-10-24 NOTE — Assessment & Plan Note (Addendum)
Chronic pain Takes tramadol on occasion - it does not help much Not taking meloxicam regularly because it also does not help Interested in different pain medication-we will refer to pain clinic for further evaluation of lower back pain and chronic foot pain

## 2015-10-24 NOTE — Progress Notes (Signed)
Pre visit review using our clinic review tool, if applicable. No additional management support is needed unless otherwise documented below in the visit note. 

## 2015-10-24 NOTE — Assessment & Plan Note (Signed)
GERD controlled Continue daily medication  

## 2015-10-24 NOTE — Assessment & Plan Note (Signed)
Last CBC within normal limits 3 months ago Will monitor CBC periodically

## 2015-10-27 ENCOUNTER — Encounter: Payer: Self-pay | Admitting: Internal Medicine

## 2015-10-27 DIAGNOSIS — N189 Chronic kidney disease, unspecified: Secondary | ICD-10-CM | POA: Insufficient documentation

## 2015-11-18 DIAGNOSIS — G8929 Other chronic pain: Secondary | ICD-10-CM | POA: Diagnosis not present

## 2015-11-18 DIAGNOSIS — M545 Low back pain: Secondary | ICD-10-CM | POA: Diagnosis not present

## 2015-11-18 DIAGNOSIS — R269 Unspecified abnormalities of gait and mobility: Secondary | ICD-10-CM | POA: Diagnosis not present

## 2015-12-19 DIAGNOSIS — H401123 Primary open-angle glaucoma, left eye, severe stage: Secondary | ICD-10-CM | POA: Diagnosis not present

## 2015-12-19 DIAGNOSIS — H40053 Ocular hypertension, bilateral: Secondary | ICD-10-CM | POA: Diagnosis not present

## 2015-12-19 DIAGNOSIS — H401132 Primary open-angle glaucoma, bilateral, moderate stage: Secondary | ICD-10-CM | POA: Diagnosis not present

## 2015-12-22 ENCOUNTER — Ambulatory Visit: Payer: PPO | Admitting: Physical Medicine & Rehabilitation

## 2015-12-28 ENCOUNTER — Encounter: Payer: Self-pay | Admitting: Physical Medicine & Rehabilitation

## 2015-12-28 ENCOUNTER — Encounter: Payer: PPO | Attending: Physical Medicine & Rehabilitation | Admitting: Physical Medicine & Rehabilitation

## 2015-12-28 ENCOUNTER — Ambulatory Visit (HOSPITAL_COMMUNITY)
Admission: RE | Admit: 2015-12-28 | Discharge: 2015-12-28 | Disposition: A | Discharge status: Home / Self Care | Payer: PPO | Source: Ambulatory Visit | Attending: Physical Medicine & Rehabilitation | Admitting: Physical Medicine & Rehabilitation

## 2015-12-28 VITALS — BP 137/68 | HR 60 | Resp 60

## 2015-12-28 DIAGNOSIS — M25561 Pain in right knee: Secondary | ICD-10-CM

## 2015-12-28 DIAGNOSIS — Z5181 Encounter for therapeutic drug level monitoring: Secondary | ICD-10-CM

## 2015-12-28 DIAGNOSIS — E1140 Type 2 diabetes mellitus with diabetic neuropathy, unspecified: Secondary | ICD-10-CM | POA: Insufficient documentation

## 2015-12-28 DIAGNOSIS — I10 Essential (primary) hypertension: Secondary | ICD-10-CM | POA: Insufficient documentation

## 2015-12-28 DIAGNOSIS — R269 Unspecified abnormalities of gait and mobility: Secondary | ICD-10-CM

## 2015-12-28 DIAGNOSIS — G8929 Other chronic pain: Secondary | ICD-10-CM

## 2015-12-28 DIAGNOSIS — M1711 Unilateral primary osteoarthritis, right knee: Secondary | ICD-10-CM | POA: Insufficient documentation

## 2015-12-28 DIAGNOSIS — M961 Postlaminectomy syndrome, not elsewhere classified: Secondary | ICD-10-CM | POA: Diagnosis not present

## 2015-12-28 DIAGNOSIS — M545 Low back pain, unspecified: Secondary | ICD-10-CM

## 2015-12-28 DIAGNOSIS — G479 Sleep disorder, unspecified: Secondary | ICD-10-CM | POA: Insufficient documentation

## 2015-12-28 DIAGNOSIS — D573 Sickle-cell trait: Secondary | ICD-10-CM | POA: Insufficient documentation

## 2015-12-28 DIAGNOSIS — I2510 Atherosclerotic heart disease of native coronary artery without angina pectoris: Secondary | ICD-10-CM | POA: Diagnosis not present

## 2015-12-28 DIAGNOSIS — S8991XA Unspecified injury of right lower leg, initial encounter: Secondary | ICD-10-CM | POA: Diagnosis not present

## 2015-12-28 DIAGNOSIS — Z79899 Other long term (current) drug therapy: Secondary | ICD-10-CM

## 2015-12-28 DIAGNOSIS — M25562 Pain in left knee: Secondary | ICD-10-CM | POA: Diagnosis not present

## 2015-12-28 MED ORDER — DICLOFENAC SODIUM 1 % TD GEL: 2.0000 g | Freq: Four times a day (QID) | TRANSDERMAL | 0 refills | Status: DC

## 2015-12-28 MED ORDER — LIDOCAINE 5 % EX PTCH: 1.0000 | MEDICATED_PATCH | CUTANEOUS | 1 refills | Status: DC

## 2015-12-28 MED ORDER — METHOCARBAMOL 500 MG PO TABS: 500.0000 mg | ORAL_TABLET | Freq: Two times a day (BID) | ORAL | 1 refills | Status: DC | PRN

## 2015-12-28 NOTE — Addendum Note (Signed)
Addended by: Angela NevinWESSLING, Nikos Anglemyer D on: 12/28/2015 12:52 PM   Modules accepted: Orders

## 2015-12-28 NOTE — Progress Notes (Signed)
Subjective:    Patient ID: Rebecca [name], female    DOB: 1932/08/15, 82 y.o.   MRN: [id number]  HPI 82 y/o female with pmh/psh of DM with neuropathy, sickle cell trait, HTN, CAD, back surgery, ankle surgery present for evaluation of back pain. Pt had back surgery in 1991, no inciting event.  Pt has been persistent.  Located in lower back.  Stabbing, achy pain.  Rest improves the pain.  Ambulation exacerbates.  Non-radiating.  Intermittent.  Meloxican helps.  Tramadol does not help.  Denies associated numbness/tingling and weakness.  Denies falls. Pain limits ambulation and ADLs.    Pain Inventory Average Pain 5 Pain Right Now 5 My pain is stabbing and aching  In the last 24 hours, has pain interfered with the following? General activity 7 Relation with others 10 Enjoyment of life 10 What TIME of day is your pain at its worst? morning, evening  Sleep (in general) Fair  Pain is worse with: walking, bending and standing Pain improves with: rest, heat/ice, therapy/exercise and medication Relief from Meds: 1  Mobility walk without assistance walk with assistance use a cane use a walker how many minutes can you walk? 5 ability to climb steps?  no do you drive?  yes Do you have any goals in this area?  yes  Function not employed: date last employed . disabled: date disabled . retired I need assistance with the following:  meal prep, household duties and shopping Do you have any goals in this area?  yes  Neuro/Psych weakness numbness tingling trouble walking anxiety  Prior Studies new visit  Physicians involved in your care new visit   Family History  Problem Relation Age of Onset  . Hypertension    . Lung cancer    . Heart disease    . Gout Sister   . Gout Brother   . Heart attack Father   . Heart attack Mother   . Gout Brother    Social History   Social History  . Marital status: Widowed    Spouse name: N/A  . Number of children: N/A  . Years  of education: N/A   Social History Main Topics  . Smoking status: Never Smoker  . Smokeless tobacco: Never Used  . Alcohol use No  . Drug use: No  . Sexual activity: Not Asked   Other Topics Concern  . None   Social History Narrative   Widowed   Gets regular exercise   Past Surgical History:  Procedure Laterality Date  . "FEMALE"  1976  . BACK SURGERY  1990  . BIOPSY THYROID     Dr.Ellison-Neg   . CORONARY ANGIOPLASTY    . FOOT SURGERY  2004  . RETINAL LASER PROCEDURE     Past Medical History:  Diagnosis Date  . Anemia, unspecified   . Ankle pain, left   . CAD (coronary artery disease)   . Esophageal reflux   . Fibrocystic breast disease   . Hiatal hernia   . Hypercholesteremia   . Hypertension   . IDDM (insulin dependent diabetes mellitus) (HCC)    Type II  . Multinodular goiter   . Sickle-cell trait (HCC)    BP 137/68 (BP Location: Right Arm, Patient Position: Sitting, Cuff Size: Large)   Pulse 60   Resp (!) 60   SpO2 95%   Opioid Risk Score:   Fall Risk Score:  `1  Depression screen PHQ 2/9  Depression screen [facility] 12/28/2015 10/24/2015 04/01/2013  Decreased Interest 0 0 0  Down, Depressed, Hopeless 0 0 0  PHQ - 2 Score 0 0 0  Altered sleeping 1 - -  Tired, decreased energy 1 - -  Change in appetite 0 - -  Feeling bad or failure about yourself  0 - -  Trouble concentrating 0 - -  Moving slowly or fidgety/restless 0 - -  Suicidal thoughts 0 - -  PHQ-9 Score 2 - -  Difficult doing work/chores Not difficult at all - -    Review of Systems  Constitutional: Positive for diaphoresis.  HENT: Negative.   Respiratory: Positive for apnea and shortness of breath.   Cardiovascular: Positive for leg swelling.  Endocrine:       Diabetes  Musculoskeletal: Positive for arthralgias, back pain and gait problem.  Allergic/Immunologic: Negative.   Neurological: Positive for weakness and numbness.       Tingling  Hematological: Bruises/bleeds easily.    Psychiatric/Behavioral: The patient is nervous/anxious.   All other systems reviewed and are negative.      Objective:   Physical Exam  Gen: NAD. Vital signs reviewed HENT: Normocephalic, Atraumatic Eyes: EOMI, Conj WNL Cardio: S1, S2 normal, RRR Pulm: B/l clear to auscultation.  Effort normal Abd: Soft, non-distended, non-tender, BS+ MSK:  Gait antalgic with kyphotic posture.   TTP b/l sacrum  No edema.  Neuro: CN II-XII grossly intact.    Sensation intact to light touch in LE dermatomes  Reflexes 1+ throughout  Strength  4+/5 (pain inhibition) in all RLE myotomes    5/5 in all LLE myotomes  Negative seated SLR b/l Skin: Warm and Dry    Assessment & Plan:  82 y/o female with pmh/psh of DM with neuropathy, sickle cell trait, HTN, CAD, back surgery, ankle surgery present for evaluation of back pain.   1. Chronic mechanical low back pain/Failed back syndrome  Meloxicam d/ced due to CAD  Cont back brace during increased exertion  Pt has had benefit from patched before, will order Lidoderm patches  Will refer to PT for postural exercises/core strengthing, TENS eval and height adjustment of assistive devices. - pt has limited resources and can only go 1 time  Will order Voltaren gel  Will order Robaxin 500 BID PRN  2. Sleep disturbance  Will order Robaxin 500 BID PRN  Will consider Gabapentin in future  3. Gait abnormality  Cont rollator  Gait training with posture exercises referral to PT  4. Chronic left knee pain - stable  Xray ordered  Voltaren gel ordered  Will consider further workup in future

## 2015-12-29 ENCOUNTER — Encounter: Payer: Self-pay | Admitting: Physical Therapy

## 2015-12-29 ENCOUNTER — Ambulatory Visit: Payer: PPO | Attending: Physical Medicine & Rehabilitation | Admitting: Physical Therapy

## 2015-12-29 DIAGNOSIS — M6281 Muscle weakness (generalized): Secondary | ICD-10-CM | POA: Diagnosis present

## 2015-12-29 DIAGNOSIS — R262 Difficulty in walking, not elsewhere classified: Secondary | ICD-10-CM | POA: Insufficient documentation

## 2015-12-29 DIAGNOSIS — M545 Low back pain, unspecified: Secondary | ICD-10-CM

## 2015-12-29 DIAGNOSIS — G8929 Other chronic pain: Secondary | ICD-10-CM | POA: Insufficient documentation

## 2015-12-29 NOTE — Therapy (Signed)
[facility] [address] [place], [place], [address] Phone: [phone]   Fax:  [phone]  Physical Therapy Evaluation  Patient Details  Name: Rebecca [name] MRN: [id number] Date of Birth: September 25, 1932 Referring Provider: Dr  Corlis HoveAntik Patel  Encounter Date: 12/29/2015      PT End of Session - 12/29/15 1305    Visit Number 1   Number of Visits 16   Date for PT Re-Evaluation 02/23/16   Authorization Type medicare health net    PT Start Time 1104   PT Stop Time 1200   PT Time Calculation (min) 56 min   Activity Tolerance Patient tolerated treatment well   Behavior During Therapy [facility] for tasks assessed/performed      Past Medical History:  Diagnosis Date  . Anemia, unspecified   . Ankle pain, left   . CAD (coronary artery disease)   . Esophageal reflux   . Fibrocystic breast disease   . Hiatal hernia   . Hypercholesteremia   . Hypertension   . IDDM (insulin dependent diabetes mellitus) (HCC)    Type II  . Multinodular goiter   . Sickle-cell trait [facility](HCC)     Past Surgical History:  Procedure Laterality Date  . "FEMALE"  1976  . BACK SURGERY  1990  . BIOPSY THYROID     Dr.Ellison-Neg   . CORONARY ANGIOPLASTY    . FOOT SURGERY  2004  . RETINAL LASER PROCEDURE      There were no vitals filed for this visit.       Subjective Assessment - 12/29/15 1112    Subjective Patient has  long history of lower back pain. she had lumbar spine surgery in 1991. Since that point she has had intermittent pain. Over the past few months the pain has increased. She feels like she is having more difficulty perfroming ADL's. She was given a walker by her primary MD yesterday 2nd to hunching over when walking. She feels like she is having difficulty perfroming her house work.    How long can you sit comfortably? No limit    How long can you stand comfortably? <5 minutes    How long can you walk comfortably? Difficulty walking  around the store    Currently in Pain? Yes   Pain Score 5    Pain Location Back   Pain Orientation Right   Pain Descriptors / Indicators Aching   Pain Type Chronic pain   Pain Onset More than a month ago   Pain Frequency Intermittent   Aggravating Factors  walking, standing    Pain Relieving Factors rest    Effect of Pain on Daily Activities difficulty perfroming ADL's             [facility] - 12/29/15 0001      Assessment   Medical Diagnosis Low back pain    Referring Provider Dr  Corlis HoveAntik Patel   Onset Date/Surgical Date --  > 10 years    Hand Dominance Right   Next MD Visit 6 weeks    Prior Therapy none     Precautions   Precautions None     Restrictions   Weight Bearing Restrictions No     Balance Screen   Has the patient fallen in the past 6 months No     Home Environment   Living Environment Private residence   Living Arrangements Alone     Prior Function   Level of Independence Independent  Cognition   Overall Cognitive Status Within Functional Limits for tasks assessed     Observation/Other Assessments   Observations Flexed trunk in standing; left foot drop    Focus on Therapeutic Outcomes (FOTO)  1x visit      Sensation   Additional Comments No radicualr symtpoms      Coordination   Gross Motor Movements are Fluid and Coordinated Yes     Posture/Postural Control   Posture Comments Flexed trunk, rounded shoulders      ROM / Strength   AROM / PROM / Strength AROM;PROM;Strength     AROM   AROM Assessment Site Lumbar   Lumbar Flexion limited 25% with pain    Lumbar Extension limited 75% with pain   Lumbar - Right Side Bend  WFL    Lumbar - Left Side Bend WFL    Lumbar - Right Rotation WFL    Lumbar - Left Rotation WFL      PROM   Overall PROM Comments PROM of hips WNL      Strength   Strength Assessment Site Hip;Knee   Right/Left Hip Right;Left   Right Hip Flexion 4+/5   Right Hip ABduction 4+/5   Right Hip ADduction 4/5    Left Hip Flexion 4+/5   Left Hip ABduction 5/5   Left Hip ADduction 5/5     Palpation   Palpation comment moderate spamsing of lumbar spine      Ambulation/Gait   Gait Comments Ambualtes with a lflexed posture, Decreased hip flexion,                           PT Education - 12/29/15 1111    Education provided Yes   Education Details Cotninue with HEP    Person(s) Educated Patient   Methods Explanation;Demonstration;Verbal cues;Handout   Comprehension Verbalized understanding;Returned demonstration          PT Short Term Goals - 12/29/15 1315      PT SHORT TERM GOAL #1   Title Patient will dmeostrate ability to use TENS unit    Time 1   Period Days   Status Achieved     PT SHORT TERM GOAL #2   Title Patient will be independent with HEP    Time 1   Period Weeks   Status New                  Plan - 12/29/15 1306    Clinical Impression Statement Patient is a 82 year old female with chronic lower back pain. her pain has been increasing over time. She feels like her pain is effecting her ability to perfrom IADL's. She would only like to conme for a 1x visit 2nd to difficulty getting here and limited resources. She would benefit from a TENS unit. She was given a tens and educated on proper use. She was able to use the TENS after treatment. Her walker was raised and tennis balls were put on the bottom to improve her posture and her bability to mobve the walker. She was given an HEP for light strengthening and stretching.     Rehab Potential Good   PT Frequency 2x / week   PT Duration 8 weeks   PT Treatment/Interventions ADLs/Self Care Home Management;Cryotherapy;Moist Heat;Gait training;Functional mobility training;Electrical Stimulation;Patient/family education;Manual techniques;Taping;Splinting;Dry needling   PT Next Visit Plan 1x visit   PT Home Exercise Plan see instructions    Consulted and Agree with Plan of Care  Patient      Patient  will benefit from skilled therapeutic intervention in order to improve the following deficits and impairments:  Abnormal gait, Hypomobility, Hypermobility, Pain, Difficulty walking, Decreased safety awareness, Decreased activity tolerance, Decreased strength, Decreased mobility  Visit Diagnosis: Chronic right-sided low back pain without sciatica - Plan: PT plan of care cert/re-cert  Muscle weakness (generalized) - Plan: PT plan of care cert/re-cert  Difficulty in walking, not elsewhere classified - Plan: PT plan of care cert/re-cert      G-Codes - 2016-01-28 1323    Functional Limitation Mobility: Walking and moving around   Mobility: Walking and Moving Around Current Status [phone]) At least 20 percent but less than 40 percent impaired, limited or restricted   Mobility: Walking and Moving Around Goal Status [phone]) At least 20 percent but less than 40 percent impaired, limited or restricted   Mobility: Walking and Moving Around Discharge Status [phone]) At least 20 percent but less than 40 percent impaired, limited or restricted       Problem List Patient Active Problem List   Diagnosis Date Noted  . Chronic pain of right knee 12/28/2015  . CKD (chronic kidney disease) 10/27/2015  . Lower back pain 10/24/2015  . Pain in both feet 10/24/2015  . Abnormality of gait 04/01/2013  . Posterior tibial tendon dysfunction 04/01/2013  . Diabetes (HCC) 04/01/2013  . Ankle pain, left 10/20/2010  . PALPITATIONS 04/28/2009  . UNSPECIFIED VITAMIN D DEFICIENCY 11/08/2008  . EDEMA- LOCALIZED 11/08/2008  . Sickle-cell trait (HCC) 01/01/2008  . Esophageal reflux 09/02/2007  . GOITER, MULTINODULAR 11/26/2006  . Coronary atherosclerosis 11/26/2006  . HIATAL HERNIA 11/26/2006  . FIBROCYSTIC BREAST DISEASE 07/31/2006  . HYPERCHOLESTEROLEMIA 05/27/2006  . Essential hypertension 05/27/2006    Dessie Coma PT DPT  Jan 28, 2016, 1:28 PM  [facility] [address] [place], [place], [address] Phone: [phone]   Fax:  [phone]  Name: Mikhala Kenan RANNEY MRN: [id number] Date of Birth: 20-Oct-1932

## 2015-12-30 ENCOUNTER — Telehealth: Payer: Self-pay | Admitting: *Deleted

## 2015-12-30 NOTE — Telephone Encounter (Signed)
PA intiated for Lidoderm patch 5% and Methocarbamol via Cover My Meds

## 2016-01-04 LAB — TOXASSURE SELECT,+ANTIDEPR,UR

## 2016-01-04 NOTE — Progress Notes (Signed)
Urine drug screen for this encounter is consistent for prescribed medication 

## 2016-01-11 ENCOUNTER — Telehealth: Payer: Self-pay | Admitting: *Deleted

## 2016-01-11 NOTE — Telephone Encounter (Signed)
Calling about a medications she says the pharmacy has been trying to get from our office (I think a PA). The methocarbamol has been denied and she cannot afford the $32 for the medication because of all else she takes. She needs something else her insurance will cover.

## 2016-01-16 NOTE — Telephone Encounter (Signed)
I'm not sure what insurance she has, but we can try either 800mg  Skelaxin TID PRN or baclofen 10mg  TID PRN.  Thanks.

## 2016-01-17 MED ORDER — BACLOFEN 10 MG PO TABS: 10.0000 mg | ORAL_TABLET | Freq: Three times a day (TID) | ORAL | 0 refills | Status: DC

## 2016-01-17 NOTE — Telephone Encounter (Signed)
Sent Rx for baclofen to pt's preferred pharmacy, contacted pt, pt acknowledged, she will call us back if it is too much

## 2016-01-30 ENCOUNTER — Ambulatory Visit (INDEPENDENT_AMBULATORY_CARE_PROVIDER_SITE_OTHER): Payer: PPO | Admitting: Ophthalmology

## 2016-02-07 ENCOUNTER — Ambulatory Visit (INDEPENDENT_AMBULATORY_CARE_PROVIDER_SITE_OTHER): Payer: PPO

## 2016-02-07 DIAGNOSIS — Z23 Encounter for immunization: Secondary | ICD-10-CM | POA: Diagnosis not present

## 2016-02-08 ENCOUNTER — Encounter: Payer: PPO | Admitting: Physical Medicine & Rehabilitation

## 2016-02-24 ENCOUNTER — Encounter: Payer: PPO | Admitting: Physical Medicine & Rehabilitation

## 2016-02-27 ENCOUNTER — Ambulatory Visit (INDEPENDENT_AMBULATORY_CARE_PROVIDER_SITE_OTHER): Payer: PPO | Admitting: Ophthalmology

## 2016-03-16 ENCOUNTER — Ambulatory Visit (INDEPENDENT_AMBULATORY_CARE_PROVIDER_SITE_OTHER): Payer: PPO | Admitting: Ophthalmology

## 2016-04-02 ENCOUNTER — Other Ambulatory Visit: Payer: Self-pay | Admitting: Physical Medicine & Rehabilitation

## 2016-04-04 ENCOUNTER — Ambulatory Visit (INDEPENDENT_AMBULATORY_CARE_PROVIDER_SITE_OTHER): Payer: PPO | Admitting: Ophthalmology

## 2016-04-04 DIAGNOSIS — I10 Essential (primary) hypertension: Secondary | ICD-10-CM | POA: Diagnosis not present

## 2016-04-04 DIAGNOSIS — E113593 Type 2 diabetes mellitus with proliferative diabetic retinopathy without macular edema, bilateral: Secondary | ICD-10-CM | POA: Diagnosis not present

## 2016-04-04 DIAGNOSIS — E11319 Type 2 diabetes mellitus with unspecified diabetic retinopathy without macular edema: Secondary | ICD-10-CM

## 2016-04-04 DIAGNOSIS — H35033 Hypertensive retinopathy, bilateral: Secondary | ICD-10-CM

## 2016-04-04 DIAGNOSIS — H43813 Vitreous degeneration, bilateral: Secondary | ICD-10-CM | POA: Diagnosis not present

## 2016-04-22 NOTE — Patient Instructions (Addendum)
  Test(s) ordered today. Your results will be released to MyChart (or called to you) after review, usually within 72hours after test completion. If any changes need to be made, you will be notified at that same time.   No immunizations administered today.   Medications reviewed and updated.  Changes include stopping the insulin and starting metformin 500 mg twice a day.  We have also started gabapentin 100 mg at night for the nerve pain.   Your prescription(s) have been submitted to your pharmacy. Please take as directed and contact our office if you believe you are having problem(s) with the medication(s).   Please followup in 3 months

## 2016-04-22 NOTE — Progress Notes (Signed)
Subjective:    Patient ID: Rebecca [name], female    DOB: 1932/07/31, 83 y.o.   MRN: [id number]  HPI The patient is here for follow up.  CAD, Hypertension: She is taking her medication daily. She is compliant with a low sodium diet.  She denies chest pain, palpitations, edema, shortness of breath and regular headaches. She is exercising minimally - bed exercises.  She does not monitor her blood pressure at home.    Hyperlipidemia: She is taking her medication daily. She is compliant with a low fat/cholesterol diet. She is exercising minimally. She denies myalgias.   Diabetes: She is taking her medication daily as prescribed. She is compliant with a diabetic diet. She is exercising minimally. She monitors her sugars and they have been running: morning 70-90's, occasionally she gets a 40-50 early in the morning.  She gets 100,141 before dinner. She checks her feet daily and denies foot lesions. She is up-to-date with an ophthalmology examination.   Burning and numbness in feet that sometimes goes up leg and in fingers.  This started in the past 6 months.  She feels this intermittently.     Chronic lower back pain:  She has had surgery years ago.  She takes tramadol on a rare occasion.   GERD:  She is taking her medication daily as prescribed.  She denies any GERD symptoms and feels her GERD is well controlled.    Medications and allergies reviewed with patient and updated if appropriate.  Patient Active Problem List   Diagnosis Date Noted  . Chronic pain of right knee 12/28/2015  . CKD (chronic kidney disease) 10/27/2015  . Lower back pain 10/24/2015  . Pain in both feet 10/24/2015  . Abnormality of gait 04/01/2013  . Posterior tibial tendon dysfunction 04/01/2013  . Diabetes ([place]) 04/01/2013  . Ankle pain, left 10/20/2010  . UNSPECIFIED VITAMIN D DEFICIENCY 11/08/2008  . EDEMA- LOCALIZED 11/08/2008  . Sickle-cell trait ([place]) 01/01/2008  . Esophageal reflux 09/02/2007  .  GOITER, MULTINODULAR 11/26/2006  . Coronary atherosclerosis 11/26/2006  . HIATAL HERNIA 11/26/2006  . FIBROCYSTIC BREAST DISEASE 07/31/2006  . HYPERCHOLESTEROLEMIA 05/27/2006  . Essential hypertension 05/27/2006    Current Outpatient Prescriptions on File Prior to Visit  Medication Sig Dispense Refill  . ACCU-CHEK FASTCLIX LANCETS MISC Use to test two times daily 204 each 11  . ACCU-CHEK SMARTVIEW test strip Use 1 two times daily as  instructed 200 each 11  . Alcohol Swabs (B-D SINGLE USE SWABS REGULAR) PADS     . amLODipine (NORVASC) 10 MG tablet Take 1 tablet by mouth  every day 90 tablet 3  . Ascorbic Acid (VITAMIN C) 500 MG tablet Take 500 mg by mouth 2 (two) times daily.      Marland Kitchen aspirin 81 MG tablet Take 81 mg by mouth daily.      Marland Kitchen atorvastatin (LIPITOR) 10 MG tablet Take 10 mg by mouth daily.    . Blood Glucose Monitoring Suppl (ACCU-CHEK NANO SMARTVIEW) W/DEVICE KIT 1 Device by Does not apply route once. 1 kit 0  . calcium carbonate (OS-CAL) 600 MG TABS Take 600 mg by mouth daily.     . Cholecalciferol (VITAMIN D3) 5000 UNITS CAPS Take 5,000 Units by mouth daily.      Marland Kitchen Cod Liver Oil 1000 MG CAPS Take 1,000 mg by mouth daily. During the winter    . diclofenac sodium (VOLTAREN) 1 % GEL APPLY TWO GRAMS TOPICALLY FOUR TIMES DAILY, PLACE ON LOWER BACK  AND KNEE 1 Tube 0  . hydrochlorothiazide (MICROZIDE) 12.5 MG capsule Take 1 capsule (12.5 mg total) by mouth daily as needed. 90 capsule 1  . insulin NPH-regular (NOVOLIN 70/30 RELION) (70-30) 100 UNIT/ML injection 15 units with breakfast, and 5 units with the evening meal    . isosorbide mononitrate (IMDUR) 30 MG 24 hr tablet Take 1 tablet by mouth  every day 90 tablet 3  . latanoprost (XALATAN) 0.005 % ophthalmic solution 1 drop at bedtime.    . lidocaine (LIDODERM) 5 % Place 1 patch onto the skin daily. Remove & Discard patch within 12 hours or as directed by MD 30 patch 1  . LORazepam (ATIVAN) 0.5 MG tablet TAKE ONE TABLET BY MOUTH  EVERY 6 HOURS AS NEEDED FOR ANXIETY 30 tablet 0  . meclizine (ANTIVERT) 25 MG tablet Take 1/2 to 1 tab three times a day as needed 15 tablet 0  . Multiple Vitamin (MULTIVITAMIN) tablet Take 1 tablet by mouth daily.      . nitroGLYCERIN (NITROSTAT) 0.4 MG SL tablet DISSOLVE ONE TABLET UNDER TONGUE AS DIRECTED FOR CHEST PAIN 25 tablet 5  . omeprazole (PRILOSEC) 20 MG capsule Take 1 capsule by mouth    every morning 90 capsule 3  . quinapril (ACCUPRIL) 40 MG tablet Take 1 tablet by mouth    every day 90 tablet 3  . traMADol (ULTRAM) 50 MG tablet Take 1 tablet (50 mg total) by mouth every 6 (six) hours as needed. 30 tablet 0  . vitamin E (VITAMIN E) 400 UNIT capsule Take 400 Units by mouth daily.       No current facility-administered medications on file prior to visit.     Past Medical History:  Diagnosis Date  . Anemia, unspecified   . Ankle pain, left   . CAD (coronary artery disease)   . Esophageal reflux   . Fibrocystic breast disease   . Hiatal hernia   . Hypercholesteremia   . Hypertension   . IDDM (insulin dependent diabetes mellitus) ([place])    Type II  . Multinodular goiter   . Sickle-cell trait [facility])     Past Surgical History:  Procedure Laterality Date  . "FEMALE"  1976  . BACK SURGERY  1990  . BIOPSY THYROID     Dr.Ellison-Neg   . CORONARY ANGIOPLASTY    . FOOT SURGERY  2004  . RETINAL LASER PROCEDURE      Social History   Social History  . Marital status: Widowed    Spouse name: N/A  . Number of children: N/A  . Years of education: N/A   Social History Main Topics  . Smoking status: Never Smoker  . Smokeless tobacco: Never Used  . Alcohol use No  . Drug use: No  . Sexual activity: Not Asked   Other Topics Concern  . None   Social History Narrative   Widowed   Gets regular exercise    Family History  Problem Relation Age of Onset  . Hypertension    . Lung cancer    . Heart disease    . Gout Sister   . Gout Brother   . Heart attack Father     . Heart attack Mother   . Gout Brother     Review of Systems  Constitutional: Negative for fever.  Respiratory: Negative for cough, shortness of breath and wheezing.   Cardiovascular: Positive for leg swelling (occ - takes hctz only as needed). Negative for chest pain and palpitations.  Neurological: Positive for headaches (occ). Negative for light-headedness.       Objective:   Vitals:   04/23/16 1016  BP: 130/60  Pulse: 84  Resp: 16  Temp: 98.1 F (36.7 C)   Wt Readings from Last 3 Encounters:  04/23/16 201 lb (91.2 kg)  10/24/15 198 lb (89.8 kg)  07/14/15 205 lb (93 kg)   Body mass index is 36.76 kg/m.   Physical Exam    Constitutional: Appears well-developed and well-nourished. No distress.  HENT:  Head: Normocephalic and atraumatic.  Neck: Neck supple. No tracheal deviation present. No thyromegaly present.  No cervical lymphadenopathy Cardiovascular: Normal rate, regular rhythm and normal heart sounds.   2/6 sys murmur heard. No carotid bruit .  No edema Pulmonary/Chest: Effort normal and breath sounds normal. No respiratory distress. No has no wheezes. No rales.  Skin: Skin is warm and dry. Not diaphoretic.  Psychiatric: Normal mood and affect. Behavior is normal.      Assessment & Plan:    See Problem List for Assessment and Plan of chronic medical problems.

## 2016-04-23 ENCOUNTER — Ambulatory Visit (INDEPENDENT_AMBULATORY_CARE_PROVIDER_SITE_OTHER): Payer: PPO | Admitting: Internal Medicine

## 2016-04-23 ENCOUNTER — Other Ambulatory Visit (INDEPENDENT_AMBULATORY_CARE_PROVIDER_SITE_OTHER): Payer: PPO

## 2016-04-23 ENCOUNTER — Encounter: Payer: Self-pay | Admitting: Internal Medicine

## 2016-04-23 VITALS — BP 130/60 | HR 84 | Temp 98.1°F | Resp 16 | Wt 201.0 lb

## 2016-04-23 DIAGNOSIS — E7800 Pure hypercholesterolemia, unspecified: Secondary | ICD-10-CM | POA: Diagnosis not present

## 2016-04-23 DIAGNOSIS — I2510 Atherosclerotic heart disease of native coronary artery without angina pectoris: Secondary | ICD-10-CM

## 2016-04-23 DIAGNOSIS — R200 Anesthesia of skin: Secondary | ICD-10-CM

## 2016-04-23 DIAGNOSIS — E1142 Type 2 diabetes mellitus with diabetic polyneuropathy: Secondary | ICD-10-CM

## 2016-04-23 DIAGNOSIS — N189 Chronic kidney disease, unspecified: Secondary | ICD-10-CM

## 2016-04-23 DIAGNOSIS — I10 Essential (primary) hypertension: Secondary | ICD-10-CM | POA: Diagnosis not present

## 2016-04-23 DIAGNOSIS — K219 Gastro-esophageal reflux disease without esophagitis: Secondary | ICD-10-CM

## 2016-04-23 DIAGNOSIS — Z794 Long term (current) use of insulin: Secondary | ICD-10-CM

## 2016-04-23 LAB — VITAMIN B12: Vitamin B-12: 811 pg/mL (ref 211–911)

## 2016-04-23 LAB — LIPID PANEL
Cholesterol: 202 mg/dL — ABNORMAL HIGH (ref 0–200)
HDL: 89.6 mg/dL (ref >39.00)
LDL Cholesterol: 98 mg/dL (ref 0–99)
NonHDL: 112.47
Total CHOL/HDL Ratio: 2
Triglycerides: 72 mg/dL (ref 0.0–149.0)
VLDL: 14.4 mg/dL (ref 0.0–40.0)

## 2016-04-23 LAB — COMPREHENSIVE METABOLIC PANEL
ALT: 14 U/L (ref 0–35)
AST: 16 U/L (ref 0–37)
Albumin: 3.9 g/dL (ref 3.5–5.2)
Alkaline Phosphatase: 49 U/L (ref 39–117)
BUN: 27 mg/dL — ABNORMAL HIGH (ref 6–23)
CO2: 29 mEq/L (ref 19–32)
Calcium: 9.5 mg/dL (ref 8.4–10.5)
Chloride: 105 mEq/L (ref 96–112)
Creatinine, Ser: 1.04 mg/dL (ref 0.40–1.20)
GFR: 65.27 mL/min (ref >60.00)
Glucose, Bld: 155 mg/dL — ABNORMAL HIGH (ref 70–99)
Potassium: 4.6 mEq/L (ref 3.5–5.1)
Sodium: 141 mEq/L (ref 135–145)
Total Bilirubin: 0.5 mg/dL (ref 0.2–1.2)
Total Protein: 6.8 g/dL (ref 6.0–8.3)

## 2016-04-23 LAB — HEMOGLOBIN A1C: Hgb A1c MFr Bld: 7.9 % — ABNORMAL HIGH (ref 4.6–6.5)

## 2016-04-23 MED ORDER — GABAPENTIN 100 MG PO CAPS: 100.0000 mg | ORAL_CAPSULE | Freq: Every day | ORAL | 3 refills | Status: DC

## 2016-04-23 MED ORDER — METFORMIN HCL 500 MG PO TABS: 500.0000 mg | ORAL_TABLET | Freq: Two times a day (BID) | ORAL | 1 refills | Status: DC

## 2016-04-23 MED ORDER — ATORVASTATIN CALCIUM 10 MG PO TABS: 10.0000 mg | ORAL_TABLET | Freq: Every day | ORAL | 3 refills | Status: DC

## 2016-04-23 NOTE — Assessment & Plan Note (Signed)
Check B12 Has reported numbness/tingling and burning sensation in the past Probably neuropathy Start gabapentin 100 mg at night  - discussed possible side effects Will inc dose if tolerated

## 2016-04-23 NOTE — Assessment & Plan Note (Signed)
BP well controlled Current regimen effective and well tolerated Continue current medications at current doses cmp  

## 2016-04-23 NOTE — Assessment & Plan Note (Signed)
No chest pain, sob or palps Continue current medications

## 2016-04-23 NOTE — Progress Notes (Signed)
Pre visit review using our clinic review tool, if applicable. No additional management support is needed unless otherwise documented below in the visit note. 

## 2016-04-23 NOTE — Assessment & Plan Note (Signed)
cmp

## 2016-04-23 NOTE — Assessment & Plan Note (Signed)
GERD controlled Continue daily medication  

## 2016-04-23 NOTE — Assessment & Plan Note (Signed)
Having hypoglycemia on NPH Will d/c NPH Start metformin 500 mg twice daily  Check a1c, cmp

## 2016-04-23 NOTE — Assessment & Plan Note (Signed)
Check lipid panel  Continue daily statin Regular exercise and healthy diet encouraged  

## 2016-04-24 ENCOUNTER — Telehealth: Payer: Self-pay | Admitting: Emergency Medicine

## 2016-04-24 NOTE — Telephone Encounter (Signed)
Spoke with pt, she states she checked her sugar this morning and it was 118, took her sugar after eating a half of a peanut butter sandwich and it was 199 20-30 mins after. Informed pt it was most likely from eating the peanut butter. She is to continue to monitor sugars throughout the week and will call back Friday if not any better.

## 2016-04-24 NOTE — Telephone Encounter (Signed)
Pt asked that you return her call. She has questions about a medication change. Please advise thanks.

## 2016-04-26 ENCOUNTER — Other Ambulatory Visit: Payer: Self-pay | Admitting: Internal Medicine

## 2016-05-31 ENCOUNTER — Other Ambulatory Visit: Payer: Self-pay | Admitting: *Deleted

## 2016-05-31 MED ORDER — NOVOLIN 70/30 RELION (70-30) 100 UNIT/ML ~~LOC~~ SUSP: SUBCUTANEOUS | 5 refills | Status: DC

## 2016-05-31 MED ORDER — ACCU-CHEK FASTCLIX LANCETS MISC: 3 refills | Status: DC

## 2016-05-31 MED ORDER — GLUCOSE BLOOD VI STRP: ORAL_STRIP | 3 refills | Status: DC

## 2016-05-31 NOTE — Telephone Encounter (Signed)
Rec'd call pt requesting refill on her novolin insulin, and testing strips & lancets. Verified pharmacy inform pt sending refills to [facility] as we speak...Raechel Chute/lmb

## 2016-06-01 ENCOUNTER — Telehealth: Payer: Self-pay | Admitting: Internal Medicine

## 2016-06-01 MED ORDER — ISOPROPYL ALCOHOL 70 % EX MISC: CUTANEOUS | 1 refills | Status: DC

## 2016-06-01 MED ORDER — BLOOD GLUCOSE METER KIT: PACK | 0 refills | Status: DC

## 2016-06-01 NOTE — Telephone Encounter (Signed)
Meter and supplies have been sent to pharmacy

## 2016-06-01 NOTE — Telephone Encounter (Signed)
Pt called in and said that she currently has the acucheck meter but her ins will no longer cover that one,  She needs the one touch and all the supplies.  Strips and  alcohol pads.  [facility] on [address]

## 2016-06-04 MED ORDER — GLUCOSE BLOOD VI STRP: 1.0000 | ORAL_STRIP | Freq: Two times a day (BID) | 5 refills | Status: DC

## 2016-06-04 MED ORDER — ONETOUCH ULTRASOFT LANCETS MISC: 1.0000 | Freq: Two times a day (BID) | 5 refills | Status: DC

## 2016-06-04 MED ORDER — ONETOUCH ULTRA 2 W/DEVICE KIT: PACK | 0 refills | Status: DC

## 2016-06-04 NOTE — Telephone Encounter (Signed)
Resent onetouch monitor w/supplies to [facility]...Raechel Chute/lmb

## 2016-06-04 NOTE — Addendum Note (Signed)
Addended by: Deatra JamesBRAND, LUCY M on: 06/04/2016 01:21 PM   Modules accepted: Orders

## 2016-06-04 NOTE — Telephone Encounter (Signed)
Patient states the pharmacy does not have her meter and supplies can you please send it in again. Thank you.

## 2016-06-21 ENCOUNTER — Telehealth: Payer: Self-pay | Admitting: Emergency Medicine

## 2016-06-21 NOTE — Telephone Encounter (Signed)
Error

## 2016-07-17 NOTE — Progress Notes (Signed)
Subjective:   Rebecca [name] is a 83 y.o. female who presents for an Initial Medicare Annual Wellness Visit.  Review of Systems    No ROS.  Medicare Wellness Visit.  Cardiac Risk Factors include: advanced age (>67mn, >>48women);diabetes mellitus;dyslipidemia;hypertension;obesity (BMI >30kg/m2) Sleep patterns: feels rested on waking, gets up 1-2 times nightly to void and sleeps 6-8 hours nightly.   Home Safety/Smoke Alarms: Feels safe in home. Smoke alarms in place.   Living environment; residence and Firearm Safety: 1-story house/ trailer, equipment: Walkers, Type: RConservation officer, nature no firearms. Seat Belt Safety/Bike Helmet: Wears seat belt.   Counseling:   Eye Exam- appointment yearly Dental- appointment every 6 months   Female:   Pap- N/A      Mammo- none      Dexa scan- none       CCS- N/A       Objective:    Today's Vitals   07/23/16 0931  BP: (!) 130/58  Pulse: 66  Resp: 16  Temp: 98.3 F (36.8 C)  TempSrc: Oral  SpO2: 98%  Weight: 202 lb (91.6 kg)   Body mass index is 36.95 kg/m.   Current Medications (verified) Outpatient Encounter Prescriptions as of 07/23/2016  Medication Sig  . Alcohol Swabs (B-D SINGLE USE SWABS REGULAR) PADS   . amLODipine (NORVASC) 10 MG tablet Take 1 tablet by mouth  every day  . Ascorbic Acid (VITAMIN C) 500 MG tablet Take 500 mg by mouth 2 (two) times daily.    .Marland Kitchenaspirin 81 MG tablet Take 81 mg by mouth daily.    .Marland Kitchenatorvastatin (LIPITOR) 10 MG tablet Take 1 tablet (10 mg total) by mouth daily.  . Blood Glucose Monitoring Suppl (ONE TOUCH ULTRA 2) w/Device KIT Use to check blood sugars twice a day Dx e11.9  . calcium carbonate (OS-CAL) 600 MG TABS Take 600 mg by mouth daily.   . Cholecalciferol (VITAMIN D3) 5000 UNITS CAPS Take 5,000 Units by mouth daily.    .Marland KitchenCod Liver Oil 1000 MG CAPS Take 1,000 mg by mouth daily. During the winter  . diclofenac sodium (VOLTAREN) 1 % GEL APPLY TWO GRAMS TOPICALLY FOUR TIMES DAILY, PLACE  ON LOWER BACK AND KNEE  . gabapentin (NEURONTIN) 100 MG capsule Take 1 capsule (100 mg total) by mouth at bedtime.  .Marland Kitchenglucose blood (ACCU-CHEK SMARTVIEW) test strip Use to check blood sugars twice a day Dx E11.9  . glucose blood (ONE TOUCH ULTRA TEST) test strip 1 each by Other route 2 (two) times daily. Use to check blood sugars twice a day Dx E11.9  . hydrochlorothiazide (MICROZIDE) 12.5 MG capsule Take 1 capsule (12.5 mg total) by mouth daily as needed.  . Isopropyl Alcohol (PHARMACIST CHOICE ALCOHOL) 70 % MISC Use with Glucose meter to check sugars daily DX E11.42  . isosorbide mononitrate (IMDUR) 30 MG 24 hr tablet Take 1 tablet by mouth  every day  . Lancets (ONETOUCH ULTRASOFT) lancets 1 each by Other route 2 (two) times daily. Use to help check blood sugars twice a day Dx E11.9  . latanoprost (XALATAN) 0.005 % ophthalmic solution 1 drop at bedtime.  . lidocaine (LIDODERM) 5 % Place 1 patch onto the skin daily. Remove & Discard patch within 12 hours or as directed by MD  . LORazepam (ATIVAN) 0.5 MG tablet Take 0.5 tablets (0.25 mg total) by mouth at bedtime as needed for anxiety. for anxiety  . meclizine (ANTIVERT) 25 MG tablet Take 1/2 to 1 tab three  times a day as needed  . meloxicam (MOBIC) 15 MG tablet TAKE ONE TABLET BY MOUTH ONCE DAILY  . Multiple Vitamin (MULTIVITAMIN) tablet Take 1 tablet by mouth daily.    . nitroGLYCERIN (NITROSTAT) 0.4 MG SL tablet DISSOLVE ONE TABLET UNDER TONGUE AS DIRECTED FOR CHEST PAIN  . NOVOLIN 70/30 RELION (70-30) 100 UNIT/ML injection Take 15 units with breakfast and 5 units with evening meals  . omeprazole (PRILOSEC) 20 MG capsule Take 1 capsule by mouth    every morning  . quinapril (ACCUPRIL) 40 MG tablet Take 1 tablet by mouth    every day  . traMADol (ULTRAM) 50 MG tablet Take 1 tablet (50 mg total) by mouth every 6 (six) hours as needed.  . vitamin E (VITAMIN E) 400 UNIT capsule Take 400 Units by mouth daily.    . [DISCONTINUED]  hydrochlorothiazide (MICROZIDE) 12.5 MG capsule Take 1 capsule (12.5 mg total) by mouth daily as needed.  . [DISCONTINUED] LORazepam (ATIVAN) 0.5 MG tablet TAKE ONE TABLET BY MOUTH EVERY 6 HOURS AS NEEDED FOR ANXIETY  . [DISCONTINUED] metFORMIN (GLUCOPHAGE) 500 MG tablet Take 1 tablet (500 mg total) by mouth 2 (two) times daily with a meal. (Patient not taking: Reported on 07/23/2016)   No facility-administered encounter medications on file as of 07/23/2016.     Allergies (verified) Codeine   History: Past Medical History:  Diagnosis Date  . Anemia, unspecified   . Ankle pain, left   . CAD (coronary artery disease)   . Esophageal reflux   . Fibrocystic breast disease   . Hiatal hernia   . Hypercholesteremia   . Hypertension   . IDDM (insulin dependent diabetes mellitus) ([place])    Type II  . Multinodular goiter   . Sickle-cell trait [facility])    Past Surgical History:  Procedure Laterality Date  . "FEMALE"  1976  . BACK SURGERY  1990  . BIOPSY THYROID     Dr.Ellison-Neg   . CORONARY ANGIOPLASTY    . FOOT SURGERY  2004  . RETINAL LASER PROCEDURE     Family History  Problem Relation Age of Onset  . Hypertension Unknown   . Lung cancer Unknown   . Heart disease Unknown   . Gout Sister   . Gout Brother   . Heart attack Father   . Heart attack Mother   . Gout Brother    Social History   Occupational History  . Not on file.   Social History Main Topics  . Smoking status: Never Smoker  . Smokeless tobacco: Never Used  . Alcohol use No  . Drug use: No  . Sexual activity: Not on file    Tobacco Counseling Counseling given: Not Answered   Activities of Daily Living In your present state of health, do you have any difficulty performing the following activities: 07/23/2016  Hearing? N  Vision? N  Difficulty concentrating or making decisions? N  Walking or climbing stairs? N  Dressing or bathing? N  Doing errands, shopping? N  Preparing Food and eating ? N  Using  the Toilet? N  In the past six months, have you accidently leaked urine? N  Do you have problems with loss of bowel control? N  Managing your Medications? N  Managing your Finances? N  Housekeeping or managing your Housekeeping? N  Some recent data might be hidden    Immunizations and Health Maintenance Immunization History  Administered Date(s) Administered  . Influenza Split 01/22/2011, 02/20/2012  . Influenza Whole 03/13/1999,  01/01/2008, 02/07/2009, 12/13/2009  . Influenza, High Dose Seasonal PF 02/07/2016  . Influenza,inj,Quad PF,36+ Mos 01/16/2013  . Pneumococcal Conjugate-13 07/14/2015  . Pneumococcal Polysaccharide-23 05/08/2006   Health Maintenance Due  Topic Date Due  . OPHTHALMOLOGY EXAM  06/20/1944  . TETANUS/TDAP  06/20/1953  . DEXA SCAN  06/21/1999    Patient Care Team: Binnie Rail, MD as PCP - General (Internal Medicine)  Indicate any recent Medical Services you may have received from other than [facility] providers in the past year (date may be approximate).     Assessment:   This is a routine wellness examination for Rebecca [name]. Physical assessment deferred to PCP.   Hearing/Vision screen Hearing Screening Comments: Able to hear conversational tones w/o difficulty. No issues reported.   Vision Screening Comments: Wears glasses  Dietary issues and exercise activities discussed: Current Exercise Habits: Home exercise routine, Type of exercise: stretching (patient received pamphlets for chair exercises.), Time (Minutes): 30, Frequency (Times/Week): 4, Weekly Exercise (Minutes/Week): 120, Intensity: Mild, Exercise limited by: orthopedic condition(s)  Diet (meal preparation, eat out, water intake, caffeinated beverages, dairy products, fruits and vegetables): in general, a "healthy" diet  , well balanced, diabetic, low fat/ cholesterol, low salt eats a variety of fruits and vegetables daily, limits salt, fat/cholesterol, sugar, caffeine, drinks 6-8 glasses of  water daily. States she eats as healthy as her income will allow, adding that eating healthy is expensive, Diet education about eating healthy on a budget was attached to patient's AVS.   Goals    . Maintain current health status          Continue to eat healthy, be as active as possible, and be independent.      Depression Screen PHQ 2/9 Scores 07/23/2016 12/28/2015 10/24/2015 04/01/2013  PHQ - 2 Score 0 0 0 0  PHQ- 9 Score - 2 - -    Fall Risk Fall Risk  07/23/2016 12/28/2015 10/24/2015 04/01/2013  Falls in the past year? No No No No    Cognitive Function: MMSE - Mini Mental State Exam 07/23/2016  Orientation to time 5  Orientation to Place 5  Registration 3  Attention/ Calculation 4  Recall 2  Language- name 2 objects 2  Language- repeat 1  Language- follow 3 step command 3  Language- read & follow direction 1  Write a sentence 1  Copy design 1  Total score 28        Screening Tests Health Maintenance  Topic Date Due  . OPHTHALMOLOGY EXAM  06/20/1944  . TETANUS/TDAP  06/20/1953  . DEXA SCAN  06/21/1999  . INFLUENZA VACCINE  10/10/2016  . HEMOGLOBIN A1C  10/21/2016  . FOOT EXAM  07/23/2017  . PNA vac Low Risk Adult  Completed      Plan:    Continue to eat heart healthy diet (full of fruits, vegetables, whole grains, lean protein, water--limit salt, fat, and sugar intake) and increase physical activity as tolerated.  Continue doing brain stimulating activities (puzzles, reading, adult coloring books, staying active) to keep memory sharp.     I have personally reviewed and noted the following in the patient's chart:   . Medical and social history . Use of alcohol, tobacco or illicit drugs  . Current medications and supplements . Functional ability and status . Nutritional status . Physical activity . Advanced directives . List of other physicians . Vitals . Screenings to include cognitive, depression, and falls . Referrals and appointments  In  addition, I have reviewed and discussed with  patient certain preventive protocols, quality metrics, and best practice recommendations. A written personalized care plan for preventive services as well as general preventive health recommendations were provided to patient.     Michiel Cowboy, RN   07/23/2016     Medical screening examination/treatment/procedure(s) were performed by non-physician practitioner and as supervising physician I was immediately available for consultation/collaboration. I agree with above. Binnie Rail, MD

## 2016-07-17 NOTE — Progress Notes (Signed)
Pre visit review using our clinic review tool, if applicable. No additional management support is needed unless otherwise documented below in the visit note. 

## 2016-07-21 NOTE — Progress Notes (Signed)
Subjective:    Patient ID: Rebecca [name], female    DOB: Aug 12, 1932, 83 y.o.   MRN: [id number]  HPI The patient is here for follow up.  CAD, Hypertension: She is taking her medication daily. She is compliant with a low sodium diet.  She denies chest pain, palpitations, shortness of breath and regular headaches. Her leg swelling is chronic and stable.  She is not exercising regularly due to back pain.     GERD:  She is taking her medication daily as prescribed.  She denies any GERD symptoms and feels her GERD is well controlled.   Diabetes: She is taking her medication daily as prescribed. She is compliant with a diabetic diet. She is not exercising regularly. She monitors her sugars and they have been running low in the afternoon if she does not eat lunch - she sometimes gets busy and forgets to eat.  As long as she eats three meals a day her sugars are ok.  We did try her on metformin, but she did not think it was working well and went back to the insulin.   Hyperlipidemia: She is taking her medication daily. She is compliant with a low fat/cholesterol diet. She is not exercising regularly. She denies myalgias other than her chronic pain.  Chronic lower back pain; She takes tramadol on occasion.  She has difficulty standing long periods of time and doing her household chores.  She wonders if she could have a back brace to wear when she did certain things around the house to help with the pain.  She has not seen anyone for her back in a while.   Burning and tingling in feet:  We started gabapentin three months ago.  She denies side effects.  She thinks it has helped and is happy with her current dose.    She does see podiatry and is working on getting a new pair of diabetic shoes.  She has chronic ankle pain.    Medications and allergies reviewed with patient and updated if appropriate.  Patient Active Problem List   Diagnosis Date Noted  . Numbness 04/23/2016  . Chronic pain of  right knee 12/28/2015  . CKD (chronic kidney disease) 10/27/2015  . Lower back pain 10/24/2015  . Pain in both feet 10/24/2015  . Abnormality of gait 04/01/2013  . Posterior tibial tendon dysfunction 04/01/2013  . Diabetes ([place]) 04/01/2013  . Ankle pain, left 10/20/2010  . UNSPECIFIED VITAMIN D DEFICIENCY 11/08/2008  . EDEMA- LOCALIZED 11/08/2008  . Sickle-cell trait ([place]) 01/01/2008  . Esophageal reflux 09/02/2007  . GOITER, MULTINODULAR 11/26/2006  . Coronary atherosclerosis 11/26/2006  . HIATAL HERNIA 11/26/2006  . FIBROCYSTIC BREAST DISEASE 07/31/2006  . HYPERCHOLESTEROLEMIA 05/27/2006  . Essential hypertension 05/27/2006    Current Outpatient Prescriptions on File Prior to Visit  Medication Sig Dispense Refill  . Alcohol Swabs (B-D SINGLE USE SWABS REGULAR) PADS     . amLODipine (NORVASC) 10 MG tablet Take 1 tablet by mouth  every day 90 tablet 3  . Ascorbic Acid (VITAMIN C) 500 MG tablet Take 500 mg by mouth 2 (two) times daily.      Marland Kitchen aspirin 81 MG tablet Take 81 mg by mouth daily.      Marland Kitchen atorvastatin (LIPITOR) 10 MG tablet Take 1 tablet (10 mg total) by mouth daily. 90 tablet 3  . Blood Glucose Monitoring Suppl (ONE TOUCH ULTRA 2) w/Device KIT Use to check blood sugars twice a day Dx e11.9 1 each  0  . calcium carbonate (OS-CAL) 600 MG TABS Take 600 mg by mouth daily.     . Cholecalciferol (VITAMIN D3) 5000 UNITS CAPS Take 5,000 Units by mouth daily.      Marland Kitchen Cod Liver Oil 1000 MG CAPS Take 1,000 mg by mouth daily. During the winter    . diclofenac sodium (VOLTAREN) 1 % GEL APPLY TWO GRAMS TOPICALLY FOUR TIMES DAILY, PLACE ON LOWER BACK AND KNEE 1 Tube 0  . gabapentin (NEURONTIN) 100 MG capsule Take 1 capsule (100 mg total) by mouth at bedtime. 30 capsule 3  . glucose blood (ACCU-CHEK SMARTVIEW) test strip Use to check blood sugars twice a day Dx E11.9 200 each 3  . glucose blood (ONE TOUCH ULTRA TEST) test strip 1 each by Other route 2 (two) times daily. Use to check blood  sugars twice a day Dx E11.9 100 each 5  . hydrochlorothiazide (MICROZIDE) 12.5 MG capsule Take 1 capsule (12.5 mg total) by mouth daily as needed. 90 capsule 1  . Isopropyl Alcohol (PHARMACIST CHOICE ALCOHOL) 70 % MISC Use with Glucose meter to check sugars daily DX E11.42 100 each 1  . isosorbide mononitrate (IMDUR) 30 MG 24 hr tablet Take 1 tablet by mouth  every day 90 tablet 3  . Lancets (ONETOUCH ULTRASOFT) lancets 1 each by Other route 2 (two) times daily. Use to help check blood sugars twice a day Dx E11.9 100 each 5  . latanoprost (XALATAN) 0.005 % ophthalmic solution 1 drop at bedtime.    . lidocaine (LIDODERM) 5 % Place 1 patch onto the skin daily. Remove & Discard patch within 12 hours or as directed by MD 30 patch 1  . LORazepam (ATIVAN) 0.5 MG tablet TAKE ONE TABLET BY MOUTH EVERY 6 HOURS AS NEEDED FOR ANXIETY 30 tablet 0  . meclizine (ANTIVERT) 25 MG tablet Take 1/2 to 1 tab three times a day as needed 15 tablet 0  . meloxicam (MOBIC) 15 MG tablet TAKE ONE TABLET BY MOUTH ONCE DAILY 90 tablet 1  . Multiple Vitamin (MULTIVITAMIN) tablet Take 1 tablet by mouth daily.      . nitroGLYCERIN (NITROSTAT) 0.4 MG SL tablet DISSOLVE ONE TABLET UNDER TONGUE AS DIRECTED FOR CHEST PAIN 25 tablet 5  . NOVOLIN 70/30 RELION (70-30) 100 UNIT/ML injection Take 15 units with breakfast and 5 units with evening meals 10 mL 5  . omeprazole (PRILOSEC) 20 MG capsule Take 1 capsule by mouth    every morning 90 capsule 3  . quinapril (ACCUPRIL) 40 MG tablet Take 1 tablet by mouth    every day 90 tablet 3  . traMADol (ULTRAM) 50 MG tablet Take 1 tablet (50 mg total) by mouth every 6 (six) hours as needed. 30 tablet 0  . vitamin E (VITAMIN E) 400 UNIT capsule Take 400 Units by mouth daily.      . metFORMIN (GLUCOPHAGE) 500 MG tablet Take 1 tablet (500 mg total) by mouth 2 (two) times daily with a meal. (Patient not taking: Reported on 07/23/2016) 180 tablet 1   No current facility-administered medications on  file prior to visit.     Past Medical History:  Diagnosis Date  . Anemia, unspecified   . Ankle pain, left   . CAD (coronary artery disease)   . Esophageal reflux   . Fibrocystic breast disease   . Hiatal hernia   . Hypercholesteremia   . Hypertension   . IDDM (insulin dependent diabetes mellitus) ([place])    Type II  .  Multinodular goiter   . Sickle-cell trait [facility])     Past Surgical History:  Procedure Laterality Date  . "FEMALE"  1976  . BACK SURGERY  1990  . BIOPSY THYROID     Dr.Ellison-Neg   . CORONARY ANGIOPLASTY    . FOOT SURGERY  2004  . RETINAL LASER PROCEDURE      Social History   Social History  . Marital status: Widowed    Spouse name: N/A  . Number of children: N/A  . Years of education: N/A   Social History Main Topics  . Smoking status: Never Smoker  . Smokeless tobacco: Never Used  . Alcohol use No  . Drug use: No  . Sexual activity: Not on file   Other Topics Concern  . Not on file   Social History Narrative   Widowed   Gets regular exercise    Family History  Problem Relation Age of Onset  . Hypertension Unknown   . Lung cancer Unknown   . Heart disease Unknown   . Gout Sister   . Gout Brother   . Heart attack Father   . Heart attack Mother   . Gout Brother     Review of Systems  Constitutional: Negative for fever.  Respiratory: Positive for cough (allergy related). Negative for shortness of breath and wheezing.   Cardiovascular: Positive for leg swelling. Negative for chest pain and palpitations.  Neurological: Negative for light-headedness and headaches.       Objective:   Vitals:   07/23/16 0931  BP: (!) 130/58  Pulse: 66  Resp: 16  Temp: 98.3 F (36.8 C)   Wt Readings from Last 3 Encounters:  07/23/16 202 lb (91.6 kg)  04/23/16 201 lb (91.2 kg)  10/24/15 198 lb (89.8 kg)   Body mass index is 36.95 kg/m.   Physical Exam    Constitutional: Appears well-developed and well-nourished. No distress.  HENT:    Head: Normocephalic and atraumatic.  Neck: Neck supple. No tracheal deviation present. No thyromegaly present.  No cervical lymphadenopathy Cardiovascular: Normal rate, regular rhythm and normal heart sounds.   No murmur heard. No carotid bruit .  Trace ankle edema b/l LE Pulmonary/Chest: Effort normal and breath sounds normal. No respiratory distress. No has no wheezes. No rales.  Skin: Skin is warm and dry. Not diaphoretic.  Psychiatric: Normal mood and affect. Behavior is normal.   Diabetic Foot Exam - Simple   Simple Foot Form Diabetic Foot exam was performed with the following findings:  Yes 07/23/2016 10:11 AM  Visual Inspection See comments:  Yes Sensation Testing See comments:  Yes Pulse Check Posterior Tibialis and Dorsalis pulse intact bilaterally:  Yes Comments No ulceration or skin breakdown bilaterally.  Left ankle with deformity medially, b/l second toes with tips of toes downward; decreased sensation left plantar surface > right       Assessment & Plan:    See Problem List for Assessment and Plan of chronic medical problems.

## 2016-07-23 ENCOUNTER — Telehealth: Payer: Self-pay | Admitting: *Deleted

## 2016-07-23 ENCOUNTER — Encounter: Payer: Self-pay | Admitting: Internal Medicine

## 2016-07-23 ENCOUNTER — Ambulatory Visit (INDEPENDENT_AMBULATORY_CARE_PROVIDER_SITE_OTHER): Payer: PPO | Admitting: Internal Medicine

## 2016-07-23 VITALS — BP 130/58 | HR 66 | Temp 98.3°F | Resp 16 | Wt 202.0 lb

## 2016-07-23 DIAGNOSIS — M545 Low back pain: Secondary | ICD-10-CM

## 2016-07-23 DIAGNOSIS — E1142 Type 2 diabetes mellitus with diabetic polyneuropathy: Secondary | ICD-10-CM

## 2016-07-23 DIAGNOSIS — G8929 Other chronic pain: Secondary | ICD-10-CM | POA: Diagnosis not present

## 2016-07-23 DIAGNOSIS — I2510 Atherosclerotic heart disease of native coronary artery without angina pectoris: Secondary | ICD-10-CM

## 2016-07-23 DIAGNOSIS — Z794 Long term (current) use of insulin: Secondary | ICD-10-CM | POA: Diagnosis not present

## 2016-07-23 DIAGNOSIS — Z0000 Encounter for general adult medical examination without abnormal findings: Secondary | ICD-10-CM

## 2016-07-23 DIAGNOSIS — M25561 Pain in right knee: Secondary | ICD-10-CM

## 2016-07-23 DIAGNOSIS — I10 Essential (primary) hypertension: Secondary | ICD-10-CM

## 2016-07-23 DIAGNOSIS — E7800 Pure hypercholesterolemia, unspecified: Secondary | ICD-10-CM

## 2016-07-23 DIAGNOSIS — H409 Unspecified glaucoma: Secondary | ICD-10-CM | POA: Insufficient documentation

## 2016-07-23 MED ORDER — HYDROCHLOROTHIAZIDE 12.5 MG PO CAPS: 12.5000 mg | ORAL_CAPSULE | Freq: Every day | ORAL | 1 refills | Status: DC | PRN

## 2016-07-23 MED ORDER — LORAZEPAM 0.5 MG PO TABS: 0.2500 mg | ORAL_TABLET | Freq: Every evening | ORAL | 0 refills | Status: DC | PRN

## 2016-07-23 NOTE — Assessment & Plan Note (Signed)
BP well controlled Current regimen effective and well tolerated Continue current medications at current doses  

## 2016-07-23 NOTE — Patient Instructions (Addendum)
No immunizations administered today.   Medications reviewed and updated.  No changes recommended at this time.   A referral was ordered for orthopedics  Please followup in 3 motnhs  Continue to eat heart healthy diet (full of fruits, vegetables, whole grains, lean protein, water--limit salt, fat, and sugar intake) and increase physical activity as tolerated.  Continue doing brain stimulating activities (puzzles, reading, adult coloring books, staying active) to keep memory sharp.     Rebecca [name] , Thank you for taking time to come for your Medicare Wellness Visit. I appreciate your ongoing commitment to your health goals. Please review the following plan we discussed and let me know if I can assist you in the future.   These are the goals we discussed: Goals    . Maintain current health status          Continue to eat healthy, be as active as possible, and be independent.       This is a list of the screening recommended for you and due dates:  Health Maintenance  Topic Date Due  . Eye exam for diabetics  06/20/1942  . Tetanus Vaccine  06/20/1953  . DEXA scan (bone density measurement)  06/21/1999  . Flu Shot  10/10/2016  . Hemoglobin A1C  10/21/2016  . Complete foot exam   07/23/2017  . Pneumonia vaccines  Completed    Eating Healthy on a Budget There are many ways to save money at the grocery store and continue to eat healthy. You can be successful if you plan your meals according to your budget, purchase according to your budget and grocery list, and prepare food yourself. How can I buy more food on a limited budget? Plan   Plan meals and snacks according to a grocery list and budget you create.  Look for recipes where you can cook once and make enough food for two meals.  Include meals that will "stretch" more expensive foods such as stews, casseroles, and stir-fry dishes.  Make a grocery list and make sure to bring it with you to the store. If you have a  smart phone, you could use your phone to create your shopping list. Purchase   When grocery shopping, buy only the items on your grocery list and go only to the areas of the store that have the items on your list. Prepare   Some meal items can be prepared in advance. Pre-cook on days when you have extra time.  Make extra food (such as by doubling recipes) and freeze the extras in meal-sized containers or in individual portions for fast meals and snacks.  Use leftovers in your meal plan for the week.  Try some meatless meals or try "no cook" meals like salads.  When you come home from the grocery store, wash and prepare your fruits and vegetables so they are ready to use and eat. This will help reduce food waste. How can I buy more food on a limited budget? Try these tips the next time you go shopping:  [place] store brands or generic brands.  Use coupons only for foods and brands you normally buy. Avoid buying items you wouldn't normally buy simply because they are on sale.  Check online and in newspapers for weekly deals.  Buy healthy items from the bulk bins when available, such as herbs, spices, flours, pastas, nuts, and dried fruit.  Buy fruits and vegetables that are in season. Prices are usually lower on in-season produce.  Compare and  contrast different items. You can do this by looking at the unit price on the price tag. Use it to compare different brands and sizes to find out which item is the best deal.  Choose naturally low-cost healthy items, such as carrots, potatoes, apples, bananas, and oranges. Dried or canned beans are a low-cost protein source.  Buy in bulk and freeze extra food. Items you can buy in bulk include meats, fish, poultry, frozen fruits, and frozen vegetables.  Limit the purchase of prepared or "ready-to-eat" foods, such as pre-cut fruits and vegetables and pre-made salads.  If possible, shop around to discover which grocery store offers the best prices.  Some stores charge much more than other stores for the same items.  Do not shop when you are hungry. If you shop while hungry, It may be hard to stick to your list and budget.  Stick to your list and resist impulse buys. Treat your list as your official plan for the week.  Buy a variety of vegetables and fruit by purchasing fresh, frozen, and canned items.  Look beyond eye level. Foods at eye level (adult or child eye level) are more expensive. Look at the top and bottom shelves for deals.  Be efficient with your time when shopping. The more time you spend at the store, the more money you are likely to spend.  Consider other retailers such as dollar stores, larger [employer]olesale stores, local fruit and vegetable stands, and farmers markets. What are some tips for less expensive food substitutions? When choosing more expensive foods like meats and dairy, try these tips to save money:  Choose cheaper cuts of meat, such as bone-in chicken thighs and drumsticks instead skinless and boneless chicken. When you are ready to prepare the chicken, you can remove the skin yourself to make it healthier.  Choose lean meats like chicken or [place]. When choosing ground beef, make sure it is lean ground beef (92% lean, 8% fat). If you do buy a fattier ground beef, drain the fat before eating.  Buy dried beans and peas, such as lentils, split peas, or kidney beans.  For seafood, choose canned tuna, salmon, or sardines.  Eggs are a low-cost source of protein.  Buy the larger tubs of yogurt instead of individual-sized containers.  Choose water instead of sodas and other sweetened beverages.  Skip buying chips, cookies, and other "junk food". These items are usually expensive, high in calories, and low in nutritional value. How can I prepare the foods I buy in the healthiest way? Practice these tips for cooking foods in the healthiest way to reduce excess fat and calorie intake:  Steam, saute, grill, or  bake foods instead of frying them.  Make sure half your plate is filled with fruits or vegetables. Choose from fresh, frozen, or canned fruits and vegetables. If eating canned, remember to rinse them before eating. This will remove any excess salt added for packaging.  Trim all fat from meat before cooking. Remove the skin from chicken or [place].  Spoon off fat from meat dishes once they have been chilled in the refrigerator and the fat has hardened on the top.  Use skim milk, low-fat milk, or evaporated skim milk when making cream sauces, soups, or puddings.  Substitute low-fat yogurt, sour cream, or cottage cheese for sour cream and mayonnaise in dips and dressings.  Try lemon juice, herbs, or spices to season food instead of salt, butter, or margarine. This information is not intended to replace advice given  to you by your health care provider. Make sure you discuss any questions you have with your health care provider. Document Released: 10/30/2013 Document Revised: 09/16/2015 Document Reviewed: 09/29/2013 Elsevier Interactive Patient Education  2017 [employer].

## 2016-07-23 NOTE — Assessment & Plan Note (Signed)
Eye exam up to date and has one schedule - will have report sent Continue insulin at current dose - concern with hypoglycemia She understands she needs to eat regularly If continues to have hypoglycemia will need to adjust dose Did not like metformin - low dose did not control sugars and she did not call to adjust dose - went back onto insulin and prefers to stay with current medication Will recheck a1c in 3 months

## 2016-07-23 NOTE — Assessment & Plan Note (Signed)
No concerning chest pain or palpitations Following with cardiology Continue current medications

## 2016-07-23 NOTE — Assessment & Plan Note (Signed)
Continue statin - will discuss increasing lipitor to 20 mg at next visit

## 2016-07-23 NOTE — Assessment & Plan Note (Signed)
s/p surgery years ago Has chronic pain from arthritis Wants to wear a back brace as needed for household activities  -- will refer to ortho for their opinion and to get correct brace Deferred PT - she can not afford Uses walker

## 2016-07-23 NOTE — Telephone Encounter (Signed)
During AWV patient shared that she needs a tub chair and that she would like to have a new rolling walker that is adjustable to her height, adding that the one she has does not adjust and causes her to have to bend over to use it, she also wants a larger seat on the new walker.

## 2016-07-24 NOTE — Telephone Encounter (Signed)
Do these have to go through a home health agency?

## 2016-07-26 NOTE — Telephone Encounter (Signed)
Called and informed patient that a rolling walker will not be covered by Medicare at this time because Medicare helped with the cost of her current rolling walker which she received last year. This cost is covered by Medicare every 5 years. Explained that she can go to [facility]'s show room in [place] to receive assistance with purchasing another rolling walker and gave her what the estimated cost would be. [facility] address and phone number where provided. Patient asked about PCP filling out paper work for her to receive a back brace from a company that will bill through [employer]. It was explained to the patient that her PCP did not feel comfortable completing the paper work, as back braces are not her PCP's expertise and the doctor wants to ensure that any back brace for the patient is approved by a physician. An appointment was made for the patient to see Dr. Katrinka BlazingSmith to follow-up regarding her back issues. The patient was informed that a tub chair order will be placed through [facility] and that they will contact her to advised her of the price and/or delivery, pick-up method. Patient verbalized understanding.

## 2016-07-31 NOTE — Addendum Note (Signed)
Addended by: Pincus SanesBURNS, Channah Godeaux J on: 07/31/2016 02:43 PM   Modules accepted: Orders

## 2016-07-31 NOTE — Addendum Note (Signed)
Addended by: Zenovia JordanMITCHELL, TAYLOR B on: 07/31/2016 02:32 PM   Modules accepted: Orders

## 2016-07-31 NOTE — Telephone Encounter (Signed)
Please advise on a DX for the shower chair.

## 2016-08-01 ENCOUNTER — Encounter: Payer: Self-pay | Admitting: Family Medicine

## 2016-08-01 ENCOUNTER — Ambulatory Visit (INDEPENDENT_AMBULATORY_CARE_PROVIDER_SITE_OTHER): Payer: PPO | Admitting: Family Medicine

## 2016-08-01 ENCOUNTER — Ambulatory Visit (INDEPENDENT_AMBULATORY_CARE_PROVIDER_SITE_OTHER)
Admission: RE | Admit: 2016-08-01 | Discharge: 2016-08-01 | Disposition: A | Discharge status: Home / Self Care | Payer: PPO | Source: Ambulatory Visit | Attending: Family Medicine | Admitting: Family Medicine

## 2016-08-01 VITALS — BP 130/62 | HR 57 | Ht 62.0 in | Wt 202.0 lb

## 2016-08-01 DIAGNOSIS — M51369 Other intervertebral disc degeneration, lumbar region without mention of lumbar back pain or lower extremity pain: Secondary | ICD-10-CM | POA: Insufficient documentation

## 2016-08-01 DIAGNOSIS — M545 Low back pain, unspecified: Secondary | ICD-10-CM

## 2016-08-01 DIAGNOSIS — M5136 Other intervertebral disc degeneration, lumbar region: Secondary | ICD-10-CM | POA: Diagnosis not present

## 2016-08-01 DIAGNOSIS — M47816 Spondylosis without myelopathy or radiculopathy, lumbar region: Secondary | ICD-10-CM | POA: Diagnosis not present

## 2016-08-01 MED ORDER — VITAMIN D (ERGOCALCIFEROL) 1.25 MG (50000 UNIT) PO CAPS: 50000.0000 [IU] | ORAL_CAPSULE | ORAL | 0 refills | Status: DC

## 2016-08-01 NOTE — Patient Instructions (Addendum)
Good to see you.  Xray downstairs today  Ice 20 minutes 2 times daily. Usually after activity and before bed. Exercises 3 times a week.  Once weekly vitamin D for 12 weeks stop the daily  Try the new walker that is higher Continue the turmeric but make sure 500mg  daily  Tart cherry extract any dose would be good at night Spenco orthotics "total support" online would be great can help absorb some of the impact on the back.  See me again in 4 weeks

## 2016-08-01 NOTE — Progress Notes (Signed)
[facility] [address] [place], [place] [address] Phone: [phone] Subjective:    I'm seeing this patient by the request  of:  Pincus Sanes, MD   CC: Low back pain  [id number]  Rebecca [name] is a 83 y.o. female coming in with complaint of low back pain.  Surgery in 1991.  Patient was doing very well until the last year. Patient states that since then has had chronic increasing low back pain. States that it affects daily activities. Patient does ambulate with the aid of a walker. Feels that the walkers to low and could be contribute in. Patient states that she feels that a brace could be beneficial. Denies any radiation down the legs. Denies any numbness on the time. States that it just more of a pain that seems to stay localized. Rates the severity of pain a 6 out of 10 and worsening. No nighttime awakening. Denies fever, chills, any abnormal weight loss   last x-rays of lumbar spine were taken in 2003. These were independently visualized by me. Patient did have degenerative disc disease noted with spondylolisthesis at L4 on L5. As well as some at L5-S1. Mild facet arthritic changes.  Past Medical History:  Diagnosis Date  . Anemia, unspecified   . Ankle pain, left   . CAD (coronary artery disease)   . Esophageal reflux   . Fibrocystic breast disease   . Hiatal hernia   . Hypercholesteremia   . Hypertension   . IDDM (insulin dependent diabetes mellitus) (HCC)    Type II  . Multinodular goiter   . Sickle-cell trait [facility])    Past Surgical History:  Procedure Laterality Date  . "FEMALE"  1976  . BACK SURGERY  1990  . BIOPSY THYROID     Dr.Ellison-Neg   . CORONARY ANGIOPLASTY    . FOOT SURGERY  2004  . RETINAL LASER PROCEDURE     Social History   Social History  . Marital status: Widowed    Spouse name: N/A  . Number of children: N/A  . Years of education: N/A   Social History Main Topics  . Smoking status: Never Smoker  .  Smokeless tobacco: Never Used  . Alcohol use No  . Drug use: No  . Sexual activity: Not on file   Other Topics Concern  . Not on file   Social History Narrative   Widowed   Gets regular exercise   Allergies  Allergen Reactions  . Codeine     "Makes me drunk"   Family History  Problem Relation Age of Onset  . Hypertension Unknown   . Lung cancer Unknown   . Heart disease Unknown   . Gout Sister   . Gout Brother   . Heart attack Father   . Heart attack Mother   . Gout Brother     Past medical history, social, surgical and family history all reviewed in electronic medical record.  No pertanent information unless stated regarding to the chief complaint.   Review of Systems:Review of systems updated and as accurate as of 08/01/16  No headache, visual changes, nausea, vomiting, diarrhea,  dizziness, abdominal pain, skin rash, fevers, chills, night sweats, weight loss, swollen lymph nodes,  chest pain, shortness of breath, mood changes.  Positive body aches, muscle aches positive constipation  Objective  There were no vitals taken for this visit. Systems examined below as of 08/01/16   General: No apparent distress alert and oriented x3 mood  and affect normal, dressed appropriately.  HEENT: Pupils equal, extraocular movements intact  Respiratory: Patient's speak in full sentences and does not appear short of breath  Cardiovascular: No lower extremity edema, non tender, no erythema  Skin: Warm dry intact with no signs of infection or rash on extremities or on axial skeleton.  Abdomen: Soft nontender  Neuro: Cranial nerves II through XII are intact, neurovascularly intact in all extremities with 2+ DTRs and 2+ pulses.  Lymph: No lymphadenopathy of posterior or anterior cervical chain or axillae bilaterally.  Gait Antalgic gait walking with the aid of a walker MSK:  Mild tender with full range of motion and good stability and symmetric strength and tone of shoulders, elbows,  wrist, hip, knee and ankles bilaterally. Arthritic changes of multiple joints  Back Exam:  Inspection: Severe kyphosis noted. Motion: Flexion 25 deg, Extension 10 deg, Side Bending to 15 deg bilaterally,  Rotation to 15 deg bilaterally  SLR laying: Negative  XSLR laying: Negative  Palpable tenderness: Moderate tenderness in the paraspinal musculature of the lumbar spine mostly of the lumbosacral area.Marland Kitchen. FABER: negative. Able to do and has stiffness but no radicular symptoms Sensory change: Gross sensation intact to all lumbar and sacral dermatomes.  Reflexes: 2+ at both patellar tendons, 2+ at achilles tendons, Babinski's downgoing.  Strength at foot  4 out of 5 but symmetric  Procedure note 97110; 15 minutes spent for Therapeutic exercises as stated in above notes.  This included exercises focusing on stretching, strengthening, with significant focus on eccentric aspects.Low back exercises that included:  Pelvic tilt/bracing instruction to focus on control of the pelvic girdle and lower abdominal muscles  Glute strengthening exercises, focusing on proper firing of the glutes without engaging the low back muscles avoided exercises where patient gets on the ground.  Proper stretching techniques for maximum relief for the hamstrings, hip flexors, low back and some rotation where tolerated     Proper technique shown and discussed handout in great detail with ATC.  All questions were discussed and answered.       Impression and Recommendations:     This case required medical decision making of moderate complexity.      Note: This dictation was prepared with Dragon dictation along with smaller phrase technology. Any transcriptional errors that result from this process are unintentional.

## 2016-08-01 NOTE — Assessment & Plan Note (Signed)
Patient has severe degenerative disc disease of the lumbar spine. Discussed with patient at great length. We discussed icing regimen and home exercises. Patient wants to avoid any type of aggressive therapies. We discussed over-the-counter medications. Once weekly vitamin D given for muscle strength and endurance. Patient will get new x-rays with last x-rays within the computer system being from 2003. Patient will try this conservative therapy and see me again in 3-4 weeks. At that time worsening symptoms possible advance imaging or formal physical therapy.

## 2016-08-02 DIAGNOSIS — Z961 Presence of intraocular lens: Secondary | ICD-10-CM | POA: Diagnosis not present

## 2016-08-02 DIAGNOSIS — H401132 Primary open-angle glaucoma, bilateral, moderate stage: Secondary | ICD-10-CM | POA: Diagnosis not present

## 2016-08-02 DIAGNOSIS — H40053 Ocular hypertension, bilateral: Secondary | ICD-10-CM | POA: Diagnosis not present

## 2016-08-02 DIAGNOSIS — E1165 Type 2 diabetes mellitus with hyperglycemia: Secondary | ICD-10-CM | POA: Diagnosis not present

## 2016-08-02 LAB — HM DIABETES EYE EXAM

## 2016-08-07 ENCOUNTER — Encounter: Payer: Self-pay | Admitting: Internal Medicine

## 2016-08-07 NOTE — Progress Notes (Unsigned)
Results entered and sent to scan  

## 2016-08-13 ENCOUNTER — Other Ambulatory Visit: Payer: Self-pay | Admitting: Internal Medicine

## 2016-08-27 ENCOUNTER — Encounter: Payer: Self-pay | Admitting: Family Medicine

## 2016-08-27 ENCOUNTER — Ambulatory Visit (INDEPENDENT_AMBULATORY_CARE_PROVIDER_SITE_OTHER): Payer: PPO | Admitting: Family Medicine

## 2016-08-27 VITALS — BP 120/64 | HR 68 | Ht 62.0 in | Wt 207.0 lb

## 2016-08-27 DIAGNOSIS — M5136 Other intervertebral disc degeneration, lumbar region: Secondary | ICD-10-CM | POA: Diagnosis not present

## 2016-08-27 DIAGNOSIS — M51369 Other intervertebral disc degeneration, lumbar region without mention of lumbar back pain or lower extremity pain: Secondary | ICD-10-CM

## 2016-08-27 NOTE — Progress Notes (Signed)
[facility] [address] [place], [place] [address] Phone: [phone] Subjective:    I'm seeing this patient by the request  of:  Pincus Sanes, MD   CC: Low back pain f/u  [id number]  Rebecca [name] is a 83 y.o. female coming in with complaint of low back pain.  Surgery in 1991.   Patient was seen previously and was having some lumbar radiculopathy. Patient was having signs and symptoms that is consistent with more of a spinal stenosis. Patient was sent for x-rays. X-rays that show that significant osteophytic changes with bilateral pars defect on L4 that was causing some spondylolisthesis. Patient was to do home exercises, over-the-counter medications, as well as a low dose of gabapentin at night. Patient states very minimal improvement overall. Patient states that she has been doing the exercises but very minimal. Continues to walk with the aid of a walker. States that the pain is still unrelenting. Cannot walk more than 200 feet without stopping. Sometimes very sore at night as well.   l  Past Medical History:  Diagnosis Date  . Anemia, unspecified   . Ankle pain, left   . CAD (coronary artery disease)   . Esophageal reflux   . Fibrocystic breast disease   . Hiatal hernia   . Hypercholesteremia   . Hypertension   . IDDM (insulin dependent diabetes mellitus) (HCC)    Type II  . Multinodular goiter   . Sickle-cell trait [facility])    Past Surgical History:  Procedure Laterality Date  . "FEMALE"  1976  . BACK SURGERY  1990  . BIOPSY THYROID     Dr.Ellison-Neg   . CORONARY ANGIOPLASTY    . FOOT SURGERY  2004  . RETINAL LASER PROCEDURE     Social History   Social History  . Marital status: Widowed    Spouse name: N/A  . Number of children: N/A  . Years of education: N/A   Social History Main Topics  . Smoking status: Never Smoker  . Smokeless tobacco: Never Used  . Alcohol use No  . Drug use: No  . Sexual activity: Not Asked     Other Topics Concern  . None   Social History Narrative   Widowed   Gets regular exercise   Allergies  Allergen Reactions  . Codeine     "Makes me drunk"   Family History  Problem Relation Age of Onset  . Hypertension Unknown   . Lung cancer Unknown   . Heart disease Unknown   . Gout Sister   . Gout Brother   . Heart attack Father   . Heart attack Mother   . Gout Brother     Past medical history, social, surgical and family history all reviewed in electronic medical record.  No pertanent information unless stated regarding to the chief complaint.   Review of Systems: No headache, visual changes, nausea, vomiting, diarrhea, constipation, dizziness, abdominal pain, skin rash, fevers, chills, night sweats, weight loss, swollen lymph nodes, chest pain, shortness of breath, mood changes.  Positive muscle aches body aches  Objective  Blood pressure 120/64, pulse 68, height 5\' 2"  (1.575 m), weight 207 lb (93.9 kg), SpO2 95 %.   Systems examined below as of 08/27/16 General: NAD A&O x3 mood, affect normal  HEENT: Pupils equal, extraocular movements intact no nystagmusWearing glasses Respiratory: not short of breath at rest or with speaking Cardiovascular: No lower extremity edema, non tender Skin: Warm dry intact with  no signs of infection or rash on extremities or on axial skeleton. Abdomen: Soft nontender, no masses Neuro: Cranial nerves  intact, neurovascularly intact in all extremities with 2+ DTRs and 2+ pulses. Lymph: No lymphadenopathy appreciated today  GaitWalks with a 8 of a walker MSK: Non tender with full range of motion and good stability and symmetric strength and tone of shoulders, elbows, wrist,  knee hips and ankles bilaterally.  Significant arthritic changes of multiple joints  Back Exam:  Inspection: Loss of lordosis Motion: Flexion 25 deg, Extension 25 deg, Side Bending to 35 deg bilaterally,  Rotation to 30 deg bilaterally  SLR laying: Negative  XSLR  laying: Negative  Palpable tenderness: Increasing tenderness to palpation in the paraspinal musculature. FABER: negative. Sensory change: Gross sensation intact to all lumbar and sacral dermatomes.  Reflexes: 2+ at both patellar tendons, 2+ at achilles tendons, Babinski's downgoing.  Strength at foot  Plantar-flexion: 5/5 Dorsi-flexion: 5/5 Eversion: 5/5 Inversion: 5/5  Leg strength  Quad: 5/5 Hamstring: 5/5 Hip flexor: 5/5 Hip abductors: 4/5  Gait unremarkable.    Impression and Recommendations:     This case required medical decision making of moderate complexity.      Note: This dictation was prepared with Dragon dictation along with smaller phrase technology. Any transcriptional errors that result from this process are unintentional.

## 2016-08-27 NOTE — Patient Instructions (Signed)
Good to see you  We will see if we can get home physical therapy and see if they can get you a price.  Continue the medications.  Continue the vitamins Try pennsaid pinkie amount topically 2 times daily as needed.   If it worsens we will need to consider MRI and maybe the injections.  I wish I had the magic wand.  See me again In 6-8 weeks.

## 2016-08-27 NOTE — Assessment & Plan Note (Signed)
Severe with progression noted after numerous x-rays were in a pain we visualized by me. We discussed icing regimen, home exercises, which activities to do in which ones to avoid. We discussed core strengthening. Patient will have home health physical therapy secondary to ambulation problems as well as driving difficulties. We discussed the possibility of advanced imaging and injections with patient has declined at this moment. We will continue to monitor closely. Follow-up again in 6-8 weeks.  Spent  25 minutes with patient face-to-face and had greater than 50% of counseling including as described above in assessment and plan. We discussed that we cannot do any significant medication changes with patient's other comorbidities. We discussed so advance imaging as well as epidural would be her likely best option the patient is concerned that this will cause her to have difficulty with blood sugar. Discussed with patient that surgical intervention is an option which patient also declined. We'll start with the home health PT.

## 2016-08-29 ENCOUNTER — Other Ambulatory Visit: Payer: Self-pay | Admitting: Internal Medicine

## 2016-08-29 ENCOUNTER — Telehealth: Payer: Self-pay | Admitting: *Deleted

## 2016-08-29 NOTE — Telephone Encounter (Signed)
Spoke to pt, advised her medicare does not cover this med & samples will be left at front desk for her to come by & pick up.

## 2016-08-29 NOTE — Telephone Encounter (Signed)
Rec'd call pt states saw Dr. Katrinka BlazingSmith on Monday, he gave sample of pennsaid would like rx call into her [facility] pharmacy...Raechel Chute/lmb

## 2016-08-30 ENCOUNTER — Telehealth: Payer: Self-pay | Admitting: Internal Medicine

## 2016-08-30 NOTE — Telephone Encounter (Signed)
[facility] faxed over a form for the Pts knee and ankle brace they are calling to see if you received this   [phone]

## 2016-08-30 NOTE — Telephone Encounter (Signed)
Spoke to pt, she would like to wait on the bracing until she follow's up with dr. Katrinka BlazingSmith. Faxed back orders to make [facility] aware.

## 2016-09-03 ENCOUNTER — Telehealth: Payer: Self-pay | Admitting: Internal Medicine

## 2016-09-03 NOTE — Telephone Encounter (Signed)
No, probabaly the cheapest we can get at the moment.

## 2016-09-03 NOTE — Telephone Encounter (Signed)
Kesha from [facility] called to let Dr Katrinka BlazingSmith know that a physical therapist will be going out tomorrow to start home health services with the pt.

## 2016-09-03 NOTE — Telephone Encounter (Signed)
Spoke to Rebecca RiverKesha, she states pt will have to pay a $25 copay everytime they come out & pt cannot afford that. She would like to know if there are any other options for a pt.

## 2016-09-06 NOTE — Telephone Encounter (Signed)
Spoke to pt & advised her we will discuss when she returns for an OV.

## 2016-09-26 ENCOUNTER — Other Ambulatory Visit: Payer: Self-pay | Admitting: Internal Medicine

## 2016-10-03 ENCOUNTER — Ambulatory Visit (INDEPENDENT_AMBULATORY_CARE_PROVIDER_SITE_OTHER): Payer: PPO | Admitting: Ophthalmology

## 2016-10-03 DIAGNOSIS — E113593 Type 2 diabetes mellitus with proliferative diabetic retinopathy without macular edema, bilateral: Secondary | ICD-10-CM

## 2016-10-03 DIAGNOSIS — H35033 Hypertensive retinopathy, bilateral: Secondary | ICD-10-CM | POA: Diagnosis not present

## 2016-10-03 DIAGNOSIS — I10 Essential (primary) hypertension: Secondary | ICD-10-CM

## 2016-10-03 DIAGNOSIS — E11319 Type 2 diabetes mellitus with unspecified diabetic retinopathy without macular edema: Secondary | ICD-10-CM | POA: Diagnosis not present

## 2016-10-03 DIAGNOSIS — H43813 Vitreous degeneration, bilateral: Secondary | ICD-10-CM

## 2016-10-21 NOTE — Progress Notes (Signed)
Rebecca ScaleZach [name] D.O. [facility] [address] [place], [place] [address] Phone: [phone] Subjective:    I'm seeing this patient by the request  of:  Pincus SanesBurns, Stacy J, MD   CC: Low back pain f/u  BJY:[id number]:Subjective  Rebecca [name] Omara is a 83 y.o. female coming in with complaint of low back pain.  Surgery in 1991.   Patient was seen previously and was having some lumbar radiculopathy. Patient was having signs and symptoms that is consistent with more of a spinal stenosis. Patient was sent for x-rays. X-rays that show that significant osteophytic changes with bilateral pars defect on L4 that was causing some spondylolisthesis.  Patient was making little improvement started then HHPT, and continued all other conservative therapy  Patient states unable to afford PT.  Still having pain  No change possibly more muscle soreness.     l  Past Medical History:  Diagnosis Date  . Anemia, unspecified   . Ankle pain, left   . CAD (coronary artery disease)   . Esophageal reflux   . Fibrocystic breast disease   . Hiatal hernia   . Hypercholesteremia   . Hypertension   . IDDM (insulin dependent diabetes mellitus) (HCC)    Type II  . Multinodular goiter   . Sickle-cell trait [facility](HCC)    Past Surgical History:  Procedure Laterality Date  . "FEMALE"  1976  . BACK SURGERY  1990  . BIOPSY THYROID     Dr.Ellison-Neg   . CORONARY ANGIOPLASTY    . FOOT SURGERY  2004  . RETINAL LASER PROCEDURE     Social History   Social History  . Marital status: Widowed    Spouse name: N/A  . Number of children: N/A  . Years of education: N/A   Social History Main Topics  . Smoking status: Never Smoker  . Smokeless tobacco: Never Used  . Alcohol use No  . Drug use: No  . Sexual activity: Not Asked   Other Topics Concern  . None   Social History Narrative   Widowed   Gets regular exercise   Allergies  Allergen Reactions  . Codeine     "Makes me drunk"   Family History  Problem  Relation Age of Onset  . Hypertension Unknown   . Lung cancer Unknown   . Heart disease Unknown   . Gout Sister   . Gout Brother   . Heart attack Father   . Heart attack Mother   . Gout Brother     Past medical history, social, surgical and family history all reviewed in electronic medical record.  No pertanent information unless stated regarding to the chief complaint.   Review of Systems: No headache, visual changes, nausea, vomiting, diarrhea, constipation, dizziness, abdominal pain, skin rash, fevers, chills, night sweats, weight loss, swollen lymph nodes,, joint swelling, chest pain, shortness of breath, mood changes.  +muscle aches and body aches   Objective  Blood pressure 130/60, pulse 64, height 5\' 2"  (1.575 m), weight 204 lb (92.5 kg), SpO2 97 %.   Systems examined below as of 10/22/16 General: NAD A&O x3 mood, affect normal  HEENT: Pupils equal, extraocular movements intact no nystagmus Respiratory: not short of breath at rest or with speaking Cardiovascular: No lower extremity edema, non tender Skin: Warm dry intact with no signs of infection or rash on extremities or on axial skeleton. Abdomen: Soft nontender, no masses Neuro: Cranial nerves  intact, neurovascularly intact in all extremities with 2+ DTRs and  2+ pulses. Lymph: No lymphadenopathy appreciated today  GaitWalks with a 8 of a walker MSK: Non tender with full range of motion and good stability and symmetric strength and tone of shoulders, elbows, wrist,  knee hips and ankles bilaterally.  Significant arthritic changes of multiple joints  Back Exam:  Inspection: increase kyphosis.  Motion: Flexion 25 deg, Extension 25 deg, Side Bending to 25 deg bilaterally,  Rotation to 25 deg bilaterally  SLR laying: Negative  XSLR laying: Negative  Palpable tenderness: Tender to palpation appears musculature lumbar spine. Worse than previous examination FABER: unable to do due ot tightness. . Sensory change: Gross  sensation intact to all lumbar and sacral dermatomes.  Reflexes: 2+ at both patellar tendons, 2+ at achilles tendons, Babinski's downgoing.  Strength at foot  4/5 and symmetric     Impression and Recommendations:     This case required medical decision making of moderate complexity.      Note: This dictation was prepared with Dragon dictation along with smaller phrase technology. Any transcriptional errors that result from this process are unintentional.

## 2016-10-22 ENCOUNTER — Ambulatory Visit (INDEPENDENT_AMBULATORY_CARE_PROVIDER_SITE_OTHER): Payer: PPO | Admitting: Family Medicine

## 2016-10-22 ENCOUNTER — Encounter: Payer: Self-pay | Admitting: Family Medicine

## 2016-10-22 DIAGNOSIS — M5136 Other intervertebral disc degeneration, lumbar region: Secondary | ICD-10-CM

## 2016-10-22 MED ORDER — NAPROXEN 500 MG PO TABS: 500.0000 mg | ORAL_TABLET | Freq: Two times a day (BID) | ORAL | 3 refills | Status: DC

## 2016-10-22 NOTE — Patient Instructions (Signed)
Good to see you  Ice Is your friend CoQ10 400mg  with your lipitor  Stop the meloxicam and try naproxen 500mg  twice daily as needed Possibly increase gabapentin to 300mg  at night Stop the weekly vitmain D and go back to the daily  Stop the Vitamin E  [facility]  [phone]  See me again in 2 months

## 2016-10-22 NOTE — Assessment & Plan Note (Signed)
No improvement. Patient continues to use a walker that is too small. Given her number for other places. We also made changes in her anti-inflammatory. We discussed different diet changes and some other over-the-counter medications that she should discontinue. Patient once again declined any type of formal physical therapy. We discussed and answered imaging for further evaluation for the potential spinal stenosis. Patient will come back and see me again in 8 weeks.

## 2016-10-28 NOTE — Progress Notes (Signed)
Subjective:    Patient ID: Rebecca [name], female    DOB: April 28, 1932, 83 y.o.   MRN: [id number]  HPI The patient is here for follow up.  INC lipitor, add metformin  Diabetes: She is taking her medication daily as prescribed. She is compliant with a diabetic diet. She is exercising minimally - bed exercise; can not walk too long. She monitors her sugars and they have been running 86 today, occasionally low sugars if she does not eat then she will eat a lot and it will go up to 200 . She checks her feet daily and denies foot lesions. She is up-to-date with an ophthalmology examination.   Numbness in hands and feet:  She is taking gabapentin 200 mg nightly.  Her numbness is better.  She could not tolerate 300 mg.    CAD, Hypertension: She is taking her medication daily. She is compliant with a low sodium diet.  She denies chest pain, palpitations, and regular headaches. She is exercising.      Hyperlipidemia: She is taking her medication daily. She is compliant with a low fat/cholesterol diet. She is exercising some. She denies myalgias.   GERD:  She is taking her medication daily as prescribed.  She denies any GERD symptoms and feels her GERD is well controlled.    Medications and allergies reviewed with patient and updated if appropriate.  Patient Active Problem List   Diagnosis Date Noted  . Degenerative disc disease, lumbar 08/01/2016  . Glaucoma 07/23/2016  . Numbness 04/23/2016  . Chronic pain of right knee 12/28/2015  . Lower back pain 10/24/2015  . Pain in both feet 10/24/2015  . Abnormality of gait 04/01/2013  . Posterior tibial tendon dysfunction 04/01/2013  . Diabetes ([place]) 04/01/2013  . Ankle pain, left 10/20/2010  . UNSPECIFIED VITAMIN D DEFICIENCY 11/08/2008  . EDEMA- LOCALIZED 11/08/2008  . Sickle-cell trait ([place]) 01/01/2008  . Esophageal reflux 09/02/2007  . GOITER, MULTINODULAR 11/26/2006  . Coronary atherosclerosis 11/26/2006  . HIATAL HERNIA 11/26/2006    . FIBROCYSTIC BREAST DISEASE 07/31/2006  . HYPERCHOLESTEROLEMIA 05/27/2006  . Essential hypertension 05/27/2006    Current Outpatient Prescriptions on File Prior to Visit  Medication Sig Dispense Refill  . Alcohol Swabs (B-D SINGLE USE SWABS REGULAR) PADS     . amLODipine (NORVASC) 10 MG tablet TAKE ONE TABLET BY MOUTH ONCE DAILY 90 tablet 2  . Ascorbic Acid (VITAMIN C) 500 MG tablet Take 500 mg by mouth 2 (two) times daily.      Marland Kitchen aspirin 81 MG tablet Take 81 mg by mouth daily.      Marland Kitchen atorvastatin (LIPITOR) 10 MG tablet Take 1 tablet (10 mg total) by mouth daily. 90 tablet 3  . Blood Glucose Monitoring Suppl (ONE TOUCH ULTRA 2) w/Device KIT Use to check blood sugars twice a day Dx e11.9 1 each 0  . calcium carbonate (OS-CAL) 600 MG TABS Take 600 mg by mouth daily.     . Cholecalciferol (VITAMIN D3) 5000 UNITS CAPS Take 5,000 Units by mouth daily.      Marland Kitchen Cod Liver Oil 1000 MG CAPS Take 1,000 mg by mouth daily. During the winter    . gabapentin (NEURONTIN) 100 MG capsule TAKE 1 CAPSULE BY MOUTH AT BEDTIME 90 capsule 0  . glucose blood (ACCU-CHEK SMARTVIEW) test strip Use to check blood sugars twice a day Dx E11.9 200 each 3  . glucose blood (ONE TOUCH ULTRA TEST) test strip 1 each by Other route 2 (two)  times daily. Use to check blood sugars twice a day Dx E11.9 100 each 5  . hydrochlorothiazide (MICROZIDE) 12.5 MG capsule Take 1 capsule (12.5 mg total) by mouth daily as needed. 90 capsule 1  . Isopropyl Alcohol (PHARMACIST CHOICE ALCOHOL) 70 % MISC Use with Glucose meter to check sugars daily DX E11.42 100 each 1  . isosorbide mononitrate (IMDUR) 30 MG 24 hr tablet TAKE ONE TABLET BY MOUTH ONCE DAILY 90 tablet 3  . Lancets (ONETOUCH ULTRASOFT) lancets 1 each by Other route 2 (two) times daily. Use to help check blood sugars twice a day Dx E11.9 100 each 5  . latanoprost (XALATAN) 0.005 % ophthalmic solution 1 drop at bedtime.    . lidocaine (LIDODERM) 5 % Place 1 patch onto the skin daily.  Remove & Discard patch within 12 hours or as directed by MD 30 patch 1  . LORazepam (ATIVAN) 0.5 MG tablet Take 0.5 tablets (0.25 mg total) by mouth at bedtime as needed for anxiety. for anxiety 30 tablet 0  . meclizine (ANTIVERT) 25 MG tablet Take 1/2 to 1 tab three times a day as needed 15 tablet 0  . Multiple Vitamin (MULTIVITAMIN) tablet Take 1 tablet by mouth daily.      . naproxen (NAPROSYN) 500 MG tablet Take 1 tablet (500 mg total) by mouth 2 (two) times daily with a meal. 60 tablet 3  . nitroGLYCERIN (NITROSTAT) 0.4 MG SL tablet DISSOLVE ONE TABLET UNDER TONGUE AS DIRECTED FOR CHEST PAIN 25 tablet 5  . NOVOLIN 70/30 RELION (70-30) 100 UNIT/ML injection Take 15 units with breakfast and 5 units with evening meals 10 mL 5  . omeprazole (PRILOSEC) 20 MG capsule TAKE ONE CAPSULE BY MOUTH IN THE MORNING 90 capsule 3  . quinapril (ACCUPRIL) 40 MG tablet TAKE ONE TABLET BY MOUTH ONCE DAILY 90 tablet 3   No current facility-administered medications on file prior to visit.     Past Medical History:  Diagnosis Date  . Anemia, unspecified   . Ankle pain, left   . CAD (coronary artery disease)   . Esophageal reflux   . Fibrocystic breast disease   . Hiatal hernia   . Hypercholesteremia   . Hypertension   . IDDM (insulin dependent diabetes mellitus) ([place])    Type II  . Multinodular goiter   . Sickle-cell trait [name])     Past Surgical History:  Procedure Laterality Date  . "FEMALE"  1976  . BACK SURGERY  1990  . BIOPSY THYROID     Dr.Ellison-Neg   . CORONARY ANGIOPLASTY    . FOOT SURGERY  2004  . RETINAL LASER PROCEDURE      Social History   Social History  . Marital status: Widowed    Spouse name: N/A  . Number of children: N/A  . Years of education: N/A   Social History Main Topics  . Smoking status: Never Smoker  . Smokeless tobacco: Never Used  . Alcohol use No  . Drug use: No  . Sexual activity: Not on file   Other Topics Concern  . Not on file   Social  History Narrative   Widowed   Gets regular exercise    Family History  Problem Relation Age of Onset  . Hypertension Unknown   . Lung cancer Unknown   . Heart disease Unknown   . Gout Sister   . Gout Brother   . Heart attack Father   . Heart attack Mother   . Gout Brother  Review of Systems  Constitutional: Negative for chills and fever.  Respiratory: Positive for shortness of breath (with exertion at times). Negative for cough and wheezing.   Cardiovascular: Positive for leg swelling (sometimes in left ankle - chronic). Negative for chest pain and palpitations.  Neurological: Negative for light-headedness and headaches.       Objective:   Vitals:   10/29/16 0934  BP: 134/62  Pulse: 60  Resp: 16  Temp: 98 F (36.7 C)  SpO2: 98%   Wt Readings from Last 3 Encounters:  10/29/16 206 lb (93.4 kg)  10/22/16 204 lb (92.5 kg)  08/27/16 207 lb (93.9 kg)   Body mass index is 37.68 kg/m.   Physical Exam    Constitutional: Appears well-developed and well-nourished. No distress.  HENT:  Head: Normocephalic and atraumatic.  Neck: Neck supple. No tracheal deviation present. No thyromegaly present.  No cervical lymphadenopathy Cardiovascular: Normal rate, regular rhythm and normal heart sounds.   No murmur heard. No carotid bruit .  1+ b/l LE edema Pulmonary/Chest: Effort normal and breath sounds normal. No respiratory distress. No has no wheezes. No rales.  Skin: Skin is warm and dry. Not diaphoretic.  Psychiatric: Normal mood and affect. Behavior is normal.      Assessment & Plan:    See Problem List for Assessment and Plan of chronic medical problems.

## 2016-10-28 NOTE — Patient Instructions (Addendum)
  Test(s) ordered today. Your results will be released to MyChart (or called to you) after review, usually within 72hours after test completion. If any changes need to be made, you will be notified at that same time.  All other Health Maintenance issues reviewed.   All recommended immunizations and age-appropriate screenings are up-to-date or discussed.  No immunizations administered today.   Medications reviewed and updated.  Changes include increasing atorvastatin to 20 mg daily.   Please followup in 6 months

## 2016-10-29 ENCOUNTER — Other Ambulatory Visit (INDEPENDENT_AMBULATORY_CARE_PROVIDER_SITE_OTHER): Payer: PPO

## 2016-10-29 ENCOUNTER — Encounter: Payer: Self-pay | Admitting: Internal Medicine

## 2016-10-29 ENCOUNTER — Ambulatory Visit (INDEPENDENT_AMBULATORY_CARE_PROVIDER_SITE_OTHER): Payer: PPO | Admitting: Internal Medicine

## 2016-10-29 VITALS — BP 134/62 | HR 60 | Temp 98.0°F | Resp 16 | Wt 206.0 lb

## 2016-10-29 DIAGNOSIS — E7800 Pure hypercholesterolemia, unspecified: Secondary | ICD-10-CM

## 2016-10-29 DIAGNOSIS — E1142 Type 2 diabetes mellitus with diabetic polyneuropathy: Secondary | ICD-10-CM

## 2016-10-29 DIAGNOSIS — I10 Essential (primary) hypertension: Secondary | ICD-10-CM

## 2016-10-29 DIAGNOSIS — R200 Anesthesia of skin: Secondary | ICD-10-CM

## 2016-10-29 DIAGNOSIS — G4700 Insomnia, unspecified: Secondary | ICD-10-CM | POA: Diagnosis not present

## 2016-10-29 DIAGNOSIS — K219 Gastro-esophageal reflux disease without esophagitis: Secondary | ICD-10-CM

## 2016-10-29 DIAGNOSIS — I2510 Atherosclerotic heart disease of native coronary artery without angina pectoris: Secondary | ICD-10-CM | POA: Diagnosis not present

## 2016-10-29 DIAGNOSIS — Z794 Long term (current) use of insulin: Secondary | ICD-10-CM

## 2016-10-29 LAB — LIPID PANEL
Cholesterol: 162 mg/dL (ref 0–200)
HDL: 75.6 mg/dL (ref >39.00)
LDL Cholesterol: 71 mg/dL (ref 0–99)
NonHDL: 86.01
Total CHOL/HDL Ratio: 2
Triglycerides: 74 mg/dL (ref 0.0–149.0)
VLDL: 14.8 mg/dL (ref 0.0–40.0)

## 2016-10-29 LAB — COMPREHENSIVE METABOLIC PANEL
ALT: 12 U/L (ref 0–35)
AST: 15 U/L (ref 0–37)
Albumin: 3.5 g/dL (ref 3.5–5.2)
Alkaline Phosphatase: 47 U/L (ref 39–117)
BUN: 30 mg/dL — ABNORMAL HIGH (ref 6–23)
CO2: 27 mEq/L (ref 19–32)
Calcium: 9.3 mg/dL (ref 8.4–10.5)
Chloride: 108 mEq/L (ref 96–112)
Creatinine, Ser: 1.1 mg/dL (ref 0.40–1.20)
GFR: 61.1 mL/min (ref >60.00)
Glucose, Bld: 99 mg/dL (ref 70–99)
Potassium: 4.7 mEq/L (ref 3.5–5.1)
Sodium: 141 mEq/L (ref 135–145)
Total Bilirubin: 0.4 mg/dL (ref 0.2–1.2)
Total Protein: 6.9 g/dL (ref 6.0–8.3)

## 2016-10-29 LAB — HEMOGLOBIN A1C: Hgb A1c MFr Bld: 7.5 % — ABNORMAL HIGH (ref 4.6–6.5)

## 2016-10-29 MED ORDER — GABAPENTIN 100 MG PO CAPS: 200.0000 mg | ORAL_CAPSULE | Freq: Every day | ORAL | 1 refills | Status: DC

## 2016-10-29 MED ORDER — ATORVASTATIN CALCIUM 20 MG PO TABS: 20.0000 mg | ORAL_TABLET | Freq: Every day | ORAL | 3 refills | Status: DC

## 2016-10-29 NOTE — Assessment & Plan Note (Signed)
Taking gabapentin 200 mg nightly  Improved continue

## 2016-10-29 NOTE — Assessment & Plan Note (Signed)
Takes 1/2 of a pill as needed Does not take often Discussed risk of falling and memory issues Use infrequently

## 2016-10-29 NOTE — Assessment & Plan Note (Signed)
Check a1c Low sugar / carb diet Stressed regular exercise Stressed eating regularly to avoid low sugars

## 2016-10-29 NOTE — Assessment & Plan Note (Signed)
GERD controlled Has GERD if she misses a dose Continue daily medication

## 2016-10-29 NOTE — Assessment & Plan Note (Signed)
LDL > 70 Increase lipitor to 20 mg daily Check cmp, lipid

## 2016-10-29 NOTE — Assessment & Plan Note (Addendum)
No chest pain, some SOB with exertion - likely not cardiac related Continue ASA daily Increase lipitor to 20 mg daily

## 2016-10-29 NOTE — Assessment & Plan Note (Signed)
BP well controlled Current regimen effective and well tolerated Continue current medications at current doses cmp  

## 2016-11-26 ENCOUNTER — Other Ambulatory Visit: Payer: Self-pay | Admitting: Internal Medicine

## 2016-11-26 NOTE — Telephone Encounter (Signed)
Last filled 10/29/16 with 1 refill

## 2016-12-24 ENCOUNTER — Encounter: Payer: Self-pay | Admitting: Family Medicine

## 2016-12-24 ENCOUNTER — Ambulatory Visit (INDEPENDENT_AMBULATORY_CARE_PROVIDER_SITE_OTHER): Payer: PPO | Admitting: Family Medicine

## 2016-12-24 DIAGNOSIS — M5136 Other intervertebral disc degeneration, lumbar region: Secondary | ICD-10-CM | POA: Diagnosis not present

## 2016-12-24 MED ORDER — LIDOCAINE 5 % EX PTCH: 1.0000 | MEDICATED_PATCH | CUTANEOUS | 1 refills | Status: DC

## 2016-12-24 NOTE — Assessment & Plan Note (Signed)
Patient continues to have some pain. Pain can stop her from activity. Feels like she has some more support with certain activities she would feel better. Patient was written for a back brace that I think will be beneficial. We discussed no avoid wearing it all the time. Patient will continue with the home exercises in the medications at this point. Follow-up again in 3-4 months.

## 2016-12-24 NOTE — Progress Notes (Signed)
[facility] [address] [place], [place] [address] Phone: [phone] Subjective:    I'm seeing this patient by the request  of:  Pincus Sanes, MD   CC: Low back pain f/u  [id number]  Rebecca [name] is a 83 y.o. female coming in with complaint of low back pain.  Surgery in 1991.   Patient was seen previously and was having some lumbar radiculopathy. Patient was having signs and symptoms that is consistent with more of a spinal stenosis. Patient was sent for x-rays. X-rays that show that significant osteophytic changes with bilateral pars defect on L4 that was causing some spondylolisthesis.  Patient was making little improvement started then HHPT, and continued all other conservative therapy  Patient states unable to afford PT.  Patient does be much more compliant with the medications and the over-the-counter medications. Feels like she is making progress. Has not felt this good in quite some time. Patient feels that her lidocaine patches have been helping her and if she had a brace for her back she would even do better.    l  Past Medical History:  Diagnosis Date  . Anemia, unspecified   . Ankle pain, left   . CAD (coronary artery disease)   . Esophageal reflux   . Fibrocystic breast disease   . Hiatal hernia   . Hypercholesteremia   . Hypertension   . IDDM (insulin dependent diabetes mellitus) (HCC)    Type II  . Multinodular goiter   . Sickle-cell trait [facility])    Past Surgical History:  Procedure Laterality Date  . "FEMALE"  1976  . BACK SURGERY  1990  . BIOPSY THYROID     Dr.Ellison-Neg   . CORONARY ANGIOPLASTY    . FOOT SURGERY  2004  . RETINAL LASER PROCEDURE     Social History   Social History  . Marital status: Widowed    Spouse name: N/A  . Number of children: N/A  . Years of education: N/A   Social History Main Topics  . Smoking status: Never Smoker  . Smokeless tobacco: Never Used  . Alcohol use No  . Drug  use: No  . Sexual activity: Not Asked   Other Topics Concern  . None   Social History Narrative   Widowed   Gets regular exercise   Allergies  Allergen Reactions  . Codeine     "Makes me drunk"   Family History  Problem Relation Age of Onset  . Hypertension Unknown   . Lung cancer Unknown   . Heart disease Unknown   . Gout Sister   . Gout Brother   . Heart attack Father   . Heart attack Mother   . Gout Brother     Past medical history, social, surgical and family history all reviewed in electronic medical record.  No pertanent information unless stated regarding to the chief complaint.   Review of Systems: No headache, visual changes, nausea, vomiting, diarrhea, constipation, dizziness, abdominal pain, skin rash, fevers, chills, night sweats, weight loss, swollen lymph nodes, , chest pain, shortness of breath, mood changes.  Positive muscle aches, body aches  Objective  Blood pressure 130/60, pulse (!) 58, height  (1.6 m), weight 210 lb (95.3 kg), SpO2 96 %.   Systems examined below as of 12/24/16 General: NAD A&O x3 mood, affect normal  HEENT: Pupils equal, extraocular movements intact no nystagmus Respiratory: not short of breath at rest or with speaking Cardiovascular: No lower  extremity edema, non tender Skin: Warm dry intact with no signs of infection or rash on extremities or on axial skeleton. Abdomen: Soft nontender, no masses Neuro: Cranial nerves  intact, neurovascularly intact in all extremities with 2+ DTRs and 2+ pulses. Lymph: No lymphadenopathy appreciated today  Gait antalgic with a walker MSK: Non tender with full range of motion and good stability and symmetric strength and tone of shoulders, elbows, wrist,  knee hips and ankles bilaterally.     Back Exam:  Inspection: increase kyphosis of the upper back with degenerative scoliosis of the lower back. Motion: Flexion 25 deg, Extension 20 deg, Side Bending to 25 deg bilaterally,  Rotation to 25  deg bilaterally  SLR laying: Negative  XSLR laying: Negative  Palpable tenderness: Continued tenderness in the paraspinal musculature of the lumbar spine stable from previous exam. FABER: unable to do due ot tightness. . Sensory change: Gross sensation intact to all lumbar and sacral dermatomes.  Reflexes: 2+ at both patellar tendons, 2+ at achilles tendons, Babinski's downgoing.  Strength at foot  4/5 and symmetric note change from previous exam with strength    Impression and Recommendations:     This case required medical decision making of moderate complexity.      Note: This dictation was prepared with Dragon dictation along with smaller phrase technology. Any transcriptional errors that result from this process are unintentional.

## 2016-12-24 NOTE — Patient Instructions (Addendum)
Good to see you  You are making progress.  Ice is your friend.  Continue the meds and the vitamins Stay active and only wear the back brace with a lot of activity  See me again in 3-4 months

## 2017-02-04 ENCOUNTER — Ambulatory Visit (INDEPENDENT_AMBULATORY_CARE_PROVIDER_SITE_OTHER): Payer: PPO | Admitting: *Deleted

## 2017-02-04 DIAGNOSIS — Z23 Encounter for immunization: Secondary | ICD-10-CM | POA: Diagnosis not present

## 2017-02-06 ENCOUNTER — Other Ambulatory Visit: Payer: Self-pay | Admitting: Internal Medicine

## 2017-02-13 ENCOUNTER — Other Ambulatory Visit: Payer: Self-pay | Admitting: Internal Medicine

## 2017-04-04 ENCOUNTER — Telehealth: Payer: Self-pay | Admitting: Internal Medicine

## 2017-04-04 MED ORDER — NOVOLIN 70/30 RELION (70-30) 100 UNIT/ML ~~LOC~~ SUSP: SUBCUTANEOUS | 1 refills | Status: DC

## 2017-04-04 MED ORDER — INSULIN ASPART 100 UNIT/ML ~~LOC~~ SOLN: SUBCUTANEOUS | 5 refills | Status: DC

## 2017-04-04 MED ORDER — INSULIN GLARGINE 100 UNIT/ML SOLOSTAR PEN: 8.0000 [IU] | PEN_INJECTOR | Freq: Every day | SUBCUTANEOUS | 5 refills | Status: DC

## 2017-04-04 NOTE — Telephone Encounter (Signed)
Additional CRM: Percival SpanishKennedy, Cheryl W 04/04/2017 10:57 AM   Pt call to say that the pharmacy has faxed over what insulin her insurance will cover . Pt said she need this med today       [facility] [address]

## 2017-04-04 NOTE — Telephone Encounter (Signed)
Two prescriptions were sent to her pharmacy - please call her tomorrow to review.    The lantus is long acting - it is taken once a day.  The novolog is a short acting insulin taken before her two meals.    She must monitor her sugar closely for the first several days - have her call us with her sugars so we can adjust her medication

## 2017-04-04 NOTE — Telephone Encounter (Signed)
Spoke with pharmacy, insurance will cover Novolog, lantus, or Fiasp. Please send alternative to pharmacy per pts request.

## 2017-04-04 NOTE — Telephone Encounter (Signed)
Copied from CRM [phone]. Topic: General - Other >> Apr 04, 2017  9:52 AM Lelon FrohlichGolden, Laurita Peron, RMA wrote: Reason for CRM: pt stated that she can not have her insulin filled until Monday but that she will run out before then and she would like to know if there are any samples in the office  Please call pt [phone]

## 2017-04-04 NOTE — Telephone Encounter (Signed)
If her insurance is currently covering her insulin we can change the dosing for  7 units in the evening to make sure she has enough.  If we change the dose now she should be able to pick it up at the pharmacy right away.

## 2017-04-04 NOTE — Telephone Encounter (Signed)
Advised pt that we do not carry her insulin as a sample. Spoke with pharmacy and the pharmacy states the that RX should be lasting the pt 50 days and it is not. Pt states that she takes 15 units in the AM and 5 in the PM, when her sugars are high she takes 7 units in the evening. Advised pt that she needs to discuss changes in her meds with Dr Lawerance BachBurns before doing them herself. Pt wants to know if she can change her RX to something the insurance will cover. Advised pt to [employer] insurance company and find out what they will cover and let us know.

## 2017-04-05 ENCOUNTER — Telehealth: Payer: Self-pay | Admitting: Internal Medicine

## 2017-04-05 DIAGNOSIS — R69 Illness, unspecified: Secondary | ICD-10-CM | POA: Diagnosis not present

## 2017-04-05 MED ORDER — ONETOUCH ULTRASOFT LANCETS MISC: 1.0000 | Freq: Two times a day (BID) | 5 refills | Status: DC

## 2017-04-05 MED ORDER — INSULIN NPH ISOPHANE & REGULAR (70-30) 100 UNIT/ML ~~LOC~~ SUSP: SUBCUTANEOUS | 1 refills | Status: DC

## 2017-04-05 MED ORDER — GLUCOSE BLOOD VI STRP: 1.0000 | ORAL_STRIP | Freq: Two times a day (BID) | 5 refills | Status: DC

## 2017-04-05 NOTE — Telephone Encounter (Signed)
Copied from CRM [phone]. Topic: Inquiry >> Apr 05, 2017  2:20 PM Windy KalataMichael, Taylor L, NT wrote: Reason for CRM: patient would like for Dr. Lawerance BachBurns to call her at the end of the day when she is finished at the end of the day. She states she can not afford the [place] it is $144 and she would like to talk with Dr. Lawerance BachBurns about some different suggestions.

## 2017-04-05 NOTE — Telephone Encounter (Signed)
Spoke with pt, pt picked up Novolin RX. Pharmacy did not have Novolog in stock and Lantus was too expensive. Advised pt we would continue Novolin and disregard all changes.

## 2017-04-05 NOTE — Telephone Encounter (Addendum)
Called patient -- She bought novolin 70/30 and it cost her $ 24 she will take it until she sees me. She will talk with her insurance appt.

## 2017-04-05 NOTE — Telephone Encounter (Signed)
Spoke with pt to advise on changes. Pt understands and refills were sent in for testing supplies. Pt is going to call back next week with her readings

## 2017-04-08 ENCOUNTER — Encounter (INDEPENDENT_AMBULATORY_CARE_PROVIDER_SITE_OTHER): Payer: Medicare HMO | Admitting: Ophthalmology

## 2017-04-08 DIAGNOSIS — E113593 Type 2 diabetes mellitus with proliferative diabetic retinopathy without macular edema, bilateral: Secondary | ICD-10-CM | POA: Diagnosis not present

## 2017-04-08 DIAGNOSIS — I10 Essential (primary) hypertension: Secondary | ICD-10-CM | POA: Diagnosis not present

## 2017-04-08 DIAGNOSIS — E11319 Type 2 diabetes mellitus with unspecified diabetic retinopathy without macular edema: Secondary | ICD-10-CM

## 2017-04-08 DIAGNOSIS — H35033 Hypertensive retinopathy, bilateral: Secondary | ICD-10-CM

## 2017-04-08 DIAGNOSIS — H43813 Vitreous degeneration, bilateral: Secondary | ICD-10-CM

## 2017-04-17 ENCOUNTER — Other Ambulatory Visit: Payer: Self-pay | Admitting: Internal Medicine

## 2017-04-17 ENCOUNTER — Other Ambulatory Visit: Payer: Self-pay | Admitting: Family Medicine

## 2017-04-22 DIAGNOSIS — H401132 Primary open-angle glaucoma, bilateral, moderate stage: Secondary | ICD-10-CM | POA: Diagnosis not present

## 2017-04-22 DIAGNOSIS — H40053 Ocular hypertension, bilateral: Secondary | ICD-10-CM | POA: Diagnosis not present

## 2017-04-25 NOTE — Progress Notes (Signed)
Tawana ScaleZach Jaida Rebecca [name] D.O.  Sports Medicine [address] [place], [place] [address] Phone: [phone] Subjective:    I'm seeing this patient by the request  of:    CC: Back pain follow-up  ZHY:QMVHQIONGEHPI:Subjective  Rebecca [name] Babic is a 84 y.o. female coming in with complaint of back pain.  Found to have severe arthritis with degenerative disc disease of the back.  There was a concern with patient having spinal stenosis.   Patient states that is was not as bad as it was but it still is bothering her. Patient referred to physical therapy but was unable to afford to go.     Bar spine were independently visualized by me today showing a bilateral pars defect at L4 severe degenerative disc disease and osteoarthritic changes.  Past medical history is significant for surgery in 1991.  Past Medical History:  Diagnosis Date  . Anemia, unspecified   . Ankle pain, left   . CAD (coronary artery disease)   . Esophageal reflux   . Fibrocystic breast disease   . Hiatal hernia   . Hypercholesteremia   . Hypertension   . IDDM (insulin dependent diabetes mellitus) (HCC)    Type II  . Multinodular goiter   . Sickle-cell trait [facility](HCC)    Past Surgical History:  Procedure Laterality Date  . "FEMALE"  1976  . BACK SURGERY  1990  . BIOPSY THYROID     Dr.Ellison-Neg   . CORONARY ANGIOPLASTY    . FOOT SURGERY  2004  . RETINAL LASER PROCEDURE     Social History   Socioeconomic History  . Marital status: Widowed    Spouse name: None  . Number of children: None  . Years of education: None  . Highest education level: None  Social Needs  . Financial resource strain: None  . Food insecurity - worry: None  . Food insecurity - inability: None  . Transportation needs - medical: None  . Transportation needs - non-medical: None  Occupational History  . None  Tobacco Use  . Smoking status: Never Smoker  . Smokeless tobacco: Never Used  Substance and Sexual Activity  . Alcohol use: No  . Drug use:  No  . Sexual activity: None  Other Topics Concern  . None  Social History Narrative   Widowed   Gets regular exercise   Allergies  Allergen Reactions  . Codeine     "Makes me drunk"   Family History  Problem Relation Age of Onset  . Hypertension Unknown   . Lung cancer Unknown   . Heart disease Unknown   . Gout Sister   . Gout Brother   . Heart attack Father   . Heart attack Mother   . Gout Brother      Past medical history, social, surgical and family history all reviewed in electronic medical record.  No pertanent information unless stated regarding to the chief complaint.   Review of Systems:Review of systems updated and as accurate as of 04/26/17  No headache, visual changes, nausea, vomiting, diarrhea, constipation, dizziness, abdominal pain, skin rash, fevers, chills, night sweats, weight loss, swollen lymph nodes, chest pain, shortness of breath, mood changes.  Positive body aches, joint swelling, muscle aches  Objective  Blood pressure 128/62, pulse (!) 57, height 5\' 3"  (1.6 m), weight 204 lb (92.5 kg), SpO2 98 %. Systems examined below as of 04/26/17   General: No apparent distress alert and oriented x3 mood and affect normal, dressed appropriately.  HEENT: Pupils equal,  extraocular movements intact  Respiratory: Patient's speak in full sentences and does not appear short of breath  Cardiovascular: No lower extremity edema, non tender, no erythema  Skin: Warm dry intact with no signs of infection or rash on extremities or on axial skeleton.  Abdomen: Soft nontender  Neuro: Cranial nerves II through XII are intact, neurovascularly intact in all extremities with 2+ DTRs and 2+ pulses.  Lymph: No lymphadenopathy of posterior or anterior cervical chain or axillae bilaterally.  Gait antalgic with the aid of a walker MSK:  tender with near full range of motion and good stability and symmetric strength and tone of shoulders, elbows, wrist, hip, knee and ankles  bilaterally.  Does have arthritic changes of multiple joints. Back Exam:  Inspection: Degenerative scoliosis noted Motion: Flexion 25 deg, Extension 25 deg, Side Bending to 35 deg bilaterally,  Rotation to 35 deg bilaterally  SLR laying: Negative significant tightness of the hamstrings bilaterally XSLR laying: Negative  Palpable tenderness: Tender to palpation in the paraspinal musculature lumbar spine right greater than left. FABER: Positive bilateral. Sensory change: Gross sensation intact to all lumbar and sacral dermatomes.  Reflexes: 2+ at both patellar tendons, 2+ at achilles tendons, Babinski's downgoing.  Strength at foot  Difficult to assess with patient being a little noncompliant on testing.   Impression and Recommendations:         Note: This dictation was prepared with Dragon dictation along with smaller phrase technology. Any transcriptional errors that result from this process are unintentional.

## 2017-04-26 ENCOUNTER — Ambulatory Visit (INDEPENDENT_AMBULATORY_CARE_PROVIDER_SITE_OTHER): Payer: Medicare HMO | Admitting: Family Medicine

## 2017-04-26 ENCOUNTER — Encounter: Payer: Self-pay | Admitting: Family Medicine

## 2017-04-26 DIAGNOSIS — M5136 Other intervertebral disc degeneration, lumbar region: Secondary | ICD-10-CM

## 2017-04-26 NOTE — Patient Instructions (Addendum)
Good to see you  Ice 20 minutes 2 times daily. Usually after activity and before bed. Exercises 3 times a week.  pennsaid pinkie amount topically 2 times daily as needed.  Vitamin D 2000 IU daily  Tart cherry extract any dose at night See me again in 4-6 months!

## 2017-04-26 NOTE — Assessment & Plan Note (Addendum)
Likely spinal stenosis.  Encourage patient to take the gabapentin regularly.  We will continue with conservative therapy and see me again in 3 months

## 2017-04-29 ENCOUNTER — Telehealth: Payer: Self-pay | Admitting: Emergency Medicine

## 2017-04-30 ENCOUNTER — Other Ambulatory Visit: Payer: Self-pay | Admitting: Emergency Medicine

## 2017-04-30 MED ORDER — INSULIN NPH ISOPHANE & REGULAR (70-30) 100 UNIT/ML ~~LOC~~ SUSP: SUBCUTANEOUS | 11 refills | Status: DC

## 2017-04-30 NOTE — Telephone Encounter (Signed)
Spoke with pt, insurance will not cover Novolin Relion, which is [facility] brand. They will only cover Novolin. New rx sent to pharmacy. Pt is aware.

## 2017-05-01 NOTE — Telephone Encounter (Signed)
error 

## 2017-05-05 NOTE — Patient Instructions (Addendum)

## 2017-05-05 NOTE — Progress Notes (Signed)
Subjective:    Patient ID: Rebecca [name], female    DOB: Jan 14, 1933, 84 y.o.   MRN: [id number]  HPI The patient is here for follow up.  Diabetes: She is taking her medication daily as prescribed. She is compliant with a diabetic diet. She is not exercising regularly.  She checks her feet daily and denies foot lesions. She is up-to-date with an ophthalmology examination.   CAD, Hypertension: She follows with cardiology.  She is taking her medication daily. She is compliant with a low sodium diet.  She denies chest pain, palpitations, edema, shortness of breath and regular headaches. She is not exercising regularly.  She does not monitor her blood pressure at home.    Hyperlipidemia: She is taking her medication daily. She is compliant with a low fat/cholesterol diet. She is not exercising regularly. She denies myalgias.   GERD:  She is taking her medication daily as prescribed.  She denies any GERD symptoms and feels her GERD is well controlled.   She has chronic joint pain and foot pain.  She walks with a walker but has to bend over to use it.   She is trying to get a taller Rolator for she will lean over when she uses it.    Insomnia:  She takes 1/2 of an ativan only as needed.  The medication works and she denies side effects.  Medications and allergies reviewed with patient and updated if appropriate.  Patient Active Problem List   Diagnosis Date Noted  . Insomnia 10/29/2016  . Degenerative disc disease, lumbar 08/01/2016  . Glaucoma 07/23/2016  . Numbness 04/23/2016  . Chronic pain of right knee 12/28/2015  . Lower back pain 10/24/2015  . Pain in both feet 10/24/2015  . Abnormality of gait 04/01/2013  . Posterior tibial tendon dysfunction 04/01/2013  . Diabetes ([place]) 04/01/2013  . Ankle pain, left 10/20/2010  . UNSPECIFIED VITAMIN D DEFICIENCY 11/08/2008  . EDEMA- LOCALIZED 11/08/2008  . Sickle-cell trait ([place]) 01/01/2008  . Esophageal reflux 09/02/2007  . GOITER,  MULTINODULAR 11/26/2006  . Coronary atherosclerosis 11/26/2006  . HIATAL HERNIA 11/26/2006  . FIBROCYSTIC BREAST DISEASE 07/31/2006  . HYPERCHOLESTEROLEMIA 05/27/2006  . Essential hypertension 05/27/2006    Current Outpatient Medications on File Prior to Visit  Medication Sig Dispense Refill  . amLODipine (NORVASC) 10 MG tablet TAKE ONE TABLET BY MOUTH ONCE DAILY 90 tablet 2  . Ascorbic Acid (VITAMIN C) 500 MG tablet Take 500 mg by mouth 2 (two) times daily.      Marland Kitchen aspirin 81 MG tablet Take 81 mg by mouth daily.      Marland Kitchen atorvastatin (LIPITOR) 10 MG tablet TAKE 1 TABLET BY MOUTH ONCE DAILY 90 tablet 1  . Blood Glucose Monitoring Suppl (ONE TOUCH ULTRA 2) w/Device KIT Use to check blood sugars twice a day Dx e11.9 1 each 0  . calcium carbonate (OS-CAL) 600 MG TABS Take 600 mg by mouth daily.     . Cholecalciferol (VITAMIN D3) 5000 UNITS CAPS Take 5,000 Units by mouth daily.      Marland Kitchen Cod Liver Oil 1000 MG CAPS Take 1,000 mg by mouth daily. During the winter    . glucose blood (ONE TOUCH ULTRA TEST) test strip 1 each by Other route 2 (two) times daily. Use to check blood sugars twice a day Dx E11.9 100 each 5  . hydrochlorothiazide (MICROZIDE) 12.5 MG capsule Take 1 capsule (12.5 mg total) by mouth daily as needed. 90 capsule 1  .  Isopropyl Alcohol (PHARMACIST CHOICE ALCOHOL) 70 % MISC Use with Glucose meter to check sugars daily DX E11.42 100 each 1  . isosorbide mononitrate (IMDUR) 30 MG 24 hr tablet TAKE ONE TABLET BY MOUTH ONCE DAILY 90 tablet 3  . Lancets (ONETOUCH ULTRASOFT) lancets 1 each by Other route 2 (two) times daily. Use to help check blood sugars twice a day Dx E11.9 100 each 5  . latanoprost (XALATAN) 0.005 % ophthalmic solution 1 drop at bedtime.    . lidocaine (LIDODERM) 5 % APPLY 1 PATCH TOPICALLY ONCE DAILY(REMOVE AND DISCARD PATCH WITHIN 12 HOURS OR AS DIRECTED BY MD) 30 patch 1  . meclizine (ANTIVERT) 25 MG tablet Take 1/2 to 1 tab three times a day as needed 15 tablet 0  .  Multiple Vitamin (MULTIVITAMIN) tablet Take 1 tablet by mouth daily.      . naproxen (NAPROSYN) 500 MG tablet Take 1 tablet (500 mg total) by mouth 2 (two) times daily with a meal. 60 tablet 3  . nitroGLYCERIN (NITROSTAT) 0.4 MG SL tablet DISSOLVE ONE TABLET UNDER TONGUE AS DIRECTED FOR CHEST PAIN 25 tablet 5  . omeprazole (PRILOSEC) 20 MG capsule TAKE ONE CAPSULE BY MOUTH IN THE MORNING 90 capsule 3  . quinapril (ACCUPRIL) 40 MG tablet TAKE ONE TABLET BY MOUTH ONCE DAILY 90 tablet 3  . dorzolamide-timolol (COSOPT) 22.3-6.8 MG/ML ophthalmic solution      No current facility-administered medications on file prior to visit.     Past Medical History:  Diagnosis Date  . Anemia, unspecified   . Ankle pain, left   . CAD (coronary artery disease)   . Esophageal reflux   . Fibrocystic breast disease   . Hiatal hernia   . Hypercholesteremia   . Hypertension   . IDDM (insulin dependent diabetes mellitus) ([place])    Type II  . Multinodular goiter   . Sickle-cell trait [facility])     Past Surgical History:  Procedure Laterality Date  . "FEMALE"  1976  . BACK SURGERY  1990  . BIOPSY THYROID     Dr.Ellison-Neg   . CORONARY ANGIOPLASTY    . FOOT SURGERY  2004  . RETINAL LASER PROCEDURE      Social History   Socioeconomic History  . Marital status: Widowed    Spouse name: None  . Number of children: None  . Years of education: None  . Highest education level: None  Social Needs  . Financial resource strain: None  . Food insecurity - worry: None  . Food insecurity - inability: None  . Transportation needs - medical: None  . Transportation needs - non-medical: None  Occupational History  . None  Tobacco Use  . Smoking status: Never Smoker  . Smokeless tobacco: Never Used  Substance and Sexual Activity  . Alcohol use: No  . Drug use: No  . Sexual activity: None  Other Topics Concern  . None  Social History Narrative   Widowed   Gets regular exercise    Family History    Problem Relation Age of Onset  . Hypertension Unknown   . Lung cancer Unknown   . Heart disease Unknown   . Gout Sister   . Gout Brother   . Heart attack Father   . Heart attack Mother   . Gout Brother     Review of Systems  Constitutional: Negative for chills and fever.  Respiratory: Negative for cough, shortness of breath and wheezing.   Cardiovascular: Positive for leg swelling (left  ankle if prolonged standing). Negative for chest pain and palpitations.  Gastrointestinal:       GERD controlled  Neurological: Negative for light-headedness and headaches.       Objective:   Vitals:   05/06/17 0930  BP: 132/66  Pulse: (!) 57  Resp: 16  Temp: 97.8 F (36.6 C)  SpO2: 96%   Wt Readings from Last 3 Encounters:  05/06/17 206 lb (93.4 kg)  04/26/17 204 lb (92.5 kg)  12/24/16 210 lb (95.3 kg)   Body mass index is 36.49 kg/m.   Physical Exam    Constitutional: Appears well-developed and well-nourished. No distress.  HENT:  Head: Normocephalic and atraumatic.  Neck: Neck supple. No tracheal deviation present. No thyromegaly present.  No cervical lymphadenopathy Cardiovascular: Normal rate, regular rhythm and normal heart sounds.   2/6 systolic murmur heard. No carotid bruit .  No edema Pulmonary/Chest: Effort normal and breath sounds normal. No respiratory distress. No has no wheezes. No rales.  Skin: Skin is warm and dry. Not diaphoretic.  Psychiatric: Normal mood and affect. Behavior is normal.      Assessment & Plan:    See Problem List for Assessment and Plan of chronic medical problems.

## 2017-05-06 ENCOUNTER — Encounter: Payer: Self-pay | Admitting: Internal Medicine

## 2017-05-06 ENCOUNTER — Other Ambulatory Visit (INDEPENDENT_AMBULATORY_CARE_PROVIDER_SITE_OTHER): Payer: Medicare HMO

## 2017-05-06 ENCOUNTER — Ambulatory Visit (INDEPENDENT_AMBULATORY_CARE_PROVIDER_SITE_OTHER): Payer: Medicare HMO | Admitting: Internal Medicine

## 2017-05-06 VITALS — BP 132/66 | HR 57 | Temp 97.8°F | Resp 16 | Wt 206.0 lb

## 2017-05-06 DIAGNOSIS — G4700 Insomnia, unspecified: Secondary | ICD-10-CM | POA: Diagnosis not present

## 2017-05-06 DIAGNOSIS — I2510 Atherosclerotic heart disease of native coronary artery without angina pectoris: Secondary | ICD-10-CM

## 2017-05-06 DIAGNOSIS — E7800 Pure hypercholesterolemia, unspecified: Secondary | ICD-10-CM

## 2017-05-06 DIAGNOSIS — Z794 Long term (current) use of insulin: Secondary | ICD-10-CM | POA: Diagnosis not present

## 2017-05-06 DIAGNOSIS — K219 Gastro-esophageal reflux disease without esophagitis: Secondary | ICD-10-CM

## 2017-05-06 DIAGNOSIS — Z13820 Encounter for screening for osteoporosis: Secondary | ICD-10-CM | POA: Diagnosis not present

## 2017-05-06 DIAGNOSIS — D573 Sickle-cell trait: Secondary | ICD-10-CM | POA: Diagnosis not present

## 2017-05-06 DIAGNOSIS — E1142 Type 2 diabetes mellitus with diabetic polyneuropathy: Secondary | ICD-10-CM

## 2017-05-06 DIAGNOSIS — I10 Essential (primary) hypertension: Secondary | ICD-10-CM

## 2017-05-06 LAB — LIPID PANEL
Cholesterol: 183 mg/dL (ref 0–200)
HDL: 75.8 mg/dL (ref >39.00)
LDL Cholesterol: 93 mg/dL (ref 0–99)
NonHDL: 106.98
Total CHOL/HDL Ratio: 2
Triglycerides: 68 mg/dL (ref 0.0–149.0)
VLDL: 13.6 mg/dL (ref 0.0–40.0)

## 2017-05-06 LAB — CBC WITH DIFFERENTIAL/PLATELET
Basophils Absolute: 0.2 10*3/uL — ABNORMAL HIGH (ref 0.0–0.1)
Basophils Relative: 3.2 % — ABNORMAL HIGH (ref 0.0–3.0)
Eosinophils Absolute: 0.3 10*3/uL (ref 0.0–0.7)
Eosinophils Relative: 5.5 % — ABNORMAL HIGH (ref 0.0–5.0)
HCT: 35.8 % — ABNORMAL LOW (ref 36.0–46.0)
Hemoglobin: 11.9 g/dL — ABNORMAL LOW (ref 12.0–15.0)
Lymphocytes Relative: 32.9 % (ref 12.0–46.0)
Lymphs Abs: 1.8 10*3/uL (ref 0.7–4.0)
MCHC: 33.2 g/dL (ref 30.0–36.0)
MCV: 80.3 fl (ref 78.0–100.0)
Monocytes Absolute: 0.8 10*3/uL (ref 0.1–1.0)
Monocytes Relative: 15 % — ABNORMAL HIGH (ref 3.0–12.0)
Neutro Abs: 2.4 10*3/uL (ref 1.4–7.7)
Neutrophils Relative %: 43.4 % (ref 43.0–77.0)
Platelets: 228 10*3/uL (ref 150.0–400.0)
RBC: 4.47 Mil/uL (ref 3.87–5.11)
RDW: 16.7 % — ABNORMAL HIGH (ref 11.5–15.5)
WBC: 5.6 10*3/uL (ref 4.0–10.5)

## 2017-05-06 LAB — COMPREHENSIVE METABOLIC PANEL
ALT: 18 U/L (ref 0–35)
AST: 23 U/L (ref 0–37)
Albumin: 3.7 g/dL (ref 3.5–5.2)
Alkaline Phosphatase: 48 U/L (ref 39–117)
BUN: 39 mg/dL — ABNORMAL HIGH (ref 6–23)
CO2: 29 mEq/L (ref 19–32)
Calcium: 10 mg/dL (ref 8.4–10.5)
Chloride: 103 mEq/L (ref 96–112)
Creatinine, Ser: 1.06 mg/dL (ref 0.40–1.20)
GFR: 63.69 mL/min (ref >60.00)
Glucose, Bld: 136 mg/dL — ABNORMAL HIGH (ref 70–99)
Potassium: 4.9 mEq/L (ref 3.5–5.1)
Sodium: 141 mEq/L (ref 135–145)
Total Bilirubin: 0.4 mg/dL (ref 0.2–1.2)
Total Protein: 6.6 g/dL (ref 6.0–8.3)

## 2017-05-06 LAB — HEMOGLOBIN A1C: Hgb A1c MFr Bld: 7.7 % — ABNORMAL HIGH (ref 4.6–6.5)

## 2017-05-06 MED ORDER — INSULIN NPH ISOPHANE & REGULAR (70-30) 100 UNIT/ML ~~LOC~~ SUSP: SUBCUTANEOUS | 11 refills | Status: DC

## 2017-05-06 NOTE — Assessment & Plan Note (Signed)
Check lipid panel  Continue daily statin -  10 mg daily Regular exercise and healthy diet encouraged

## 2017-05-06 NOTE — Assessment & Plan Note (Signed)
Last cbc normal Asymptomatic cbc

## 2017-05-06 NOTE — Assessment & Plan Note (Signed)
GERD controlled Continue daily medication  

## 2017-05-06 NOTE — Assessment & Plan Note (Signed)
Check a1c Diabetic diet, unable to exercise due to arthritis Adjusted insulin dose to what she takes - she does have difficulty affording insulin - $24 / mo She will let me know if there are any problems with her current insulin rx F/u in 6 months

## 2017-05-06 NOTE — Assessment & Plan Note (Signed)
Takes 1/2 ativan prn for sleep No side effects Only takes if needed Will continue - try to avoid taking if possible

## 2017-05-06 NOTE — Assessment & Plan Note (Signed)
No chest pain, palpitations, SOB Continue current medications Cbc, cmp

## 2017-05-06 NOTE — Assessment & Plan Note (Signed)
BP well controlled Current regimen effective and well tolerated Continue current medications at current doses cmp  

## 2017-05-09 ENCOUNTER — Encounter: Payer: Self-pay | Admitting: Emergency Medicine

## 2017-05-13 DIAGNOSIS — R69 Illness, unspecified: Secondary | ICD-10-CM | POA: Diagnosis not present

## 2017-05-16 ENCOUNTER — Telehealth: Payer: Self-pay | Admitting: Emergency Medicine

## 2017-05-16 MED ORDER — INSULIN NPH ISOPHANE & REGULAR (70-30) 100 UNIT/ML ~~LOC~~ SUSP: SUBCUTANEOUS | 11 refills | Status: DC

## 2017-05-16 NOTE — Telephone Encounter (Signed)
Spoke with pt to inform, pt understood changes

## 2017-05-16 NOTE — Telephone Encounter (Signed)
Much better, a little lower in the morning than needed - decrease to 10 units at night and continue 15 in morning.  Med list updated

## 2017-05-16 NOTE — Telephone Encounter (Signed)
Copied from CRM [phone]. Topic: Quick Communication - Other Results >> May 16, 2017 10:26 AM Crist InfanteHarrald, Kathy J wrote:  Pt NOW states she was to call you with 7 days of Blood sugar readings.  3/1   (am) 103 3/2   (am)  99  (pm before supper) 109 3/3 (am)   77 3/4 (am) 81 3/5  (am) 80  (before supper)  96 3/6 (am) 80  (after eating supper) 223  (after insulin later that nite) 145 3/7 (am) 88 Pt states she has increased her insulin to 12 units after supper ,  but staying with 15 units in the morning.

## 2017-05-23 DIAGNOSIS — R69 Illness, unspecified: Secondary | ICD-10-CM | POA: Diagnosis not present

## 2017-05-27 DIAGNOSIS — E785 Hyperlipidemia, unspecified: Secondary | ICD-10-CM | POA: Diagnosis not present

## 2017-05-27 DIAGNOSIS — Z794 Long term (current) use of insulin: Secondary | ICD-10-CM | POA: Diagnosis not present

## 2017-05-27 DIAGNOSIS — E1159 Type 2 diabetes mellitus with other circulatory complications: Secondary | ICD-10-CM | POA: Diagnosis not present

## 2017-05-27 DIAGNOSIS — H547 Unspecified visual loss: Secondary | ICD-10-CM | POA: Diagnosis not present

## 2017-05-27 DIAGNOSIS — E1140 Type 2 diabetes mellitus with diabetic neuropathy, unspecified: Secondary | ICD-10-CM | POA: Diagnosis not present

## 2017-05-27 DIAGNOSIS — H04129 Dry eye syndrome of unspecified lacrimal gland: Secondary | ICD-10-CM | POA: Diagnosis not present

## 2017-05-27 DIAGNOSIS — G8929 Other chronic pain: Secondary | ICD-10-CM | POA: Diagnosis not present

## 2017-05-27 DIAGNOSIS — H409 Unspecified glaucoma: Secondary | ICD-10-CM | POA: Diagnosis not present

## 2017-05-27 DIAGNOSIS — E669 Obesity, unspecified: Secondary | ICD-10-CM | POA: Diagnosis not present

## 2017-05-27 DIAGNOSIS — I25119 Atherosclerotic heart disease of native coronary artery with unspecified angina pectoris: Secondary | ICD-10-CM | POA: Diagnosis not present

## 2017-06-03 DIAGNOSIS — H401132 Primary open-angle glaucoma, bilateral, moderate stage: Secondary | ICD-10-CM | POA: Diagnosis not present

## 2017-06-03 DIAGNOSIS — H1013 Acute atopic conjunctivitis, bilateral: Secondary | ICD-10-CM | POA: Diagnosis not present

## 2017-06-03 DIAGNOSIS — H40053 Ocular hypertension, bilateral: Secondary | ICD-10-CM | POA: Diagnosis not present

## 2017-06-03 DIAGNOSIS — H04123 Dry eye syndrome of bilateral lacrimal glands: Secondary | ICD-10-CM | POA: Diagnosis not present

## 2017-06-06 ENCOUNTER — Other Ambulatory Visit: Payer: Medicare HMO

## 2017-06-06 NOTE — Progress Notes (Signed)
Rebecca [name] D.O. [facility] [address] [place], [place] [address] Phone: [phone] Subjective:    I'm seeing this patient by the request  of:    CC: Back pain.  OZH:YQMVHQIONGHPI:Subjective  Rebecca [name] Havey is a 84 y.o. female coming in with complaint of back pain.  Severe degenerative disc disease noted.  These were independently visualized on x-ray.  Patient has been fairly noncompliant with the home exercises as well as the physical therapy.  Patient though with the over-the-counter medications has been improving slightly.  Still walking with the walker in a flexed position seems to give her more difficulty.  Sometimes feels like it does radiate to her legs.  Not stopping her from any activities.      Past Medical History:  Diagnosis Date  . Anemia, unspecified   . Ankle pain, left   . CAD (coronary artery disease)   . Esophageal reflux   . Fibrocystic breast disease   . Hiatal hernia   . Hypercholesteremia   . Hypertension   . IDDM (insulin dependent diabetes mellitus) (HCC)    Type II  . Multinodular goiter   . Sickle-cell trait [facility](HCC)    Past Surgical History:  Procedure Laterality Date  . "FEMALE"  1976  . BACK SURGERY  1990  . BIOPSY THYROID     Dr.Ellison-Neg   . CORONARY ANGIOPLASTY    . FOOT SURGERY  2004  . RETINAL LASER PROCEDURE     Social History   Socioeconomic History  . Marital status: Widowed    Spouse name: Not on file  . Number of children: Not on file  . Years of education: Not on file  . Highest education level: Not on file  Occupational History  . Not on file  Social Needs  . Financial resource strain: Not on file  . Food insecurity:    Worry: Not on file    Inability: Not on file  . Transportation needs:    Medical: Not on file    Non-medical: Not on file  Tobacco Use  . Smoking status: Never Smoker  . Smokeless tobacco: Never Used  Substance and Sexual Activity  . Alcohol use: No  . Drug use: No  . Sexual activity: Not  on file  Lifestyle  . Physical activity:    Days per week: Not on file    Minutes per session: Not on file  . Stress: Not on file  Relationships  . Social connections:    Talks on phone: Not on file    Gets together: Not on file    Attends religious service: Not on file    Active member of club or organization: Not on file    Attends meetings of clubs or organizations: Not on file    Relationship status: Not on file  Other Topics Concern  . Not on file  Social History Narrative   Widowed   Gets regular exercise   Allergies  Allergen Reactions  . Codeine     "Makes me drunk"   Family History  Problem Relation Age of Onset  . Hypertension Unknown   . Lung cancer Unknown   . Heart disease Unknown   . Gout Sister   . Gout Brother   . Heart attack Father   . Heart attack Mother   . Gout Brother      Past medical history, social, surgical and family history all reviewed in electronic medical record.  No pertanent information unless stated regarding  to the chief complaint.   Review of Systems:Review of systems updated and as accurate as of 06/07/17  No headache, visual changes, nausea, vomiting, diarrhea, constipation, dizziness, abdominal pain, skin rash, fevers, chills, night sweats, weight loss, swollen lymph nodes, body aches, joint swelling, muscle aches, chest pain, shortness of breath, mood changes.   Objective  Blood pressure (!) 156/70, pulse (!) 59, height 5\' 3"  (1.6 m), weight 200 lb (90.7 kg), SpO2 96 %. Systems examined below as of 06/07/17   General: No apparent distress alert and oriented x3 mood and affect normal, dressed appropriately.  HEENT: Pupils equal, extraocular movements intact  Respiratory: Patient's speak in full sentences and does not appear short of breath  Cardiovascular: Trace lower extremity edema, non tender, no erythema  Skin: Warm dry intact with no signs of infection or rash on extremities or on axial skeleton.  Abdomen: Soft nontender    Neuro: Cranial nerves II through XII are intact, neurovascularly intact in all extremities with 2+ DTRs and 2+ pulses.  Lymph: No lymphadenopathy of posterior or anterior cervical chain or axillae bilaterally.  Gait antalgic and walking with the aid of a rolling walker MSK: Mild tender with limited range of motion and good stability and symmetric strength and tone of shoulders, elbows, wrist, hip, knee arthritic changes of multiple joints Back exam shows the patient does have degenerative scoliosis of the lumbar spine.  Minimal range of motion in all planes secondary to pain.  Patient has negative straight leg test though.  Unable to do Logan secondary to stiffness of the hips bilaterally.  Neurovascularly intact distally.  Trace effusion noted.  Deep tendon reflexes seem to be intact     Impression and Recommendations:     This case required medical decision making of moderate complexity.      Note: This dictation was prepared with Dragon dictation along with smaller phrase technology. Any transcriptional errors that result from this process are unintentional.

## 2017-06-07 ENCOUNTER — Ambulatory Visit: Payer: Medicare HMO | Admitting: Internal Medicine

## 2017-06-07 ENCOUNTER — Encounter: Payer: Self-pay | Admitting: Family Medicine

## 2017-06-07 ENCOUNTER — Ambulatory Visit: Payer: Medicare HMO | Admitting: Family Medicine

## 2017-06-07 DIAGNOSIS — M5136 Other intervertebral disc degeneration, lumbar region: Secondary | ICD-10-CM

## 2017-06-07 NOTE — Assessment & Plan Note (Signed)
Severe in nature.  We discussed worsening symptoms and to take different medications.  Patient has been fairly noncompliant with the medications previously.  We discussed icing regimen and home exercises.  Patient will continue with conservative therapy and see me again 2-3 months

## 2017-06-07 NOTE — Patient Instructions (Addendum)
Good to see you  Rebecca [name] is your friend.  Choline 500mg  daily can help with the mind Vitamin D at least 2000IU daily with the calcium  You should do great  Call us when you need the pennsaid  See me again when you need me

## 2017-06-17 ENCOUNTER — Telehealth: Payer: Self-pay | Admitting: Internal Medicine

## 2017-06-17 MED ORDER — INSULIN NPH ISOPHANE & REGULAR (70-30) 100 UNIT/ML ~~LOC~~ SUSP: SUBCUTANEOUS | 2 refills | Status: DC

## 2017-06-17 NOTE — Telephone Encounter (Signed)
Copied from CRM [phone]. Topic: Quick Communication - See Telephone Encounter >> Jun 17, 2017  9:58 AM Windy KalataMichael, Kayvan Hoefling L, NT wrote: CRM for notification. See Telephone encounter for: 06/17/17.  Patient is requesting a refill on insulin NPH-regular Human (NOVOLIN 70/30) (70-30) 100 UNIT/ML injection. She states she takes 15 units in the morning and 10 every night. She states she runs out tomorrow. Please advise.  [facility] [address] - [place] ([address]), [place] - [address] [place] [address] Phone: [phone] Fax: [phone]

## 2017-06-17 NOTE — Telephone Encounter (Signed)
Novolin 70/30 100 unit/ml injection refill request. She is injecting 15 units in the morning and 10 units at night.   She runs out tomorrow (4 /9/19).   Please advise.   Thanks.  LOV 05/06/17 with Dr. Lawerance BachBurns.  [facility] [place]  [address]

## 2017-06-19 NOTE — Telephone Encounter (Signed)
Pt calling about her insulin NPH-regular Human (NOVOLIN 70/30) (70-30) 100 UNIT/ML injection  [facility] [address] does not have this insulin in stock and she is needing this refilled. She wants to see if [facility] [address] may have this medication. She is unsure of the phone number and wanted to see if the office can find someone that has this in stock. She is out of the medication.

## 2017-06-20 NOTE — Telephone Encounter (Signed)
Called pt to verify msg below. Pt states the pharmacy [facility] called late yesterday afternoon they received some in so she pick insulin up last night.Rebecca [name].Raechel Chute/lmb

## 2017-06-27 ENCOUNTER — Other Ambulatory Visit: Payer: Self-pay | Admitting: Family Medicine

## 2017-06-27 ENCOUNTER — Other Ambulatory Visit: Payer: Self-pay | Admitting: Internal Medicine

## 2017-06-29 DIAGNOSIS — R69 Illness, unspecified: Secondary | ICD-10-CM | POA: Diagnosis not present

## 2017-07-11 DIAGNOSIS — R69 Illness, unspecified: Secondary | ICD-10-CM | POA: Diagnosis not present

## 2017-07-29 ENCOUNTER — Ambulatory Visit: Payer: PPO

## 2017-08-08 ENCOUNTER — Other Ambulatory Visit: Payer: Self-pay | Admitting: Internal Medicine

## 2017-08-11 ENCOUNTER — Other Ambulatory Visit: Payer: Self-pay | Admitting: Internal Medicine

## 2017-08-13 DIAGNOSIS — R69 Illness, unspecified: Secondary | ICD-10-CM | POA: Diagnosis not present

## 2017-08-14 DIAGNOSIS — R69 Illness, unspecified: Secondary | ICD-10-CM | POA: Diagnosis not present

## 2017-10-01 ENCOUNTER — Encounter (INDEPENDENT_AMBULATORY_CARE_PROVIDER_SITE_OTHER): Payer: Medicare HMO | Admitting: Ophthalmology

## 2017-10-03 DIAGNOSIS — R69 Illness, unspecified: Secondary | ICD-10-CM | POA: Diagnosis not present

## 2017-10-14 ENCOUNTER — Ambulatory Visit: Payer: Medicare HMO

## 2017-10-14 ENCOUNTER — Encounter (INDEPENDENT_AMBULATORY_CARE_PROVIDER_SITE_OTHER): Payer: Medicare HMO | Admitting: Ophthalmology

## 2017-10-23 ENCOUNTER — Other Ambulatory Visit: Payer: Self-pay | Admitting: Internal Medicine

## 2017-11-02 NOTE — Progress Notes (Signed)
Subjective:    Patient ID: Rebecca [name], female    DOB: December 27, 1932, 84 y.o.   MRN: [id number]  HPI The patient is here for follow up.  Diabetes: She is taking her medication daily as prescribed. She is compliant with a diabetic diet. She is active, but not exercising regularly. She monitors her sugars and this morning was 99. She checks her feet daily and denies foot lesions.  She does have a deformity in her left foot the results and chronic pain and difficulty walking.  She is in need of diabetic shoes and needs a prescription for them today.  She is up-to-date with an ophthalmology examination.   CAD, Hypertension: She is taking her medication daily. She is compliant with a low sodium diet.  She denies chest pain, palpitations, shortness of breath and regular headaches. She is active, but not exercising regularly.  She does not monitor her blood pressure at home.    Hyperlipidemia: She is taking her medication daily. She is compliant with a low fat/cholesterol diet. She is not exercising regularly. She denies myalgias.   GERD:  She is taking her medication daily as prescribed.  She denies any GERD symptoms and feels her GERD is well controlled.   Insomnia;  She no longer ativan prn. Most nights she sleeps ok.    Medications and allergies reviewed with patient and updated if appropriate.  Patient Active Problem List   Diagnosis Date Noted  . Insomnia 10/29/2016  . Degenerative disc disease, lumbar 08/01/2016  . Glaucoma 07/23/2016  . Numbness 04/23/2016  . Chronic pain of right knee 12/28/2015  . Lower back pain 10/24/2015  . Pain in both feet 10/24/2015  . Abnormality of gait 04/01/2013  . Posterior tibial tendon dysfunction 04/01/2013  . Diabetes ([place]) 04/01/2013  . Ankle pain, left 10/20/2010  . UNSPECIFIED VITAMIN D DEFICIENCY 11/08/2008  . EDEMA- LOCALIZED 11/08/2008  . Sickle-cell trait ([place]) 01/01/2008  . Esophageal reflux 09/02/2007  . GOITER, MULTINODULAR  11/26/2006  . Coronary atherosclerosis 11/26/2006  . HIATAL HERNIA 11/26/2006  . FIBROCYSTIC BREAST DISEASE 07/31/2006  . HYPERCHOLESTEROLEMIA 05/27/2006  . Essential hypertension 05/27/2006    Current Outpatient Medications on File Prior to Visit  Medication Sig Dispense Refill  . amLODipine (NORVASC) 10 MG tablet TAKE 1 TABLET BY MOUTH ONCE DAILY 90 tablet 2  . Ascorbic Acid (VITAMIN C) 500 MG tablet Take 500 mg by mouth 2 (two) times daily.      Marland Kitchen aspirin 81 MG tablet Take 81 mg by mouth daily.      Marland Kitchen atorvastatin (LIPITOR) 10 MG tablet TAKE 1 TABLET BY MOUTH ONCE DAILY 90 tablet 0  . Blood Glucose Monitoring Suppl (ONE TOUCH ULTRA 2) w/Device KIT Use to check blood sugars twice a day Dx e11.9 1 each 0  . calcium carbonate (OS-CAL) 600 MG TABS Take 600 mg by mouth daily.     Marland Kitchen Cod Liver Oil 1000 MG CAPS Take 1,000 mg by mouth daily. During the winter    . glucose blood (ONE TOUCH ULTRA TEST) test strip 1 each by Other route 2 (two) times daily. Use to check blood sugars twice a day Dx E11.9 100 each 5  . hydrochlorothiazide (MICROZIDE) 12.5 MG capsule Take 1 capsule (12.5 mg total) by mouth daily as needed. 90 capsule 1  . insulin NPH-regular Human (NOVOLIN 70/30) (70-30) 100 UNIT/ML injection Inject 15 units in the AM and 10 units in the PM 10 mL 2  . Isopropyl Alcohol (  PHARMACIST CHOICE ALCOHOL) 70 % MISC Use with Glucose meter to check sugars daily DX E11.42 100 each 1  . isosorbide mononitrate (IMDUR) 30 MG 24 hr tablet TAKE 1 TABLET BY MOUTH ONCE DAILY 90 tablet 3  . Lancets (ONETOUCH ULTRASOFT) lancets 1 each by Other route 2 (two) times daily. Use to help check blood sugars twice a day Dx E11.9 100 each 5  . latanoprost (XALATAN) 0.005 % ophthalmic solution 1 drop at bedtime.    . lidocaine (LIDODERM) 5 % APPLY 1 PATCH TOPICALLY ONCE DAILY(REMOVE AND DISCARD PATCH WITHIN 12 HOURS OR AS DIRECTED BY MD) 30 patch 1  . meclizine (ANTIVERT) 25 MG tablet Take 1/2 to 1 tab three times a  day as needed 15 tablet 0  . Multiple Vitamin (MULTIVITAMIN) tablet Take 1 tablet by mouth daily.      . naproxen (NAPROSYN) 500 MG tablet TAKE 1 TABLET BY MOUTH TWICE DAILY WITH A MEAL 180 tablet 1  . nitroGLYCERIN (NITROSTAT) 0.4 MG SL tablet DISSOLVE ONE TABLET UNDER TONGUE AS DIRECTED FOR CHEST PAIN 25 tablet 5  . omeprazole (PRILOSEC) 20 MG capsule TAKE 1 CAPSULE BY MOUTH IN THE MORNING 90 capsule 3  . quinapril (ACCUPRIL) 40 MG tablet TAKE 1 TABLET BY MOUTH ONCE DAILY 90 tablet 1   No current facility-administered medications on file prior to visit.     Past Medical History:  Diagnosis Date  . Anemia, unspecified   . Ankle pain, left   . CAD (coronary artery disease)   . Esophageal reflux   . Fibrocystic breast disease   . Hiatal hernia   . Hypercholesteremia   . Hypertension   . IDDM (insulin dependent diabetes mellitus) ([place])    Type II  . Multinodular goiter   . Sickle-cell trait [facility])     Past Surgical History:  Procedure Laterality Date  . "FEMALE"  1976  . BACK SURGERY  1990  . BIOPSY THYROID     Dr.Ellison-Neg   . CORONARY ANGIOPLASTY    . FOOT SURGERY  2004  . RETINAL LASER PROCEDURE      Social History   Socioeconomic History  . Marital status: Widowed    Spouse name: Not on file  . Number of children: Not on file  . Years of education: Not on file  . Highest education level: Not on file  Occupational History  . Not on file  Social Needs  . Financial resource strain: Not on file  . Food insecurity:    Worry: Not on file    Inability: Not on file  . Transportation needs:    Medical: Not on file    Non-medical: Not on file  Tobacco Use  . Smoking status: Never Smoker  . Smokeless tobacco: Never Used  Substance and Sexual Activity  . Alcohol use: No  . Drug use: No  . Sexual activity: Not on file  Lifestyle  . Physical activity:    Days per week: Not on file    Minutes per session: Not on file  . Stress: Not on file  Relationships  .  Social connections:    Talks on phone: Not on file    Gets together: Not on file    Attends religious service: Not on file    Active member of club or organization: Not on file    Attends meetings of clubs or organizations: Not on file    Relationship status: Not on file  Other Topics Concern  . Not on  file  Social History Narrative   Widowed   Gets regular exercise    Family History  Problem Relation Age of Onset  . Hypertension Unknown   . Lung cancer Unknown   . Heart disease Unknown   . Gout Sister   . Gout Brother   . Heart attack Father   . Heart attack Mother   . Gout Brother     Review of Systems  Constitutional: Negative for chills and fever.  Respiratory: Negative for cough, shortness of breath and wheezing.   Cardiovascular: Positive for leg swelling (chronic). Negative for chest pain and palpitations.  Gastrointestinal:       Jerrye Bushy controlled  Neurological: Negative for light-headedness and headaches.       Objective:   Vitals:   11/04/17 0925  BP: 134/64  Pulse: (!) 51  Resp: 18  Temp: 98.1 F (36.7 C)  SpO2: 98%   BP Readings from Last 3 Encounters:  11/04/17 134/64  06/07/17 (!) 156/70  05/06/17 132/66   Wt Readings from Last 3 Encounters:  11/04/17 200 lb (90.7 kg)  06/07/17 200 lb (90.7 kg)  05/06/17 206 lb (93.4 kg)   Body mass index is 35.43 kg/m.   Physical Exam    Constitutional: Appears well-developed and well-nourished. No distress.  HENT:  Head: Normocephalic and atraumatic.  Neck: Neck supple. No tracheal deviation present. No thyromegaly present.  No cervical lymphadenopathy Cardiovascular: Normal rate, regular rhythm and normal heart sounds.   No murmur heard. No carotid bruit .  Mild, non pitting b/l LE edema Pulmonary/Chest: Effort normal and breath sounds normal. No respiratory distress. No has no wheezes. No rales.  Skin: Skin is warm and dry. Not diaphoretic.  Psychiatric: Normal mood and affect. Behavior is normal.     Diabetic Foot Exam - Simple   Simple Foot Form Diabetic Foot exam was performed with the following findings:  Yes   Visual Inspection See comments:  Yes Sensation Testing Intact to touch and monofilament testing bilaterally:  Yes Pulse Check Posterior Tibialis and Dorsalis pulse intact bilaterally:  Yes Comments Left foot - Severe pes planus, osteoid deformity left medial posterior arch and tibial tendon dysfunction Right foot - bunion, mild pes planus Mild hammer toes b/l       Assessment & Plan:    See Problem List for Assessment and Plan of chronic medical problems.

## 2017-11-02 NOTE — Patient Instructions (Addendum)
  Test(s) ordered today. Your results will be released to MyChart (or called to you) after review, usually within 72hours after test completion. If any changes need to be made, you will be notified at that same time.  Medications reviewed and updated.  No changes recommended at this time.    Please followup in 6 months   

## 2017-11-04 ENCOUNTER — Other Ambulatory Visit: Payer: Self-pay | Admitting: Internal Medicine

## 2017-11-04 ENCOUNTER — Other Ambulatory Visit (INDEPENDENT_AMBULATORY_CARE_PROVIDER_SITE_OTHER): Payer: Medicare HMO

## 2017-11-04 ENCOUNTER — Ambulatory Visit (INDEPENDENT_AMBULATORY_CARE_PROVIDER_SITE_OTHER): Payer: Medicare HMO | Admitting: Internal Medicine

## 2017-11-04 ENCOUNTER — Encounter: Payer: Self-pay | Admitting: Internal Medicine

## 2017-11-04 VITALS — BP 134/64 | HR 51 | Temp 98.1°F | Resp 18 | Ht 63.0 in | Wt 200.0 lb

## 2017-11-04 DIAGNOSIS — I10 Essential (primary) hypertension: Secondary | ICD-10-CM

## 2017-11-04 DIAGNOSIS — K219 Gastro-esophageal reflux disease without esophagitis: Secondary | ICD-10-CM | POA: Diagnosis not present

## 2017-11-04 DIAGNOSIS — R69 Illness, unspecified: Secondary | ICD-10-CM | POA: Diagnosis not present

## 2017-11-04 DIAGNOSIS — Z794 Long term (current) use of insulin: Secondary | ICD-10-CM

## 2017-11-04 DIAGNOSIS — G4700 Insomnia, unspecified: Secondary | ICD-10-CM

## 2017-11-04 DIAGNOSIS — M21962 Unspecified acquired deformity of left lower leg: Secondary | ICD-10-CM | POA: Diagnosis not present

## 2017-11-04 DIAGNOSIS — I2510 Atherosclerotic heart disease of native coronary artery without angina pectoris: Secondary | ICD-10-CM

## 2017-11-04 DIAGNOSIS — E1142 Type 2 diabetes mellitus with diabetic polyneuropathy: Secondary | ICD-10-CM

## 2017-11-04 DIAGNOSIS — E7800 Pure hypercholesterolemia, unspecified: Secondary | ICD-10-CM

## 2017-11-04 LAB — COMPREHENSIVE METABOLIC PANEL
ALT: 11 U/L (ref 0–35)
AST: 14 U/L (ref 0–37)
Albumin: 3.8 g/dL (ref 3.5–5.2)
Alkaline Phosphatase: 58 U/L (ref 39–117)
BUN: 31 mg/dL — ABNORMAL HIGH (ref 6–23)
CO2: 29 mEq/L (ref 19–32)
Calcium: 9.9 mg/dL (ref 8.4–10.5)
Chloride: 107 mEq/L (ref 96–112)
Creatinine, Ser: 1.22 mg/dL — ABNORMAL HIGH (ref 0.40–1.20)
GFR: 54.08 mL/min — ABNORMAL LOW (ref >60.00)
Glucose, Bld: 113 mg/dL — ABNORMAL HIGH (ref 70–99)
Potassium: 4.5 mEq/L (ref 3.5–5.1)
Sodium: 142 mEq/L (ref 135–145)
Total Bilirubin: 0.4 mg/dL (ref 0.2–1.2)
Total Protein: 7.1 g/dL (ref 6.0–8.3)

## 2017-11-04 LAB — CBC WITH DIFFERENTIAL/PLATELET
Basophils Absolute: 0.1 10*3/uL (ref 0.0–0.1)
Basophils Relative: 2 % (ref 0.0–3.0)
Eosinophils Absolute: 0.3 10*3/uL (ref 0.0–0.7)
Eosinophils Relative: 5.6 % — ABNORMAL HIGH (ref 0.0–5.0)
HCT: 36.5 % (ref 36.0–46.0)
Hemoglobin: 11.9 g/dL — ABNORMAL LOW (ref 12.0–15.0)
Lymphocytes Relative: 31.4 % (ref 12.0–46.0)
Lymphs Abs: 1.6 10*3/uL (ref 0.7–4.0)
MCHC: 32.6 g/dL (ref 30.0–36.0)
MCV: 80.9 fl (ref 78.0–100.0)
Monocytes Absolute: 0.7 10*3/uL (ref 0.1–1.0)
Monocytes Relative: 13.2 % — ABNORMAL HIGH (ref 3.0–12.0)
Neutro Abs: 2.5 10*3/uL (ref 1.4–7.7)
Neutrophils Relative %: 47.8 % (ref 43.0–77.0)
Platelets: 225 10*3/uL (ref 150.0–400.0)
RBC: 4.51 Mil/uL (ref 3.87–5.11)
RDW: 16.3 % — ABNORMAL HIGH (ref 11.5–15.5)
WBC: 5.2 10*3/uL (ref 4.0–10.5)

## 2017-11-04 LAB — HEMOGLOBIN A1C: Hgb A1c MFr Bld: 7.4 % — ABNORMAL HIGH (ref 4.6–6.5)

## 2017-11-04 NOTE — Assessment & Plan Note (Signed)
GERD controlled Continue daily medication  

## 2017-11-04 NOTE — Assessment & Plan Note (Signed)
No concerning symptoms Following with cardiology CBC, CMP Continue current medications

## 2017-11-04 NOTE — Assessment & Plan Note (Signed)
Check A1c We will adjust medication dose if needed

## 2017-11-04 NOTE — Assessment & Plan Note (Addendum)
Severe pes planus, osteoid deformity left medial posterior arch and tibial tendon dysfunction - sees Dr Victorino DikeHewitt Deformity left foot creating chronic pain and difficulty walking Needs diabetic shoes-prescription given Needs left ankle brace, possibly right ankle brace to assist in gait.  - will prescribe

## 2017-11-04 NOTE — Assessment & Plan Note (Signed)
Lipid panel well controlled 6 months ago so we will not recheck CMP Continue statin

## 2017-11-04 NOTE — Assessment & Plan Note (Signed)
BP well controlled Current regimen effective and well tolerated Continue current medications at current doses cmp  

## 2017-11-18 ENCOUNTER — Ambulatory Visit (INDEPENDENT_AMBULATORY_CARE_PROVIDER_SITE_OTHER): Payer: Medicare HMO | Admitting: *Deleted

## 2017-11-18 ENCOUNTER — Other Ambulatory Visit: Payer: Self-pay | Admitting: *Deleted

## 2017-11-18 VITALS — BP 118/58 | HR 52 | Resp 17 | Ht 63.0 in | Wt 199.0 lb

## 2017-11-18 DIAGNOSIS — Z0000 Encounter for general adult medical examination without abnormal findings: Secondary | ICD-10-CM | POA: Diagnosis not present

## 2017-11-18 DIAGNOSIS — Z9181 History of falling: Secondary | ICD-10-CM

## 2017-11-18 NOTE — Patient Outreach (Signed)
[facility]) Care Management  11/18/2017  Rebecca [name] [birth year] [id number]   Telephone Screen  Referral Date: 11/18/17 Referral Source: from [facility] Referral Reason: diabetes education, community resources. Patient unable to afford NPH insulin and other medications. She cannot afford food and has many financial strains.   Diagnoses of Diabetes   Insurance: [employer] attempt # 1 successful Patient is able to verify HIPAA Reviewed and addressed referral to [facility] with patient  Social: Rebecca [name] is a widow living alone with only one son as a support system He works 2 jobs at this time. She reports she is independent with most of her care using a walker. She does drive her car only short distances (MD appointments, errands and church). Does exercise provided by her MD to remain strong Hx of HHPT for broken bilateral ankles Use to go [facility] [employer] cause she can not do much walking  Bone density-discussed   Conditions: Arthritis, HTN, CAD, hiatal, hernia, DM, GERD, multinodular goiter, lumbar degenerative disc disease, hypercholesterolemia, sickle cell trait, fibrocystic breast disease, acquired left foot deformity, glaucoma, pain in her back, knees, feet and shoulders  Medications: Patient unable to afford NPH insulin and other medications per referral. Had to buy insulin all this year, 2019 out of pocket. Her son purchased it last month, August because she could not afford to pay $24.88/month that started in January 2019. In 2018 she was paying $3 for this same insulin when she had ? Health team advantage. Dr Lawerance Bach writes insulin Rx  Also there is a cost concern with lidocaine patch she uses for pain in her lower back related past surgery plus knees, feet and shoulder. Dr Lawerance Bach also writes this rx  Previously saw a pain specialist at a local pain center but found the cost of visits and medications prescribed were too expensive  CM discussed  SHIIP She reports she is suppose to be getting "extra support" for medicines but was informed the extra support would not cover her insulin and pain patch Prefer to get flu shots in the month of November    Falls x 2 in last few months she states is related to her left deformed foot and she has not received her DM shoes, (inserts and shoes) at this time, no injury obtained but foot sore  CM discussed having her to inquire about extra support for her ankles when she goes to get fitted for DM shoes  Meals on wheels is being received via [facility] services already  Appointments: She drove in to see her wellness RN at MD office today, 11/18/17 and discussed ordering a tub bench and quad cane She was seen last week by Dr Lawerance Bach once she got her  Transportation/car fixed [facility] appointment is in November to get her flu shot  Need new dentures Has had present ones for 30 years and she is having issues with them fitting properly at [facility] time. She also need eyeglasses but states she is on a fixed budget and not able to afford either  Advance Directives: she has a living will and POA (no interest in changing) but interested in [facility] and MOST forms. Would like a copy of each to review and someone to be available to answer questions Encouraged her to provide a copy of advance directives to her MD and hospital   Consent: [facility] RN CM reviewed [facility] services with patient. Patient gave verbal consent for services for [facility] pharmacy, [facility] and [facility]  Community RN CM for DM education, falls x 2, further DME needs. She states she prefers calls from [facility] staff at this time and will discuss potential home visits prn but no MD visits  Plan: [facility] RN CM will refer Rebecca [name] to [facility] (community resources, financial resources, copies of MH directives and MOST, referral states she cannot afford food and has many financial strains.), [facility] pharmacy (medication assistance with  NPH insulin and lidocaine patch) and [facility] Nurse, children's CM (falls  x 2, lives alone, DME needs, DM education)  RN CM sent a successful outreach letter as discussed with SHIIP information to use if she does not receive resolution to her pharmacy and coverage needs by active enrollment time  Rebecca L. Noelle Penner, RN, BSN, CCM [facility] Telephonic Care Management Care Coordinator Office number [phone] Mobile number [phone]  Main [facility] number [phone] Fax number [phone]

## 2017-11-18 NOTE — Progress Notes (Addendum)
Subjective:   Rebecca [name] is a 84 y.o. female who presents for Medicare Annual (Subsequent) preventive examination.  Review of Systems:  No ROS.  Medicare Wellness Visit. Additional risk factors are reflected in the social history.  Cardiac Risk Factors include: advanced age (>49mn, >>70women);diabetes mellitus;dyslipidemia;hypertension;obesity (BMI >30kg/m2) Sleep patterns: feels rested on waking, gets up 1-2 times nightly to void and sleeps 7-8 hours nightly.    Home Safety/Smoke Alarms: Feels safe in home. Smoke alarms in place.  Living environment; residence and Firearm Safety: 1-story house/ trailer, no firearms. Lives alone, states she needs a wide base prong cane and tub chair DME, good support system Seat Belt Safety/Bike Helmet: Wears seat belt.     Objective:     Vitals: BP (!) 118/58   Pulse (!) 52   Resp 17   Ht '5\' 3"'  (1.6 m)   Wt 199 lb (90.3 kg)   SpO2 97%   BMI 35.25 kg/m   Body mass index is 35.25 kg/m.  Advanced Directives 11/18/2017 07/23/2016 12/29/2015 12/28/2015  Does Patient Have a Medical Advance Directive? Yes Yes Yes Yes  Type of [place]amed[place]of [place] will [place] will [place] will [place] will  Copy of HEdomin Chart? No - copy requested No - copy requested No - copy requested No - copy requested    Tobacco Social History   Tobacco Use  Smoking Status Never Smoker  Smokeless Tobacco Never Used     Counseling given: Not Answered  Past Medical History:  Diagnosis Date  . Anemia, unspecified   . Ankle pain, left   . CAD (coronary artery disease)   . Esophageal reflux   . Fibrocystic breast disease   . Hiatal hernia   . Hypercholesteremia   . Hypertension   . IDDM (insulin dependent diabetes mellitus) ([place]    Type II  . Multinodular goiter   . Sickle-cell trait ([facility]    Past Surgical  History:  Procedure Laterality Date  . "FEMALE"  1976  . BACK SURGERY  1990  . BIOPSY THYROID     Dr.Ellison-Neg   . CORONARY ANGIOPLASTY    . FOOT SURGERY  2004  . RETINAL LASER PROCEDURE     Family History  Problem Relation Age of Onset  . Hypertension Unknown   . Lung cancer Unknown   . Heart disease Unknown   . Gout Sister   . Gout Brother   . Heart attack Father   . Heart attack Mother   . Gout Brother    Social History   Socioeconomic History  . Marital status: Widowed    Spouse name: Not on file  . Number of children: 1  . Years of education: Not on file  . Highest education level: Not on file  Occupational History  . Not on file  Social Needs  . Financial resource strain: Hard  . Food insecurity:    Worry: Often true    Inability: Often true  . Transportation needs:    Medical: Yes    Non-medical: Yes  Tobacco Use  . Smoking status: Never Smoker  . Smokeless tobacco: Never Used  Substance and Sexual Activity  . Alcohol use: No  . Drug use: No  . Sexual activity: Never  Lifestyle  . Physical activity:    Days per week: 0 days    Minutes per session: 0 min  . Stress: Only a  little  Relationships  . Social connections:    Talks on phone: More than three times a week    Gets together: More than three times a week    Attends religious service: More than 4 times per year    Active member of club or organization: Yes    Attends meetings of clubs or organizations: More than 4 times per year    Relationship status: Widowed  Other Topics Concern  . Not on file  Social History Narrative   Widowed   Gets regular exercise    Outpatient Encounter Medications as of 11/18/2017  Medication Sig  . amLODipine (NORVASC) 10 MG tablet TAKE 1 TABLET BY MOUTH ONCE DAILY  . Ascorbic Acid (VITAMIN C) 500 MG tablet Take 500 mg by mouth 2 (two) times daily.    Marland Kitchen aspirin 81 MG tablet Take 81 mg by mouth daily.    Marland Kitchen atorvastatin (LIPITOR) 10 MG tablet TAKE 1 TABLET BY  MOUTH ONCE DAILY  . Blood Glucose Monitoring Suppl (ONE TOUCH ULTRA 2) w/Device KIT Use to check blood sugars twice a day Dx e11.9  . calcium carbonate (OS-CAL) 600 MG TABS Take 600 mg by mouth daily.   Marland Kitchen Cod Liver Oil 1000 MG CAPS Take 1,000 mg by mouth daily. During the winter  . glucose blood (ONE TOUCH ULTRA TEST) test strip 1 each by Other route 2 (two) times daily. Use to check blood sugars twice a day Dx E11.9  . hydrochlorothiazide (MICROZIDE) 12.5 MG capsule Take 1 capsule (12.5 mg total) by mouth daily as needed.  . insulin NPH-regular Human (NOVOLIN 70/30) (70-30) 100 UNIT/ML injection Inject 15 units in the AM and 10 units in the PM  . Isopropyl Alcohol (PHARMACIST CHOICE ALCOHOL) 70 % MISC Use with Glucose meter to check sugars daily DX E11.42  . isosorbide mononitrate (IMDUR) 30 MG 24 hr tablet TAKE 1 TABLET BY MOUTH ONCE DAILY  . latanoprost (XALATAN) 0.005 % ophthalmic solution 1 drop at bedtime.  . lidocaine (LIDODERM) 5 % APPLY 1 PATCH TOPICALLY ONCE DAILY(REMOVE AND DISCARD PATCH WITHIN 12 HOURS OR AS DIRECTED BY MD)  . meclizine (ANTIVERT) 25 MG tablet Take 1/2 to 1 tab three times a day as needed  . Multiple Vitamin (MULTIVITAMIN) tablet Take 1 tablet by mouth daily.    . naproxen (NAPROSYN) 500 MG tablet TAKE 1 TABLET BY MOUTH TWICE DAILY WITH A MEAL  . nitroGLYCERIN (NITROSTAT) 0.4 MG SL tablet DISSOLVE ONE TABLET UNDER TONGUE AS DIRECTED FOR CHEST PAIN  . omeprazole (PRILOSEC) 20 MG capsule TAKE 1 CAPSULE BY MOUTH IN THE MORNING  . ONETOUCH DELICA LANCETS 10F MISC USE 1  TO CHECK GLUCOSE TWICE DAILY  . quinapril (ACCUPRIL) 40 MG tablet TAKE 1 TABLET BY MOUTH ONCE DAILY   No facility-administered encounter medications on file as of 11/18/2017.     Activities of Daily Living In your present state of health, do you have any difficulty performing the following activities: 11/18/2017  Hearing? N  Vision? N  Difficulty concentrating or making decisions? N  Walking or climbing  stairs? N  Dressing or bathing? Y  Doing errands, shopping? Y  Preparing Food and eating ? Y  Using the Toilet? N  In the past six months, have you accidently leaked urine? N  Do you have problems with loss of bowel control? N  Managing your Medications? N  Managing your Finances? N  Housekeeping or managing your Housekeeping? Y  Some recent data might be hidden  Patient Care Team: Binnie Rail, MD as PCP - General (Internal Medicine)    Assessment:   This is a routine wellness examination for [place]. Physical assessment deferred to PCP.   Exercise Activities and Dietary recommendations Current Exercise Habits: Home exercise routine(chair exercises provided), Type of exercise: walking;stretching, Time (Minutes): 30, Frequency (Times/Week): 6, Weekly Exercise (Minutes/Week): 180, Intensity: Mild, Exercise limited by: orthopedic condition(s)  Diet (meal preparation, eat out, water intake, caffeinated beverages, dairy products, fruits and vegetables): in general, a "healthy" diet   Difficult for patient to eat consistently due to finances. Resources for food banks given.   Reviewed heart healthy and diabetic diet. Encouraged patient to increase daily water and healthy fluid intake. Glucerna and ensure samples and coupons provided.      Goals    . Maintain current health status     Continue to eat healthy, be as active as possible, and be independent.    . Patient Stated     I want to stay as active as possible by continuing with stretching and chair exercises. Continue to enjoy church and family.        Fall Risk Fall Risk  11/18/2017 11/04/2017 07/23/2016 12/28/2015 10/24/2015  Falls in the past year? Yes Yes No No No  Number falls in past yr: 2 or more 2 or more - - -  Injury with Fall? No No - - -  Risk Factor Category  High Fall Risk High Fall Risk - - -  Risk for fall due to : Impaired mobility;Impaired balance/gait - - - -  Follow up Education provided;Falls  prevention discussed - - - -    Depression Screen PHQ 2/9 Scores 11/18/2017 07/23/2016 12/28/2015 10/24/2015  PHQ - 2 Score 1 0 0 0  PHQ- 9 Score - - 2 -     Cognitive Function MMSE - Mini Mental State Exam 11/18/2017 07/23/2016  Orientation to time 5 5  Orientation to Place 5 5  Registration 3 3  Attention/ Calculation 5 4  Recall 1 2  Language- name 2 objects 2 2  Language- repeat 1 1  Language- follow 3 step command 3 3  Language- read & follow direction 1 1  Write a sentence 1 1  Copy design 1 1  Total score 28 28        Immunization History  Administered Date(s) Administered  . Influenza Split 01/22/2011, 02/20/2012  . Influenza Whole 03/13/1999, 01/01/2008, 02/07/2009, 12/13/2009  . Influenza, High Dose Seasonal PF 02/07/2016, 02/04/2017  . Influenza,inj,Quad PF,6+ Mos 01/16/2013  . Pneumococcal Conjugate-13 07/14/2015  . Pneumococcal Polysaccharide-23 05/08/2006   Screening Tests Health Maintenance  Topic Date Due  . DEXA SCAN  06/21/1999  . OPHTHALMOLOGY EXAM  08/02/2017  . INFLUENZA VACCINE  10/10/2017  . TETANUS/TDAP  05/06/2018 (Originally 06/20/1951)  . HEMOGLOBIN A1C  05/07/2018  . FOOT EXAM  11/05/2018  . PNA vac Low Risk Adult  Completed      Plan:   [facility] referral for community resources of not being able to afford, medications, food, and for diabetes education.   DME orders placed for wide base prong can and tub chair to help maintain safety and maintain independence with ADLs.   An flu vaccination appointment with nurse was scheduled for 01/27/18.   Patient has an upcoming eye exam appointment, she will request them to send result report to PCP.   Continue doing brain stimulating activities (puzzles, reading, adult coloring books, staying active) to keep memory sharp.  Continue to eat heart healthy diet (full of fruits, vegetables, whole grains, lean protein, water--limit salt, fat, and sugar intake) and increase physical activity as  tolerated.  I have personally reviewed and noted the following in the patient's chart:   . Medical and social history . Use of alcohol, tobacco or illicit drugs  . Current medications and supplements . Functional ability and status . Nutritional status . Physical activity . Advanced directives . List of other physicians . Vitals . Screenings to include cognitive, depression, and falls . Referrals and appointments  In addition, I have reviewed and discussed with patient certain preventive protocols, quality metrics, and best practice recommendations. A written personalized care plan for preventive services as well as general preventive health recommendations were provided to patient.     Michiel Cowboy, RN  11/18/2017   Medical screening examination/treatment/procedure(s) were performed by non-physician practitioner and as supervising physician I was immediately available for consultation/collaboration. I agree with above. Binnie Rail, MD

## 2017-11-18 NOTE — Patient Instructions (Addendum)
Continue doing brain stimulating activities (puzzles, reading, adult coloring books, staying active) to keep memory sharp.   Continue to eat heart healthy diet (full of fruits, vegetables, whole grains, lean protein, water--limit salt, fat, and sugar intake) and increase physical activity as tolerated.   Rebecca [name] , Thank you for taking time to come for your Medicare Wellness Visit. I appreciate your ongoing commitment to your health goals. Please review the following plan we discussed and let me know if I can assist you in the future.   These are the goals we discussed: Goals    . Maintain current health status     Continue to eat healthy, be as active as possible, and be independent.    . Patient Stated     I want to stay as active as possible by continuing with stretching and chair exercises. Continue to enjoy church and family.        This is a list of the screening recommended for you and due dates:  Health Maintenance  Topic Date Due  . DEXA scan (bone density measurement)  06/21/1999  . Eye exam for diabetics  08/02/2017  . Flu Shot  10/10/2017  . Tetanus Vaccine  05/06/2018*  . Hemoglobin A1C  05/07/2018  . Complete foot exam   11/05/2018  . Pneumonia vaccines  Completed  *Topic was postponed. The date shown is not the original due date.    Health Maintenance, Female Adopting a healthy lifestyle and getting preventive care can go a long way to promote health and wellness. Talk with your health care provider about what schedule of regular examinations is right for you. This is a good chance for you to check in with your provider about disease prevention and staying healthy. In between checkups, there are plenty of things you can do on your own. Experts have done a lot of research about which lifestyle changes and preventive measures are most likely to keep you healthy. Ask your health care provider for more information. Weight and diet Eat a healthy diet  Be sure to  include plenty of vegetables, fruits, low-fat dairy products, and lean protein.  Do not eat a lot of foods high in solid fats, added sugars, or salt.  Get regular exercise. This is one of the most important things you can do for your health. ? Most adults should exercise for at least 150 minutes each week. The exercise should increase your heart rate and make you sweat (moderate-intensity exercise). ? Most adults should also do strengthening exercises at least twice a week. This is in addition to the moderate-intensity exercise.  Maintain a healthy weight  Body mass index (BMI) is a measurement that can be used to identify possible weight problems. It estimates body fat based on height and weight. Your health care provider can help determine your BMI and help you achieve or maintain a healthy weight.  For females 34 years of age and older: ? A BMI below 18.5 is considered underweight. ? A BMI of 18.5 to 24.9 is normal. ? A BMI of 25 to 29.9 is considered overweight. ? A BMI of 30 and above is considered obese.  Watch levels of cholesterol and blood lipids  You should start having your blood tested for lipids and cholesterol at 82 years of age, then have this test every 5 years.  You may need to have your cholesterol levels checked more often if: ? Your lipid or cholesterol levels are high. ? You are older than  82 years of age. ? You are at high risk for heart disease.  Cancer screening Lung Cancer  Lung cancer screening is recommended for adults 11-67 years old who are at high risk for lung cancer because of a history of smoking.  A yearly low-dose CT scan of the lungs is recommended for people who: ? Currently smoke. ? Have quit within the past 15 years. ? Have at least a 30-pack-year history of smoking. A pack year is smoking an average of one pack of cigarettes a day for 1 year.  Yearly screening should continue until it has been 15 years since you quit.  Yearly screening  should stop if you develop a health problem that would prevent you from having lung cancer treatment.  Breast Cancer  Practice breast self-awareness. This means understanding how your breasts normally appear and feel.  It also means doing regular breast self-exams. Let your health care provider know about any changes, no matter how small.  If you are in your 20s or 30s, you should have a clinical breast exam (CBE) by a health care provider every 1-3 years as part of a regular health exam.  If you are 51 or older, have a CBE every year. Also consider having a breast X-ray (mammogram) every year.  If you have a family history of breast cancer, talk to your health care provider about genetic screening.  If you are at high risk for breast cancer, talk to your health care provider about having an MRI and a mammogram every year.  Breast cancer gene (BRCA) assessment is recommended for women who have family members with BRCA-related cancers. BRCA-related cancers include: ? Breast. ? Ovarian. ? Tubal. ? Peritoneal cancers.  Results of the assessment will determine the need for genetic counseling and BRCA1 and BRCA2 testing.  Cervical Cancer Your health care provider may recommend that you be screened regularly for cancer of the pelvic organs (ovaries, uterus, and vagina). This screening involves a pelvic examination, including checking for microscopic changes to the surface of your cervix (Pap test). You may be encouraged to have this screening done every 3 years, beginning at age 52.  For women ages 22-65, health care providers may recommend pelvic exams and Pap testing every 3 years, or they may recommend the Pap and pelvic exam, combined with testing for human papilloma virus (HPV), every 5 years. Some types of HPV increase your risk of cervical cancer. Testing for HPV may also be done on women of any age with unclear Pap test results.  Other health care providers may not recommend any  screening for nonpregnant women who are considered low risk for pelvic cancer and who do not have symptoms. Ask your health care provider if a screening pelvic exam is right for you.  If you have had past treatment for cervical cancer or a condition that could lead to cancer, you need Pap tests and screening for cancer for at least 20 years after your treatment. If Pap tests have been discontinued, your risk factors (such as having a new sexual partner) need to be reassessed to determine if screening should resume. Some women have medical problems that increase the chance of getting cervical cancer. In these cases, your health care provider may recommend more frequent screening and Pap tests.  Colorectal Cancer  This type of cancer can be detected and often prevented.  Routine colorectal cancer screening usually begins at 82 years of age and continues through 82 years of age.  Your health  care provider may recommend screening at an earlier age if you have risk factors for colon cancer.  Your health care provider may also recommend using home test kits to check for hidden blood in the stool.  A small camera at the end of a tube can be used to examine your colon directly (sigmoidoscopy or colonoscopy). This is done to check for the earliest forms of colorectal cancer.  Routine screening usually begins at age 67.  Direct examination of the colon should be repeated every 5-10 years through 82 years of age. However, you may need to be screened more often if early forms of precancerous polyps or small growths are found.  Skin Cancer  Check your skin from head to toe regularly.  Tell your health care provider about any new moles or changes in moles, especially if there is a change in a mole's shape or color.  Also tell your health care provider if you have a mole that is larger than the size of a pencil eraser.  Always use sunscreen. Apply sunscreen liberally and repeatedly throughout the  day.  Protect yourself by wearing long sleeves, pants, a wide-brimmed hat, and sunglasses whenever you are outside.  Heart disease, diabetes, and high blood pressure  High blood pressure causes heart disease and increases the risk of stroke. High blood pressure is more likely to develop in: ? People who have blood pressure in the high end of the normal range (130-139/85-89 mm Hg). ? People who are overweight or obese. ? People who are African American.  If you are 70-74 years of age, have your blood pressure checked every 3-5 years. If you are 48 years of age or older, have your blood pressure checked every year. You should have your blood pressure measured twice-once when you are at a hospital or clinic, and once when you are not at a hospital or clinic. Record the average of the two measurements. To check your blood pressure when you are not at a hospital or clinic, you can use: ? An automated blood pressure machine at a pharmacy. ? A home blood pressure monitor.  If you are between 58 years and 33 years old, ask your health care provider if you should take aspirin to prevent strokes.  Have regular diabetes screenings. This involves taking a blood sample to check your fasting blood sugar level. ? If you are at a normal weight and have a low risk for diabetes, have this test once every three years after 82 years of age. ? If you are overweight and have a high risk for diabetes, consider being tested at a younger age or more often. Preventing infection Hepatitis B  If you have a higher risk for hepatitis B, you should be screened for this virus. You are considered at high risk for hepatitis B if: ? You were born in a country where hepatitis B is common. Ask your health care provider which countries are considered high risk. ? Your parents were born in a high-risk country, and you have not been immunized against hepatitis B (hepatitis B vaccine). ? You have HIV or AIDS. ? You use needles to  inject street drugs. ? You live with someone who has hepatitis B. ? You have had sex with someone who has hepatitis B. ? You get hemodialysis treatment. ? You take certain medicines for conditions, including cancer, organ transplantation, and autoimmune conditions.  Hepatitis C  Blood testing is recommended for: ? Everyone born from [birth year] through 1963. ?  Anyone with known risk factors for hepatitis C.  Sexually transmitted infections (STIs)  You should be screened for sexually transmitted infections (STIs) including gonorrhea and chlamydia if: ? You are sexually active and are younger than 82 years of age. ? You are older than 82 years of age and your health care provider tells you that you are at risk for this type of infection. ? Your sexual activity has changed since you were last screened and you are at an increased risk for chlamydia or gonorrhea. Ask your health care provider if you are at risk.  If you do not have HIV, but are at risk, it may be recommended that you take a prescription medicine daily to prevent HIV infection. This is called pre-exposure prophylaxis (PrEP). You are considered at risk if: ? You are sexually active and do not regularly use condoms or know the HIV status of your partner(s). ? You take drugs by injection. ? You are sexually active with a partner who has HIV.  Talk with your health care provider about whether you are at high risk of being infected with HIV. If you choose to begin PrEP, you should first be tested for HIV. You should then be tested every 3 months for as long as you are taking PrEP. Pregnancy  If you are premenopausal and you may become pregnant, ask your health care provider about preconception counseling.  If you may become pregnant, take 400 to 800 micrograms (mcg) of folic acid every day.  If you want to prevent pregnancy, talk to your health care provider about birth control (contraception). Osteoporosis and  menopause  Osteoporosis is a disease in which the bones lose minerals and strength with aging. This can result in serious bone fractures. Your risk for osteoporosis can be identified using a bone density scan.  If you are 50 years of age or older, or if you are at risk for osteoporosis and fractures, ask your health care provider if you should be screened.  Ask your health care provider whether you should take a calcium or vitamin D supplement to lower your risk for osteoporosis.  Menopause may have certain physical symptoms and risks.  Hormone replacement therapy may reduce some of these symptoms and risks. Talk to your health care provider about whether hormone replacement therapy is right for you. Follow these instructions at home:  Schedule regular health, dental, and eye exams.  Stay current with your immunizations.  Do not use any tobacco products including cigarettes, chewing tobacco, or electronic cigarettes.  If you are pregnant, do not drink alcohol.  If you are breastfeeding, limit how much and how often you drink alcohol.  Limit alcohol intake to no more than 1 drink per day for nonpregnant women. One drink equals 12 ounces of beer, 5 ounces of wine, or 1 ounces of hard liquor.  Do not use street drugs.  Do not share needles.  Ask your health care provider for help if you need support or information about quitting drugs.  Tell your health care provider if you often feel depressed.  Tell your health care provider if you have ever been abused or do not feel safe at home. This information is not intended to replace advice given to you by your health care provider. Make sure you discuss any questions you have with your health care provider. Document Released: 09/11/2010 Document Revised: 08/04/2015 Document Reviewed: 11/30/2014 Elsevier Interactive Patient Education  [employer].  It is important to avoid accidents which  may result in broken bones.  Here are a  few ideas on how to make your home safer so you will be less likely to trip or fall.  1. Use nonskid mats or non slip strips in your shower or tub, on your bathroom floor and around sinks.  If you know that you have spilled water, wipe it up! 2. In the bathroom, it is important to have properly installed grab bars on the walls or on the edge of the tub.  Towel racks are NOT strong enough for you to hold onto or to pull on for support. 3. Stairs and hallways should have enough light.  Add lamps or night lights if you need ore light. 4. It is good to have handrails on both sides of the stairs if possible.  Always fix broken handrails right away. 5. It is important to see the edges of steps.  Paint the edges of outdoor steps white so you can see them better.  Put colored tape on the edge of inside steps. 6. Throw-rugs are dangerous because they can slide.  Removing the rugs is the best idea, but if they must stay, add adhesive carpet tape to prevent slipping. 7. Do not keep things on stairs or in the halls.  Remove small furniture that blocks the halls as it may cause you to trip.  Keep telephone and electrical cords out of the way where you walk. 8. Always were sturdy, rubber-soled shoes for good support.  Never wear just socks, especially on the stairs.  Socks may cause you to slip or fall.  Do not wear full-length housecoats as you can easily trip on the bottom.  9. Place the things you use the most on the shelves that are the easiest to reach.  If you use a stepstool, make sure it is in good condition.  If you feel unsteady, DO NOT climb, ask for help. 10. If a health professional advises you to use a cane or walker, do not be ashamed.  These items can keep you from falling and breaking your bones.01/27/18

## 2017-11-19 ENCOUNTER — Encounter: Payer: Self-pay | Admitting: Pharmacist

## 2017-11-19 ENCOUNTER — Telehealth: Payer: Self-pay | Admitting: Pharmacist

## 2017-11-19 ENCOUNTER — Other Ambulatory Visit: Payer: Self-pay | Admitting: *Deleted

## 2017-11-19 NOTE — Patient Outreach (Signed)
[facility]) Care Management  11/19/2017  Rebecca [name] 1932/11/06 [id number]   Care coordination   [facility] RN CM attempted to call Rebecca [name]  Her telephone line is busy at this time  Plan [facility] RN CM will make another attempt to reach Rebecca [name] within 1-3 business days  Cecil L. Noelle Penner, RN, BSN, CCM [facility] Telephonic Care Management Care Coordinator Office number [phone] Mobile number [phone]  Main [facility] number [phone] Fax number [phone]

## 2017-11-19 NOTE — Patient Outreach (Signed)
[facility]) Care Management  11/19/2017  VERNELL FALCONER 01/11/1933 [id number]   Care coordination  [facility] RN CM called to speak with Mrs Agramonte about care coordination (telephonic, home) Mrs Zientara states today she does not prefer MD visits but welcomes home visits   Plans  Message left for [facility] RN CM   Cala Bradford L. Noelle Penner, RN, BSN, CCM [facility] Telephonic Care Management Care Coordinator Office number [phone] Mobile number [phone]  Main [facility] number [phone] Fax number [phone]

## 2017-11-19 NOTE — Patient Outreach (Signed)
[facility]) Care Management  [place]   11/19/2017  SAVERA DONSON 1933/02/24 [id number]  Subjective: Patient was called regarding medication assistance. HIPAA identifiers were obtained. Patient is an 84 year old female with multiple medical conditions including but not limited to:  Type 2 diabetes, esophageal reflux disease, vitamin d deficiency, hyperlipidemia, hiatal hernia, hypertension, coronary atherosclerosis, and chronic back pain secondary to degenerative disc disease (lumbar).  Patient expressed concern paying for Novolin 70/30 insulin vials. One vial costs her $25.   Objective:  HgA1c-7.4% HDL-75 LDL-93 TG- 68  Encounter Medications: Outpatient Encounter Medications as of 11/19/2017  Medication Sig Note  . amLODipine (NORVASC) 10 MG tablet TAKE 1 TABLET BY MOUTH ONCE DAILY   . Ascorbic Acid (VITAMIN C) 500 MG tablet Take 500 mg by mouth 2 (two) times daily.     Marland Kitchen aspirin 81 MG tablet Take 81 mg by mouth daily.     Marland Kitchen atorvastatin (LIPITOR) 10 MG tablet TAKE 1 TABLET BY MOUTH ONCE DAILY   . Blood Glucose Monitoring Suppl (ONE TOUCH ULTRA 2) w/Device KIT Use to check blood sugars twice a day Dx e11.9   . calcium carbonate (OS-CAL) 600 MG TABS Take 600 mg by mouth daily.    Marland Kitchen Cod Liver Oil 1000 MG CAPS Take 1,000 mg by mouth daily. During the winter   . glucose blood (ONE TOUCH ULTRA TEST) test strip 1 each by Other route 2 (two) times daily. Use to check blood sugars twice a day Dx E11.9   . hydrochlorothiazide (MICROZIDE) 12.5 MG capsule Take 1 capsule (12.5 mg total) by mouth daily as needed.   . insulin NPH-regular Human (NOVOLIN 70/30) (70-30) 100 UNIT/ML injection Inject 15 units in the AM and 10 units in the PM (Patient taking differently: Inject 15 units in the AM and 7-10 units in the PM)   . Isopropyl Alcohol (PHARMACIST CHOICE ALCOHOL) 70 % MISC Use with Glucose meter to check sugars daily DX E11.42   . isosorbide mononitrate (IMDUR)  30 MG 24 hr tablet TAKE 1 TABLET BY MOUTH ONCE DAILY   . latanoprost (XALATAN) 0.005 % ophthalmic solution 1 drop at bedtime.   . lidocaine (LIDODERM) 5 % APPLY 1 PATCH TOPICALLY ONCE DAILY(REMOVE AND DISCARD PATCH WITHIN 12 HOURS OR AS DIRECTED BY MD)   . meclizine (ANTIVERT) 25 MG tablet Take 1/2 to 1 tab three times a day as needed 12/28/2015: PRN  . Multiple Vitamin (MULTIVITAMIN) tablet Take 1 tablet by mouth daily. Prn   . naproxen (NAPROSYN) 500 MG tablet TAKE 1 TABLET BY MOUTH TWICE DAILY WITH A MEAL   . nitroGLYCERIN (NITROSTAT) 0.4 MG SL tablet DISSOLVE ONE TABLET UNDER TONGUE AS DIRECTED FOR CHEST PAIN 12/28/2015: PRN, emergency chest pain rescue  . omeprazole (PRILOSEC) 20 MG capsule TAKE 1 CAPSULE BY MOUTH IN THE MORNING   . ONETOUCH DELICA LANCETS 40J MISC USE 1  TO CHECK GLUCOSE TWICE DAILY   . quinapril (ACCUPRIL) 40 MG tablet TAKE 1 TABLET BY MOUTH ONCE DAILY    No facility-administered encounter medications on file as of 11/19/2017.     Functional Status: In your present state of health, do you have any difficulty performing the following activities: 11/18/2017  Hearing? N  Vision? N  Difficulty concentrating or making decisions? N  Walking or climbing stairs? N  Dressing or bathing? Y  Doing errands, shopping? Y  Preparing Food and eating ? Y  Using the Toilet? N  In the past six  months, have you accidently leaked urine? N  Do you have problems with loss of bowel control? N  Managing your Medications? N  Managing your Finances? N  Housekeeping or managing your Housekeeping? Y  Some recent data might be hidden    Fall/Depression Screening: Fall Risk  11/18/2017 11/04/2017 07/23/2016  Falls in the past year? Yes Yes No  Number falls in past yr: 2 or more 2 or more -  Injury with Fall? No No -  Risk Factor Category  High Fall Risk High Fall Risk -  Risk for fall due to : Impaired mobility;Impaired balance/gait - -  Follow up Education provided;Falls prevention  discussed - -   PHQ 2/9 Scores 11/18/2017 11/18/2017 07/23/2016 12/28/2015 10/24/2015 04/01/2013  PHQ - 2 Score 0 1 0 0 0 0  PHQ- 9 Score - - - 2 - -      Assessment: Patient's medications were reviewed via telephone.   Drugs sorted by system:  Cardiovascular: Amlodipine, Aspirin, Atorvastatin, HCTZ, Isosorbide Mononitrate, quinapril, nitroglycerin   Gastrointestinal: Meclizine, Omeprazole,   Endocrine: Novolin 70/30  Pain: Naproxen  Vitamins/Minerals: Ascorbic acid, cod liver oil, calcium carbonate, Multiple Vitamin,   Miscellaneous: Latanoprost Dorzolamide-Timolol   Medication Review Findings: Vitamin C dose of 1 tablet BID is above the recommended 1 tablet daily dose.  Medication Assistance Findings: Patient has [place] She also has 50% Extra Help She uses [facility] Novolin 70/30 insulin vials which are not covered by her insurance [place] was called with the patient on speaker phone and it was determined that she still has naproxen $22 left to spend to reach her deductible. A test claim was run for Novolin 70/30 (Brand Name) and the copay was $32  In an effort to save the patient money on her OTC products, the [place] was called. Unfortunately, the patient's plan does not have an OTC benefit attached to it.   Patient has spent about $297.00 in out of pocket medication expenses.  [employer] Patient Assistance Program requires patients to spend at least $1000 in out of pocket medication expenses.  In addition, since the patient has 50% Extra Help, she may not be eligible for their program as they often require a rejection letter from [place] when patient's income is close to the income guideline cut offs.  It may be an option to have the patient switched to Humulin 70/30 and go through the [name] if a hardship letter is written.   Plan: Patient was encouraged to speak with her insurance agent about  choosing a plan next year that has an OTC benefit to help offset the cost of her insulin vials.    Patient was reminded about open enrollment will be starting December 24, 2017.  Note will be routed to the patient's provider. To see if they would be willing to switch the patient to Humulin 70/30.  Follow up on 3-5 business days.   Elayne Guerin, PharmD, [place] Clinical Pharmacist [phone]

## 2017-11-20 ENCOUNTER — Other Ambulatory Visit: Payer: Self-pay

## 2017-11-20 ENCOUNTER — Encounter: Payer: Self-pay | Admitting: Pharmacist

## 2017-11-20 NOTE — Patient Outreach (Signed)
[facility]) Care Management  11/20/2017  SUSAN DUSEL 15-Mar-1932 [id number]  BSW attempted to contact the patient on today's date to conduct a community resource consult. Unfortunately, today's call was unsuccessful. BSW left the patient a HIPAA compliant voice message requesting a return call.   Plan: BSW will mail the patient an unsuccessful outreach letter. BSW will attempt the patient again within the next four business days.  Bevelyn Ngo, BSW, CDP [facility] [phone]

## 2017-11-21 ENCOUNTER — Other Ambulatory Visit: Payer: Self-pay

## 2017-11-21 ENCOUNTER — Other Ambulatory Visit: Payer: Self-pay | Admitting: *Deleted

## 2017-11-21 DIAGNOSIS — R69 Illness, unspecified: Secondary | ICD-10-CM | POA: Diagnosis not present

## 2017-11-21 NOTE — Patient Outreach (Signed)
[facility](THN) Care Management  11/21/2017  Rebecca [name] [birth year] [id number]   Referral received from telephonic care manager as member has had falls recently and is in need of assistance managing chronic medical conditions.  Per chart, she has history of hypertension, diabetes, and hyperchloesterolemia.  Call placed to member, identity verified.  This care manager introduced self and stated purpose of call.  She report she is in need of some assistance, unable to state specifically what is needed.  Agrees to home visit within the next 2 weeks, will perform assessment at that time and develop individualized care plan.  Rebecca [name], [place]RN, MSN [facility] Care Management  [facility] Care Manager [phone]

## 2017-11-21 NOTE — Patient Outreach (Signed)
[facility](THN) Care Management  11/21/2017  Rebecca Canalesarthenia W [facility] 02-13-1933 [id number]  BSW received an incoming call from the patient, HIPAA identifiers confirmed. BSW introduced self to the patient and discussed the patients recent referral for assistance with financial resources. BSW completed a social determinant of health (SDOH) screening on the patient during today's call. The patient lives alone and has 1 son. The patient reports she receives both social security retirement income as well as a pension. Both incomes combined puts the patient over the eligibility threshold to receive Medicaid.  The patient reports she receives daily meals on wheels as well as a monthly grocery delivery through a community program. The patient has several social determinants of health including food insecurities, concerns with loosing housing, and financial insecurities surrounding utility payments and cost of medications. The patient reports she is out of arthritis medication and can not afford to purchase. The patient also reports the cost of insulin makes it difficult to afford other medications. The patient reports a strong church network, she attends weekly church services as well as a bible study on Wednesday's. The patient uses her sons car when needed to driver herself to physician appointments and church. The patient reports she often receives rides with friends to other locations.   BSW offered to provide the patient with a list of local food pantries. The patient declined this list stating it is difficult for her to get around with her walker and she is unsure how she could successfully carry items from a food pantry while using a walker. BSW educated the patient on congregate meal sites at [employer] churches and senior centers. The patient is not interested in this information considering she already receives meals on wheels. BSW offered to assist the patient in the completion of a food stamp  application. The patient is agreeable to this.  Home visit scheduled for Wednesday September 18. BSW will assist the patient in completing a food stamp application, MOST form, and link to other identified resource needs.  Bevelyn NgoKendra Manette Doto, BSW, CDP [facility] Care Management Social Worker [phone]

## 2017-11-22 ENCOUNTER — Other Ambulatory Visit: Payer: Self-pay | Admitting: Pharmacist

## 2017-11-22 ENCOUNTER — Telehealth: Payer: Self-pay | Admitting: Internal Medicine

## 2017-11-22 ENCOUNTER — Other Ambulatory Visit: Payer: Self-pay

## 2017-11-22 NOTE — Telephone Encounter (Signed)
Copied from CRM [id number]. Topic: General - Other >> Nov 22, 2017 12:21 PM Tamela OddiHarris, Cuinn Westerhold J wrote: Reason for CRM: Patient called to confirm that Dr. Lawerance BachBurns received the fax from Level 4 for patient's special shoes.  Patient also indicated that she needs the two braces for her ankles because she is walking on her bone and she tends to fall without the braces.  Level 4 telephone # is [phone].  Patient would like this as soon as possible and can be reached at [phone]

## 2017-11-23 NOTE — Telephone Encounter (Signed)
I have not received the fax yet.  It may come in the next couple of days.  We can keep our eye out for it or she can call them and have them refax it

## 2017-11-25 ENCOUNTER — Telehealth: Payer: Self-pay | Admitting: Pharmacist

## 2017-11-25 ENCOUNTER — Ambulatory Visit: Payer: Self-pay | Admitting: Pharmacist

## 2017-11-25 ENCOUNTER — Other Ambulatory Visit: Payer: Self-pay | Admitting: Pharmacy Technician

## 2017-11-25 NOTE — Telephone Encounter (Signed)
-----   Message from Pincus SanesStacy J Burns, MD sent at 11/23/2017  1:11 PM EDT ----- Yes, I think we should try for the Humulin 70/30.  Please fax the form to my office.    Cheryll CockayneStacy Burns ----- Message ----- From: Beecher McardleBoyd, Gentri Guardado J, [facility] CenterRPH Sent: 11/20/2017  11:28 AM EDT To: Pincus SanesStacy J Burns, MD  Dr. Lawerance BachBurns,  Please see the attached [facility] Pharmacist's note.  [facility] due to medication affordability issues with Novolin 70/30.  Although she is purchasing the OTC insulin for $25 per vial, the patient says this is still cost prohibitive to her.  Because she has partial "extra help" through social security and has not spent the required $1000 in out-of-pocket medication expenses, she is not eligible for Novo Nordisk's brand name Novolin 70/30 through their program.  However, we could attempt to get Humuliin 70/30 for her at no cost from [name]'s program as they will allow patients to submit an override form.    What are your thoughts on trying to get Humulin 70/30 for the patient through [name]'s program to help her with cost? If you are ok with that, a medication assistance application form can be faxed to your office for signature and completion.  Our office will handle correspondence with the program.  Thank you so much for your time and consideration.  Blessings,   Beecher McardleKatina J. Sidrah Harden, PharmD, [facility] CenterBCACP [facility] Clinical Pharmacist [phone]

## 2017-11-25 NOTE — Patient Outreach (Signed)
[facility](THN) Care Management  11/25/2017  Rebecca [name] 08/08/1932 [id number]   Received confirmation from the patient's provider that she can be switched from Relion Novolin 70/30 to Humulin 70/30.  Called patient to discuss patient assistance program options but she did not answer the phone. Her phone rang more than 20 times and did not allow a message to be left.  Plan: Send letter to Certified Pharmacy Technician, Rebecca [name] to send an application to the patient for Humulin.    Will call patient back in 2-3 business days to request financial documentation, her print out from [employer], and to have her sign the hardship letter.  Beecher McardleKatina J. [name], PharmD, BCACP [facility] Clinical Pharmacist [phone]

## 2017-11-25 NOTE — Telephone Encounter (Signed)
Called pt and level 4 to advise that we have not received a fax. Level 4 had the wrong fax number. Gave the correct fax number.

## 2017-11-25 NOTE — Patient Outreach (Signed)
[facility](THN) Care Management  11/25/2017  Rebecca [name] 05/27/1932 [id number]   Received [name] patient assistance referral from [facility] RPh Nunzio CobbsKAtina Boyd for Humulin 70/30. Prepared patient portion application along with "Letter of Hardship" to be mailed. Faxed provider portion to Dr. Lawerance BachBurns.  Will follow up with patient in 7-10 business days to confirm application has been received.  Suzan SlickAshley N. Ernesta Ambleoleman, CPhT [facility] Care Management [phone]

## 2017-11-26 ENCOUNTER — Ambulatory Visit: Payer: Self-pay

## 2017-11-27 ENCOUNTER — Other Ambulatory Visit: Payer: Self-pay | Admitting: Pharmacist

## 2017-11-27 ENCOUNTER — Other Ambulatory Visit: Payer: Self-pay | Admitting: Internal Medicine

## 2017-11-27 ENCOUNTER — Other Ambulatory Visit: Payer: Self-pay

## 2017-11-27 MED ORDER — INSULIN NPH ISOPHANE & REGULAR (70-30) 100 UNIT/ML ~~LOC~~ SUSP: SUBCUTANEOUS | 3 refills | Status: DC

## 2017-11-27 NOTE — Patient Outreach (Signed)
[facility](THN) Care Management  11/27/2017  Elease Hashimotoarthenia W Lingafelter [birth year] [id number]  Initial home visit with the patient to assist with the completion of a food and nutrition application as well as a MOST form. HIPAA identifiers confirmed. [facility] consent form signed by the patient. Rights and responsibilities were also reviewed with the patient. The patient lives alone in her home and is independent with ADL's and iADL's. The patients son, Billey GoslingCharlie, lives adjacent to her home. He is her only living son as her other son has passed away. The patient reports her son works two jobs and is not home often. However, he is supportive and willing to assist with patient needs when necessary.  The patient spoke with BSW about a left foot deformity and the need for diabetic shoes. The patient reports chronic pain and difficulty walking. This deformity often leads to falls. The patient reports she has been seen by Dr. Lawerance BachBurns who has agreed to place an order for diabetic shoes. This order was sent to level 4 ([address] [phone]). The patient also reports Level 4 notifying the patient the order is still pending due to awaiting more information from Dr. Lawerance BachBurns. BSW to contact Level 4 to inquire status and assist with navigating process.  BSW assisted the patient in the completion of a food and nutrition support application. The patient reports receiving food stamps years ago when she took care of her sisters children. BSW will submit the application for the patient. The patient understands she will be contacted by DSS directly to discuss her benefit eligibility.   The patient reports she used to receive energy assistance by the [employer] program [phone]). Unfortunately, the patient states she lost the benefit due to the inability to stand in line to renew. The patient informed BSW she lost this benefit over a year ago. The patient reports she mailed a letter explaining she was unable  to stand in long lines due to her age and left foot deformity. Although the patient mailed a letter she still "lost it because I didn't wait in line". BSW offered to contact [employer] to inquire if the patient could reinstate this benefit.   The patient has an advance directive and is not interested in making changes at this time. The patient does not have a MOST form and is interested in discussing this form with BSW. BSW reviewed a MOST form with the patient and discussed each section in depth. The patient has established she would like to be a DNR with limited interventions. The patient has stated she does not want intubation, mechanical ventilation, or a feeding tube. The patient is agreeable to receiving IV fluids as well as limited interventions. BSW explained to the patient the importance of having the MOST form signed by her physician. The patient stated understanding. The patient is concerned that she is not scheduled to see her physician until February. BSW encouraged the patient to review the form with her son Billey GoslingCharlie who is listed as her healthcare agent. BSW also encouraged the patient to call her physicians office to inquire if the form could be completed telephonically. The patient stated understanding.   Plan: BSW to contact the patients physician as well as Level 4 in efforts to obtain diabetic shoes for the patient. BSW will contact the patient within the next two weeks for updates regarding the patients diabetic shoes as well as food and nutrition application.  Bevelyn NgoKendra Tahira Olivarez, BSW, CDP [facility] Care Management Social  Worker [phone]

## 2017-11-27 NOTE — Patient Outreach (Signed)
[facility](THN) Care Management  11/27/2017  Rebecca [name] 23-Jan-1933 [id number]   Patient was called to follow up on medication assistance with 70/30 insulin.  Dr. Lawerance BachBurns was contacted and asked to switch the patient to Humulin 70/30 as she has been purchasing Relion Novolin 70/30 insulin over the counter and this has been cost prohibitive for her.    HIPAA identifiers were obtained. Patient confirmed she only had a few doses of insulin left.  [name] was called with the patient on speaker phone.  The [employer] Representative emailed a coupon on the patient's behalf for Humulin 70/30 (one month supply).  Patient was previously sent a [employer] application for their patient assistance program.  [facility] was called and I spoke with the Pharmacist, Rebecca [name].  The coupon was faxed them at: [phone]  Plan: Follow up with the patient tomorrow to make sure everything goes through.   Rebecca [name], PharmD, BCACP [facility] Clinical Pharmacist [phone]

## 2017-11-28 ENCOUNTER — Other Ambulatory Visit: Payer: Self-pay

## 2017-11-28 NOTE — Telephone Encounter (Signed)
Rebecca [name] SW with [facility]  Ph # [phone]  Rebecca [name] calling on pts behalf. The pt is concerned that the shoes are taking so long. I advised that we called Level 4 to refax form 11/25/17. Rebecca [name] states she contacted [facility] this morning and they advised it was refaxed 9/16 and has not been returned. Rebecca [name] is aware it takes 3 weeks to make shoes after the forms are returned and will notify the pt of this. Please call Rebecca [name] and/or pt with status of form.

## 2017-11-28 NOTE — Telephone Encounter (Signed)
LVM with Rebecca [name] advising that I have not seen Level 4 form at all. I gave direct fax number to get to me on her VM asking to try to refax for.

## 2017-11-28 NOTE — Patient Outreach (Signed)
[facility](THN) Care Management  11/28/2017  [name] 02/10/1933 [id number]  BSW contacted Level 4, spoke with Rosey Batheresa. Rosey Batheresa informed BSW that the patient has been seen in the Level 4 office and had molds completed for diabetic shoes. Rosey Batheresa is currently awaiting the diabetic packet to be signed by Dr. Lawerance BachBurns and supporting physician notes indicating why the patient is in need of diabetic shoes. Rosey Batheresa stated without these documents, Medicare will not cover the cost. Rosey Batheresa also informed BSW she contacted Dr. Lawerance BachBurns office on 9/16 to inquire status of paperwork. Rosey Batheresa was informed paperwork had not been received, Rosey Batheresa re-faxed the paperwork to a new fax number provided by [facility]Burns office.  BSW contact Dr. Lawerance BachBurns office and spoke with Aniwahristy. Neysa BonitoChristy stated the last note entered on 9/16 established the office had not received the paperwork and had contacted Level 4 to provide an alternate fax number. Neysa BonitoChristy stated there is no update as to if the paperwork was received. BSW requested Neysa BonitoChristy confirm paperwork is received considering the patient is in chronic pain and needs this paperwork completed prior to the shoes being made. BSW explained that once the paperwork is sent to Level 4 it will still take at least 3 weeks for the patient to receive the shoes. Neysa BonitoChristy stated understanding and informed BSW she would send a message to Dr. Lawerance BachBurns assistant requesting the patient be contacted regarding the status of the packet.  BSW updated the patient with the above information. BSW encouraged the patient to contact Dr. Lawerance BachBurns office in the next two business days if communication is not received regarding the completion of needed paperwork. The patient stated understanding.  Bevelyn NgoKendra Jacari Iannello, BSW, CDP [facility] Care Management Social Worker [phone]

## 2017-11-28 NOTE — Patient Outreach (Signed)
[facility](THN) Care Management  11/28/2017  Elease Hashimotoarthenia W [place]pack 10/08/1932 [id number]  BSW received a voice message from Ladona Ridgelaylor, Dr. Lawerance BachBurns assistant stating the fax has yet to be received. Ladona Ridgelaylor left an alternate fax for BSW [phone]). BSW contacted Level 4 and spoke with Rosey Batheresa to provide this alternate fax number. Rosey Batheresa stated she would refax documents.  Bevelyn NgoKendra Vollie Aaron, BSW, CDP [facility] Care Management Social Worker [phone]

## 2017-11-29 ENCOUNTER — Other Ambulatory Visit: Payer: Self-pay | Admitting: Pharmacist

## 2017-11-29 NOTE — Patient Outreach (Signed)
[facility](THN) Care Management  11/29/2017  Rebecca [name] [birth year] [id number]  [facility] was called to inquire about Humulin 70/30 being filled on the coupon that was faxed to them.  [facility] confirmed the prescription went though at $0.00.    Patient was called. HIPAA identifiers were obtained.  Patient said she would go and pick up her prescription.  She also said she will be on the look out for the patient assistance applications that were mailed to her home.   Plan: Call patient back in 2 weeks to follow up on the application.   Beecher McardleKatina J. Binh Doten, PharmD, BCACP [facility] Clinical Pharmacist [phone]

## 2017-12-01 ENCOUNTER — Encounter: Payer: Self-pay | Admitting: Internal Medicine

## 2017-12-01 DIAGNOSIS — M2140 Flat foot [pes planus] (acquired), unspecified foot: Secondary | ICD-10-CM | POA: Insufficient documentation

## 2017-12-02 ENCOUNTER — Other Ambulatory Visit: Payer: Self-pay | Admitting: Pharmacy Technician

## 2017-12-02 ENCOUNTER — Other Ambulatory Visit: Payer: Self-pay

## 2017-12-02 ENCOUNTER — Other Ambulatory Visit: Payer: Self-pay | Admitting: *Deleted

## 2017-12-02 NOTE — Patient Outreach (Signed)
[place] [facility]) Care Management   12/02/2017  Rebecca [name] Jul 28, 1932 [id number]  Rebecca [name] is an 84 y.o. female  Subjective:   Member alert and oriented x3, denies pain or discomfort at this time, does have intermittent pain particularly left foot due to deformity.  She wears a brace but has been waiting for her MD office to complete paperwork for new support/diabetic shoes.  Report compliance with all medications, denies questions.  Objective:   Review of Systems  Constitutional: Negative.   HENT: Negative.   Eyes: Negative.   Respiratory: Negative.   Cardiovascular: Negative.   Gastrointestinal: Negative.   Genitourinary: Negative.   Musculoskeletal: Positive for joint pain.  Skin: Negative.   Neurological: Negative.   Endo/Heme/Allergies: Negative.   Psychiatric/Behavioral: Negative.     Physical Exam  Constitutional: She appears well-developed and well-nourished.  Neck: Normal range of motion.  Cardiovascular: Normal rate, regular rhythm and normal heart sounds.  Respiratory: Effort normal and breath sounds normal.  GI: Soft. Bowel sounds are normal.   BP 128/62 (BP Location: Left Arm, Patient Position: Sitting, Cuff Size: Normal)   Pulse (!) 51   Resp 18   Ht 1.6 m (_0 )   Wt 200 lb (90.7 kg)   SpO2 98%   BMI 35.43 kg/m   Encounter Medications:   Outpatient Encounter Medications as of 12/02/2017  Medication Sig Note  . amLODipine (NORVASC) 10 MG tablet TAKE 1 TABLET BY MOUTH ONCE DAILY   . Ascorbic Acid (VITAMIN C) 500 MG tablet Take 500 mg by mouth 2 (two) times daily.     Marland Kitchen aspirin 81 MG tablet Take 81 mg by mouth daily.     Marland Kitchen atorvastatin (LIPITOR) 10 MG tablet TAKE 1 TABLET BY MOUTH ONCE DAILY   . Blood Glucose Monitoring Suppl (ONE TOUCH ULTRA 2) w/Device KIT Use to check blood sugars twice a day Dx e11.9   . calcium carbonate (OS-CAL) 600 MG TABS Take 600 mg by mouth daily.    Marland Kitchen Cod Liver Oil 1000 MG CAPS Take 1,000 mg  by mouth daily. During the winter   . DORZOLAMIDE HCL-TIMOLOL MAL OP Place 1 drop into the right eye 2 (two) times daily.   Marland Kitchen glucose blood (ONE TOUCH ULTRA TEST) test strip 1 each by Other route 2 (two) times daily. Use to check blood sugars twice a day Dx E11.9   . hydrochlorothiazide (MICROZIDE) 12.5 MG capsule Take 1 capsule (12.5 mg total) by mouth daily as needed.   . insulin NPH-regular Human (HUMULIN 70/30) (70-30) 100 UNIT/ML injection Inject 15 units in the morning and 7-10 units in the evening   . Isopropyl Alcohol (PHARMACIST CHOICE ALCOHOL) 70 % MISC Use with Glucose meter to check sugars daily DX E11.42   . isosorbide mononitrate (IMDUR) 30 MG 24 hr tablet TAKE 1 TABLET BY MOUTH ONCE DAILY   . latanoprost (XALATAN) 0.005 % ophthalmic solution 1 drop at bedtime.   . lidocaine (LIDODERM) 5 % APPLY 1 PATCH TOPICALLY ONCE DAILY(REMOVE AND DISCARD PATCH WITHIN 12 HOURS OR AS DIRECTED BY MD)   . meclizine (ANTIVERT) 25 MG tablet Take 1/2 to 1 tab three times a day as needed 12/28/2015: PRN  . Multiple Vitamin (MULTIVITAMIN) tablet Take 1 tablet by mouth daily. Prn   . naproxen (NAPROSYN) 500 MG tablet TAKE 1 TABLET BY MOUTH TWICE DAILY WITH A MEAL   . nitroGLYCERIN (NITROSTAT) 0.4 MG SL tablet DISSOLVE ONE TABLET UNDER TONGUE AS DIRECTED FOR CHEST  PAIN 12/28/2015: PRN, emergency chest pain rescue  . omeprazole (PRILOSEC) 20 MG capsule TAKE 1 CAPSULE BY MOUTH IN THE MORNING   . ONETOUCH DELICA LANCETS 20B MISC USE 1  TO CHECK GLUCOSE TWICE DAILY   . quinapril (ACCUPRIL) 40 MG tablet TAKE 1 TABLET BY MOUTH ONCE DAILY    No facility-administered encounter medications on file as of 12/02/2017.     Functional Status:   In your present state of health, do you have any difficulty performing the following activities: 12/02/2017 11/18/2017  Hearing? N N  Vision? Y N  Difficulty concentrating or making decisions? N N  Walking or climbing stairs? Y N  Dressing or bathing? Y Y  Doing errands,  shopping? [name]  Preparing Food and eating ? Y Y  Using the Toilet? N N  In the past six months, have you accidently leaked urine? N N  Do you have problems with loss of bowel control? N N  Managing your Medications? N N  Managing your Finances? N N  Housekeeping or managing your Housekeeping? Y Y  Some recent data might be hidden    Fall/Depression Screening:    Fall Risk  11/18/2017 11/04/2017 07/23/2016  Falls in the past year? Yes Yes No  Number falls in past yr: 2 or more 2 or more -  Injury with Fall? No No -  Risk Factor Category  High Fall Risk High Fall Risk -  Risk for fall due to : Impaired mobility;Impaired balance/gait - -  Follow up Education provided;Falls prevention discussed - -   PHQ 2/9 Scores 11/18/2017 11/18/2017 07/23/2016 12/28/2015 10/24/2015 04/01/2013  PHQ - 2 Score 0 1 0 0 0 0  PHQ- 9 Score - - - 2 - -    Assessment:    Met with member at scheduled time, assessment complete.  She verbalizes gratitude regarding help from [employer] and pharmacy so far.  Pharmacy has been helping with medication assistance, helped member get her insulin.  BSW has been helping with community resources, including getting diabetic shoes.    Report she has been managing her diabetes for years, able to verbalize proper diet.  State blood sugar today was 101, last A1C was 7.4, diabetes stable.  He main concern is getting her shoes in effort to decrease fall risk.  Denies any urgent concerns, provided with this care manager's contact information.  Plan:   Will follow up with member within the next month.  THN CM Care Plan Problem One     Most Recent Value  Care Plan Problem One  Risk for fall related to defored foot as evidenced by recent falls   Role Documenting the Problem One  Care Management [place] for Problem One  Active  [facility] Long Term Goal   Member will have diabetic/support shoes within the next 2 months  [facility] Long Term Goal Start Date  12/02/17  Interventions for Problem  One Long Term Goal  Collaborated with BSW regarding completion of forms from MD office.  She will follow up with MD and supplier.  [facility] CM Short Term Goal #1   Member will report no falls within the next 4 weeks  [facility] CM Short Term Goal #1 Start Date  12/02/17  Interventions for Short Term Goal #1  Educated on fall prevention techniques: use of walker, cane, and life alert system  [facility] CM Short Term Goal #2   Member will report plan for life alert system within the next 4 weeks  [facility]N CM Short Term Goal #2 Start Date  12/02/17  Interventions for Short Term Goal #2  Educated on importance of having system in place in case of a fall as she lives alone.  Advised to contact insurance company to inquire about benefits for this particular service.     Valente David, [place], MSN [place] [phone]

## 2017-12-02 NOTE — Patient Outreach (Signed)
[facility](THN) Care Management  12/02/2017  Rebecca [name] 18-Aug-1932 [id number]  BSW contacted [facility] to inquire status of patients medical packet needed to obtain diabetic shoes. BSW left a voice message requesting a return call.  [name], BSW, CDP [facility] Care Management Social Worker [phone]

## 2017-12-02 NOTE — Patient Outreach (Signed)
[facility]N) Care Management  12/02/2017  Rebecca [name] [birth year] [id number]   Return call to patient, HIPAA identifiers verified. Patient states that with all the documents she needs to send back it will not fit in envelope provided. While on phone with patient, [facility] Kemper DurieMonica Lane arrived at home, I spoke to WoodlawnMonica and requested that she bring paperwork into office the next time she comes and she agreed.   Once documents have been received from patient and provider, I will submit to [facility] patient assistance.  Refaxing provider portion to Dr. Lawerance BachBurns.  Suzan SlickAshley N. Ernesta Ambleoleman, CPhT [facility] Care Management [phone]

## 2017-12-09 ENCOUNTER — Telehealth: Payer: Self-pay | Admitting: *Deleted

## 2017-12-09 NOTE — Telephone Encounter (Signed)
Pt called back today and said that she needs a Rx for ankle braces send over to [facility] on [address] Fax [phone]. She really needs this done from Dr. Lawerance Bach.

## 2017-12-09 NOTE — Telephone Encounter (Signed)
Called patient in response to a VM she left nurse. Patient discussed that she is getting diabetic shoes of which an order has already been written by PCP. Patient states, she would also like to have an order for a left ankle brace to assist her with gait imbalance and would like this to be faxed to [facility]. Fax# [phone].

## 2017-12-09 NOTE — Telephone Encounter (Signed)
rx written - will be faxed - see other phone note

## 2017-12-09 NOTE — Telephone Encounter (Signed)
Rx faxed

## 2017-12-09 NOTE — Telephone Encounter (Signed)
rx written - will fax

## 2017-12-11 ENCOUNTER — Telehealth: Payer: Self-pay

## 2017-12-11 NOTE — Telephone Encounter (Signed)
All recent office notes to support the need of left ankle brace have been faxed to [facility] along with the rx for the ankle brace.

## 2017-12-12 ENCOUNTER — Other Ambulatory Visit: Payer: Self-pay

## 2017-12-12 NOTE — Patient Outreach (Signed)
[facility]) Care Management  12/12/2017  PINA SIRIANNI 1932/11/30 [id number]  Successful call to the patient on today's date, HIPAA identifiers confirmed. The patient confirms she has been in touch with [employer] and submitted documents via mail on today's date. The patient is aware of her contact person at DSS pertaining to this service. The patient confirms the paperwork was completed and submitted to obtain diabetic shoes and brace. The patient is aware she will receive these items once they are manufactured.  The patient discusses concerns with a utility bill that is due on October 12. The patient reports concerns with an inability to pay. BSW asked the patient if she has contacted [employer] to discuss a payment plan. The patient stated "no because I don't want them adding on fees". BSW contacted DSS to inquire on available programs.   BSW spoke with [facility]. Unfortunately, the LIEP program the patient has accessed in the past only operates from December 1 - December 31 and provides a one time bill payment. BSW did learn the patient would qualify for the Crisis Program which provides utility payment to those who have a 5 day disconnection notice. [place] explained the patient would have to come to DSS with this notice, social security card, proof of income, and proof of mortgage.   BSW contacted the patient to discuss this program if she is unable to pay the above stated amount. The patient stated "I don't want to let it go because I will go ask for help and they will tell me they can't". BSW explained that unfortunately this was the only resource BSW was aware of. The patient stated understanding.  BSW to perform a discipline closure as the patient has been provided resources. BSW will update other members of the patients care team.  Bevelyn Ngo, BSW, CDP [facility] Management Social Worker [phone]

## 2017-12-13 ENCOUNTER — Ambulatory Visit: Payer: Self-pay | Admitting: Pharmacist

## 2017-12-13 ENCOUNTER — Other Ambulatory Visit: Payer: Self-pay | Admitting: Pharmacist

## 2017-12-13 NOTE — Patient Outreach (Addendum)
[facility]) Care Management  12/13/2017  Rebecca [name] Mar 09, 1933 [id number]   Patient was called to follow up on medication assistance. Unfortunately she did not answer the phone. HIPAA compliant message was left on her voicemail.  Spoke with [facility] Pharmacy Technician, Lilla Shook who stated she received the patient's paperwork from [facility] Nurse, [place]. Morrie Sheldon is planning on following up with [name] on 12/17/17.  [facility] was called to be sure the patient was able to pick up the Humulin 70/30 using the coupon from [employer] and the Pharmacist confirmed that she did.  Plan: Patient will be followed by Lilla Shook, CPhT   Beecher Mcardle, PharmD, [facility] Clinical Pharmacist [phone]  ADDENDUM  Patient called me back. HIPAA identifiers were obtained. Patient confirmed she received her insulin at no cost using the [place] coupon and that she gave her paperwork to [employer], Charity fundraiser. She was informed about the waiting time for application processing.  Lilla Shook will follow up with the patient.  Beecher Mcardle, PharmD, BCACP [facility] Clinical Pharmacist [phone]

## 2017-12-17 ENCOUNTER — Other Ambulatory Visit: Payer: Self-pay | Admitting: Pharmacy Technician

## 2017-12-17 ENCOUNTER — Ambulatory Visit: Payer: Medicare HMO | Admitting: Pharmacy Technician

## 2017-12-17 NOTE — Patient Outreach (Signed)
[facility]) Care Management  12/17/2017  Rebecca [name] [birth year] [id number]   Follow up call to check status of patients application for Humulin. Adair Laundry confirmed that patient has been approved as of 10/8 until 03/11/18. Patient should received medication in the next 7-10 business days.  Will follow up with patient in 10-14 business days to confirm medication has been received.  Suzan Slick Ernesta Amble [facility] Care Management [phone]

## 2017-12-19 NOTE — Telephone Encounter (Signed)
Error

## 2017-12-26 ENCOUNTER — Other Ambulatory Visit: Payer: Self-pay | Admitting: *Deleted

## 2017-12-26 ENCOUNTER — Other Ambulatory Visit: Payer: Self-pay | Admitting: Pharmacy Technician

## 2017-12-26 NOTE — Patient Outreach (Signed)
[facility]) Care Management  12/26/2017  Rebecca [name] March 26, 1932 [id number]   Care coordination   [facility] RN CM received a message from this patient stating she was trying to reach the person who assisted her with Lily cares application [facility] RN CM returned a call to the patient Patient returned a call to [facility] RN CM Patient is able to verify HIPAA CM provided [facility] pharmacy tech contact number as the patient states she is unable to find it. CM encouraged her to call pharmacy tech when the patient informed CM she had called Lily cares and was informed 4 bottles of insulin was sent to her primary MD and she needs to provide information to her insurance carrier She denied any other medical concerns or needs during this call and voiced appreciation for the return call and information provided   Kimberly L. Noelle Penner, RN, BSN, CCM [facility] Telephonic Care Management Care Coordinator Office number [phone] Mobile number [phone]  Main [facility] number [phone] Fax number [phone]

## 2017-12-26 NOTE — Patient Outreach (Signed)
[facility]) Care Management  12/26/2017  Rebecca [name] 08-26-1932 [id number]   Incoming call from patient stating that [facility] sent her a letter that suggested that she send the letter to her insurance company. I informed her that she could call her insurance first to see if they would need the letter to put on file before she mails it to them. She also stated that her Humulin 70/30 had been shipped to her providers office last Friday and that she just needs to go pick it up. Informed her that [employer] had shipped enough medication to last until the end of the year and that we would have to reapply for assistance again next year.  Will route note to [facility] RPh Nunzio Cobbs for case closure.  Suzan Slick Ernesta Amble [facility] Care Management [phone]

## 2017-12-27 ENCOUNTER — Other Ambulatory Visit: Payer: Self-pay | Admitting: *Deleted

## 2017-12-27 DIAGNOSIS — R69 Illness, unspecified: Secondary | ICD-10-CM | POA: Diagnosis not present

## 2017-12-27 NOTE — Patient Outreach (Signed)
[facility]) Care Management  12/27/2017  Rebecca [name] October 26, 1932 [id number]   Call placed to member to follow up on current health status.  She report she is doing well, blood sugars remain controlled.  Will go to pick up her new shoes next week.  She expresses concern regarding being denied for food stamps.  She retrieves letter and reads it while on the phone.  As she read the letter in its entirety, she realizes she was actually approved.  She expresses gratitude for the help of [facility].  Denies any concerns at this time, advised to contact with questions.  Will follow up within the next month.  If remain stable, will consider transition to health coach for disease management.  [facility] CM Care Plan Problem One     Most Recent Value  Care Plan Problem One  Risk for fall related to defored foot as evidenced by recent falls   Role Documenting the Problem One  Care Management [place] for Problem One  Active  [facility] Long Term Goal   Member will have diabetic/support shoes within the next 2 months  [facility] Long Term Goal Start Date  12/02/17  [facility] for Problem One Long Term Goal  Confirmed with member that shoes are almost ready, has appointment scheduled for fitting next week.  Advised of importance of keeping and attending appointment.  [facility] CM Short Term Goal #1   Member will report no falls within the next 4 weeks  [facility] CM Short Term Goal #1 Start Date  12/02/17  [facility] CM Short Term Goal #1 Met Date  12/27/17  [facility] CM Short Term Goal #2   Member will report plan for life alert system within the next 4 weeks  [facility] CM Short Term Goal #2 Start Date  12/02/17  [facility] CM Short Term Goal #2 Met Date  -- [Not met]     Valente David, RN, MSN [place] Manager [phone]

## 2017-12-30 ENCOUNTER — Telehealth: Payer: Self-pay | Admitting: Pharmacist

## 2017-12-30 DIAGNOSIS — E1142 Type 2 diabetes mellitus with diabetic polyneuropathy: Secondary | ICD-10-CM | POA: Diagnosis not present

## 2017-12-30 DIAGNOSIS — M21962 Unspecified acquired deformity of left lower leg: Secondary | ICD-10-CM | POA: Diagnosis not present

## 2017-12-30 NOTE — Telephone Encounter (Signed)
-----   Message from Ashley N Coleman, CPhT sent at 12/26/2017  3:20 PM EDT ----- Case closure 

## 2017-12-30 NOTE — Telephone Encounter (Signed)
Rebecca [name] is calling from [facility] and has not received the order or the office notes about he patient left ankle brace. Please advise.  CB# [phone] Fax# [phone]

## 2017-12-30 NOTE — Patient Outreach (Signed)
[facility]) Care Management  12/30/2017  Rebecca [name] November 19, 1932 [id number]   Patient received insulin at no cost from [facility]. As such, her pharmacy episode will be closed.  Notify [facility] Team member.  Beecher Mcardle, PharmD, BCACP [facility] Clinical Pharmacist [phone]

## 2017-12-30 NOTE — Telephone Encounter (Signed)
-----   Message from Regan Rakers, CPhT sent at 12/26/2017  3:20 PM EDT ----- Case closure

## 2017-12-30 NOTE — Telephone Encounter (Signed)
Rx and office notes have been refaxed.

## 2017-12-31 ENCOUNTER — Ambulatory Visit: Payer: Medicare HMO | Admitting: Pharmacy Technician

## 2018-01-01 ENCOUNTER — Telehealth: Payer: Self-pay

## 2018-01-01 NOTE — Telephone Encounter (Signed)
-----   Message from Wanda Plump, RN sent at 01/01/2018  8:05 AM EDT ----- Rebecca [name] called and stated that through [facility]  4 bottles of insulin have been delivered to [facility] for her to pick-up. This is coming from [place] assistance program. Have you received the insulin? Ms. Stopper states she is running low on insulin and is getting worried. Thanks!

## 2018-01-01 NOTE — Telephone Encounter (Signed)
We have the insulin. I have called pt to let her know it is ready for pick up.

## 2018-01-03 ENCOUNTER — Ambulatory Visit: Payer: Self-pay | Admitting: Pharmacist

## 2018-01-13 DIAGNOSIS — M21961 Unspecified acquired deformity of right lower leg: Secondary | ICD-10-CM | POA: Diagnosis not present

## 2018-01-13 DIAGNOSIS — M21962 Unspecified acquired deformity of left lower leg: Secondary | ICD-10-CM | POA: Diagnosis not present

## 2018-01-18 ENCOUNTER — Other Ambulatory Visit: Payer: Self-pay | Admitting: Internal Medicine

## 2018-01-21 ENCOUNTER — Other Ambulatory Visit: Payer: Self-pay | Admitting: *Deleted

## 2018-01-21 NOTE — Patient Outreach (Signed)
[facility]) Care Management  01/21/2018  NESREEN ALBANO 31-Dec-1932 [id number]   Call placed to follow up on current health status.  She report she has her new diabetic shoes, walking and stability has improved.  Denies any recent falls.  Denies any further needs from this care manager, agrees to transition to health coach for disease management.  Will notify primary MD.  [facility] CM Care Plan Problem One     Most Recent Value  Care Plan Problem One  Risk for fall related to defored foot as evidenced by recent falls   Role Documenting the Problem One  Care Management [place] for Problem One  Not Active  [facility] Long Term Goal   Member will have diabetic/support shoes within the next 2 months  [facility] Long Term Goal Start Date  12/02/17  [facility] Long Term Goal Met Date  01/21/18  [facility] CM Short Term Goal #2   Member will report plan for life alert system within the next 4 weeks  [facility] CM Short Term Goal #2 Start Date  12/02/17  [facility] CM Short Term Goal #2 Met Date  -- [Not met]     Valente David, RN, MSN [place] Manager [phone]

## 2018-01-27 ENCOUNTER — Ambulatory Visit (INDEPENDENT_AMBULATORY_CARE_PROVIDER_SITE_OTHER): Payer: Medicare HMO

## 2018-01-27 DIAGNOSIS — Z23 Encounter for immunization: Secondary | ICD-10-CM

## 2018-01-29 ENCOUNTER — Encounter: Payer: Self-pay | Admitting: *Deleted

## 2018-01-29 NOTE — Progress Notes (Signed)
This encounter was created in error - please disregard.

## 2018-02-03 ENCOUNTER — Other Ambulatory Visit: Payer: Self-pay | Admitting: Internal Medicine

## 2018-02-03 DIAGNOSIS — R69 Illness, unspecified: Secondary | ICD-10-CM | POA: Diagnosis not present

## 2018-02-13 DIAGNOSIS — R69 Illness, unspecified: Secondary | ICD-10-CM | POA: Diagnosis not present

## 2018-02-19 ENCOUNTER — Encounter: Payer: Self-pay | Admitting: Pharmacy Technician

## 2018-02-20 ENCOUNTER — Other Ambulatory Visit: Payer: Self-pay | Admitting: Pharmacy Technician

## 2018-02-20 NOTE — Patient Outreach (Signed)
[facility]N) Care Management  02/20/2018  Rebecca Hashimotoarthenia W Portier 12-May-1932 [id number]    Received 2020 patient assistance referral from [facility]Ph Katina Boyrd.  Prepared patient portion to be mailed. Faxed provider portion to Dr. Cheryll CockayneStacy Burns.  Will followup with patient in 7-10 business days to confirm receipt of application.  Lanai Conlee P. Brigette Hopfer, CPhT [facility] Care Management [phone]

## 2018-02-21 ENCOUNTER — Other Ambulatory Visit: Payer: Self-pay | Admitting: *Deleted

## 2018-02-21 NOTE — Patient Outreach (Signed)
[facility]) Care Management  02/21/2018  Rebecca [name] Oct 29, 1932 [id number]   RN Health Coach Introduction Call   Outreach Attempt:  Successful telephone outreach to patient for introduction call and possible initial telephone assessment.  HIPAA verified with patient.  RN Health Coach introduced self and role and primary Health Coach Rebecca [name].  Patient verbally agrees to Disease Management outreaches.  Patient preferring telephone call back to complete initial assessment.  Plan:  RN Health Coach will place patient on Health Coach Rebecca [name] schedule for initial telephone assessment in the month of January.   Rhae LernerFarrah Brionne Mertz RN [facility] Care Management  RN Health Coach [phone] [email or web address]

## 2018-02-24 ENCOUNTER — Other Ambulatory Visit: Payer: Self-pay | Admitting: Pharmacy Technician

## 2018-02-24 NOTE — Patient Outreach (Signed)
[facility]) Care Management  02/24/2018  Rebecca [name] 03/07/1933 [id number]  Incoming call received from patient, HIPAA identifiers obtained.  Patient had questions regarding her [name] patient assistance application for Humulin 70/30. Discussed the application process and answered questions posed by the patient. Patient verbalized understanding. Patient informs that she will place in the mail in the next few days.  Will followup with patient in 10-14 business days if applications have not been received back from patient.  Rebecca [name], CPhT [facility] Care Management [phone]) [phone]

## 2018-03-07 ENCOUNTER — Ambulatory Visit (INDEPENDENT_AMBULATORY_CARE_PROVIDER_SITE_OTHER): Payer: Medicare HMO | Admitting: Family

## 2018-03-07 ENCOUNTER — Encounter: Payer: Self-pay | Admitting: Family

## 2018-03-07 ENCOUNTER — Other Ambulatory Visit: Payer: Self-pay | Admitting: Pharmacy Technician

## 2018-03-07 VITALS — BP 140/60 | HR 73 | Temp 98.2°F | Ht 63.0 in | Wt 207.1 lb

## 2018-03-07 DIAGNOSIS — J209 Acute bronchitis, unspecified: Secondary | ICD-10-CM

## 2018-03-07 MED ORDER — BENZONATATE 100 MG PO CAPS: 100.0000 mg | ORAL_CAPSULE | Freq: Three times a day (TID) | ORAL | 0 refills | Status: DC | PRN

## 2018-03-07 MED ORDER — AZITHROMYCIN 250 MG PO TABS: ORAL_TABLET | ORAL | 0 refills | Status: DC

## 2018-03-07 NOTE — Patient Outreach (Signed)
[facility]) Care Management  03/07/2018  Elease Hashimotoarthenia W Brunet [birth year] [id number]   Successful outgoing call placed to patient in regards to [facility] application for Humulin 70/30. HIPPA identifiers verified.  Patient states she placed application in mail before the holidays. Informed patient that our mail has to go to hospital first and then to us.  Will followup in 5-7 business days if applications still have not been received.  Kevork Joyce P. Denica Web, CPhT [facility] Care Management [phone]

## 2018-03-07 NOTE — Progress Notes (Signed)
Rebecca [name] is a 84 y.o. female with the following history as recorded in EpicCare:  Patient Active Problem List   Diagnosis Date Noted  . Pes planus 12/01/2017  . Foot deformity, acquired, left 11/04/2017  . Degenerative disc disease, lumbar 08/01/2016  . Glaucoma 07/23/2016  . Numbness 04/23/2016  . Chronic pain of right knee 12/28/2015  . Lower back pain 10/24/2015  . Pain in both feet 10/24/2015  . Abnormality of gait 04/01/2013  . Posterior tibial tendon dysfunction 04/01/2013  . Diabetes ([place]) 04/01/2013  . Ankle pain, left 10/20/2010  . UNSPECIFIED VITAMIN D DEFICIENCY 11/08/2008  . EDEMA- LOCALIZED 11/08/2008  . Sickle-cell trait ([place]) 01/01/2008  . Esophageal reflux 09/02/2007  . GOITER, MULTINODULAR 11/26/2006  . Coronary atherosclerosis 11/26/2006  . HIATAL HERNIA 11/26/2006  . FIBROCYSTIC BREAST DISEASE 07/31/2006  . HYPERCHOLESTEROLEMIA 05/27/2006  . Essential hypertension 05/27/2006    Current Outpatient Medications  Medication Sig Dispense Refill  . amLODipine (NORVASC) 10 MG tablet TAKE 1 TABLET BY MOUTH ONCE DAILY 90 tablet 2  . Ascorbic Acid (VITAMIN C) 500 MG tablet Take 500 mg by mouth 2 (two) times daily.      Marland Kitchen aspirin 81 MG tablet Take 81 mg by mouth daily.      Marland Kitchen atorvastatin (LIPITOR) 10 MG tablet Take 1 tablet (10 mg total) by mouth daily. Annual appt due in Feb must see provider for future refills 90 tablet 0  . Blood Glucose Monitoring Suppl (ONE TOUCH ULTRA 2) w/Device KIT Use to check blood sugars twice a day Dx e11.9 1 each 0  . calcium carbonate (OS-CAL) 600 MG TABS Take 600 mg by mouth daily.     Marland Kitchen Cod Liver Oil 1000 MG CAPS Take 1,000 mg by mouth daily. During the winter    . DORZOLAMIDE HCL-TIMOLOL MAL OP Place 1 drop into the right eye 2 (two) times daily.    . hydrochlorothiazide (MICROZIDE) 12.5 MG capsule Take 1 capsule (12.5 mg total) by mouth daily as needed. 90 capsule 1  . insulin NPH-regular Human (HUMULIN 70/30) (70-30) 100  UNIT/ML injection Inject 15 units in the morning and 7-10 units in the evening 10 mL 3  . Isopropyl Alcohol (PHARMACIST CHOICE ALCOHOL) 70 % MISC Use with Glucose meter to check sugars daily DX E11.42 100 each 1  . isosorbide mononitrate (IMDUR) 30 MG 24 hr tablet TAKE 1 TABLET BY MOUTH ONCE DAILY 90 tablet 3  . latanoprost (XALATAN) 0.005 % ophthalmic solution 1 drop at bedtime.    . lidocaine (LIDODERM) 5 % APPLY 1 PATCH TOPICALLY ONCE DAILY(REMOVE AND DISCARD PATCH WITHIN 12 HOURS OR AS DIRECTED BY MD) 30 patch 1  . meclizine (ANTIVERT) 25 MG tablet Take 1/2 to 1 tab three times a day as needed 15 tablet 0  . Multiple Vitamin (MULTIVITAMIN) tablet Take 1 tablet by mouth daily. Prn    . naproxen (NAPROSYN) 500 MG tablet TAKE 1 TABLET BY MOUTH TWICE DAILY WITH A MEAL 180 tablet 1  . nitroGLYCERIN (NITROSTAT) 0.4 MG SL tablet DISSOLVE ONE TABLET UNDER TONGUE AS DIRECTED FOR CHEST PAIN 25 tablet 5  . omeprazole (PRILOSEC) 20 MG capsule TAKE 1 CAPSULE BY MOUTH IN THE MORNING 90 capsule 3  . ONE TOUCH ULTRA TEST test strip USE 1 STRIP TO CHECK GLUCOSE TWICE DAILY 200 each 5  . ONETOUCH DELICA LANCETS 58K MISC USE 1  TO CHECK GLUCOSE TWICE DAILY 200 each 1  . quinapril (ACCUPRIL) 40 MG tablet TAKE 1 TABLET  BY MOUTH ONCE DAILY 90 tablet 0  . azithromycin (ZITHROMAX) 250 MG tablet 2 tabs po qd x 1 day; 1 tablet per day x 4 days; 6 tablet 0  . benzonatate (TESSALON) 100 MG capsule Take 1 capsule (100 mg total) by mouth 3 (three) times daily as needed. 20 capsule 0   No current facility-administered medications for this visit.     Allergies: Codeine  Past Medical History:  Diagnosis Date  . Anemia, unspecified   . Ankle pain, left   . CAD (coronary artery disease)   . Esophageal reflux   . Fibrocystic breast disease   . Hiatal hernia   . Hypercholesteremia   . Hypertension   . IDDM (insulin dependent diabetes mellitus) ([place])    Type II  . Multinodular goiter   . Sickle-cell trait [facility])      Past Surgical History:  Procedure Laterality Date  . "FEMALE"  1976  . BACK SURGERY  1990  . BIOPSY THYROID     Dr.Ellison-Neg   . CORONARY ANGIOPLASTY    . FOOT SURGERY  2004  . RETINAL LASER PROCEDURE      Family History  Problem Relation Age of Onset  . Hypertension Unknown   . Lung cancer Unknown   . Heart disease Unknown   . Gout Sister   . Gout Brother   . Heart attack Father   . Heart attack Mother   . Gout Brother     Social History   Tobacco Use  . Smoking status: Never Smoker  . Smokeless tobacco: Never Used  Substance Use Topics  . Alcohol use: No    Subjective:  2 day history of cold symptoms; + cough/ congestion; cough is keeping awake at night; feels that congestion is moving into her chest- complaining of deep cough/ worried about developing pneumonia; has been taking some OTC Benadryl;     Objective:  Vitals:   03/07/18 1459  BP: 140/60  Pulse: 73  Temp: 98.2 F (36.8 C)  TempSrc: Oral  SpO2: 98%  Weight: 207 lb 1.3 oz (93.9 kg)  Height: _0  (1.6 m)    General: Well developed, well nourished, in no acute distress  Skin : Warm and dry.  Head: Normocephalic and atraumatic  Eyes: Sclera and conjunctiva clear; pupils round and reactive to light; extraocular movements intact  Ears: External normal; canals clear; tympanic membranes normal  Oropharynx: Pink, supple. No suspicious lesions  Neck: Supple without thyromegaly, adenopathy  Lungs: Respirations unlabored; coarse breath sounds noted in upper lobes CVS exam: normal rate and regular rhythm.  Neurologic: Alert and oriented; speech intact; face symmetrical; moves all extremities well; CNII-XII intact without focal deficit   Assessment:  1. Acute bronchitis, unspecified organism     Plan:  Rx for Zpak #1 take as directed; Rx for Tessalon Perles 100 mg tid; increase fluids, rest; she understands to follow-up early next week if symptoms worsen or do not improve.   No follow-ups on file.  No  orders of the defined types were placed in this encounter.   Requested Prescriptions   Signed Prescriptions Disp Refills  . azithromycin (ZITHROMAX) 250 MG tablet 6 tablet 0    Sig: 2 tabs po qd x 1 day; 1 tablet per day x 4 days;  . benzonatate (TESSALON) 100 MG capsule 20 capsule 0    Sig: Take 1 capsule (100 mg total) by mouth 3 (three) times daily as needed.

## 2018-03-11 ENCOUNTER — Other Ambulatory Visit: Payer: Self-pay | Admitting: Pharmacy Technician

## 2018-03-11 NOTE — Patient Outreach (Signed)
[facility]) Care Management  03/11/2018  Rebecca Hashimotoarthenia [name] December 31, 1932 [id number]    Received all necessary documents and signatures from both patient and provider for [name] patient assistance for Humulin 70/30.  Submitted completed application via fax to [employer].  Will followup in 10-14 business day with [name] to inquire on status of application.  Nicolis Boody P. [name], CPhT MusicianTri[facility] HealthCare Network Care Management [phone]

## 2018-03-14 ENCOUNTER — Other Ambulatory Visit: Payer: Self-pay | Admitting: *Deleted

## 2018-03-14 NOTE — Patient Outreach (Signed)
Telephone call to initiate initial pt assessment for health coach interactions. Pt did not answer her home and I left a message requesting a return call to either myself or her assigned Health Coach, Hughes Better, RN.  Zara Council. Burgess Estelle, MSN, [facility] Gerontological Nurse Practitioner [facility] Care Management [phone]

## 2018-03-24 ENCOUNTER — Other Ambulatory Visit: Payer: Self-pay | Admitting: Pharmacy Technician

## 2018-03-24 ENCOUNTER — Other Ambulatory Visit: Payer: Self-pay | Admitting: Internal Medicine

## 2018-03-24 NOTE — Patient Outreach (Signed)
[facility]) Care Management  03/24/2018  Rebecca [name] 10-Nov-1932 [id number]    Care coordination call placed to [facility] in regards to patient's medication assistance application for Humulin 70/30.  Spoke to [place] who said the application was missing the proof of income but upon further evaluation Marchelle Folks found the missing information and resent the application back through for processing. Marchelle Folks suggested to try back in a few days to see if a determination had been made.  Will followup with [name] in 3-5 business days to see if a determinationn has been made.  Khye Hochstetler P. Harless Molinari, CPhT Musician Care Management [phone]

## 2018-03-27 ENCOUNTER — Other Ambulatory Visit: Payer: Self-pay | Admitting: Pharmacy Technician

## 2018-03-27 NOTE — Patient Outreach (Signed)
[facility]) Care Management  03/27/2018  Rebecca [name] Mar 23, 1932 [id number]    Care coordination call placed to [name] in regards to patient's medication assistance application for Humulin 70/30.  Spoke to Rebecca [name] who said patient had been APPROVED from 03/12/2018-03/12/2019. Rebecca [name] made a request for an order to be shipped today for a 88month supply or 3 vials. Rebecca [name] said to allow 7-10 business days for the medication to arrive at the provider's office.  Will followup with patient in 10-14 business days to confirm receipt of medication.  Lonnell Chaput P. Aurelia Gras, CPhT Musician Care Management [phone]

## 2018-04-03 ENCOUNTER — Other Ambulatory Visit: Payer: Self-pay | Admitting: *Deleted

## 2018-04-03 NOTE — Patient Outreach (Signed)
[facility]) Care Management  04/03/2018   Rebecca [name] Jun 05, 1932 [id number]  [place] telephone call to patient.  [employer] compliance verified. Per patient she is doing fair. Patient stated that she had an episode of her blood sugar dropping. She did not check it right then. Patient stated she went to the bathroom then went to the kitchen and ate. Per patient she stated that she felt weak and was sweating. RN discussed with patient about putting  glucose tablets at bedside for cases like this. Per patient she she is on a very limited budget and can't afford to buy a lot of things. RN discussed how it was good that she made it to the kitchen. Patient stated she does have her diabetic shoes. Per patient she does not have a medical alert system. Patient stated that she can not afford it on her limited income. Patient stated that she is exercising walking and leg exercises. Per patient she would like to have an upright walker but has not seen one covered by the insurance yet. Patient has agreed to follow up outreach calls.   Current Medications:  Current Outpatient Medications  Medication Sig Dispense Refill  . amLODipine (NORVASC) 10 MG tablet TAKE 1 TABLET BY MOUTH ONCE DAILY 90 tablet 0  . Ascorbic Acid (VITAMIN C) 500 MG tablet Take 500 mg by mouth 2 (two) times daily.      Marland Kitchen aspirin 81 MG tablet Take 81 mg by mouth daily.      Marland Kitchen atorvastatin (LIPITOR) 10 MG tablet Take 1 tablet (10 mg total) by mouth daily. Annual appt due in Feb must see provider for future refills 90 tablet 0  . azithromycin (ZITHROMAX) 250 MG tablet 2 tabs po qd x 1 day; 1 tablet per day x 4 days; 6 tablet 0  . benzonatate (TESSALON) 100 MG capsule Take 1 capsule (100 mg total) by mouth 3 (three) times daily as needed. 20 capsule 0  . Blood Glucose Monitoring Suppl (ONE TOUCH ULTRA 2) w/Device KIT Use to check blood sugars twice a day Dx e11.9 1 each 0  . calcium carbonate (OS-CAL) 600 MG TABS  Take 600 mg by mouth daily.     Marland Kitchen Cod Liver Oil 1000 MG CAPS Take 1,000 mg by mouth daily. During the winter    . DORZOLAMIDE HCL-TIMOLOL MAL OP Place 1 drop into the right eye 2 (two) times daily.    . hydrochlorothiazide (MICROZIDE) 12.5 MG capsule Take 1 capsule (12.5 mg total) by mouth daily as needed. 90 capsule 1  . insulin NPH-regular Human (HUMULIN 70/30) (70-30) 100 UNIT/ML injection Inject 15 units in the morning and 7-10 units in the evening 10 mL 3  . Isopropyl Alcohol (PHARMACIST CHOICE ALCOHOL) 70 % MISC Use with Glucose meter to check sugars daily DX E11.42 100 each 1  . isosorbide mononitrate (IMDUR) 30 MG 24 hr tablet TAKE 1 TABLET BY MOUTH ONCE DAILY 90 tablet 3  . latanoprost (XALATAN) 0.005 % ophthalmic solution 1 drop at bedtime.    . lidocaine (LIDODERM) 5 % APPLY 1 PATCH TOPICALLY ONCE DAILY(REMOVE AND DISCARD PATCH WITHIN 12 HOURS OR AS DIRECTED BY MD) 30 patch 1  . meclizine (ANTIVERT) 25 MG tablet Take 1/2 to 1 tab three times a day as needed 15 tablet 0  . Multiple Vitamin (MULTIVITAMIN) tablet Take 1 tablet by mouth daily. Prn    . naproxen (NAPROSYN) 500 MG tablet TAKE 1 TABLET BY MOUTH TWICE DAILY WITH  A MEAL 180 tablet 1  . nitroGLYCERIN (NITROSTAT) 0.4 MG SL tablet DISSOLVE ONE TABLET UNDER TONGUE AS DIRECTED FOR CHEST PAIN 25 tablet 5  . omeprazole (PRILOSEC) 20 MG capsule TAKE 1 CAPSULE BY MOUTH IN THE MORNING 90 capsule 3  . ONE TOUCH ULTRA TEST test strip USE 1 STRIP TO CHECK GLUCOSE TWICE DAILY 200 each 5  . ONETOUCH DELICA LANCETS 90B MISC USE 1  TO CHECK GLUCOSE TWICE DAILY 200 each 1  . quinapril (ACCUPRIL) 40 MG tablet TAKE 1 TABLET BY MOUTH ONCE DAILY 90 tablet 0   No current facility-administered medications for this visit.     Functional Status:  In your present state of health, do you have any difficulty performing the following activities: 04/03/2018 12/02/2017  Hearing? N N  Vision? N Y  Difficulty concentrating or making decisions? N N  Walking  or climbing stairs? Y Y  Dressing or bathing? N Y  Doing errands, shopping? [name]  Preparing Food and eating ? Y Y  Using the Toilet? N N  In the past six months, have you accidently leaked urine? N N  Do you have problems with loss of bowel control? N N  Managing your Medications? N N  Managing your Finances? N N  Housekeeping or managing your Housekeeping? Y Y  Some recent data might be hidden    Fall/Depression Screening: Fall Risk  04/03/2018 11/18/2017 11/04/2017  Falls in the past year? 1 Yes Yes  Number falls in past yr: 1 2 or more 2 or more  Injury with Fall? 0 No No  Risk Factor Category  - High Fall Risk High Fall Risk  Risk for fall due to : History of fall(s);Impaired balance/gait;Impaired mobility Impaired mobility;Impaired balance/gait -  Follow up Falls evaluation completed Education provided;Falls prevention discussed -   PHQ 2/9 Scores 04/03/2018 11/18/2017 11/18/2017 07/23/2016 12/28/2015 10/24/2015 04/01/2013  PHQ - 2 Score 0 0 1 0 0 0 0  PHQ- 9 Score - - - - 2 - -    Assessment  Patient had episode of hypoglycemia Patient does have a routine exercise Patient does not have a medical alert Patient will benefit from [employer] telephonic outreach for education and support for diabetes self management.  Plan:  RN discussed hypoglycemia and hyperglycemia and action plan RN sent a picture sheet of hypo and hyperglycemia symptoms and action plan RN sent 2020 calendar book RN will sent a dining out menu for diabetics Referred to social worker RN sent Barrier letter and assessment to PCP RN will follow up within the month of March  [name] Kimball Management [phone]

## 2018-04-09 ENCOUNTER — Other Ambulatory Visit: Payer: Self-pay | Admitting: Pharmacy Technician

## 2018-04-09 NOTE — Patient Outreach (Signed)
[facility]) Care Management  04/09/2018  Rebecca [name] [birth year] [id number]   Successful outreach call placed to patient in regards to [name] patient assistance application for Humulin 70/30.  Spoke to patient, HIPAA identifiers verified. Inquired if patient had received a call from provider's office stating that her medication had been delivered from [facility]. Patient stated she did receive the call and has picked up the medication from the provider's office.  Informed patient had to obtain her refills. Suggested patient followup with her provider's office approximately 2 weeks before running out of medication so that the provider's office could contact [name] to authorize patient's refill. [name] requires the providers office to fill out a form and submit it to them for refill authorizations. This has to occur each time. Patient verbalized understanding.   Inquired if patient had any other questions or concerns at this time and patient stated that she did not. Gave patient my phone number again and name and advised patient to reach out if she had any additional questions or concerns or if she had any trouble obtaining her refills. Patient verbalized understanding.  Will remove myself from care team as patient assistance has been completed.  Rebecca [name], CPhT Musician Care Management [phone]

## 2018-04-10 ENCOUNTER — Other Ambulatory Visit: Payer: Self-pay

## 2018-04-10 NOTE — Patient Outreach (Signed)
[facility]) Care Management  04/10/2018  Rebecca [name] 11/29/1932 [id number]  BSW attempted to contact the patient on today's date to conduct a community resource consult. Unfortunately, today's call was unsuccessful. BSW left the patient a HIPAA compliant voice message requesting a return call.   Plan: BSW will mail the patient an unsuccessful outreach letter. BSW will attempt the patient again within the next four business days.  Bevelyn Ngo, BSW, CDP [facility] [phone]

## 2018-04-14 ENCOUNTER — Other Ambulatory Visit: Payer: Self-pay

## 2018-04-14 NOTE — Patient Outreach (Signed)
[facility]) Care Management  04/14/2018  ANNALISA TRESNER 30-Jul-1932 [id number]  Successful outreach to the patient, HIPAA identifiers confirmed. BSW introduced self to the patient and the reason for today's call indicating the patients [facility] RN Health Coach placed a referral to this BSW for assistance with obtaining a walker and life alert. The patient reports she has seen an ad for an upright walker and would like to have one. The patient feels this walker would help her posture. Unfortunately, the patient is unable to afford this walker and does not feel Medicare would pay for it. BSW reviewed web-site details for "UPWalker" and noted a cost greater than $600. Unfortunately, BSW is unable to locate information on this web-site indicating if Medicare assists with the cost or not. The patient reports she does not feel it is covered.  The patient is able to report she does currently have a walker but she does not feel it is beneficial with her posture and gait. BSW noted an upcomming appointment to see her primary care provider and encouraged the patient to discuss posture and gait concerns. BSW further explained that if the patients provider felt she could benefit from in home therapy, the therapist may be able to adjust her current walker or recommend other options that would better serve the patients needs. The patient stated understanding.  The patient reports she would like to have a life alert but is unable to afford a monthly cost at this time. The patient reports her current insurance to be [employer] which does not have an over the counter catalog to assist with this piece of equipment. Unfortunately, BSW is not aware of any free programs to assist the patient. The patient does not have a cell phone but reports when she is home she keeps her cordless phone with her at all times. The patient states that when she is able to afford a monthly cost she will plan to obtain  a life alert.  BSW to perform a discipline closure as no other resource needs have been identified. The patient is in agreement with this plan.  Bevelyn Ngo, BSW, CDP [facility] [phone]

## 2018-04-15 ENCOUNTER — Ambulatory Visit: Payer: Self-pay

## 2018-04-25 ENCOUNTER — Other Ambulatory Visit: Payer: Self-pay | Admitting: Internal Medicine

## 2018-05-03 NOTE — Progress Notes (Signed)
Subjective:    Patient ID: Rebecca [name], female    DOB: 07/07/1932, 85 y.o.   MRN: [id number]  HPI The patient is here for follow up.  CAD, Hypertension: She is taking her medication daily. She is compliant with a low sodium diet.  She denies chest pain, palpitations, shortness of breath and regular headaches. She is not exercising regularly.       Hyperlipidemia: She is taking her medication daily. She is compliant with a low fat/cholesterol diet. She is not exercising regularly. She denies myalgias.   GERD:  She is taking her medication daily as prescribed.  She denies any GERD symptoms and feels her GERD is well controlled.   Diabetes: She is taking her medication daily as prescribed. She is compliant with a diabetic diet. She is not exercising regularly. Needs a new glucometer.  She checks her feet daily and denies foot lesions. She is up-to-date with an ophthalmology examination.   Feet deformities:  She has chronic pain and difficulty walking.  She uses ankle braces and has diabetic shoes.    Gait abnormality:  She uses a walker now and would like to get a stand up walker.  She has fallen with her current walker because she has to lean over.  She has knee OA, feet deformities and chronic lower back pain.  She tries to move around but is not exercising.     Medications and allergies reviewed with patient and updated if appropriate.  Patient Active Problem List   Diagnosis Date Noted  . Pes planus 12/01/2017  . Foot deformity, acquired, left 11/04/2017  . Degenerative disc disease, lumbar 08/01/2016  . Glaucoma 07/23/2016  . Numbness 04/23/2016  . Chronic pain of right knee 12/28/2015  . Lower back pain 10/24/2015  . Pain in both feet 10/24/2015  . Abnormality of gait 04/01/2013  . Posterior tibial tendon dysfunction 04/01/2013  . Diabetes ([place]) 04/01/2013  . Ankle pain, left 10/20/2010  . UNSPECIFIED VITAMIN D DEFICIENCY 11/08/2008  . EDEMA- LOCALIZED 11/08/2008    . Sickle-cell trait ([place]) 01/01/2008  . Esophageal reflux 09/02/2007  . GOITER, MULTINODULAR 11/26/2006  . Coronary atherosclerosis 11/26/2006  . HIATAL HERNIA 11/26/2006  . FIBROCYSTIC BREAST DISEASE 07/31/2006  . HYPERCHOLESTEROLEMIA 05/27/2006  . Essential hypertension 05/27/2006    Current Outpatient Medications on File Prior to Visit  Medication Sig Dispense Refill  . Ascorbic Acid (VITAMIN C) 500 MG tablet Take 500 mg by mouth 2 (two) times daily.      Marland Kitchen aspirin 81 MG tablet Take 81 mg by mouth daily.      . Blood Glucose Monitoring Suppl (ONE TOUCH ULTRA 2) w/Device KIT Use to check blood sugars twice a day Dx e11.9 1 each 0  . calcium carbonate (OS-CAL) 600 MG TABS Take 600 mg by mouth daily.     Marland Kitchen Cod Liver Oil 1000 MG CAPS Take 1,000 mg by mouth daily. During the winter    . DORZOLAMIDE HCL-TIMOLOL MAL OP Place 1 drop into the right eye 2 (two) times daily.    . insulin NPH-regular Human (HUMULIN 70/30) (70-30) 100 UNIT/ML injection Inject 15 units in the morning and 7-10 units in the evening 10 mL 3  . Isopropyl Alcohol (PHARMACIST CHOICE ALCOHOL) 70 % MISC Use with Glucose meter to check sugars daily DX E11.42 100 each 1  . isosorbide mononitrate (IMDUR) 30 MG 24 hr tablet TAKE 1 TABLET BY MOUTH ONCE DAILY 90 tablet 3  . latanoprost (XALATAN) 0.005 %  ophthalmic solution 1 drop at bedtime.    . lidocaine (LIDODERM) 5 % APPLY 1 PATCH TOPICALLY ONCE DAILY(REMOVE AND DISCARD PATCH WITHIN 12 HOURS OR AS DIRECTED BY MD) 30 patch 1  . meclizine (ANTIVERT) 25 MG tablet Take 1/2 to 1 tab three times a day as needed 15 tablet 0  . Multiple Vitamin (MULTIVITAMIN) tablet Take 1 tablet by mouth daily. Prn    . naproxen (NAPROSYN) 500 MG tablet TAKE 1 TABLET BY MOUTH TWICE DAILY WITH A MEAL 180 tablet 1  . nitroGLYCERIN (NITROSTAT) 0.4 MG SL tablet DISSOLVE ONE TABLET UNDER TONGUE AS DIRECTED FOR CHEST PAIN 25 tablet 5  . omeprazole (PRILOSEC) 20 MG capsule TAKE 1 CAPSULE BY MOUTH IN THE  MORNING 90 capsule 3   No current facility-administered medications on file prior to visit.     Past Medical History:  Diagnosis Date  . Anemia, unspecified   . Ankle pain, left   . CAD (coronary artery disease)   . Esophageal reflux   . Fibrocystic breast disease   . Hiatal hernia   . Hypercholesteremia   . Hypertension   . IDDM (insulin dependent diabetes mellitus) ([place])    Type II  . Multinodular goiter   . Sickle-cell trait [facility])     Past Surgical History:  Procedure Laterality Date  . "FEMALE"  1976  . BACK SURGERY  1990  . BIOPSY THYROID     Dr.Ellison-Neg   . CORONARY ANGIOPLASTY    . FOOT SURGERY  2004  . RETINAL LASER PROCEDURE      Social History   Socioeconomic History  . Marital status: Widowed    Spouse name: Not on file  . Number of children: 1  . Years of education: Not on file  . Highest education level: Not on file  Occupational History  . Not on file  Social Needs  . Financial resource strain: Very hard  . Food insecurity:    Worry: Often true    Inability: Often true  . Transportation needs:    Medical: Yes    Non-medical: Yes  Tobacco Use  . Smoking status: Never Smoker  . Smokeless tobacco: Never Used  Substance and Sexual Activity  . Alcohol use: No  . Drug use: No  . Sexual activity: Never  Lifestyle  . Physical activity:    Days per week: 0 days    Minutes per session: 0 min  . Stress: Only a little  Relationships  . Social connections:    Talks on phone: More than three times a week    Gets together: More than three times a week    Attends religious service: More than 4 times per year    Active member of club or organization: Yes    Attends meetings of clubs or organizations: More than 4 times per year    Relationship status: Widowed  Other Topics Concern  . Not on file  Social History Narrative   Widowed   Gets regular exercise   Supportive church network   Receives daily meals on wheels   [employer] monthly grocery  delivery       Family History  Problem Relation Age of Onset  . Hypertension Unknown   . Lung cancer Unknown   . Heart disease Unknown   . Gout Sister   . Gout Brother   . Heart attack Father   . Heart attack Mother   . Gout Brother     Review of Systems  Constitutional:  Negative for chills and fever.  Respiratory: Negative for cough, shortness of breath and wheezing.   Cardiovascular: Positive for leg swelling (occ, mild). Negative for chest pain and palpitations.  Musculoskeletal: Positive for arthralgias and back pain.  Neurological: Negative for light-headedness and headaches.       Objective:   Vitals:   05/05/18 0821  BP: (!) 142/60  Pulse: (!) 56  Resp: 14  Temp: 98.3 F (36.8 C)  SpO2: 99%   BP Readings from Last 3 Encounters:  05/05/18 (!) 142/60  03/07/18 140/60  12/02/17 128/62   Wt Readings from Last 3 Encounters:  05/05/18 204 lb (92.5 kg)  03/07/18 207 lb 1.3 oz (93.9 kg)  12/02/17 200 lb (90.7 kg)   Body mass index is 36.14 kg/m.   Physical Exam    Constitutional: Appears well-developed and well-nourished. No distress.  HENT:  Head: Normocephalic and atraumatic.  Neck: Neck supple. No tracheal deviation present. No thyromegaly present.  No cervical lymphadenopathy Cardiovascular: Normal rate, regular rhythm and normal heart sounds.   No murmur heard. No carotid bruit .  No edema Pulmonary/Chest: Effort normal and breath sounds normal. No respiratory distress. No has no wheezes. No rales.  Skin: Skin is warm and dry. Not diaphoretic.  Psychiatric: Normal mood and affect. Behavior is normal.      Assessment & Plan:    See Problem List for Assessment and Plan of chronic medical problems.

## 2018-05-03 NOTE — Patient Instructions (Addendum)

## 2018-05-05 ENCOUNTER — Ambulatory Visit (INDEPENDENT_AMBULATORY_CARE_PROVIDER_SITE_OTHER): Payer: HMO | Admitting: Internal Medicine

## 2018-05-05 ENCOUNTER — Other Ambulatory Visit (INDEPENDENT_AMBULATORY_CARE_PROVIDER_SITE_OTHER): Payer: HMO

## 2018-05-05 ENCOUNTER — Encounter: Payer: Self-pay | Admitting: Internal Medicine

## 2018-05-05 VITALS — BP 142/60 | HR 56 | Temp 98.3°F | Resp 14 | Ht 63.0 in | Wt 204.0 lb

## 2018-05-05 DIAGNOSIS — E1142 Type 2 diabetes mellitus with diabetic polyneuropathy: Secondary | ICD-10-CM

## 2018-05-05 DIAGNOSIS — I2510 Atherosclerotic heart disease of native coronary artery without angina pectoris: Secondary | ICD-10-CM

## 2018-05-05 DIAGNOSIS — R269 Unspecified abnormalities of gait and mobility: Secondary | ICD-10-CM

## 2018-05-05 DIAGNOSIS — M21962 Unspecified acquired deformity of left lower leg: Secondary | ICD-10-CM

## 2018-05-05 DIAGNOSIS — I10 Essential (primary) hypertension: Secondary | ICD-10-CM

## 2018-05-05 DIAGNOSIS — K219 Gastro-esophageal reflux disease without esophagitis: Secondary | ICD-10-CM

## 2018-05-05 DIAGNOSIS — Z794 Long term (current) use of insulin: Secondary | ICD-10-CM | POA: Diagnosis not present

## 2018-05-05 DIAGNOSIS — E7800 Pure hypercholesterolemia, unspecified: Secondary | ICD-10-CM | POA: Diagnosis not present

## 2018-05-05 LAB — LIPID PANEL
Cholesterol: 185 mg/dL (ref 0–200)
HDL: 84.3 mg/dL (ref >39.00)
LDL Cholesterol: 85 mg/dL (ref 0–99)
NonHDL: 100.4
Total CHOL/HDL Ratio: 2
Triglycerides: 77 mg/dL (ref 0.0–149.0)
VLDL: 15.4 mg/dL (ref 0.0–40.0)

## 2018-05-05 LAB — CBC WITH DIFFERENTIAL/PLATELET
Basophils Absolute: 0 10*3/uL (ref 0.0–0.1)
Basophils Relative: 0.1 % (ref 0.0–3.0)
Eosinophils Absolute: 0.3 10*3/uL (ref 0.0–0.7)
Eosinophils Relative: 6.3 % — ABNORMAL HIGH (ref 0.0–5.0)
HCT: 37.3 % (ref 36.0–46.0)
Hemoglobin: 12.3 g/dL (ref 12.0–15.0)
Lymphocytes Relative: 39.1 % (ref 12.0–46.0)
Lymphs Abs: 1.7 10*3/uL (ref 0.7–4.0)
MCHC: 32.9 g/dL (ref 30.0–36.0)
MCV: 80.5 fl (ref 78.0–100.0)
Monocytes Absolute: 0.5 10*3/uL (ref 0.1–1.0)
Monocytes Relative: 12.4 % — ABNORMAL HIGH (ref 3.0–12.0)
Neutro Abs: 1.8 10*3/uL (ref 1.4–7.7)
Neutrophils Relative %: 42.1 % — ABNORMAL LOW (ref 43.0–77.0)
Platelets: 204 10*3/uL (ref 150.0–400.0)
RBC: 4.63 Mil/uL (ref 3.87–5.11)
RDW: 16.3 % — ABNORMAL HIGH (ref 11.5–15.5)
WBC: 4.3 10*3/uL (ref 4.0–10.5)

## 2018-05-05 LAB — COMPREHENSIVE METABOLIC PANEL
ALT: 14 U/L (ref 0–35)
AST: 18 U/L (ref 0–37)
Albumin: 4 g/dL (ref 3.5–5.2)
Alkaline Phosphatase: 52 U/L (ref 39–117)
BUN: 31 mg/dL — ABNORMAL HIGH (ref 6–23)
CO2: 28 mEq/L (ref 19–32)
Calcium: 9.6 mg/dL (ref 8.4–10.5)
Chloride: 105 mEq/L (ref 96–112)
Creatinine, Ser: 1.21 mg/dL — ABNORMAL HIGH (ref 0.40–1.20)
GFR: 51.31 mL/min — ABNORMAL LOW (ref >60.00)
Glucose, Bld: 106 mg/dL — ABNORMAL HIGH (ref 70–99)
Potassium: 4.6 mEq/L (ref 3.5–5.1)
Sodium: 141 mEq/L (ref 135–145)
Total Bilirubin: 0.4 mg/dL (ref 0.2–1.2)
Total Protein: 6.8 g/dL (ref 6.0–8.3)

## 2018-05-05 LAB — HEMOGLOBIN A1C: Hgb A1c MFr Bld: 7.8 % — ABNORMAL HIGH (ref 4.6–6.5)

## 2018-05-05 MED ORDER — AMLODIPINE BESYLATE 10 MG PO TABS: 10.0000 mg | ORAL_TABLET | Freq: Every day | ORAL | 1 refills | Status: DC

## 2018-05-05 MED ORDER — BLOOD GLUCOSE MONITOR KIT: PACK | 0 refills | Status: DC

## 2018-05-05 MED ORDER — ATORVASTATIN CALCIUM 10 MG PO TABS: 10.0000 mg | ORAL_TABLET | Freq: Every day | ORAL | 1 refills | Status: DC

## 2018-05-05 MED ORDER — HYDROCHLOROTHIAZIDE 12.5 MG PO CAPS: 12.5000 mg | ORAL_CAPSULE | Freq: Every day | ORAL | 1 refills | Status: DC | PRN

## 2018-05-05 MED ORDER — NEEDLES & SYRINGES MISC: 3 refills | Status: DC

## 2018-05-05 MED ORDER — QUINAPRIL HCL 40 MG PO TABS: 40.0000 mg | ORAL_TABLET | Freq: Every day | ORAL | 1 refills | Status: DC

## 2018-05-05 NOTE — Assessment & Plan Note (Signed)
She is wearing her brace and states that does help with her walking Also wearing diabetic shoes Needs to use a walker to ambulate, but standard walker is no longer sufficient-prescription given for standard walker, which is medically necessary

## 2018-05-05 NOTE — Assessment & Plan Note (Signed)
Check lipid panel  Continue daily statin Regular exercise and healthy diet encouraged  

## 2018-05-05 NOTE — Assessment & Plan Note (Signed)
Gait abnormality/difficulty with standard walker and requesting a stand-up walker Gait difficulty related to knee arthritis, feet deformities and chronic lower back pain Has fallen using her standard walker and would benefit from a stand-up walker

## 2018-05-05 NOTE — Assessment & Plan Note (Signed)
BP well controlled Current regimen effective and well tolerated Continue current medications at current doses cmp  

## 2018-05-05 NOTE — Assessment & Plan Note (Signed)
Denies any chest pain, palpitations or shortness of breath Continue current medications We will schedule a follow-up with cardiology for routine follow-up CMP, lipid panel, CBC

## 2018-05-05 NOTE — Assessment & Plan Note (Addendum)
Eating fairly well, by limited by funds Exercising minimally Check a1c Prescription for new glucometer given

## 2018-05-05 NOTE — Assessment & Plan Note (Signed)
GERD controlled Continue daily medication  

## 2018-05-23 ENCOUNTER — Other Ambulatory Visit: Payer: Self-pay

## 2018-05-23 NOTE — Patient Outreach (Signed)
  [facility]) Care Management Chronic Special Needs Program  05/23/2018  Name: Rebecca [name] DOB: 1932/10/09  MRN: [id number]  Rebecca [name] is enrolled in a chronic special needs plan for Diabetes. Client called with no answer No answer and HIPAA compliant message left. Plan for 2nd outreach call in 3-4 days Chronic care management coordinator will attempt outreach in 3-4 days.   Dudley Major RN, Maximiano Coss, CDE Chronic Care Management Coordinator [facility] Care Management [phone]

## 2018-05-27 ENCOUNTER — Other Ambulatory Visit: Payer: Self-pay

## 2018-05-27 ENCOUNTER — Other Ambulatory Visit: Payer: Self-pay | Admitting: *Deleted

## 2018-05-27 ENCOUNTER — Telehealth: Payer: Self-pay | Admitting: Internal Medicine

## 2018-05-27 DIAGNOSIS — M21962 Unspecified acquired deformity of left lower leg: Secondary | ICD-10-CM

## 2018-05-27 DIAGNOSIS — R269 Unspecified abnormalities of gait and mobility: Secondary | ICD-10-CM

## 2018-05-27 NOTE — Patient Outreach (Signed)
[facility]) Care Management  05/27/2018  Lilybeth Ernsberger [facility] [birth year] [id number]   RN Health Coach discipline closure. Patient is now in the CSNP program.  Gean Maidens BSN RN [facility] Care Management [phone]

## 2018-05-27 NOTE — Patient Outreach (Signed)
[facility]) Care Management Chronic Special Needs Program  05/27/2018  Name: Rebecca [name] DOB: [birth year]  MRN: [id number]  Ms. Rebecca [name] is enrolled in a chronic special needs plan for Diabetes. Chronic Care Management Coordinator telephoned client to review health risk assessment and to develop individualized care plan.  Introduced the chronic care management program, importance of client participation, and taking their care plan to all provider appointments and inpatient facilities.  Reviewed the transition of care process and possible referral to community care management.  Subjective: Client states she is trying to get her blood sugar in better control.  States that her sugars usually range 90-105 in the morning and 85-156 at supper time.  States she was on a pain patch that helped with her arthritis but she no longer takes as she can not afford it.   Goals Addressed            This Visit's Progress   .  Diabetes Patient stated goal to get hemoglobinA1C to 7% in the next 6 months (pt-stated)      . Client understands the importance of follow-up with providers by attending scheduled visits      . Client will report abillity to obtain Medications within next 3 months      . Client will report no fall or injuries in the next 6 months.      . Client will report no worsening of symptoms of Atrial Fibrillation within the next 6 months      . Client will report no worsening of symptoms related to heart disease within the next 6 months       . Client will use Assistive Devices as needed and verbalize understanding of device use      . Client will verbalize knowledge of self management of Hypertension as evidences by BP reading of 140/90 or less; or as defined by provider      . HEMOGLOBIN A1C < 7.0       Diabetes self management actions:  Glucose monitoring per provider recommendations  Perform Quality checks on blood meter  Eat Healthy  Check feet  daily  Visit provider every 3-6 months as directed  Hbg A1C level every 3-6 months.  Eye Exam yearly    . Maintain timely refills of diabetic medication as prescribed within the year .      Marland Kitchen Obtain annual  Lipid Profile, LDL-C      . Obtain Annual Eye (retinal)  Exam       . Obtain Annual Foot Exam      . Obtain annual screen for micro albuminuria (urine) , nephropathy (kidney problems)      . Obtain Hemoglobin A1C at least 2 times per year      . Visit Primary Care Provider or Endocrinologist at least 2 times per year        Client is not meeting diabetes self management goal of hemoglobin A1C of <7% with last reading of 7.8%.  Client agreeable with referral to pharmacy for assistance with medications for cost of pain patch which she is currently not using due to cost and is on > 8 medications. She is currently working with pharmacy assistance to get her insulin Client has previously worked with socal work for resources and  Now has food stamps.  Reports no falls in the last 6 months.  Plan to notify Health coach of transition to Chronic Special Needs Plan RNCM  Plan:  Send successful outreach letter  with a copy of their individualized care plan, Send individual care plan to provider and Send educational material-HTN, Heart disease, fall safety, DM, and chair exercises  Chronic care management coordination will outreach in:  6 Months  Will refer client to:  Pharmacy   Dudley Major RN, [facility], CDE Chronic Care Management Coordinator [facility] Care Management [phone]

## 2018-05-27 NOTE — Telephone Encounter (Signed)
Copied from CRM [id number]. Topic: Quick Communication - See Telephone Encounter >> May 27, 2018  1:05 PM Arlyss Gandy, NT wrote: CRM for notification. See Telephone encounter for: 05/27/18. Glenna with with [employer] calling to see where the order for a walker was sent to. Please advise. CB#: [phone]

## 2018-05-28 ENCOUNTER — Telehealth: Payer: Self-pay | Admitting: Pharmacist

## 2018-05-28 NOTE — Telephone Encounter (Signed)
Rebecca [name] is calling back. Please advise.

## 2018-05-28 NOTE — Patient Outreach (Signed)
[facility]) Care Management  [place]   05/28/2018  Rebecca [name] 1932-08-11 [id number]  Reason for referral: Medication Assistance, Medication Review  Referral source: [facility] RN Current insurance: [employer] C-SNP  Outreach:  Successful telephone call with patient.  HIPAA identifiers verified.   Subjective:  Patient is an 85 year old female with multiple medical conditions including but not limited to:  Glaucoma, hiatal hernia, type 2 diabetes, hyperlipidemia, and vitamin D deficiency.  Patient is a member of the [place] program. [place] is currently helping her obtain Humulin 70/30 through [employer].  However, the patient mentioned to her CSNP nurse that she was having difficulty affording lidocaine patches.  Objective: Lab Results  Component Value Date   CREATININE 1.21 (H) 05/05/2018   CREATININE 1.22 (H) 11/04/2017   CREATININE 1.06 05/06/2017    Lab Results  Component Value Date   HGBA1C 7.8 (H) 05/05/2018    Lipid Panel     Component Value Date/Time   CHOL 185 05/05/2018 0923   TRIG 77.0 05/05/2018 0923   HDL 84.30 05/05/2018 0923   CHOLHDL 2 05/05/2018 0923   VLDL 15.4 05/05/2018 0923   LDLCALC 85 05/05/2018 0923    BP Readings from Last 3 Encounters:  05/05/18 (!) 142/60  03/07/18 140/60  12/02/17 128/62    Allergies  Allergen Reactions  . Codeine     "Makes me drunk"    Medications Reviewed Today    Reviewed by Elayne Guerin, [facility] (Pharmacist) on 05/28/18 at 1312  Med List Status: <None>  Medication Order Taking? Sig Documenting Provider Last Dose Status Informant  amLODipine (NORVASC) 10 MG tablet [id number] Yes Take 1 tablet (10 mg total) by mouth daily. Binnie Rail, MD Taking Active   Ascorbic Acid (VITAMIN C) 500 MG tablet [id number] Yes Take 500 mg by mouth 2 (two) times daily.   [provider] Taking Active   aspirin 81 MG tablet [id number] Yes Take 81 mg by  mouth daily.   [provider] Taking Active   atorvastatin (LIPITOR) 10 MG tablet [id number] Yes Take 1 tablet (10 mg total) by mouth daily at 6 PM. Burns, [name], MD Taking Active   Blood Glucose Monitoring Suppl (ONE TOUCH ULTRA 2) w/Device KIT [id number] Yes Use to check blood sugars twice a day Dx e11.9 Binnie Rail, MD Taking Active   calcium carbonate (OS-CAL) 600 MG TABS [id number] Yes Take 600 mg by mouth daily.  [provider] Taking Active   Cod Liver Oil 1000 MG CAPS [id number] Yes Take 1,000 mg by mouth daily. During the winter [provider] Taking Active   DORZOLAMIDE HCL-TIMOLOL MAL OP [id number] Yes Place 1 drop into the right eye 2 (two) times daily. [provider] Taking Active   hydrochlorothiazide (MICROZIDE) 12.5 MG capsule [id number] Yes Take 1 capsule (12.5 mg total) by mouth daily as needed. Binnie Rail, MD Taking Active   insulin NPH-regular Human (HUMULIN 70/30) (70-30) 100 UNIT/ML injection [id number] Yes Inject 15 units in the morning and 7-10 units in the evening Burns, [name], MD Taking Active   Isopropyl Alcohol (PHARMACIST CHOICE ALCOHOL) 70 % MISC [id number] Yes Use with Glucose meter to check sugars daily DX E11.42 Binnie Rail, MD Taking Active   isosorbide mononitrate (IMDUR) 30 MG 24 hr tablet [id number] Yes TAKE 1 TABLET BY MOUTH ONCE DAILY Burns, [name], MD Taking Active   latanoprost (XALATAN) 0.005 % ophthalmic solution  [id number] Yes 1 drop at bedtime. [provider] Taking Active   lidocaine (LIDODERM) 5 % [id number] Yes APPLY 1 PATCH TOPICALLY ONCE DAILY(REMOVE AND DISCARD PATCH WITHIN 12 HOURS OR AS DIRECTED BY MD) Lyndal Pulley, DO Taking Active   meclizine (ANTIVERT) 25 MG tablet [id number] Yes Take 1/2 to 1 tab three times a day as needed Hendricks Limes, MD Taking Active            Med Note Catha Gosselin Dec 28, 2015 11:41 AM) PRN  Multiple Vitamin (MULTIVITAMIN) tablet [id number] Yes Take 1  tablet by mouth daily. Prn [provider] Taking Active   naproxen (NAPROSYN) 500 MG tablet [id number] Yes TAKE 1 TABLET BY MOUTH TWICE DAILY WITH A MEAL Lyndal Pulley, DO Taking Active   Needles & Syringes [place] [id number] Yes Use twice daily to inject insulin. Binnie Rail, MD Taking Active   nitroGLYCERIN (NITROSTAT) 0.4 MG SL tablet [id number] Yes DISSOLVE ONE TABLET UNDER TONGUE AS DIRECTED FOR CHEST PAIN Larey Dresser, MD Taking Active            Med Note Catha Gosselin Dec 28, 2015 11:41 AM) PRN, emergency chest pain rescue  omeprazole (PRILOSEC) 20 MG capsule [id number] Yes TAKE 1 CAPSULE BY MOUTH IN THE MORNING Burns, [name], MD Taking Active   quinapril (ACCUPRIL) 40 MG tablet [id number] Yes Take 1 tablet (40 mg total) by mouth daily. Binnie Rail, MD Taking Active           Assessment:  Drugs sorted by system:  Cardiovascular: Amlodipine, Aspirin, Atorvastatin, Hydrochlorothiazide, Isosorbide Mononitrate, Nitroglycerin, Quinapril  Gastrointestinal: Meclizine, Omeprazole Omeprazole,   Topical: Lidocaine Patches,   Pain:  Naproxen  Vitamins/Minerals/Supplements: Vitamin D, Vitamin C, Calcium Carbonate, Cod Liver Oil, Multiple Vitamin  Miscellaneous: Dorzolamide-timolol, Latanoprost,  Medication Review Findings:  . Naproxen- with ACE inhibitor may cause renal insufficiency as well as place stress on patient's gastric mucosa (she is taking omeprazole)   Medication Assistance Findings:  Medication assistance needs identified.   Extra Help:  Already receiving Partial Extra Help Low Income Subsidy  Patient Assistance Programs: Humulin 70/30  made by [place] requirement met: Yes o Out-of-pocket prescription expenditure met:   Not Applicable - Patient has met application requirements to apply for this patient assistance program.    Patient is already approved to receive Humulin 70/30 from [place].  Lidocaine patches are non-formulary on the patient's insurance. They are only covered for post herpetic neuralgia.  Patient was educated on some OTC Lidocaine patches:  Icy Hot Lidocaine Patches and Aspercreme Lidocaine Patches.   Plan: Will follow-up in 3 months as the patient did not have any further questions and is part of the CSNP program.

## 2018-05-28 NOTE — Telephone Encounter (Signed)
Spoke with Rebecca [name] and gave her information on where the script for walker was sent which was [facility].

## 2018-06-02 ENCOUNTER — Other Ambulatory Visit: Payer: Self-pay | Admitting: *Deleted

## 2018-06-06 NOTE — Telephone Encounter (Signed)
Glenna from [employer] called and left voicemail on [facility] mailbox.  Dan Humphreys is not covered under insurance through [facility] and she needs this sent to [facility]. Frazier Butt is requesting a call back at [phone].

## 2018-06-06 NOTE — Addendum Note (Signed)
Addended by: Mercer Pod E on: 06/06/2018 02:32 PM   Modules accepted: Orders

## 2018-06-06 NOTE — Telephone Encounter (Signed)
Tried calling Glenna back. She was not available. Left message with Lequita Halt who answered the phone stating that I will fax over an rx for a walker for patient. I asked her to get Glenna to call back if something else was needed.

## 2018-06-06 NOTE — Telephone Encounter (Signed)
Lequita Halt called back and would like the order faxed to  [facility] fax number [phone]

## 2018-06-06 NOTE — Telephone Encounter (Signed)
Information faxed

## 2018-06-09 ENCOUNTER — Telehealth: Payer: Self-pay | Admitting: Internal Medicine

## 2018-06-10 NOTE — Telephone Encounter (Signed)
Pt requesting this medication, not on her med list

## 2018-06-12 ENCOUNTER — Telehealth: Payer: Self-pay | Admitting: Internal Medicine

## 2018-06-12 ENCOUNTER — Other Ambulatory Visit: Payer: Self-pay | Admitting: Pharmacy Technician

## 2018-06-12 NOTE — Patient Outreach (Signed)
[facility]) Care Management  06/12/2018  Rebecca [name] [birth year] [id number]   Incoming call received from patient, HIPAA identifiers verified.  Patient was calling to see if I could assist her in obtaining a medication call Diclofenac from Dr. Katrinka Blazing. Patient informed she left a message earlier in the week but had not heard back. Informed patient that the provider's office is busy and that they were working on the request. Patient requested if I could please make a courtesy call to the provider's office.  Called Dr. Michaelle Copas office and spoke to the receptionist. The receptionist informed that they had received the request but since the patient had not been on it before, they were having to wait for the provider's approval. She informed she would reach out to the patient.  Rebecca [name] P. Erine Phenix, CPhT Musician Care Management [phone]

## 2018-06-12 NOTE — Telephone Encounter (Signed)
Noreene Larsson from [facility] called stating pt reached out to her to find out whey doctor hasn't refilled her medication yet. Pt can be reached at [phone]

## 2018-06-12 NOTE — Telephone Encounter (Signed)
Pt called back and l/m confirm the name of medication diclofenac.

## 2018-06-12 NOTE — Telephone Encounter (Signed)
Left mess for patient to call back to clarify what refill patient is requesting. Diclofenac has been denied by PCP.

## 2018-06-13 NOTE — Telephone Encounter (Signed)
Per Dr. Lawerance Bach I spoke with pt and advised that she can not take diclofenac with naproxen. I also let pt know that diclofenac can affect the kidneys as well. Pt stated that her first message was intended for Dr. Katrinka Blazing and she prefers for Dr. Katrinka Blazing to contact her himself if at all possible. Pt is aware he is not back until Monday. I advised someone will contact her.

## 2018-06-13 NOTE — Telephone Encounter (Signed)
Called pt to get more information about why she is needing diclofenac oral medication. She states that she is having problems with her arthritis and her sister takes a diclofenac pill that helps with her pain. She is not currently using a lidocaine patch she states that her insurance would not cover this medication. Please advise.

## 2018-06-13 NOTE — Telephone Encounter (Signed)
She can try OTC lidocaine patches or gel - the patches are 4% (rx is 5%) but they may help.  The diclofenac pills are not ideal because they are bad for her kidneys and her last blood work showed slightly decreased kidney function.  She can try tylenol or other topical arthritis medications

## 2018-06-17 NOTE — Telephone Encounter (Signed)
Glenna calling back and states that fax has not received.can we refax Fax#[phone]

## 2018-06-19 NOTE — Telephone Encounter (Signed)
lmovm for pt to return call.  

## 2018-06-23 NOTE — Telephone Encounter (Signed)
I have not seen her for 1 year.  Would need at least a virtual visit to discuss.

## 2018-06-23 NOTE — Telephone Encounter (Signed)
Spoke to pt. She is requesting that Dr. Katrinka Blazing prescribe her something for "stiffness".

## 2018-06-24 ENCOUNTER — Telehealth: Payer: Self-pay | Admitting: Internal Medicine

## 2018-06-24 NOTE — Telephone Encounter (Signed)
Called pt and explained to her that the information that has been requested was faxed over to [employer] on 2 separate occasions. We have not received any calls other what is documented in her chart which have all been responded to. I called [employer] and they stated they could not find patients name in their system. I have refaxed to a different number that was given [phone]. Spoke with pt to let her know what was going on. She expressed understanding.

## 2018-06-24 NOTE — Telephone Encounter (Signed)
Patient called and stated that she is so upset and so hurt by the fact that her provider is not sending over the forms for her walker. She states that [employer] has called six times and gotten no response. Patient goes on the states that she can not believe Dr Lawerance Bach does not care about her enough to do this. She would like a call back about this to know why she is unable to get a walker. Call patient at [phone] (home)

## 2018-06-30 DIAGNOSIS — M21962 Unspecified acquired deformity of left lower leg: Secondary | ICD-10-CM | POA: Diagnosis not present

## 2018-06-30 DIAGNOSIS — R269 Unspecified abnormalities of gait and mobility: Secondary | ICD-10-CM | POA: Diagnosis not present

## 2018-06-30 DIAGNOSIS — E7800 Pure hypercholesterolemia, unspecified: Secondary | ICD-10-CM | POA: Diagnosis not present

## 2018-06-30 DIAGNOSIS — Z794 Long term (current) use of insulin: Secondary | ICD-10-CM | POA: Diagnosis not present

## 2018-07-18 ENCOUNTER — Telehealth: Payer: Self-pay | Admitting: Internal Medicine

## 2018-07-18 MED ORDER — GABAPENTIN 100 MG PO CAPS: ORAL_CAPSULE | ORAL | 1 refills | Status: DC

## 2018-07-18 NOTE — Telephone Encounter (Signed)
Copied from CRM [id number]. Topic: Quick Communication - Rx Refill/Question >> Jul 18, 2018  8:23 AM Crist Infante wrote: Medication: gabapentin (NEURONTIN) 100 MG capsule   Pt states she used to take this for hand and feet numbness.  Pt states this comes along with her diabetes. Pt states  the numbness had gotten better, but is back again.  Pt states she not due for a visit until Sept, and Dr Lawerance Bach knows about this ongoing issue,.  However, this med not refilled since 2018. Pt would like this sent to:  [facility] [address] ([address] [place] - [address] [phone] (Phone) [phone] (Fax)

## 2018-07-18 NOTE — Telephone Encounter (Signed)
Please advise 

## 2018-07-18 NOTE — Telephone Encounter (Signed)
rx sent

## 2018-08-03 ENCOUNTER — Other Ambulatory Visit: Payer: Self-pay | Admitting: Internal Medicine

## 2018-08-11 ENCOUNTER — Other Ambulatory Visit: Payer: Self-pay | Admitting: Internal Medicine

## 2018-08-25 ENCOUNTER — Other Ambulatory Visit: Payer: Self-pay | Admitting: Pharmacy Technician

## 2018-08-25 NOTE — Patient Outreach (Signed)
[facility]) Care Management  08/25/2018  Rebecca [name] 1932/07/09 [id number]    Incoming call received from patient in regards to [place] application for Humulin 70/30.  Patient was calling to inform that she needed a refill of her medication. Informed patient that she would need to call [name] at [phone] to order her refill. Informed patient that they will deliver to her home but that she would need to specify that request. Patient verbalized understanding and said she would call them now.  Will end inbasked to Dr. Marzetta Board Burns's office to request that a prescription be sent into [facility].  Will followup with [name] and/or [facility] in 3-5 business days to inquire on status of shipment.  Rebecca [name], [place] Management [phone]

## 2018-08-26 ENCOUNTER — Other Ambulatory Visit: Payer: Self-pay

## 2018-08-26 MED ORDER — HUMULIN 70/30 (70-30) 100 UNIT/ML ~~LOC~~ SUSP: SUBCUTANEOUS | 3 refills | Status: DC

## 2018-08-28 ENCOUNTER — Encounter: Payer: Self-pay | Admitting: Pharmacist

## 2018-08-28 ENCOUNTER — Other Ambulatory Visit: Payer: Self-pay | Admitting: Pharmacist

## 2018-08-28 NOTE — Patient Outreach (Signed)
[facility]) Care Management  [place]   08/28/2018  Rebecca [name] 02-05-1933 [id number]  Reason for referral: Medication Assistance, Medication Review  Referral source: [employer] Care Manager with [facility] Current insurance: [employer] C-SNP  PMHx includes but not limited to:   Glaucoma, hiatal hernia, type 2 diabetes, hyperlipidemia, and vitamin D deficiency. Patient is a member of the [employer] program.   Outreach:  Successful telephone call with patient.  HIPAA identifiers verified.   Subjective:  Patient reports feeling well with the exception of having right knee pain/stiffness.  Patient ranked the pain 4/10.  Objective: The ASCVD Risk score Mikey Bussing DC Jr., et al., 2013) failed to calculate for the following reasons:   The 2013 ASCVD risk score is only valid for ages 4 to 20  Lab Results  Component Value Date   CREATININE 1.21 (H) 05/05/2018   CREATININE 1.22 (H) 11/04/2017   CREATININE 1.06 05/06/2017    Lab Results  Component Value Date   HGBA1C 7.8 (H) 05/05/2018    Lipid Panel     Component Value Date/Time   CHOL 185 05/05/2018 0923   TRIG 77.0 05/05/2018 0923   HDL 84.30 05/05/2018 0923   CHOLHDL 2 05/05/2018 0923   VLDL 15.4 05/05/2018 0923   LDLCALC 85 05/05/2018 0923    BP Readings from Last 3 Encounters:  05/05/18 (!) 142/60  03/07/18 140/60  12/02/17 128/62    Allergies  Allergen Reactions  . Codeine     "Makes me drunk"    Medications Reviewed Today    Reviewed by Elayne Guerin, [facility] (Pharmacist) on 08/28/18 at 1127  Med List Status: <None>  Medication Order Taking? Sig Documenting Provider Last Dose Status Informant  amLODipine (NORVASC) 10 MG tablet [id number] Yes Take 1 tablet (10 mg total) by mouth daily. Binnie Rail, MD Taking Active   Ascorbic Acid (VITAMIN C) 500 MG tablet [id number] Yes Take 500 mg by mouth 2 (two) times daily.   [provider] Taking Active   aspirin  81 MG tablet [id number] Yes Take 81 mg by mouth daily.   [provider] Taking Active   atorvastatin (LIPITOR) 10 MG tablet [id number] Yes Take 1 tablet (10 mg total) by mouth daily at 6 PM. Burns, [name], MD Taking Active   Blood Glucose Monitoring Suppl (ONE TOUCH ULTRA 2) w/Device KIT [id number] Yes Use to check blood sugars twice a day Dx e11.9 Binnie Rail, MD Taking Active   calcium carbonate (OS-CAL) 600 MG TABS [id number] Yes Take 600 mg by mouth daily.  [provider] Taking Active   Cod Liver Oil 1000 MG CAPS [id number] Yes Take 1,000 mg by mouth daily. During the winter [provider] Taking Active   DORZOLAMIDE HCL-TIMOLOL MAL OP [id number] Yes Place 1 drop into the right eye 2 (two) times daily. [provider] Taking Active   gabapentin (NEURONTIN) 100 MG capsule [id number] Yes TAKE 1 CAPSULE BY MOUTH ONCE DAILY AT BEDTIME Burns, [name], MD Taking Active   hydrochlorothiazide (MICROZIDE) 12.5 MG capsule [id number] Yes Take 1 capsule (12.5 mg total) by mouth daily as needed. Binnie Rail, MD Taking Active   insulin NPH-regular Human (HUMULIN 70/30) (70-30) 100 UNIT/ML injection [id number] Yes Inject 15 units in the morning and 7-10 units in the evening Burns, [name], MD Taking Active   Isopropyl Alcohol (PHARMACIST CHOICE ALCOHOL) 70 % MISC [id number] Yes Use with Glucose meter to check sugars daily  DX Y80.16 Binnie Rail, MD Taking Active   isosorbide mononitrate (IMDUR) 30 MG 24 hr tablet [id number] Yes Take 1 tablet by mouth once daily Burns, [name], MD Taking Active   latanoprost (XALATAN) 0.005 % ophthalmic solution [id number] Yes 1 drop at bedtime. [provider] Taking Active   meclizine (ANTIVERT) 25 MG tablet [id number] Yes Take 1/2 to 1 tab three times a day as needed Hendricks Limes, MD Taking Active            Med Note Catha Gosselin Dec 28, 2015 11:41 AM) PRN  Multiple Vitamin (MULTIVITAMIN) tablet [id number] Yes Take 1  tablet by mouth daily. Prn [provider] Taking Active   [place] [id number] Yes Use twice daily to inject insulin. Binnie Rail, MD Taking Active   nitroGLYCERIN (NITROSTAT) 0.4 MG SL tablet [id number] Yes DISSOLVE ONE TABLET UNDER TONGUE AS DIRECTED FOR CHEST PAIN Larey Dresser, MD Taking Active            Med Note Catha Gosselin Dec 28, 2015 11:41 AM) PRN, emergency chest pain rescue  omeprazole (PRILOSEC) 20 MG capsule [id number] Yes Take 1 capsule by mouth in the morning Burns, [name], MD Taking Active   quinapril (ACCUPRIL) 40 MG tablet [id number] Yes Take 1 tablet (40 mg total) by mouth daily. Binnie Rail, MD Taking Active   Trolamine Salicylate (ASPERCREME) 10 % LOTN [id number] Yes Use twice daily as needed for right knee pain. [provider]  Active   Med List Note Elayne Guerin, [facility] 05/28/18 1330):            Assessment: Drugs sorted by system:  Neurological: Gabapentin  Cardiovascular: Amlodipine, Aspirin, Atorvastatin, Hydrochlorothiazide, Isosorbide Mononitrate, Nitroglycerin, Quinapril  Endocrine: Humulin 70/30   Gastrointestinal: Meclizine, Omeprazole   Topical: Aspercreme   Vitamins/Minerals/Supplements: Vitamin D, Vitamin C, Calcium Carbonate, Cod Liver Oil, Multiple Vitamin  Miscellaneous: Dorzolamide-timolol, Latanoprost,  Medication Review Findings:  . HgA1c-7.8% . Knee pain-patient said she used to rub diclofenac gel on her knee and it was helpful but she had not had it in a while. Review of her chart shows it was discontinued in August of 2018.  A note will be sent to the patient's provider about restarting diclofenac gel or another treatment for the patient's knee stiffness.     Medication Assistance Findings Patient is already receiving her insulin (Humulin 70/30) at no cost from [employer]. All other medications are generic.  Plan: . Will route note to patient's PCP about her knee pain..   . Will follow-up in 5-7 business days.Marland Kitchen

## 2018-08-29 ENCOUNTER — Other Ambulatory Visit: Payer: Self-pay | Admitting: Pharmacy Technician

## 2018-08-29 NOTE — Patient Outreach (Signed)
[facility]) Care Management  08/29/2018  Rebecca [name] 01/03/1933 [id number]  Care coordination call placed to [place] in regards to patient's application for Humulin 70/30.  Spoke to [facility] who informed, after calling over to [employer], that the prescription was received from the provider's office. She informed they would be reaching out to patient next week to set up shipment.  Kazandra Forstrom P. Akeelah Seppala, [place] Management [phone]

## 2018-09-03 ENCOUNTER — Other Ambulatory Visit: Payer: Self-pay | Admitting: Pharmacy Technician

## 2018-09-03 ENCOUNTER — Telehealth: Payer: Self-pay | Admitting: Internal Medicine

## 2018-09-03 NOTE — Telephone Encounter (Signed)
Would you like an OV for this

## 2018-09-03 NOTE — Patient Outreach (Signed)
[place] [facility]) Care Management  09/03/2018  Rebecca [name] [birth year] [id number]  ADDENDUM  Successful outreach call to patient in regards to [place] application for Humulin 70/40.  Spoke to patient, HIPAA identifiers verified.  Patient confirmed receiving her insulin yesterday. She inquired if we had called her provider's office about her stiffness. Informed patient that [place] sent a message to her provider's office. Informed patient to followup with the provider's office concerning stiffness. Patient informed she would followup with them in a few days if she doesn't hear from them.  Confirmed patient had our name and number for future.  Vi Whitesel P. Sherol Sabas, [place] Management [phone]

## 2018-09-03 NOTE — Telephone Encounter (Signed)
General/Other - advice/medication  The patient would like Dr. Quay Burow or her nurse to call her in ome medication for stiffness.

## 2018-09-03 NOTE — Patient Outreach (Signed)
[place] [facility]) Care Management  09/03/2018  Rebecca [name] May 22, 1932 [id number]  Care coordination call placed to [place] in regards to patient's application for Humulin 70/30. Spoke to [place] who informed medication was delivered on 09/02/2018.  Will followup with patient.  Shantanu Strauch P. Eunique Balik, [place] Management [phone]

## 2018-09-03 NOTE — Telephone Encounter (Signed)
Appointment made

## 2018-09-03 NOTE — Telephone Encounter (Signed)
Needs an appointment.

## 2018-09-05 ENCOUNTER — Other Ambulatory Visit: Payer: Self-pay | Admitting: Pharmacist

## 2018-09-05 ENCOUNTER — Other Ambulatory Visit: Payer: Self-pay

## 2018-09-05 NOTE — Patient Outreach (Signed)
[name]) Care Management  09/05/2018  Rebecca [name] 12-02-1932 [id number]   Patient was called to follow up on her knee pain.  HIPAA identifiers were obtained.  Patient said her knee is still in pain.  She describe the pain as 10/10 stiffness.  She has an appointment to see Dr. Quay Burow on Monday.  She said they told her they could work her in today but due to her knee stiffness, she said she did not feel comfortable to drive. Her grandson can take her to the appointment on Monday.  She expressed concern about transportation and said she reached out to [place], Daneen Schick but was told she was on vacation.  Patient said she has an appointment at the eye doctor next week.    Plan: Call [facility] Social Worker, Geographical information systems officer for consultation. Call patient back on Tuesday to follow up after her MD appointment.  Elayne Guerin, PharmD, [place] Clinical Pharmacist [phone]

## 2018-09-05 NOTE — Patient Outreach (Signed)
[facility]) Care Management  09/05/2018  Rebecca [name] 1932-03-17 [id number]   Patient contacted [facility] office on 09/04/18 requesting transportation assistance for appointment with Dr. Quay Burow on 09/08/18.  In-basket message received today to contact her about this. Patient reports that her grandson is going to take her to this appointment.  She has an upcoming eye appointment on 09/15/18 and is not sure that she will have transportation to that appointment.  Patient reports that she typically drivers herself but is not able to right now "becauase of my knee".  BSW and patient discussed SCAT services.  BSW offered to complete/submit application for her but she declined.  BSW agreed to contact her prior to eye appointment to determine if she is still in need of transportation .  Ronn Melena, BSW Social Worker [phone]

## 2018-09-07 NOTE — Progress Notes (Signed)
Subjective:    Patient ID: Rebecca [name], female    DOB: April 24, 1932, 85 y.o.   MRN: [id number]  HPI The patient is here for an acute visit.   Right knee pain:  Her right knee has been stiff for about two months.  She has more stiffness but only mild pain.  When she stands she has to stand in place for a few minutes before she can start walking.  She has tried Aspercreme and it helps only a little.  She has not tried anything.  She has been diagnosed with knee arthritis in the past.    She needs new shoes.  She has diabetes and a severe foot deformity and her shoes are wearing out due to her deformity.  She has chronic foot pain.    Medications and allergies reviewed with patient and updated if appropriate.  Patient Active Problem List   Diagnosis Date Noted  . Knee osteoarthritis 09/08/2018  . Pes planus 12/01/2017  . Foot deformity, acquired, left 11/04/2017  . Degenerative disc disease, lumbar 08/01/2016  . Glaucoma 07/23/2016  . Numbness 04/23/2016  . Chronic pain of right knee 12/28/2015  . Lower back pain 10/24/2015  . Pain in both feet 10/24/2015  . Abnormality of gait 04/01/2013  . Posterior tibial tendon dysfunction 04/01/2013  . Diabetes ([place]) 04/01/2013  . Ankle pain, left 10/20/2010  . UNSPECIFIED VITAMIN D DEFICIENCY 11/08/2008  . EDEMA- LOCALIZED 11/08/2008  . Sickle-cell trait ([place]) 01/01/2008  . Esophageal reflux 09/02/2007  . GOITER, MULTINODULAR 11/26/2006  . Coronary atherosclerosis 11/26/2006  . HIATAL HERNIA 11/26/2006  . FIBROCYSTIC BREAST DISEASE 07/31/2006  . HYPERCHOLESTEROLEMIA 05/27/2006  . Essential hypertension 05/27/2006    Current Outpatient Medications on File Prior to Visit  Medication Sig Dispense Refill  . amLODipine (NORVASC) 10 MG tablet Take 1 tablet (10 mg total) by mouth daily. 90 tablet 1  . Ascorbic Acid (VITAMIN C) 500 MG tablet Take 500 mg by mouth 2 (two) times daily.      Marland Kitchen aspirin 81 MG tablet Take 81 mg by mouth  daily.      Marland Kitchen atorvastatin (LIPITOR) 10 MG tablet Take 1 tablet (10 mg total) by mouth daily at 6 PM. 90 tablet 1  . Blood Glucose Monitoring Suppl (ONE TOUCH ULTRA 2) w/Device KIT Use to check blood sugars twice a day Dx e11.9 1 each 0  . calcium carbonate (OS-CAL) 600 MG TABS Take 600 mg by mouth daily.     Marland Kitchen Cod Liver Oil 1000 MG CAPS Take 1,000 mg by mouth daily. During the winter    . DORZOLAMIDE HCL-TIMOLOL MAL OP Place 1 drop into the right eye 2 (two) times daily.    Marland Kitchen gabapentin (NEURONTIN) 100 MG capsule TAKE 1 CAPSULE BY MOUTH ONCE DAILY AT BEDTIME 90 capsule 1  . hydrochlorothiazide (MICROZIDE) 12.5 MG capsule Take 1 capsule (12.5 mg total) by mouth daily as needed. 90 capsule 1  . insulin NPH-regular Human (HUMULIN 70/30) (70-30) 100 UNIT/ML injection Inject 15 units in the morning and 7-10 units in the evening 10 mL 3  . Isopropyl Alcohol (PHARMACIST CHOICE ALCOHOL) 70 % MISC Use with Glucose meter to check sugars daily DX E11.42 100 each 1  . isosorbide mononitrate (IMDUR) 30 MG 24 hr tablet Take 1 tablet by mouth once daily 90 tablet 0  . latanoprost (XALATAN) 0.005 % ophthalmic solution 1 drop at bedtime.    . meclizine (ANTIVERT) 25 MG tablet Take 1/2 to 1  tab three times a day as needed 15 tablet 0  . Multiple Vitamin (MULTIVITAMIN) tablet Take 1 tablet by mouth daily. Prn    . Needles & Syringes MISC Use twice daily to inject insulin. 180 each 3  . nitroGLYCERIN (NITROSTAT) 0.4 MG SL tablet DISSOLVE ONE TABLET UNDER TONGUE AS DIRECTED FOR CHEST PAIN 25 tablet 5  . omeprazole (PRILOSEC) 20 MG capsule Take 1 capsule by mouth in the morning 90 capsule 0  . quinapril (ACCUPRIL) 40 MG tablet Take 1 tablet (40 mg total) by mouth daily. 90 tablet 1  . Trolamine Salicylate (ASPERCREME) 10 % LOTN Use twice daily as needed for right knee pain.     No current facility-administered medications on file prior to visit.     Past Medical History:  Diagnosis Date  . Anemia, unspecified    . Ankle pain, left   . CAD (coronary artery disease)   . Esophageal reflux   . Fibrocystic breast disease   . Hiatal hernia   . Hypercholesteremia   . Hypertension   . IDDM (insulin dependent diabetes mellitus) ([place])    Type II  . Multinodular goiter   . Sickle-cell trait [facility])     Past Surgical History:  Procedure Laterality Date  . "FEMALE"  1976  . BACK SURGERY  1990  . BIOPSY THYROID     Dr.Ellison-Neg   . CORONARY ANGIOPLASTY    . FOOT SURGERY  2004  . RETINAL LASER PROCEDURE      Social History   Socioeconomic History  . Marital status: Widowed    Spouse name: Not on file  . Number of children: 1  . Years of education: Not on file  . Highest education level: Not on file  Occupational History  . Not on file  Social Needs  . Financial resource strain: Very hard  . Food insecurity    Worry: Never true    Inability: Never true  . Transportation needs    Medical: Yes    Non-medical: Yes  Tobacco Use  . Smoking status: Never Smoker  . Smokeless tobacco: Never Used  Substance and Sexual Activity  . Alcohol use: No  . Drug use: No  . Sexual activity: Never  Lifestyle  . Physical activity    Days per week: 2 days    Minutes per session: Not on file  . Stress: Only a little  Relationships  . Social connections    Talks on phone: More than three times a week    Gets together: More than three times a week    Attends religious service: More than 4 times per year    Active member of club or organization: Yes    Attends meetings of clubs or organizations: More than 4 times per year    Relationship status: Widowed  Other Topics Concern  . Not on file  Social History Narrative   Widowed   Gets regular exercise   Supportive church network   Receives daily meals on wheels   [place] monthly grocery delivery       Family History  Problem Relation Age of Onset  . Hypertension Other   . Lung cancer Other   . Heart disease Other   . Gout Sister   . Gout  Brother   . Heart attack Father   . Heart attack Mother   . Gout Brother     Review of Systems  Constitutional: Negative for chills and fever.  Respiratory: Negative for cough, shortness  of breath and wheezing.   Musculoskeletal: Positive for arthralgias, gait problem and joint swelling.       Foot pain b/l  Skin: Negative for color change.       Objective:   Vitals:   09/08/18 1027  BP: (!) 142/64  Pulse: (!) 58  Resp: 16  Temp: 98.2 F (36.8 C)  SpO2: 97%   BP Readings from Last 3 Encounters:  09/08/18 (!) 142/64  05/05/18 (!) 142/60  03/07/18 140/60   Wt Readings from Last 3 Encounters:  09/08/18 205 lb (93 kg)  05/05/18 204 lb (92.5 kg)  03/07/18 207 lb 1.3 oz (93.9 kg)   Body mass index is 36.31 kg/m.   Physical Exam Constitutional:      General: She is not in acute distress.    Appearance: Normal appearance. She is not ill-appearing.  Musculoskeletal:        General: Swelling (right knee), tenderness (right knee across the lower portion of the knee) and deformity (b/l feet) present.     Right lower leg: Edema (1 + pitting) present.     Left lower leg: Edema (1 + pitting) present.  Skin:    General: Skin is warm and dry.  Neurological:     Mental Status: She is alert.          Diabetic Foot Exam - Simple   Simple Foot Form Diabetic Foot exam was performed with the following findings: Yes   Visual Inspection See comments: Yes Sensation Testing Intact to touch and monofilament testing bilaterally: Yes Pulse Check Posterior Tibialis and Dorsalis pulse intact bilaterally: Yes Comments Left foot - Severe pes planus, osteoid deformity left medial posterior arch and tibial tendon dysfunction, bunion Right foot - bunion, mild pes planus Mild hammer toes b/l       Assessment & Plan:    See Problem List for Assessment and Plan of chronic medical problems.

## 2018-09-08 ENCOUNTER — Encounter: Payer: Self-pay | Admitting: Internal Medicine

## 2018-09-08 ENCOUNTER — Ambulatory Visit (INDEPENDENT_AMBULATORY_CARE_PROVIDER_SITE_OTHER): Payer: HMO | Admitting: Internal Medicine

## 2018-09-08 ENCOUNTER — Other Ambulatory Visit: Payer: Self-pay

## 2018-09-08 VITALS — BP 142/64 | HR 58 | Temp 98.2°F | Resp 16 | Ht 63.0 in | Wt 205.0 lb

## 2018-09-08 DIAGNOSIS — E1142 Type 2 diabetes mellitus with diabetic polyneuropathy: Secondary | ICD-10-CM

## 2018-09-08 DIAGNOSIS — M1711 Unilateral primary osteoarthritis, right knee: Secondary | ICD-10-CM | POA: Diagnosis not present

## 2018-09-08 DIAGNOSIS — M1710 Unilateral primary osteoarthritis, unspecified knee: Secondary | ICD-10-CM | POA: Insufficient documentation

## 2018-09-08 DIAGNOSIS — Z794 Long term (current) use of insulin: Secondary | ICD-10-CM | POA: Diagnosis not present

## 2018-09-08 DIAGNOSIS — M179 Osteoarthritis of knee, unspecified: Secondary | ICD-10-CM | POA: Insufficient documentation

## 2018-09-08 MED ORDER — ALCOHOL SWABS PADS: MEDICATED_PAD | 1 refills | Status: DC

## 2018-09-08 MED ORDER — DICLOFENAC SODIUM 1.5 % TD SOLN: TRANSDERMAL | 5 refills | Status: DC

## 2018-09-08 NOTE — Assessment & Plan Note (Signed)
With deformity - severe pes planus Shoes are wearing quickly and she needs a new pair Foot exam done rx written for shoes

## 2018-09-08 NOTE — Patient Instructions (Addendum)
Try the diclofenac gel on your knee for your knee stiffness and pain.     Take tylenol - you can take 3000 mg of tylenol a day.     Try to keep active.  If you need to see orthopedics let me know.

## 2018-09-08 NOTE — Assessment & Plan Note (Signed)
Knee symptoms c/w OA - swelling, stiffness and pain Trial of diclofenac gel  Tylenol Will avoid oral nsaids due to minor CKD Deferred ortho referral Deferred PT at this time due to coronavirus

## 2018-09-09 ENCOUNTER — Other Ambulatory Visit: Payer: Self-pay | Admitting: Pharmacist

## 2018-09-09 NOTE — Patient Outreach (Signed)
[place] [facility]) Care Management  09/09/2018  Rebecca [name] 01-01-1933 [id number]   Patient was called to follow up on her knee stiffness. HIPAA identifiers were obtained.  Patient saw her PCP 09/08/2018 and was re-started on diclofenac gel. She was advised to take acetaminophen.  Ortho referral may be considered in the future.   Patient reported the diclofenac was on order and would be ready at the local pharmacy around 3pm today.  She said her son was going to pick it up for her today.  She also reported that she did not have any acetaminophen. She said would ask her son and grandson about picking up some acetaminophen but could not be sure if they would because they both have busy lives.   Pharmacy delivery was discussed but the patient was very hesitant.  She said she would think about it.  Plan: Call patient back in 1 month to see if the diclofenac gel is effective.  Elayne Guerin, PharmD, [place] Clinical Pharmacist [phone]

## 2018-09-11 ENCOUNTER — Other Ambulatory Visit: Payer: Self-pay

## 2018-09-11 NOTE — Patient Outreach (Signed)
[place] [facility]) Care Management  09/11/2018  KAILANI BRASS April 16, 1932 [id number]   Follow up call to patient to ensure that she has transportation to eye appointment on 09/15/18.  Patient reports that her grandson should be able to take her.   BSW provided her with contact information and asked her to call before end of business day if she is going to need transportation.  If needed, BSW will schedule transport via [place], [place] Worker [phone]

## 2018-09-15 DIAGNOSIS — Z961 Presence of intraocular lens: Secondary | ICD-10-CM | POA: Diagnosis not present

## 2018-09-15 DIAGNOSIS — Z981 Arthrodesis status: Secondary | ICD-10-CM | POA: Diagnosis not present

## 2018-09-15 DIAGNOSIS — E113593 Type 2 diabetes mellitus with proliferative diabetic retinopathy without macular edema, bilateral: Secondary | ICD-10-CM | POA: Diagnosis not present

## 2018-09-15 DIAGNOSIS — H401132 Primary open-angle glaucoma, bilateral, moderate stage: Secondary | ICD-10-CM | POA: Diagnosis not present

## 2018-09-15 LAB — HM DIABETES EYE EXAM

## 2018-09-17 ENCOUNTER — Encounter: Payer: Self-pay | Admitting: Internal Medicine

## 2018-10-01 ENCOUNTER — Telehealth: Payer: Self-pay | Admitting: Internal Medicine

## 2018-10-01 NOTE — Telephone Encounter (Signed)
A Chris from [facility] call the patient and told her Dr. Quay Burow sent in an order for a new tester for blood sugar that you don't have to prick your finger.  She did not give him any information because it's sounded shady, but she would like to know more about this tester.  Please advise.

## 2018-10-03 NOTE — Telephone Encounter (Signed)
Tried calling pt back in regards. No answer and no VM set up.

## 2018-10-08 ENCOUNTER — Other Ambulatory Visit: Payer: Self-pay | Admitting: Pharmacist

## 2018-10-08 NOTE — Patient Outreach (Signed)
[place] [facility]) Care Management  10/08/2018  Rebecca [name] [birth year] [id number]  Patient was called to follow up on her knee pain. Diclofenac topical was recently added by her provider.  Patient said her knee pain (stiffness) was 8/10 today.    She said the diclofenac has not helped her.  She is planning to go see an orthopaedist.  No medication changes.  Plan: Call patient back in 3 months for CSNP follow up.  Elayne Guerin, PharmD, [place] Clinical Pharmacist [phone]

## 2018-10-09 LAB — HM DIABETES EYE EXAM

## 2018-10-14 ENCOUNTER — Encounter: Payer: Self-pay | Admitting: Internal Medicine

## 2018-10-20 ENCOUNTER — Telehealth: Payer: Self-pay | Admitting: Internal Medicine

## 2018-10-20 NOTE — Telephone Encounter (Signed)
Spoke with patient, She states her insurance does not offer anything to help with rides.  I have given her SCAT's number ([phone]) to see if she can apply for assistance. She is going to follow up with them.

## 2018-10-20 NOTE — Telephone Encounter (Signed)
Patient is calling for advice to see if she can get transportation to her medical appointment. Patient states that she is unable to drive now. Asked patient if she is familiar with [employer]AT. Patent was not.  Please advise. Cb- [phone]

## 2018-10-27 DIAGNOSIS — H04123 Dry eye syndrome of bilateral lacrimal glands: Secondary | ICD-10-CM | POA: Diagnosis not present

## 2018-10-27 DIAGNOSIS — H401132 Primary open-angle glaucoma, bilateral, moderate stage: Secondary | ICD-10-CM | POA: Diagnosis not present

## 2018-10-30 ENCOUNTER — Other Ambulatory Visit: Payer: Self-pay | Admitting: Internal Medicine

## 2018-11-04 ENCOUNTER — Encounter (INDEPENDENT_AMBULATORY_CARE_PROVIDER_SITE_OTHER): Payer: HMO | Admitting: Ophthalmology

## 2018-11-04 ENCOUNTER — Other Ambulatory Visit: Payer: Self-pay

## 2018-11-04 DIAGNOSIS — I10 Essential (primary) hypertension: Secondary | ICD-10-CM

## 2018-11-04 DIAGNOSIS — E113593 Type 2 diabetes mellitus with proliferative diabetic retinopathy without macular edema, bilateral: Secondary | ICD-10-CM | POA: Diagnosis not present

## 2018-11-04 DIAGNOSIS — H35033 Hypertensive retinopathy, bilateral: Secondary | ICD-10-CM

## 2018-11-04 DIAGNOSIS — E11319 Type 2 diabetes mellitus with unspecified diabetic retinopathy without macular edema: Secondary | ICD-10-CM

## 2018-11-04 DIAGNOSIS — H43813 Vitreous degeneration, bilateral: Secondary | ICD-10-CM | POA: Diagnosis not present

## 2018-11-06 ENCOUNTER — Encounter: Payer: Self-pay | Admitting: Family Medicine

## 2018-11-06 ENCOUNTER — Ambulatory Visit (INDEPENDENT_AMBULATORY_CARE_PROVIDER_SITE_OTHER)
Admission: RE | Admit: 2018-11-06 | Discharge: 2018-11-06 | Disposition: A | Discharge status: Home / Self Care | Payer: HMO | Source: Ambulatory Visit | Attending: Family Medicine | Admitting: Family Medicine

## 2018-11-06 ENCOUNTER — Other Ambulatory Visit: Payer: Self-pay

## 2018-11-06 ENCOUNTER — Ambulatory Visit (INDEPENDENT_AMBULATORY_CARE_PROVIDER_SITE_OTHER): Payer: HMO | Admitting: Family Medicine

## 2018-11-06 VITALS — BP 118/56 | HR 74 | Ht 63.0 in

## 2018-11-06 DIAGNOSIS — M545 Low back pain, unspecified: Secondary | ICD-10-CM

## 2018-11-06 DIAGNOSIS — M170 Bilateral primary osteoarthritis of knee: Secondary | ICD-10-CM | POA: Diagnosis not present

## 2018-11-06 MED ORDER — GABAPENTIN 100 MG PO CAPS: 200.0000 mg | ORAL_CAPSULE | Freq: Every day | ORAL | 0 refills | Status: DC

## 2018-11-06 NOTE — Patient Instructions (Signed)
Good to see you.  Ice 20 minutes 2 times daily. Usually after activity and before bed. Home health PT Gabapentin 200 mg at night  Turmeric 500mg  daily  Tart cherry extract 1200mg  at night Vitamin D 2000 IU daily  See me again in 5-6 weeks

## 2018-11-06 NOTE — Assessment & Plan Note (Signed)
Bilateral severe arthritic changes of the knees bilaterally.  Patient does have some instability.  Patient will not wear a custom brace.  Patient will try the steroid injections and we discussed the possibility of [place] supplementation.  In addition to this we discussed likely no surgical intervention secondary to patient's comorbidities.  Patient will follow-up 4 to 8 weeks

## 2018-11-06 NOTE — Progress Notes (Signed)
[facility] [place], [place] [address] Phone: [phone] Subjective:   Rebecca [name], am serving as a scribe for Dr. Hulan Saas.   CC: bilateral knee pain and back pain   [id number]  06/07/2017 Severe in nature.  We discussed worsening symptoms and to take different medications.  Patient has been fairly noncompliant with the medications previously.  We discussed icing regimen and home exercises.  Patient will continue with conservative therapy and see me again 2-3 months  Update 11/06/2018 Rebecca [name] is a 85 y.o. female coming in with complaint of bilateral knee pain. Last seen in 2019 for back pain. Feels are back is still weak. Patient states that she was doing good for a while but pain has started to increase. Patient is having back and bilateral knee pain, right > left. Patient has fallen due to instability. Uses walker throughout the day.       Past Medical History:  Diagnosis Date  . Anemia, unspecified   . Ankle pain, left   . CAD (coronary artery disease)   . Esophageal reflux   . Fibrocystic breast disease   . Hiatal hernia   . Hypercholesteremia   . Hypertension   . IDDM (insulin dependent diabetes mellitus) ([place])    Type II  . Multinodular goiter   . Sickle-cell trait [facility])    Past Surgical History:  Procedure Laterality Date  . "FEMALE"  1976  . BACK SURGERY  1990  . BIOPSY THYROID     Dr.Ellison-Neg   . CORONARY ANGIOPLASTY    . FOOT SURGERY  2004  . RETINAL LASER PROCEDURE     Social History   Socioeconomic History  . Marital status: Widowed    Spouse name: Not on file  . Number of children: 1  . Years of education: Not on file  . Highest education level: Not on file  Occupational History  . Not on file  Social Needs  . Financial resource strain: Very hard  . Food insecurity    Worry: Never true    Inability: Never true  . Transportation needs    Medical: Yes    Non-medical: Yes   Tobacco Use  . Smoking status: Never Smoker  . Smokeless tobacco: Never Used  Substance and Sexual Activity  . Alcohol use: [name]  . Drug use: [name]  . Sexual activity: Never  Lifestyle  . Physical activity    Days per week: 2 days    Minutes per session: Not on file  . Stress: Only a little  Relationships  . Social connections    Talks on phone: More than three times a week    Gets together: More than three times a week    Attends religious service: More than 4 times per year    Active member of club or organization: Yes    Attends meetings of clubs or organizations: More than 4 times per year    Relationship status: Widowed  Other Topics Concern  . Not on file  Social History Narrative   Widowed   Gets regular exercise   Supportive church network   Receives daily meals on wheels   [employer] monthly grocery delivery      Allergies  Allergen Reactions  . Codeine     "Makes me drunk"   Family History  Problem Relation Age of Onset  . Hypertension Other   . Lung cancer Other   . Heart disease Other   .  Gout Sister   . Gout Brother   . Heart attack Father   . Heart attack Mother   . Gout Brother     Current Outpatient Medications (Endocrine & Metabolic):  .  insulin NPH-regular Human (HUMULIN 70/30) (70-30) 100 UNIT/ML injection, Inject 15 units in the morning and 7-10 units in the evening  Current Outpatient Medications (Cardiovascular):  .  amLODipine (NORVASC) 10 MG tablet, Take 1 tablet (10 mg total) by mouth daily. Marland Kitchen  atorvastatin (LIPITOR) 10 MG tablet, Take 1 tablet (10 mg total) by mouth daily at 6 PM. .  hydrochlorothiazide (MICROZIDE) 12.5 MG capsule, Take 1 capsule (12.5 mg total) by mouth daily as needed. .  isosorbide mononitrate (IMDUR) 30 MG 24 hr tablet, Take 1 tablet by mouth once daily .  nitroGLYCERIN (NITROSTAT) 0.4 MG SL tablet, DISSOLVE ONE TABLET UNDER TONGUE AS DIRECTED FOR CHEST PAIN .  quinapril (ACCUPRIL) 40 MG tablet, Take 1 tablet (40 mg  total) by mouth daily.   Current Outpatient Medications (Analgesics):  .  aspirin 81 MG tablet, Take 81 mg by mouth daily.     Current Outpatient Medications (Other):  Marland Kitchen  Alcohol Swabs PADS, Use as needed to clean puncture site. .  Ascorbic Acid (VITAMIN C) 500 MG tablet, Take 500 mg by mouth 2 (two) times daily.   .  Blood Glucose Monitoring Suppl (ONE TOUCH ULTRA 2) w/Device KIT, Use to check blood sugars twice a day Dx e11.9 .  calcium carbonate (OS-CAL) 600 MG TABS, Take 600 mg by mouth daily.  Marland Kitchen  Cod Liver Oil 1000 MG CAPS, Take 1,000 mg by mouth daily. During the winter .  Diclofenac Sodium 1.5 % SOLN, Apply to knee QID .  DORZOLAMIDE HCL-TIMOLOL MAL OP, Place 1 drop into the right eye 2 (two) times daily. Marland Kitchen  gabapentin (NEURONTIN) 100 MG capsule, TAKE 1 CAPSULE BY MOUTH ONCE DAILY AT BEDTIME .  Isopropyl Alcohol (PHARMACIST CHOICE ALCOHOL) 70 % MISC, Use with Glucose meter to check sugars daily DX E11.42 .  latanoprost (XALATAN) 0.005 % ophthalmic solution, 1 drop at bedtime. .  meclizine (ANTIVERT) 25 MG tablet, Take 1/2 to 1 tab three times a day as needed .  Multiple Vitamin (MULTIVITAMIN) tablet, Take 1 tablet by mouth daily. Prn .  Needles & Syringes MISC, Use twice daily to inject insulin. Marland Kitchen  omeprazole (PRILOSEC) 20 MG capsule, Take 1 capsule by mouth in the morning .  OneTouch Delica Lancets 93T MISC, USE   TO CHECK GLUCOSE UP TO 4 TIMES DAILY .  ONETOUCH ULTRA test strip, USE 1 STRIP TO CHECK GLUCOSE TWICE DAILY .  Trolamine Salicylate (ASPERCREME) 10 % LOTN, Use twice daily as needed for right knee pain. Marland Kitchen  gabapentin (NEURONTIN) 100 MG capsule, Take 2 capsules (200 mg total) by mouth at bedtime.    Past medical history, social, surgical and family history all reviewed in electronic medical record.  [name] pertanent information unless stated regarding to the chief complaint.   Review of Systems:  [name] headache, visual changes, nausea, vomiting, diarrhea, constipation,  dizziness, abdominal pain, skin rash, fevers, chills, night sweats, weight loss, swollen lymph nodes, body aches, joint swelling, muscle aches, chest pain, shortness of breath, mood changes.   Objective  Blood pressure (!) 118/56, pulse 74, height _0  (1.6 m), SpO2 96 %.    General: [name] apparent distress alert mood and affect normal, dressed appropriately.  HEENT: Pupils equal, extraocular movements intact  Respiratory: Patient's speak in full sentences and  does not appear short of breath  Cardiovascular: 1+ lower extremity edema, non tender, [name] erythema  Skin: Warm dry intact with [name] signs of infection or rash on extremities or on axial skeleton.  Abdomen: Soft nontender  Neuro: Cranial nerves II through XII are intact, neurovascularly intact in all extremities with 2+ DTRs and 2+ pulses.  Lymph: [name] lymphadenopathy of posterior or anterior cervical chain or axillae bilaterally.  Gait antalgic gait  MSK:  tender with limited range of motion and poor stability and symmetric strength and tone of shoulders, elbows, wrist, hip,and ankles bilaterally.  Knee: Bilateral valgus deformity noted. Large thigh to calf ratio.  Tender to palpation over medial and PF joint line.  ROM full in flexion and extension and lower leg rotation. instability with valgus force.  painful patellar compression. Patellar glide with moderate crepitus. Patellar and quadriceps tendons unremarkable. Hamstring and quadriceps strength is normal.   Back exam does have degenerative scoliosis with loss of lordosis with severe diffuse tenderness to even light palpation of the lumbar spine.  Patient is tender nearly the entire spine.  Significant tightness with straight leg test with some mild radicular symptoms down the right leg.  Patient has 4 out of 5 strength with dorsiflexion bilaterally.  After informed written and verbal consent, patient was seated on exam table. Right knee was prepped with alcohol swab and utilizing  anterolateral approach, patient's right knee space was injected with 4:1  marcaine 0.5%: Kenalog 79m/dL. Patient tolerated the procedure well without immediate complications.  After informed written and verbal consent, patient was seated on exam table. Left knee was prepped with alcohol swab and utilizing anterolateral approach, patient's left knee space was injected with 4:1  marcaine 0.5%: Kenalog 438mdL. Patient tolerated the procedure well without immediate complications.    Impression and Recommendations:     This case required medical decision making of moderate complexity. The above documentation has been reviewed and is accurate and complete Rebecca PulleyDO       Note: This dictation was prepared with Dragon dictation along with smaller phrase technology. Any transcriptional errors that result from this process are unintentional.

## 2018-11-11 ENCOUNTER — Ambulatory Visit: Payer: HMO | Admitting: Orthotics

## 2018-11-11 ENCOUNTER — Encounter: Payer: Self-pay | Admitting: Podiatry

## 2018-11-11 ENCOUNTER — Ambulatory Visit: Payer: HMO | Admitting: Podiatry

## 2018-11-11 ENCOUNTER — Other Ambulatory Visit: Payer: Self-pay

## 2018-11-11 DIAGNOSIS — E1142 Type 2 diabetes mellitus with diabetic polyneuropathy: Secondary | ICD-10-CM

## 2018-11-11 DIAGNOSIS — M2142 Flat foot [pes planus] (acquired), left foot: Secondary | ICD-10-CM | POA: Diagnosis not present

## 2018-11-11 DIAGNOSIS — M76829 Posterior tibial tendinitis, unspecified leg: Secondary | ICD-10-CM

## 2018-11-11 DIAGNOSIS — M2141 Flat foot [pes planus] (acquired), right foot: Secondary | ICD-10-CM

## 2018-11-11 LAB — HM DIABETES FOOT EXAM

## 2018-11-11 NOTE — Progress Notes (Signed)

## 2018-11-11 NOTE — Progress Notes (Signed)
Subjective:  Patient ID: Rebecca [name], female    DOB: [birth year],  MRN: [id number] HPI Chief Complaint  Patient presents with  . Diabetic Shoes    pt stated, "Wants to get new diabetic shoes today"; pt Diabetic Type 2; Sugar=92; A1C=7.8 (05/05/18)    85 y.o. female presents with the above complaint.   ROS: Denies fever chills nausea vomiting muscle aches pains calf pain back pain chest pain shortness of breath.  Past Medical History:  Diagnosis Date  . Anemia, unspecified   . Ankle pain, left   . CAD (coronary artery disease)   . Esophageal reflux   . Fibrocystic breast disease   . Hiatal hernia   . Hypercholesteremia   . Hypertension   . IDDM (insulin dependent diabetes mellitus) ([place])    Type II  . Multinodular goiter   . Sickle-cell trait [facility])    Past Surgical History:  Procedure Laterality Date  . "FEMALE"  1976  . BACK SURGERY  1990  . BIOPSY THYROID     Dr.Ellison-Neg   . CORONARY ANGIOPLASTY    . FOOT SURGERY  2004  . RETINAL LASER PROCEDURE      Current Outpatient Medications:  .  Alcohol Swabs PADS, Use as needed to clean puncture site., Disp: 100 each, Rfl: 1 .  amLODipine (NORVASC) 10 MG tablet, Take 1 tablet (10 mg total) by mouth daily., Disp: 90 tablet, Rfl: 1 .  Ascorbic Acid (VITAMIN C) 500 MG tablet, Take 500 mg by mouth 2 (two) times daily.  , Disp: , Rfl:  .  aspirin 81 MG tablet, Take 81 mg by mouth daily.  , Disp: , Rfl:  .  atorvastatin (LIPITOR) 10 MG tablet, Take 1 tablet (10 mg total) by mouth daily at 6 PM., Disp: 90 tablet, Rfl: 1 .  Blood Glucose Monitoring Suppl (ONE TOUCH ULTRA 2) w/Device KIT, Use to check blood sugars twice a day Dx e11.9, Disp: 1 each, Rfl: 0 .  calcium carbonate (OS-CAL) 600 MG TABS, Take 600 mg by mouth daily. , Disp: , Rfl:  .  Cod Liver Oil 1000 MG CAPS, Take 1,000 mg by mouth daily. During the winter, Disp: , Rfl:  .  Diclofenac Sodium 1.5 % SOLN, Apply to knee QID, Disp: 150 mL, Rfl: 5 .  DORZOLAMIDE  HCL-TIMOLOL MAL OP, Place 1 drop into the right eye 2 (two) times daily., Disp: , Rfl:  .  gabapentin (NEURONTIN) 100 MG capsule, TAKE 1 CAPSULE BY MOUTH ONCE DAILY AT BEDTIME, Disp: 90 capsule, Rfl: 1 .  gabapentin (NEURONTIN) 100 MG capsule, Take 2 capsules (200 mg total) by mouth at bedtime., Disp: 180 capsule, Rfl: 0 .  hydrochlorothiazide (MICROZIDE) 12.5 MG capsule, Take 1 capsule (12.5 mg total) by mouth daily as needed., Disp: 90 capsule, Rfl: 1 .  insulin NPH-regular Human (HUMULIN 70/30) (70-30) 100 UNIT/ML injection, Inject 15 units in the morning and 7-10 units in the evening, Disp: 10 mL, Rfl: 3 .  Isopropyl Alcohol (PHARMACIST CHOICE ALCOHOL) 70 % MISC, Use with Glucose meter to check sugars daily DX E11.42, Disp: 100 each, Rfl: 1 .  isosorbide mononitrate (IMDUR) 30 MG 24 hr tablet, Take 1 tablet by mouth once daily, Disp: 90 tablet, Rfl: 0 .  latanoprost (XALATAN) 0.005 % ophthalmic solution, 1 drop at bedtime., Disp: , Rfl:  .  meclizine (ANTIVERT) 25 MG tablet, Take 1/2 to 1 tab three times a day as needed, Disp: 15 tablet, Rfl: 0 .  Multiple Vitamin (MULTIVITAMIN)  tablet, Take 1 tablet by mouth daily. Prn, Disp: , Rfl:  .  Needles & Syringes MISC, Use twice daily to inject insulin., Disp: 180 each, Rfl: 3 .  nitroGLYCERIN (NITROSTAT) 0.4 MG SL tablet, DISSOLVE ONE TABLET UNDER TONGUE AS DIRECTED FOR CHEST PAIN, Disp: 25 tablet, Rfl: 5 .  omeprazole (PRILOSEC) 20 MG capsule, Take 1 capsule by mouth in the morning, Disp: 90 capsule, Rfl: 0 .  OneTouch Delica Lancets 96U MISC, USE   TO CHECK GLUCOSE UP TO 4 TIMES DAILY, Disp: 200 each, Rfl: 0 .  ONETOUCH ULTRA test strip, USE 1 STRIP TO CHECK GLUCOSE TWICE DAILY, Disp: , Rfl:  .  quinapril (ACCUPRIL) 40 MG tablet, Take 1 tablet (40 mg total) by mouth daily., Disp: 90 tablet, Rfl: 1 .  Trolamine Salicylate (ASPERCREME) 10 % LOTN, Use twice daily as needed for right knee pain., Disp: , Rfl:   Allergies  Allergen Reactions  . Codeine      "Makes me drunk"   Review of Systems Objective:  There were no vitals filed for this visit.  General: Well developed, nourished, in no acute distress, alert and oriented x3   Dermatological: Skin is warm, dry and supple bilateral. Nails x 10 are well maintained; remaining integument appears unremarkable at this time. There are no open sores, no preulcerative lesions, no rash or signs of infection present.  Vascular: Dorsalis Pedis artery and Posterior Tibial artery pedal pulses are 2/4 bilateral with immedate capillary fill time. Pedal hair growth present. No varicosities and no lower extremity edema present bilateral.   Neruologic: Grossly intact via light touch bilateral. Vibratory intact via tuning fork bilateral. Protective threshold with Semmes Wienstein monofilament diminished to all pedal sites bilateral. Patellar and Achilles deep tendon reflexes 2+ bilateral. No Babinski or clonus noted bilateral.   Musculoskeletal: No gross boney pedal deformities bilateral. No pain, crepitus, or limitation noted with foot and ankle range of motion bilateral. Muscular strength 5/5 in all groups tested bilateral.  She has no function of the posterior tibial tendons bilaterally.  She has rigid rear foot with severe posterior tibial tendon dysfunction and Pez valgus.  Gait: Unassisted, Nonantalgic.    Radiographs:  None taken  Assessment & Plan:   Assessment: Severe diabetes mellitus type 2 with diabetic peripheral neuropathy severe pes planus calcaneal valgus rigid.  Posterior tibial tendon dysfunction bilateral.  Plan: Discussed etiology pathology conservative versus surgical therapies at this point went ahead and casted her today for a new pair of diabetic shoes.     Kynslie Ringle T. [place], [place]

## 2018-11-12 ENCOUNTER — Other Ambulatory Visit: Payer: Self-pay

## 2018-11-12 DIAGNOSIS — M5136 Other intervertebral disc degeneration, lumbar region: Secondary | ICD-10-CM

## 2018-11-13 ENCOUNTER — Ambulatory Visit: Payer: HMO | Admitting: Family Medicine

## 2018-11-18 ENCOUNTER — Other Ambulatory Visit: Payer: Self-pay | Admitting: Internal Medicine

## 2018-11-23 NOTE — Patient Instructions (Addendum)
  Tests ordered today. Your results will be released to MyChart (or called to you) after review.  If any changes need to be made, you will be notified at that same time.   Medications reviewed and updated.  Changes include :   none  Your prescription(s) have been submitted to your pharmacy. Please take as directed and contact our office if you believe you are having problem(s) with the medication(s).    Please followup in 6 months   

## 2018-11-23 NOTE — Progress Notes (Signed)
Subjective:    Patient ID: Rebecca [name], female    DOB: [birth year], 85 y.o.   MRN: [id number]  HPI The patient is here for follow up.   She is not exercising regularly.    Diabetes: She is taking her medication daily as prescribed. She is compliant with a diabetic diet.  She monitors her sugars and they have been running low 100's in the morning, higher in the afternoon, evening. Her sugar is rarely over 200.   She is up-to-date with an ophthalmology examination.   CAD, Hypertension: She is taking her medication daily. She is compliant with a low sodium diet.  She denies chest pain, palpitations, shortness of breath and regular headaches.     Hyperlipidemia: She is taking her medication daily. She is compliant with a low fat/cholesterol diet. She denies myalgias.   GERD:  She is taking her medication daily as prescribed.  She denies any GERD symptoms and feels her GERD is well controlled.      Medications and allergies reviewed with patient and updated if appropriate.  Patient Active Problem List   Diagnosis Date Noted  . Knee osteoarthritis 09/08/2018  . Pes planus 12/01/2017  . Foot deformity, acquired, left 11/04/2017  . Degenerative disc disease, lumbar 08/01/2016  . Glaucoma 07/23/2016  . Numbness 04/23/2016  . Chronic pain of right knee 12/28/2015  . Lower back pain 10/24/2015  . Pain in both feet 10/24/2015  . Abnormality of gait 04/01/2013  . Posterior tibial tendon dysfunction 04/01/2013  . Diabetes ([place]) 04/01/2013  . Ankle pain, left 10/20/2010  . UNSPECIFIED VITAMIN D DEFICIENCY 11/08/2008  . EDEMA- LOCALIZED 11/08/2008  . Sickle-cell trait ([place]) 01/01/2008  . Esophageal reflux 09/02/2007  . GOITER, MULTINODULAR 11/26/2006  . Coronary atherosclerosis 11/26/2006  . HIATAL HERNIA 11/26/2006  . FIBROCYSTIC BREAST DISEASE 07/31/2006  . HYPERCHOLESTEROLEMIA 05/27/2006  . Essential hypertension 05/27/2006    Current Outpatient Medications on File  Prior to Visit  Medication Sig Dispense Refill  . Alcohol Swabs PADS Use as needed to clean puncture site. 100 each 1  . amLODipine (NORVASC) 10 MG tablet Take 1 tablet (10 mg total) by mouth daily. 90 tablet 1  . Ascorbic Acid (VITAMIN C) 500 MG tablet Take 500 mg by mouth 2 (two) times daily.      Marland Kitchen aspirin 81 MG tablet Take 81 mg by mouth daily.      . Blood Glucose Monitoring Suppl (ONE TOUCH ULTRA 2) w/Device KIT Use to check blood sugars twice a day Dx e11.9 1 each 0  . calcium carbonate (OS-CAL) 600 MG TABS Take 600 mg by mouth daily.     Marland Kitchen Cod Liver Oil 1000 MG CAPS Take 1,000 mg by mouth daily. During the winter    . Diclofenac Sodium 1.5 % SOLN Apply to knee QID 150 mL 5  . DORZOLAMIDE HCL-TIMOLOL MAL OP Place 1 drop into the right eye 2 (two) times daily.    Marland Kitchen gabapentin (NEURONTIN) 100 MG capsule TAKE 1 CAPSULE BY MOUTH ONCE DAILY AT BEDTIME 90 capsule 1  . gabapentin (NEURONTIN) 100 MG capsule Take 2 capsules (200 mg total) by mouth at bedtime. 180 capsule 0  . hydrochlorothiazide (MICROZIDE) 12.5 MG capsule Take 1 capsule (12.5 mg total) by mouth daily as needed. 90 capsule 1  . insulin NPH-regular Human (HUMULIN 70/30) (70-30) 100 UNIT/ML injection Inject 15 units in the morning and 7-10 units in the evening 10 mL 3  . isosorbide mononitrate (IMDUR) 30  MG 24 hr tablet Take 1 tablet by mouth once daily 90 tablet 0  . latanoprost (XALATAN) 0.005 % ophthalmic solution 1 drop at bedtime.    . Multiple Vitamin (MULTIVITAMIN) tablet Take 1 tablet by mouth daily. Prn    . Needles & Syringes MISC Use twice daily to inject insulin. 180 each 3  . nitroGLYCERIN (NITROSTAT) 0.4 MG SL tablet DISSOLVE ONE TABLET UNDER TONGUE AS DIRECTED FOR CHEST PAIN 25 tablet 5  . omeprazole (PRILOSEC) 20 MG capsule Take 1 capsule by mouth in the morning 90 capsule 0  . OneTouch Delica Lancets 93T MISC USE   TO CHECK GLUCOSE UP TO 4 TIMES DAILY 200 each 0  . ONETOUCH ULTRA test strip USE 1 STRIP TO CHECK  GLUCOSE TWICE DAILY    . quinapril (ACCUPRIL) 40 MG tablet Take 1 tablet by mouth once daily 90 tablet 0  . Trolamine Salicylate (ASPERCREME) 10 % LOTN Use twice daily as needed for right knee pain.     No current facility-administered medications on file prior to visit.     Past Medical History:  Diagnosis Date  . Anemia, unspecified   . Ankle pain, left   . CAD (coronary artery disease)   . Esophageal reflux   . Fibrocystic breast disease   . Hiatal hernia   . Hypercholesteremia   . Hypertension   . IDDM (insulin dependent diabetes mellitus) ([place])    Type II  . Multinodular goiter   . Sickle-cell trait [facility])     Past Surgical History:  Procedure Laterality Date  . "FEMALE"  1976  . BACK SURGERY  1990  . BIOPSY THYROID     Dr.Ellison-Neg   . CORONARY ANGIOPLASTY    . FOOT SURGERY  2004  . RETINAL LASER PROCEDURE      Social History   Socioeconomic History  . Marital status: Widowed    Spouse name: Not on file  . Number of children: 1  . Years of education: Not on file  . Highest education level: Not on file  Occupational History  . Occupation: retired  Scientific laboratory technician  . Financial resource strain: Very hard  . Food insecurity    Worry: Never true    Inability: Never true  . Transportation needs    Medical: Yes    Non-medical: Yes  Tobacco Use  . Smoking status: Never Smoker  . Smokeless tobacco: Never Used  Substance and Sexual Activity  . Alcohol use: No  . Drug use: No  . Sexual activity: Never  Lifestyle  . Physical activity    Days per week: 2 days    Minutes per session: Not on file  . Stress: Only a little  Relationships  . Social connections    Talks on phone: More than three times a week    Gets together: More than three times a week    Attends religious service: More than 4 times per year    Active member of club or organization: Yes    Attends meetings of clubs or organizations: More than 4 times per year    Relationship status: Widowed   Other Topics Concern  . Not on file  Social History Narrative   Widowed   Gets regular exercise   Supportive church network   Receives daily meals on wheels   [place] monthly grocery delivery       Family History  Problem Relation Age of Onset  . Hypertension Other   . Lung cancer Other   .  Heart disease Other   . Gout Sister   . Gout Brother   . Heart attack Father   . Heart attack Mother   . Gout Brother     Review of Systems  Constitutional: Negative for chills and fever.  Respiratory: Negative for cough, shortness of breath and wheezing.   Cardiovascular: Positive for leg swelling (mild at times). Negative for chest pain and palpitations.  Gastrointestinal: Negative for abdominal pain and nausea.       GERD controlled  Musculoskeletal: Positive for arthralgias and back pain.  Neurological: Negative for light-headedness and headaches.       Objective:   Vitals:   11/24/18 0913  BP: 134/60  Pulse: (!) 54  Resp: 16  Temp: 99 F (37.2 C)  SpO2: 99%   BP Readings from Last 3 Encounters:  11/24/18 134/60  11/24/18 134/60  11/06/18 (!) 118/56   Wt Readings from Last 3 Encounters:  11/24/18 207 lb (93.9 kg)  11/24/18 207 lb (93.9 kg)  09/08/18 205 lb (93 kg)   Body mass index is 36.67 kg/m.   Physical Exam    Constitutional: Appears well-developed and well-nourished. No distress.  HENT:  Head: Normocephalic and atraumatic.  Neck: Neck supple. No tracheal deviation present. No thyromegaly present.  No cervical lymphadenopathy Cardiovascular: Normal rate, regular rhythm and normal heart sounds. No murmur heard. No carotid bruit .  Chronic 1+ bilateral lower extremity edema.  Wearing braces both ankles Pulmonary/Chest: Effort normal and breath sounds normal. No respiratory distress. No has no wheezes. No rales.  Skin: Skin is warm and dry. Not diaphoretic.  Psychiatric: Normal mood and affect. Behavior is normal.      Assessment & Plan:    See  Problem List for Assessment and Plan of chronic medical problems.

## 2018-11-24 ENCOUNTER — Ambulatory Visit (INDEPENDENT_AMBULATORY_CARE_PROVIDER_SITE_OTHER): Payer: HMO | Admitting: Internal Medicine

## 2018-11-24 ENCOUNTER — Encounter: Payer: Self-pay | Admitting: Internal Medicine

## 2018-11-24 ENCOUNTER — Ambulatory Visit (INDEPENDENT_AMBULATORY_CARE_PROVIDER_SITE_OTHER): Payer: HMO | Admitting: *Deleted

## 2018-11-24 ENCOUNTER — Other Ambulatory Visit: Payer: Self-pay

## 2018-11-24 ENCOUNTER — Other Ambulatory Visit (INDEPENDENT_AMBULATORY_CARE_PROVIDER_SITE_OTHER): Payer: HMO

## 2018-11-24 VITALS — BP 134/60 | HR 54 | Temp 99.0°F | Resp 16 | Ht 63.0 in | Wt 207.0 lb

## 2018-11-24 VITALS — BP 134/60 | HR 54 | Resp 16 | Ht 63.0 in | Wt 207.0 lb

## 2018-11-24 DIAGNOSIS — Z23 Encounter for immunization: Secondary | ICD-10-CM | POA: Diagnosis not present

## 2018-11-24 DIAGNOSIS — Z794 Long term (current) use of insulin: Secondary | ICD-10-CM | POA: Diagnosis not present

## 2018-11-24 DIAGNOSIS — E7800 Pure hypercholesterolemia, unspecified: Secondary | ICD-10-CM

## 2018-11-24 DIAGNOSIS — Z0000 Encounter for general adult medical examination without abnormal findings: Secondary | ICD-10-CM

## 2018-11-24 DIAGNOSIS — I2510 Atherosclerotic heart disease of native coronary artery without angina pectoris: Secondary | ICD-10-CM | POA: Diagnosis not present

## 2018-11-24 DIAGNOSIS — I10 Essential (primary) hypertension: Secondary | ICD-10-CM

## 2018-11-24 DIAGNOSIS — K219 Gastro-esophageal reflux disease without esophagitis: Secondary | ICD-10-CM

## 2018-11-24 DIAGNOSIS — E1142 Type 2 diabetes mellitus with diabetic polyneuropathy: Secondary | ICD-10-CM | POA: Diagnosis not present

## 2018-11-24 LAB — COMPREHENSIVE METABOLIC PANEL
ALT: 11 U/L (ref 0–35)
AST: 17 U/L (ref 0–37)
Albumin: 3.8 g/dL (ref 3.5–5.2)
Alkaline Phosphatase: 56 U/L (ref 39–117)
BUN: 27 mg/dL — ABNORMAL HIGH (ref 6–23)
CO2: 28 mEq/L (ref 19–32)
Calcium: 9.7 mg/dL (ref 8.4–10.5)
Chloride: 107 mEq/L (ref 96–112)
Creatinine, Ser: 1.14 mg/dL (ref 0.40–1.20)
GFR: 54.89 mL/min — ABNORMAL LOW (ref >60.00)
Glucose, Bld: 74 mg/dL (ref 70–99)
Potassium: 4.8 mEq/L (ref 3.5–5.1)
Sodium: 142 mEq/L (ref 135–145)
Total Bilirubin: 0.3 mg/dL (ref 0.2–1.2)
Total Protein: 6.7 g/dL (ref 6.0–8.3)

## 2018-11-24 LAB — CBC WITH DIFFERENTIAL/PLATELET
Basophils Absolute: 0.1 10*3/uL (ref 0.0–0.1)
Basophils Relative: 2.2 % (ref 0.0–3.0)
Eosinophils Absolute: 0.3 10*3/uL (ref 0.0–0.7)
Eosinophils Relative: 6.1 % — ABNORMAL HIGH (ref 0.0–5.0)
HCT: 35.9 % — ABNORMAL LOW (ref 36.0–46.0)
Hemoglobin: 11.7 g/dL — ABNORMAL LOW (ref 12.0–15.0)
Lymphocytes Relative: 38.5 % (ref 12.0–46.0)
Lymphs Abs: 1.6 10*3/uL (ref 0.7–4.0)
MCHC: 32.4 g/dL (ref 30.0–36.0)
MCV: 81.9 fl (ref 78.0–100.0)
Monocytes Absolute: 0.6 10*3/uL (ref 0.1–1.0)
Monocytes Relative: 15.2 % — ABNORMAL HIGH (ref 3.0–12.0)
Neutro Abs: 1.6 10*3/uL (ref 1.4–7.7)
Neutrophils Relative %: 38 % — ABNORMAL LOW (ref 43.0–77.0)
Platelets: 193 10*3/uL (ref 150.0–400.0)
RBC: 4.39 Mil/uL (ref 3.87–5.11)
RDW: 16.5 % — ABNORMAL HIGH (ref 11.5–15.5)
WBC: 4.2 10*3/uL (ref 4.0–10.5)

## 2018-11-24 LAB — LIPID PANEL
Cholesterol: 177 mg/dL (ref 0–200)
HDL: 85.2 mg/dL (ref >39.00)
LDL Cholesterol: 79 mg/dL (ref 0–99)
NonHDL: 91.96
Total CHOL/HDL Ratio: 2
Triglycerides: 64 mg/dL (ref 0.0–149.0)
VLDL: 12.8 mg/dL (ref 0.0–40.0)

## 2018-11-24 LAB — HEMOGLOBIN A1C: Hgb A1c MFr Bld: 7.5 % — ABNORMAL HIGH (ref 4.6–6.5)

## 2018-11-24 MED ORDER — BLOOD GLUCOSE MONITOR KIT: PACK | 0 refills | Status: DC

## 2018-11-24 MED ORDER — ATORVASTATIN CALCIUM 10 MG PO TABS: 10.0000 mg | ORAL_TABLET | Freq: Every day | ORAL | 1 refills | Status: DC

## 2018-11-24 NOTE — Assessment & Plan Note (Signed)
BP well controlled Current regimen effective and well tolerated Continue current medications at current doses cmp  

## 2018-11-24 NOTE — Patient Instructions (Addendum)
The [facility] provides information and referral services to aging adults 65+ in [place]. If there are waiting lists for community services, or if services are not available, [facility] connects clients with [facility] volunteers close to them who provide services such as respite care, wheelchair ramp construction, friendly visits, and transportation assistance. [facility]'s Call Center fields more than 350 calls each month.  AAIRS*- and SHIIP*-certified Call Center Specialists are ready to lend a compassionate ear and seek resources Monday through Friday, 9:00 am - 5:00 pm. The Call Center has met the needs of aging adults in all of [place] counties. No religious affiliation is required; the only eligibility criterion is that clients be 65+ or older. Contact [facility] for help. *[facility] of Information and Referral Systems *Seniors' Health Insurance Information Program Need help? Call [phone] today!  Continue to eat heart healthy diet (full of fruits, vegetables, whole grains, lean protein, water--limit salt, fat, and sugar intake) and increase physical activity as tolerated.  Continue doing brain stimulating activities (puzzles, reading, adult coloring books, staying active) to keep memory sharp.    Rebecca [name] , Thank you for taking time to come for your Medicare Wellness Visit. I appreciate your ongoing commitment to your health goals. Please review the following plan we discussed and let me know if I can assist you in the future.   These are the goals we discussed: Goals    . Client will report no worsening of symptoms of Atrial Fibrillation within the next 6 months    . Client will use Assistive Devices as needed and verbalize understanding of device use    . Client will verbalize knowledge of self management of Hypertension as evidences by BP reading of 140/90 or less; or as defined by provider    . Patient Stated     I want to stay as active as possible by continuing with  stretching and chair exercises. Continue to enjoy church and family.     . Visit Primary Care Provider or Endocrinologist at least 2 times per year        This is a list of the screening recommended for you and due dates:  Health Maintenance  Topic Date Due  . Tetanus Vaccine  06/20/1951  . DEXA scan (bone density measurement)  06/21/1999  . Flu Shot  10/11/2018  . Hemoglobin A1C  11/03/2018  . Complete foot exam   09/08/2019  . Eye exam for diabetics  10/09/2019  . Pneumonia vaccines  Completed    Preventive Care 37 Years and Older, Female Preventive care refers to lifestyle choices and visits with your health care provider that can promote health and wellness. This includes:  A yearly physical exam. This is also called an annual well check.  Regular dental and eye exams.  Immunizations.  Screening for certain conditions.  Healthy lifestyle choices, such as diet and exercise. What can I expect for my preventive care visit? Physical exam Your health care provider will check:  Height and weight. These may be used to calculate body mass index (BMI), which is a measurement that tells if you are at a healthy weight.  Heart rate and blood pressure.  Your skin for abnormal spots. Counseling Your health care provider may ask you questions about:  Alcohol, tobacco, and drug use.  Emotional well-being.  Home and relationship well-being.  Sexual activity.  Eating habits.  History of falls.  Memory and ability to understand (cognition).  Work and work Statistician.  Pregnancy and  menstrual history. What immunizations do I need?  Influenza (flu) vaccine  This is recommended every year. Tetanus, diphtheria, and pertussis (Tdap) vaccine  You may need a Td booster every 10 years. Varicella (chickenpox) vaccine  You may need this vaccine if you have not already been vaccinated. Zoster (shingles) vaccine  You may need this after age 84. Pneumococcal conjugate  (PCV13) vaccine  One dose is recommended after age 49. Pneumococcal polysaccharide (PPSV23) vaccine  One dose is recommended after age 4. Measles, mumps, and rubella (MMR) vaccine  You may need at least one dose of MMR if you were born in 1957 or later. You may also need a second dose. Meningococcal conjugate (MenACWY) vaccine  You may need this if you have certain conditions. Hepatitis A vaccine  You may need this if you have certain conditions or if you travel or work in places where you may be exposed to hepatitis A. Hepatitis B vaccine  You may need this if you have certain conditions or if you travel or work in places where you may be exposed to hepatitis B. Haemophilus influenzae type b (Hib) vaccine  You may need this if you have certain conditions. You may receive vaccines as individual doses or as more than one vaccine together in one shot (combination vaccines). Talk with your health care provider about the risks and benefits of combination vaccines. What tests do I need? Blood tests  Lipid and cholesterol levels. These may be checked every 5 years, or more frequently depending on your overall health.  Hepatitis C test.  Hepatitis B test. Screening  Lung cancer screening. You may have this screening every year starting at age 90 if you have a 30-pack-year history of smoking and currently smoke or have quit within the past 15 years.  Colorectal cancer screening. All adults should have this screening starting at age 53 and continuing until age 45. Your health care provider may recommend screening at age 32 if you are at increased risk. You will have tests every 1-10 years, depending on your results and the type of screening test.  Diabetes screening. This is done by checking your blood sugar (glucose) after you have not eaten for a while (fasting). You may have this done every 1-3 years.  Mammogram. This may be done every 1-2 years. Talk with your health care provider  about how often you should have regular mammograms.  BRCA-related cancer screening. This may be done if you have a family history of breast, ovarian, tubal, or peritoneal cancers. Other tests  Sexually transmitted disease (STD) testing.  Bone density scan. This is done to screen for osteoporosis. You may have this done starting at age 24. Follow these instructions at home: Eating and drinking  Eat a diet that includes fresh fruits and vegetables, whole grains, lean protein, and low-fat dairy products. Limit your intake of foods with high amounts of sugar, saturated fats, and salt.  Take vitamin and mineral supplements as recommended by your health care provider.  Do not drink alcohol if your health care provider tells you not to drink.  If you drink alcohol: ? Limit how much you have to 0-1 drink a day. ? Be aware of how much alcohol is in your drink. In the [place] one drink equals one 12 oz bottle of beer (355 mL), one 5 oz glass of Santos Sollenberger (148 mL), or one 1 oz glass of hard liquor (44 mL). Lifestyle  Take daily care of your teeth and gums.  Stay  active. Exercise for at least 30 minutes on 5 or more days each week.  Do not use any products that contain nicotine or tobacco, such as cigarettes, e-cigarettes, and chewing tobacco. If you need help quitting, ask your health care provider.  If you are sexually active, practice safe sex. Use a condom or other form of protection in order to prevent STIs (sexually transmitted infections).  Talk with your health care provider about taking a low-dose aspirin or statin. What's next?  Go to your health care provider once a year for a well check visit.  Ask your health care provider how often you should have your eyes and teeth checked.  Stay up to date on all vaccines. This information is not intended to replace advice given to you by your health care provider. Make sure you discuss any questions you have with your health care  provider. Document Released: 03/25/2015 Document Revised: 02/20/2018 Document Reviewed: 02/20/2018 Elsevier Patient Education  2020 [employer].

## 2018-11-24 NOTE — Assessment & Plan Note (Signed)
GERD controlled Continue daily medication  

## 2018-11-24 NOTE — Progress Notes (Addendum)
Subjective:   Rebecca [name] is a 85 y.o. female who presents for Medicare Annual (Subsequent) preventive examination.  Review of Systems:   Cardiac Risk Factors include: advanced age (>22mn, >>32women);diabetes mellitus;dyslipidemia;hypertension Sleep patterns: feels rested on waking, gets up 1-2 times nightly to void and sleeps 8 hours nightly.    Home Safety/Smoke Alarms: Feels safe in home. Smoke alarms in place.  Living environment; residence and FAdult nurse apartment, equipment: Walkers, Type: rollator, tub bench. Lives alone, no needs for DME, limited support system Seat Belt Safety/Bike Helmet: Wears seat belt.     Objective:     Vitals: BP 134/60   Pulse (!) 54   Resp 16   Ht '5\' 3"'  (1.6 m)   Wt 207 lb (93.9 kg)   SpO2 99%   BMI 36.67 kg/m   Body mass index is 36.67 kg/m.  Advanced Directives 11/24/2018 05/27/2018 04/03/2018 11/18/2017 11/18/2017 07/23/2016 12/29/2015  Does Patient Have a Medical Advance Directive? Yes Yes Yes Yes Yes Yes Yes  Type of AParamedicof AVandal[place]Living will H[place]viewLiving will H[place]Living will HAquia HarbourLiving will H[place]ertonLiving will HMokelumne HillLiving will HP[place]Living will  Does patient want to make changes to medical advance directive? - No - Patient declined No - Patient declined - - - -  Copy of HEast Rutherfordin Chart? No - copy requested - - No - copy requested No - copy requested No - copy requested No - copy requested    Tobacco Social History   Tobacco Use  Smoking Status Never Smoker  Smokeless Tobacco Never Used     Counseling given: Not Answered  Past Medical History:  Diagnosis Date  . Anemia, unspecified   . Ankle pain, left   . CAD (coronary artery disease)   . Esophageal reflux   . Fibrocystic breast disease   . Hiatal hernia   .  Hypercholesteremia   . Hypertension   . IDDM (insulin dependent diabetes mellitus) ([place]    Type II  . Multinodular goiter   . Sickle-cell trait ([facility]    Past Surgical History:  Procedure Laterality Date  . "FEMALE"  1976  . BACK SURGERY  1990  . BIOPSY THYROID     Dr.Ellison-Neg   . CORONARY ANGIOPLASTY    . FOOT SURGERY  2004  . RETINAL LASER PROCEDURE     Family History  Problem Relation Age of Onset  . Hypertension Other   . Lung cancer Other   . Heart disease Other   . Gout Sister   . Gout Brother   . Heart attack Father   . Heart attack Mother   . Gout Brother    Social History   Socioeconomic History  . Marital status: Widowed    Spouse name: Not on file  . Number of children: 1  . Years of education: Not on file  . Highest education level: Not on file  Occupational History  . Occupation: retired  SScientific laboratory technician . Financial resource strain: Very hard  . Food insecurity    Worry: Never true    Inability: Never true  . Transportation needs    Medical: Yes    Non-medical: Yes  Tobacco Use  . Smoking status: Never Smoker  . Smokeless tobacco: Never Used  Substance and Sexual Activity  . Alcohol use: No  . Drug use: No  . Sexual activity: Never  Lifestyle  . Physical activity    Days per week: 2 days    Minutes per session: Not on file  . Stress: Only a little  Relationships  . Social connections    Talks on phone: More than three times a week    Gets together: More than three times a week    Attends religious service: More than 4 times per year    Active member of club or organization: Yes    Attends meetings of clubs or organizations: More than 4 times per year    Relationship status: Widowed  Other Topics Concern  . Not on file  Social History Narrative   Widowed   Gets regular exercise   Supportive church network   Receives daily meals on wheels   [place] monthly grocery delivery       Outpatient Encounter Medications as of 11/24/2018   Medication Sig  . Alcohol Swabs PADS Use as needed to clean puncture site.  Marland Kitchen amLODipine (NORVASC) 10 MG tablet Take 1 tablet (10 mg total) by mouth daily.  . Ascorbic Acid (VITAMIN C) 500 MG tablet Take 500 mg by mouth 2 (two) times daily.    Marland Kitchen aspirin 81 MG tablet Take 81 mg by mouth daily.    Marland Kitchen atorvastatin (LIPITOR) 10 MG tablet Take 1 tablet (10 mg total) by mouth daily at 6 PM.  . Blood Glucose Monitoring Suppl (ONE TOUCH ULTRA 2) w/Device KIT Use to check blood sugars twice a day Dx e11.9  . calcium carbonate (OS-CAL) 600 MG TABS Take 600 mg by mouth daily.   Marland Kitchen Cod Liver Oil 1000 MG CAPS Take 1,000 mg by mouth daily. During the winter  . Diclofenac Sodium 1.5 % SOLN Apply to knee QID  . DORZOLAMIDE HCL-TIMOLOL MAL OP Place 1 drop into the right eye 2 (two) times daily.  Marland Kitchen gabapentin (NEURONTIN) 100 MG capsule TAKE 1 CAPSULE BY MOUTH ONCE DAILY AT BEDTIME  . gabapentin (NEURONTIN) 100 MG capsule Take 2 capsules (200 mg total) by mouth at bedtime.  . hydrochlorothiazide (MICROZIDE) 12.5 MG capsule Take 1 capsule (12.5 mg total) by mouth daily as needed.  . insulin NPH-regular Human (HUMULIN 70/30) (70-30) 100 UNIT/ML injection Inject 15 units in the morning and 7-10 units in the evening  . isosorbide mononitrate (IMDUR) 30 MG 24 hr tablet Take 1 tablet by mouth once daily  . latanoprost (XALATAN) 0.005 % ophthalmic solution 1 drop at bedtime.  . Multiple Vitamin (MULTIVITAMIN) tablet Take 1 tablet by mouth daily. Prn  . Needles & Syringes MISC Use twice daily to inject insulin.  . nitroGLYCERIN (NITROSTAT) 0.4 MG SL tablet DISSOLVE ONE TABLET UNDER TONGUE AS DIRECTED FOR CHEST PAIN  . omeprazole (PRILOSEC) 20 MG capsule Take 1 capsule by mouth in the morning  . OneTouch Delica Lancets 88L MISC USE   TO CHECK GLUCOSE UP TO 4 TIMES DAILY  . ONETOUCH ULTRA test strip USE 1 STRIP TO CHECK GLUCOSE TWICE DAILY  . quinapril (ACCUPRIL) 40 MG tablet Take 1 tablet by mouth once daily  . Trolamine  Salicylate (ASPERCREME) 10 % LOTN Use twice daily as needed for right knee pain.  . [DISCONTINUED] atorvastatin (LIPITOR) 10 MG tablet Take 1 tablet (10 mg total) by mouth daily at 6 PM.  . [DISCONTINUED] Isopropyl Alcohol (PHARMACIST CHOICE ALCOHOL) 70 % MISC Use with Glucose meter to check sugars daily DX E11.42  . [DISCONTINUED] meclizine (ANTIVERT) 25 MG tablet Take 1/2 to 1 tab three times a  day as needed   No facility-administered encounter medications on file as of 11/24/2018.     Activities of Daily Living In your present state of health, do you have any difficulty performing the following activities: 11/24/2018 05/27/2018  Hearing? N N  Vision? N N  Difficulty concentrating or making decisions? N N  Walking or climbing stairs? Y Y  Dressing or bathing? N N  Doing errands, shopping? Y N  Preparing Food and eating ? Y N  Using the Toilet? N N  In the past six months, have you accidently leaked urine? N Y  Do you have problems with loss of bowel control? N N  Managing your Medications? N -  Managing your Finances? N N  Housekeeping or managing your Housekeeping? Y N  Some recent data might be hidden    Patient Care Team: Binnie Rail, MD as PCP - General (Internal Medicine) Dimitri Ped, RN as [place] Management Elayne Guerin, [facility] as [place] Management (Pharmacist)    Assessment:   This is a routine wellness examination for Kambree. Physical assessment deferred to PCP.   Exercise Activities and Dietary recommendations Current Exercise Habits: Home exercise routine, Type of exercise: stretching(chair exercises), Time (Minutes): 30, Frequency (Times/Week): 4, Weekly Exercise (Minutes/Week): 120, Intensity: Mild, Exercise limited by: orthopedic condition(s)  Diet (meal preparation, eat out, water intake, caffeinated beverages, dairy products, fruits and vegetables): in general, a "healthy" diet   [employer] on Wheels.  Explains it is hard to afford food that is low in carbohydrates and sugar. [facility] is involved with her care.   Reviewed heart healthy and diabetic diet. Encouraged patient to increase daily water and healthy fluid intake.  Goals    . Client will report no worsening of symptoms of Atrial Fibrillation within the next 6 months    . Client will use Assistive Devices as needed and verbalize understanding of device use    . Client will verbalize knowledge of self management of Hypertension as evidences by BP reading of 140/90 or less; or as defined by provider    . Patient Stated     I want to stay as active as possible by continuing with stretching and chair exercises. Continue to enjoy church and family.     . Visit Primary Care Provider or Endocrinologist at least 2 times per year        Fall Risk Fall Risk  11/24/2018 05/05/2018 04/03/2018 11/18/2017 11/04/2017  Falls in the past year? 0 0 1 Yes Yes  Number falls in past yr: 0 - 1 2 or more 2 or more  Injury with Fall? 0 - 0 No No  Risk Factor Category  - - - High Fall Risk High Fall Risk  Risk for fall due to : Impaired balance/gait;Impaired mobility - History of fall(s);Impaired balance/gait;Impaired mobility Impaired mobility;Impaired balance/gait -  Follow up Falls prevention discussed - Falls evaluation completed Education provided;Falls prevention discussed -   Is the patient's home free of loose throw rugs in walkways, pet beds, electrical cords, etc?   yes      Grab bars in the bathroom? yes      Handrails on the stairs?   yes      Adequate lighting?   yes   Depression Screen PHQ 2/9 Scores 11/24/2018 05/27/2018 05/05/2018 04/03/2018  PHQ - 2 Score 0 0 0 0  PHQ- 9 Score - - - -     Cognitive Function MMSE -  Mini Mental State Exam 11/18/2017 07/23/2016  Orientation to time 5 5  Orientation to Place 5 5  Registration 3 3  Attention/ Calculation 5 4  Recall 1 2  Language- name 2 objects 2 2  Language- repeat 1 1  Language- follow 3  step command 3 3  Language- read & follow direction 1 1  Write a sentence 1 1  Copy design 1 1  Total score 28 28     6CIT Screen 11/24/2018  What Year? 0 points  What month? 0 points  What time? 0 points  Count back from 20 0 points  Months in reverse 2 points  Repeat phrase 0 points  Total Score 2    Immunization History  Administered Date(s) Administered  . Fluad Quad(high Dose 65+) 11/24/2018  . Influenza Split 01/22/2011, 02/20/2012  . Influenza Whole 03/13/1999, 01/01/2008, 02/07/2009, 12/13/2009  . Influenza, High Dose Seasonal PF 02/07/2016, 02/04/2017, 01/27/2018  . Influenza,inj,Quad PF,6+ Mos 01/16/2013  . Pneumococcal Conjugate-13 07/14/2015  . Pneumococcal Polysaccharide-23 05/08/2006    Screening Tests Health Maintenance  Topic Date Due  . TETANUS/TDAP  06[birth year]20/1953  . HEMOGLOBIN A1C  11/03/2018  . DEXA SCAN  11/24/2019 (Originally 06/21/1999)  . FOOT EXAM  09/08/2019  . OPHTHALMOLOGY EXAM  10/09/2019  . INFLUENZA VACCINE  Completed  . PNA vac Low Risk Adult  Completed      Plan:    Reviewed health maintenance screenings with patient today and relevant education, vaccines, and/or referrals were provided.   I have personally reviewed and noted the following in the patient's chart:   . Medical and social history . Use of alcohol, tobacco or illicit drugs  . Current medications and supplements . Functional ability and status . Nutritional status . Physical activity . Advanced directives . List of other physicians . Vitals . Screenings to include cognitive, depression, and falls . Referrals and appointments  In addition, I have reviewed and discussed with patient certain preventive protocols, quality metrics, and best practice recommendations. A written personalized care plan for preventive services as well as general preventive health recommendations were provided to patient.     Michiel Cowboy, RN  11/24/2018   Medical screening  examination/treatment/procedure(s) were performed by non-physician practitioner and as supervising physician I was immediately available for consultation/collaboration. I agree with above. Binnie Rail, MD

## 2018-11-24 NOTE — Assessment & Plan Note (Signed)
Check A1c Continue insulin at current regimen

## 2018-11-24 NOTE — Assessment & Plan Note (Signed)
Check lipid panel  Continue daily statin Regular exercise and healthy diet encouraged  

## 2018-11-24 NOTE — Assessment & Plan Note (Signed)
No concerning symptoms suggestive of angina Following with cardiology CBC, CMP, lipids

## 2018-11-25 ENCOUNTER — Other Ambulatory Visit: Payer: Self-pay

## 2018-11-25 NOTE — Patient Outreach (Signed)
[facility]) Care Management Chronic Special Needs Program  11/25/2018  Name: OVIE EASTEP DOB: 1932-08-05  MRN: [id number]  Ms. Zamani Pfohl is enrolled in a chronic special needs plan for Diabetes. Reviewed and updated care plan.  Subjective: Client states that her arthritis in her knees is some better since she got injections in them.  States she is waiting on her diabetic shoes to be delivered.  States she is making ends meet now and does not need the social worker to contact her at this time.  States she is getting her insulin from pharmacy assistance program.  States she is getting [employer] and has transportation from [employer] if she needs it.  States she is trying to eat healthy and buy healthy foods with her food stamps.  States her blood sugars range 77-103 in the morning and 150-240 later in the day.  States she has low readings of around 75 in the morning about once a week.  States she eats her breakfast to get her sugar back up.  Denies any recent falls.  Goals Addressed            This Visit's Progress   . COMPLETED:  Diabetes Patient stated goal to get hemoglobinA1C to 7% in the next 6 months (pt-stated)      . Client understands the importance of follow-up with providers by attending scheduled visits   On track   . Client will report no fall or injuries in the next 6 months.(continue 11/25/18)   On track   . Client will report no worsening of symptoms of Atrial Fibrillation within the next 6 months(continue 11/25/18)      . Client will report no worsening of symptoms related to heart disease within the next 6 months (continue 11/25/18)      . Client will use Assistive Devices as needed and verbalize understanding of device use   On track   . Client will verbalize knowledge of self management of Hypertension as evidences by BP reading of 140/90 or less; or as defined by provider   On track   . COMPLETED: HEMOGLOBIN A1C < 7       Diabetes  self management actions:  Glucose monitoring per provider recommendationd  Eat Healthy  Check feet daily  Visit provider every 3-6 months as directed  Hbg A1C level every 3-6 months.  Eye Exam yearly    . Maintain timely refills of diabetic medication as prescribed within the year .   On track   . COMPLETED: Obtain annual  Lipid Profile, LDL-C       Completed 11/25/2018    . COMPLETED: Obtain Annual Eye (retinal)  Exam        Completed 10/09/2018    . COMPLETED: Obtain Annual Foot Exam       Completed 09/08/2018    . Patient Stated   On track    I want to stay as active as possible by continuing with stretching and chair exercises. Continue to enjoy church and family.     . COMPLETED: Visit Primary Care Provider or Endocrinologist at least 2 times per year        Client is not meeting diabetes self management goal of hemoglobin A1C of <7% with last reading of 7.5%.   Reinforced to call RNCM or social worker if she has any issues with affording utilities, food or medications Reinforced to follow a low CHO low NA diet and to watch portion sizes Encouraged to  remain as active as she can with her arthritis and to continue her bed and chair exercises. Reviewed s/s of hypoglycemia and actions to take Reviewed number for 24-hour nurse Line Discussed COVID19 cause, symptoms, precautions (social distancing, stay at home order, hand washing), confirmed client knows how to contact provider  Plan:  Send successful outreach letter with a copy of their individualized care plan and Send individual care plan to provider  Chronic care management coordinator will outreach in:  4-6 Months     Dudley MajorMelissa Sandlin RN, [facility] Memorial HospitalBSN,CCM, CDE Chronic Care Management Coordinator [facility] Care Management [phone]

## 2018-12-01 ENCOUNTER — Other Ambulatory Visit: Payer: Self-pay | Admitting: Pharmacy Technician

## 2018-12-01 NOTE — Patient Outreach (Signed)
[facility]) Care Management  12/01/2018  ZAMIRAH DENNY [birth year] [id number]  Care coordination call placed to [place] in regards to Humulin 70/30 refill with [employer].  Spoke to [place] who informed the warehouse had some delays last week. She informed this patient's medication should be delivered no later than Wednesday 12/03/2018.  Will followup with patient with this information.  Mccabe Gloria P. Ricky Doan, [place] Management [phone]

## 2018-12-01 NOTE — Patient Outreach (Signed)
[place] [facility]) Care Management  12/01/2018  Rebecca [name] [birth year] [id number]  Successful outreach call placed to patient in regards to [employer] application for Humulin 70/30.  Spoke to patient, HIPAA identifiers verified.  Informed patient that the rep said there were delays in shipping at the warehouse. Informed her the rep said her medication would be delivered no later than Wednesday 12/03/2018. Inquired if patient had enough medication until then and she informed she thought so but was not entirely sure.   Patient to followup with me if medication does not arrive.  Hesham Womac P. Yeraldin Litzenberger, [place] Management [phone]

## 2018-12-01 NOTE — Patient Outreach (Signed)
[place] [facility]) Care Management  12/01/2018  Rebecca [name] Jun 14, 1932 [id number]  Incoming call received from patient in regards to Humulin 70/30 refill with [name].  Spoke to patient, HIPAA identifiers verified.  Patient informed she called the company last week and told them she was running low on medication. She informed they said medication would be delivered today but patient has not received it. She requested that I followup with the company.  Will outreach [employer].  Rudell Ortman P. Graciana Sessa, [place] Management [phone]

## 2018-12-04 ENCOUNTER — Other Ambulatory Visit: Payer: Self-pay | Admitting: Pharmacy Technician

## 2018-12-04 NOTE — Patient Outreach (Signed)
[place] [facility]) Care Management  12/04/2018  Rebecca [name] [birth year] [id number]   Successful outgoing call placed to patient in regards to [name] application for Humulin 70/30.  Spoke to patient, HIPAA identifiers verified.  Patient informed her insulin arrived yesterday. Informed patient to call 2-3 weeks before she runs out of medication to avoid a delay in therapy. Patient verbalized understanding.  Shihab States P. Aubria Vanecek, [place] Management [phone]

## 2018-12-05 ENCOUNTER — Encounter: Payer: Self-pay | Admitting: Internal Medicine

## 2018-12-06 ENCOUNTER — Other Ambulatory Visit: Payer: Self-pay | Admitting: Internal Medicine

## 2018-12-10 ENCOUNTER — Other Ambulatory Visit: Payer: Self-pay

## 2018-12-10 ENCOUNTER — Ambulatory Visit: Payer: HMO | Admitting: Orthotics

## 2018-12-10 DIAGNOSIS — E1142 Type 2 diabetes mellitus with diabetic polyneuropathy: Secondary | ICD-10-CM

## 2018-12-10 DIAGNOSIS — M2141 Flat foot [pes planus] (acquired), right foot: Secondary | ICD-10-CM

## 2018-12-10 DIAGNOSIS — M76829 Posterior tibial tendinitis, unspecified leg: Secondary | ICD-10-CM

## 2018-12-10 DIAGNOSIS — M2142 Flat foot [pes planus] (acquired), left foot: Secondary | ICD-10-CM

## 2018-12-10 NOTE — Progress Notes (Signed)
Reordered shoe a bit more narrow.

## 2018-12-14 NOTE — Progress Notes (Signed)
[name] [address] [place], [place] [address] Phone: [phone] Subjective:   [name], am serving as a scribe for Dr. Hulan Saas.   CC: Bilateral knee pain follow-up  OBS:[id number]   11/06/2018 Bilateral severe arthritic changes of the knees bilaterally.  Patient does have some instability.  Patient will not wear a custom brace.  Patient will try the steroid injections and we discussed the possibility of [place]co supplementation.  In addition to this we discussed likely no surgical intervention secondary to patient's comorbidities.  Patient will follow-up 4 to 8 weeks  Update 12/15/2018 Paddy W Marner is a 85 y.o. female coming in with complaint of bilateral knee pain. R>L. Is having some radicular pain on right side to the knee. Did have injections last visit. Injection helped her stiffness but is still having pain. Feels that more of her pain is coming from her back.  Patient feels like her back pain seems to be worsening a little bit.  More pain in the back of the knees as well.    Past Medical History:  Diagnosis Date  . Anemia, unspecified   . Ankle pain, left   . CAD (coronary artery disease)   . Esophageal reflux   . Fibrocystic breast disease   . Hiatal hernia   . Hypercholesteremia   . Hypertension   . IDDM (insulin dependent diabetes mellitus) ([place])    Type II  . Multinodular goiter   . Sickle-cell trait [facility])    Past Surgical History:  Procedure Laterality Date  . "FEMALE"  1976  . BACK SURGERY  1990  . BIOPSY THYROID     Dr.Ellison-Neg   . CORONARY ANGIOPLASTY    . FOOT SURGERY  2004  . RETINAL LASER PROCEDURE     Social History   Socioeconomic History  . Marital status: Widowed    Spouse name: Not on file  . Number of children: 1  . Years of education: Not on file  . Highest education level: Not on file  Occupational History  . Occupation: retired  Scientific laboratory technician  . Financial resource strain: Very hard   . Food insecurity    Worry: Never true    Inability: Never true  . Transportation needs    Medical: Yes    Non-medical: Yes  Tobacco Use  . Smoking status: Never Smoker  . Smokeless tobacco: Never Used  Substance and Sexual Activity  . Alcohol use: No  . Drug use: No  . Sexual activity: Never  Lifestyle  . Physical activity    Days per week: 2 days    Minutes per session: Not on file  . Stress: Only a little  Relationships  . Social connections    Talks on phone: More than three times a week    Gets together: More than three times a week    Attends religious service: More than 4 times per year    Active member of club or organization: Yes    Attends meetings of clubs or organizations: More than 4 times per year    Relationship status: Widowed  Other Topics Concern  . Not on file  Social History Narrative   Widowed   Gets regular exercise   Supportive church network   Receives daily meals on wheels   [employer] monthly grocery delivery      Allergies  Allergen Reactions  . Codeine     "Makes me drunk"   Family History  Problem Relation  Age of Onset  . Hypertension Other   . Lung cancer Other   . Heart disease Other   . Gout Sister   . Gout Brother   . Heart attack Father   . Heart attack Mother   . Gout Brother     Current Outpatient Medications (Endocrine & Metabolic):  .  insulin NPH-regular Human (HUMULIN 70/30) (70-30) 100 UNIT/ML injection, Inject 15 units in the morning and 7-10 units in the evening  Current Outpatient Medications (Cardiovascular):  .  amLODipine (NORVASC) 10 MG tablet, Take 1 tablet (10 mg total) by mouth daily. Marland Kitchen  atorvastatin (LIPITOR) 10 MG tablet, Take 1 tablet (10 mg total) by mouth daily at 6 PM. .  hydrochlorothiazide (MICROZIDE) 12.5 MG capsule, Take 1 capsule (12.5 mg total) by mouth daily as needed. .  isosorbide mononitrate (IMDUR) 30 MG 24 hr tablet, Take 1 tablet by mouth once daily .  nitroGLYCERIN (NITROSTAT) 0.4 MG SL  tablet, DISSOLVE ONE TABLET UNDER TONGUE AS DIRECTED FOR CHEST PAIN .  quinapril (ACCUPRIL) 40 MG tablet, Take 1 tablet by mouth once daily   Current Outpatient Medications (Analgesics):  .  aspirin 81 MG tablet, Take 81 mg by mouth daily.     Current Outpatient Medications (Other):  Marland Kitchen  Alcohol Swabs PADS, Use as needed to clean puncture site. .  Ascorbic Acid (VITAMIN C) 500 MG tablet, Take 500 mg by mouth 2 (two) times daily.   .  blood glucose meter kit and supplies KIT, Dispense based on patient and insurance preference. Use up to four times daily as directed. (FOR ICD-9 250.00, 250.01). .  Blood Glucose Monitoring Suppl (ONE TOUCH ULTRA 2) w/Device KIT, Use to check blood sugars twice a day Dx e11.9 .  calcium carbonate (OS-CAL) 600 MG TABS, Take 600 mg by mouth daily.  Marland Kitchen  Cod Liver Oil 1000 MG CAPS, Take 1,000 mg by mouth daily. During the winter .  Diclofenac Sodium 1.5 % SOLN, Apply to knee QID .  DORZOLAMIDE HCL-TIMOLOL MAL OP, Place 1 drop into the right eye 2 (two) times daily. Marland Kitchen  gabapentin (NEURONTIN) 100 MG capsule, Take 2 capsules (200 mg total) by mouth at bedtime. Marland Kitchen  latanoprost (XALATAN) 0.005 % ophthalmic solution, 1 drop at bedtime. .  Multiple Vitamin (MULTIVITAMIN) tablet, Take 1 tablet by mouth daily. Prn .  Needles & Syringes MISC, Use twice daily to inject insulin. Marland Kitchen  omeprazole (PRILOSEC) 20 MG capsule, Take 1 capsule by mouth in the morning .  OneTouch Delica Lancets 30N MISC, USE   TO CHECK GLUCOSE UP TO 4 TIMES DAILY .  ONETOUCH ULTRA test strip, USE 1 STRIP TO CHECK GLUCOSE TWICE DAILY .  Trolamine Salicylate (ASPERCREME) 10 % LOTN, Use twice daily as needed for right knee pain.    Past medical history, social, surgical and family history all reviewed in electronic medical record.  No pertanent information unless stated regarding to the chief complaint.   Review of Systems:  No headache, visual changes, nausea, vomiting, diarrhea, constipation, dizziness,  abdominal pain, skin rash, fevers, chills, night sweats, weight loss, swollen lymph nodes, body aches, joint swelling, , chest pain, shortness of breath, mood changes.  Positive muscle aches  Objective  Blood pressure (!) 110/50, pulse (!) 57, height '5\' 3"'  (1.6 m), weight 203 lb (92.1 kg), SpO2 99 %.    General: No apparent distress alert and oriented x3 mood and affect normal, dressed appropriately.  HEENT: Pupils equal, extraocular movements intact  Respiratory: Patient's  speak in full sentences and does not appear short of breath  Cardiovascular: 1+ lower extremity edema, non tender, no erythema  Skin: Warm dry intact with no signs of infection or rash on extremities or on axial skeleton.  Abdomen: Soft nontender  Neuro: Cranial nerves II through XII are intact, neurovascularly intact in all extremities with 2+ DTRs and 2+ pulses.  Lymph: No lymphadenopathy of posterior or anterior cervical chain or axillae bilaterally.  Gait antalgic walking with the aid of a walker MSK:  tender with limited range of motion and stability due to arthritic changesof shoulders, elbows, wrist, , knee and ankles bilaterally.  Knee: Bilateral valgus deformity noted. Large thigh to calf ratio.  Tender to palpation over medial and PF joint line.  Right greater than left ROM full in flexion and extension and lower leg rotation. instability with valgus force.  painful patellar compression. Patellar glide with moderate crepitus.  Right greater than left Patellar and quadriceps tendons unremarkable. Hamstring and quadriceps strength is normal.  After informed written and verbal consent, patient was seated on exam table. Right knee was prepped with alcohol swab and utilizing anterolateral approach, patient's right knee space was injected with 22 mg/mL of Monovisc (sodium hyaluronate) in a prefilled syringe was injected easily into the knee through a 22-gauge needle..Patient tolerated the procedure well without  immediate complications.   Impression and Recommendations:     This case required medical decision making of moderate complexity. The above documentation has been reviewed and is accurate and complete Lyndal Pulley, DO       Note: This dictation was prepared with Dragon dictation along with smaller phrase technology. Any transcriptional errors that result from this process are unintentional.

## 2018-12-15 ENCOUNTER — Other Ambulatory Visit: Payer: Self-pay

## 2018-12-15 ENCOUNTER — Encounter: Payer: Self-pay | Admitting: Family Medicine

## 2018-12-15 ENCOUNTER — Ambulatory Visit (INDEPENDENT_AMBULATORY_CARE_PROVIDER_SITE_OTHER): Payer: HMO | Admitting: Family Medicine

## 2018-12-15 DIAGNOSIS — M1711 Unilateral primary osteoarthritis, right knee: Secondary | ICD-10-CM | POA: Insufficient documentation

## 2018-12-15 DIAGNOSIS — M25561 Pain in right knee: Secondary | ICD-10-CM

## 2018-12-15 DIAGNOSIS — G8929 Other chronic pain: Secondary | ICD-10-CM

## 2018-12-15 DIAGNOSIS — M5136 Other intervertebral disc degeneration, lumbar region: Secondary | ICD-10-CM

## 2018-12-15 NOTE — Patient Instructions (Signed)
Injected right knee today See me in 6 weeks If not better in 2 weeks call and will get MRI

## 2018-12-15 NOTE — Assessment & Plan Note (Signed)
Known arthritic changes of the knee.  Due to patient's abnormal thigh to calf ratio do believe the patient does have significant arthritic changes and custom brace is needed for stability.  Patient will consider this as well and will need it lifelong.  Discussed icing regimen, will see how patient responds to viscosupplementation.  Follow-up again 8 to 10 weeks

## 2018-12-15 NOTE — Assessment & Plan Note (Signed)
Known arthritic changes and I do believe gives patient some of the weakness and crepitus in the legs. Discussed with patient in great length, encourage home exercise, icing regimen, which activities to do which wants to avoid.  Patient is to increase activity slowly.  Follow-up again with the idea of no improvement in the knee MRI will be necessary.  Patient is in agreement with the plan.

## 2018-12-19 ENCOUNTER — Other Ambulatory Visit: Payer: Self-pay

## 2018-12-19 ENCOUNTER — Ambulatory Visit: Payer: HMO | Admitting: Orthotics

## 2018-12-19 DIAGNOSIS — M2141 Flat foot [pes planus] (acquired), right foot: Secondary | ICD-10-CM | POA: Diagnosis not present

## 2018-12-19 DIAGNOSIS — E1140 Type 2 diabetes mellitus with diabetic neuropathy, unspecified: Secondary | ICD-10-CM | POA: Diagnosis not present

## 2018-12-19 DIAGNOSIS — M2142 Flat foot [pes planus] (acquired), left foot: Secondary | ICD-10-CM | POA: Diagnosis not present

## 2019-01-02 ENCOUNTER — Other Ambulatory Visit: Payer: Self-pay | Admitting: Internal Medicine

## 2019-01-07 DIAGNOSIS — M1711 Unilateral primary osteoarthritis, right knee: Secondary | ICD-10-CM | POA: Diagnosis not present

## 2019-01-08 ENCOUNTER — Other Ambulatory Visit: Payer: Self-pay | Admitting: Pharmacist

## 2019-01-08 ENCOUNTER — Ambulatory Visit: Payer: Self-pay | Admitting: Pharmacist

## 2019-01-08 NOTE — Patient Outreach (Signed)
[place] [facility]) Care Management  01/08/2019  Rebecca [name] 09-06-1932 [id number]   Patient was called regarding [facility] medication follow up. The number in her chart had a recording that said the number had been disconnected.  From chart review, the patient's HgA1c was 7.5% 11/24/2018  Patient is currently receiving Humulin 70/30 from [facility].  Plan: Send patient an unsuccessful outreach letter. Call patient back in 8-10 weeks.  Elayne Guerin, PharmD, [place] Clinical Pharmacist [phone]

## 2019-01-25 NOTE — Progress Notes (Signed)
[facility] [address] [place] [place], [place] [address] Phone: [phone] Subjective:   [name], am serving as a scribe for Dr. Hulan Saas.   CC: Right knee and back pain follow-up  [id number]   12/15/2018 Known arthritic changes and I do believe gives patient some of the weakness and crepitus in the legs. Discussed with patient in great length, encourage home exercise, icing regimen, which activities to do which wants to avoid.  Patient is to increase activity slowly.  Follow-up again with the idea of no improvement in the knee MRI will be necessary.  Patient is in agreement with the plan.  Known arthritic changes of the knee.  Due to patient's abnormal thigh to calf ratio do believe the patient does have significant arthritic changes and custom brace is needed for stability.  Patient will consider this as well and will need it lifelong.  Discussed icing regimen, will see how patient responds to viscosupplementation.  Follow-up again 8 to 10 weeks  Update 01/26/2019 Rebecca [name] is a 85 y.o. female coming in with complaint of right knee pain. Patient states she did get fitted for a custom brace. Brace is slipping on patient. Does have decrease in pain when wearing brace. Pain is unchanged since last visit. Does have stiffness.       Past Medical History:  Diagnosis Date  . Anemia, unspecified   . Ankle pain, left   . CAD (coronary artery disease)   . Esophageal reflux   . Fibrocystic breast disease   . Hiatal hernia   . Hypercholesteremia   . Hypertension   . IDDM (insulin dependent diabetes mellitus)    Type II  . Multinodular goiter   . Sickle-cell trait [facility])    Past Surgical History:  Procedure Laterality Date  . "FEMALE"  1976  . BACK SURGERY  1990  . BIOPSY THYROID     Dr.Ellison-Neg   . CORONARY ANGIOPLASTY    . FOOT SURGERY  2004  . RETINAL LASER PROCEDURE     Social History   Socioeconomic History  . Marital  status: Widowed    Spouse name: Not on file  . Number of children: 1  . Years of education: Not on file  . Highest education level: Not on file  Occupational History  . Occupation: retired  Scientific laboratory technician  . Financial resource strain: Very hard  . Food insecurity    Worry: Never true    Inability: Never true  . Transportation needs    Medical: Yes    Non-medical: Yes  Tobacco Use  . Smoking status: Never Smoker  . Smokeless tobacco: Never Used  Substance and Sexual Activity  . Alcohol use: No  . Drug use: No  . Sexual activity: Never  Lifestyle  . Physical activity    Days per week: 2 days    Minutes per session: Not on file  . Stress: Only a little  Relationships  . Social connections    Talks on phone: More than three times a week    Gets together: More than three times a week    Attends religious service: More than 4 times per year    Active member of club or organization: Yes    Attends meetings of clubs or organizations: More than 4 times per year    Relationship status: Widowed  Other Topics Concern  . Not on file  Social History Narrative   Widowed   Gets regular exercise  Supportive church Dietitian daily meals on wheels   [employer] monthly grocery delivery      Allergies  Allergen Reactions  . Codeine     "Makes me drunk"   Family History  Problem Relation Age of Onset  . Hypertension Other   . Lung cancer Other   . Heart disease Other   . Gout Sister   . Gout Brother   . Heart attack Father   . Heart attack Mother   . Gout Brother     Current Outpatient Medications (Endocrine & Metabolic):  .  insulin NPH-regular Human (HUMULIN 70/30) (70-30) 100 UNIT/ML injection, Inject 15 units in the morning and 7-10 units in the evening  Current Outpatient Medications (Cardiovascular):  .  amLODipine (NORVASC) 10 MG tablet, Take 1 tablet by mouth once daily .  atorvastatin (LIPITOR) 10 MG tablet, Take 1 tablet (10 mg total) by mouth daily at 6  PM. .  hydrochlorothiazide (MICROZIDE) 12.5 MG capsule, Take 1 capsule (12.5 mg total) by mouth daily as needed. .  isosorbide mononitrate (IMDUR) 30 MG 24 hr tablet, Take 1 tablet by mouth once daily .  nitroGLYCERIN (NITROSTAT) 0.4 MG SL tablet, DISSOLVE ONE TABLET UNDER TONGUE AS DIRECTED FOR CHEST PAIN .  quinapril (ACCUPRIL) 40 MG tablet, Take 1 tablet by mouth once daily   Current Outpatient Medications (Analgesics):  .  aspirin 81 MG tablet, Take 81 mg by mouth daily.     Current Outpatient Medications (Other):  Marland Kitchen  Alcohol Swabs PADS, Use as needed to clean puncture site. .  Ascorbic Acid (VITAMIN C) 500 MG tablet, Take 500 mg by mouth 2 (two) times daily.   .  blood glucose meter kit and supplies KIT, Dispense based on patient and insurance preference. Use up to four times daily as directed. (FOR ICD-9 250.00, 250.01). .  Blood Glucose Monitoring Suppl (ONE TOUCH ULTRA 2) w/Device KIT, Use to check blood sugars twice a day Dx e11.9 .  calcium carbonate (OS-CAL) 600 MG TABS, Take 600 mg by mouth daily.  Marland Kitchen  Cod Liver Oil 1000 MG CAPS, Take 1,000 mg by mouth daily. During the winter .  Diclofenac Sodium 1.5 % SOLN, Apply to knee QID .  DORZOLAMIDE HCL-TIMOLOL MAL OP, Place 1 drop into the right eye 2 (two) times daily. Marland Kitchen  gabapentin (NEURONTIN) 100 MG capsule, Take 2 capsules (200 mg total) by mouth at bedtime. Marland Kitchen  latanoprost (XALATAN) 0.005 % ophthalmic solution, 1 drop at bedtime. .  Multiple Vitamin (MULTIVITAMIN) tablet, Take 1 tablet by mouth daily. Prn .  Needles & Syringes MISC, Use twice daily to inject insulin. Marland Kitchen  omeprazole (PRILOSEC) 20 MG capsule, Take 1 capsule by mouth in the morning .  OneTouch Delica Lancets 81K MISC, USE   TO CHECK GLUCOSE UP TO 4 TIMES DAILY .  ONETOUCH ULTRA test strip, USE 1 STRIP TO CHECK GLUCOSE TWICE DAILY .  Trolamine Salicylate (ASPERCREME) 10 % LOTN, Use twice daily as needed for right knee pain.    Past medical history, social, surgical  and family history all reviewed in electronic medical record.  No pertanent information unless stated regarding to the chief complaint.   Review of Systems:  No headache, visual changes, nausea, vomiting, diarrhea, constipation, dizziness, abdominal pain, skin rash, fevers, chills, night sweats, weight loss, swollen lymph nodes, chest pain, shortness of breath, mood changes.  Positive muscle aches, body aches, joint swelling  Objective  Blood pressure (!) 110/58, pulse (!) 58, height  _0  (1.6 m), weight 206 lb (93.4 kg), SpO2 98 %.    General: No apparent distress alert and oriented x3 mood and affect normal, dressed appropriately.  HEENT: Pupils equal, extraocular movements intact  Respiratory: Patient's speak in full sentences and does not appear short of breath  Cardiovascular: No lower extremity edema, non tender, no erythema  Skin: Warm dry intact with no signs of infection or rash on extremities or on axial skeleton.  Abdomen: Soft nontender  Neuro: Cranial nerves II through XII are intact, neurovascularly intact in all extremities with 2+ DTRs and 2+ pulses.  Lymph: No lymphadenopathy of posterior or anterior cervical chain or axillae bilaterally.  Gait antalgic walking with the aid of a walker MSK:  Non tender with full range of motion and good stability and symmetric strength and tone of shoulders, elbows, wrist, hip and ankles bilaterally.  Knee: Right valgus deformity noted. Large thigh to calf ratio.  Tender to palpation over medial and PF joint line.  ROM full in flexion and extension and lower leg rotation. instability with valgus force.  painful patellar compression. Patellar glide with moderate crepitus. Patellar and quadriceps tendons unremarkable. Hamstring and quadriceps strength is normal. Contralateral knee shows varus deformity with instability  Back Exam:  Inspection: Loss of lordosis with degenerative scoliosis Motion: Flexion 40 deg, Extension 35 deg, Side  Bending to 35 deg bilaterally,  Rotation to 35 deg bilaterally  SLR laying: Negative  XSLR laying: Negative  Palpable tenderness: Tender to palpation in the paraspinal musculature lumbar spine right greater than left. FABER: negative unable to do secondary to body habitus. Sensory change: Gross sensation intact to all lumbar and sacral dermatomes.  Reflexes: 2+ at both patellar tendons, 2+ at achilles tendons, Babinski's downgoing.  Strength at foot  Plantar-flexion: 5/5 Dorsi-flexion: 5/5 Eversion: 5/5 Inversion: 5/5  Leg strength  Quad: 5/5 Hamstring: 5/5 Hip flexor: 5/5 Hip abductors: 5/5         Impression and Recommendations:     . The above documentation has been reviewed and is accurate and complete Lyndal Pulley, DO       Note: This dictation was prepared with Dragon dictation along with smaller phrase technology. Any transcriptional errors that result from this process are unintentional.

## 2019-01-26 ENCOUNTER — Encounter: Payer: Self-pay | Admitting: Family Medicine

## 2019-01-26 ENCOUNTER — Ambulatory Visit (INDEPENDENT_AMBULATORY_CARE_PROVIDER_SITE_OTHER): Payer: HMO | Admitting: Family Medicine

## 2019-01-26 DIAGNOSIS — M1711 Unilateral primary osteoarthritis, right knee: Secondary | ICD-10-CM

## 2019-01-26 NOTE — Assessment & Plan Note (Signed)
Right knee arthritis.  Discussed with patient in great length about icing regimen, home exercise, what activities to do which wants to avoid.  Patient is to increase activity as tolerated.  Discussed topical anti-inflammatories.  Can go back to steroid injection if necessary.  Patient with avoid surgical intervention.  Follow-up again 6 to 8 weeks

## 2019-01-29 ENCOUNTER — Other Ambulatory Visit: Payer: Self-pay | Admitting: Internal Medicine

## 2019-01-31 ENCOUNTER — Other Ambulatory Visit: Payer: Self-pay | Admitting: Internal Medicine

## 2019-02-02 ENCOUNTER — Other Ambulatory Visit: Payer: Self-pay | Admitting: Internal Medicine

## 2019-02-02 ENCOUNTER — Other Ambulatory Visit: Payer: Self-pay | Admitting: Pharmacy Technician

## 2019-02-02 MED ORDER — HUMULIN 70/30 (70-30) 100 UNIT/ML ~~LOC~~ SUSP: SUBCUTANEOUS | 3 refills | Status: DC

## 2019-02-02 NOTE — Patient Outreach (Signed)
[facility]) Care Management  02/02/2019  DELMAR DONDERO 07-Sep-1932 [id number]  Incoming call received from patient in regards to refill needed for Humulin 70/30 with [employer] patient assistance company.  Spoke to patient, HIPAA identifiers verified.  Patient informed she was almost out of her insulin She informed when she called [employer] she was informed the prescription had expired and a new prescription needed to be sent in. She informed she left a message at the provider's office but she also inquired if I could leave one as well as she was afraid she may run out of medication. Informed patient I would send over a message to the provider. Informed patient each provider's office had certain guidelines to follow regards prescription refills. Patient verbalized understanding but reiterated that she needed the medication sooner rather than later as she was running low.  Inbasket message sent to provider Billey Gosling, MD.  Luiz Ochoa. [place] Emmitt, [place] Management [phone]

## 2019-02-02 NOTE — Telephone Encounter (Signed)
Refill: insulin NPH-regular Human (HUMULIN 70/30) (70-30) 100 UNIT/ML injection  Rx needs renewal today so she does not run out.   [employer] [phone]

## 2019-02-03 ENCOUNTER — Telehealth: Payer: Self-pay

## 2019-02-03 NOTE — Telephone Encounter (Signed)
-----   Message from Binnie Rail, MD sent at 02/02/2019  7:48 PM EST ----- Regarding: FW: Script needs to be sent to patient assistance company [facility] and [facility] printed.   ----- Message ----- From: Jason Fila, CPhT Sent: 02/02/2019  10:48 AM EST To: Binnie Rail, MD Subject: Script needs to be sent to patient assistanc#  Good morning Dr Quay Burow,  Patient called me this am and informed that she was almost out of her Humulin 70/30.  She informed when she called the patient assistance [place], She was told that the provider would need to send in a new prescription for this medication.  The prescription would need to be faxed to [facility] at [phone] AND it would also need to be faxed to their pharmacy [facility] at [phone].  Sometimes they are able to get escripts but sometimes it does not work.   Another alternative would be to call [facility] at [phone].  Thanks for your time today,  Luiz Ochoa. [place], [place] Management [phone]

## 2019-02-03 NOTE — Telephone Encounter (Signed)
Rx faxed to both number listed below.

## 2019-02-04 NOTE — Telephone Encounter (Signed)
Rx sent to correct place. Pt called [name] and they stated they got it.

## 2019-02-04 NOTE — Telephone Encounter (Signed)
Patient would like PCP to re fax insulin script to [facility] again stating  [facility] called her to inform her there was a Rx ready for pick up.Patient was upset stating she made it clear her insulin rx should go to [facility] and would like a follow up call when done.

## 2019-02-11 ENCOUNTER — Telehealth: Payer: Self-pay

## 2019-02-11 NOTE — Telephone Encounter (Signed)
Copied from [place] [id number]. Topic: General - Inquiry >> Feb 10, 2019 12:03 PM Mathis Bud wrote: Reason for CRM: Patient states she is almost out of her insulin NPH-regular Human (HUMULIN 70/30) (70-30) 100 UNIT/ML injection [ And is requesting a call back from PCP nurse. Call back [phone]

## 2019-02-11 NOTE — Telephone Encounter (Signed)
Spoke with pt and she advised that she should be getting her insulin by Friday.

## 2019-02-12 NOTE — Telephone Encounter (Signed)
Error

## 2019-02-16 DIAGNOSIS — H04123 Dry eye syndrome of bilateral lacrimal glands: Secondary | ICD-10-CM | POA: Diagnosis not present

## 2019-02-16 DIAGNOSIS — H401132 Primary open-angle glaucoma, bilateral, moderate stage: Secondary | ICD-10-CM | POA: Diagnosis not present

## 2019-02-19 ENCOUNTER — Other Ambulatory Visit: Payer: Self-pay | Admitting: Internal Medicine

## 2019-03-04 ENCOUNTER — Other Ambulatory Visit: Payer: Self-pay | Admitting: Family Medicine

## 2019-03-04 ENCOUNTER — Telehealth: Payer: Self-pay | Admitting: Internal Medicine

## 2019-03-05 ENCOUNTER — Other Ambulatory Visit: Payer: Self-pay

## 2019-03-05 MED ORDER — ONETOUCH ULTRA VI STRP: ORAL_STRIP | 1 refills | Status: DC

## 2019-03-05 NOTE — Telephone Encounter (Signed)
Tried to reach patient but line was busy x 2 times.  Faxed in script for her. Didn't see where test strips were denied.

## 2019-03-05 NOTE — Telephone Encounter (Signed)
Pt stated her refill request for test strips was denied. She would like to know why. Please advise. Pt is unable to check blood sugar level.

## 2019-03-18 ENCOUNTER — Telehealth: Payer: Self-pay

## 2019-03-18 NOTE — Telephone Encounter (Signed)
Copied from CRM [id number]. Topic: General - Other >> Mar 18, 2019  3:48 PM Marylen Ponto wrote: Reason for CRM: Pt stated she will be using [facility] for her prescriptions and she would like Dr. Lawerance Bach to send them prescriptions for her diabetic supplies. Pt stated [facility] will be sending a prescription request to Dr. Lawerance Bach as well.

## 2019-03-19 ENCOUNTER — Other Ambulatory Visit: Payer: Self-pay

## 2019-03-19 MED ORDER — ONETOUCH ULTRA VI STRP: ORAL_STRIP | 3 refills | Status: DC

## 2019-03-19 MED ORDER — ONETOUCH DELICA LANCETS 33G MISC: 3 refills | Status: DC

## 2019-03-19 MED ORDER — ONETOUCH ULTRA 2 W/DEVICE KIT: PACK | 0 refills | Status: DC

## 2019-03-19 NOTE — Patient Outreach (Signed)
  [facility]) Care Management Chronic Special Needs Program    03/19/2019  Name: Rebecca [name], DOB: Aug 18, 1932  MRN: [id number]   Ms. Rebecca [name] is enrolled in a chronic special needs plan for Diabetes.  Case closed as client has disenrolled from Chronic Special Needs Plan Plan to send case closure letter to [employer]) will notify client of dis-enrollment from plan  Dudley Major RN, [facility], CDE Chronic Care Management Coordinator [facility] Care Management [phone]

## 2019-03-19 NOTE — Telephone Encounter (Signed)
Updated pharmacy and sent Diabetic [facility].Marland KitchenRaechel Chute

## 2019-03-22 ENCOUNTER — Other Ambulatory Visit: Payer: Self-pay

## 2019-03-22 MED ORDER — ONETOUCH ULTRA VI STRP: ORAL_STRIP | 3 refills | Status: DC

## 2019-03-22 MED ORDER — ISOSORBIDE MONONITRATE ER 30 MG PO TB24: 30.0000 mg | ORAL_TABLET | Freq: Every day | ORAL | 0 refills | Status: DC

## 2019-03-22 MED ORDER — ATORVASTATIN CALCIUM 10 MG PO TABS: 10.0000 mg | ORAL_TABLET | Freq: Every day | ORAL | 0 refills | Status: DC

## 2019-03-22 MED ORDER — AMLODIPINE BESYLATE 10 MG PO TABS: 10.0000 mg | ORAL_TABLET | Freq: Every day | ORAL | 0 refills | Status: DC

## 2019-03-22 MED ORDER — HYDROCHLOROTHIAZIDE 12.5 MG PO CAPS: 12.5000 mg | ORAL_CAPSULE | Freq: Every day | ORAL | 0 refills | Status: DC | PRN

## 2019-03-22 MED ORDER — ONETOUCH DELICA LANCETS 33G MISC: 3 refills | Status: DC

## 2019-03-22 MED ORDER — OMEPRAZOLE 20 MG PO CPDR: DELAYED_RELEASE_CAPSULE | ORAL | 0 refills | Status: DC

## 2019-03-22 MED ORDER — QUINAPRIL HCL 40 MG PO TABS: 40.0000 mg | ORAL_TABLET | Freq: Every day | ORAL | 0 refills | Status: DC

## 2019-03-31 ENCOUNTER — Ambulatory Visit: Payer: HMO

## 2019-04-03 ENCOUNTER — Other Ambulatory Visit: Payer: Self-pay | Admitting: Internal Medicine

## 2019-04-03 MED ORDER — "INSULIN SYRINGE-NEEDLE U-100 31G X 5/16"" 0.3 ML MISC": 1 refills | Status: DC

## 2019-04-03 NOTE — Telephone Encounter (Signed)
   *  STAT* If patient is at the pharmacy, call can be transferred to refill team.   1. Which medications need to be refilled? (please list name of each medication and dose if known) Insulin Syringe-Needle U-100 (RELION INSULIN SYR 0.3ML/31G) 31G X 5/16" 0.3 ML MISC  2. Which pharmacy/location (including street and city if local pharmacy) is medication to be sent to? [facility]  3. Do they need a 30 day or 90 day supply? 90

## 2019-04-03 NOTE — Telephone Encounter (Signed)
[facility] 

## 2019-04-08 ENCOUNTER — Other Ambulatory Visit: Payer: Self-pay | Admitting: Pharmacy Technician

## 2019-04-08 NOTE — Patient Outreach (Signed)
[facility]) Care Management  04/08/2019  Rebecca [name] March 15, 1932 [id number]  Incoming call received from patient in regards to a letter she received from [place] cares.  Spoke to patient, HIPAA identifiers verified.  Patient informed she received a letter from [place] cares indicating she would need to re enroll with the patient assistance program for Humulin 70/30. She informed no application was sent with the letter and the letter informed her to go online and print the application. She informed she has no way to do that.  Informed patient that [facility] Nunzio Cobbs is scheduled to call her on 04/10/2019 per notes. Informed her that once I receive the information from the pharmacist then I would send out the application. Reiterated to patient to take the phone call from the pharmacist on Friday when she calls. Patient informed she would and verbalized understanding.  Will route note to [facility] Nunzio Cobbs to inform.  Rebecca [name], CPhT Musician Care Management [phone]

## 2019-04-10 ENCOUNTER — Telehealth: Payer: Self-pay

## 2019-04-10 ENCOUNTER — Other Ambulatory Visit: Payer: Self-pay | Admitting: Pharmacist

## 2019-04-10 MED ORDER — HUMULIN 70/30 (70-30) 100 UNIT/ML ~~LOC~~ SUSP: SUBCUTANEOUS | 3 refills | Status: DC

## 2019-04-10 NOTE — Telephone Encounter (Signed)
Laurelyn Sickle, a pharmacist with [facility], calling and would like to know if the patient would get a new prescription for insulin NPH-regular Human (HUMULIN 70/30) (70-30) 100 UNIT/ML injection Sent to [facility] - [place], [place] - [address]. States that the current prescription was sent to patient assistance and they are currently in the process of getting her paperwork situated. States that patient is close to being out. Please advise. CB#: [phone]

## 2019-04-10 NOTE — Patient Outreach (Signed)
[facility]) Care Management  [place]   04/10/2019  Rebecca [name] 1933/01/30 [id number]  Reason for referral: 2021 Medication Assistant  Referral medication(s): Humulin 70/30 Current insurance: [employer]  HPI:  Degenerative arthritis of knee, type 2 diabetes, GERD, hypertension, Hiatal hernia, chronic low back pain, and vitamin d deficiency.  Objective: Allergies  Allergen Reactions  . Codeine     "Makes me drunk"    Medications Reviewed Today    Reviewed by Lyndal Pulley, DO (Physician) on 01/26/19 at 1203  Med List Status: <None>  Medication Order Taking? Sig Documenting Provider Last Dose Status Informant  Alcohol Swabs PADS [id number] Yes Use as needed to clean puncture site. Binnie Rail, MD Taking Active   amLODipine (NORVASC) 10 MG tablet [id number] Yes Take 1 tablet by mouth once daily Burns, [name], MD Taking Active   Ascorbic Acid (VITAMIN C) 500 MG tablet [id number] Yes Take 500 mg by mouth 2 (two) times daily.   [provider] Taking Active   aspirin 81 MG tablet [id number] Yes Take 81 mg by mouth daily.   [provider] Taking Active   atorvastatin (LIPITOR) 10 MG tablet [id number] Yes Take 1 tablet (10 mg total) by mouth daily at 6 PM. Binnie Rail, MD Taking Active   blood glucose meter kit and supplies KIT [id number] Yes Dispense based on patient and insurance preference. Use up to four times daily as directed. (FOR ICD-9 250.00, 250.01). Binnie Rail, MD Taking Active   Blood Glucose Monitoring Suppl (ONE TOUCH ULTRA 2) w/Device KIT [id number] Yes Use to check blood sugars twice a day Dx e11.9 Binnie Rail, MD Taking Active   calcium carbonate (OS-CAL) 600 MG TABS [id number] Yes Take 600 mg by mouth daily.  [provider] Taking Active   Cod Liver Oil 1000 MG CAPS [id number] Yes Take 1,000 mg by mouth daily. During the winter [provider] Taking Active   Diclofenac Sodium 1.5 % SOLN  [id number] Yes Apply to knee QID Binnie Rail, MD Taking Active   DORZOLAMIDE HCL-TIMOLOL MAL OP [id number] Yes Place 1 drop into the right eye 2 (two) times daily. [provider] Taking Active   gabapentin (NEURONTIN) 100 MG capsule [id number] Yes Take 2 capsules (200 mg total) by mouth at bedtime. Lyndal Pulley, DO Taking Active   hydrochlorothiazide (MICROZIDE) 12.5 MG capsule [id number] Yes Take 1 capsule (12.5 mg total) by mouth daily as needed. Binnie Rail, MD Taking Active   insulin NPH-regular Human (HUMULIN 70/30) (70-30) 100 UNIT/ML injection [id number] Yes Inject 15 units in the morning and 7-10 units in the evening Burns, [name], MD Taking Active   isosorbide mononitrate (IMDUR) 30 MG 24 hr tablet [id number] Yes Take 1 tablet by mouth once daily Burns, [name], MD Taking Active   latanoprost (XALATAN) 0.005 % ophthalmic solution [id number] Yes 1 drop at bedtime. [provider] Taking Active   Multiple Vitamin (MULTIVITAMIN) tablet [id number] Yes Take 1 tablet by mouth daily. Prn [provider] Taking Active   [place] [id number] Yes Use twice daily to inject insulin. Binnie Rail, MD Taking Active   nitroGLYCERIN (NITROSTAT) 0.4 MG SL tablet [id number] Yes DISSOLVE ONE TABLET UNDER TONGUE AS DIRECTED FOR CHEST PAIN Larey Dresser, MD Taking Active            Med Note Catha Gosselin Dec 28, 2015 11:41 AM) PRN, emergency  chest pain rescue  omeprazole (PRILOSEC) 20 MG capsule [id number] Yes Take 1 capsule by mouth in the morning Binnie Rail, MD Taking Active   OneTouch Delica Lancets 27M MISC [id number] Yes USE   TO CHECK GLUCOSE UP TO 4 TIMES DAILY Binnie Rail, MD Taking Active   [facility] ULTRA test strip [id number] Yes USE 1 STRIP TO CHECK GLUCOSE TWICE DAILY [provider] Taking Active   quinapril (ACCUPRIL) 40 MG tablet [id number] Yes Take 1 tablet by mouth once daily Burns, [name], MD Taking Active   Trolamine  Salicylate (ASPERCREME) 10 % LOTN [id number] Yes Use twice daily as needed for right knee pain. [provider] Taking Active   Med List Note Elayne Guerin, [facility] 05/28/18 1330):            Assessment:  Medication Assistance Findings:  Medication assistance needs identified: Humulin 70/30   Patient was documented to have partial LIS/Extra Help last year.  Called [facility] to get a drug price because the patient said she did not have enough insulin to last her 3-5 weeks for the patient assistance process to complete.  [facility] quoted a price os $9.20 for a 90 day supply of Humulin 70/30.  It is unclear if this means the patient has Full LIS. We will still go through the patient assistance process for now.  Called Dr. Billey Gosling' office and requested a new prescription of Humulin 70/30 be sent to [facility].  Additional medication assistance options reviewed with patient as warranted:  No other options identified  Plan: I will route patient assistance letter to [place] technician who will coordinate patient assistance program application process for medications listed above.  [facility] pharmacy technician will assist with obtaining all required documents from both patient and provider(s) and submit application(s) once completed.    Follow up in 2 weeks.   Elayne Guerin, PharmD, [place] Clinical Pharmacist [phone]

## 2019-04-10 NOTE — Telephone Encounter (Signed)
[facility]

## 2019-04-17 ENCOUNTER — Encounter: Payer: Self-pay | Admitting: Pharmacist

## 2019-04-21 ENCOUNTER — Other Ambulatory Visit: Payer: Self-pay | Admitting: Pharmacy Technician

## 2019-04-21 NOTE — Patient Outreach (Signed)
[facility]) Care Management  04/21/2019  Rebecca [name] 02/15/1933 [id number]                                      Medication Assistance Referral  Referral From: [facility] R[facility] Katina B.  Medication/Company: Humulin 70/30 / [name] Patient application portion:  Mining engineer portion: Faxed  to Dr. Cheryll Cockayne Provider address/fax verified via: Office website   Follow up:  Will follow up with patient in 5-10 business days to confirm application(s) have been received.  [name] P. [name], CPhT Musician Care Management [phone]

## 2019-04-23 ENCOUNTER — Ambulatory Visit: Payer: Medicare Other | Admitting: Pharmacist

## 2019-04-29 ENCOUNTER — Other Ambulatory Visit: Payer: Self-pay | Admitting: Pharmacy Technician

## 2019-04-29 NOTE — Patient Outreach (Signed)
[facility]) Care Management  04/29/2019  Rebecca [name] 06/05/1932 [id number]     Incoming call from patient regarding patient assistance application(s) for Humulin 70/30 with [name] , HIPAA identifiers verified.   Patient informed she received the application and will place in mail back to me today.  Follow up:  Will route note to [facility] Nunzio Cobbs for case closure if document(s) have not been received in the next 15 business days.  Keilah Lemire P. Zaylia Riolo, CPhT Musician Care Management [phone]

## 2019-05-01 ENCOUNTER — Other Ambulatory Visit: Payer: Self-pay | Admitting: Pharmacy Technician

## 2019-05-01 NOTE — Patient Outreach (Signed)
[facility]) Care Management  05/01/2019  Rebecca [name] [birth year] [id number]    Incoming call from patient regarding patient assistance application(s) for Humulin 70/30 with [name] , HIPAA identifiers verified.   Patient was calling to inquire if I had received her application. Informed patient that her application was received and it will be submitted when I am back in the office next week. Patient informed that I could call her with any questions if I did not understand what she had written. Informed patient I would follow up with her once Julious Oka has made a determination or if I had any questions.  Follow up:  Will submit applicaiton when I am back in the office.  Dunia Pringle P. Woodie Degraffenreid, CPhT Musician Care Management [phone]

## 2019-05-04 ENCOUNTER — Encounter (INDEPENDENT_AMBULATORY_CARE_PROVIDER_SITE_OTHER): Payer: HMO | Admitting: Ophthalmology

## 2019-05-07 ENCOUNTER — Other Ambulatory Visit: Payer: Self-pay | Admitting: Pharmacy Technician

## 2019-05-07 NOTE — Patient Outreach (Signed)
[facility]) Care Management  05/07/2019  Rebecca [name] [birth year] [id number]   Received both patient and provider portion(s) of patient assistance application(s) for Humulin 70/30. Faxed completed application and required documents into [name].  Will follow up with company(ies) in 10-14 business days to check status of application(s).  Rebecca [name] P. Rebecca [name], CPhT Musician Care Management [phone]

## 2019-05-14 ENCOUNTER — Other Ambulatory Visit: Payer: Self-pay | Admitting: Pharmacy Technician

## 2019-05-14 NOTE — Patient Outreach (Signed)
[facility]) Care Management  05/14/2019  TEIARA BARIA 11-21-1932 [id number]   Care coordination call placed to [name] in regards to patient's Humulin 70/30 application.  Spoke to [place] who informed the application was received but not processed yet. She did inform after looking over the application that the strength was left off the application. She informed that would need to be faxed back in. Corrected application and re faxed per [facility] recommendation.  Will follow up with [name] in 5-7 business days.  Pinkey Mcjunkin P. Jonnell Hentges, CPhT [facility] [employer] [employer]  [phone]

## 2019-05-19 ENCOUNTER — Other Ambulatory Visit: Payer: Self-pay | Admitting: Pharmacy Technician

## 2019-05-19 NOTE — Patient Outreach (Signed)
[facility]) Care Management  05/19/2019  Rebecca [name] 1932-10-26 [id number]   Care coordination call placed to [name] in regards to Humulin 70/30 application.  Spoke to [name] who informed patient was APPROVED 05/14/2019-03/11/2020. [name] informed tracking was in process but she had no additional information.  Successful outreach call placed to patient, HIPAA identifiers verified.  Patient informed she received a call from [name] and they will be delivering 3 vials to her home tomorrow. Discussed refill procedure with patient and informed her that she was on auto refill. Informed her if she ever go down to one vial and had not heard from the company, then she would need to reach out to the company to inquire about the refill. Patient verbalized understanding and confirmed having the phone number.  Will follow up with patient in 3-5 business days to confirm medication was received.  [name] P. Nickholas Goldston, CPhT [facility] [employer]  [phone]

## 2019-05-20 ENCOUNTER — Other Ambulatory Visit: Payer: Self-pay | Admitting: Pharmacy Technician

## 2019-05-20 NOTE — Patient Outreach (Signed)
[facility]) Care Management  05/20/2019  Rebecca [name] Jan 01, 1933 [id number]   Successful call placed to patient regarding patient assistance medication delivery of Humulin 70/30 from [name], HIPAA identifiers verified.   Patient informed she received 3 vials. Discussed refill procedure with patient and she verbalizes understanding. Patient informs she has no other questions or concerns currently as it relates to patient assistance. Patient confirmed having name and number.  Follow up:  Will route note to [facility] RPh Nunzio Cobbs for case closure and will remove myself from care team.  Rebecca [name]. Rebecca [name], CPhT [facility] [employer]  [phone]

## 2019-05-23 ENCOUNTER — Other Ambulatory Visit: Payer: Self-pay | Admitting: Internal Medicine

## 2019-05-28 ENCOUNTER — Ambulatory Visit: Payer: Self-pay | Admitting: Pharmacist

## 2019-05-28 ENCOUNTER — Other Ambulatory Visit: Payer: Self-pay | Admitting: Pharmacist

## 2019-05-28 NOTE — Patient Outreach (Signed)
[facility]) Care Management  05/28/2019  Rebecca [name] 07-24-1932 [id number]   Patient received her medications through patient assistance. She also is no longer available for [facility] because she is not attributed to [facility] according to the All Payers List.  Beecher Mcardle, PharmD, [facility] Clinical Pharmacist [phone]

## 2019-05-31 NOTE — Patient Instructions (Addendum)
  Blood work was ordered.     Medications reviewed and updated.  Changes include :   Gabapentin 300 mg nightly  Your prescription(s) have been submitted to your pharmacy. Please take as directed and contact our office if you believe you are having problem(s) with the medication(s).    Please followup in 6 months

## 2019-05-31 NOTE — Progress Notes (Signed)
Subjective:    Patient ID: Rebecca [name], female    DOB: [birth year], 86 y.o.   MRN: [id number]  HPI The patient is here for follow up of their chronic medical problems, including diabetes, CAD, hypertension, hyperlipidemia, GERD.  She is taking all of her medications as prescribed.    She is exercising regularly - bed exercises.     She has cold feet and sleeps in socks.  One night she had burning sensation on the side of her foot and she stopped wearing socks at night.  The area was darker in color and looks like it had been burned.  She feels her neuropathy.  She wakes up around 3 am and has nerve pain.  She does take the gabapentin nightly.  She thinks it does help.  Her sugars at home are good- 80 this am.  She sometimes has sugars less than 80 in the morning.  The highest this week after supper 177.    Takes fluid pill not daily-about twice a week.  Her leg edema is controlled.   Medications and allergies reviewed with patient and updated if appropriate.  Patient Active Problem List   Diagnosis Date Noted  . Degenerative arthritis of right knee 12/15/2018  . Knee osteoarthritis 09/08/2018  . Pes planus 12/01/2017  . Foot deformity, acquired, left 11/04/2017  . Degenerative disc disease, lumbar 08/01/2016  . Glaucoma 07/23/2016  . Numbness 04/23/2016  . Chronic pain of right knee 12/28/2015  . Lower back pain 10/24/2015  . Pain in both feet 10/24/2015  . Abnormality of gait 04/01/2013  . Posterior tibial tendon dysfunction 04/01/2013  . Diabetes ([place]) 04/01/2013  . Ankle pain, left 10/20/2010  . UNSPECIFIED VITAMIN D DEFICIENCY 11/08/2008  . EDEMA- LOCALIZED 11/08/2008  . Sickle-cell trait ([place]) 01/01/2008  . Esophageal reflux 09/02/2007  . GOITER, MULTINODULAR 11/26/2006  . Coronary atherosclerosis 11/26/2006  . HIATAL HERNIA 11/26/2006  . FIBROCYSTIC BREAST DISEASE 07/31/2006  . HYPERCHOLESTEROLEMIA 05/27/2006  . Essential hypertension 05/27/2006     Current Outpatient Medications on File Prior to Visit  Medication Sig Dispense Refill  . Alcohol Swabs PADS Use as needed to clean puncture site. 100 each 1  . amLODipine (NORVASC) 10 MG tablet TAKE 1 TABLET BY MOUTH  DAILY 90 tablet 1  . Ascorbic Acid (VITAMIN C) 500 MG tablet Take 500 mg by mouth 2 (two) times daily.      Marland Kitchen aspirin 81 MG tablet Take 81 mg by mouth daily.      Marland Kitchen atorvastatin (LIPITOR) 10 MG tablet TAKE 1 TABLET BY MOUTH  DAILY AT 6 PM 90 tablet 1  . blood glucose meter kit and supplies KIT Dispense based on patient and insurance preference. Use up to four times daily as directed. (FOR ICD-9 250.00, 250.01). 1 each 0  . Blood Glucose Monitoring Suppl (ONE TOUCH ULTRA 2) w/Device KIT Use to check blood sugars twice a day Dx e11.9 1 kit 0  . brimonidine (ALPHAGAN) 0.2 % ophthalmic solution Place 1 drop into the right eye 2 (two) times daily.    . calcium carbonate (OS-CAL) 600 MG TABS Take 600 mg by mouth daily.     Marland Kitchen Cod Liver Oil 1000 MG CAPS Take 1,000 mg by mouth daily. During the winter    . DORZOLAMIDE HCL-TIMOLOL MAL OP Place 1 drop into the right eye 2 (two) times daily.    Marland Kitchen gabapentin (NEURONTIN) 100 MG capsule TAKE 2 CAPSULES BY MOUTH AT BEDTIME 180 capsule 0  .  hydrochlorothiazide (MICROZIDE) 12.5 MG capsule TAKE 1 CAPSULE BY MOUTH  DAILY AS NEEDED 90 capsule 1  . insulin NPH-regular Human (HUMULIN 70/30) (70-30) 100 UNIT/ML injection Inject 15 units in the morning and 7-10 units in the evening 10 mL 3  . Insulin Syringe-Needle U-100 (RELION INSULIN SYR 0.3ML/31G) 31G X 5/16" 0.3 ML MISC USE  TWICE DAILY TO INJECT INSULIN 200 each 1  . isosorbide mononitrate (IMDUR) 30 MG 24 hr tablet TAKE 1 TABLET BY MOUTH  DAILY 90 tablet 1  . latanoprost (XALATAN) 0.005 % ophthalmic solution 1 drop at bedtime.    . Multiple Vitamin (MULTIVITAMIN) tablet Take 1 tablet by mouth daily. Prn    . nitroGLYCERIN (NITROSTAT) 0.4 MG SL tablet DISSOLVE ONE TABLET UNDER TONGUE AS DIRECTED  FOR CHEST PAIN 25 tablet 5  . omeprazole (PRILOSEC) 20 MG capsule TAKE 1 CAPSULE BY MOUTH IN  THE MORNING 90 capsule 1  . OneTouch Delica Lancets 94B MISC Use to check blood sugars 2 times daily. E11.9 400 each 3  . ONETOUCH ULTRA test strip USE 1 STRIP TO CHECK GLUCOSE TWICE DAILY 400 each 3  . quinapril (ACCUPRIL) 40 MG tablet TAKE 1 TABLET BY MOUTH  DAILY 90 tablet 1  . Trolamine Salicylate (ASPERCREME) 10 % LOTN Use twice daily as needed for right knee pain.     No current facility-administered medications on file prior to visit.    Past Medical History:  Diagnosis Date  . Anemia, unspecified   . Ankle pain, left   . CAD (coronary artery disease)   . Esophageal reflux   . Fibrocystic breast disease   . Hiatal hernia   . Hypercholesteremia   . Hypertension   . IDDM (insulin dependent diabetes mellitus)    Type II  . Multinodular goiter   . Sickle-cell trait [facility])     Past Surgical History:  Procedure Laterality Date  . "FEMALE"  1976  . BACK SURGERY  1990  . BIOPSY THYROID     Dr.Ellison-Neg   . CORONARY ANGIOPLASTY    . FOOT SURGERY  2004  . RETINAL LASER PROCEDURE      Social History   Socioeconomic History  . Marital status: Widowed    Spouse name: Not on file  . Number of children: 1  . Years of education: Not on file  . Highest education level: Not on file  Occupational History  . Occupation: retired  Tobacco Use  . Smoking status: Never Smoker  . Smokeless tobacco: Never Used  Substance and Sexual Activity  . Alcohol use: No  . Drug use: No  . Sexual activity: Never  Other Topics Concern  . Not on file  Social History Narrative   Widowed   Gets regular exercise   Supportive church network   Receives daily meals on wheels   [employer] monthly grocery delivery      Social Determinants of Health   Financial Resource Strain:   . Difficulty of Paying Living Expenses:   Food Insecurity:   . Worried About Charity fundraiser in the Last Year:   .  Arboriculturist in the Last Year:   Transportation Needs:   . Film/video editor (Medical):   Marland Kitchen Lack of Transportation (Non-Medical):   Physical Activity: Unknown  . Days of Exercise per Week: 2 days  . Minutes of Exercise per Session: Not asked  Stress:   . Feeling of Stress :   Social Connections:   . Frequency of  Communication with Friends and Family:   . Frequency of Social Gatherings with Friends and Family:   . Attends Religious Services:   . Active Member of Clubs or Organizations:   . Attends Archivist Meetings:   Marland Kitchen Marital Status:     Family History  Problem Relation Age of Onset  . Hypertension Other   . Lung cancer Other   . Heart disease Other   . Gout Sister   . Gout Brother   . Heart attack Father   . Heart attack Mother   . Gout Brother     Review of Systems  Constitutional: Negative for fever.  Respiratory: Positive for cough (occ) and wheezing (rare). Negative for shortness of breath.   Cardiovascular: Positive for leg swelling. Negative for chest pain and palpitations.  Musculoskeletal: Positive for arthralgias.  Neurological: Positive for numbness (fingers and feet). Negative for dizziness, light-headedness and headaches.       Objective:   Vitals:   06/01/19 0950  BP: (!) 128/58  Pulse: (!) 58  Resp: 18  Temp: 97.8 F (36.6 C)  SpO2: 98%   BP Readings from Last 3 Encounters:  06/01/19 (!) 128/58  01/26/19 (!) 110/58  12/15/18 (!) 110/50   Wt Readings from Last 3 Encounters:  06/01/19 211 lb (95.7 kg)  01/26/19 206 lb (93.4 kg)  12/15/18 203 lb (92.1 kg)   Body mass index is 37.38 kg/m.   Physical Exam    Constitutional: Appears well-developed and well-nourished. No distress.  HENT:  Head: Normocephalic and atraumatic.  Neck: Neck supple. No tracheal deviation present. No thyromegaly present.  No cervical lymphadenopathy Cardiovascular: Normal rate, regular rhythm and normal heart sounds.   No murmur heard. No  carotid bruit .  No edema Pulmonary/Chest: Effort normal and breath sounds normal. No respiratory distress. No has no wheezes. No rales.  Skin: Skin is warm and dry. Not diaphoretic.  Skin on the right foot lateral aspect near fifth MTP joint is hyperpigmented and tender to palpation, no fluctuance, no blister, no open wound Psychiatric: Normal mood and affect. Behavior is normal.      Assessment & Plan:    See Problem List for Assessment and Plan of chronic medical problems.    This visit occurred during the SARS-CoV-2 public health emergency.  Safety protocols were in place, including screening questions prior to the visit, additional usage of staff PPE, and extensive cleaning of exam room while observing appropriate contact time as indicated for disinfecting solutions.

## 2019-06-01 ENCOUNTER — Encounter: Payer: Self-pay | Admitting: Internal Medicine

## 2019-06-01 ENCOUNTER — Ambulatory Visit (INDEPENDENT_AMBULATORY_CARE_PROVIDER_SITE_OTHER): Payer: Medicare Other | Admitting: Internal Medicine

## 2019-06-01 ENCOUNTER — Other Ambulatory Visit: Payer: Self-pay

## 2019-06-01 VITALS — BP 128/58 | HR 58 | Temp 97.8°F | Resp 18 | Ht 63.0 in | Wt 211.0 lb

## 2019-06-01 DIAGNOSIS — Z794 Long term (current) use of insulin: Secondary | ICD-10-CM | POA: Diagnosis not present

## 2019-06-01 DIAGNOSIS — K219 Gastro-esophageal reflux disease without esophagitis: Secondary | ICD-10-CM

## 2019-06-01 DIAGNOSIS — I2510 Atherosclerotic heart disease of native coronary artery without angina pectoris: Secondary | ICD-10-CM

## 2019-06-01 DIAGNOSIS — E1142 Type 2 diabetes mellitus with diabetic polyneuropathy: Secondary | ICD-10-CM

## 2019-06-01 DIAGNOSIS — I10 Essential (primary) hypertension: Secondary | ICD-10-CM

## 2019-06-01 DIAGNOSIS — E7800 Pure hypercholesterolemia, unspecified: Secondary | ICD-10-CM | POA: Diagnosis not present

## 2019-06-01 DIAGNOSIS — G629 Polyneuropathy, unspecified: Secondary | ICD-10-CM | POA: Insufficient documentation

## 2019-06-01 DIAGNOSIS — M159 Polyosteoarthritis, unspecified: Secondary | ICD-10-CM | POA: Diagnosis not present

## 2019-06-01 DIAGNOSIS — L89891 Pressure ulcer of other site, stage 1: Secondary | ICD-10-CM

## 2019-06-01 DIAGNOSIS — L8990 Pressure ulcer of unspecified site, unspecified stage: Secondary | ICD-10-CM | POA: Insufficient documentation

## 2019-06-01 DIAGNOSIS — G6289 Other specified polyneuropathies: Secondary | ICD-10-CM

## 2019-06-01 LAB — COMPREHENSIVE METABOLIC PANEL
ALT: 11 U/L (ref 0–35)
AST: 19 U/L (ref 0–37)
Albumin: 3.7 g/dL (ref 3.5–5.2)
Alkaline Phosphatase: 52 U/L (ref 39–117)
BUN: 33 mg/dL — ABNORMAL HIGH (ref 6–23)
CO2: 25 mEq/L (ref 19–32)
Calcium: 9.5 mg/dL (ref 8.4–10.5)
Chloride: 104 mEq/L (ref 96–112)
Creatinine, Ser: 1.2 mg/dL (ref 0.40–1.20)
GFR: 51.67 mL/min — ABNORMAL LOW (ref >60.00)
Glucose, Bld: 103 mg/dL — ABNORMAL HIGH (ref 70–99)
Potassium: 3.9 mEq/L (ref 3.5–5.1)
Sodium: 140 mEq/L (ref 135–145)
Total Bilirubin: 0.5 mg/dL (ref 0.2–1.2)
Total Protein: 6.9 g/dL (ref 6.0–8.3)

## 2019-06-01 LAB — LIPID PANEL
Cholesterol: 191 mg/dL (ref 0–200)
HDL: 91 mg/dL (ref >39.00)
LDL Cholesterol: 90 mg/dL (ref 0–99)
NonHDL: 100.34
Total CHOL/HDL Ratio: 2
Triglycerides: 51 mg/dL (ref 0.0–149.0)
VLDL: 10.2 mg/dL (ref 0.0–40.0)

## 2019-06-01 LAB — CBC WITH DIFFERENTIAL/PLATELET
Basophils Absolute: 0.1 10*3/uL (ref 0.0–0.1)
Basophils Relative: 1.4 % (ref 0.0–3.0)
Eosinophils Absolute: 0.2 10*3/uL (ref 0.0–0.7)
Eosinophils Relative: 4.2 % (ref 0.0–5.0)
HCT: 34.4 % — ABNORMAL LOW (ref 36.0–46.0)
Hemoglobin: 11.1 g/dL — ABNORMAL LOW (ref 12.0–15.0)
Lymphocytes Relative: 35.2 % (ref 12.0–46.0)
Lymphs Abs: 1.9 10*3/uL (ref 0.7–4.0)
MCHC: 32.3 g/dL (ref 30.0–36.0)
MCV: 81.5 fl (ref 78.0–100.0)
Monocytes Absolute: 0.7 10*3/uL (ref 0.1–1.0)
Monocytes Relative: 12.5 % — ABNORMAL HIGH (ref 3.0–12.0)
Neutro Abs: 2.5 10*3/uL (ref 1.4–7.7)
Neutrophils Relative %: 46.7 % (ref 43.0–77.0)
Platelets: 206 10*3/uL (ref 150.0–400.0)
RBC: 4.22 Mil/uL (ref 3.87–5.11)
RDW: 17.3 % — ABNORMAL HIGH (ref 11.5–15.5)
WBC: 5.3 10*3/uL (ref 4.0–10.5)

## 2019-06-01 LAB — SEDIMENTATION RATE: Sed Rate: 39 mm/hr — ABNORMAL HIGH (ref 0–30)

## 2019-06-01 LAB — C-REACTIVE PROTEIN: CRP: <1 mg/dL (ref 0.5–20.0)

## 2019-06-01 LAB — HEMOGLOBIN A1C: Hgb A1c MFr Bld: 7.4 % — ABNORMAL HIGH (ref 4.6–6.5)

## 2019-06-01 MED ORDER — ONETOUCH ULTRA VI STRP: ORAL_STRIP | 3 refills | Status: DC

## 2019-06-01 MED ORDER — GABAPENTIN 100 MG PO CAPS: 300.0000 mg | ORAL_CAPSULE | Freq: Every day | ORAL | 1 refills | Status: DC

## 2019-06-01 NOTE — Assessment & Plan Note (Addendum)
Right lateral foot near 5th mtp joint Stage 1 Discussed etiology of this-related to pressure on the area for prolonged period of time Okay to apply Vaseline Discussed the only way to improve this and prevent it from getting worse is to avoid pressure-discussed a few ways to do that Advised her that she needs to monitor closely and call if there is no improvement or if he gets any worse

## 2019-06-01 NOTE — Assessment & Plan Note (Signed)
Chronic BP well controlled-slightly on the lower side, but she is asymptomatic Current regimen effective and well tolerated Continue current medications at current doses Advised that if she experiences lightheadedness with standing she should let me know cmp

## 2019-06-01 NOTE — Assessment & Plan Note (Signed)
Chronic GERD controlled Continue daily medication-omeprazole 20 mg daily  

## 2019-06-01 NOTE — Assessment & Plan Note (Signed)
NotChronic Pain in both feet and hands Pain is not ideally controlled Discussed possibly increasing gabapentin to 300 mg daily at night to see if she tolerates this.  Discussed possible side effects Discussed we can increase this further if tolerated and needed She will try the higher dose and let me know if she has any concerns

## 2019-06-01 NOTE — Assessment & Plan Note (Signed)
Has generalized arthritis of her hands-likely osteoarthritis She does not have any significant pain, but weakness She does try to do some exercises at home She is interested in seeing a rheumatologist Discussed getting blood work to rule out an autoimmune arthritis Discussed possible physical therapy-she would prefer to do exercises at home but she does not want to pay the co-pay Discussed conservative treatment if this is osteoarthritis We will hold off on rheumatology referral-we will see what the blood work shows

## 2019-06-01 NOTE — Assessment & Plan Note (Signed)
Chronic Sugars sound to be well controlled at home-possibly too well controlled Discussed with her that her sugars do not need to be and should not ideally be less than 100 Discussed decreasing the evening dose based on what her sugar is at night-she may not need to take 10 units and she takes 7 units or less Advised her to call with any questions or concerns Check A1c Continue insulin

## 2019-06-01 NOTE — Assessment & Plan Note (Signed)
Chronic Following with cardiology No symptoms suggestive of angina Continue current medications CBC, CMP, lipids

## 2019-06-01 NOTE — Assessment & Plan Note (Signed)
Chronic Check lipid panel  Continue daily statin Regular exercise and healthy diet encouraged  

## 2019-06-02 LAB — ANTI-NUCLEAR AB-TITER (ANA TITER): ANA Titer 1: 1:40 {titer} — ABNORMAL HIGH

## 2019-06-02 LAB — ANA: Anti Nuclear Antibody (ANA): POSITIVE — AB

## 2019-06-02 LAB — RHEUMATOID FACTOR: Rheumatoid fact SerPl-aCnc: <14 IU/mL (ref <14)

## 2019-06-26 ENCOUNTER — Telehealth: Payer: Self-pay | Admitting: Internal Medicine

## 2019-06-26 DIAGNOSIS — I739 Peripheral vascular disease, unspecified: Secondary | ICD-10-CM

## 2019-06-26 NOTE — Telephone Encounter (Signed)
   Rebecca [name] from [facility] calls, calling to report PAD screening: Right foot normal 1.10 Left foot moderate 0.77  Phone [phone]

## 2019-06-28 NOTE — Telephone Encounter (Signed)
She screening shows she may have some arterial disease in the left leg.  I can refer her to a vascular specialist for further evaluation if she would like.  The symptoms of concern would be pain in the legs when walking that improve with rest

## 2019-06-29 NOTE — Telephone Encounter (Signed)
Pt aware of response below. Will do the referral.

## 2019-07-08 ENCOUNTER — Encounter (INDEPENDENT_AMBULATORY_CARE_PROVIDER_SITE_OTHER): Payer: Medicare Other | Admitting: Ophthalmology

## 2019-07-08 DIAGNOSIS — I10 Essential (primary) hypertension: Secondary | ICD-10-CM

## 2019-07-08 DIAGNOSIS — E113593 Type 2 diabetes mellitus with proliferative diabetic retinopathy without macular edema, bilateral: Secondary | ICD-10-CM

## 2019-07-08 DIAGNOSIS — E11319 Type 2 diabetes mellitus with unspecified diabetic retinopathy without macular edema: Secondary | ICD-10-CM | POA: Diagnosis not present

## 2019-07-08 DIAGNOSIS — H35033 Hypertensive retinopathy, bilateral: Secondary | ICD-10-CM | POA: Diagnosis not present

## 2019-07-08 DIAGNOSIS — H43813 Vitreous degeneration, bilateral: Secondary | ICD-10-CM

## 2019-08-19 ENCOUNTER — Telehealth: Payer: Self-pay | Admitting: Internal Medicine

## 2019-08-19 NOTE — Telephone Encounter (Signed)
    Patient requesting Dr Lawerance Bach write prescription (shoulder pain) for Naproxen 500mg  to [facility] Patient declined to ask Dr for renewal

## 2019-08-19 NOTE — Telephone Encounter (Signed)
Ideally she should not take this long-term because it is bad for the kidneys.  But she still want a prescription sent.  If so I will send it, but we need to monitor the kidneys closely.

## 2019-08-20 MED ORDER — NAPROXEN 500 MG PO TABS: 500.0000 mg | ORAL_TABLET | Freq: Two times a day (BID) | ORAL | 1 refills | Status: DC | PRN

## 2019-08-20 NOTE — Telephone Encounter (Signed)
prescription sent

## 2019-08-21 ENCOUNTER — Other Ambulatory Visit: Payer: Self-pay | Admitting: *Deleted

## 2019-08-21 DIAGNOSIS — M79672 Pain in left foot: Secondary | ICD-10-CM

## 2019-08-21 DIAGNOSIS — M79671 Pain in right foot: Secondary | ICD-10-CM

## 2019-08-24 ENCOUNTER — Other Ambulatory Visit: Payer: Self-pay

## 2019-08-24 ENCOUNTER — Ambulatory Visit (HOSPITAL_COMMUNITY)
Admission: RE | Admit: 2019-08-24 | Discharge: 2019-08-24 | Disposition: A | Discharge status: Home / Self Care | Payer: Medicare Other | Source: Ambulatory Visit | Attending: Surgery | Admitting: Surgery

## 2019-08-24 ENCOUNTER — Encounter: Payer: Self-pay | Admitting: Surgery

## 2019-08-24 ENCOUNTER — Ambulatory Visit (INDEPENDENT_AMBULATORY_CARE_PROVIDER_SITE_OTHER): Payer: Medicare Other | Admitting: Surgery

## 2019-08-24 VITALS — BP 119/59 | HR 69 | Temp 97.8°F | Resp 20 | Ht 63.0 in | Wt 210.0 lb

## 2019-08-24 DIAGNOSIS — Z961 Presence of intraocular lens: Secondary | ICD-10-CM | POA: Diagnosis not present

## 2019-08-24 DIAGNOSIS — I70209 Unspecified atherosclerosis of native arteries of extremities, unspecified extremity: Secondary | ICD-10-CM

## 2019-08-24 DIAGNOSIS — H04123 Dry eye syndrome of bilateral lacrimal glands: Secondary | ICD-10-CM | POA: Diagnosis not present

## 2019-08-24 DIAGNOSIS — M79672 Pain in left foot: Secondary | ICD-10-CM

## 2019-08-24 DIAGNOSIS — M79671 Pain in right foot: Secondary | ICD-10-CM | POA: Insufficient documentation

## 2019-08-24 DIAGNOSIS — H401132 Primary open-angle glaucoma, bilateral, moderate stage: Secondary | ICD-10-CM | POA: Diagnosis not present

## 2019-08-24 DIAGNOSIS — E113593 Type 2 diabetes mellitus with proliferative diabetic retinopathy without macular edema, bilateral: Secondary | ICD-10-CM | POA: Diagnosis not present

## 2019-08-24 LAB — HM DIABETES EYE EXAM

## 2019-08-24 NOTE — Progress Notes (Signed)
[facility]  Patient name: Rebecca [name] MRN: [id number] DOB: 04-05-1932 Sex: female   REQUESTING PROVIDER:    Dr. Billey Gosling   REASON FOR CONSULT:    Eval for PAD   HISTORY OF PRESENT ILLNESS:   Rebecca [name] is a 86 y.o. female, who is is referred for evaluation of PAD.  According to the patient she had someone come out of the house the told her she had poor circulation.  She denies any history of claudication.  She does not have any open wounds on her feet.  The patient is a longtime diabetic.  She is medically managed for hypertension.  She takes a statin for hypercholesterolemia.  PAST MEDICAL HISTORY    Past Medical History:  Diagnosis Date  . Anemia, unspecified   . Ankle pain, left   . CAD (coronary artery disease)   . Esophageal reflux   . Fibrocystic breast disease   . Hiatal hernia   . Hypercholesteremia   . Hypertension   . IDDM (insulin dependent diabetes mellitus)    Type II  . Multinodular goiter   . Sickle-cell trait ([place])      FAMILY HISTORY   Family History  Problem Relation Age of Onset  . Hypertension Other   . Lung cancer Other   . Heart disease Other   . Gout Sister   . Gout Brother   . Heart attack Father   . Heart attack Mother   . Gout Brother     SOCIAL HISTORY:   Social History   Socioeconomic History  . Marital status: Widowed    Spouse name: Not on file  . Number of children: 1  . Years of education: Not on file  . Highest education level: Not on file  Occupational History  . Occupation: retired  Tobacco Use  . Smoking status: Never Smoker  . Smokeless tobacco: Never Used  Vaping Use  . Vaping Use: Never used  Substance and Sexual Activity  . Alcohol use: No  . Drug use: No  . Sexual activity: Never  Other Topics Concern  . Not on file  Social History Narrative   Widowed   Gets regular exercise   Supportive church network    Receives daily meals on wheels   [employer] monthly grocery delivery      Social Determinants of Health   Financial Resource Strain:   . Difficulty of Paying Living Expenses:   Food Insecurity:   . Worried About Charity fundraiser in the Last Year:   . Arboriculturist in the Last Year:   Transportation Needs:   . Film/video editor (Medical):   Marland Kitchen Lack of Transportation (Non-Medical):   Physical Activity: Unknown  . Days of Exercise per Week: 2 days  . Minutes of Exercise per Session: Not asked  Stress:   . Feeling of Stress :   Social Connections:   . Frequency of Communication with Friends and Family:   . Frequency of Social Gatherings with Friends and Family:   . Attends Religious Services:   . Active Member of Clubs or Organizations:   . Attends Archivist Meetings:   Marland Kitchen Marital Status:   Intimate Partner Violence:   . Fear of Current or Ex-Partner:   . Emotionally Abused:   Marland Kitchen Physically Abused:   . Sexually Abused:     ALLERGIES:    Allergies  Allergen Reactions  . Codeine     "  Makes me drunk"    CURRENT MEDICATIONS:    Current Outpatient Medications  Medication Sig Dispense Refill  . Alcohol Swabs PADS Use as needed to clean puncture site. 100 each 1  . amLODipine (NORVASC) 10 MG tablet TAKE 1 TABLET BY MOUTH  DAILY 90 tablet 1  . Ascorbic Acid (VITAMIN C) 500 MG tablet Take 500 mg by mouth 2 (two) times daily.      Marland Kitchen aspirin 81 MG tablet Take 81 mg by mouth daily.      Marland Kitchen atorvastatin (LIPITOR) 10 MG tablet TAKE 1 TABLET BY MOUTH  DAILY AT 6 PM 90 tablet 1  . blood glucose meter kit and supplies KIT Dispense based on patient and insurance preference. Use up to four times daily as directed. (FOR ICD-9 250.00, 250.01). 1 each 0  . Blood Glucose Monitoring Suppl (ONE TOUCH ULTRA 2) w/Device KIT Use to check blood sugars twice a day Dx e11.9 1 kit 0  . brimonidine (ALPHAGAN) 0.2 % ophthalmic solution Place 1 drop into the right eye 2 (two) times  daily.    . calcium carbonate (OS-CAL) 600 MG TABS Take 600 mg by mouth daily.     Marland Kitchen Cod Liver Oil 1000 MG CAPS Take 1,000 mg by mouth daily. During the winter    . DORZOLAMIDE HCL-TIMOLOL MAL OP Place 1 drop into the right eye 2 (two) times daily.    Marland Kitchen gabapentin (NEURONTIN) 100 MG capsule Take 3 capsules (300 mg total) by mouth at bedtime. 270 capsule 1  . hydrochlorothiazide (MICROZIDE) 12.5 MG capsule TAKE 1 CAPSULE BY MOUTH  DAILY AS NEEDED 90 capsule 1  . insulin NPH-regular Human (HUMULIN 70/30) (70-30) 100 UNIT/ML injection Inject 15 units in the morning and 7-10 units in the evening 10 mL 3  . Insulin Syringe-Needle U-100 (RELION INSULIN SYR 0.3ML/31G) 31G X 5/16" 0.3 ML MISC USE  TWICE DAILY TO INJECT INSULIN 200 each 1  . isosorbide mononitrate (IMDUR) 30 MG 24 hr tablet TAKE 1 TABLET BY MOUTH  DAILY 90 tablet 1  . latanoprost (XALATAN) 0.005 % ophthalmic solution 1 drop at bedtime.    . Multiple Vitamin (MULTIVITAMIN) tablet Take 1 tablet by mouth daily. Prn    . naproxen (NAPROSYN) 500 MG tablet Take 1 tablet (500 mg total) by mouth 2 (two) times daily as needed for moderate pain. 180 tablet 1  . nitroGLYCERIN (NITROSTAT) 0.4 MG SL tablet DISSOLVE ONE TABLET UNDER TONGUE AS DIRECTED FOR CHEST PAIN 25 tablet 5  . omeprazole (PRILOSEC) 20 MG capsule TAKE 1 CAPSULE BY MOUTH IN  THE MORNING 90 capsule 1  . OneTouch Delica Lancets 54S MISC Use to check blood sugars 2 times daily. E11.9 400 each 3  . ONETOUCH ULTRA test strip USE 1 STRIP TO CHECK GLUCOSE TWICE DAILY 400 each 3  . quinapril (ACCUPRIL) 40 MG tablet TAKE 1 TABLET BY MOUTH  DAILY 90 tablet 1  . Trolamine Salicylate (ASPERCREME) 10 % LOTN Use twice daily as needed for right knee pain.     No current facility-administered medications for this visit.    REVIEW OF SYSTEMS:   _0  denotes positive finding, _1  denotes negative finding Cardiac  Comments:  Chest pain or chest pressure:    Shortness of breath upon exertion: x     Short of breath when lying flat:    Irregular heart rhythm:        Vascular    Pain in calf, thigh, or hip brought on by  ambulation:    Pain in feet at night that wakes you up from your sleep:     Blood clot in your veins:    Leg swelling:  x       Pulmonary    Oxygen at home:    Productive cough:     Wheezing:         Neurologic    Sudden weakness in arms or legs:     Sudden numbness in arms or legs:     Sudden onset of difficulty speaking or slurred speech:    Temporary loss of vision in one eye:     Problems with dizziness:         Gastrointestinal    Blood in stool:      Vomited blood:         Genitourinary    Burning when urinating:     Blood in urine:        Psychiatric    Major depression:         Hematologic    Bleeding problems:    Problems with blood clotting too easily:        Skin    Rashes or ulcers:        Constitutional    Fever or chills:     PHYSICAL EXAM:   Vitals:   08/24/19 1447  BP: (!) 119/59  Pulse: 69  Resp: 20  Temp: 97.8 F (36.6 C)  SpO2: 96%  Weight: 210 lb (95.3 kg)  Height: _0  (1.6 m)    GENERAL: The patient is a well-nourished female, in no acute distress. The vital signs are documented above. CARDIAC: There is a regular rate and rhythm.  VASCULAR: Palpable dorsalis pedis pulse bilaterally.  2+ bilateral pitting edema PULMONARY: Nonlabored respirations MUSCULOSKELETAL: There are no major deformities or cyanosis. NEUROLOGIC: No focal weakness or paresthesias are detected. SKIN: There are no ulcers or rashes noted. PSYCHIATRIC: The patient has a normal affect.  STUDIES:   I have reviewed the following studies: ABI Findings:  +---------+------------------+-----+---------+--------+  Right  Rt Pressure (mmHg)IndexWaveform Comment   +---------+------------------+-----+---------+--------+  Brachial 149                      +---------+------------------+-----+---------+--------+  PTA    255        1.62 triphasic      +---------+------------------+-----+---------+--------+  DP    255        1.62 triphasic      +---------+------------------+-----+---------+--------+  Great Toe255        1.62            +---------+------------------+-----+---------+--------+   +---------+------------------+-----+---------+-------+  Left   Lt Pressure (mmHg)IndexWaveform Comment  +---------+------------------+-----+---------+-------+  Brachial 157                      +---------+------------------+-----+---------+-------+  PTA   255        1.62 triphasic      +---------+------------------+-----+---------+-------+  DP    255        1.62 triphasic      +---------+------------------+-----+---------+-------+  Great Toe176        1.12            +---------+------------------+-----+---------+-------+   +-------+-----------+-----------+------------+------------+  ABI/TBIToday's ABIToday's TBIPrevious ABIPrevious TBI  +-------+-----------+-----------+------------+------------+  Right [place]     [place]                   +-------+-----------+-----------+------------+------------+  Left    1.12                  +-------+-----------+-----------+------------+------------+   ASSESSMENT and PLAN   Atherosclerotic vascular disease: The patient's arteries are heavily calcified as evidenced by noncompressible ABIs however she has triphasic waveforms at her ankle and normal toe pressures.  Therefore I do not think that she has any evidence of arterial insufficiency in either extremity.  Lower extremity edema: She has pitting edema bilaterally.  I have encouraged her to keep her legs elevated at all possible times.  We also talked about getting compression stockings.  She is interested in a girdle type.  I  think she would be best treated with 15-20 compression.  She was here with her daughter and I talked with them about how to obtain compression socks.  Patient knows to contact me if she has any further questions, otherwise I will see her on as-needed basis.   Leia Alf, MD, FACS [facility] [phone] Pager [phone]

## 2019-09-02 ENCOUNTER — Encounter: Payer: Self-pay | Admitting: Internal Medicine

## 2019-09-03 ENCOUNTER — Encounter: Payer: Self-pay | Admitting: Internal Medicine

## 2019-09-03 NOTE — Progress Notes (Signed)
Outside notes received. Information abstracted. Notes sent to scan.  

## 2019-09-23 ENCOUNTER — Other Ambulatory Visit: Payer: Self-pay | Admitting: Internal Medicine

## 2019-10-05 ENCOUNTER — Other Ambulatory Visit: Payer: Self-pay | Admitting: Pharmacy Technician

## 2019-10-05 NOTE — Patient Outreach (Signed)
[facility]) Care Management  10/05/2019  Rebecca [name] June 17, 1932 [id number]  Return call placed to patient in regards to voicemail message she left for me.  Unfortunately patient did not answer the phone and no voicemail message picked up after approximately 10 rings.  Will await a call back from patient.  Travian Kerner P. Mayerly Kaman, CPhT [facility]  [phone]

## 2019-11-13 ENCOUNTER — Other Ambulatory Visit: Payer: Self-pay | Admitting: Internal Medicine

## 2019-11-19 ENCOUNTER — Other Ambulatory Visit: Payer: Self-pay | Admitting: Internal Medicine

## 2019-11-29 NOTE — Patient Instructions (Addendum)
  Blood work was ordered.     Medications reviewed and updated.  Changes include :   none  Your prescription(s) have been submitted to your pharmacy. Please take as directed and contact our office if you believe you are having problem(s) with the medication(s).   Please followup in 6 months   

## 2019-11-29 NOTE — Progress Notes (Signed)
Subjective:    Patient ID: Rebecca [name], female    DOB: 1932-12-20, 86 y.o.   MRN: [id number]  HPI The patient is here for follow up of their chronic medical problems, including DM, CAD, htn, hyperlipidemia, GERD  She is taking all of her medications as prescribed.   She does some bed exercises.  Lowest sugar was 88.  She is typically compliant with a low sugar diet.   Takes her fluid pill 0-twice a week prn for swelling.  This keeps it controlled.  Medications and allergies reviewed with patient and updated if appropriate.  Patient Active Problem List   Diagnosis Date Noted  . Generalized osteoarthritis of hand 06/01/2019  . Pressure sore, right foot 06/01/2019  . Peripheral neuropathy 06/01/2019  . Degenerative arthritis of right knee 12/15/2018  . Knee osteoarthritis 09/08/2018  . Pes planus 12/01/2017  . Foot deformity, acquired, left 11/04/2017  . Degenerative disc disease, lumbar 08/01/2016  . Glaucoma 07/23/2016  . Numbness 04/23/2016  . Chronic pain of right knee 12/28/2015  . Lower back pain 10/24/2015  . Pain in both feet 10/24/2015  . Abnormality of gait 04/01/2013  . Posterior tibial tendon dysfunction 04/01/2013  . Diabetes ([place]) 04/01/2013  . Ankle pain, left 10/20/2010  . UNSPECIFIED VITAMIN D DEFICIENCY 11/08/2008  . EDEMA- LOCALIZED 11/08/2008  . Sickle-cell trait ([place]) 01/01/2008  . Esophageal reflux 09/02/2007  . GOITER, MULTINODULAR 11/26/2006  . Coronary atherosclerosis 11/26/2006  . HIATAL HERNIA 11/26/2006  . FIBROCYSTIC BREAST DISEASE 07/31/2006  . HYPERCHOLESTEROLEMIA 05/27/2006  . Essential hypertension 05/27/2006    Current Outpatient Medications on File Prior to Visit  Medication Sig Dispense Refill  . Alcohol Swabs PADS Use as needed to clean puncture site. 100 each 1  . amLODipine (NORVASC) 10 MG tablet TAKE 1 TABLET BY MOUTH  DAILY 90 tablet 3  . Ascorbic Acid (VITAMIN C) 500 MG tablet Take 500 mg by mouth 2 (two) times  daily.      Marland Kitchen aspirin 81 MG tablet Take 81 mg by mouth daily.      Marland Kitchen atorvastatin (LIPITOR) 10 MG tablet TAKE 1 TABLET BY MOUTH  DAILY AT 6 PM 90 tablet 1  . BD VEO INSULIN SYRINGE U/F 31G X 15/64" 0.3 ML MISC USE TWICE DAILY TO INJECT  INSULIN 40 each 2  . blood glucose meter kit and supplies KIT Dispense based on patient and insurance preference. Use up to four times daily as directed. (FOR ICD-9 250.00, 250.01). 1 each 0  . Blood Glucose Monitoring Suppl (ONE TOUCH ULTRA 2) w/Device KIT Use to check blood sugars twice a day Dx e11.9 1 kit 0  . brimonidine (ALPHAGAN) 0.2 % ophthalmic solution Place 1 drop into the right eye 2 (two) times daily.    . calcium carbonate (OS-CAL) 600 MG TABS Take 600 mg by mouth daily.     Marland Kitchen Cod Liver Oil 1000 MG CAPS Take 1,000 mg by mouth daily. During the winter    . DORZOLAMIDE HCL-TIMOLOL MAL OP Place 1 drop into the right eye 2 (two) times daily.    Marland Kitchen gabapentin (NEURONTIN) 100 MG capsule TAKE 3 CAPSULES BY MOUTH AT BEDTIME 270 capsule 3  . hydrochlorothiazide (MICROZIDE) 12.5 MG capsule TAKE 1 CAPSULE BY MOUTH  DAILY AS NEEDED 90 capsule 1  . insulin NPH-regular Human (HUMULIN 70/30) (70-30) 100 UNIT/ML injection Inject 15 units in the morning and 7-10 units in the evening 10 mL 3  . isosorbide mononitrate (IMDUR) 30  MG 24 hr tablet TAKE 1 TABLET BY MOUTH  DAILY 90 tablet 1  . latanoprost (XALATAN) 0.005 % ophthalmic solution 1 drop at bedtime.    . Multiple Vitamin (MULTIVITAMIN) tablet Take 1 tablet by mouth daily. Prn    . naproxen (NAPROSYN) 500 MG tablet Take 1 tablet (500 mg total) by mouth 2 (two) times daily as needed for moderate pain. 180 tablet 1  . nitroGLYCERIN (NITROSTAT) 0.4 MG SL tablet DISSOLVE ONE TABLET UNDER TONGUE AS DIRECTED FOR CHEST PAIN 25 tablet 5  . omeprazole (PRILOSEC) 20 MG capsule TAKE 1 CAPSULE BY MOUTH IN  THE MORNING 90 capsule 3  . OneTouch Delica Lancets 40J MISC Use to check blood sugars 2 times daily. E11.9 400 each 3  .  ONETOUCH ULTRA test strip USE 1 STRIP TO CHECK GLUCOSE TWICE DAILY 400 each 3  . quinapril (ACCUPRIL) 40 MG tablet TAKE 1 TABLET BY MOUTH  DAILY 90 tablet 1  . Trolamine Salicylate (ASPERCREME) 10 % LOTN Use twice daily as needed for right knee pain.     No current facility-administered medications on file prior to visit.    Past Medical History:  Diagnosis Date  . Anemia, unspecified   . Ankle pain, left   . CAD (coronary artery disease)   . Esophageal reflux   . Fibrocystic breast disease   . Hiatal hernia   . Hypercholesteremia   . Hypertension   . IDDM (insulin dependent diabetes mellitus)    Type II  . Multinodular goiter   . Sickle-cell trait [facility])     Past Surgical History:  Procedure Laterality Date  . "FEMALE"  1976  . BACK SURGERY  1990  . BIOPSY THYROID     Dr.Ellison-Neg   . CORONARY ANGIOPLASTY    . FOOT SURGERY  2004  . RETINAL LASER PROCEDURE      Social History   Socioeconomic History  . Marital status: Widowed    Spouse name: Not on file  . Number of children: 1  . Years of education: Not on file  . Highest education level: Not on file  Occupational History  . Occupation: retired  Tobacco Use  . Smoking status: Never Smoker  . Smokeless tobacco: Never Used  Vaping Use  . Vaping Use: Never used  Substance and Sexual Activity  . Alcohol use: No  . Drug use: No  . Sexual activity: Never  Other Topics Concern  . Not on file  Social History Narrative   Widowed   Gets regular exercise   Supportive church network   Receives daily meals on wheels   [employer] monthly grocery delivery      Social Determinants of Health   Financial Resource Strain:   . Difficulty of Paying Living Expenses: Not on file  Food Insecurity:   . Worried About Charity fundraiser in the Last Year: Not on file  . Ran Out of Food in the Last Year: Not on file  Transportation Needs:   . Lack of Transportation (Medical): Not on file  . Lack of Transportation  (Non-Medical): Not on file  Physical Activity:   . Days of Exercise per Week: Not on file  . Minutes of Exercise per Session: Not on file  Stress:   . Feeling of Stress : Not on file  Social Connections:   . Frequency of Communication with Friends and Family: Not on file  . Frequency of Social Gatherings with Friends and Family: Not on file  . Attends  Religious Services: Not on file  . Active Member of Clubs or Organizations: Not on file  . Attends Archivist Meetings: Not on file  . Marital Status: Not on file    Family History  Problem Relation Age of Onset  . Hypertension Other   . Lung cancer Other   . Heart disease Other   . Gout Sister   . Gout Brother   . Heart attack Father   . Heart attack Mother   . Gout Brother     Review of Systems  Constitutional: Negative for chills and fever.  Respiratory: Positive for shortness of breath (occ with moderate exertion). Negative for cough and wheezing.   Cardiovascular: Positive for leg swelling. Negative for chest pain and palpitations.  Gastrointestinal:       Jerrye Bushy controlled  Neurological: Positive for light-headedness. Negative for headaches.       Objective:   Vitals:   11/30/19 1307  BP: 132/72  Pulse: (!) 59  Temp: 98.1 F (36.7 C)  SpO2: 97%   BP Readings from Last 3 Encounters:  11/30/19 132/72  08/24/19 (!) 119/59  06/01/19 (!) 128/58   Wt Readings from Last 3 Encounters:  11/30/19 204 lb 12.8 oz (92.9 kg)  08/24/19 210 lb (95.3 kg)  06/01/19 211 lb (95.7 kg)   Body mass index is 36.28 kg/m.   Physical Exam    Constitutional: Appears well-developed and well-nourished. No distress.  HENT:  Head: Normocephalic and atraumatic.  Neck: Neck supple. No tracheal deviation present. No thyromegaly present.  No cervical lymphadenopathy Cardiovascular: Normal rate, regular rhythm and normal heart sounds.   No murmur heard. No carotid bruit .  Mild chronic bilateral lower extremity  edema Pulmonary/Chest: Effort normal and breath sounds normal. No respiratory distress. No has no wheezes. No rales.  Skin: Skin is warm and dry. Not diaphoretic.  Psychiatric: Normal mood and affect. Behavior is normal.      Assessment & Plan:    See Problem List for Assessment and Plan of chronic medical problems.    This visit occurred during the SARS-CoV-2 public health emergency.  Safety protocols were in place, including screening questions prior to the visit, additional usage of staff PPE, and extensive cleaning of exam room while observing appropriate contact time as indicated for disinfecting solutions.

## 2019-11-30 ENCOUNTER — Ambulatory Visit (INDEPENDENT_AMBULATORY_CARE_PROVIDER_SITE_OTHER): Payer: Medicare Other | Admitting: Internal Medicine

## 2019-11-30 ENCOUNTER — Encounter: Payer: Self-pay | Admitting: Internal Medicine

## 2019-11-30 ENCOUNTER — Other Ambulatory Visit: Payer: Self-pay

## 2019-11-30 VITALS — BP 132/72 | HR 59 | Temp 98.1°F | Wt 204.8 lb

## 2019-11-30 DIAGNOSIS — I10 Essential (primary) hypertension: Secondary | ICD-10-CM | POA: Diagnosis not present

## 2019-11-30 DIAGNOSIS — I2510 Atherosclerotic heart disease of native coronary artery without angina pectoris: Secondary | ICD-10-CM | POA: Diagnosis not present

## 2019-11-30 DIAGNOSIS — K219 Gastro-esophageal reflux disease without esophagitis: Secondary | ICD-10-CM

## 2019-11-30 DIAGNOSIS — E1142 Type 2 diabetes mellitus with diabetic polyneuropathy: Secondary | ICD-10-CM | POA: Diagnosis not present

## 2019-11-30 DIAGNOSIS — D649 Anemia, unspecified: Secondary | ICD-10-CM

## 2019-11-30 DIAGNOSIS — Z13820 Encounter for screening for osteoporosis: Secondary | ICD-10-CM

## 2019-11-30 DIAGNOSIS — Z794 Long term (current) use of insulin: Secondary | ICD-10-CM | POA: Diagnosis not present

## 2019-11-30 DIAGNOSIS — E7800 Pure hypercholesterolemia, unspecified: Secondary | ICD-10-CM | POA: Diagnosis not present

## 2019-11-30 MED ORDER — AMLODIPINE BESYLATE 10 MG PO TABS
10.0000 mg | ORAL_TABLET | Freq: Every day | ORAL | 3 refills | Status: DC
Start: 2019-11-30 — End: 2019-11-30

## 2019-11-30 MED ORDER — ATORVASTATIN CALCIUM 10 MG PO TABS
ORAL_TABLET | ORAL | 1 refills | Status: DC
Start: 2019-11-30 — End: 2019-11-30

## 2019-11-30 MED ORDER — BD VEO INSULIN SYRINGE U/F 31G X 15/64" 0.3 ML MISC
3 refills | Status: DC
Start: 2019-11-30 — End: 2019-11-30

## 2019-11-30 MED ORDER — AMLODIPINE BESYLATE 10 MG PO TABS
10.0000 mg | ORAL_TABLET | Freq: Every day | ORAL | 3 refills | Status: DC
Start: 2019-11-30 — End: 2020-01-11

## 2019-11-30 MED ORDER — ATORVASTATIN CALCIUM 10 MG PO TABS
ORAL_TABLET | ORAL | 1 refills | Status: DC
Start: 2019-11-30 — End: 2020-12-06

## 2019-11-30 MED ORDER — BD VEO INSULIN SYRINGE U/F 31G X 15/64" 0.3 ML MISC
3 refills | Status: DC
Start: 2019-11-30 — End: 2020-02-01

## 2019-11-30 NOTE — Assessment & Plan Note (Signed)
Chronic BP well controlled Current regimen effective and well tolerated Continue current medications at current doses cmp  

## 2019-11-30 NOTE — Assessment & Plan Note (Signed)
Chronic GERD controlled Continue daily medication, taking omeprazole OTC

## 2019-11-30 NOTE — Assessment & Plan Note (Signed)
Chronic Following with cardiology No concerning angina-like symptoms Continue current medications

## 2019-11-30 NOTE — Assessment & Plan Note (Signed)
Chronic Sugars found to be controlled at home Check A1c Continue current medication

## 2019-11-30 NOTE — Assessment & Plan Note (Signed)
Chronic Check lipid panel  Continue daily statin As tolerated exercise and healthy diet encouraged

## 2019-12-01 LAB — LIPID PANEL
Cholesterol: 164 mg/dL (ref <200)
HDL: 74 mg/dL (ref >=50)
LDL Cholesterol (Calc): 76 mg/dL (calc)
Non-HDL Cholesterol (Calc): 90 mg/dL (calc) (ref <130)
Total CHOL/HDL Ratio: 2.2 (calc) (ref <5.0)
Triglycerides: 56 mg/dL (ref <150)

## 2019-12-01 LAB — COMPLETE METABOLIC PANEL WITH GFR
AG Ratio: 1.3 (calc) (ref 1.0–2.5)
ALT: 7 U/L (ref 6–29)
AST: 14 U/L (ref 10–35)
Albumin: 3.5 g/dL — ABNORMAL LOW (ref 3.6–5.1)
Alkaline phosphatase (APISO): 50 U/L (ref 37–153)
BUN/Creatinine Ratio: 22 (calc) (ref 6–22)
BUN: 28 mg/dL — ABNORMAL HIGH (ref 7–25)
CO2: 25 mmol/L (ref 20–32)
Calcium: 9.1 mg/dL (ref 8.6–10.4)
Chloride: 108 mmol/L (ref 98–110)
Creat: 1.27 mg/dL — ABNORMAL HIGH (ref 0.60–0.88)
GFR, Est African American: 45 mL/min/{1.73_m2} — ABNORMAL LOW (ref >=60)
GFR, Est Non African American: 38 mL/min/{1.73_m2} — ABNORMAL LOW (ref >=60)
Globulin: 2.7 g/dL (calc) (ref 1.9–3.7)
Glucose, Bld: 108 mg/dL — ABNORMAL HIGH (ref 65–99)
Potassium: 4.5 mmol/L (ref 3.5–5.3)
Sodium: 140 mmol/L (ref 135–146)
Total Bilirubin: 0.4 mg/dL (ref 0.2–1.2)
Total Protein: 6.2 g/dL (ref 6.1–8.1)

## 2019-12-01 LAB — CBC WITH DIFFERENTIAL/PLATELET
Absolute Monocytes: 756 cells/uL (ref 200–950)
Basophils Absolute: 102 cells/uL (ref 0–200)
Basophils Relative: 1.7 %
Eosinophils Absolute: 210 cells/uL (ref 15–500)
Eosinophils Relative: 3.5 %
HCT: 32.3 % — ABNORMAL LOW (ref 35.0–45.0)
Hemoglobin: 10.3 g/dL — ABNORMAL LOW (ref 11.7–15.5)
Lymphs Abs: 1794 cells/uL (ref 850–3900)
MCH: 25.9 pg — ABNORMAL LOW (ref 27.0–33.0)
MCHC: 31.9 g/dL — ABNORMAL LOW (ref 32.0–36.0)
MCV: 81.2 fL (ref 80.0–100.0)
MPV: 11.6 fL (ref 7.5–12.5)
Monocytes Relative: 12.6 %
Neutro Abs: 3138 cells/uL (ref 1500–7800)
Neutrophils Relative %: 52.3 %
Platelets: 237 10*3/uL (ref 140–400)
RBC: 3.98 10*6/uL (ref 3.80–5.10)
RDW: 15.6 % — ABNORMAL HIGH (ref 11.0–15.0)
Total Lymphocyte: 29.9 %
WBC: 6 10*3/uL (ref 3.8–10.8)

## 2019-12-01 LAB — HEMOGLOBIN A1C
Hgb A1c MFr Bld: 6 % of total Hgb — ABNORMAL HIGH (ref <5.7)
Mean Plasma Glucose: 126 (calc)
eAG (mmol/L): 7 (calc)

## 2019-12-02 NOTE — Addendum Note (Signed)
Addended by: Pincus Sanes on: 12/02/2019 07:28 AM   Modules accepted: Orders

## 2019-12-16 ENCOUNTER — Encounter: Payer: Self-pay | Admitting: Internal Medicine

## 2019-12-26 ENCOUNTER — Ambulatory Visit: Payer: Medicare Other | Attending: Internal Medicine

## 2019-12-26 DIAGNOSIS — Z23 Encounter for immunization: Secondary | ICD-10-CM

## 2019-12-26 NOTE — Progress Notes (Signed)
   Covid-19 Vaccination Clinic  Name:  Rebecca [name]    MRN: [id number] DOB: Jul 23, 1932  12/26/2019  [name] was observed post Covid-19 immunization for 15 minutes without incident. She was provided with Vaccine Information Sheet and instruction to access the V-Safe system.   [name] was instructed to call 911 with any severe reactions post vaccine: Marland Kitchen Difficulty breathing  . Swelling of face and throat  . A fast heartbeat  . A bad rash all over body  . Dizziness and weakness

## 2020-01-02 ENCOUNTER — Inpatient Hospital Stay (HOSPITAL_COMMUNITY)
Admission: EM | Admit: 2020-01-02 | Discharge: 2020-01-11 | DRG: 643 | Disposition: A | Discharge status: Skilled Nursing Facility | Payer: Medicare Other | Attending: Internal Medicine | Admitting: Internal Medicine

## 2020-01-02 ENCOUNTER — Emergency Department (HOSPITAL_COMMUNITY): Payer: Medicare Other

## 2020-01-02 ENCOUNTER — Encounter (HOSPITAL_COMMUNITY): Payer: Self-pay | Admitting: Emergency Medicine

## 2020-01-02 ENCOUNTER — Other Ambulatory Visit: Payer: Self-pay

## 2020-01-02 DIAGNOSIS — M4800 Spinal stenosis, site unspecified: Secondary | ICD-10-CM | POA: Diagnosis not present

## 2020-01-02 DIAGNOSIS — J189 Pneumonia, unspecified organism: Secondary | ICD-10-CM | POA: Diagnosis not present

## 2020-01-02 DIAGNOSIS — M7989 Other specified soft tissue disorders: Secondary | ICD-10-CM | POA: Diagnosis not present

## 2020-01-02 DIAGNOSIS — R509 Fever, unspecified: Secondary | ICD-10-CM

## 2020-01-02 DIAGNOSIS — R29898 Other symptoms and signs involving the musculoskeletal system: Secondary | ICD-10-CM | POA: Diagnosis not present

## 2020-01-02 DIAGNOSIS — E8770 Fluid overload, unspecified: Secondary | ICD-10-CM | POA: Diagnosis not present

## 2020-01-02 DIAGNOSIS — J9811 Atelectasis: Secondary | ICD-10-CM | POA: Diagnosis not present

## 2020-01-02 DIAGNOSIS — E1165 Type 2 diabetes mellitus with hyperglycemia: Secondary | ICD-10-CM | POA: Diagnosis not present

## 2020-01-02 DIAGNOSIS — Z801 Family history of malignant neoplasm of trachea, bronchus and lung: Secondary | ICD-10-CM | POA: Diagnosis not present

## 2020-01-02 DIAGNOSIS — D573 Sickle-cell trait: Secondary | ICD-10-CM | POA: Diagnosis present

## 2020-01-02 DIAGNOSIS — E0590 Thyrotoxicosis, unspecified without thyrotoxic crisis or storm: Secondary | ICD-10-CM | POA: Diagnosis not present

## 2020-01-02 DIAGNOSIS — B9620 Unspecified Escherichia coli [E. coli] as the cause of diseases classified elsewhere: Secondary | ICD-10-CM | POA: Diagnosis present

## 2020-01-02 DIAGNOSIS — Z7982 Long term (current) use of aspirin: Secondary | ICD-10-CM | POA: Diagnosis not present

## 2020-01-02 DIAGNOSIS — I313 Pericardial effusion (noninflammatory): Secondary | ICD-10-CM | POA: Diagnosis not present

## 2020-01-02 DIAGNOSIS — R0602 Shortness of breath: Secondary | ICD-10-CM | POA: Diagnosis not present

## 2020-01-02 DIAGNOSIS — R5381 Other malaise: Secondary | ICD-10-CM | POA: Diagnosis not present

## 2020-01-02 DIAGNOSIS — Z79899 Other long term (current) drug therapy: Secondary | ICD-10-CM | POA: Diagnosis not present

## 2020-01-02 DIAGNOSIS — I5030 Unspecified diastolic (congestive) heart failure: Secondary | ICD-10-CM | POA: Diagnosis not present

## 2020-01-02 DIAGNOSIS — Z955 Presence of coronary angioplasty implant and graft: Secondary | ICD-10-CM | POA: Diagnosis not present

## 2020-01-02 DIAGNOSIS — R262 Difficulty in walking, not elsewhere classified: Secondary | ICD-10-CM | POA: Diagnosis present

## 2020-01-02 DIAGNOSIS — Z885 Allergy status to narcotic agent status: Secondary | ICD-10-CM

## 2020-01-02 DIAGNOSIS — M6281 Muscle weakness (generalized): Secondary | ICD-10-CM | POA: Diagnosis not present

## 2020-01-02 DIAGNOSIS — I10 Essential (primary) hypertension: Secondary | ICD-10-CM | POA: Diagnosis not present

## 2020-01-02 DIAGNOSIS — R531 Weakness: Secondary | ICD-10-CM

## 2020-01-02 DIAGNOSIS — I480 Paroxysmal atrial fibrillation: Secondary | ICD-10-CM | POA: Diagnosis present

## 2020-01-02 DIAGNOSIS — K219 Gastro-esophageal reflux disease without esophagitis: Secondary | ICD-10-CM | POA: Diagnosis present

## 2020-01-02 DIAGNOSIS — I739 Peripheral vascular disease, unspecified: Secondary | ICD-10-CM | POA: Diagnosis not present

## 2020-01-02 DIAGNOSIS — I493 Ventricular premature depolarization: Secondary | ICD-10-CM | POA: Diagnosis not present

## 2020-01-02 DIAGNOSIS — M1710 Unilateral primary osteoarthritis, unspecified knee: Secondary | ICD-10-CM | POA: Diagnosis not present

## 2020-01-02 DIAGNOSIS — D509 Iron deficiency anemia, unspecified: Secondary | ICD-10-CM

## 2020-01-02 DIAGNOSIS — M5136 Other intervertebral disc degeneration, lumbar region: Secondary | ICD-10-CM | POA: Diagnosis not present

## 2020-01-02 DIAGNOSIS — Z8639 Personal history of other endocrine, nutritional and metabolic disease: Secondary | ICD-10-CM

## 2020-01-02 DIAGNOSIS — R29818 Other symptoms and signs involving the nervous system: Secondary | ICD-10-CM | POA: Diagnosis not present

## 2020-01-02 DIAGNOSIS — Z743 Need for continuous supervision: Secondary | ICD-10-CM | POA: Diagnosis not present

## 2020-01-02 DIAGNOSIS — Z23 Encounter for immunization: Secondary | ICD-10-CM | POA: Diagnosis not present

## 2020-01-02 DIAGNOSIS — G8929 Other chronic pain: Secondary | ICD-10-CM | POA: Diagnosis not present

## 2020-01-02 DIAGNOSIS — Z8249 Family history of ischemic heart disease and other diseases of the circulatory system: Secondary | ICD-10-CM

## 2020-01-02 DIAGNOSIS — E0591 Thyrotoxicosis, unspecified with thyrotoxic crisis or storm: Principal | ICD-10-CM | POA: Diagnosis present

## 2020-01-02 DIAGNOSIS — J811 Chronic pulmonary edema: Secondary | ICD-10-CM | POA: Diagnosis not present

## 2020-01-02 DIAGNOSIS — R7989 Other specified abnormal findings of blood chemistry: Secondary | ICD-10-CM | POA: Diagnosis not present

## 2020-01-02 DIAGNOSIS — G629 Polyneuropathy, unspecified: Secondary | ICD-10-CM | POA: Diagnosis not present

## 2020-01-02 DIAGNOSIS — M48061 Spinal stenosis, lumbar region without neurogenic claudication: Secondary | ICD-10-CM | POA: Diagnosis present

## 2020-01-02 DIAGNOSIS — R778 Other specified abnormalities of plasma proteins: Secondary | ICD-10-CM

## 2020-01-02 DIAGNOSIS — I130 Hypertensive heart and chronic kidney disease with heart failure and stage 1 through stage 4 chronic kidney disease, or unspecified chronic kidney disease: Secondary | ICD-10-CM | POA: Diagnosis present

## 2020-01-02 DIAGNOSIS — I4891 Unspecified atrial fibrillation: Secondary | ICD-10-CM

## 2020-01-02 DIAGNOSIS — N1831 Chronic kidney disease, stage 3a: Secondary | ICD-10-CM

## 2020-01-02 DIAGNOSIS — E1122 Type 2 diabetes mellitus with diabetic chronic kidney disease: Secondary | ICD-10-CM

## 2020-01-02 DIAGNOSIS — E7800 Pure hypercholesterolemia, unspecified: Secondary | ICD-10-CM | POA: Diagnosis not present

## 2020-01-02 DIAGNOSIS — I2510 Atherosclerotic heart disease of native coronary artery without angina pectoris: Secondary | ICD-10-CM | POA: Diagnosis present

## 2020-01-02 DIAGNOSIS — I69828 Other speech and language deficits following other cerebrovascular disease: Secondary | ICD-10-CM | POA: Diagnosis not present

## 2020-01-02 DIAGNOSIS — R609 Edema, unspecified: Secondary | ICD-10-CM

## 2020-01-02 DIAGNOSIS — R2681 Unsteadiness on feet: Secondary | ICD-10-CM | POA: Diagnosis not present

## 2020-01-02 DIAGNOSIS — E1142 Type 2 diabetes mellitus with diabetic polyneuropathy: Secondary | ICD-10-CM | POA: Diagnosis not present

## 2020-01-02 DIAGNOSIS — I5033 Acute on chronic diastolic (congestive) heart failure: Secondary | ICD-10-CM | POA: Insufficient documentation

## 2020-01-02 DIAGNOSIS — G319 Degenerative disease of nervous system, unspecified: Secondary | ICD-10-CM | POA: Diagnosis not present

## 2020-01-02 DIAGNOSIS — E119 Type 2 diabetes mellitus without complications: Secondary | ICD-10-CM

## 2020-01-02 DIAGNOSIS — I517 Cardiomegaly: Secondary | ICD-10-CM | POA: Diagnosis not present

## 2020-01-02 DIAGNOSIS — I509 Heart failure, unspecified: Secondary | ICD-10-CM

## 2020-01-02 DIAGNOSIS — N390 Urinary tract infection, site not specified: Secondary | ICD-10-CM | POA: Diagnosis present

## 2020-01-02 DIAGNOSIS — M47816 Spondylosis without myelopathy or radiculopathy, lumbar region: Secondary | ICD-10-CM | POA: Diagnosis present

## 2020-01-02 DIAGNOSIS — Z794 Long term (current) use of insulin: Secondary | ICD-10-CM | POA: Diagnosis not present

## 2020-01-02 DIAGNOSIS — Z03818 Encounter for observation for suspected exposure to other biological agents ruled out: Secondary | ICD-10-CM | POA: Diagnosis not present

## 2020-01-02 DIAGNOSIS — Z20822 Contact with and (suspected) exposure to covid-19: Secondary | ICD-10-CM | POA: Diagnosis present

## 2020-01-02 DIAGNOSIS — I129 Hypertensive chronic kidney disease with stage 1 through stage 4 chronic kidney disease, or unspecified chronic kidney disease: Secondary | ICD-10-CM | POA: Diagnosis not present

## 2020-01-02 DIAGNOSIS — M545 Low back pain, unspecified: Secondary | ICD-10-CM | POA: Diagnosis not present

## 2020-01-02 DIAGNOSIS — M2550 Pain in unspecified joint: Secondary | ICD-10-CM | POA: Diagnosis not present

## 2020-01-02 DIAGNOSIS — M1711 Unilateral primary osteoarthritis, right knee: Secondary | ICD-10-CM | POA: Diagnosis not present

## 2020-01-02 DIAGNOSIS — R269 Unspecified abnormalities of gait and mobility: Secondary | ICD-10-CM | POA: Diagnosis not present

## 2020-01-02 DIAGNOSIS — Z7401 Bed confinement status: Secondary | ICD-10-CM | POA: Diagnosis not present

## 2020-01-02 DIAGNOSIS — E1159 Type 2 diabetes mellitus with other circulatory complications: Secondary | ICD-10-CM | POA: Diagnosis present

## 2020-01-02 DIAGNOSIS — R739 Hyperglycemia, unspecified: Secondary | ICD-10-CM

## 2020-01-02 DIAGNOSIS — E785 Hyperlipidemia, unspecified: Secondary | ICD-10-CM | POA: Diagnosis present

## 2020-01-02 DIAGNOSIS — R52 Pain, unspecified: Secondary | ICD-10-CM | POA: Diagnosis not present

## 2020-01-02 DIAGNOSIS — I6782 Cerebral ischemia: Secondary | ICD-10-CM | POA: Diagnosis not present

## 2020-01-02 LAB — CBC WITH DIFFERENTIAL/PLATELET
Abs Immature Granulocytes: 0.03 10*3/uL (ref 0.00–0.07)
Basophils Absolute: 0 10*3/uL (ref 0.0–0.1)
Basophils Relative: 1 %
Eosinophils Absolute: 0 10*3/uL (ref 0.0–0.5)
Eosinophils Relative: 0 %
HCT: 30.6 % — ABNORMAL LOW (ref 36.0–46.0)
Hemoglobin: 9.8 g/dL — ABNORMAL LOW (ref 12.0–15.0)
Immature Granulocytes: 1 %
Lymphocytes Relative: 22 %
Lymphs Abs: 1.4 10*3/uL (ref 0.7–4.0)
MCH: 25.3 pg — ABNORMAL LOW (ref 26.0–34.0)
MCHC: 32 g/dL (ref 30.0–36.0)
MCV: 78.9 fL — ABNORMAL LOW (ref 80.0–100.0)
Monocytes Absolute: 1.2 10*3/uL — ABNORMAL HIGH (ref 0.1–1.0)
Monocytes Relative: 19 %
Neutro Abs: 3.8 10*3/uL (ref 1.7–7.7)
Neutrophils Relative %: 57 %
Platelets: 202 10*3/uL (ref 150–400)
RBC: 3.88 MIL/uL (ref 3.87–5.11)
RDW: 15.5 % (ref 11.5–15.5)
WBC: 6.6 10*3/uL (ref 4.0–10.5)
nRBC: 0 % (ref 0.0–0.2)

## 2020-01-02 LAB — CBG MONITORING, ED: Glucose-Capillary: 261 mg/dL — ABNORMAL HIGH (ref 70–99)

## 2020-01-02 NOTE — ED Provider Notes (Signed)
[facility] DEPT Provider Note   CSN: [id number] Arrival date & time: 01/02/20  2235     History Chief Complaint  Patient presents with  . Hyperglycemia  . Weakness    Rebecca [name] is a 86 y.o. female with a hx of CAD, anemia, high cholesterol, hypertension, type 2 insulin-dependent diabetes, osteoarthritis presents to the Emergency Department complaining of gradual, persistent, progressively worsening neurolysed weakness onset approximately 5 days ago.  Patient reports she lives alone and normally walks with her walker however over the last week has become weaker and weaker, now unable to walk with her walker.  She does not have help at home.  No falls.  She also reports elevated blood glucose over the last several days including up to 350 today.  She denies changes in her diet.  Reports baseline glucose levels 100-150.  She denies fevers, chills, headache, neck pain, chest pain, shortness of breath, abdominal pain, nausea, vomiting, diarrhea, dysuria, hematuria.  She does report some decreased appetite and thus decreased p.o. over the last few days.  No known sick contacts.  Fully vaccinated against Covid with booster in early October.  The history is provided by the patient and medical records. No language interpreter was used.       Past Medical History:  Diagnosis Date  . Anemia, unspecified   . Ankle pain, left   . CAD (coronary artery disease)   . Esophageal reflux   . Fibrocystic breast disease   . Hiatal hernia   . Hypercholesteremia   . Hypertension   . IDDM (insulin dependent diabetes mellitus)    Type II  . Multinodular goiter   . Sickle-cell trait [facility])     Patient Active Problem List   Diagnosis Date Noted  . Generalized osteoarthritis of hand 06/01/2019  . Pressure sore, right foot 06/01/2019  . Peripheral neuropathy 06/01/2019  . Degenerative arthritis of right knee 12/15/2018  . Knee osteoarthritis 09/08/2018  . Pes  planus 12/01/2017  . Foot deformity, acquired, left 11/04/2017  . Degenerative disc disease, lumbar 08/01/2016  . Glaucoma 07/23/2016  . Numbness 04/23/2016  . Chronic pain of right knee 12/28/2015  . Lower back pain 10/24/2015  . Pain in both feet 10/24/2015  . Abnormality of gait 04/01/2013  . Posterior tibial tendon dysfunction 04/01/2013  . Diabetes ([place]) 04/01/2013  . Ankle pain, left 10/20/2010  . UNSPECIFIED VITAMIN D DEFICIENCY 11/08/2008  . EDEMA- LOCALIZED 11/08/2008  . Sickle-cell trait ([place]) 01/01/2008  . Esophageal reflux 09/02/2007  . GOITER, MULTINODULAR 11/26/2006  . Coronary atherosclerosis 11/26/2006  . HIATAL HERNIA 11/26/2006  . FIBROCYSTIC BREAST DISEASE 07/31/2006  . HYPERCHOLESTEROLEMIA 05/27/2006  . Essential hypertension 05/27/2006    Past Surgical History:  Procedure Laterality Date  . "FEMALE"  1976  . BACK SURGERY  1990  . BIOPSY THYROID     Dr.Ellison-Neg   . CORONARY ANGIOPLASTY    . FOOT SURGERY  2004  . RETINAL LASER PROCEDURE       OB History   No obstetric history on file.     Family History  Problem Relation Age of Onset  . Hypertension Other   . Lung cancer Other   . Heart disease Other   . Gout Sister   . Gout Brother   . Heart attack Father   . Heart attack Mother   . Gout Brother     Social History   Tobacco Use  . Smoking status: Never Smoker  . Smokeless tobacco:  Never Used  Vaping Use  . Vaping Use: Never used  Substance Use Topics  . Alcohol use: No  . Drug use: No    Home Medications Prior to Admission medications   Medication Sig Start Date End Date Taking? Authorizing Provider  Alcohol Swabs PADS Use as needed to clean puncture site. 09/08/18  Yes Burns, [name], MD  amLODipine (NORVASC) 10 MG tablet Take 1 tablet (10 mg total) by mouth daily. 11/30/19  Yes Burns, [name], MD  Ascorbic Acid (VITAMIN C) 500 MG tablet Take 500 mg by mouth 2 (two) times daily.     Yes [provider]  aspirin 81 MG  tablet Take 81 mg by mouth daily.     Yes [provider]  atorvastatin (LIPITOR) 10 MG tablet TAKE 1 TABLET BY MOUTH  DAILY AT 6 PM 11/30/19  Yes Burns, [name], MD  Blood Glucose Monitoring Suppl (ONE TOUCH ULTRA 2) w/Device KIT Use to check blood sugars twice a day Dx e11.9 03/19/19  Yes Burns, [name], MD  brimonidine (ALPHAGAN) 0.2 % ophthalmic solution Place 1 drop into the right eye 2 (two) times daily. 03/02/19  Yes [provider]  calcium carbonate (OS-CAL) 600 MG TABS Take 600 mg by mouth daily.    Yes [provider]  Cod Liver Oil 1000 MG CAPS Take 1,000 mg by mouth daily. During the winter   Yes [provider]  DORZOLAMIDE HCL-TIMOLOL MAL OP Place 1 drop into the right eye 2 (two) times daily.   Yes [provider]  gabapentin (NEURONTIN) 100 MG capsule TAKE 3 CAPSULES BY MOUTH AT BEDTIME Patient taking differently: Take 100 mg by mouth 2 (two) times daily. 1 capsule in the morning, and 2 capsules at bedtime 11/19/19  Yes Burns, [name], MD  hydrochlorothiazide (MICROZIDE) 12.5 MG capsule TAKE 1 CAPSULE BY MOUTH  DAILY AS NEEDED 05/25/19  Yes Burns, [name], MD  insulin NPH-regular Human (HUMULIN 70/30) (70-30) 100 UNIT/ML injection Inject 15 units in the morning and 7-10 units in the evening 04/10/19  Yes Burns, [name], MD  Insulin Syringe-Needle U-100 (BD VEO INSULIN SYRINGE U/F) 31G X 15/64" 0.3 ML MISC USE TWICE DAILY TO INJECT  INSULIN 11/30/19  Yes Burns, [name], MD  isosorbide mononitrate (IMDUR) 30 MG 24 hr tablet TAKE 1 TABLET BY MOUTH  DAILY 05/25/19  Yes Burns, [name], MD  latanoprost (XALATAN) 0.005 % ophthalmic solution 1 drop at bedtime.   Yes [provider]  Multiple Vitamin (MULTIVITAMIN) tablet Take 1 tablet by mouth daily. Prn   Yes [provider]  naproxen (NAPROSYN) 500 MG tablet Take 1 tablet (500 mg total) by mouth 2 (two) times daily as needed for moderate pain. 08/20/19  Yes Burns, [name], MD    nitroGLYCERIN (NITROSTAT) 0.4 MG SL tablet DISSOLVE ONE TABLET UNDER TONGUE AS DIRECTED FOR CHEST PAIN 08/05/13  Yes Larey Dresser, MD  omeprazole (PRILOSEC) 20 MG capsule TAKE 1 CAPSULE BY MOUTH IN  THE MORNING 11/13/19  Yes Burns, [name], MD  OneTouch Delica Lancets 39J MISC Use to check blood sugars 2 times daily. E11.9 03/22/19  Yes Burns, [name], MD  [facility] ULTRA test strip USE 1 STRIP TO CHECK GLUCOSE TWICE DAILY 06/01/19  Yes Binnie Rail, MD  quinapril (ACCUPRIL) 40 MG tablet TAKE 1 TABLET BY MOUTH  DAILY 05/25/19  Yes Burns, [name], MD  Trolamine Salicylate (ASPERCREME) 10 % LOTN Use twice daily as  needed for right knee pain.   Yes [provider]    Allergies    Codeine  Review of Systems   Review of Systems  Constitutional: Positive for fatigue. Negative for appetite change, diaphoresis, fever and unexpected weight change.  HENT: Negative for mouth sores.   Eyes: Negative for visual disturbance.  Respiratory: Negative for cough, chest tightness, shortness of breath and wheezing.   Cardiovascular: Negative for chest pain.  Gastrointestinal: Negative for abdominal pain, constipation, diarrhea, nausea and vomiting.  Endocrine: Negative for polydipsia, polyphagia and polyuria.       High glucose  Genitourinary: Negative for dysuria, frequency, hematuria and urgency.  Musculoskeletal: Negative for back pain and neck stiffness.  Skin: Negative for rash.  Allergic/Immunologic: Negative for immunocompromised state.  Neurological: Positive for weakness. Negative for syncope, light-headedness and headaches.  Hematological: Does not bruise/bleed easily.  Psychiatric/Behavioral: Negative for sleep disturbance. The patient is not nervous/anxious.     Physical Exam Updated Vital Signs BP (!) 143/60 (BP Location: Left Arm)   Pulse 91   Temp 99.2 F (37.3 C) (Oral)   Resp 16   SpO2 97%   Physical Exam Vitals and nursing note reviewed.  Constitutional:      General:  She is not in acute distress.    Appearance: She is not diaphoretic.  HENT:     Head: Normocephalic.     Mouth/Throat:     Mouth: Mucous membranes are dry.     Pharynx: No oropharyngeal exudate or posterior oropharyngeal erythema.  Eyes:     General: No scleral icterus.    Conjunctiva/sclera: Conjunctivae normal.  Cardiovascular:     Rate and Rhythm: Regular rhythm. Tachycardia present.     Pulses: Normal pulses.          Radial pulses are 2+ on the right side and 2+ on the left side.     Heart sounds: Murmur heard.      Comments: Initially with sinus tachycardia then changed to ventricular trigeminy with conducted PVCs. Pulmonary:     Effort: No tachypnea, accessory muscle usage, prolonged expiration, respiratory distress or retractions.     Breath sounds: No stridor.     Comments: Equal chest rise. No increased work of breathing. Abdominal:     General: There is no distension.     Palpations: Abdomen is soft.     Tenderness: There is no abdominal tenderness. There is no guarding or rebound.  Musculoskeletal:     Cervical back: Normal range of motion.     Right lower leg: Edema present.     Left lower leg: Edema present.     Comments: Moves all extremities equally and without difficulty. 2+ pitting edema from the distal forefoot to the proximal calf.  1+ pitting edema in the distal thigh.  Skin:    General: Skin is warm and dry.     Capillary Refill: Capillary refill takes less than 2 seconds.  Neurological:     General: No focal deficit present.     Mental Status: She is alert.     GCS: GCS eye subscore is 4. GCS verbal subscore is 5. GCS motor subscore is 6.     Comments: Speech is clear and goal oriented.  Psychiatric:        Mood and Affect: Mood normal.     ED Results / Procedures / Treatments   Labs (all labs ordered are listed, but only abnormal results are displayed) Labs Reviewed  CBC WITH DIFFERENTIAL/PLATELET - Abnormal;  Notable for the following  components:      Result Value   Hemoglobin 9.8 (*)    HCT 30.6 (*)    MCV 78.9 (*)    MCH 25.3 (*)    Monocytes Absolute 1.2 (*)    All other components within normal limits  COMPREHENSIVE METABOLIC PANEL - Abnormal; Notable for the following components:   Sodium 134 (*)    Glucose, Bld 308 (*)    BUN 37 (*)    Creatinine, Ser 1.13 (*)    Albumin 3.0 (*)    GFR, Estimated 48 (*)    All other components within normal limits  BRAIN NATRIURETIC PEPTIDE - Abnormal; Notable for the following components:   B Natriuretic Peptide 332.6 (*)    All other components within normal limits  CBG MONITORING, ED - Abnormal; Notable for the following components:   Glucose-Capillary 261 (*)    All other components within normal limits  TROPONIN I (HIGH SENSITIVITY) - Abnormal; Notable for the following components:   Troponin I (High Sensitivity) 47 (*)    All other components within normal limits  TROPONIN I (HIGH SENSITIVITY) - Abnormal; Notable for the following components:   Troponin I (High Sensitivity) 47 (*)    All other components within normal limits  RESPIRATORY PANEL BY RT PCR (FLU A&B, COVID)  URINALYSIS, ROUTINE W REFLEX MICROSCOPIC    EKG EKG Interpretation  Date/Time:  Saturday January 02 2020 23:06:34 EDT Ventricular Rate:  88 PR Interval:    QRS Duration: 76 QT Interval:  315 QTC Calculation: 381 R Axis:   -31 Text Interpretation: Sinus rhythm Multiple premature complexes, vent & supraven Inferior infarct, old Anterior infarct, old Lateral leads are also involved Confirmed by Thayer Jew [phone]) on 01/02/2020 11:10:33 PM   Radiology DG Chest Port 1 View  Result Date: 01/02/2020 CLINICAL DATA:  Weakness EXAM: PORTABLE CHEST 1 VIEW COMPARISON:  None. FINDINGS: The heart size and mediastinal contours are mildly enlarged. Aortic knob calcifications are seen. There is prominence of the central pulmonary vasculature. Both lungs are clear. The visualized skeletal  structures are unremarkable. IMPRESSION: Mild cardiomegaly and pulmonary vascular congestion. Electronically Signed   By: Prudencio Pair M.D.   On: 01/02/2020 23:18    Procedures Procedures (including critical care time)  Medications Ordered in ED Medications  furosemide (LASIX) injection 20 mg (20 mg Intravenous Given 01/03/20 0249)    ED Course  I have reviewed the triage vital signs and the nursing notes.  Pertinent labs & imaging results that were available during my care of the patient were reviewed by me and considered in my medical decision making (see chart for details).    MDM Rules/Calculators/A&P                           Patient presents with neurolyse weakness and hyperglycemia.  Oral temp 99.2.  Rectal temp 99.5.  Generalized weakness without focal neurologic deficit.  No slurred speech or abnormal coordination.  Patient is a good historian.  Work-up initiated.  Concern for possible fluid overload given significant peripheral edema.  Patient has no history of congestive heart failure.  IV Lasix given.  3:04 AM Patient with elevated troponin, stable.  No chest pain.  Chest x-ray shows vascular congestion.  A personal evaluated these images.  Given elevated troponin, vascular congestion, significant peripheral edema, neurolysed weakness and no history of CHF, patient will need to be admitted for CHF work-up.  The  patient was discussed with and seen by Dr. Dina Rich who agrees with the treatment plan.  3:15 AM Discussed patient's case with hospitalist, Dr. Sabino Gasser.  I have recommended admission and patient (and family if present) agree with this plan. Admitting physician will place admission orders.     Final Clinical Impression(s) / ED Diagnoses Final diagnoses:  Weakness  Hypervolemia, unspecified hypervolemia type  Hyperglycemia  Elevated troponin    Rx / DC Orders ED Discharge Orders    None       Agapito Games 01/03/20 0315    Horton, Barbette Hair, MD 01/05/20 [phone]

## 2020-01-02 NOTE — ED Triage Notes (Addendum)
Pt BIB [facility] from home where she lives alone complaining of weakness and fluctuating blood sugars x 2 weeks. CBG 317 with EMS. States that she uses insulin twice daily. Chronic bilateral lower leg edema and L drop foot. Uses a walker at home. A&Ox4.

## 2020-01-03 ENCOUNTER — Other Ambulatory Visit: Payer: Self-pay

## 2020-01-03 ENCOUNTER — Inpatient Hospital Stay (HOSPITAL_COMMUNITY): Payer: Medicare Other

## 2020-01-03 ENCOUNTER — Encounter (HOSPITAL_COMMUNITY): Payer: Medicare Other

## 2020-01-03 DIAGNOSIS — M7989 Other specified soft tissue disorders: Secondary | ICD-10-CM | POA: Diagnosis not present

## 2020-01-03 DIAGNOSIS — R52 Pain, unspecified: Secondary | ICD-10-CM | POA: Diagnosis not present

## 2020-01-03 DIAGNOSIS — I2510 Atherosclerotic heart disease of native coronary artery without angina pectoris: Secondary | ICD-10-CM | POA: Diagnosis present

## 2020-01-03 DIAGNOSIS — I4891 Unspecified atrial fibrillation: Secondary | ICD-10-CM | POA: Diagnosis not present

## 2020-01-03 DIAGNOSIS — I6782 Cerebral ischemia: Secondary | ICD-10-CM | POA: Diagnosis not present

## 2020-01-03 DIAGNOSIS — Z794 Long term (current) use of insulin: Secondary | ICD-10-CM | POA: Diagnosis not present

## 2020-01-03 DIAGNOSIS — Z8249 Family history of ischemic heart disease and other diseases of the circulatory system: Secondary | ICD-10-CM | POA: Diagnosis not present

## 2020-01-03 DIAGNOSIS — R5381 Other malaise: Secondary | ICD-10-CM

## 2020-01-03 DIAGNOSIS — R0602 Shortness of breath: Secondary | ICD-10-CM | POA: Diagnosis not present

## 2020-01-03 DIAGNOSIS — I493 Ventricular premature depolarization: Secondary | ICD-10-CM | POA: Diagnosis present

## 2020-01-03 DIAGNOSIS — E7800 Pure hypercholesterolemia, unspecified: Secondary | ICD-10-CM | POA: Diagnosis present

## 2020-01-03 DIAGNOSIS — J189 Pneumonia, unspecified organism: Secondary | ICD-10-CM | POA: Diagnosis not present

## 2020-01-03 DIAGNOSIS — I10 Essential (primary) hypertension: Secondary | ICD-10-CM

## 2020-01-03 DIAGNOSIS — Z955 Presence of coronary angioplasty implant and graft: Secondary | ICD-10-CM | POA: Diagnosis not present

## 2020-01-03 DIAGNOSIS — Z23 Encounter for immunization: Secondary | ICD-10-CM | POA: Diagnosis not present

## 2020-01-03 DIAGNOSIS — K219 Gastro-esophageal reflux disease without esophagitis: Secondary | ICD-10-CM | POA: Diagnosis present

## 2020-01-03 DIAGNOSIS — J9811 Atelectasis: Secondary | ICD-10-CM | POA: Diagnosis not present

## 2020-01-03 DIAGNOSIS — Z79899 Other long term (current) drug therapy: Secondary | ICD-10-CM | POA: Diagnosis not present

## 2020-01-03 DIAGNOSIS — E0591 Thyrotoxicosis, unspecified with thyrotoxic crisis or storm: Secondary | ICD-10-CM | POA: Diagnosis not present

## 2020-01-03 DIAGNOSIS — N1831 Chronic kidney disease, stage 3a: Secondary | ICD-10-CM | POA: Diagnosis present

## 2020-01-03 DIAGNOSIS — D509 Iron deficiency anemia, unspecified: Secondary | ICD-10-CM | POA: Diagnosis present

## 2020-01-03 DIAGNOSIS — I5033 Acute on chronic diastolic (congestive) heart failure: Secondary | ICD-10-CM | POA: Diagnosis present

## 2020-01-03 DIAGNOSIS — I517 Cardiomegaly: Secondary | ICD-10-CM | POA: Diagnosis not present

## 2020-01-03 DIAGNOSIS — R262 Difficulty in walking, not elsewhere classified: Secondary | ICD-10-CM | POA: Diagnosis present

## 2020-01-03 DIAGNOSIS — M545 Low back pain, unspecified: Secondary | ICD-10-CM | POA: Diagnosis not present

## 2020-01-03 DIAGNOSIS — I509 Heart failure, unspecified: Secondary | ICD-10-CM | POA: Diagnosis not present

## 2020-01-03 DIAGNOSIS — I739 Peripheral vascular disease, unspecified: Secondary | ICD-10-CM | POA: Diagnosis not present

## 2020-01-03 DIAGNOSIS — I313 Pericardial effusion (noninflammatory): Secondary | ICD-10-CM | POA: Diagnosis present

## 2020-01-03 DIAGNOSIS — D573 Sickle-cell trait: Secondary | ICD-10-CM | POA: Diagnosis present

## 2020-01-03 DIAGNOSIS — E785 Hyperlipidemia, unspecified: Secondary | ICD-10-CM | POA: Diagnosis present

## 2020-01-03 DIAGNOSIS — G319 Degenerative disease of nervous system, unspecified: Secondary | ICD-10-CM | POA: Diagnosis not present

## 2020-01-03 DIAGNOSIS — I130 Hypertensive heart and chronic kidney disease with heart failure and stage 1 through stage 4 chronic kidney disease, or unspecified chronic kidney disease: Secondary | ICD-10-CM | POA: Diagnosis present

## 2020-01-03 DIAGNOSIS — R609 Edema, unspecified: Secondary | ICD-10-CM

## 2020-01-03 DIAGNOSIS — R29818 Other symptoms and signs involving the nervous system: Secondary | ICD-10-CM | POA: Diagnosis not present

## 2020-01-03 DIAGNOSIS — E1122 Type 2 diabetes mellitus with diabetic chronic kidney disease: Secondary | ICD-10-CM | POA: Diagnosis present

## 2020-01-03 DIAGNOSIS — I5030 Unspecified diastolic (congestive) heart failure: Secondary | ICD-10-CM

## 2020-01-03 DIAGNOSIS — E0590 Thyrotoxicosis, unspecified without thyrotoxic crisis or storm: Secondary | ICD-10-CM | POA: Diagnosis not present

## 2020-01-03 DIAGNOSIS — Z801 Family history of malignant neoplasm of trachea, bronchus and lung: Secondary | ICD-10-CM | POA: Diagnosis not present

## 2020-01-03 DIAGNOSIS — Z20822 Contact with and (suspected) exposure to covid-19: Secondary | ICD-10-CM | POA: Diagnosis present

## 2020-01-03 DIAGNOSIS — E1165 Type 2 diabetes mellitus with hyperglycemia: Secondary | ICD-10-CM | POA: Diagnosis present

## 2020-01-03 DIAGNOSIS — Z7982 Long term (current) use of aspirin: Secondary | ICD-10-CM | POA: Diagnosis not present

## 2020-01-03 DIAGNOSIS — R531 Weakness: Secondary | ICD-10-CM | POA: Diagnosis present

## 2020-01-03 LAB — LIPID PANEL
Cholesterol: 112 mg/dL (ref 0–200)
HDL: 45 mg/dL (ref >40)
LDL Cholesterol: 54 mg/dL (ref 0–99)
Total CHOL/HDL Ratio: 2.5 RATIO
Triglycerides: 63 mg/dL (ref <150)
VLDL: 13 mg/dL (ref 0–40)

## 2020-01-03 LAB — RESPIRATORY PANEL BY RT PCR (FLU A&B, COVID)
Influenza A by PCR: NEGATIVE
Influenza B by PCR: NEGATIVE
SARS Coronavirus 2 by RT PCR: NEGATIVE

## 2020-01-03 LAB — URINALYSIS, ROUTINE W REFLEX MICROSCOPIC
Bilirubin Urine: NEGATIVE
Glucose, UA: 50 mg/dL — AB
Ketones, ur: NEGATIVE mg/dL
Leukocytes,Ua: NEGATIVE
Nitrite: POSITIVE — AB
Protein, ur: NEGATIVE mg/dL
Specific Gravity, Urine: 1.011 (ref 1.005–1.030)
pH: 5 (ref 5.0–8.0)

## 2020-01-03 LAB — BASIC METABOLIC PANEL
Anion gap: 11 (ref 5–15)
BUN: 36 mg/dL — ABNORMAL HIGH (ref 8–23)
CO2: 22 mmol/L (ref 22–32)
Calcium: 9 mg/dL (ref 8.9–10.3)
Chloride: 102 mmol/L (ref 98–111)
Creatinine, Ser: 0.96 mg/dL (ref 0.44–1.00)
GFR, Estimated: 58 mL/min — ABNORMAL LOW (ref >60)
Glucose, Bld: 292 mg/dL — ABNORMAL HIGH (ref 70–99)
Potassium: 4.2 mmol/L (ref 3.5–5.1)
Sodium: 135 mmol/L (ref 135–145)

## 2020-01-03 LAB — ECHOCARDIOGRAM COMPLETE
Area-P 1/2: 4.96 cm2
Height: 63 in
S' Lateral: 3 cm
Weight: 3075.86 oz

## 2020-01-03 LAB — CBC WITH DIFFERENTIAL/PLATELET
Abs Immature Granulocytes: 0.02 10*3/uL (ref 0.00–0.07)
Basophils Absolute: 0.1 10*3/uL (ref 0.0–0.1)
Basophils Relative: 1 %
Eosinophils Absolute: 0 10*3/uL (ref 0.0–0.5)
Eosinophils Relative: 0 %
HCT: 30.3 % — ABNORMAL LOW (ref 36.0–46.0)
Hemoglobin: 9.9 g/dL — ABNORMAL LOW (ref 12.0–15.0)
Immature Granulocytes: 0 %
Lymphocytes Relative: 20 %
Lymphs Abs: 1.3 10*3/uL (ref 0.7–4.0)
MCH: 25.4 pg — ABNORMAL LOW (ref 26.0–34.0)
MCHC: 32.7 g/dL (ref 30.0–36.0)
MCV: 77.9 fL — ABNORMAL LOW (ref 80.0–100.0)
Monocytes Absolute: 1.1 10*3/uL — ABNORMAL HIGH (ref 0.1–1.0)
Monocytes Relative: 17 %
Neutro Abs: 4.1 10*3/uL (ref 1.7–7.7)
Neutrophils Relative %: 62 %
Platelets: 221 10*3/uL (ref 150–400)
RBC: 3.89 MIL/uL (ref 3.87–5.11)
RDW: 15.5 % (ref 11.5–15.5)
WBC: 6.6 10*3/uL (ref 4.0–10.5)
nRBC: 0 % (ref 0.0–0.2)

## 2020-01-03 LAB — COMPREHENSIVE METABOLIC PANEL WITH GFR
ALT: 19 U/L (ref 0–44)
AST: 20 U/L (ref 15–41)
Albumin: 3 g/dL — ABNORMAL LOW (ref 3.5–5.0)
Alkaline Phosphatase: 49 U/L (ref 38–126)
Anion gap: 10 (ref 5–15)
BUN: 37 mg/dL — ABNORMAL HIGH (ref 8–23)
CO2: 22 mmol/L (ref 22–32)
Calcium: 8.9 mg/dL (ref 8.9–10.3)
Chloride: 102 mmol/L (ref 98–111)
Creatinine, Ser: 1.13 mg/dL — ABNORMAL HIGH (ref 0.44–1.00)
GFR, Estimated: 48 mL/min — ABNORMAL LOW
Glucose, Bld: 308 mg/dL — ABNORMAL HIGH (ref 70–99)
Potassium: 4.5 mmol/L (ref 3.5–5.1)
Sodium: 134 mmol/L — ABNORMAL LOW (ref 135–145)
Total Bilirubin: 0.4 mg/dL (ref 0.3–1.2)
Total Protein: 6.7 g/dL (ref 6.5–8.1)

## 2020-01-03 LAB — IRON AND TIBC
Iron: 23 ug/dL — ABNORMAL LOW (ref 28–170)
Saturation Ratios: 12 % (ref 10.4–31.8)
TIBC: 193 ug/dL — ABNORMAL LOW (ref 250–450)
UIBC: 170 ug/dL

## 2020-01-03 LAB — GLUCOSE, CAPILLARY
Glucose-Capillary: 278 mg/dL — ABNORMAL HIGH (ref 70–99)
Glucose-Capillary: 245 mg/dL — ABNORMAL HIGH (ref 70–99)
Glucose-Capillary: 343 mg/dL — ABNORMAL HIGH (ref 70–99)
Glucose-Capillary: 221 mg/dL — ABNORMAL HIGH (ref 70–99)

## 2020-01-03 LAB — TROPONIN I (HIGH SENSITIVITY)
Troponin I (High Sensitivity): 36 ng/L — ABNORMAL HIGH (ref <18)
Troponin I (High Sensitivity): 45 ng/L — ABNORMAL HIGH (ref <18)
Troponin I (High Sensitivity): 47 ng/L — ABNORMAL HIGH (ref <18)
Troponin I (High Sensitivity): 47 ng/L — ABNORMAL HIGH (ref <18)
Troponin I (High Sensitivity): 47 ng/L — ABNORMAL HIGH (ref <18)

## 2020-01-03 LAB — T4, FREE: Free T4: 5.39 ng/dL — ABNORMAL HIGH (ref 0.61–1.12)

## 2020-01-03 LAB — FERRITIN: Ferritin: 490 ng/mL — ABNORMAL HIGH (ref 11–307)

## 2020-01-03 LAB — BRAIN NATRIURETIC PEPTIDE: B Natriuretic Peptide: 332.6 pg/mL — ABNORMAL HIGH (ref 0.0–100.0)

## 2020-01-03 LAB — TSH: TSH: <0.01 u[IU]/mL — ABNORMAL LOW (ref 0.350–4.500)

## 2020-01-03 LAB — VITAMIN B12: Vitamin B-12: 1913 pg/mL — ABNORMAL HIGH (ref 180–914)

## 2020-01-03 LAB — MAGNESIUM: Magnesium: 1.9 mg/dL (ref 1.7–2.4)

## 2020-01-03 MED ORDER — SODIUM CHLORIDE 0.9 % IV SOLN: 250.0000 mL | INTRAVENOUS | Status: DC | PRN

## 2020-01-03 MED ORDER — ACETAMINOPHEN 325 MG PO TABS
650.0000 mg | ORAL_TABLET | ORAL | Status: DC | PRN
  Administered 2020-01-03 – 2020-01-10 (×6): 650 mg via ORAL
  Filled 2020-01-03 (×6): qty 2

## 2020-01-03 MED ORDER — LISINOPRIL 20 MG PO TABS
40.0000 mg | ORAL_TABLET | Freq: Every day | ORAL | Status: DC
  Administered 2020-01-03 – 2020-01-08 (×6): 40 mg via ORAL
  Filled 2020-01-03 (×6): qty 2

## 2020-01-03 MED ORDER — PROPYLTHIOURACIL 50 MG PO TABS: 100.0000 mg | ORAL_TABLET | Freq: Three times a day (TID) | ORAL | Status: DC

## 2020-01-03 MED ORDER — INFLUENZA VAC A&B SA ADJ QUAD 0.5 ML IM PRSY
0.5000 mL | PREFILLED_SYRINGE | INTRAMUSCULAR | Status: AC
  Administered 2020-01-08: 0.5 mL via INTRAMUSCULAR
  Filled 2020-01-03 (×3): qty 0.5

## 2020-01-03 MED ORDER — DORZOLAMIDE HCL-TIMOLOL MAL 2-0.5 % OP SOLN
1.0000 [drp] | Freq: Two times a day (BID) | OPHTHALMIC | Status: DC
  Administered 2020-01-03 – 2020-01-11 (×17): 1 [drp] via OPHTHALMIC
  Filled 2020-01-03: qty 10

## 2020-01-03 MED ORDER — ASPIRIN EC 81 MG PO TBEC
81.0000 mg | DELAYED_RELEASE_TABLET | Freq: Every day | ORAL | Status: DC
  Administered 2020-01-03 – 2020-01-11 (×9): 81 mg via ORAL
  Filled 2020-01-03 (×9): qty 1

## 2020-01-03 MED ORDER — SODIUM CHLORIDE 0.9% FLUSH: 3.0000 mL | INTRAVENOUS | Status: DC | PRN

## 2020-01-03 MED ORDER — GABAPENTIN 100 MG PO CAPS
100.0000 mg | ORAL_CAPSULE | Freq: Every day | ORAL | Status: DC
  Administered 2020-01-03 – 2020-01-11 (×9): 100 mg via ORAL
  Filled 2020-01-03 (×9): qty 1

## 2020-01-03 MED ORDER — FUROSEMIDE 10 MG/ML IJ SOLN
40.0000 mg | Freq: Two times a day (BID) | INTRAMUSCULAR | Status: DC
  Administered 2020-01-03 – 2020-01-07 (×9): 40 mg via INTRAVENOUS
  Filled 2020-01-03 (×9): qty 4

## 2020-01-03 MED ORDER — GABAPENTIN 100 MG PO CAPS
200.0000 mg | ORAL_CAPSULE | Freq: Every day | ORAL | Status: DC
  Administered 2020-01-03 – 2020-01-11 (×9): 200 mg via ORAL
  Filled 2020-01-03 (×9): qty 2

## 2020-01-03 MED ORDER — ADULT MULTIVITAMIN W/MINERALS CH
1.0000 | ORAL_TABLET | Freq: Every day | ORAL | Status: DC
  Administered 2020-01-03 – 2020-01-11 (×9): 1 via ORAL
  Filled 2020-01-03 (×12): qty 1

## 2020-01-03 MED ORDER — ONDANSETRON HCL 4 MG/2ML IJ SOLN
4.0000 mg | Freq: Four times a day (QID) | INTRAMUSCULAR | Status: DC | PRN
  Administered 2020-01-03: 4 mg via INTRAVENOUS
  Filled 2020-01-03: qty 2

## 2020-01-03 MED ORDER — ENOXAPARIN SODIUM 40 MG/0.4ML ~~LOC~~ SOLN
40.0000 mg | SUBCUTANEOUS | Status: DC
  Administered 2020-01-03 – 2020-01-05 (×3): 40 mg via SUBCUTANEOUS
  Filled 2020-01-03 (×3): qty 0.4

## 2020-01-03 MED ORDER — FUROSEMIDE 10 MG/ML IJ SOLN
20.0000 mg | Freq: Once | INTRAMUSCULAR | Status: AC
  Administered 2020-01-03: 20 mg via INTRAVENOUS
  Filled 2020-01-03: qty 4

## 2020-01-03 MED ORDER — INSULIN ASPART 100 UNIT/ML ~~LOC~~ SOLN
0.0000 [IU] | Freq: Three times a day (TID) | SUBCUTANEOUS | Status: DC
  Administered 2020-01-03: 5 [IU] via SUBCUTANEOUS
  Administered 2020-01-03: 8 [IU] via SUBCUTANEOUS
  Administered 2020-01-03: 11 [IU] via SUBCUTANEOUS
  Administered 2020-01-03: 5 [IU] via SUBCUTANEOUS
  Administered 2020-01-04: 8 [IU] via SUBCUTANEOUS
  Administered 2020-01-04 (×2): 5 [IU] via SUBCUTANEOUS
  Filled 2020-01-03: qty 0.15

## 2020-01-03 MED ORDER — BRIMONIDINE TARTRATE 0.2 % OP SOLN
1.0000 [drp] | Freq: Two times a day (BID) | OPHTHALMIC | Status: DC
  Administered 2020-01-03 – 2020-01-11 (×17): 1 [drp] via OPHTHALMIC
  Filled 2020-01-03: qty 5

## 2020-01-03 MED ORDER — INSULIN DETEMIR 100 UNIT/ML ~~LOC~~ SOLN
7.0000 [IU] | Freq: Two times a day (BID) | SUBCUTANEOUS | Status: DC
  Administered 2020-01-03 – 2020-01-04 (×3): 7 [IU] via SUBCUTANEOUS
  Filled 2020-01-03 (×4): qty 0.07

## 2020-01-03 MED ORDER — SODIUM CHLORIDE 0.9% FLUSH
3.0000 mL | Freq: Two times a day (BID) | INTRAVENOUS | Status: DC
  Administered 2020-01-03 – 2020-01-11 (×16): 3 mL via INTRAVENOUS

## 2020-01-03 MED ORDER — LATANOPROST 0.005 % OP SOLN
1.0000 [drp] | Freq: Every day | OPHTHALMIC | Status: DC
  Administered 2020-01-03 – 2020-01-10 (×8): 1 [drp] via OPHTHALMIC
  Filled 2020-01-03: qty 2.5

## 2020-01-03 NOTE — Progress Notes (Signed)
Bladder scan performed per Dr. Lowell Guitar. Pt had 619cc on bladder scan. Dr. Lowell Guitar notified.

## 2020-01-03 NOTE — Progress Notes (Signed)
OT Cancellation Note  Patient Details Name: Rebecca [name] MRN: [id number] DOB: 07/16/1932   Cancelled Treatment:    Reason Eval/Treat Not Completed: Other (comment). Per Dr. Lowell Guitar via secure chat - hold therapy evaluation until tomorrow.   [name] [name] 01/03/2020, 2:27 PM

## 2020-01-03 NOTE — Hospital Course (Addendum)
Rebecca [name] is an 86 yo female with PMH IDDM, HTN, CKD3a, HLD, GERD, CAD, chronic LE edema (PRN hctz at home) who presented to the ER from home due to SOB and worsening swelling in her legs. She also has had some varying glucose levels it appears and glucose level in the ER noted to be (261, 308).  She underwent workup with CXR and was found to have signs of volume overload with some pulmonary edema noted.  Her trop was initially 47 and on repeat was the same, 47.  BNP elevated 332.  EKG shows sinus rhythm with PVCs, no obvious ischemic changes.   Typically the patient lives alone she says and uses a cane for assistance with ambulation but over the past several days PTA has not been able to ambulate out of bed and her son had to carry her to the bathroom she says. When asking her why she cannot ambulate she states that her swelling is preventing her from "feeling her feet" which is making her balance off.  She was treated with IV lasix 20 mg x 1 in the ER. An in and out cath was also performed. She was admitted for further diuresis and work-up.   Ultimately her thyroid studies returned grossly abnormal with suppressed TSH and elevated T4/T3. Along with her presenting symptoms she was considered to have thyroid storm and started on treatment.  She responded well to methimazole, iodine drops, and hydrocortisone.  She was also started on propranolol and found to be in A. fib with RVR shortly after admission.  She converted spontaneously back to normal sinus rhythm.  Her beta-blocker was tapered down as she no longer required elevated doses. Her case was also discussed with endocrinology on the phone.  Her iodine drops and steroids were discontinued prior to discharge.  Methimazole dosing was also decreased at time of discharge.  She has an upcoming appointment already scheduled with endocrinology for follow-up after hospitalization. Also, she had been on treatment dose Lovenox when developing A. fib  however given no prior history and likely provoked in setting of thyroid storm, she was not continued on any further anticoagulation at discharge.  This was also discussed at length with patient and her family member bedside.  We did plan to have a Zio patch placed on the patient for further monitoring in case of paroxysmal A. fib.  This was arranged with cardiology at discharge and will be mailed to the patient for outpatient placement.  She may need further de-escalation/decrease of propranolol if develops any bradycardia.  See below for further plans by problem.

## 2020-01-03 NOTE — Assessment & Plan Note (Addendum)
-   hold amlodipine due to LE edema (edema now resolved, likely was in setting of uncontrolled thyroid) - continue ACEi - will add further agents if needed - holding imdur as well for now

## 2020-01-03 NOTE — Assessment & Plan Note (Addendum)
-   B/L LE edema; seen recently by [facility] in June 2021 also - recommended for compression stockings as well; she has not purchased these yet but does want to wear them - edema seems to have worsened (on admission), see above workup  -She is also on amlodipine at home. Given chronic lower extremity edema this is on hold and would try not to resume at discharge and instead initiate different antihypertensive agent if still needed on top of ACEi - now with thyroid treatment, edema has resolved

## 2020-01-03 NOTE — H&P (Signed)
History and Physical    Rebecca [name]  VVZ:[id number]  DOB: 05-28-1932  DOA: 01/02/2020  PCP: Rebecca Rail, MD Patient coming from: home  Chief Complaint: SOB, increased swelling, difficulty ambulating   HPI:  Rebecca [name] is an 86 yo female with PMH IDDM, HTN, CKD3a, HLD, GERD, CAD, chronic LE edema (PRN hctz at home) who presented to the ER from home due to SOB and worsening swelling in her legs. She also has had some varying glucose levels it appears and glucose level in the ER noted to be (261, 308).  She underwent workup with CXR and was found to have signs of volume overload with some pulmonary edema noted.  Her trop was initially 47 and on repeat was the same, 47.  BNP elevated 332.  EKG shows sinus rhythm with PVCs, no obvious ischemic changes.  No prior echo in epic, but patient states she was followed historically by Rebecca [name] and then Rebecca [name] but she cannot remember her last echo.   She is a slightly poor historian. Her son accompanies her in the ER but cannot provide much collateral information. Typically the patient lives alone she says and uses a cane for assistance with ambulation but over the past several days has not been able to ambulate out of bed and her son had to carry her to the bathroom she says. When asking her why she cannot ambulate she states that her swelling is preventing her from "feeling her feet" which is making her balance off.  She was treated with IV lasix 20 mg x 1 in the ER. An in and out cath was also performed. She is admitted for further diuresis and work-up of CHF.   I have personally briefly reviewed patient's old medical records in [facility] and discussed patient with the ER provider when appropriate/indicated.  Assessment/Plan: * SOB (shortness of breath) - given worsening LE edema, elevated BNP, pulmonary edema, and indeterminate trop concern is for CHF (no prior diagnosis nor workup) - obtain echo for baseline and  eval; depending on findings may need further ischemic evaluation and/or cardiology eval if EF significantly low vs outpatient follow up - continue IV lasix - strict I&O - trend troponin  CHF (congestive heart failure) ([place]) - see SOB workup - follow up echo  CAD (coronary artery disease) - last seen by Rebecca [name] 11/01/14; she has history of PCI with 2 stents in the 1990s - calcifications noted on cxr; recent PAD workup with [facility] on 08/24/19 as well (heavily calcified LE but not felt to have PAD in LE), however still at risk for progressive CAD (see SOB workup as well) - follow up echo - check TSH, lipid, A1c - continue asa, statin, ACEi  Chronic edema - B/L LE edema; seen recently by [facility] in June 2021 also - recommended for compression stockings as well; she has not purchased these yet but does want to wear them - edema seems to have worsened, see above workup  -She is also on amlodipine at home. Given chronic lower extremity edema this is on hold and would try not to resume at discharge and instead initiate different antihypertensive agent if still needed on top of ACEi  Microcytic anemia - noted [place] trait on patient history review - baseline Hgb ~11 g/dL - check iron labs and B12  Physical deconditioning - patient grossly weak in bed, unable to sit up without much assistance; also not ambulating anymore recently at home (may improve some with  CHF workup/tx) - will have PT/OT eval; she may require rehab at discharge - patient states she lives alone and typically used to be able to ambulate with a cane but lately cannot  Chronic kidney disease, stage 3a ([place]) - baseline creat ~1.1 - 1.2 - currently at baseline - avoid nephrotoxic agents as able   Diabetes ([place]) - last A1c 7.4% on 06/01/19 - repeat A1c now - continue on SSI and CBGs for now - hold home NPH; start basal insulin if glucose still elevated  Essential hypertension - hold amlodipine due to LE edema (see chronic  edema) - continue ACEi - will add further agents if needed - holding imdur as well for now    Code Status: Full DVT Prophylaxis: Lovenox Anticipated disposition is to: pending PT/OT evals, possibly rehab tho  History: Past Medical History:  Diagnosis Date  . Anemia, unspecified   . Ankle pain, left   . CAD (coronary artery disease)   . Esophageal reflux   . Fibrocystic breast disease   . Hiatal hernia   . Hypercholesteremia   . Hypertension   . IDDM (insulin dependent diabetes mellitus)    Type II  . Multinodular goiter   . Sickle-cell trait [facility])     Past Surgical History:  Procedure Laterality Date  . "FEMALE"  1976  . BACK SURGERY  1990  . BIOPSY THYROID     Rebecca [name]   . CORONARY ANGIOPLASTY    . FOOT SURGERY  2004  . RETINAL LASER PROCEDURE       reports that she has never smoked. She has never used smokeless tobacco. She reports that she does not drink alcohol and does not use drugs.  Allergies  Allergen Reactions  . Codeine     "Makes me drunk"    Family History  Problem Relation Age of Onset  . Hypertension Other   . Lung cancer Other   . Heart disease Other   . Gout Sister   . Gout Brother   . Heart attack Father   . Heart attack Mother   . Gout Brother    Home Medications: Prior to Admission medications   Medication Sig Start Date End Date Taking? Authorizing Provider  Alcohol Swabs PADS Use as needed to clean puncture site. 09/08/18  Yes [name], Rebecca Lick, MD  amLODipine (NORVASC) 10 MG tablet Take 1 tablet (10 mg total) by mouth daily. 11/30/19  Yes [name], Rebecca Lick, MD  Ascorbic Acid (VITAMIN C) 500 MG tablet Take 500 mg by mouth 2 (two) times daily.     Yes [provider]  aspirin 81 MG tablet Take 81 mg by mouth daily.     Yes [provider]  atorvastatin (LIPITOR) 10 MG tablet TAKE 1 TABLET BY MOUTH  DAILY AT 6 PM 11/30/19  Yes [name], Rebecca Lick, MD  Blood Glucose Monitoring Suppl (ONE TOUCH ULTRA 2) w/Device KIT Use to  check blood sugars twice a day Dx e11.9 03/19/19  Yes [name], Rebecca Lick, MD  brimonidine (ALPHAGAN) 0.2 % ophthalmic solution Place 1 drop into the right eye 2 (two) times daily. 03/02/19  Yes [provider]  calcium carbonate (OS-CAL) 600 MG TABS Take 600 mg by mouth daily.    Yes [provider]  Cod Liver Oil 1000 MG CAPS Take 1,000 mg by mouth daily. During the winter   Yes [provider]  DORZOLAMIDE HCL-TIMOLOL MAL OP Place 1 drop into the right eye 2 (two) times daily.  Yes [provider]  gabapentin (NEURONTIN) 100 MG capsule TAKE 3 CAPSULES BY MOUTH AT BEDTIME Patient taking differently: Take 100 mg by mouth 2 (two) times daily. 1 capsule in the morning, and 2 capsules at bedtime 11/19/19  Yes [name], Rebecca Lick, MD  hydrochlorothiazide (MICROZIDE) 12.5 MG capsule TAKE 1 CAPSULE BY MOUTH  DAILY AS NEEDED 05/25/19  Yes [name], Rebecca Lick, MD  insulin NPH-regular Human (HUMULIN 70/30) (70-30) 100 UNIT/ML injection Inject 15 units in the morning and 7-10 units in the evening 04/10/19  Yes [name], Rebecca Lick, MD  Insulin Syringe-Needle U-100 (BD VEO INSULIN SYRINGE U/F) 31G X 15/64" 0.3 ML MISC USE TWICE DAILY TO INJECT  INSULIN 11/30/19  Yes [name], Rebecca Lick, MD  isosorbide mononitrate (IMDUR) 30 MG 24 hr tablet TAKE 1 TABLET BY MOUTH  DAILY 05/25/19  Yes [name], Rebecca Lick, MD  latanoprost (XALATAN) 0.005 % ophthalmic solution 1 drop at bedtime.   Yes [provider]  Multiple Vitamin (MULTIVITAMIN) tablet Take 1 tablet by mouth daily. Prn   Yes [provider]  naproxen (NAPROSYN) 500 MG tablet Take 1 tablet (500 mg total) by mouth 2 (two) times daily as needed for moderate pain. 08/20/19  Yes [name], Rebecca Lick, MD  nitroGLYCERIN (NITROSTAT) 0.4 MG SL tablet DISSOLVE ONE TABLET UNDER TONGUE AS DIRECTED FOR CHEST PAIN 08/05/13  Yes Larey Dresser, MD  omeprazole (PRILOSEC) 20 MG capsule TAKE 1 CAPSULE BY MOUTH IN  THE MORNING 11/13/19  Yes [name], Rebecca Lick, MD  OneTouch  Delica Lancets 93X MISC Use to check blood sugars 2 times daily. E11.9 03/22/19  Yes [name], Rebecca Lick, MD  [facility] ULTRA test strip USE 1 STRIP TO CHECK GLUCOSE TWICE DAILY 06/01/19  Yes Rebecca Rail, MD  quinapril (ACCUPRIL) 40 MG tablet TAKE 1 TABLET BY MOUTH  DAILY 05/25/19  Yes [name], Rebecca Lick, MD  Trolamine Salicylate (ASPERCREME) 10 % LOTN Use twice daily as needed for right knee pain.   Yes [provider]    Review of Systems:  Pertinent items noted in HPI and remainder of comprehensive ROS otherwise negative.  Physical Exam: Vitals:   01/03/20 0215 01/03/20 0230 01/03/20 0245 01/03/20 0315  BP: 133/61 (!) 135/51 (!) 139/55 (!) 143/64  Pulse: 89 91 87 90  Resp: (!) 21 17 (!) 22 20  Temp:      TempSrc:      SpO2: 98% 98% 95% 96%   General appearance: Pleasant elderly woman resting in bed in no distress and appears comfortable Head: Normocephalic, without obvious abnormality, atraumatic Eyes: EOMI Lungs: Scattered bibasilar crackles, no wheezing Heart: regular rate and rhythm and S1, S2 normal Abdomen: normal findings: bowel sounds normal and soft, non-tender Extremities: 3+ bilateral lower extremity pitting edema up to lower thighs Skin: mobility and turgor normal Neurologic: Weakness throughout but no focal deficits  Labs on Admission:  I have personally reviewed following labs and imaging studies Results for orders placed or performed during the hospital encounter of 01/02/20 (from the past 24 hour(s))  CBG monitoring, ED     Status: Abnormal   Collection Time: 01/02/20 10:45 PM  Result Value Ref Range   Glucose-Capillary 261 (H) 70 - 99 mg/dL  CBC with Differential     Status: Abnormal   Collection Time: 01/02/20 10:50 PM  Result Value Ref Range   WBC 6.6 4.0 - 10.5 K/uL   RBC 3.88 3.87 - 5.11 MIL/uL   Hemoglobin 9.8 (L) 12.0 - 15.0 g/dL  HCT 30.6 (L) 36 - 46 %   MCV 78.9 (L) 80.0 - 100.0 fL   MCH 25.3 (L) 26.0 - 34.0 pg   MCHC 32.0 30.0 - 36.0 g/dL    RDW 15.5 11.5 - 15.5 %   Platelets 202 150 - 400 K/uL   nRBC 0.0 0.0 - 0.2 %   Neutrophils Relative % 57 %   Neutro Abs 3.8 1.7 - 7.7 K/uL   Lymphocytes Relative 22 %   Lymphs Abs 1.4 0.7 - 4.0 K/uL   Monocytes Relative 19 %   Monocytes Absolute 1.2 (H) 0.1 - 1.0 K/uL   Eosinophils Relative 0 %   Eosinophils Absolute 0.0 0.0 - 0.5 K/uL   Basophils Relative 1 %   Basophils Absolute 0.0 0.0 - 0.1 K/uL   Immature Granulocytes 1 %   Abs Immature Granulocytes 0.03 0.00 - 0.07 K/uL  Comprehensive metabolic panel     Status: Abnormal   Collection Time: 01/02/20 10:50 PM  Result Value Ref Range   Sodium 134 (L) 135 - 145 mmol/L   Potassium 4.5 3.5 - 5.1 mmol/L   Chloride 102 98 - 111 mmol/L   CO2 22 22 - 32 mmol/L   Glucose, Bld 308 (H) 70 - 99 mg/dL   BUN 37 (H) 8 - 23 mg/dL   Creatinine, Ser 1.13 (H) 0.44 - 1.00 mg/dL   Calcium 8.9 8.9 - 10.3 mg/dL   Total Protein 6.7 6.5 - 8.1 g/dL   Albumin 3.0 (L) 3.5 - 5.0 g/dL   AST 20 15 - 41 U/L   ALT 19 0 - 44 U/L   Alkaline Phosphatase 49 38 - 126 U/L   Total Bilirubin 0.4 0.3 - 1.2 mg/dL   GFR, Estimated 48 (L) >60 mL/min   Anion gap 10 5 - 15  Troponin I (High Sensitivity)     Status: Abnormal   Collection Time: 01/02/20 10:50 PM  Result Value Ref Range   Troponin I (High Sensitivity) 47 (H) <18 ng/L  Brain natriuretic peptide     Status: Abnormal   Collection Time: 01/02/20 10:51 PM  Result Value Ref Range   B Natriuretic Peptide 332.6 (H) 0.0 - 100.0 pg/mL  Urinalysis, Routine w reflex microscopic Urine, Catheterized     Status: Abnormal   Collection Time: 01/02/20 10:51 PM  Result Value Ref Range   Color, Urine YELLOW YELLOW   APPearance CLEAR CLEAR   Specific Gravity, Urine 1.011 1.005 - 1.030   pH 5.0 5.0 - 8.0   Glucose, UA 50 (A) NEGATIVE mg/dL   Hgb urine dipstick SMALL (A) NEGATIVE   Bilirubin Urine NEGATIVE NEGATIVE   Ketones, ur NEGATIVE NEGATIVE mg/dL   Protein, ur NEGATIVE NEGATIVE mg/dL   Nitrite POSITIVE (A)  NEGATIVE   Leukocytes,Ua NEGATIVE NEGATIVE   RBC / HPF 0-5 0 - 5 RBC/hpf   WBC, UA 0-5 0 - 5 WBC/hpf   Bacteria, UA MANY (A) NONE SEEN  Respiratory Panel by RT PCR (Flu A&B, Covid) - Nasopharyngeal Swab     Status: None   Collection Time: 01/02/20 10:52 PM   Specimen: Nasopharyngeal Swab  Result Value Ref Range   SARS Coronavirus 2 by RT PCR NEGATIVE NEGATIVE   Influenza A by PCR NEGATIVE NEGATIVE   Influenza B by PCR NEGATIVE NEGATIVE  Troponin I (High Sensitivity)     Status: Abnormal   Collection Time: 01/03/20 12:45 AM  Result Value Ref Range   Troponin I (High Sensitivity) 47 (H) <18 ng/L  Radiological Exams on Admission: DG Chest Port 1 View  Result Date: 01/02/2020 CLINICAL DATA:  Weakness EXAM: PORTABLE CHEST 1 VIEW COMPARISON:  None. FINDINGS: The heart size and mediastinal contours are mildly enlarged. Aortic knob calcifications are seen. There is prominence of the central pulmonary vasculature. Both lungs are clear. The visualized skeletal structures are unremarkable. IMPRESSION: Mild cardiomegaly and pulmonary vascular congestion. Electronically Signed   By: Prudencio Pair M.D.   On: 01/02/2020 23:18   DG Chest [name]  Final Result      Consults called:  None  EKG: Independently reviewed. Sinus rhythm with PVC noted. No ST abnormalities   Dwyane Dee, MD [facility] 01/03/2020, 4:28 AM

## 2020-01-03 NOTE — Assessment & Plan Note (Addendum)
-   given worsening LE edema, elevated BNP, pulmonary edema, and indeterminate trop concern is for CHF (no prior diagnosis nor workup)  - EF 60-65%, Grade II DD - strict I&O - trended troponin; lingered in mid 40s

## 2020-01-03 NOTE — Assessment & Plan Note (Addendum)
-   see SOB workup - follow up echo: EF 60-65%, GrII DD - hold lasix for now; repeat BMP in am; LE edema has essentially resolved - BMP suggested overdiuresis; give NS@75  x 12 hours.  Responded well on 01/08/2020 -Monitor off fluids and Lasix for now

## 2020-01-03 NOTE — Consult Note (Signed)
Reason for Consult: lumbar stenosis Referring Physician: caldwell  ANTONYA LEEDER is an 86 y.o. female.   HPI:  86 year old female presented to the hospital today with 2 weeks of progressive weakness in her lower extremities.  She called her son and granddaughter yesterday who called EMS when they found her unable to move her legs.  The patient endorses lower back pain with bilateral lower extremity pain as well.  She is very tender to touch anywhere on her legs.  Rates her pain a 10 and 10, constant, and achy.  She does have a history of lumbar surgery in 1990.  She denies any change in her bowel bladder habits.  She states that they were a couple nights where she could not walk to the bathroom so she urinated on herself.  Denies any numbness or tingling down her legs.  Has an extensive medical history.  Past Medical History:  Diagnosis Date  . Anemia, unspecified   . Ankle pain, left   . CAD (coronary artery disease)   . Esophageal reflux   . Fibrocystic breast disease   . Hiatal hernia   . Hypercholesteremia   . Hypertension   . IDDM (insulin dependent diabetes mellitus)    Type II  . Multinodular goiter   . Sickle-cell trait [facility])     Past Surgical History:  Procedure Laterality Date  . "FEMALE"  1976  . BACK SURGERY  1990  . BIOPSY THYROID     Dr.Ellison-Neg   . CORONARY ANGIOPLASTY    . FOOT SURGERY  2004  . RETINAL LASER PROCEDURE      Allergies  Allergen Reactions  . Codeine     "Makes me drunk"    Social History   Tobacco Use  . Smoking status: Never Smoker  . Smokeless tobacco: Never Used  Substance Use Topics  . Alcohol use: No    Family History  Problem Relation Age of Onset  . Hypertension Other   . Lung cancer Other   . Heart disease Other   . Gout Sister   . Gout Brother   . Heart attack Father   . Heart attack Mother   . Gout Brother      Review of Systems  Positive ROS: As above All other systems have been reviewed and were otherwise  negative with the exception of those mentioned in the HPI and as above.  Objective: Vital signs in last 24 hours: Temp:  [98.7 F (37.1 C)-100.3 F (37.9 C)] 100.3 F (37.9 C) (10/24 1348) Pulse Rate:  [85-102] 102 (10/24 1348) Resp:  [14-24] 20 (10/24 1348) BP: (115-158)/(45-67) 125/46 (10/24 1348) SpO2:  [94 %-100 %] 98 % (10/24 1348) Weight:  [87.2 kg] 87.2 kg (10/24 0552)  General Appearance: Alert, cooperative, no distress, appears stated age Head: Normocephalic, without obvious abnormality, atraumatic Eyes: PERRL, conjunctiva/corneas clear, EOM's intact, fundi benign, both eyes      Lungs:  respirations unlabored Heart: Regular rate and rhythm, Extremities: Extremities normal, atraumatic, no cyanosis or edema Pulses: 2+ and symmetric all extremities Skin: Skin color, texture, turgor normal, no rashes or lesions  NEUROLOGIC:   Mental status: A&O x4, no aphasia, good attention span, Memory and fund of knowledge Motor Exam -upper extremities grossly normal, 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 Sensory Exam - grossly normal Reflexes: symmetric, no pathologic reflexes, No Hoffman's, No clonus, difficult to examine as she is very tender to touch anywhere on her legs. Coordination -unable to test because of paraparesis Gait -unable to  test Balance -unable to test Cranial Nerves: I: smell Not tested  II: visual acuity  OS: na   OD: na  II: visual fields Full to confrontation  II: pupils Equal, round, reactive to light  III,VII: ptosis None  III,IV,VI: extraocular muscles  Full ROM  V: mastication   V: facial light touch sensation    V,VII: corneal reflex    VII: facial muscle function - upper    VII: facial muscle function - lower   VIII: hearing   IX: soft palate elevation    IX,X: gag reflex   XI: trapezius strength    XI: sternocleidomastoid strength   XI: neck flexion strength    XII: tongue strength      Data Review Lab Results  Component Value Date    WBC 6.6 01/03/2020   HGB 9.9 (L) 01/03/2020   HCT 30.3 (L) 01/03/2020   MCV 77.9 (L) 01/03/2020   PLT 221 01/03/2020   Lab Results  Component Value Date   NA 135 01/03/2020   K 4.2 01/03/2020   CL 102 01/03/2020   CO2 22 01/03/2020   BUN 36 (H) 01/03/2020   CREATININE 0.96 01/03/2020   GLUCOSE 292 (H) 01/03/2020   Lab Results  Component Value Date   INR 0.96 04/20/2009    Radiology: MR BRAIN WO CONTRAST  Result Date: 01/03/2020 CLINICAL DATA:  Acute stroke suspected.  Neuro deficit. EXAM: MRI HEAD WITHOUT CONTRAST TECHNIQUE: Multiplanar, multiecho pulse sequences of the brain and surrounding structures were obtained without intravenous contrast. COMPARISON:  None. FINDINGS: Brain: No acute infarction, hemorrhage, hydrocephalus, extra-axial collection or mass lesion. Generalized cortical atrophy. Mild chronic small vessel ischemia in the periventricular white matter for age. Remote lacunar infarct at the right thalamus. Small remote right cerebellar infarct. Vascular: Preserved flow voids Skull and upper cervical spine: Normal marrow signal Sinuses/Orbits: Bilateral cataract resection IMPRESSION: 1. No acute finding, including infarct. 2. Cortical atrophy and chronic small vessel disease. Electronically Signed   By: Marnee SpringJonathon  Watts M.D.   On: 01/03/2020 12:02   MR LUMBAR SPINE WO CONTRAST  Result Date: 01/03/2020 CLINICAL DATA:  Low back pain with bilateral leg weakness EXAM: MRI LUMBAR SPINE WITHOUT CONTRAST TECHNIQUE: Multiplanar, multisequence MR imaging of the lumbar spine was performed. No intravenous contrast was administered. COMPARISON:  X-ray 11/06/2018 FINDINGS: Segmentation: Suspect transitional lumbosacral anatomy with partial sacralization of the L5 segment. Grade 1 anterolisthesis is present at the L4-5 level, in keeping with numbering convention established on previous radiographs. Alignment:  5 mm grade 1 anterolisthesis L4 on L5. Vertebrae: No fracture, evidence of  discitis, or bone lesion. Mild diffuse intrinsic canal narrowing on the basis of congenitally short pedicles. Conus medullaris and cauda equina: Conus extends to the T12 level. Conus and cauda equina appear within normal limits. Paraspinal and other soft tissues: Cortically based T2 hyperintense lesionswithin the left kidney, incompletely characterized, but most likely represent cysts. Moderate distention of the urinary bladder is partially visualized. Disc levels: T11-T12: Sagittal sequences only. Moderate diffuse disc bulge with mild canal stenosis, moderate left and mild-to-moderate right foraminal stenosis. T12-L1: Sagittal sequences only. Mild circumferential disc bulge without evidence of foraminal or canal stenosis. L1-L2: Mild circumferential disc bulge with bilateral facet arthropathy and ligamentum flavum buckling resulting in mild-to-moderate canal stenosis with mild bilateral foraminal stenosis. L2-L3: Diffuse disc bulge with advanced bilateral facet arthropathy and ligamentum flavum buckling resulting in severe canal stenosis with moderate to severe right foraminal stenosis. L3-L4: Postsurgical changes to the left lamina. Mild  diffuse disc bulge with bilateral facet arthropathy and ligamentum flavum buckling resulting in mild canal stenosis with moderate to severe right and mild-to-moderate left foraminal stenosis. L4-L5: Postsurgical changes to the left lamina. Disc uncovering with mild diffuse disc bulge. Bilateral facet arthropathy. No canal stenosis. Moderate bilateral foraminal stenosis, left worse than right. L5-S1: Mild posterior osteophytic spurring without foraminal or canal stenosis. IMPRESSION: 1. Multilevel degenerative changes of the lumbar spine superimposed on a congenitally narrow canal. Findings are most pronounced at the L2-3 level where there is severe canal stenosis and moderate-to-severe right foraminal stenosis. 2. Additional levels of mild-to-moderate canal stenosis, as above. 3.  Moderate-to-severe right foraminal stenosis at L3-4. 4. Moderate bilateral foraminal stenosis at L4-5. 5. Moderate distention of the urinary bladder. Correlate for urinary retention. Electronically Signed   By: Duanne Guess D.O.   On: 01/03/2020 11:49   DG Chest Port 1 View  Result Date: 01/02/2020 CLINICAL DATA:  Weakness EXAM: PORTABLE CHEST 1 VIEW COMPARISON:  None. FINDINGS: The heart size and mediastinal contours are mildly enlarged. Aortic knob calcifications are seen. There is prominence of the central pulmonary vasculature. Both lungs are clear. The visualized skeletal structures are unremarkable. IMPRESSION: Mild cardiomegaly and pulmonary vascular congestion. Electronically Signed   By: Jonna Clark M.D.   On: 01/02/2020 23:18   ECHOCARDIOGRAM COMPLETE  Result Date: 01/03/2020    ECHOCARDIOGRAM REPORT   Patient Name:   JANEEN WATSON [facility] Date of Exam: 01/03/2020 Medical Rec #:  [id number]            Height:       63.0 in Accession #:    [id number]           Weight:       192.2 lb Date of Birth:  08-09-1932            BSA:          1.901 m Patient Age:    87 years             BP:           140/60 mmHg Patient Gender: F                    HR:           105 bpm. Exam Location:  Inpatient Procedure: 2D Echo, Color Doppler and Cardiac Doppler Indications:    I50.9* Heart failure (unspecified)  History:        Patient has no prior history of Echocardiogram examinations.                 CHF, CAD; Risk Factors:Hypertension, Diabetes, Dyslipidemia and                 Sickle Cell.  Sonographer:    Irving Burton Senior RDCS Referring Phys: [phone] DAVID GIRGUIS  Sonographer Comments: Technically difficult due to poor echo windows; patient unable to tolerate any probe pressure. IMPRESSIONS  1. Left ventricular ejection fraction, by estimation, is 60 to 65%. The left ventricle has normal function. The left ventricle has no regional wall motion abnormalities. There is mild asymmetric left ventricular hypertrophy of  the basal-septal segment. Left ventricular diastolic parameters are consistent with Grade II diastolic dysfunction (pseudonormalization). Elevated left atrial pressure.  2. Right ventricular systolic function is normal. The right ventricular size is normal.  3. A small pericardial effusion is present. The pericardial effusion is circumferential. There is no evidence of cardiac tamponade.  4. The mitral valve is grossly  normal. Mild mitral valve regurgitation. No evidence of mitral stenosis.  5. The aortic valve is tricuspid. Aortic valve regurgitation is not visualized. No aortic stenosis is present. FINDINGS  Left Ventricle: Left ventricular ejection fraction, by estimation, is 60 to 65%. The left ventricle has normal function. The left ventricle has no regional wall motion abnormalities. The left ventricular internal cavity size was normal in size. There is  mild asymmetric left ventricular hypertrophy of the basal-septal segment. Left ventricular diastolic parameters are consistent with Grade II diastolic dysfunction (pseudonormalization). Elevated left atrial pressure. Right Ventricle: The right ventricular size is normal. No increase in right ventricular wall thickness. Right ventricular systolic function is normal. Left Atrium: Left atrial size was normal in size. Right Atrium: Right atrial size was normal in size. Pericardium: A small pericardial effusion is present. The pericardial effusion is circumferential. There is no evidence of cardiac tamponade. Mitral Valve: The mitral valve is grossly normal. Mild mitral valve regurgitation. No evidence of mitral valve stenosis. Tricuspid Valve: The tricuspid valve is grossly normal. Tricuspid valve regurgitation is mild . No evidence of tricuspid stenosis. Aortic Valve: The aortic valve is tricuspid. Aortic valve regurgitation is not visualized. No aortic stenosis is present. Pulmonic Valve: The pulmonic valve was grossly normal. Pulmonic valve regurgitation is  trivial. No evidence of pulmonic stenosis. Aorta: The aortic root and ascending aorta are structurally normal, with no evidence of dilitation. Venous: The inferior vena cava was not well visualized. IAS/Shunts: The atrial septum is grossly normal.  LEFT VENTRICLE PLAX 2D LVIDd:         4.00 cm  Diastology LVIDs:         3.00 cm  LV e' medial:    4.68 cm/s LV PW:         0.90 cm  LV E/e' medial:  15.9 LV IVS:        1.20 cm  LV e' lateral:   4.24 cm/s LVOT diam:     2.00 cm  LV E/e' lateral: 17.6 LV SV:         51 LV SV Index:   27 LVOT Area:     3.14 cm  RIGHT VENTRICLE RV S prime:     23.40 cm/s TAPSE (M-mode): 2.7 cm LEFT ATRIUM           Index       RIGHT ATRIUM           Index LA diam:      2.10 cm 1.10 cm/m  RA Area:     13.50 cm LA Vol (A2C): 23.8 ml 12.52 ml/m RA Volume:   29.90 ml  15.73 ml/m LA Vol (A4C): 48.8 ml 25.67 ml/m  AORTIC VALVE LVOT Vmax:   87.50 cm/s LVOT Vmean:  67.100 cm/s LVOT VTI:    0.162 m  AORTA Ao Root diam: 3.30 cm Ao Asc diam:  3.20 cm MITRAL VALVE                TRICUSPID VALVE MV Area (PHT): 4.96 cm     TR Peak grad:   42.0 mmHg MV Decel Time: 153 msec     TR Vmax:        324.00 cm/s MV E velocity: 74.60 cm/s MV A velocity: 105.00 cm/s  SHUNTS MV E/A ratio:  0.71         Systemic VTI:  0.16 m  Systemic Diam: 2.00 cm Lennie Odor MD Electronically signed by Lennie Odor MD Signature Date/Time: 01/03/2020/10:31:23 AM    Final     Assessment/Plan: 86 year old female presented to the ER today with progressive bilateral lower extremity weakness over the last 2 weeks.  It has gotten significantly worse in the last couple days to the point where she is unable to walk at all.  MRI lumbar spine shows severe spinal canal stenosis at L2-3 with multilevel degenerative changes throughout her lumbar spine.  We had a lengthy discussion about getting an MRI of her thoracic spine to make sure she has no pathology there before making a decision about surgical  intervention on her lumbar spine.  She was very adamant multiple times that she was not going through another MRI nor was she going through any type of back surgery.  We discussed what her future would look like if no intervention were performed such as a skilled nursing facility.  She understood.  She is going to discuss this with her family.  Her granddaughter was at the bedside when we had this long discussion.  We will hold off on surgical intervention and further imaging at this time unless the patient changes her mind.   Tiana Loft Gerrod Maule 01/03/2020 3:45 PM

## 2020-01-03 NOTE — Assessment & Plan Note (Addendum)
-   last A1c 7.4% on 06/01/19 - repeat A1c now = 6.5% on 01/03/20 - continue on SSI and CBGs for now - d/c prandial coverage for now and watch - hold home NPH; resumed at discharge (glucose levels became elevated after steroids washed out)

## 2020-01-03 NOTE — Assessment & Plan Note (Addendum)
-   noted  trait on patient history review - baseline Hgb ~11 g/dL - check iron labs (borderline low) and B12 (adequate)

## 2020-01-03 NOTE — Assessment & Plan Note (Addendum)
-   last seen by Dr. Shirlee Latch 11/01/14; she has history of PCI with 2 stents in the 1990s - calcifications noted on cxr; recent PAD workup with [facility] on 08/24/19 as well (heavily calcified LE but not felt to have PAD in LE), however still at risk for progressive CAD (see SOB workup as well) - continue asa, statin, ACEi

## 2020-01-03 NOTE — Progress Notes (Signed)
Echocardiogram 2D Echocardiogram has been performed.  Rebecca [name] 01/03/2020, 8:19 AM

## 2020-01-03 NOTE — Assessment & Plan Note (Addendum)
-   patient grossly weak in bed, unable to sit up without much assistance; also not ambulating anymore recently at home (may improve some with CHF workup/tx) - see spinal stenosis and thyroid storm

## 2020-01-03 NOTE — Progress Notes (Signed)
PROGRESS NOTE    Rebecca [name]  OMV:[id number] DOB: [birth year] DOA: 01/02/2020 PCP: Rebecca Sanes, MD   Chief Complaint  Patient presents with  . Hyperglycemia  . Weakness    Brief Narrative:  Rebecca [name] is an 87 yo female with PMH IDDM, HTN, CKD3a, HLD, GERD, CAD, chronic LE edema (PRN hctz at home) who presented to the ER from home due to SOB and worsening swelling in her legs. She also has had some varying glucose levels it appears and glucose level in the ER noted to be (261, 308).  She underwent workup with CXR and was found to have signs of volume overload with some pulmonary edema noted.  Her trop was initially 47 and on repeat was the same, 47.  BNP elevated 332.  EKG shows sinus rhythm with PVCs, no obvious ischemic changes.  No prior echo in epic, but patient states she was followed historically by Rebecca [name] and then Rebecca [name] but she cannot remember her last echo.   She is Rebecca [name] slightly poor historian. Her son accompanies her in the ER but cannot provide much collateral information. Typically the patient lives alone she says and uses Rebecca [name] cane for assistance with ambulation but over the past several days has not been able to ambulate out of bed and her son had to carry her to the bathroom she says. When asking her why she cannot ambulate she states that her swelling is preventing her from "feeling her feet" which is making her balance off.  She was treated with IV lasix 20 mg x 1 in the ER. An in and out cath was also performed. She is admitted for further diuresis and work-up of CHF.   I have personally briefly reviewed patient's old medical records in [facility] and discussed patient with the ER provider when appropriate/indicated.  Assessment & Plan:   Principal Problem:   SOB (shortness of breath) Active Problems:   Essential hypertension   CAD (coronary artery disease)   Diabetes (HCC)   Chronic kidney disease, stage 3a (HCC)   Microcytic anemia    Chronic edema   CHF (congestive heart failure) (HCC)   Physical deconditioning  Bilateral Lower Extremity Weakness Previously able to ambulate and get around with walker, but for about the past week, unable to ambulate or turn herself in bed She describes decreased sensation to bilateral inner thighs - no bowel/bladder incontinence - retaining ~600 cc on bladder scan  ? If weakness changes related to edema and HF and/or hyperthyroidism, but will also obtain MRI lumbar spine/brain given concerning symptoms MRI brain/lumbar spine PT/OT  Hyperthyroidism TSH low, follow free T3/4.  Follow TSI. No palpable goiter.  No lid lag or proptosis.  She notes heat intolerance, decreased appetite.  Bilateral LE edema.  No tachycardia.   Doesn't take biotin or hair or nail supplement.  She takes women's mvi, will ask her to bring this in, may need to repeat labs in Rebecca [name] few days.  Elevated Temperature:  No true fever of 100.4, though several over 100, follow blood cx and urine culture. CXR from 10/23 with mild cardiomegaly and pulm vascular congestion.    Shortness of Breath  Bilateral Lower Extremity Edema  Concern for HF  Elevated Troponin: CXR with mild cardiomegaly and pulm vascular congestion BNP 332 Currently on RA, she denies significant SOB to me this AM Edema is chronic and has seen [facility] 08/2019 and recommended elevation, compression stockings Strict I/O, daily weights Lasix 40 mg  IV BID Follow Echocardiogram Follow LE US Consider cardiology c/s Hyperthyroidism could explain LE edema and SOB and HF  Physical Deconditioning Likely 2/2 above  Coronary Artery Disease last seen by Rebecca [name] 11/01/14; she has history of PCI with 2 stents in the 1990s Echo, TSH, lipid, A1c Asa, statin, acei  Microcytic Anemia Follow iron, b12, folate, ferritin  CKD IIIa Baseline ~1.1-1.2 Continue to follow  T2DM  Follow A1c, continue SSI Add levemir Continue to monitor  Hypertension Holding  amlodipine with edema Follow on Acei Holding imdur   DVT prophylaxis: lovenox Code Status: fulll  Family Communication: none at bedside Disposition:   Status is: Inpatient  Remains inpatient appropriate because:Inpatient level of care appropriate due to severity of illness   Dispo: The patient is from: Home              Anticipated d/c is to: Home              Anticipated d/c date is: > 3 days              Patient currently is not medically stable to d/c.  Consultants:   none  Procedures:   None   Antimicrobials:  Anti-infectives (From admission, onward)   None     Subjective: Notes weakness for about the past week She was previously able to get around with walker, but since Rebecca [name] weak ago, notes trouble even maneuvering in bed   Objective: Vitals:   01/03/20 0430 01/03/20 0517 01/03/20 0524 01/03/20 0552  BP: (!) 149/63 (!) 138/57 140/60   Pulse: 92 97 97   Resp: (!) 24 20 18    Temp:  99.9 F (37.7 C) 98.7 F (37.1 C)   TempSrc:  Oral Oral   SpO2: 96% 97%    Weight:    87.2 kg  Height:    5\' 3"  (1.6 m)   No intake or output data in the 24 hours ending 01/03/20 0921 Filed Weights   01/03/20 0552  Weight: 87.2 kg    Examination:  General exam: Appears calm and comfortable  Respiratory system: Clear to auscultation. Respiratory effort normal. Cardiovascular system: S1 & S2 heard, RRR Gastrointestinal system: Abdomen is nondistended, soft and nontender. Central nervous system: Alert and oriented. Symmetric strength to upper extremities, seemed weaker to lower extremities, but unable to test fully as she c/o pain with my resistance (~4/5 bilaterally).  Subjective decreased sensation to inner thighs bilaterally.   Extremities: bilateral LE edema Skin: No rashes, lesions or ulcers Psychiatry: Judgement and insight appear normal. Mood & affect appropriate.     Data Reviewed: I have personally reviewed following labs and imaging studies  CBC: Recent Labs   Lab 01/02/20 2250 01/03/20 0541  WBC 6.6 6.6  NEUTROABS 3.8 4.1  HGB 9.8* 9.9*  HCT 30.6* 30.3*  MCV 78.9* 77.9*  PLT 202 221    Basic Metabolic Panel: Recent Labs  Lab 01/02/20 2250 01/03/20 0541  NA 134* 135  K 4.5 4.2  CL 102 102  CO2 22 22  GLUCOSE 308* 292*  BUN 37* 36*  CREATININE 1.13* 0.96  CALCIUM 8.9 9.0  MG  --  1.9    GFR: Estimated Creatinine Clearance: 44.8 mL/min (by C-G formula based on SCr of 0.96 mg/dL).  Liver Function Tests: Recent Labs  Lab 01/02/20 2250  AST 20  ALT 19  ALKPHOS 49  BILITOT 0.4  PROT 6.7  ALBUMIN 3.0*    CBG: Recent Labs  Lab 01/02/20 2245 01/03/20  0748  GLUCAP 261* 278*     Recent Results (from the past 240 hour(s))  Respiratory Panel by RT PCR (Flu Creed Kail&B, Covid) - Nasopharyngeal Swab     Status: None   Collection Time: 01/02/20 10:52 PM   Specimen: Nasopharyngeal Swab  Result Value Ref Range Status   SARS Coronavirus 2 by RT PCR NEGATIVE NEGATIVE Final    Comment: (NOTE) SARS-CoV-2 target nucleic acids are NOT DETECTED.  The SARS-CoV-2 RNA is generally detectable in upper respiratoy specimens during the acute phase of infection. The lowest concentration of SARS-CoV-2 viral copies this assay can detect is 131 copies/mL. Willam Munford negative result does not preclude SARS-Cov-2 infection and should not be used as the sole basis for treatment or other patient management decisions. Loni Abdon negative result may occur with  improper specimen collection/handling, submission of specimen other than nasopharyngeal swab, presence of viral mutation(s) within the areas targeted by this assay, and inadequate number of viral copies (<131 copies/mL). Lisabeth Mian negative result must be combined with clinical observations, patient history, and epidemiological information. The expected result is Negative.  Fact Sheet for Patients:  https://www.moore.com/  Fact Sheet for Healthcare Providers:   https://www.young.biz/  This test is no t yet approved or cleared by the [place] FDA and  has been authorized for detection and/or diagnosis of SARS-CoV-2 by FDA under an Emergency Use Authorization (EUA). This EUA will remain  in effect (meaning this test can be used) for the duration of the COVID-19 declaration under Section 564(b)(1) of the Act, 21 U.S.C. section 360bbb-3(b)(1), unless the authorization is terminated or revoked sooner.     Influenza Delaine Hernandez by PCR NEGATIVE NEGATIVE Final   Influenza B by PCR NEGATIVE NEGATIVE Final    Comment: (NOTE) The Xpert Xpress SARS-CoV-2/FLU/RSV assay is intended as an aid in  the diagnosis of influenza from Nasopharyngeal swab specimens and  should not be used as Kipper Buch sole basis for treatment. Nasal washings and  aspirates are unacceptable for Xpert Xpress SARS-CoV-2/FLU/RSV  testing.  Fact Sheet for Patients: https://www.moore.com/  Fact Sheet for Healthcare Providers: https://www.young.biz/  This test is not yet approved or cleared by the [place] FDA and  has been authorized for detection and/or diagnosis of SARS-CoV-2 by  FDA under an Emergency Use Authorization (EUA). This EUA will remain  in effect (meaning this test can be used) for the duration of the  Covid-19 declaration under Section 564(b)(1) of the Act, 21  U.S.C. section 360bbb-3(b)(1), unless the authorization is  terminated or revoked. Performed at [facility], [address] [place], [place] [address]          Radiology Studies: [facility] Chest Port 1 View  Result Date: 01/02/2020 CLINICAL DATA:  Weakness EXAM: PORTABLE CHEST 1 VIEW COMPARISON:  None. FINDINGS: The heart size and mediastinal contours are mildly enlarged. Aortic knob calcifications are seen. There is prominence of the central pulmonary vasculature. Both lungs are clear. The visualized skeletal structures are unremarkable.  IMPRESSION: Mild cardiomegaly and pulmonary vascular congestion. Electronically Signed   By: Jonna Clark M.D.   On: 01/02/2020 23:18        Scheduled Meds: . aspirin EC  81 mg Oral Daily  . brimonidine  1 drop Right Eye BID  . dorzolamide-timolol  1 drop Right Eye BID  . enoxaparin (LOVENOX) injection  40 mg Subcutaneous Q24H  . furosemide  40 mg Intravenous BID  . gabapentin  100 mg Oral Daily  . gabapentin  200 mg Oral QHS  . [  START ON 01/04/2020] influenza vaccine adjuvanted  0.5 mL Intramuscular Tomorrow-1000  . insulin aspart  0-15 Units Subcutaneous TID AC & HS  . latanoprost  1 drop Right Eye QHS  . lisinopril  40 mg Oral Daily  . multivitamin with minerals  1 tablet Oral Daily  . sodium chloride flush  3 mL Intravenous Q12H   Continuous Infusions: . sodium chloride       LOS: 0 days    Time spent: over 30 min    Lacretia Nicks, MD [facility]   To contact the attending provider between 7A-7P or the covering provider during after hours 7P-7A, please log into the web site [email or web address] and access using universal [place] password for that web site. If you do not have the password, please call the hospital operator.  01/03/2020, 9:21 AM

## 2020-01-03 NOTE — Progress Notes (Signed)
PT Cancellation Note  Patient Details Name: Rebecca [name] MRN: [id number] DOB: 01-25-1933   Cancelled Treatment:    Reason Eval/Treat Not Completed: Medical issues which prohibited therapy. MD requested PT eval be held until tomorrow. Will check back as schedule allows.   Faye Ramsay, PT Acute Rehabilitation  Office: [phone] Pager: [phone]

## 2020-01-03 NOTE — Assessment & Plan Note (Addendum)
-   baseline creat ~1.1 - 1.2 - currently at baseline on admission - avoid nephrotoxic agents as able

## 2020-01-04 ENCOUNTER — Inpatient Hospital Stay (HOSPITAL_COMMUNITY): Payer: Medicare Other

## 2020-01-04 DIAGNOSIS — R52 Pain, unspecified: Secondary | ICD-10-CM

## 2020-01-04 DIAGNOSIS — M7989 Other specified soft tissue disorders: Secondary | ICD-10-CM

## 2020-01-04 DIAGNOSIS — R0602 Shortness of breath: Secondary | ICD-10-CM | POA: Diagnosis not present

## 2020-01-04 LAB — CBC WITH DIFFERENTIAL/PLATELET
Abs Immature Granulocytes: 0.03 10*3/uL (ref 0.00–0.07)
Basophils Absolute: 0 10*3/uL (ref 0.0–0.1)
Basophils Relative: 0 %
Eosinophils Absolute: 0 10*3/uL (ref 0.0–0.5)
Eosinophils Relative: 0 %
HCT: 31.6 % — ABNORMAL LOW (ref 36.0–46.0)
Hemoglobin: 10.6 g/dL — ABNORMAL LOW (ref 12.0–15.0)
Immature Granulocytes: 0 %
Lymphocytes Relative: 10 %
Lymphs Abs: 0.9 10*3/uL (ref 0.7–4.0)
MCH: 25.9 pg — ABNORMAL LOW (ref 26.0–34.0)
MCHC: 33.5 g/dL (ref 30.0–36.0)
MCV: 77.3 fL — ABNORMAL LOW (ref 80.0–100.0)
Monocytes Absolute: 0.9 10*3/uL (ref 0.1–1.0)
Monocytes Relative: 10 %
Neutro Abs: 6.9 10*3/uL (ref 1.7–7.7)
Neutrophils Relative %: 80 %
Platelets: 215 10*3/uL (ref 150–400)
RBC: 4.09 MIL/uL (ref 3.87–5.11)
RDW: 15.2 % (ref 11.5–15.5)
WBC: 8.8 10*3/uL (ref 4.0–10.5)
nRBC: 0 % (ref 0.0–0.2)

## 2020-01-04 LAB — GLUCOSE, CAPILLARY
Glucose-Capillary: 216 mg/dL — ABNORMAL HIGH (ref 70–99)
Glucose-Capillary: 232 mg/dL — ABNORMAL HIGH (ref 70–99)
Glucose-Capillary: 293 mg/dL — ABNORMAL HIGH (ref 70–99)
Glucose-Capillary: 264 mg/dL — ABNORMAL HIGH (ref 70–99)

## 2020-01-04 LAB — T3, FREE: T3, Free: 13.4 pg/mL — ABNORMAL HIGH (ref 2.0–4.4)

## 2020-01-04 LAB — COMPREHENSIVE METABOLIC PANEL
ALT: 22 U/L (ref 0–44)
AST: 30 U/L (ref 15–41)
Albumin: 2.7 g/dL — ABNORMAL LOW (ref 3.5–5.0)
Alkaline Phosphatase: 66 U/L (ref 38–126)
Anion gap: 10 (ref 5–15)
BUN: 35 mg/dL — ABNORMAL HIGH (ref 8–23)
CO2: 25 mmol/L (ref 22–32)
Calcium: 9 mg/dL (ref 8.9–10.3)
Chloride: 100 mmol/L (ref 98–111)
Creatinine, Ser: 0.96 mg/dL (ref 0.44–1.00)
GFR, Estimated: 58 mL/min — ABNORMAL LOW (ref >60)
Glucose, Bld: 244 mg/dL — ABNORMAL HIGH (ref 70–99)
Potassium: 4.1 mmol/L (ref 3.5–5.1)
Sodium: 135 mmol/L (ref 135–145)
Total Bilirubin: 0.9 mg/dL (ref 0.3–1.2)
Total Protein: 6.6 g/dL (ref 6.5–8.1)

## 2020-01-04 LAB — MAGNESIUM: Magnesium: 1.7 mg/dL (ref 1.7–2.4)

## 2020-01-04 LAB — HEMOGLOBIN A1C
Hgb A1c MFr Bld: 6.5 % — ABNORMAL HIGH (ref 4.8–5.6)
Mean Plasma Glucose: 140 mg/dL

## 2020-01-04 LAB — PHOSPHORUS: Phosphorus: 3.5 mg/dL (ref 2.5–4.6)

## 2020-01-04 MED ORDER — INSULIN ASPART 100 UNIT/ML ~~LOC~~ SOLN
0.0000 [IU] | Freq: Three times a day (TID) | SUBCUTANEOUS | Status: DC
  Administered 2020-01-05 (×2): 15 [IU] via SUBCUTANEOUS
  Administered 2020-01-05 – 2020-01-06 (×2): 7 [IU] via SUBCUTANEOUS
  Administered 2020-01-06 (×2): 15 [IU] via SUBCUTANEOUS
  Administered 2020-01-07: 4 [IU] via SUBCUTANEOUS
  Administered 2020-01-07: 11 [IU] via SUBCUTANEOUS
  Administered 2020-01-07: 15 [IU] via SUBCUTANEOUS
  Administered 2020-01-08 – 2020-01-09 (×3): 7 [IU] via SUBCUTANEOUS
  Administered 2020-01-10: 11 [IU] via SUBCUTANEOUS
  Administered 2020-01-10 (×2): 7 [IU] via SUBCUTANEOUS
  Administered 2020-01-11: 11 [IU] via SUBCUTANEOUS
  Administered 2020-01-11: 7 [IU] via SUBCUTANEOUS
  Administered 2020-01-11: 11 [IU] via SUBCUTANEOUS

## 2020-01-04 MED ORDER — SODIUM CHLORIDE 0.9 % IV SOLN
2.0000 g | INTRAVENOUS | Status: AC
  Administered 2020-01-04 – 2020-01-08 (×5): 2 g via INTRAVENOUS
  Filled 2020-01-04 (×5): qty 2

## 2020-01-04 MED ORDER — AZITHROMYCIN 250 MG PO TABS
500.0000 mg | ORAL_TABLET | Freq: Every day | ORAL | Status: AC
  Administered 2020-01-04 – 2020-01-08 (×5): 500 mg via ORAL
  Filled 2020-01-04 (×5): qty 2

## 2020-01-04 MED ORDER — DEXAMETHASONE SODIUM PHOSPHATE 4 MG/ML IJ SOLN
4.0000 mg | Freq: Two times a day (BID) | INTRAMUSCULAR | Status: DC
  Administered 2020-01-04: 4 mg via INTRAVENOUS
  Filled 2020-01-04: qty 1

## 2020-01-04 MED ORDER — INSULIN ASPART 100 UNIT/ML ~~LOC~~ SOLN
0.0000 [IU] | Freq: Every day | SUBCUTANEOUS | Status: DC
  Administered 2020-01-04: 3 [IU] via SUBCUTANEOUS
  Administered 2020-01-05: 2 [IU] via SUBCUTANEOUS
  Administered 2020-01-11: 3 [IU] via SUBCUTANEOUS

## 2020-01-04 NOTE — NC FL2 (Signed)
[place] MEDICAID FL2 LEVEL OF CARE SCREENING TOOL     IDENTIFICATION  Patient Name: Rebecca [name] Birthdate: [birth year] Sex: female Admission Date (Current Location): 01/02/2020  [facility] and [place] Number:  Producer, television/film/video and Address:  [facility],  [address] [place], [place] [address]      Provider Number: [id number]  Attending Physician Name and Address:  Zigmund Daniel., *  Relative Name and Phone Number:  Leafy Kindle [facility] [phone]    Current Level of Care: Hospital Recommended Level of Care: Skilled Nursing Facility Prior Approval Number:    Date Approved/Denied:   PASRR Number: [id number]  Discharge Plan: SNF    Current Diagnoses: Patient Active Problem List   Diagnosis Date Noted  . SOB (shortness of breath) 01/03/2020  . Chronic kidney disease, stage 3a (HCC) 01/03/2020  . Microcytic anemia 01/03/2020  . Chronic edema 01/03/2020  . CHF (congestive heart failure) (HCC) 01/03/2020  . Physical deconditioning 01/03/2020  . Generalized osteoarthritis of hand 06/01/2019  . Pressure sore, right foot 06/01/2019  . Peripheral neuropathy 06/01/2019  . Degenerative arthritis of right knee 12/15/2018  . Knee osteoarthritis 09/08/2018  . Pes planus 12/01/2017  . Foot deformity, acquired, left 11/04/2017  . Degenerative disc disease, lumbar 08/01/2016  . Glaucoma 07/23/2016  . Numbness 04/23/2016  . Chronic pain of right knee 12/28/2015  . Lower back pain 10/24/2015  . Pain in both feet 10/24/2015  . Abnormality of gait 04/01/2013  . Posterior tibial tendon dysfunction 04/01/2013  . Diabetes (HCC) 04/01/2013  . Ankle pain, left 10/20/2010  . UNSPECIFIED VITAMIN D DEFICIENCY 11/08/2008  . EDEMA- LOCALIZED 11/08/2008  . Sickle-cell trait (HCC) 01/01/2008  . Esophageal reflux 09/02/2007  . GOITER, MULTINODULAR 11/26/2006  . CAD (coronary artery disease) 11/26/2006  . HIATAL HERNIA 11/26/2006  . FIBROCYSTIC BREAST  DISEASE 07/31/2006  . HYPERCHOLESTEROLEMIA 05/27/2006  . Essential hypertension 05/27/2006    Orientation RESPIRATION BLADDER Height & Weight     Self, Time, Situation, Place  Normal Continent Weight: 87.2 kg Height:  5\' 3"  (160 cm)  BEHAVIORAL SYMPTOMS/MOOD NEUROLOGICAL BOWEL NUTRITION STATUS      Continent Diet (Heart Healthy)  AMBULATORY STATUS COMMUNICATION OF NEEDS Skin   Limited Assist Verbally Normal                       Personal Care Assistance Level of Assistance  Bathing, Feeding Bathing Assistance: Limited assistance Feeding assistance: Limited assistance       Functional Limitations Info  Sight, Hearing, Speech Sight Info: Impaired (eyeglasses) Hearing Info: Adequate Speech Info: Impaired (Dentures-top)    SPECIAL CARE FACTORS FREQUENCY  PT (By licensed PT), OT (By licensed OT)     PT Frequency: 5x week OT Frequency: 5x week            Contractures Contractures Info: Not present    Additional Factors Info  Code Status, Allergies, Insulin Sliding Scale Code Status Info:  (Full code) Allergies Info:  (Codeine)   Insulin Sliding Scale Info:  (SSI see MAR)       Current Medications (01/04/2020):  This is the current hospital active medication list Current Facility-Administered Medications  Medication Dose Route Frequency Provider Last Rate Last Admin  . 0.9 %  sodium chloride infusion  250 mL Intravenous PRN 01/06/2020, MD      . acetaminophen (TYLENOL) tablet 650 mg  650 mg Oral Q4H PRN Lewie Chamber, MD   650 mg at  01/04/20 8916  . aspirin EC tablet 81 mg  81 mg Oral Daily Lewie Chamber, MD   81 mg at 01/04/20 0948  . brimonidine (ALPHAGAN) 0.2 % ophthalmic solution 1 drop  1 drop Right Eye BID Lewie Chamber, MD   1 drop at 01/04/20 0948  . dorzolamide-timolol (COSOPT) 22.3-6.8 MG/ML ophthalmic solution 1 drop  1 drop Right Eye BID Lewie Chamber, MD   1 drop at 01/04/20 0948  . enoxaparin (LOVENOX) injection 40 mg  40 mg Subcutaneous  Q24H Lewie Chamber, MD   40 mg at 01/04/20 0949  . furosemide (LASIX) injection 40 mg  40 mg Intravenous BID Lewie Chamber, MD   40 mg at 01/04/20 1717  . gabapentin (NEURONTIN) capsule 100 mg  100 mg Oral Daily Lewie Chamber, MD   100 mg at 01/04/20 0948  . gabapentin (NEURONTIN) capsule 200 mg  200 mg Oral Axel Filler, MD   200 mg at 01/03/20 2142  . influenza vaccine adjuvanted (FLUAD) injection 0.5 mL  0.5 mL Intramuscular Tomorrow-1000 Lewie Chamber, MD      . insulin aspart (novoLOG) injection 0-15 Units  0-15 Units Subcutaneous TID AC & HS Lewie Chamber, MD   8 Units at 01/04/20 1717  . insulin detemir (LEVEMIR) injection 7 Units  7 Units Subcutaneous BID Zigmund Daniel., MD   7 Units at 01/04/20 [phone]  . latanoprost (XALATAN) 0.005 % ophthalmic solution 1 drop  1 drop Right Eye Axel Filler, MD   1 drop at 01/03/20 2044  . lisinopril (ZESTRIL) tablet 40 mg  40 mg Oral Daily Lewie Chamber, MD   40 mg at 01/04/20 0951  . multivitamin with minerals tablet 1 tablet  1 tablet Oral Daily Lewie Chamber, MD   1 tablet at 01/04/20 [phone]  . ondansetron (ZOFRAN) injection 4 mg  4 mg Intravenous Q6H PRN Lewie Chamber, MD   4 mg at 01/03/20 0541  . sodium chloride flush (NS) 0.9 % injection 3 mL  3 mL Intravenous Q12H Lewie Chamber, MD   3 mL at 01/04/20 1036  . sodium chloride flush (NS) 0.9 % injection 3 mL  3 mL Intravenous PRN Lewie Chamber, MD         Discharge Medications: Please see discharge summary for a list of discharge medications.  Relevant Imaging Results:  Relevant Lab Results:   Additional Information SS#[phone], Olegario Messier, [place]

## 2020-01-04 NOTE — Plan of Care (Signed)

## 2020-01-04 NOTE — TOC Initial Note (Signed)
Transition of Care [facility]) - Initial/Assessment Note    Patient Details  Name: Rebecca [name] MRN: [id number] Date of Birth: 16-Nov-1932  [facility] CM/SW Contact:    Lanier Clam, RN Phone Number: 01/04/2020, 6:00 PM  Clinical Narrative: Spoke to dtr in [place] on phone about d/c plans-PT recc SNF-agree to SNF-faxed out await bed offers,then start auth within 48hrs of medical stability.                  Expected Discharge Plan: Skilled Nursing Facility Barriers to Discharge: Continued Medical Work up   Patient Goals and CMS Choice Patient states their goals for this hospitalization and ongoing recovery are:: go to rehab CMS Medicare.gov Compare Post Acute Care list provided to:: Patient Represenative (must comment) Choice offered to / list presented to : Adult Children  Expected Discharge Plan and Services Expected Discharge Plan: Skilled Nursing Facility   Discharge Planning Services: CM Consult   Living arrangements for the past 2 months: Single Family Home                                      Prior Living Arrangements/Services Living arrangements for the past 2 months: Single Family Home Lives with:: Adult Children Patient language and need for interpreter reviewed:: Yes Do you feel safe going back to the place where you live?: Yes      Need for Family Participation in Patient Care: No (Comment) Care giver support system in place?: Yes (comment) Current home services: DME (rw) Criminal Activity/Legal Involvement Pertinent to Current Situation/Hospitalization: No - Comment as needed  Activities of Daily Living Home Assistive Devices/Equipment: Cane (specify quad or straight), Eyeglasses, Walker (specify type) ADL Screening (condition at time of admission) Patient's cognitive ability adequate to safely complete daily activities?: Yes Is the patient deaf or have difficulty hearing?: No Does the patient have difficulty seeing, even when wearing  glasses/contacts?: No Does the patient have difficulty concentrating, remembering, or making decisions?: No Patient able to express need for assistance with ADLs?: Yes Does the patient have difficulty dressing or bathing?: Yes Independently performs ADLs?: No Does the patient have difficulty walking or climbing stairs?: Yes Weakness of Legs: Both Weakness of Arms/Hands: Both  Permission Sought/Granted Permission sought to share information with : Case Manager Permission granted to share information with : Yes, Verbal Permission Granted  Share Information with NAME: Case Manager     Permission granted to share info w Relationship: Leafy Kindle [place] [phone]     Emotional Assessment Appearance:: Appears stated age Attitude/Demeanor/Rapport: Gracious Affect (typically observed): Accepting Orientation: : Oriented to Self, Oriented to Place, Oriented to  Time, Oriented to Situation Alcohol / Substance Use: Not Applicable Psych Involvement: No (comment)  Admission diagnosis:  CHF (congestive heart failure) (HCC) [I50.9] Weakness [R53.1] Hyperglycemia [R73.9] Elevated troponin [R77.8] Hypervolemia, unspecified hypervolemia type [E87.70] Patient Active Problem List   Diagnosis Date Noted  . SOB (shortness of breath) 01/03/2020  . Chronic kidney disease, stage 3a (HCC) 01/03/2020  . Microcytic anemia 01/03/2020  . Chronic edema 01/03/2020  . CHF (congestive heart failure) (HCC) 01/03/2020  . Physical deconditioning 01/03/2020  . Generalized osteoarthritis of hand 06/01/2019  . Pressure sore, right foot 06/01/2019  . Peripheral neuropathy 06/01/2019  . Degenerative arthritis of right knee 12/15/2018  . Knee osteoarthritis 09/08/2018  . Pes planus 12/01/2017  . Foot deformity, acquired, left 11/04/2017  . Degenerative  disc disease, lumbar 08/01/2016  . Glaucoma 07/23/2016  . Numbness 04/23/2016  . Chronic pain of right knee 12/28/2015  . Lower back pain 10/24/2015  .  Pain in both feet 10/24/2015  . Abnormality of gait 04/01/2013  . Posterior tibial tendon dysfunction 04/01/2013  . Diabetes (HCC) 04/01/2013  . Ankle pain, left 10/20/2010  . UNSPECIFIED VITAMIN D DEFICIENCY 11/08/2008  . EDEMA- LOCALIZED 11/08/2008  . Sickle-cell trait (HCC) 01/01/2008  . Esophageal reflux 09/02/2007  . GOITER, MULTINODULAR 11/26/2006  . CAD (coronary artery disease) 11/26/2006  . HIATAL HERNIA 11/26/2006  . FIBROCYSTIC BREAST DISEASE 07/31/2006  . HYPERCHOLESTEROLEMIA 05/27/2006  . Essential hypertension 05/27/2006   PCP:  Pincus Sanes, MD Pharmacy:   [facility] [address] ([address] [place] - [address] [place]) [place] [address] Phone: [phone] Fax: [phone]  [facility] [place], [place] - [address] [place] [address] Phone: [phone] Fax: [phone]  [facility] - [place] - [address] [place], [address] [place], [address] [place] [address] Phone: [phone] Fax: [phone]     Social Determinants of Health (SDOH) Interventions    Readmission Risk Interventions No flowsheet data found.

## 2020-01-04 NOTE — Evaluation (Signed)
Physical Therapy Evaluation Patient Details Name: Rebecca [name] MRN: [id number] DOB: October 25, 1932 Today's Date: 01/04/2020   History of Present Illness  Ms. Rebecca [name] is an 86 yo female with PMH IDDM, HTN, CKD3a, HLD, GERD, CAD, chronic LE edema (PRN hctz at home) who presented to the ER from home due to SOB and worsening swelling in her legs and difficulty ambulating.MRI lumbar spine notable for multilevel degenerative changes of lumbar spine superimposed on a congenitally narrow canal ( Neurosurgery c/s - pt not interested in additional imaging or surgery at this time) and MRI of brain: no acute findings.    Clinical Impression  Rebecca [name] is 86 y.o. female admitted with above HPI and diagnosis. Patient is currently limited by functional impairments below (see PT problem list). Patient lives with her son but is typically alone during the day and is independent for mobility with RW at baseline. She currently requires Mod-Max assist +2 for mobility. Pt was able to sit EOB and hold her balance while completing self care tasks with OT. Mod-Max assist required +2 for power up to stand with RW this session, pt performed 3x with RW and 2x with Stedy to transfer to recliner as pt was unable to take steps with RW. Patient will benefit from continued skilled PT interventions to address impairments and progress independence with mobility, recommending SNF level follow up for therapy. Acute PT will follow and progress as able.     Follow Up Recommendations SNF;Supervision/Assistance - 24 hour    Equipment Recommendations  None recommended by PT    Recommendations for Other Services       Precautions / Restrictions Precautions Precautions: Fall Restrictions Weight Bearing Restrictions: No      Mobility  Bed Mobility Overal bed mobility: Needs Assistance Bed Mobility: Supine to Sit     Supine to sit: HOB elevated;+2 for safety/equipment;Max assist;+2 for physical assistance      General bed mobility comments: pt taking increased time to complete, cues to walk bil LE's towards EOB and using bed rail to rasie turnk. Max Assist +2 with use of bed pad to pivot and raise trunk upright.     Transfers Overall transfer level: Needs assistance Equipment used: Rolling walker (2 wheeled) Transfers: Sit to/from [employer] Sit to Stand: Mod assist;+2 physical assistance;+2 safety/equipment;From elevated surface Stand pivot transfers: Total assist       General transfer comment: Mod Assist +2 from elevated EOB to stand with RW. Cues for safe hand placement on RW for power up and mod to steady in standing. Pt unable to take steps with Lt LE and shuffling Rt LE on floor to attempt stand pivot with RW; unable to. 3x sit<>stand with RW completed for pt to use bed pan in sitting to void bladder. Corene Cornea used for bed<>chair transfer.  Ambulation/Gait         Gait velocity: decr   General Gait Details: pt unable to take small side steps at EOB.  Stairs            Wheelchair Mobility    Modified Rankin (Stroke Patients Only)       Balance Overall balance assessment: Needs assistance Sitting-balance support: Feet supported;Bilateral upper extremity supported;No upper extremity supported Sitting balance-Leahy Scale: Fair Sitting balance - Comments: pt with posterior lean attempting to raise Lt LE to don shoe.   Standing balance support: Bilateral upper extremity supported Standing balance-Leahy Scale: Poor Standing balance comment: reliant on external support  Pertinent Vitals/Pain Pain Assessment: Faces Faces Pain Scale: Hurts little more Pain Location: LE's with movement and touch Pain Descriptors / Indicators: Discomfort Pain Intervention(s): Limited activity within patient's tolerance;Monitored during session;Repositioned    Home Living Family/patient expects to be discharged to:: Private  residence Living Arrangements: Children (pt reports lives with her son (he works during the day)) Available Help at Discharge: Family Type of Home: House Home Access: Ramped entrance     Home Layout: One level Home Equipment: Environmental consultant - 2 wheels;Cane - single point;Shower seat;Grab bars - toilet;Hand held shower head      Prior Function Level of Independence: Independent with assistive device(s);Needs assistance   Gait / Transfers Assistance Needed: pt uses RW for mobility in home and in community for short distances.   ADL's / Homemaking Assistance Needed: pt reports is independent with making her own meals typically, he daughter in-law cooks sometimes. She reports independence with dressing and bathing and sits on a shower seat in the tub.         Hand Dominance        Extremity/Trunk Assessment   Upper Extremity Assessment Upper Extremity Assessment: Defer to OT evaluation    Lower Extremity Assessment Lower Extremity Assessment: Generalized weakness;RLE deficits/detail;LLE deficits/detail RLE Deficits / Details: limited testing in supine due to c/o pain. 2-/5 for ankle dorsi/plantar flexion. 1/5 for hip flexion and 2-/5 for hip adduction/abduction. 3-/5 for quad strength sitting EOB. RLE: Unable to fully assess due to pain LLE Deficits / Details: limited testing in supine due to c/o pain. 2-/5 for ankle dorsi/plantar flexion. 1/5 for hip flexion and 2-/5 for hip adduction/abduction. 3-/5 for quad strength sitting EOB. LLE: Unable to fully assess due to pain    Cervical / Trunk Assessment Cervical / Trunk Assessment: Other exceptions Cervical / Trunk Exceptions: large habitus  Communication   Communication: No difficulties  Cognition Arousal/Alertness: Awake/alert Behavior During Therapy: WFL for tasks assessed/performed Overall Cognitive Status: Within Functional Limits for tasks assessed                                 General Comments: pt overall  WFL's for conversations, A&Ox 4. She had tendency to want to slow down, "wait a minute" and c/o of LE pain.       General Comments      Exercises     Assessment/Plan    PT Assessment Patient needs continued PT services  PT Problem List Decreased strength;Decreased activity tolerance;Decreased balance;Decreased mobility;Decreased knowledge of use of DME;Decreased safety awareness;Decreased knowledge of precautions       PT Treatment Interventions DME instruction;Gait training;Stair training;Functional mobility training;Therapeutic activities;Therapeutic exercise;Balance training;Patient/family education    PT Goals (Current goals can be found in the Care Plan section)  Acute Rehab PT Goals Patient Stated Goal: regain strength and independence PT Goal Formulation: With patient Time For Goal Achievement: 01/18/20 Potential to Achieve Goals: Good    Frequency Min 3X/week   Barriers to discharge        Co-evaluation PT/OT/SLP Co-Evaluation/Treatment: Yes Reason for Co-Treatment: For patient/therapist safety;To address functional/ADL transfers PT goals addressed during session: Mobility/safety with mobility;Balance;Proper use of DME         AM-PAC PT "6 Clicks" Mobility  Outcome Measure Help needed turning from your back to your side while in a flat bed without using bedrails?: A Lot Help needed moving from lying on your back to sitting on the side of a  flat bed without using bedrails?: Total Help needed moving to and from a bed to a chair (including a wheelchair)?: Total Help needed standing up from a chair using your arms (e.g., wheelchair or bedside chair)?: A Lot Help needed to walk in hospital room?: Total Help needed climbing 3-5 steps with a railing? : Total 6 Click Score: 8    End of Session Equipment Utilized During Treatment: Gait belt Activity Tolerance: Patient tolerated treatment well Patient left: in chair;with call bell/phone within reach;with chair alarm  set;with family/visitor present Nurse Communication: Mobility status;Need for lift equipment PT Visit Diagnosis: Muscle weakness (generalized) (M62.81);Difficulty in walking, not elsewhere classified (R26.2)    Time: 1308-6578 PT Time Calculation (min) (ACUTE ONLY): 56 min   Charges:   PT Evaluation $PT Eval Moderate Complexity: 1 Mod PT Treatments $Therapeutic Activity: 8-22 mins        Wynn Maudlin, DPT Acute Rehabilitation Services  Office [phone] Pager [phone]  01/04/2020 1:43 PM

## 2020-01-04 NOTE — Progress Notes (Signed)
PROGRESS NOTE    Rebecca [name]  QQV:[id number] DOB: Jul 01, 1932 DOA: 01/02/2020 PCP: Pincus Sanes, MD   Chief Complaint  Patient presents with  . Hyperglycemia  . Weakness    Brief Narrative:  Rebecca [name] is an 86 yo female with PMH IDDM, HTN, CKD3a, HLD, GERD, CAD, chronic LE edema (PRN hctz at home) who presented to the ER from home due to SOB and worsening swelling in her legs. She also has had some varying glucose levels it appears and glucose level in the ER noted to be (261, 308).  She underwent workup with CXR and was found to have signs of volume overload with some pulmonary edema noted.  Her trop was initially 47 and on repeat was the same, 47.  BNP elevated 332.  EKG shows sinus rhythm with PVCs, no obvious ischemic changes.  No prior echo in epic, but patient states she was followed historically by Dr. Riley Kill and then Dr. Shirlee Latch but she cannot remember her last echo.   She is Rebecca [name] slightly poor historian. Her son accompanies her in the ER but cannot provide much collateral information. Typically the patient lives alone she says and uses Rebecca [name] cane for assistance with ambulation but over the past several days has not been able to ambulate out of bed and her son had to carry her to the bathroom she says. When asking her why she cannot ambulate she states that her swelling is preventing her from "feeling her feet" which is making her balance off.  She was treated with IV lasix 20 mg x 1 in the ER. An in and out cath was also performed. She is admitted for further diuresis and work-up of CHF.   I have personally briefly reviewed patient's old medical records in [facility] and discussed patient with the ER provider when appropriate/indicated.  Assessment & Plan:   Principal Problem:   SOB (shortness of breath) Active Problems:   Essential hypertension   CAD (coronary artery disease)   Diabetes (HCC)   Chronic kidney disease, stage 3a (HCC)   Microcytic  anemia   Chronic edema   CHF (congestive heart failure) (HCC)   Physical deconditioning  Bilateral Lower Extremity Weakness Previously able to ambulate and get around with walker, but for about the past week, unable to ambulate or turn herself in bed She describes decreased sensation to bilateral inner thighs? (L more prominent, says R thigh normal today - inconsistent) - no bowel/bladder incontinence Bladder scan q6 (retaining yesterday, voided without need for foley) ? If weakness changes related to edema and HF and/or hyperthyroidism, but will also obtain MRI lumbar spine/brain given concerning symptoms MRI brain/lumbar spine -> notable for multilevel degenerative changes of lumbar spine superimposed on Rebecca [name] congenitally narrow canal.  Findings most pronounced at L2-3 level where there is severe canal stenosis and moderate to severe R foraminal stenosis (see report).  MRI brain without acute finding. Neurosurgery c/s - pt not interested in additional imaging or surgery at this time, will follow with neurosurgery PT/OT  Hyperthyroidism TSH low, follow free T3/4.  Follow TSI. No palpable goiter.  No lid lag or proptosis.  She notes heat intolerance, decreased appetite.  Bilateral LE edema.  No tachycardia.   Doesn't take biotin or hair or nail supplement.  She takes women's mvi, will ask her to bring this in, may need to repeat labs in [name] few days.  Fever:  Follow repeat CXR.  Follow blood and urine cx.  Shortness of  Breath  Bilateral Lower Extremity Edema  Concern for HF  Elevated Troponin: CXR with mild cardiomegaly and pulm vascular congestion BNP 332 Currently on RA, she denies significant SOB to me this AM Edema is chronic and has seen [facility] 08/2019 and recommended elevation, compression stockings Strict I/O, daily weights Lasix 40 mg IV BID Follow Echocardiogram - EF 60-65%, mild asymmetric LV hypertrophy, no RWMA, grade II diastolic dysfunction, small pericardial effusion (see  report) Follow LE US Consider cardiology c/s Hyperthyroidism could explain LE edema and SOB and HF  Physical Deconditioning Likely 2/2 above  Coronary Artery Disease last seen by Dr. Shirlee LatchMcLean 11/01/14; she has history of PCI with 2 stents in the 1990s Echo as above, TSH as above, lipid (LDL 54, HDL 45), A1c (6 in 11/2019) Asa, statin, acei  Microcytic Anemia Labs show iron def anemia  CKD IIIa Baseline ~1.1-1.2 Continue to follow  T2DM  Follow A1c, continue SSI Add levemir Continue to monitor  Hypertension Holding amlodipine with edema Follow on Acei Holding imdur   DVT prophylaxis: lovenox Code Status: fulll  Family Communication: daughter in law at bedside Disposition:   Status is: Inpatient  Remains inpatient appropriate because:Inpatient level of care appropriate due to severity of illness   Dispo: The patient is from: Home              Anticipated d/c is to: Home              Anticipated d/c date is: > 3 days              Patient currently is not medically stable to d/c.  Consultants:   none  Procedures:   None   Antimicrobials:  Anti-infectives (From admission, onward)   None     Subjective: No new complaints today   Objective: Vitals:   01/03/20 1707 01/03/20 2051 01/04/20 0403 01/04/20 0700  BP: (!) 126/53 (!) 108/43 (!) 137/55   Pulse: 95 86 97   Resp:  18 18   Temp: 100.1 F (37.8 C) 98.5 F (36.9 C) (!) 101 F (38.3 C) (!) 100.8 F (38.2 C)  TempSrc: Oral Oral Oral Oral  SpO2: 93% 97% 92%   Weight:      Height:        Intake/Output Summary (Last 24 hours) at 01/04/2020 0927 Last data filed at 01/03/2020 2200 Gross per 24 hour  Intake 300 ml  Output 900 ml  Net -600 ml   Filed Weights   01/03/20 0552  Weight: 87.2 kg    Examination:  General: No acute distress. No goiter or nodules Cardiovascular: Heart sounds show Rebecca [name] regular rate, and rhythm.  Lungs: Clear to auscultation bilaterally Abdomen: Soft, nontender,  nondistended Neurological: Alert and oriented 3. Moves all extremities 4.  Bilateral LE weakness ~4/5. Cranial nerves II through XII grossly intact. Skin: Warm and dry. No rashes or lesions. Extremities: No clubbing or cyanosis. Bilateral LE weakness.   Data Reviewed: I have personally reviewed following labs and imaging studies  CBC: Recent Labs  Lab 01/02/20 2250 01/03/20 0541 01/04/20 0528  WBC 6.6 6.6 8.8  NEUTROABS 3.8 4.1 6.9  HGB 9.8* 9.9* 10.6*  HCT 30.6* 30.3* 31.6*  MCV 78.9* 77.9* 77.3*  PLT 202 221 215    Basic Metabolic Panel: Recent Labs  Lab 01/02/20 2250 01/03/20 0541 01/04/20 0528  NA 134* 135 135  K 4.5 4.2 4.1  CL 102 102 100  CO2 22 22 25   GLUCOSE 308* 292* 244*  BUN 37* 36* 35*  CREATININE 1.13* 0.96 0.96  CALCIUM 8.9 9.0 9.0  MG  --  1.9 1.7  PHOS  --   --  3.5    GFR: Estimated Creatinine Clearance: 44.8 mL/min (by C-G formula based on SCr of 0.96 mg/dL).  Liver Function Tests: Recent Labs  Lab 01/02/20 2250 01/04/20 0528  AST 20 30  ALT 19 22  ALKPHOS 49 66  BILITOT 0.4 0.9  PROT 6.7 6.6  ALBUMIN 3.0* 2.7*    CBG: Recent Labs  Lab 01/03/20 0748 01/03/20 1207 01/03/20 1704 01/03/20 2050 01/04/20 0735  GLUCAP 278* 245* 343* 221* 216*     Recent Results (from the past 240 hour(s))  Respiratory Panel by RT PCR (Flu Geryl Dohn&B, Covid) - Nasopharyngeal Swab     Status: None   Collection Time: 01/02/20 10:52 PM   Specimen: Nasopharyngeal Swab  Result Value Ref Range Status   SARS Coronavirus 2 by RT PCR NEGATIVE NEGATIVE Final    Comment: (NOTE) SARS-CoV-2 target nucleic acids are NOT DETECTED.  The SARS-CoV-2 RNA is generally detectable in upper respiratoy specimens during the acute phase of infection. The lowest concentration of SARS-CoV-2 viral copies this assay can detect is 131 copies/mL. Rima Blizzard negative result does not preclude SARS-Cov-2 infection and should not be used as the sole basis for treatment or other patient  management decisions. Elick Aguilera negative result may occur with  improper specimen collection/handling, submission of specimen other than nasopharyngeal swab, presence of viral mutation(s) within the areas targeted by this assay, and inadequate number of viral copies (<131 copies/mL). Donevan Biller negative result must be combined with clinical observations, patient history, and epidemiological information. The expected result is Negative.  Fact Sheet for Patients:  https://www.moore.com/  Fact Sheet for Healthcare Providers:  https://www.young.biz/  This test is no t yet approved or cleared by the [place] FDA and  has been authorized for detection and/or diagnosis of SARS-CoV-2 by FDA under an Emergency Use Authorization (EUA). This EUA will remain  in effect (meaning this test can be used) for the duration of the COVID-19 declaration under Section 564(b)(1) of the Act, 21 U.S.C. section 360bbb-3(b)(1), unless the authorization is terminated or revoked sooner.     Influenza Tyjai Matuszak by PCR NEGATIVE NEGATIVE Final   Influenza B by PCR NEGATIVE NEGATIVE Final    Comment: (NOTE) The Xpert Xpress SARS-CoV-2/FLU/RSV assay is intended as an aid in  the diagnosis of influenza from Nasopharyngeal swab specimens and  should not be used as Damarien Nyman sole basis for treatment. Nasal washings and  aspirates are unacceptable for Xpert Xpress SARS-CoV-2/FLU/RSV  testing.  Fact Sheet for Patients: https://www.moore.com/  Fact Sheet for Healthcare Providers: https://www.young.biz/  This test is not yet approved or cleared by the [place] FDA and  has been authorized for detection and/or diagnosis of SARS-CoV-2 by  FDA under an Emergency Use Authorization (EUA). This EUA will remain  in effect (meaning this test can be used) for the duration of the  Covid-19 declaration under Section 564(b)(1) of the Act, 21  U.S.C. section 360bbb-3(b)(1),  unless the authorization is  terminated or revoked. Performed at [facility], [address], [place], [place] [address]   Culture, blood (routine x 2)     Status: None (Preliminary result)   Collection Time: 01/03/20  6:27 PM   Specimen: BLOOD RIGHT WRIST  Result Value Ref Range Status   Specimen Description   Final    BLOOD RIGHT WRIST Performed at [facility], [address] [place], [place] [address]    Special Requests   Final    BOTTLES DRAWN AEROBIC AND ANAEROBIC Blood Culture adequate volume Performed at [facility], [address] [place], [place] [address]    Culture PENDING  Incomplete   Report Status PENDING  Incomplete         Radiology Studies: MR BRAIN WO CONTRAST  Result Date: 01/03/2020 CLINICAL DATA:  Acute stroke suspected.  Neuro deficit. EXAM: MRI HEAD WITHOUT CONTRAST TECHNIQUE: Multiplanar, multiecho pulse sequences of the brain and surrounding structures were obtained without intravenous contrast. COMPARISON:  None. FINDINGS: Brain: No acute infarction, hemorrhage, hydrocephalus, extra-axial collection or mass lesion. Generalized cortical atrophy. Mild chronic small vessel ischemia in the periventricular white matter for age. Remote lacunar infarct at the right thalamus. Small remote right cerebellar infarct. Vascular: Preserved flow voids Skull and upper cervical spine: Normal marrow signal Sinuses/Orbits: Bilateral cataract resection IMPRESSION: 1. No acute finding, including infarct. 2. Cortical atrophy and chronic small vessel disease. Electronically Signed   By: Marnee Spring M.D.   On: 01/03/2020 12:02   MR LUMBAR SPINE WO CONTRAST  Result Date: 01/03/2020 CLINICAL DATA:  Low back pain with bilateral leg weakness EXAM: MRI LUMBAR SPINE WITHOUT CONTRAST TECHNIQUE: Multiplanar, multisequence MR imaging of the lumbar spine was performed. No intravenous contrast was administered. COMPARISON:  X-ray  11/06/2018 FINDINGS: Segmentation: Suspect transitional lumbosacral anatomy with partial sacralization of the L5 segment. Grade 1 anterolisthesis is present at the L4-5 level, in keeping with numbering convention established on previous radiographs. Alignment:  5 mm grade 1 anterolisthesis L4 on L5. Vertebrae: No fracture, evidence of discitis, or bone lesion. Mild diffuse intrinsic canal narrowing on the basis of congenitally short pedicles. Conus medullaris and cauda equina: Conus extends to the T12 level. Conus and cauda equina appear within normal limits. Paraspinal and other soft tissues: Cortically based T2 hyperintense lesionswithin the left kidney, incompletely characterized, but most likely represent cysts. Moderate distention of the urinary bladder is partially visualized. Disc levels: T11-T12: Sagittal sequences only. Moderate diffuse disc bulge with mild canal stenosis, moderate left and mild-to-moderate right foraminal stenosis. T12-L1: Sagittal sequences only. Mild circumferential disc bulge without evidence of foraminal or canal stenosis. L1-L2: Mild circumferential disc bulge with bilateral facet arthropathy and ligamentum flavum buckling resulting in mild-to-moderate canal stenosis with mild bilateral foraminal stenosis. L2-L3: Diffuse disc bulge with advanced bilateral facet arthropathy and ligamentum flavum buckling resulting in severe canal stenosis with moderate to severe right foraminal stenosis. L3-L4: Postsurgical changes to the left lamina. Mild diffuse disc bulge with bilateral facet arthropathy and ligamentum flavum buckling resulting in mild canal stenosis with moderate to severe right and mild-to-moderate left foraminal stenosis. L4-L5: Postsurgical changes to the left lamina. Disc uncovering with mild diffuse disc bulge. Bilateral facet arthropathy. No canal stenosis. Moderate bilateral foraminal stenosis, left worse than right. L5-S1: Mild posterior osteophytic spurring without  foraminal or canal stenosis. IMPRESSION: 1. Multilevel degenerative changes of the lumbar spine superimposed on Tyeisha Dinan congenitally narrow canal. Findings are most pronounced at the L2-3 level where there is severe canal stenosis and moderate-to-severe right foraminal stenosis. 2. Additional levels of mild-to-moderate canal stenosis, as above. 3. Moderate-to-severe right foraminal stenosis at L3-4. 4. Moderate bilateral foraminal stenosis at L4-5. 5. Moderate distention of the urinary bladder. Correlate for urinary retention. Electronically Signed   By: Duanne Guess D.O.   On: 01/03/2020 11:49   DG CHEST PORT 1 VIEW  Result Date: 01/04/2020 CLINICAL  DATA:  86 year old female with shortness of breath EXAM: PORTABLE CHEST 1 VIEW COMPARISON:  Prior chest x-ray 01/02/2020 FINDINGS: Increasing streaky airspace opacities in the left lower lobe partially obscuring the left hemidiaphragm. Stable cardiomegaly. Atherosclerotic calcifications again noted in the transverse aorta. No pulmonary edema, large pleural effusion or pneumothorax. Minimal atelectasis versus scarring in the right lung base, unchanged. No acute osseous abnormality. IMPRESSION: 1. Increasing streaky airspace opacities in the left lower lobe which may reflect atelectasis and/or infiltrate. 2. Stable cardiomegaly. 3. Aortic calcifications. Electronically Signed   By: Malachy Moan M.D.   On: 01/04/2020 09:15   DG Chest Port 1 View  Result Date: 01/02/2020 CLINICAL DATA:  Weakness EXAM: PORTABLE CHEST 1 VIEW COMPARISON:  None. FINDINGS: The heart size and mediastinal contours are mildly enlarged. Aortic knob calcifications are seen. There is prominence of the central pulmonary vasculature. Both lungs are clear. The visualized skeletal structures are unremarkable. IMPRESSION: Mild cardiomegaly and pulmonary vascular congestion. Electronically Signed   By: Jonna Clark M.D.   On: 01/02/2020 23:18   ECHOCARDIOGRAM COMPLETE  Result Date:  01/03/2020    ECHOCARDIOGRAM REPORT   Patient Name:   DAVIELLE LINGELBACH [facility] Date of Exam: 01/03/2020 Medical Rec #:  [id number]            Height:       63.0 in Accession #:    [id number]           Weight:       192.2 lb Date of Birth:  January 04, 1933            BSA:          1.901 m Patient Age:    87 years             BP:           140/60 mmHg Patient Gender: F                    HR:           105 bpm. Exam Location:  Inpatient Procedure: 2D Echo, Color Doppler and Cardiac Doppler Indications:    I50.9* Heart failure (unspecified)  History:        Patient has no prior history of Echocardiogram examinations.                 CHF, CAD; Risk Factors:Hypertension, Diabetes, Dyslipidemia and                 Sickle Cell.  Sonographer:    Irving Burton Senior RDCS Referring Phys: [phone] DAVID GIRGUIS  Sonographer Comments: Technically difficult due to poor echo windows; patient unable to tolerate any probe pressure. IMPRESSIONS  1. Left ventricular ejection fraction, by estimation, is 60 to 65%. The left ventricle has normal function. The left ventricle has no regional wall motion abnormalities. There is mild asymmetric left ventricular hypertrophy of the basal-septal segment. Left ventricular diastolic parameters are consistent with Grade II diastolic dysfunction (pseudonormalization). Elevated left atrial pressure.  2. Right ventricular systolic function is normal. The right ventricular size is normal.  3. Tracy Kinner small pericardial effusion is present. The pericardial effusion is circumferential. There is no evidence of cardiac tamponade.  4. The mitral valve is grossly normal. Mild mitral valve regurgitation. No evidence of mitral stenosis.  5. The aortic valve is tricuspid. Aortic valve regurgitation is not visualized. No aortic stenosis is present. FINDINGS  Left Ventricle: Left ventricular ejection fraction, by estimation, is 60 to 65%. The left  ventricle has normal function. The left ventricle has no regional wall motion abnormalities.  The left ventricular internal cavity size was normal in size. There is  mild asymmetric left ventricular hypertrophy of the basal-septal segment. Left ventricular diastolic parameters are consistent with Grade II diastolic dysfunction (pseudonormalization). Elevated left atrial pressure. Right Ventricle: The right ventricular size is normal. No increase in right ventricular wall thickness. Right ventricular systolic function is normal. Left Atrium: Left atrial size was normal in size. Right Atrium: Right atrial size was normal in size. Pericardium: Cuca Benassi small pericardial effusion is present. The pericardial effusion is circumferential. There is no evidence of cardiac tamponade. Mitral Valve: The mitral valve is grossly normal. Mild mitral valve regurgitation. No evidence of mitral valve stenosis. Tricuspid Valve: The tricuspid valve is grossly normal. Tricuspid valve regurgitation is mild . No evidence of tricuspid stenosis. Aortic Valve: The aortic valve is tricuspid. Aortic valve regurgitation is not visualized. No aortic stenosis is present. Pulmonic Valve: The pulmonic valve was grossly normal. Pulmonic valve regurgitation is trivial. No evidence of pulmonic stenosis. Aorta: The aortic root and ascending aorta are structurally normal, with no evidence of dilitation. Venous: The inferior vena cava was not well visualized. IAS/Shunts: The atrial septum is grossly normal.  LEFT VENTRICLE PLAX 2D LVIDd:         4.00 cm  Diastology LVIDs:         3.00 cm  LV e' medial:    4.68 cm/s LV PW:         0.90 cm  LV E/e' medial:  15.9 LV IVS:        1.20 cm  LV e' lateral:   4.24 cm/s LVOT diam:     2.00 cm  LV E/e' lateral: 17.6 LV SV:         51 LV SV Index:   27 LVOT Area:     3.14 cm  RIGHT VENTRICLE RV S prime:     23.40 cm/s TAPSE (M-mode): 2.7 cm LEFT ATRIUM           Index       RIGHT ATRIUM           Index LA diam:      2.10 cm 1.10 cm/m  RA Area:     13.50 cm LA Vol (A2C): 23.8 ml 12.52 ml/m RA Volume:   29.90 ml   15.73 ml/m LA Vol (A4C): 48.8 ml 25.67 ml/m  AORTIC VALVE LVOT Vmax:   87.50 cm/s LVOT Vmean:  67.100 cm/s LVOT VTI:    0.162 m  AORTA Ao Root diam: 3.30 cm Ao Asc diam:  3.20 cm MITRAL VALVE                TRICUSPID VALVE MV Area (PHT): 4.96 cm     TR Peak grad:   42.0 mmHg MV Decel Time: 153 msec     TR Vmax:        324.00 cm/s MV E velocity: 74.60 cm/s MV Rainee Sweatt velocity: 105.00 cm/s  SHUNTS MV E/Broc Caspers ratio:  0.71         Systemic VTI:  0.16 m                             Systemic Diam: 2.00 cm Lennie Odor MD Electronically signed by Lennie Odor MD Signature Date/Time: 01/03/2020/10:31:23 AM    Final         Scheduled Meds: . aspirin EC  81 mg Oral Daily  . brimonidine  1 drop Right Eye BID  . dorzolamide-timolol  1 drop Right Eye BID  . enoxaparin (LOVENOX) injection  40 mg Subcutaneous Q24H  . furosemide  40 mg Intravenous BID  . gabapentin  100 mg Oral Daily  . gabapentin  200 mg Oral QHS  . influenza vaccine adjuvanted  0.5 mL Intramuscular Tomorrow-1000  . insulin aspart  0-15 Units Subcutaneous TID AC & HS  . insulin detemir  7 Units Subcutaneous BID  . latanoprost  1 drop Right Eye QHS  . lisinopril  40 mg Oral Daily  . multivitamin with minerals  1 tablet Oral Daily  . sodium chloride flush  3 mL Intravenous Q12H   Continuous Infusions: . sodium chloride       LOS: 1 day    Time spent: over 30 min    Lacretia Nicks, MD [facility]   To contact the attending provider between 7A-7P or the covering provider during after hours 7P-7A, please log into the web site [email or web address] and access using universal [place] password for that web site. If you do not have the password, please call the hospital operator.  01/04/2020, 9:27 AM

## 2020-01-04 NOTE — Evaluation (Signed)
Occupational Therapy Evaluation Patient Details Name: Rebecca [name] MRN: [id number] DOB: Nov 22, 1932 Today's Date: 01/04/2020    History of Present Illness Rebecca [name] is an 86 yo female with PMH IDDM, HTN, CKD3a, HLD, GERD, CAD, chronic [name] edema (PRN hctz at home) who presented to the ER from home due to SOB and worsening swelling in her legs and difficulty ambulating.MRI lumbar spine notable for multilevel degenerative changes of lumbar spine superimposed on a congenitally narrow canal ( Neurosurgery c/s - pt not interested in additional imaging or surgery at this time) and MRI of brain: no acute findings.   Clinical Impression   This 86 yo female admitted with above presents to acute OT with PLOF up until 2 weeks ago of being able to take care of herself and do her own bathing/dressing/toileting/ambulating with walker. Currently she is setup/S- total A for all basic ADLs, max A +2 bed mobility, Mod A +2 sit<>stand, and unable to move her feet once she is in standing to pivot. She will benefit from acute OT with follow up at SNF.    Follow Up Recommendations  SNF;Supervision/Assistance - 24 hour    Equipment Recommendations  Other (comment) (TBD next venue)       Precautions / Restrictions Precautions Precautions: Fall Restrictions Weight Bearing Restrictions: No      Mobility Bed Mobility Overal bed mobility: Needs Assistance Bed Mobility: Supine to Sit     Supine to sit: HOB elevated;+2 for safety/equipment;Max assist;+2 for physical assistance     General bed mobility comments: pt taking increased time to complete, cues to walk bil [name]'s towards EOB and using bed rail to rasie turnk. Max Assist +2 with use of bed pad to pivot and raise trunk upright.     Transfers Overall transfer level: Needs assistance Equipment used: Rolling walker (2 wheeled) Transfers: Sit to/from [employer] Sit to Stand: Mod assist;+2 physical assistance;+2  safety/equipment;From elevated surface Stand pivot transfers: Total assist       General transfer comment: Mod Assist +2 from elevated EOB to stand with RW. Cues for safe hand placement on RW for power up and mod to steady in standing. Pt unable to take steps with Rebecca [name] and shuffling Rebecca [name] on floor to attempt stand pivot with RW; unable to. 3x sit<>stand with RW completed for pt to use bed pan in sitting to void bladder. Corene Cornea used for bed<>chair transfer.    Balance Overall balance assessment: Needs assistance Sitting-balance support: Feet supported;Bilateral upper extremity supported;No upper extremity supported Sitting balance-Rebecca [name]: Fair Sitting balance - Comments: pt with posterior lean attempting to raise Rebecca [name] to don shoe.   Standing balance support: Bilateral upper extremity supported Standing balance-Rebecca [name]: Poor Standing balance comment: reliant on external support                           ADL either performed or assessed with clinical judgement   ADL Overall ADL's : Needs assistance/impaired Eating/Feeding: Independent;Sitting Eating/Feeding Details (indicate cue type and reason): EOB Grooming: Set up;Supervision/safety;Sitting Grooming Details (indicate cue type and reason): EOB Upper Body Bathing: Supervision/ safety;Set up;Sitting Upper Body Bathing Details (indicate cue type and reason): EOB Lower Body Bathing: Maximal assistance Lower Body Bathing Details (indicate cue type and reason): Mod A +2 sit<>stand Upper Body Dressing : Minimal assistance;Sitting Upper Body Dressing Details (indicate cue type and reason): EOB Lower Body Dressing: Total assistance Lower Body Dressing Details (indicate cue type and  reason): Mod A +2 sit<>stand     Toileting- Clothing Manipulation and Hygiene: Total assistance Toileting - Clothing Manipulation Details (indicate cue type and reason): Mod A +2 sit<>stand (from bed pan on bed, while pt sitting EOB)              Vision Patient Visual Report: No change from baseline              Pertinent Vitals/Pain Pain Assessment: Faces Faces Pain [name]: Hurts little more Pain Location: [name]'s with movement and touch Pain Descriptors / Indicators: Discomfort Pain Intervention(s): Limited activity within patient's tolerance;Monitored during session;Repositioned     Hand Dominance     Extremity/Trunk Assessment Upper Extremity Assessment Upper Extremity Assessment: Generalized weakness   Lower Extremity Assessment Lower Extremity Assessment: Generalized weakness;RLE deficits/detail;LLE deficits/detail RLE Deficits / Details: limited testing in supine due to c/o pain. 2-/5 for ankle dorsi/plantar flexion. 1/5 for hip flexion and 2-/5 for hip adduction/abduction. 3-/5 for quad strength sitting EOB. RLE: Unable to fully assess due to pain LLE Deficits / Details: limited testing in supine due to c/o pain. 2-/5 for ankle dorsi/plantar flexion. 1/5 for hip flexion and 2-/5 for hip adduction/abduction. 3-/5 for quad strength sitting EOB. LLE: Unable to fully assess due to pain   Cervical / Trunk Assessment Cervical / Trunk Assessment: Other exceptions Cervical / Trunk Exceptions: large habitus   Communication Communication Communication: No difficulties   Cognition Arousal/Alertness: Awake/alert Behavior During Therapy: WFL for tasks assessed/performed Overall Cognitive Status: Within Functional Limits for tasks assessed                                 General Comments: pt overall WFL's for conversations, A&Ox 4. She had tendency to want to slow down, "wait a minute" and c/o of [name] pain.               Home Living Family/patient expects to be discharged to:: Private residence Living Arrangements: Children (pt reports lives with her son (he works during the day)) Available Help at Discharge: Family Type of Home: House Home Access: Ramped entrance     Home Layout: One  level     Bathroom Shower/Tub: Chief Strategy Officer: Standard Bathroom Accessibility: Yes   Home Equipment: Environmental consultant - 2 wheels;Cane - single point;Shower seat;Grab bars - toilet;Hand held shower head          Prior Functioning/Environment Level of Independence: Independent with assistive device(s);Needs assistance  Gait / Transfers Assistance Needed: pt uses RW for mobility in home and in community for short distances.  ADL's / Homemaking Assistance Needed: pt reports is independent with making her own meals typically, he daughter in-law cooks sometimes. She reports independence with dressing and bathing and sits on a shower seat in the tub.             OT Problem List: Decreased strength;Impaired balance (sitting and/or standing);Obesity;Pain      OT Treatment/Interventions: Self-care/ADL training;DME and/or AE instruction;Patient/family education;Balance training    OT Goals(Current goals can be found in the care plan section) Acute Rehab OT Goals Patient Stated Goal: regain strength and independence OT Goal Formulation: With patient Time For Goal Achievement: 01/18/20 Potential to Achieve Goals: Good  OT Frequency: Min 2X/week           Co-evaluation PT/OT/SLP Co-Evaluation/Treatment: Yes Reason for Co-Treatment: For patient/therapist safety;To address functional/ADL transfers PT goals addressed during session: Mobility/safety with mobility;Balance;Proper  use of DME OT goals addressed during session: ADL's and self-care;Strengthening/ROM      AM-PAC OT "6 Clicks" Daily Activity     Outcome Measure Help from another person eating meals?: None Help from another person taking care of personal grooming?: A Little Help from another person toileting, which includes using toliet, bedpan, or urinal?: A Lot Help from another person bathing (including washing, rinsing, drying)?: A Lot Help from another person to put on and taking off regular upper body  clothing?: A Lot Help from another person to put on and taking off regular lower body clothing?: Total 6 Click Score: 14   End of Session Equipment Utilized During Treatment: Gait belt;Rolling walker Nurse Communication: Mobility status;Need for lift equipment (nurse tech, sara stedy +2)  Activity Tolerance: Patient tolerated treatment well Patient left: in chair;with call bell/phone within reach;with chair alarm set  OT Visit Diagnosis: Unsteadiness on feet (R26.81);Other abnormalities of gait and mobility (R26.89);Muscle weakness (generalized) (M62.81);Pain Pain - Right/Left:  (both) Pain - part of body: Leg                Time: 1102-1201 OT Time Calculation (min): 59 min Charges:  OT General Charges $OT Visit: 1 Visit OT Evaluation $OT Eval Moderate Complexity: 1 Mod OT Treatments $Self Care/Home Management : 8-22 mins  Ignacia Palma, OTR/L Acute [employer] Pager [phone] Office [phone]     Evette Georges 01/04/2020, 1:45 PM

## 2020-01-05 ENCOUNTER — Inpatient Hospital Stay (HOSPITAL_COMMUNITY): Payer: Medicare Other

## 2020-01-05 DIAGNOSIS — R0602 Shortness of breath: Secondary | ICD-10-CM | POA: Diagnosis not present

## 2020-01-05 LAB — HIV ANTIBODY (ROUTINE TESTING W REFLEX): HIV Screen 4th Generation wRfx: NONREACTIVE

## 2020-01-05 LAB — COMPREHENSIVE METABOLIC PANEL
ALT: 23 U/L (ref 0–44)
AST: 18 U/L (ref 15–41)
Albumin: 2.5 g/dL — ABNORMAL LOW (ref 3.5–5.0)
Alkaline Phosphatase: 70 U/L (ref 38–126)
Anion gap: 11 (ref 5–15)
BUN: 48 mg/dL — ABNORMAL HIGH (ref 8–23)
CO2: 26 mmol/L (ref 22–32)
Calcium: 9.1 mg/dL (ref 8.9–10.3)
Chloride: 98 mmol/L (ref 98–111)
Creatinine, Ser: 0.92 mg/dL (ref 0.44–1.00)
GFR, Estimated: >60 mL/min (ref >60)
Glucose, Bld: 338 mg/dL — ABNORMAL HIGH (ref 70–99)
Potassium: 4.2 mmol/L (ref 3.5–5.1)
Sodium: 135 mmol/L (ref 135–145)
Total Bilirubin: 0.7 mg/dL (ref 0.3–1.2)
Total Protein: 6.4 g/dL — ABNORMAL LOW (ref 6.5–8.1)

## 2020-01-05 LAB — CBC WITH DIFFERENTIAL/PLATELET
Abs Immature Granulocytes: 0.03 10*3/uL (ref 0.00–0.07)
Basophils Absolute: 0 10*3/uL (ref 0.0–0.1)
Basophils Relative: 0 %
Eosinophils Absolute: 0 10*3/uL (ref 0.0–0.5)
Eosinophils Relative: 0 %
HCT: 31.4 % — ABNORMAL LOW (ref 36.0–46.0)
Hemoglobin: 10.5 g/dL — ABNORMAL LOW (ref 12.0–15.0)
Immature Granulocytes: 0 %
Lymphocytes Relative: 7 %
Lymphs Abs: 0.7 10*3/uL (ref 0.7–4.0)
MCH: 25.9 pg — ABNORMAL LOW (ref 26.0–34.0)
MCHC: 33.4 g/dL (ref 30.0–36.0)
MCV: 77.3 fL — ABNORMAL LOW (ref 80.0–100.0)
Monocytes Absolute: 0.5 10*3/uL (ref 0.1–1.0)
Monocytes Relative: 5 %
Neutro Abs: 8.4 10*3/uL — ABNORMAL HIGH (ref 1.7–7.7)
Neutrophils Relative %: 88 %
Platelets: 235 10*3/uL (ref 150–400)
RBC: 4.06 MIL/uL (ref 3.87–5.11)
RDW: 15.3 % (ref 11.5–15.5)
WBC: 9.6 10*3/uL (ref 4.0–10.5)
nRBC: 0 % (ref 0.0–0.2)

## 2020-01-05 LAB — THYROID STIMULATING IMMUNOGLOBULIN: Thyroid Stimulating Immunoglob: 3.08 IU/L — ABNORMAL HIGH (ref 0.00–0.55)

## 2020-01-05 LAB — GLUCOSE, CAPILLARY
Glucose-Capillary: 322 mg/dL — ABNORMAL HIGH (ref 70–99)
Glucose-Capillary: 336 mg/dL — ABNORMAL HIGH (ref 70–99)
Glucose-Capillary: 227 mg/dL — ABNORMAL HIGH (ref 70–99)

## 2020-01-05 LAB — T4, FREE: Free T4: 5.01 ng/dL — ABNORMAL HIGH (ref 0.61–1.12)

## 2020-01-05 LAB — MAGNESIUM: Magnesium: 1.8 mg/dL (ref 1.7–2.4)

## 2020-01-05 LAB — PHOSPHORUS: Phosphorus: 3.8 mg/dL (ref 2.5–4.6)

## 2020-01-05 LAB — TSH: TSH: <0.01 u[IU]/mL — ABNORMAL LOW (ref 0.350–4.500)

## 2020-01-05 MED ORDER — METOPROLOL TARTRATE 5 MG/5ML IV SOLN
5.0000 mg | INTRAVENOUS | Status: AC | PRN
  Administered 2020-01-05 (×3): 5 mg via INTRAVENOUS
  Filled 2020-01-05 (×3): qty 5

## 2020-01-05 MED ORDER — HYDROCORTISONE NA SUCCINATE PF 100 MG IJ SOLR
100.0000 mg | Freq: Three times a day (TID) | INTRAMUSCULAR | Status: DC
  Administered 2020-01-05 – 2020-01-08 (×9): 100 mg via INTRAVENOUS
  Filled 2020-01-05 (×10): qty 2

## 2020-01-05 MED ORDER — DILTIAZEM LOAD VIA INFUSION
10.0000 mg | Freq: Once | INTRAVENOUS | Status: AC
  Administered 2020-01-05: 10 mg via INTRAVENOUS
  Filled 2020-01-05: qty 10

## 2020-01-05 MED ORDER — PANTOPRAZOLE SODIUM 40 MG PO TBEC
40.0000 mg | DELAYED_RELEASE_TABLET | Freq: Every day | ORAL | Status: DC
  Administered 2020-01-05 – 2020-01-11 (×7): 40 mg via ORAL
  Filled 2020-01-05 (×7): qty 1

## 2020-01-05 MED ORDER — METHIMAZOLE 10 MG PO TABS
20.0000 mg | ORAL_TABLET | ORAL | Status: DC
  Administered 2020-01-05 – 2020-01-08 (×18): 20 mg via ORAL
  Filled 2020-01-05 (×24): qty 2

## 2020-01-05 MED ORDER — ENOXAPARIN SODIUM 80 MG/0.8ML ~~LOC~~ SOLN
80.0000 mg | Freq: Two times a day (BID) | SUBCUTANEOUS | Status: DC
  Administered 2020-01-05 – 2020-01-07 (×4): 80 mg via SUBCUTANEOUS
  Filled 2020-01-05 (×4): qty 0.8

## 2020-01-05 MED ORDER — PROPRANOLOL HCL 20 MG PO TABS
60.0000 mg | ORAL_TABLET | Freq: Four times a day (QID) | ORAL | Status: DC
  Administered 2020-01-05 (×2): 60 mg via ORAL
  Filled 2020-01-05 (×3): qty 3

## 2020-01-05 MED ORDER — PROPRANOLOL HCL 20 MG PO TABS
30.0000 mg | ORAL_TABLET | Freq: Once | ORAL | Status: AC
  Administered 2020-01-05: 30 mg via ORAL
  Filled 2020-01-05: qty 1

## 2020-01-05 MED ORDER — DILTIAZEM HCL-DEXTROSE 125-5 MG/125ML-% IV SOLN (PREMIX)
5.0000 mg/h | INTRAVENOUS | Status: DC
  Administered 2020-01-05: 5 mg/h via INTRAVENOUS
  Filled 2020-01-05 (×2): qty 125

## 2020-01-05 MED ORDER — IODINE STRONG (LUGOLS) 5 % PO SOLN
0.2000 mL | Freq: Four times a day (QID) | ORAL | Status: DC
  Administered 2020-01-05 – 2020-01-11 (×26): 0.2 mL via ORAL
  Filled 2020-01-05 (×5): qty 0.2

## 2020-01-05 MED ORDER — INSULIN DETEMIR 100 UNIT/ML ~~LOC~~ SOLN
12.0000 [IU] | Freq: Two times a day (BID) | SUBCUTANEOUS | Status: DC
  Administered 2020-01-05 – 2020-01-06 (×4): 12 [IU] via SUBCUTANEOUS
  Filled 2020-01-05 (×5): qty 0.12

## 2020-01-05 MED ORDER — POLYETHYLENE GLYCOL 3350 17 G PO PACK
17.0000 g | PACK | Freq: Every day | ORAL | Status: DC
  Administered 2020-01-05 – 2020-01-07 (×3): 17 g via ORAL
  Filled 2020-01-05 (×4): qty 1

## 2020-01-05 MED ORDER — PROPRANOLOL HCL 20 MG PO TABS
30.0000 mg | ORAL_TABLET | Freq: Four times a day (QID) | ORAL | Status: DC
  Administered 2020-01-05: 30 mg via ORAL
  Filled 2020-01-05: qty 1

## 2020-01-05 MED ORDER — INSULIN ASPART 100 UNIT/ML ~~LOC~~ SOLN
4.0000 [IU] | Freq: Three times a day (TID) | SUBCUTANEOUS | Status: DC
  Administered 2020-01-05 – 2020-01-06 (×6): 4 [IU] via SUBCUTANEOUS

## 2020-01-05 MED ORDER — ENOXAPARIN SODIUM 40 MG/0.4ML ~~LOC~~ SOLN
40.0000 mg | Freq: Once | SUBCUTANEOUS | Status: AC
  Administered 2020-01-05: 40 mg via SUBCUTANEOUS
  Filled 2020-01-05: qty 0.4

## 2020-01-05 NOTE — Progress Notes (Signed)
RN notified Dr. Lowell Guitar that pt's HR is 63 and Dr. Lowell Guitar reports to stop cardizem gtt. Pt remains in NSR with PVCs.

## 2020-01-05 NOTE — TOC Progression Note (Signed)
Transition of Care [facility]) - Progression Note    Patient Details  Name: Rebecca [name] MRN: [id number] Date of Birth: 25-Apr-1932  Transition of Care [facility]) CM/SW Contact  Briggett Tuccillo, Olegario Messier, RN Phone Number: 01/05/2020, 2:10 PM  Clinical Narrative: 1. 1.4 mi [facility] [address] [place], [place] [address] [phone] Overall rating Below average 2. 1.8 mi [facility] [address] [place], [place] [address] [phone] Overall rating Much above average 3. 2.1 mi [facility] [place], [place] [address] [place], [place] [address] [phone] Overall rating Below average 4. 2.3 mi [facility] [address] [place], [place] [address] [phone] Overall rating Below average 5. 2.4 mi [facility] [address] [place], [place] [address] [phone] Overall rating Below average 6. 2.7 mi [facility] [address] [place], [place] [address] [phone] Overall rating Below average 7. 2.7 mi [facility] [address] [place], [place] [address] [phone] Overall rating Much above average 8. 2.8 mi [address] at [place], [place] [address] [place], [place] [address] [phone] Overall rating Much below average 9. 4.3 mi [facility] [address] [place], [place] [address] [phone] Overall rating Average 10. 4.7 mi [facility] [address] [place], [place] [address] [phone] Overall rating Below average 11. 5.5 mi [facility] [employer] [employer] [address] [place], [place] [address] [phone] Overall rating Much above average 12. 5.9 mi [facility] [address] [place], [place] [address] [phone] Overall  rating Much above average 13. 6.3 mi [facility] [address] [place], [place] [address] [phone] Overall rating Average 14. 7.2 mi [facility] [address] [place], [place] [address] [phone] Overall rating Above average 15. 7.7 mi [facility] [address] [place], [place] [address] [phone] Overall rating Much below average 16. 9.7 mi [facility] [address] [place], [place] [address] [phone] Overall rating Average 17. 10.1 mi [facility] [address] [place], [place] [address] [phone] Overall rating Much above average 18. 12 mi [facility] [address] [place], [place] [address] [phone] Overall rating Much above average 19. 13.3 mi [facility] [address] [place], [place] [address] [phone] Overall rating Much below average 20. 13.7 [facility] [address] [place], [place] [address] [phone] Overall rating Much below average 21. 14.6 mi [facility] [address] [place], [place] [address] [phone] Overall rating Much below average 22. 15.2 mi [facility] [address] [place], [place] [address] [phone] Overall rating Below average 23. 15.4 mi [facility] [address] [place], [place] [address] [phone] Overall rating Much below average 24. 15.5 mi [facility] [address] [place], [place] [address] [phone] Overall rating Much above average 25. 15.7 mi [facility] [address] [place], [place] [address] [phone] Overall rating Much above average 26. 16 mi [facility] [address] [place], [place] [address] [phone] Overall rating Average 27. 16.6 mi [facility] [address]  [place], [place] [address] [phone] Overall rating Average 28. 17.5 mi [facility] [address] [place], [place] [address] [phone] Overall rating Below average 29. 18.9 [facility] [address] [place], [place] [address] [phone] Overall rating Much below average 30. 18.9 mi [address] [facility] [employer] at [facility], [place] [address] [phone] Overall rating Much above average 31. 20.6 mi [facility] [address] [place], [place] [address] [phone] Overall rating Below average 32. 21.4 [facility] [address] [place], [place] [address] [phone] Overall rating Below average 33. 21.5 mi [facility] [address] [place], [place] [address] [phone] Overall rating Below average 34. 21.5 mi [facility] [address] [place], [place] [address] [phone] Overall rating Average 35. 21.6 mi [facility] [address] [place], [place] [address] [phone] Overall rating Much above average 36. 22.2 mi [facility] [address] [place], [place] [address] [phone] Overall rating Above average 37. 22.4 [facility] [address] [place], [place] [address] [phone] Overall rating Below average 38. 22.3 mi [facility] [address] [place], [place] [address] [phone] Overall rating Much below average 39. 22.7 [address] [place], [place] [address] [phone] Overall rating Much above average 40. 23.1 mi [address] [place], [place] [address] [phone] Overall rating Average 41. 23.7 [facility] [address] [place], [place] [address] [phone] Overall rating Much below average 42. 23.9 mi [facility] [address] [place], [place] [address] [phone] Overall rating Below average 43. 24.2 mi [facility] [address] [place], [place] [address] [phone] Overall rating       Expected Discharge Plan: Skilled Nursing Facility Barriers to Discharge: Continued Medical Work up  Expected Discharge Plan and Services Expected Discharge Plan: Skilled Nursing Facility   Discharge Planning Services: CM Consult   Living arrangements for the past 2 months: Single Family Home                                       Social Determinants of Health (SDOH) Interventions    Readmission Risk Interventions No flowsheet data found.

## 2020-01-05 NOTE — Progress Notes (Signed)
Notified MD that pt converted to SR with PVC's with some bigeminy noted. Pt report she feels better this evening than she did this morning. No other needs. VSS HR 73 BP 120/69. RN will continue to monitor.

## 2020-01-05 NOTE — Progress Notes (Signed)
   01/05/20 0711  Assess: MEWS Score  Temp 99.3 F (37.4 C)  BP 128/78  Pulse Rate (!) 150  Resp 20  Level of Consciousness Alert  SpO2 95 %  O2 Device Room Air  Assess: MEWS Score  MEWS Temp 0  MEWS Systolic 0  MEWS Pulse 3  MEWS RR 0  MEWS LOC 0  MEWS Score 3  MEWS Score Color Yellow  Assess: if the MEWS score is Yellow or Red  Were vital signs taken at a resting state? Yes  Focused Assessment Change from prior assessment (see assessment flowsheet)  Early Detection of Sepsis Score *See Row Information* Medium  MEWS guidelines implemented *See Row Information* Yes  Take Vital Signs  Increase Vital Sign Frequency  Yellow: Q 2hr X 2 then Q 4hr X 2, if remains yellow, continue Q 4hrs  Escalate  MEWS: Escalate Yellow: discuss with charge nurse/RN and consider discussing with provider and RRT  Notify: Charge Nurse/RN  Name of Charge Nurse/RN Notified Dagoberto Ligas, RN  Date Charge Nurse/RN Notified 01/05/20  Time Charge Nurse/RN Notified [id number]  Notify: Provider  Provider Name/Title Casimiro Needle, MD  Date Provider Notified 01/05/20  Time Provider Notified [phone]  Notification Type Page  Notification Reason Change in status (Pt in A-fib with HR up into 150's.  Pt asymptomatic)  Response See new orders  Date of Provider Response 01/05/20  Time of Provider Response [phone]

## 2020-01-05 NOTE — Progress Notes (Signed)
RN contacted Dr. Lowell Guitar about pt's afib with RVR. Current HR 158-165. MD placing IV Metoprolol orders. RN will administer and continue to monitor.

## 2020-01-05 NOTE — Progress Notes (Signed)
ANTICOAGULATION CONSULT NOTE - Initial Consult  Pharmacy Consult for Lovenox Indication: atrial fibrillation  Allergies  Allergen Reactions  . Codeine     "Makes me drunk"    Patient Measurements: Height: 5\' 3"  (160 cm) Weight: 84.8 kg (186 lb 15.2 oz) IBW/kg (Calculated) : 52.4 Heparin Dosing Weight:   Vital Signs: Temp: 99.4 F (37.4 C) (10/26 0912) Temp Source: Oral (10/26 0912) BP: 131/68 (10/26 0953) Pulse Rate: 152 (10/26 0953)  Labs: Recent Labs    01/03/20 0541 01/03/20 0541 01/03/20 1211 01/03/20 1647 01/04/20 0528 01/05/20 0505  HGB 9.9*   < >  --   --  10.6* 10.5*  HCT 30.3*  --   --   --  31.6* 31.4*  PLT 221  --   --   --  215 235  CREATININE 0.96  --   --   --  0.96 0.92  TROPONINIHS 36*  --  47* 45*  --   --    < > = values in this interval not displayed.    Estimated Creatinine Clearance: 46.2 mL/min (by C-G formula based on SCr of 0.92 mg/dL).   Medical History: Past Medical History:  Diagnosis Date  . Anemia, unspecified   . Ankle pain, left   . CAD (coronary artery disease)   . Esophageal reflux   . Fibrocystic breast disease   . Hiatal hernia   . Hypercholesteremia   . Hypertension   . IDDM (insulin dependent diabetes mellitus)    Type II  . Multinodular goiter   . Sickle-cell trait (HCC)     Medications:  Scheduled:  . aspirin EC  81 mg Oral Daily  . azithromycin  500 mg Oral Daily  . brimonidine  1 drop Right Eye BID  . dorzolamide-timolol  1 drop Right Eye BID  . enoxaparin (LOVENOX) injection  40 mg Subcutaneous Q24H  . furosemide  40 mg Intravenous BID  . gabapentin  100 mg Oral Daily  . gabapentin  200 mg Oral QHS  . hydrocortisone sod succinate (SOLU-CORTEF) inj  100 mg Intravenous Q8H  . influenza vaccine adjuvanted  0.5 mL Intramuscular Tomorrow-1000  . insulin aspart  0-20 Units Subcutaneous TID WC  . insulin aspart  0-5 Units Subcutaneous QHS  . insulin aspart  4 Units Subcutaneous TID WC  . insulin detemir  12  Units Subcutaneous BID  . latanoprost  1 drop Right Eye QHS  . lisinopril  40 mg Oral Daily  . methimazole  20 mg Oral Q4H  . multivitamin with minerals  1 tablet Oral Daily  . sodium chloride flush  3 mL Intravenous Q12H   Infusions:  . sodium chloride    . cefTRIAXone (ROCEPHIN)  IV 2 g (01/04/20 2232)  . diltiazem (CARDIZEM) infusion 5 mg/hr (01/05/20 0954)    Assessment: 87 yoF admitted on 10/23 with weakness, hyperglycemia.  She was initially started on Lovenox 40 mg daily on 10/24 for VTE prophylaxis.  Last dose on 10/26 at ~09:30.  Pharmacy is now consulted to increase to treatment dose for atrial fibrillation.    SCr 0.92 with CrCl > 30 ml/min CBC:  Hgb low/stable at 10.5, Plt WNL No s/s bleeding reported  Goal of Therapy:  Anti-Xa level 0.6-1 units/ml 4hrs after LMWH dose given Monitor platelets by anticoagulation protocol: Yes   Plan:  Give additional Lovenox 40mg  SubQ dose now, for total dose 80mg .  Then, Lovenox 1 mg/kg (rounded to 80mg ) SubQ Q12h Follow up renal function, CBC  Lynann Beaver PharmD, BCPS Clinical Pharmacist [facility] main pharmacy [phone] 01/05/2020 9:59 AM

## 2020-01-05 NOTE — Progress Notes (Signed)
RN at bedside for medication administration. Pt and daughter-in-law wanted to show this RN that pt was able to move her arms and legs significantly more than she could this morning, and the pt was so excited that she asked me to contact Dr. Lowell Guitar to let him know of her improvement, in which I did. Pt remains on Cardizem drip at 15, with a heart rate of 113. VSS. No further needs at this time. RN will continue to monitor.

## 2020-01-05 NOTE — Progress Notes (Addendum)
PROGRESS NOTE    Rebecca [name]  ZHY:[id number] DOB: Feb 07, 1933 DOA: 01/02/2020 PCP: Pincus Sanes, MD   Chief Complaint  Patient presents with  . Hyperglycemia  . Weakness    Brief Narrative:  Rebecca [name] is an 86 yo female with PMH IDDM, HTN, CKD3a, HLD, GERD, CAD, chronic LE edema (PRN hctz at home) who presented to the ER from home due to generalized weakness, SOB and worsening swelling in her legs.  At baseline, she notes she is typically able to get around with Rebecca [name] walker, but over the past 1-2 weeks, she's gotten weak to the point that she's unable to turn herself in bed.  She's been admitted and is being treated for heart failure which appears to be secondary to hyperthyroidism.  Hospitalization has been complicated by atrial fibrillation with RVR.  Neurosurgery was c/s given her lower extremity weakness and imaging concerning for stenosis.  She's currently being treated for thyroid storm and HF.   Assessment & Plan:   Principal Problem:   SOB (shortness of breath) Active Problems:   Essential hypertension   CAD (coronary artery disease)   Diabetes (HCC)   Chronic kidney disease, stage 3a (HCC)   Microcytic anemia   Chronic edema   CHF (congestive heart failure) (HCC)   Physical deconditioning  Thyroid Storm TSH suppressed, elevated free T3/T4.  Follow TSI. She takes women's MVI with biotin which could interfere with initial labs Repeat labs today 48 hrs after initial labs with suppressed TSH -> follow repeat free T3/T4 and TSI  No palpable goiter.  No lid lag or proptosis.  She notes heat intolerance, decreased appetite.  Bilateral LE edema.  She's developed atrial fibrillation with RVR 10/26 am, this in constellation with other symptoms concerning for thyroid storm (intermittent fever, atrial fibrillation with RVR with HR> 140, HF with LE edema, mild lethargy/malaise).  Methimazole, hydrocortisone, iodine Follow free T4 q3 days  Atrial Fibrillation with RVR:  chadvasc at least 5.  Echo with EF 60-65%, no RWMA (see report).  Hyperthyroidism as above. IV metop 5 mg x3 without improvement Start diltiazem gtt Lovenox for anticoagulation   Shortness of Breath  Bilateral Lower Extremity Edema  Concern for HF  Elevated Troponin: CXR with mild cardiomegaly and pulm vascular congestion BNP 332 Currently on RA, denies sig SOB LE edema has improved Edema is chronic and has seen [facility] 08/2019 and recommended elevation, compression stockings Strict I/O, daily weights Lasix 40 mg IV BID Follow Echocardiogram - EF 60-65%, mild asymmetric LV hypertrophy, no RWMA, grade II diastolic dysfunction, small pericardial effusion (see report) Follow LE [place] - negative for DVT Consider cardiology c/s Hyperthyroidism could explain LE edema and SOB and HF  Fever  SIR  Community Acquired Pneumonia:  CXR 10/26 with subtle consolidation medial L base, likely developing pneumonia.  Hyperthyroidism contributing. Continue abx Follow blood and urine cx  Bilateral Lower Extremity Weakness  Severe Spinal Canal Stenosis L2-3 Previously able to ambulate and get around with walker, but for about the past week, unable to ambulate or turn herself in bed She describes decreased sensation to bilateral inner thighs? (L more prominent, inconsistent) - no bowel/bladder incontinence Bladder scan q6 (retaining on hospital day 1, voided without need for foley) ? If weakness changes related to edema and HF and hyperthyroidism, but MRI lumbar spine/brain obtained given concerning symptoms MRI brain/lumbar spine -> notable for multilevel degenerative changes of lumbar spine superimposed on Rebecca [name] congenitally narrow canal.  Findings most pronounced at L2-3 level  where there is severe canal stenosis and moderate to severe R foraminal stenosis (see report).  MRI brain without acute finding. Neurosurgery c/s - pt not interested in additional imaging or surgery at this time, will follow with  neurosurgery  Follow sx with treatment of above PT/OT  Physical Deconditioning Likely 2/2 above -> hyperthyroidism contributing PT  Coronary Artery Disease last seen by Dr. Shirlee LatchMcLean 11/01/14; she has history of PCI with 2 stents in the 1990s Echo as above, TSH as above, lipid (LDL 54, HDL 45), A1c (6 in 11/2019) Asa, statin, acei  Microcytic Anemia Labs show iron def anemia  CKD IIIa Baseline ~1.1-1.2 Continue to follow  T2DM  Follow A1c, continue SSI Add levemir - adjust and follow  Continue to monitor  Hypertension Holding amlodipine with edema Follow on Acei Holding imdur   DVT prophylaxis: lovenox Code Status: fulll  Family Communication: daughter in law at bedside Disposition:   Status is: Inpatient  Remains inpatient appropriate because:Inpatient level of care appropriate due to severity of illness   Dispo: The patient is from: Home              Anticipated d/c is to: Home              Anticipated d/c date is: > 3 days              Patient currently is not medically stable to d/c.  Consultants:   none  Procedures:  LE US Summary:  RIGHT:  - There is no evidence of deep vein thrombosis in the lower extremity.  However, portions of this examination were limited- see technologist  comments above.    LEFT:  - There is no evidence of deep vein thrombosis in the lower extremity.  However, portions of this examination were limited- see technologist  comments above.    - Rebecca [name] cystic structure is found in the popliteal fossa.    Echo IMPRESSIONS    1. Left ventricular ejection fraction, by estimation, is 60 to 65%. The  left ventricle has normal function. The left ventricle has no regional  wall motion abnormalities. There is mild asymmetric left ventricular  hypertrophy of the basal-septal segment.  Left ventricular diastolic parameters are consistent with Grade II  diastolic dysfunction (pseudonormalization). Elevated left atrial  pressure.  2.  Right ventricular systolic function is normal. The right ventricular  size is normal.  3. Rebecca [name] small pericardial effusion is present. The pericardial effusion is  circumferential. There is no evidence of cardiac tamponade.  4. The mitral valve is grossly normal. Mild mitral valve regurgitation.  No evidence of mitral stenosis.  5. The aortic valve is tricuspid. Aortic valve regurgitation is not  visualized. No aortic stenosis is present.    Antimicrobials:  Anti-infectives (From admission, onward)   Start     Dose/Rate Route Frequency Ordered Stop   01/04/20 1900  cefTRIAXone (ROCEPHIN) 2 g in sodium chloride 0.9 % 100 mL IVPB        2 g 200 mL/hr over 30 Minutes Intravenous Every 24 hours 01/04/20 1807 01/09/20 1859   01/04/20 1900  azithromycin (ZITHROMAX) tablet 500 mg        500 mg Oral Daily 01/04/20 1807 01/09/20 0959     Subjective: No new complaints today Daughter in law notes she's less energetic than normal   Objective: Vitals:   01/05/20 0711 01/05/20 0751 01/05/20 0831 01/05/20 0846  BP: 128/78 125/72 128/61 137/69  Pulse: (!) 150 (!) 151 (!) 148 Marland Kitchen(!)  120  Resp: 20     Temp: 99.3 F (37.4 C)  98.8 F (37.1 C)   TempSrc: Oral  Oral   SpO2: 95% 93% 95%   Weight:      Height:        Intake/Output Summary (Last 24 hours) at 01/05/2020 0857 Last data filed at 01/05/2020 0600 Gross per 24 hour  Intake 820 ml  Output 1075 ml  Net -255 ml   Filed Weights   01/03/20 0552 01/05/20 0500  Weight: 87.2 kg 84.8 kg    Examination:  General: No acute distress. Cardiovascular: irreg irregular, tachy Lungs: Clear to auscultation bilaterally Abdomen: Soft, nontender, nondistended Neurological: Alert and oriented 3. Moves all extremities 4 . Cranial nerves II through XII grossly intact. Skin: Warm and dry. No rashes or lesions. Extremities: improved LE edema  Data Reviewed: I have personally reviewed following labs and imaging studies  CBC: Recent Labs  Lab  01/02/20 2250 01/03/20 0541 01/04/20 0528 01/05/20 0505  WBC 6.6 6.6 8.8 9.6  NEUTROABS 3.8 4.1 6.9 8.4*  HGB 9.8* 9.9* 10.6* 10.5*  HCT 30.6* 30.3* 31.6* 31.4*  MCV 78.9* 77.9* 77.3* 77.3*  PLT 202 221 215 235    Basic Metabolic Panel: Recent Labs  Lab 01/02/20 2250 01/03/20 0541 01/04/20 0528 01/05/20 0505  NA 134* 135 135 135  K 4.5 4.2 4.1 4.2  CL 102 102 100 98  CO2 22 22 25 26   GLUCOSE 308* 292* 244* 338*  BUN 37* 36* 35* 48*  CREATININE 1.13* 0.96 0.96 0.92  CALCIUM 8.9 9.0 9.0 9.1  MG  --  1.9 1.7 1.8  PHOS  --   --  3.5 3.8    GFR: Estimated Creatinine Clearance: 46.2 mL/min (by C-G formula based on SCr of 0.92 mg/dL).  Liver Function Tests: Recent Labs  Lab 01/02/20 2250 01/04/20 0528 01/05/20 0505  AST 20 30 18   ALT 19 22 23   ALKPHOS 49 66 70  BILITOT 0.4 0.9 0.7  PROT 6.7 6.6 6.4*  ALBUMIN 3.0* 2.7* 2.5*    CBG: Recent Labs  Lab 01/04/20 0735 01/04/20 1159 01/04/20 1704 01/04/20 2055 01/05/20 0749  GLUCAP 216* 232* 293* 264* 322*     Recent Results (from the past 240 hour(s))  Respiratory Panel by RT PCR (Flu Bach Rocchi&B, Covid) - Nasopharyngeal Swab     Status: None   Collection Time: 01/02/20 10:52 PM   Specimen: Nasopharyngeal Swab  Result Value Ref Range Status   SARS Coronavirus 2 by RT PCR NEGATIVE NEGATIVE Final    Comment: (NOTE) SARS-CoV-2 target nucleic acids are NOT DETECTED.  The SARS-CoV-2 RNA is generally detectable in upper respiratoy specimens during the acute phase of infection. The lowest concentration of SARS-CoV-2 viral copies this assay can detect is 131 copies/mL. Sheritta Deeg negative result does not preclude SARS-Cov-2 infection and should not be used as the sole basis for treatment or other patient management decisions. Zephaniah Enyeart negative result may occur with  improper specimen collection/handling, submission of specimen other than nasopharyngeal swab, presence of viral mutation(s) within the areas targeted by this assay, and  inadequate number of viral copies (<131 copies/mL). Savoy [place] negative result must be combined with clinical observations, patient history, and epidemiological information. The expected result is Negative.  Fact Sheet for Patients:  2056  Fact Sheet for Healthcare Providers:  01/07/20  This test is no t yet approved or cleared by the 01/04/20 FDA and  has been authorized for detection and/or diagnosis of SARS-CoV-2 by  FDA under an Emergency Use Authorization (EUA). This EUA will remain  in effect (meaning this test can be used) for the duration of the COVID-19 declaration under Section 564(b)(1) of the Act, 21 U.S.C. section 360bbb-3(b)(1), unless the authorization is terminated or revoked sooner.     Influenza Randee Huston by PCR NEGATIVE NEGATIVE Final   Influenza B by PCR NEGATIVE NEGATIVE Final    Comment: (NOTE) The Xpert Xpress SARS-CoV-2/FLU/RSV assay is intended as an aid in  the diagnosis of influenza from Nasopharyngeal swab specimens and  should not be used as Mellina Benison sole basis for treatment. Nasal washings and  aspirates are unacceptable for Xpert Xpress SARS-CoV-2/FLU/RSV  testing.  Fact Sheet for Patients: https://www.moore.com/  Fact Sheet for Healthcare Providers: https://www.young.biz/  This test is not yet approved or cleared by the [place] FDA and  has been authorized for detection and/or diagnosis of SARS-CoV-2 by  FDA under an Emergency Use Authorization (EUA). This EUA will remain  in effect (meaning this test can be used) for the duration of the  Covid-19 declaration under Section 564(b)(1) of the Act, 21  U.S.C. section 360bbb-3(b)(1), unless the authorization is  terminated or revoked. Performed at [facility], [address] [place], [place] [address]   Culture, blood (routine x 2)     Status: None (Preliminary result)   Collection  Time: 01/03/20  6:27 PM   Specimen: BLOOD RIGHT WRIST  Result Value Ref Range Status   Specimen Description   Final    BLOOD RIGHT WRIST Performed at [facility], [address], [place], [place] [address]    Special Requests   Final    BOTTLES DRAWN AEROBIC AND ANAEROBIC Blood Culture adequate volume Performed at [facility], [address] [place], [place] [address]    Culture   Final    NO GROWTH 1 DAY Performed at [facility], [address], [place], [place] [address]    Report Status PENDING  Incomplete  Culture, blood (routine x 2)     Status: None (Preliminary result)   Collection Time: 01/03/20  6:42 PM   Specimen: BLOOD  Result Value Ref Range Status   Specimen Description   Final    BLOOD LEFT ANTECUBITAL Performed at [facility], [address], [place], [place] [address]    Special Requests   Final    BOTTLES DRAWN AEROBIC AND ANAEROBIC Blood Culture adequate volume Performed at [facility], [address], [place], [place] [address]    Culture   Final    NO GROWTH 1 DAY Performed at [facility], [address], [place], [place] [address]    Report Status PENDING  Incomplete         Radiology Studies: MR BRAIN WO CONTRAST  Result Date: 01/03/2020 CLINICAL DATA:  Acute stroke suspected.  Neuro deficit. EXAM: MRI HEAD WITHOUT CONTRAST TECHNIQUE: Multiplanar, multiecho pulse sequences of the brain and surrounding structures were obtained without intravenous contrast. COMPARISON:  None. FINDINGS: Brain: No acute infarction, hemorrhage, hydrocephalus, extra-axial collection or mass lesion. Generalized cortical atrophy. Mild chronic small vessel ischemia in the periventricular white matter for age. Remote lacunar infarct at the right thalamus. Small remote right cerebellar infarct. Vascular: Preserved flow voids Skull and upper cervical spine: Normal marrow signal Sinuses/Orbits:  Bilateral cataract resection IMPRESSION: 1. No acute finding, including infarct. 2. Cortical atrophy and chronic small vessel disease. Electronically Signed   By: Marja Kays  Watts M.D.   On: 01/03/2020 12:02   MR LUMBAR SPINE WO CONTRAST  Result Date: 01/03/2020 CLINICAL DATA:  Low back pain with bilateral leg weakness EXAM: MRI LUMBAR SPINE WITHOUT CONTRAST TECHNIQUE: Multiplanar, multisequence MR imaging of the lumbar spine was performed. No intravenous contrast was administered. COMPARISON:  X-ray 11/06/2018 FINDINGS: Segmentation: Suspect transitional lumbosacral anatomy with partial sacralization of the L5 segment. Grade 1 anterolisthesis is present at the L4-5 level, in keeping with numbering convention established on previous radiographs. Alignment:  5 mm grade 1 anterolisthesis L4 on L5. Vertebrae: No fracture, evidence of discitis, or bone lesion. Mild diffuse intrinsic canal narrowing on the basis of congenitally short pedicles. Conus medullaris and cauda equina: Conus extends to the T12 level. Conus and cauda equina appear within normal limits. Paraspinal and other soft tissues: Cortically based T2 hyperintense lesionswithin the left kidney, incompletely characterized, but most likely represent cysts. Moderate distention of the urinary bladder is partially visualized. Disc levels: T11-T12: Sagittal sequences only. Moderate diffuse disc bulge with mild canal stenosis, moderate left and mild-to-moderate right foraminal stenosis. T12-L1: Sagittal sequences only. Mild circumferential disc bulge without evidence of foraminal or canal stenosis. L1-L2: Mild circumferential disc bulge with bilateral facet arthropathy and ligamentum flavum buckling resulting in mild-to-moderate canal stenosis with mild bilateral foraminal stenosis. L2-L3: Diffuse disc bulge with advanced bilateral facet arthropathy and ligamentum flavum buckling resulting in severe canal stenosis with moderate to severe right foraminal  stenosis. L3-L4: Postsurgical changes to the left lamina. Mild diffuse disc bulge with bilateral facet arthropathy and ligamentum flavum buckling resulting in mild canal stenosis with moderate to severe right and mild-to-moderate left foraminal stenosis. L4-L5: Postsurgical changes to the left lamina. Disc uncovering with mild diffuse disc bulge. Bilateral facet arthropathy. No canal stenosis. Moderate bilateral foraminal stenosis, left worse than right. L5-S1: Mild posterior osteophytic spurring without foraminal or canal stenosis. IMPRESSION: 1. Multilevel degenerative changes of the lumbar spine superimposed on Jaydalyn Demattia congenitally narrow canal. Findings are most pronounced at the L2-3 level where there is severe canal stenosis and moderate-to-severe right foraminal stenosis. 2. Additional levels of mild-to-moderate canal stenosis, as above. 3. Moderate-to-severe right foraminal stenosis at L3-4. 4. Moderate bilateral foraminal stenosis at L4-5. 5. Moderate distention of the urinary bladder. Correlate for urinary retention. Electronically Signed   By: Duanne Guess D.O.   On: 01/03/2020 11:49   DG CHEST PORT 1 VIEW  Result Date: 01/05/2020 CLINICAL DATA:  Hypertension and lower extremity edema. Shortness of breath. EXAM: PORTABLE CHEST 1 VIEW COMPARISON:  January 04, 2020 FINDINGS: There is bibasilar atelectasis. There is subtle consolidation medial left base. Lungs elsewhere are clear. Heart is upper normal in size with pulmonary vascularity normal. No adenopathy. There is aortic atherosclerosis. No bone lesions. IMPRESSION: Subtle consolidation medial left base, likely developing pneumonia. There is also bibasilar atelectasis. Lungs elsewhere clear. Stable cardiac prominence. No adenopathy evident. Aortic Atherosclerosis (ICD10-I70.0). Electronically Signed   By: Bretta Bang III M.D.   On: 01/05/2020 08:47   DG CHEST PORT 1 VIEW  Result Date: 01/04/2020 CLINICAL DATA:  86 year old female with  shortness of breath EXAM: PORTABLE CHEST 1 VIEW COMPARISON:  Prior chest x-ray 01/02/2020 FINDINGS: Increasing streaky airspace opacities in the left lower lobe partially obscuring the left hemidiaphragm. Stable cardiomegaly. Atherosclerotic calcifications again noted in the transverse aorta. No pulmonary edema, large pleural effusion or pneumothorax. Minimal atelectasis versus scarring in the right lung base, unchanged. No acute osseous abnormality. IMPRESSION: 1. Increasing streaky airspace opacities in the left lower  lobe which may reflect atelectasis and/or infiltrate. 2. Stable cardiomegaly. 3. Aortic calcifications. Electronically Signed   By: Malachy Moan M.D.   On: 01/04/2020 09:15   VAS [place] LOWER EXTREMITY VENOUS (DVT)  Result Date: 01/04/2020  Lower Venous DVTStudy Indications: Pain, and Swelling. Other Indications: Recent immobility. Anticoagulation: Lovenox. Limitations: Tissue properties, patient intolerant to probe pressure and unable to reposition. Comparison Study: No prior studies. Performing Technologist: Jean Rosenthal  Examination Guidelines: Miracle Criado complete evaluation includes B-mode imaging, spectral Doppler, color Doppler, and power Doppler as needed of all accessible portions of each vessel. Bilateral testing is considered an integral part of Bristal Steffy complete examination. Limited examinations for reoccurring indications may be performed as noted. The reflux portion of the exam is performed with the patient in reverse Trendelenburg.  +---------+---------------+---------+-----------+----------+-------------------+ RIGHT    CompressibilityPhasicitySpontaneityPropertiesThrombus Aging      +---------+---------------+---------+-----------+----------+-------------------+ CFV      Full           Yes      Yes                                      +---------+---------------+---------+-----------+----------+-------------------+ SFJ      Full                                                              +---------+---------------+---------+-----------+----------+-------------------+ FV Prox  Full                                                             +---------+---------------+---------+-----------+----------+-------------------+ FV Mid   Full                                                             +---------+---------------+---------+-----------+----------+-------------------+ FV DistalFull                                                             +---------+---------------+---------+-----------+----------+-------------------+ PFV      Full                                                             +---------+---------------+---------+-----------+----------+-------------------+ POP                     Yes      Yes                                      +---------+---------------+---------+-----------+----------+-------------------+  PTV                                                   Patent by color-                                                          some segments not                                                         well visualized     +---------+---------------+---------+-----------+----------+-------------------+ PERO                                                  Patent by color-                                                          some segments not                                                         well visualized     +---------+---------------+---------+-----------+----------+-------------------+   +---------+---------------+---------+-----------+----------+--------------+ LEFT     CompressibilityPhasicitySpontaneityPropertiesThrombus Aging +---------+---------------+---------+-----------+----------+--------------+ CFV      Full           Yes      Yes                                 +---------+---------------+---------+-----------+----------+--------------+ SFJ      Full                                                         +---------+---------------+---------+-----------+----------+--------------+ FV Prox  Full                                                        +---------+---------------+---------+-----------+----------+--------------+ FV Mid   Full                                                        +---------+---------------+---------+-----------+----------+--------------+  FV Distal               Yes      Yes                                 +---------+---------------+---------+-----------+----------+--------------+ PFV      Full                                                        +---------+---------------+---------+-----------+----------+--------------+ POP                     Yes      Yes                                 +---------+---------------+---------+-----------+----------+--------------+ PTV                                                   Not visualized +---------+---------------+---------+-----------+----------+--------------+ PERO                                                  Not visualized +---------+---------------+---------+-----------+----------+--------------+     Summary: RIGHT: - There is no evidence of deep vein thrombosis in the lower extremity. However, portions of this examination were limited- see technologist comments above.  LEFT: - There is no evidence of deep vein thrombosis in the lower extremity. However, portions of this examination were limited- see technologist comments above.  - Deborrah Mabin cystic structure is found in the popliteal fossa.  *See table(s) above for measurements and observations. Electronically signed by Fabienne Bruns MD on 01/04/2020 at 6:22:05 PM.    Final         Scheduled Meds: . aspirin EC  81 mg Oral Daily  . azithromycin  500 mg Oral Daily  . brimonidine  1 drop Right Eye BID  . dorzolamide-timolol  1 drop Right Eye BID  . enoxaparin (LOVENOX) injection   40 mg Subcutaneous Q24H  . furosemide  40 mg Intravenous BID  . gabapentin  100 mg Oral Daily  . gabapentin  200 mg Oral QHS  . hydrocortisone sod succinate (SOLU-CORTEF) inj  100 mg Intravenous Q8H  . influenza vaccine adjuvanted  0.5 mL Intramuscular Tomorrow-1000  . insulin aspart  0-20 Units Subcutaneous TID WC  . insulin aspart  0-5 Units Subcutaneous QHS  . insulin detemir  12 Units Subcutaneous BID  . latanoprost  1 drop Right Eye QHS  . lisinopril  40 mg Oral Daily  . methimazole  20 mg Oral Q4H  . multivitamin with minerals  1 tablet Oral Daily  . sodium chloride flush  3 mL Intravenous Q12H   Continuous Infusions: . sodium chloride    . cefTRIAXone (ROCEPHIN)  IV 2 g (01/04/20 2232)     LOS: 2 days    Time spent: over 30 min 40 min critical care time with thyroid storm    Lacretia Nicks, MD [facility]   To contact the attending provider between 7A-7P or the covering provider during after hours 7P-7A, please log into the web site [email or web address] and access using universal [place] password for that web site. If you do not have the password, please call the hospital operator.  01/05/2020, 8:57 AM

## 2020-01-06 ENCOUNTER — Other Ambulatory Visit: Payer: Self-pay | Admitting: Internal Medicine

## 2020-01-06 DIAGNOSIS — R509 Fever, unspecified: Secondary | ICD-10-CM

## 2020-01-06 DIAGNOSIS — Z8639 Personal history of other endocrine, nutritional and metabolic disease: Secondary | ICD-10-CM

## 2020-01-06 DIAGNOSIS — E0591 Thyrotoxicosis, unspecified with thyrotoxic crisis or storm: Principal | ICD-10-CM

## 2020-01-06 DIAGNOSIS — M4800 Spinal stenosis, site unspecified: Secondary | ICD-10-CM

## 2020-01-06 LAB — GLUCOSE, CAPILLARY
Glucose-Capillary: 304 mg/dL — ABNORMAL HIGH (ref 70–99)
Glucose-Capillary: 303 mg/dL — ABNORMAL HIGH (ref 70–99)
Glucose-Capillary: 201 mg/dL — ABNORMAL HIGH (ref 70–99)
Glucose-Capillary: 139 mg/dL — ABNORMAL HIGH (ref 70–99)

## 2020-01-06 LAB — COMPREHENSIVE METABOLIC PANEL
ALT: 18 U/L (ref 0–44)
AST: 16 U/L (ref 15–41)
Albumin: 2.2 g/dL — ABNORMAL LOW (ref 3.5–5.0)
Alkaline Phosphatase: 63 U/L (ref 38–126)
Anion gap: 13 (ref 5–15)
BUN: 62 mg/dL — ABNORMAL HIGH (ref 8–23)
CO2: 24 mmol/L (ref 22–32)
Calcium: 9 mg/dL (ref 8.9–10.3)
Chloride: 97 mmol/L — ABNORMAL LOW (ref 98–111)
Creatinine, Ser: 0.97 mg/dL (ref 0.44–1.00)
GFR, Estimated: 57 mL/min — ABNORMAL LOW (ref >60)
Glucose, Bld: 299 mg/dL — ABNORMAL HIGH (ref 70–99)
Potassium: 4 mmol/L (ref 3.5–5.1)
Sodium: 134 mmol/L — ABNORMAL LOW (ref 135–145)
Total Bilirubin: 0.4 mg/dL (ref 0.3–1.2)
Total Protein: 5.8 g/dL — ABNORMAL LOW (ref 6.5–8.1)

## 2020-01-06 LAB — PHOSPHORUS: Phosphorus: 4.5 mg/dL (ref 2.5–4.6)

## 2020-01-06 LAB — CBC WITH DIFFERENTIAL/PLATELET
Abs Immature Granulocytes: 0.04 10*3/uL (ref 0.00–0.07)
Basophils Absolute: 0 10*3/uL (ref 0.0–0.1)
Basophils Relative: 0 %
Eosinophils Absolute: 0 10*3/uL (ref 0.0–0.5)
Eosinophils Relative: 0 %
HCT: 30.7 % — ABNORMAL LOW (ref 36.0–46.0)
Hemoglobin: 10.3 g/dL — ABNORMAL LOW (ref 12.0–15.0)
Immature Granulocytes: 0 %
Lymphocytes Relative: 8 %
Lymphs Abs: 0.8 10*3/uL (ref 0.7–4.0)
MCH: 25.6 pg — ABNORMAL LOW (ref 26.0–34.0)
MCHC: 33.6 g/dL (ref 30.0–36.0)
MCV: 76.2 fL — ABNORMAL LOW (ref 80.0–100.0)
Monocytes Absolute: 0.4 10*3/uL (ref 0.1–1.0)
Monocytes Relative: 4 %
Neutro Abs: 8.8 10*3/uL — ABNORMAL HIGH (ref 1.7–7.7)
Neutrophils Relative %: 88 %
Platelets: 257 10*3/uL (ref 150–400)
RBC: 4.03 MIL/uL (ref 3.87–5.11)
RDW: 15.2 % (ref 11.5–15.5)
WBC: 10 10*3/uL (ref 4.0–10.5)
nRBC: 0 % (ref 0.0–0.2)

## 2020-01-06 LAB — THYROID STIMULATING IMMUNOGLOBULIN: Thyroid Stimulating Immunoglob: 3.31 IU/L — ABNORMAL HIGH (ref 0.00–0.55)

## 2020-01-06 LAB — T3, FREE: T3, Free: 6.4 pg/mL — ABNORMAL HIGH (ref 2.0–4.4)

## 2020-01-06 LAB — MAGNESIUM: Magnesium: 2 mg/dL (ref 1.7–2.4)

## 2020-01-06 MED ORDER — PROPRANOLOL HCL 20 MG PO TABS
60.0000 mg | ORAL_TABLET | Freq: Two times a day (BID) | ORAL | Status: DC
  Administered 2020-01-06 – 2020-01-07 (×4): 60 mg via ORAL
  Filled 2020-01-06 (×4): qty 3
  Filled 2020-01-06: qty 6

## 2020-01-06 NOTE — Assessment & Plan Note (Addendum)
-   TSH suppressed, elevated free T3/T4.   She takes women's MVI with biotin which could interfere with initial labs No palpable goiter.  No lid lag or proptosis.  She notes heat intolerance, decreased appetite. Bilateral LE edema.  She's developed atrial fibrillation with RVR on 10/26 am, this in constellation with other symptoms concerning for thyroid storm (intermittent fever, atrial fibrillation with RVR with HR> 140, HF with LE edema, mild lethargy/malaise). - Free T4 is downtrending and responding to treatment; needs repeat outpatient - d/c hydrocortisone - continue iodine until discharge then stop - decreased methimazole to 40 mg daily. Change to methimazole 20 mg daily at discharge and follow-up with endocrinology -Heart rate also continues to come down.  Decrease propranolol to 20 mg twice daily -Thyroid ultrasound shows heterogenous thyroid with no discrete nodules

## 2020-01-06 NOTE — Progress Notes (Signed)
Physical Therapy Treatment Patient Details Name: Rebecca [name] MRN: [id number] DOB: 1932-09-22 Today's Date: 01/06/2020    History of Present Illness Ms. Ehrich is an 86 yo female with PMH IDDM, HTN, CKD3a, HLD, GERD, CAD, chronic LE edema (PRN hctz at home) who presented to the ER from home due to SOB and worsening swelling in her legs and difficulty ambulating.MRI lumbar spine notable for multilevel degenerative changes of lumbar spine superimposed on a congenitally narrow canal ( Neurosurgery c/s - pt not interested in additional imaging or surgery at this time) and MRI of brain: no acute findings.    PT Comments    Mod assist for bed mobility, mod assist for sit to stand from elevated bed. Total assist for pivot to recliner using Stedy lift equipment. Instructed pt in BLE strengthening exercises to be done independently to minimize deconditioning. SNF recommended.    Follow Up Recommendations  SNF;Supervision/Assistance - 24 hour     Equipment Recommendations  None recommended by PT    Recommendations for Other Services       Precautions / Restrictions Precautions Precautions: Fall Restrictions Weight Bearing Restrictions: No    Mobility  Bed Mobility Overal bed mobility: Needs Assistance Bed Mobility: Supine to Sit     Supine to sit: Mod assist;HOB elevated     General bed mobility comments: increased time, mod A to scoot hips to edge of bed and to advance BLEs, used rail  Transfers Overall transfer level: Needs assistance   Transfers: Sit to/from [employer] Transfers Sit to Stand: Mod assist;From elevated surface Stand pivot transfers: Total assist       General transfer comment: mod A to power up, pt stands with trunk flexed and was unable to come to full upright position with verbal/manual cues. SPT with stedy.  Ambulation/Gait                 Stairs             Wheelchair Mobility    Modified Rankin (Stroke Patients  Only)       Balance Overall balance assessment: Needs assistance Sitting-balance support: Feet supported;Bilateral upper extremity supported;No upper extremity supported Sitting balance-Leahy Scale: Fair Sitting balance - Comments: pt with posterior lean attempting to raise Lt LE to don shoe.   Standing balance support: Bilateral upper extremity supported Standing balance-Leahy Scale: Poor Standing balance comment: reliant on external support                            Cognition Arousal/Alertness: Awake/alert Behavior During Therapy: WFL for tasks assessed/performed Overall Cognitive Status: Within Functional Limits for tasks assessed                                 General Comments: pt overall WFL's for conversations, A&Ox 4. She had tendency to want to slow down, "wait a minute"      Exercises General Exercises - Lower Extremity Ankle Circles/Pumps: AROM;Both;10 reps;Supine Quad Sets: AROM;Both;5 reps;Supine Gluteal Sets: AROM;Both;5 reps;Supine    General Comments        Pertinent Vitals/Pain Pain Assessment: No/denies pain    Home Living                      Prior Function            PT Goals (current goals can now be found in the  care plan section) Acute Rehab PT Goals Patient Stated Goal: regain strength and independence, be able to do housework PT Goal Formulation: With patient/family Time For Goal Achievement: 01/18/20 Potential to Achieve Goals: Good Progress towards PT goals: Progressing toward goals    Frequency    Min 3X/week      PT Plan Current plan remains appropriate    Co-evaluation PT/OT/SLP Co-Evaluation/Treatment: Yes            AM-PAC PT "6 Clicks" Mobility   Outcome Measure  Help needed turning from your back to your side while in a flat bed without using bedrails?: A Lot Help needed moving from lying on your back to sitting on the side of a flat bed without using bedrails?: A Lot Help  needed moving to and from a bed to a chair (including a wheelchair)?: Total Help needed standing up from a chair using your arms (e.g., wheelchair or bedside chair)?: A Lot Help needed to walk in hospital room?: Total Help needed climbing 3-5 steps with a railing? : Total 6 Click Score: 9    End of Session Equipment Utilized During Treatment: Gait belt Activity Tolerance: Patient tolerated treatment well Patient left: in chair;with call bell/phone within reach;with chair alarm set;with family/visitor present Nurse Communication: Mobility status;Need for lift equipment PT Visit Diagnosis: Muscle weakness (generalized) (M62.81);Difficulty in walking, not elsewhere classified (R26.2)     Time: 1638-4536 PT Time Calculation (min) (ACUTE ONLY): 30 min  Charges:  $Therapeutic Activity: 23-37 mins                     Ralene Bathe Kistler PT 01/06/2020  Acute Rehabilitation Services Pager [phone] Office [phone]

## 2020-01-06 NOTE — Assessment & Plan Note (Addendum)
-   may likely be related to thyroid storm however CXR on 10/26 notes subtle medial left base infiltrate concerning for possible PNA - reasonable to finish course of abx; continue Rocephin and Azithro.  Planning to treat for 5 days and stop - blood cx negative to date - UCx growing pansensitive E. coli.  Treatment for pneumonia will also cover

## 2020-01-06 NOTE — Progress Notes (Signed)
PROGRESS NOTE    Rebecca [name]   VFI:[id number]  DOB: 1932-11-23  DOA: 01/02/2020     3  PCP: Pincus Sanes, MD  CC: SOB, swelling in legs  Hospital Course: [name] is an 86 yo female with PMH IDDM, HTN, CKD3a, HLD, GERD, CAD, chronic LE edema (PRN hctz at home) who presented to the ER from home due to SOB and worsening swelling in her legs. She also has had some varying glucose levels it appears and glucose level in the ER noted to be (261, 308).  She underwent workup with CXR and was found to have signs of volume overload with some pulmonary edema noted.  Her trop was initially 47 and on repeat was the same, 47.  BNP elevated 332.  EKG shows sinus rhythm with PVCs, no obvious ischemic changes.  No prior echo in epic, but patient states she was followed historically by Dr. Riley Kill and then Dr. Shirlee Latch but she cannot remember her last echo.   She is a slightly poor historian. Her son accompanies her in the ER but cannot provide much collateral information. Typically the patient lives alone she says and uses a cane for assistance with ambulation but over the past several days has not been able to ambulate out of bed and her son had to carry her to the bathroom she says. When asking her why she cannot ambulate she states that her swelling is preventing her from "feeling her feet" which is making her balance off.  She was treated with IV lasix 20 mg x 1 in the ER. An in and out cath was also performed. She is admitted for further diuresis and work-up of CHF.  Ultimately her thyroid studies returned grossly abnormal with suppressed TSH and elevated T4/T3. Along with her presenting symptoms she was considered to have thyroid storm and started on treatment.    Interval History:  No events overnight. Daughter in law is bedside this am. Update given and questions answered. Patient is improving slowly since admission. She is regaining some strength back in her legs she says.   Old  records reviewed in assessment of this patient  ROS: Constitutional: negative for chills and fevers, Respiratory: negative for cough, Cardiovascular: negative for chest pain and Gastrointestinal: negative  Assessment & Plan: * Thyroid storm - TSH suppressed, elevated free T3/T4.   She takes women's MVI with biotin which could interfere with initial labs No palpable goiter.  No lid lag or proptosis.  She notes heat intolerance, decreased appetite. Bilateral LE edema.  She's developed atrial fibrillation with RVR on 10/26 am, this in constellation with other symptoms concerning for thyroid storm (intermittent fever, atrial fibrillation with RVR with HR> 140, HF with LE edema, mild lethargy/malaise).  - continue Methimazole, hydrocortisone, iodine - Follow free T4 q3 days - Follow TSI.  SOB (shortness of breath)-resolved as of 01/06/2020 - given worsening LE edema, elevated BNP, pulmonary edema, and indeterminate trop concern is for CHF (no prior diagnosis nor workup)  - EF 60-65%, Grade II DD - continue IV lasix - strict I&O - trended troponin; lingered in mid 40s   CHF (congestive heart failure) (HCC) - see SOB workup - follow up echo: EF 60-65%, GrII DD  CAD (coronary artery disease) - last seen by Dr. Shirlee Latch 11/01/14; she has history of PCI with 2 stents in the 1990s - calcifications noted on cxr; recent PAD workup with [facility] on 08/24/19 as well (heavily calcified LE but not felt to have  PAD in LE), however still at risk for progressive CAD (see SOB workup as well) - continue asa, statin, ACEi  Spinal stenosis Previously able to ambulate and get around with walker, but for about the past week, unable to ambulate or turn herself in bed She describes decreased sensation to bilateral inner thighs? (L more prominent, inconsistent) - no bowel/bladder incontinence Bladder scan q6 (retaining on hospital day 1, voided without need for foley) ? If weakness changes related to edema and HF and  hyperthyroidism, but MRI lumbar spine/brain obtained given concerning symptoms MRI brain/lumbar spine -> notable for multilevel degenerative changes of lumbar spine superimposed on a congenitally narrow canal.  Findings most pronounced at L2-3 level where there is severe canal stenosis and moderate to severe R foraminal stenosis (see report).  MRI brain without acute finding. Neurosurgery c/s - pt not interested in additional imaging or surgery at this time, will follow with neurosurgery  Follow sx with treatment of above PT/OT  Fever - may likely be related to thyroid storm however CXR on 10/26 notes subtle medial left base infiltrate concerning for possible PNA - reasonable to finish course of abx; continue Rocephin and Azithro  - blood cx negative to date - UCx growing GNR, continue to follow to speciation and sens  Chronic edema - B/L LE edema; seen recently by [facility] in June 2021 also - recommended for compression stockings as well; she has not purchased these yet but does want to wear them - edema seems to have worsened, see above workup  -She is also on amlodipine at home. Given chronic lower extremity edema this is on hold and would try not to resume at discharge and instead initiate different antihypertensive agent if still needed on top of ACEi  Microcytic anemia - noted [place] trait on patient history review - baseline Hgb ~11 g/dL - check iron labs (borderline low) and B12 (adequate)  Physical deconditioning - patient grossly weak in bed, unable to sit up without much assistance; also not ambulating anymore recently at home (may improve some with CHF workup/tx) - see spinal stenosis and thyroid storm   Chronic kidney disease, stage 3a (HCC) - baseline creat ~1.1 - 1.2 - currently at baseline - avoid nephrotoxic agents as able   Diabetes (HCC) - last A1c 7.4% on 06/01/19 - repeat A1c now = 6.5% on 01/03/20 - continue on SSI and CBGs for now - hold home NPH - continue Levemir,  will increase as needed in setting of hyperglycemia from steroid use  Essential hypertension - hold amlodipine due to LE edema (see chronic edema) - continue ACEi - will add further agents if needed - holding imdur as well for now    Antimicrobials: Rocephin 10/25>> present  Azithro 10/25>>present  DVT prophylaxis: Lovenox Code Status: Full Family Communication: DIL bedside Disposition Plan: Status is: Inpatient  Remains inpatient appropriate because:Unsafe d/c plan, IV treatments appropriate due to intensity of illness or inability to take PO and Inpatient level of care appropriate due to severity of illness   Dispo: The patient is from: Home              Anticipated d/c is to: SNF              Anticipated d/c date is: > 3 days              Patient currently is not medically stable to d/c.  Objective: Blood pressure (!) 108/57, pulse 69, temperature 97.9 F (36.6 C), temperature source  Oral, resp. rate 18, height 5\' 3"  (1.6 m), weight 84.2 kg, SpO2 98 %.  Examination: General appearance: pleasant elderly woman sitting up in chair bedside, no distress Head: Normocephalic, without obvious abnormality, atraumatic Eyes: EOMI Lungs: clear to auscultation bilaterally Heart: regular rate and rhythm and S1, S2 normal Abdomen: normal findings: bowel sounds normal and soft, non-tender Extremities: improved LE edema Skin: mobility and turgor normal Neurologic: symmetric weakness in LE but improved  Consultants:   NSG  Procedures:   none  Data Reviewed: I have personally reviewed following labs and imaging studies Results for orders placed or performed during the hospital encounter of 01/02/20 (from the past 24 hour(s))  Glucose, capillary     Status: Abnormal   Collection Time: 01/05/20  5:10 PM  Result Value Ref Range   Glucose-Capillary 227 (H) 70 - 99 mg/dL  CBC with Differential/Platelet     Status: Abnormal   Collection Time: 01/06/20  4:43 AM  Result Value Ref  Range   WBC 10.0 4.0 - 10.5 K/uL   RBC 4.03 3.87 - 5.11 MIL/uL   Hemoglobin 10.3 (L) 12.0 - 15.0 g/dL   HCT 01/08/20 (L) 36 - 46 %   MCV 76.2 (L) 80.0 - 100.0 fL   MCH 25.6 (L) 26.0 - 34.0 pg   MCHC 33.6 30.0 - 36.0 g/dL   RDW 96.2 22.9 - 79.8 %   Platelets 257 150 - 400 K/uL   nRBC 0.0 0.0 - 0.2 %   Neutrophils Relative % 88 %   Neutro Abs 8.8 (H) 1.7 - 7.7 K/uL   Lymphocytes Relative 8 %   Lymphs Abs 0.8 0.7 - 4.0 K/uL   Monocytes Relative 4 %   Monocytes Absolute 0.4 0.1 - 1.0 K/uL   Eosinophils Relative 0 %   Eosinophils Absolute 0.0 0.0 - 0.5 K/uL   Basophils Relative 0 %   Basophils Absolute 0.0 0.0 - 0.1 K/uL   Immature Granulocytes 0 %   Abs Immature Granulocytes 0.04 0.00 - 0.07 K/uL  Comprehensive metabolic panel     Status: Abnormal   Collection Time: 01/06/20  4:43 AM  Result Value Ref Range   Sodium 134 (L) 135 - 145 mmol/L   Potassium 4.0 3.5 - 5.1 mmol/L   Chloride 97 (L) 98 - 111 mmol/L   CO2 24 22 - 32 mmol/L   Glucose, Bld 299 (H) 70 - 99 mg/dL   BUN 62 (H) 8 - 23 mg/dL   Creatinine, Ser 01/08/20 0.44 - 1.00 mg/dL   Calcium 9.0 8.9 - 1.94 mg/dL   Total Protein 5.8 (L) 6.5 - 8.1 g/dL   Albumin 2.2 (L) 3.5 - 5.0 g/dL   AST 16 15 - 41 U/L   ALT 18 0 - 44 U/L   Alkaline Phosphatase 63 38 - 126 U/L   Total Bilirubin 0.4 0.3 - 1.2 mg/dL   GFR, Estimated 57 (L) >60 mL/min   Anion gap 13 5 - 15  Magnesium     Status: None   Collection Time: 01/06/20  4:43 AM  Result Value Ref Range   Magnesium 2.0 1.7 - 2.4 mg/dL  Phosphorus     Status: None   Collection Time: 01/06/20  4:43 AM  Result Value Ref Range   Phosphorus 4.5 2.5 - 4.6 mg/dL  Glucose, capillary     Status: Abnormal   Collection Time: 01/06/20  7:52 AM  Result Value Ref Range   Glucose-Capillary 304 (H) 70 - 99 mg/dL  Glucose,  capillary     Status: Abnormal   Collection Time: 01/06/20 11:38 AM  Result Value Ref Range   Glucose-Capillary 303 (H) 70 - 99 mg/dL    Recent Results (from the past 240  hour(s))  Respiratory Panel by RT PCR (Flu A&B, Covid) - Nasopharyngeal Swab     Status: None   Collection Time: 01/02/20 10:52 PM   Specimen: Nasopharyngeal Swab  Result Value Ref Range Status   SARS Coronavirus 2 by RT PCR NEGATIVE NEGATIVE Final    Comment: (NOTE) SARS-CoV-2 target nucleic acids are NOT DETECTED.  The SARS-CoV-2 RNA is generally detectable in upper respiratoy specimens during the acute phase of infection. The lowest concentration of SARS-CoV-2 viral copies this assay can detect is 131 copies/mL. A negative result does not preclude SARS-Cov-2 infection and should not be used as the sole basis for treatment or other patient management decisions. A negative result may occur with  improper specimen collection/handling, submission of specimen other than nasopharyngeal swab, presence of viral mutation(s) within the areas targeted by this assay, and inadequate number of viral copies (<131 copies/mL). A negative result must be combined with clinical observations, patient history, and epidemiological information. The expected result is Negative.  Fact Sheet for Patients:  https://www.moore.com/  Fact Sheet for Healthcare Providers:  https://www.young.biz/  This test is no t yet approved or cleared by the [place] FDA and  has been authorized for detection and/or diagnosis of SARS-CoV-2 by FDA under an Emergency Use Authorization (EUA). This EUA will remain  in effect (meaning this test can be used) for the duration of the COVID-19 declaration under Section 564(b)(1) of the Act, 21 U.S.C. section 360bbb-3(b)(1), unless the authorization is terminated or revoked sooner.     Influenza A by PCR NEGATIVE NEGATIVE Final   Influenza B by PCR NEGATIVE NEGATIVE Final    Comment: (NOTE) The Xpert Xpress SARS-CoV-2/FLU/RSV assay is intended as an aid in  the diagnosis of influenza from Nasopharyngeal swab specimens and  should not  be used as a sole basis for treatment. Nasal washings and  aspirates are unacceptable for Xpert Xpress SARS-CoV-2/FLU/RSV  testing.  Fact Sheet for Patients: https://www.moore.com/  Fact Sheet for Healthcare Providers: https://www.young.biz/  This test is not yet approved or cleared by the [place] FDA and  has been authorized for detection and/or diagnosis of SARS-CoV-2 by  FDA under an Emergency Use Authorization (EUA). This EUA will remain  in effect (meaning this test can be used) for the duration of the  Covid-19 declaration under Section 564(b)(1) of the Act, 21  U.S.C. section 360bbb-3(b)(1), unless the authorization is  terminated or revoked. Performed at [facility], [address] [place], [place] [address]   Culture, blood (routine x 2)     Status: None (Preliminary result)   Collection Time: 01/03/20  6:27 PM   Specimen: BLOOD RIGHT WRIST  Result Value Ref Range Status   Specimen Description   Final    BLOOD RIGHT WRIST Performed at [facility], [address] [place], [place] [address]    Special Requests   Final    BOTTLES DRAWN AEROBIC AND ANAEROBIC Blood Culture adequate volume Performed at [facility], [address], [place], [place] [address]    Culture   Final    NO GROWTH 2 DAYS Performed at [facility], [address], [place], [place] [address]    Report Status PENDING  Incomplete  Culture, blood (routine x  2)     Status: None (Preliminary result)   Collection Time: 01/03/20  6:42 PM   Specimen: BLOOD  Result Value Ref Range Status   Specimen Description   Final    BLOOD LEFT ANTECUBITAL Performed at [facility], [address], [place], [place] [address]    Special Requests   Final    BOTTLES DRAWN AEROBIC AND ANAEROBIC Blood Culture adequate volume Performed at [facility], [address], [place],  [place] [address]    Culture   Final    NO GROWTH 2 DAYS Performed at [facility], [address] [place], [place] [address]    Report Status PENDING  Incomplete  Culture, Urine     Status: Abnormal (Preliminary result)   Collection Time: 01/04/20  8:02 AM   Specimen: Urine, Random  Result Value Ref Range Status   Specimen Description   Final    URINE, RANDOM Performed at [facility], [address], [place], [place] [address]    Special Requests   Final    NONE Performed at [facility], [address], [place], [place] [address]    Culture (A)  Final    >=100,000 COLONIES/mL GRAM NEGATIVE RODS SUSCEPTIBILITIES TO FOLLOW Performed at [facility], [address], [place], [place] [address]    Report Status PENDING  Incomplete     Radiology Studies: DG CHEST PORT 1 VIEW  Result Date: 01/05/2020 CLINICAL DATA:  Hypertension and lower extremity edema. Shortness of breath. EXAM: PORTABLE CHEST 1 VIEW COMPARISON:  January 04, 2020 FINDINGS: There is bibasilar atelectasis. There is subtle consolidation medial left base. Lungs elsewhere are clear. Heart is upper normal in size with pulmonary vascularity normal. No adenopathy. There is aortic atherosclerosis. No bone lesions. IMPRESSION: Subtle consolidation medial left base, likely developing pneumonia. There is also bibasilar atelectasis. Lungs elsewhere clear. Stable cardiac prominence. No adenopathy evident. Aortic Atherosclerosis (ICD10-I70.0). Electronically Signed   By: Bretta Bang III M.D.   On: 01/05/2020 08:47   DG CHEST PORT 1 VIEW  Final Result    VAS [place] LOWER EXTREMITY VENOUS (DVT)  Final Result    DG CHEST PORT 1 VIEW  Final Result    MR LUMBAR SPINE WO CONTRAST  Final Result    MR BRAIN WO CONTRAST  Final Result    DG Chest Port 1 View  Final Result      Scheduled Meds: . aspirin EC  81 mg Oral Daily  . azithromycin  500 mg Oral Daily  . brimonidine  1 drop Right Eye  BID  . dorzolamide-timolol  1 drop Right Eye BID  . enoxaparin (LOVENOX) injection  80 mg Subcutaneous Q12H  . furosemide  40 mg Intravenous BID  . gabapentin  100 mg Oral Daily  . gabapentin  200 mg Oral QHS  . hydrocortisone sod succinate (SOLU-CORTEF) inj  100 mg Intravenous Q8H  . influenza vaccine adjuvanted  0.5 mL Intramuscular Tomorrow-1000  . insulin aspart  0-20 Units Subcutaneous TID WC  . insulin aspart  0-5 Units Subcutaneous QHS  . insulin aspart  4 Units Subcutaneous TID WC  . insulin detemir  12 Units Subcutaneous BID  . Iodine Strong (Lugols)  0.2 mL Oral Q6H  . latanoprost  1 drop Right Eye QHS  . lisinopril  40 mg Oral Daily  . methimazole  20 mg Oral Q4H  . multivitamin with minerals  1 tablet Oral Daily  .  pantoprazole  40 mg Oral Daily  . polyethylene glycol  17 g Oral Daily  . propranolol  60 mg Oral BID  . sodium chloride flush  3 mL Intravenous Q12H   PRN Meds: sodium chloride, acetaminophen, ondansetron (ZOFRAN) IV, sodium chloride flush Continuous Infusions: . sodium chloride    . cefTRIAXone (ROCEPHIN)  IV 2 g (01/05/20 1815)     LOS: 3 days  Time spent: Greater than 50% of the 35 minute visit was spent in counseling/coordination of care for the patient as laid out in the A&P.   Lewie Chamberavid Jame Seelig, MD [facility] 01/06/2020, 4:00 PM

## 2020-01-06 NOTE — Assessment & Plan Note (Signed)
Previously able to ambulate and get around with walker, but for about the past week, unable to ambulate or turn herself in bed She describes decreased sensation to bilateral inner thighs? (L more prominent, inconsistent) - no bowel/bladder incontinence Bladder scan q6 (retaining on hospital day 1, voided without need for foley) ? If weakness changes related to edema and HF and hyperthyroidism, but MRI lumbar spine/brain obtained given concerning symptoms MRI brain/lumbar spine -> notable for multilevel degenerative changes of lumbar spine superimposed on a congenitally narrow canal.  Findings most pronounced at L2-3 level where there is severe canal stenosis and moderate to severe R foraminal stenosis (see report).  MRI brain without acute finding. Neurosurgery c/s - pt not interested in additional imaging or surgery at this time, will follow with neurosurgery  Follow sx with treatment of above PT/OT

## 2020-01-06 NOTE — Progress Notes (Signed)
Inpatient Diabetes Program Recommendations  AACE/ADA: New Consensus Statement on Inpatient Glycemic Control (2015)  Target Ranges:  Prepandial:   less than 140 mg/dL      Peak postprandial:   less than 180 mg/dL (1-2 hours)      Critically ill patients:  140 - 180 mg/dL   Lab Results  Component Value Date   GLUCAP 304 (H) 01/06/2020   HGBA1C 6.5 (H) 01/03/2020    Review of Glycemic Control Results for Rebecca [name], Rebecca [name] (MRN [id number]) as of 01/06/2020 11:36  Ref. Range 01/04/2020 20:55 01/05/2020 07:49 01/05/2020 11:19 01/05/2020 17:10 01/06/2020 07:52  Glucose-Capillary Latest Ref Range: 70 - 99 mg/dL 878 (H) 676 (H) 720 (H) 227 (H) 304 (H)   Diabetes history: DM2 Outpatient Diabetes medications: 70/30 Insulin 15 units AM/ 7-10 units PM  Current orders for Inpatient glycemic control: Levemir 12 units bid + Novolog 4 units tid meal coverage if eats 50% + Novolog resistant correction tid + hs 0-5 units  Inpatient Diabetes Program Recommendations:   While on steroids: -Increase Levemir to 15 units bid -Increase Novolog meal coverage to 6 units tid if eats 50% Secure chat sent to Dr. Frederick Peers.  Thank you, Billy Fischer. Jashira Cotugno, RN, MSN, CDE  Diabetes Coordinator Inpatient Glycemic Control Team Team Pager [phone] (8am-5pm) 01/06/2020 11:37 AM

## 2020-01-06 NOTE — Care Management Important Message (Signed)
Important Message  Patient Details IM Letter given to the Patient Name: Rebecca [name] MRN: [id number] Date of Birth: 12/13/1932   Medicare Important Message Given:  Yes     Caren Macadam 01/06/2020, 11:15 AM

## 2020-01-07 DIAGNOSIS — E0591 Thyrotoxicosis, unspecified with thyrotoxic crisis or storm: Secondary | ICD-10-CM | POA: Diagnosis not present

## 2020-01-07 DIAGNOSIS — I4891 Unspecified atrial fibrillation: Secondary | ICD-10-CM

## 2020-01-07 LAB — BASIC METABOLIC PANEL
Anion gap: 13 (ref 5–15)
BUN: 85 mg/dL — ABNORMAL HIGH (ref 8–23)
CO2: 25 mmol/L (ref 22–32)
Calcium: 8.9 mg/dL (ref 8.9–10.3)
Chloride: 97 mmol/L — ABNORMAL LOW (ref 98–111)
Creatinine, Ser: 1.26 mg/dL — ABNORMAL HIGH (ref 0.44–1.00)
GFR, Estimated: 42 mL/min — ABNORMAL LOW (ref >60)
Glucose, Bld: 231 mg/dL — ABNORMAL HIGH (ref 70–99)
Potassium: 3.6 mmol/L (ref 3.5–5.1)
Sodium: 135 mmol/L (ref 135–145)

## 2020-01-07 LAB — CBC WITH DIFFERENTIAL/PLATELET
Abs Immature Granulocytes: 0.05 10*3/uL (ref 0.00–0.07)
Basophils Absolute: 0 10*3/uL (ref 0.0–0.1)
Basophils Relative: 0 %
Eosinophils Absolute: 0 10*3/uL (ref 0.0–0.5)
Eosinophils Relative: 0 %
HCT: 30.7 % — ABNORMAL LOW (ref 36.0–46.0)
Hemoglobin: 10.5 g/dL — ABNORMAL LOW (ref 12.0–15.0)
Immature Granulocytes: 1 %
Lymphocytes Relative: 10 %
Lymphs Abs: 1 10*3/uL (ref 0.7–4.0)
MCH: 25.9 pg — ABNORMAL LOW (ref 26.0–34.0)
MCHC: 34.2 g/dL (ref 30.0–36.0)
MCV: 75.6 fL — ABNORMAL LOW (ref 80.0–100.0)
Monocytes Absolute: 0.5 10*3/uL (ref 0.1–1.0)
Monocytes Relative: 5 %
Neutro Abs: 8.3 10*3/uL — ABNORMAL HIGH (ref 1.7–7.7)
Neutrophils Relative %: 84 %
Platelets: 295 10*3/uL (ref 150–400)
RBC: 4.06 MIL/uL (ref 3.87–5.11)
RDW: 15.1 % (ref 11.5–15.5)
WBC: 9.8 10*3/uL (ref 4.0–10.5)
nRBC: 0 % (ref 0.0–0.2)

## 2020-01-07 LAB — MAGNESIUM: Magnesium: 2.2 mg/dL (ref 1.7–2.4)

## 2020-01-07 LAB — URINE CULTURE: Culture: >=100000 — AB

## 2020-01-07 LAB — GLUCOSE, CAPILLARY
Glucose-Capillary: 302 mg/dL — ABNORMAL HIGH (ref 70–99)
Glucose-Capillary: 273 mg/dL — ABNORMAL HIGH (ref 70–99)
Glucose-Capillary: 156 mg/dL — ABNORMAL HIGH (ref 70–99)
Glucose-Capillary: 121 mg/dL — ABNORMAL HIGH (ref 70–99)

## 2020-01-07 MED ORDER — ENOXAPARIN SODIUM 40 MG/0.4ML ~~LOC~~ SOLN
40.0000 mg | Freq: Every day | SUBCUTANEOUS | Status: DC
  Administered 2020-01-08 – 2020-01-11 (×4): 40 mg via SUBCUTANEOUS
  Filled 2020-01-07 (×4): qty 0.4

## 2020-01-07 MED ORDER — INSULIN DETEMIR 100 UNIT/ML ~~LOC~~ SOLN
15.0000 [IU] | Freq: Two times a day (BID) | SUBCUTANEOUS | Status: DC
  Administered 2020-01-07 – 2020-01-08 (×3): 15 [IU] via SUBCUTANEOUS
  Filled 2020-01-07 (×3): qty 0.15

## 2020-01-07 MED ORDER — INSULIN ASPART 100 UNIT/ML ~~LOC~~ SOLN
6.0000 [IU] | Freq: Three times a day (TID) | SUBCUTANEOUS | Status: DC
  Administered 2020-01-07 – 2020-01-08 (×4): 6 [IU] via SUBCUTANEOUS

## 2020-01-07 MED ORDER — SODIUM CHLORIDE 0.9 % IV SOLN: INTRAVENOUS | Status: AC

## 2020-01-07 NOTE — Progress Notes (Signed)
OT Cancellation Note  Patient Details Name: Rebecca [name] MRN: [id number] DOB: 1932/09/22   Cancelled Treatment:    Reason Eval/Treat Not Completed: Patient declined, sleepy. Upon arrival patient sleeping, able to arouse but states she wants to nap "I'm going to get up for supper later." Will re-attempt 10/29 as schedule permits.  Marlyce Huge OT OT pager: [phone]   Carmelia Roller 01/07/2020, 2:57 PM

## 2020-01-07 NOTE — TOC Progression Note (Signed)
Transition of Care [facility]) - Progression Note    Patient Details  Name: ERIKAH THUMM MRN: [id number] Date of Birth: 09-Jun-1932  [facility] CM/SW Contact  Lil Lepage, Olegario Messier, RN Phone Number: 01/07/2020, 1:40 PM  Clinical Narrative:Dtr in law Tania Ade chose [facility] rep aware & following.Will start auth in am d/c date Monday per attending.Will need covid within 24 hrs of d/c.       Expected Discharge Plan: Skilled Nursing Facility Barriers to Discharge: Continued Medical Work up  Expected Discharge Plan and Services Expected Discharge Plan: Skilled Nursing Facility   Discharge Planning Services: CM Consult   Living arrangements for the past 2 months: Single Family Home                                       Social Determinants of Health (SDOH) Interventions    Readmission Risk Interventions No flowsheet data found.

## 2020-01-07 NOTE — Assessment & Plan Note (Addendum)
-  No prior known history of A. fib.  This was considered provoked (and paroxysmal) in setting of thyroid storm and she has since converted back to NSR early morning of 01/06/2020 -Will arrange for ZIO Patch at discharge for 2 weeks; will be mailed to patient and need to be placed on at rehab -Discussed de-escalation of treatment dose anticoagulation with patient and daughter-in-law at bedside.  For now will de-escalate to Lovenox 40 mg daily and no anticoagulation at discharge unless patient converts back to A. fib while on Zio patch - adjusting BB as needed (per thyroid storm)

## 2020-01-07 NOTE — Progress Notes (Signed)
PROGRESS NOTE    Rebecca [name]   JEH:[id number]  DOB: 10/11/1932  DOA: 01/02/2020     4  PCP: Pincus Sanes, MD  CC: SOB, swelling in legs  Hospital Course: Ms. Spade is an 86 yo female with PMH IDDM, HTN, CKD3a, HLD, GERD, CAD, chronic LE edema (PRN hctz at home) who presented to the ER from home due to SOB and worsening swelling in her legs. She also has had some varying glucose levels it appears and glucose level in the ER noted to be (261, 308).  She underwent workup with CXR and was found to have signs of volume overload with some pulmonary edema noted.  Her trop was initially 47 and on repeat was the same, 47.  BNP elevated 332.  EKG shows sinus rhythm with PVCs, no obvious ischemic changes.  No prior echo in epic, but patient states she was followed historically by Dr. Riley Kill and then Dr. Shirlee Latch but she cannot remember her last echo.   She is a slightly poor historian. Her son accompanies her in the ER but cannot provide much collateral information. Typically the patient lives alone she says and uses a cane for assistance with ambulation but over the past several days has not been able to ambulate out of bed and her son had to carry her to the bathroom she says. When asking her why she cannot ambulate she states that her swelling is preventing her from "feeling her feet" which is making her balance off.  She was treated with IV lasix 20 mg x 1 in the ER. An in and out cath was also performed. She is admitted for further diuresis and work-up of CHF.  Ultimately her thyroid studies returned grossly abnormal with suppressed TSH and elevated T4/T3. Along with her presenting symptoms she was considered to have thyroid storm and started on treatment.    Interval History:  No events overnight. Daughter in law is bedside this am. Further update given.  We discussed discontinuation of treatment dose Lovenox since she has now converted to sinus rhythm out of A. fib.  We will  continue monitoring with telemetry and a Zio patch at discharge.  If she does have further recurrence of A. fib, anticoagulation can be reconsidered again at that time.  Patient and family bedside were in understanding and agreement.  Old records reviewed in assessment of this patient  ROS: Constitutional: negative for chills and fevers, Respiratory: negative for cough, Cardiovascular: negative for chest pain and Gastrointestinal: negative  Assessment & Plan: * Thyroid storm - TSH suppressed, elevated free T3/T4.   She takes women's MVI with biotin which could interfere with initial labs No palpable goiter.  No lid lag or proptosis.  She notes heat intolerance, decreased appetite. Bilateral LE edema.  She's developed atrial fibrillation with RVR on 10/26 am, this in constellation with other symptoms concerning for thyroid storm (intermittent fever, atrial fibrillation with RVR with HR> 140, HF with LE edema, mild lethargy/malaise).  - continue Methimazole, hydrocortisone, iodine - Follow free T4 q3 days - Follow TSI.  SOB (shortness of breath)-resolved as of 01/06/2020 - given worsening LE edema, elevated BNP, pulmonary edema, and indeterminate trop concern is for CHF (no prior diagnosis nor workup)  - EF 60-65%, Grade II DD - strict I&O - trended troponin; lingered in mid 40s   CHF (congestive heart failure) (HCC) - see SOB workup - follow up echo: EF 60-65%, GrII DD - hold lasix for now; repeat BMP in  am; LE edema has essentially resolved - BMP suggests overdiuresis; give NS@75  x 12 hours  CAD (coronary artery disease) - last seen by Dr. Shirlee Latch 11/01/14; she has history of PCI with 2 stents in the 1990s - calcifications noted on cxr; recent PAD workup with [facility] on 08/24/19 as well (heavily calcified LE but not felt to have PAD in LE), however still at risk for progressive CAD (see SOB workup as well) - continue asa, statin, ACEi  Spinal stenosis Previously able to ambulate and get  around with walker, but for about the past week, unable to ambulate or turn herself in bed She describes decreased sensation to bilateral inner thighs? (L more prominent, inconsistent) - no bowel/bladder incontinence Bladder scan q6 (retaining on hospital day 1, voided without need for foley) ? If weakness changes related to edema and HF and hyperthyroidism, but MRI lumbar spine/brain obtained given concerning symptoms MRI brain/lumbar spine -> notable for multilevel degenerative changes of lumbar spine superimposed on a congenitally narrow canal.  Findings most pronounced at L2-3 level where there is severe canal stenosis and moderate to severe R foraminal stenosis (see report).  MRI brain without acute finding. Neurosurgery c/s - pt not interested in additional imaging or surgery at this time, will follow with neurosurgery  Follow sx with treatment of above PT/OT  Fever - may likely be related to thyroid storm however CXR on 10/26 notes subtle medial left base infiltrate concerning for possible PNA - reasonable to finish course of abx; continue Rocephin and Azithro.  Planning to treat for 5 days and stop - blood cx negative to date - UCx growing pansensitive E. coli.  Treatment for pneumonia will also cover  Microcytic anemia - noted Mascotte trait on patient history review - baseline Hgb ~11 g/dL - check iron labs (borderline low) and B12 (adequate)  Chronic edema-resolved as of 01/07/2020 - B/L LE edema; seen recently by [facility] in June 2021 also - recommended for compression stockings as well; she has not purchased these yet but does want to wear them - edema seems to have worsened (on admission), see above workup  -She is also on amlodipine at home. Given chronic lower extremity edema this is on hold and would try not to resume at discharge and instead initiate different antihypertensive agent if still needed on top of ACEi - now with thyroid treatment, edema has resolved  Physical  deconditioning - patient grossly weak in bed, unable to sit up without much assistance; also not ambulating anymore recently at home (may improve some with CHF workup/tx) - see spinal stenosis and thyroid storm   Chronic kidney disease, stage 3a (HCC) - baseline creat ~1.1 - 1.2 - currently at baseline on admission - some increase in BUN/Creat on IV lasix; holding lasix for now and give NS x 12 hours - avoid nephrotoxic agents as able   Diabetes (HCC) - last A1c 7.4% on 06/01/19 - repeat A1c now = 6.5% on 01/03/20 - continue on SSI and CBGs for now - hold home NPH - continue Levemir, will increase as needed in setting of hyperglycemia from steroid use  Essential hypertension - hold amlodipine due to LE edema (see chronic edema) - continue ACEi - will add further agents if needed - holding imdur as well for now  Atrial fibrillation with RVR (HCC)-resolved as of 01/07/2020 -No prior known history of A. fib.  This was considered provoked in setting of thyroid storm and she has since converted back to NSR early morning  of 01/06/2020 -Continue on telemetry -Will arrange for ZIO Patch at discharge for 2 weeks -Discussed de-escalation of treatment dose anticoagulation with patient and daughter-in-law at bedside.  For now will de-escalate to Lovenox 40 mg daily and no anticoagulation at discharge unless patient converts back to A. fib while on Zio patch - adjusting BB as needed (per thyroid storm)   Antimicrobials: Rocephin 10/25>> present  Azithro 10/25>>present  DVT prophylaxis: Lovenox Code Status: Full Family Communication: DIL bedside Disposition Plan: Status is: Inpatient  Remains inpatient appropriate because:Unsafe d/c plan, IV treatments appropriate due to intensity of illness or inability to take PO and Inpatient level of care appropriate due to severity of illness   Dispo: The patient is from: Home              Anticipated d/c is to: SNF              Anticipated d/c  date is: > 3 days. Possibly Monday               Patient currently is not medically stable to d/c.  Objective: Blood pressure (!) 101/44, pulse 72, temperature 97.6 F (36.4 C), temperature source Oral, resp. rate 18, height  (1.6 m), weight 84.2 kg, SpO2 100 %.  Examination: General appearance: pleasant elderly woman sitting up in chair bedside, no distress Head: Normocephalic, without obvious abnormality, atraumatic Eyes: EOMI Lungs: clear to auscultation bilaterally Heart: regular rate and rhythm and S1, S2 normal Abdomen: normal findings: bowel sounds normal and soft, non-tender Extremities: LE edema resolved Skin: mobility and turgor normal Neurologic: symmetric weakness in LE but improved  Consultants:   NSG  Procedures:   none  Data Reviewed: I have personally reviewed following labs and imaging studies Results for orders placed or performed during the hospital encounter of 01/02/20 (from the past 24 hour(s))  Glucose, capillary     Status: Abnormal   Collection Time: 01/06/20  4:54 PM  Result Value Ref [facility]   Glucose-Capillary 201 (H) 70 - 99 mg/dL  Glucose, capillary     Status: Abnormal   Collection Time: 01/06/20  8:49 PM  Result Value Ref [facility]   Glucose-Capillary 139 (H) 70 - 99 mg/dL  Basic metabolic panel     Status: Abnormal   Collection Time: 01/07/20  4:10 AM  Result Value Ref Range   Sodium 135 135 - 145 mmol/L   Potassium 3.6 3.5 - 5.1 mmol/L   Chloride 97 (L) 98 - 111 mmol/L   CO2 25 22 - 32 mmol/L   Glucose, Bld 231 (H) 70 - 99 mg/dL   BUN 85 (H) 8 - 23 mg/dL   Creatinine, Ser 1.61 (H) 0.44 - 1.00 mg/dL   Calcium 8.9 8.9 - 09.6 mg/dL   GFR, Estimated 42 (L) >60 mL/min   Anion gap 13 5 - 15  CBC with Differential/Platelet     Status: Abnormal   Collection Time: 01/07/20  4:10 AM  Result Value Ref Range   WBC 9.8 4.0 - 10.5 K/uL   RBC 4.06 3.87 - 5.11 MIL/uL   Hemoglobin 10.5 (L) 12.0 - 15.0 g/dL   HCT 04.5 (L) 36 - 46 %   MCV 75.6 (L)  80.0 - 100.0 fL   MCH 25.9 (L) 26.0 - 34.0 pg   MCHC 34.2 30.0 - 36.0 g/dL   RDW 40.9 81.1 - 91.4 %   Platelets 295 150 - 400 K/uL   nRBC 0.0 0.0 - 0.2 %   Neutrophils  Relative % 84 %   Neutro Abs 8.3 (H) 1.7 - 7.7 K/uL   Lymphocytes Relative 10 %   Lymphs Abs 1.0 0.7 - 4.0 K/uL   Monocytes Relative 5 %   Monocytes Absolute 0.5 0.1 - 1.0 K/uL   Eosinophils Relative 0 %   Eosinophils Absolute 0.0 0.0 - 0.5 K/uL   Basophils Relative 0 %   Basophils Absolute 0.0 0.0 - 0.1 K/uL   Immature Granulocytes 1 %   Abs Immature Granulocytes 0.05 0.00 - 0.07 K/uL  Magnesium     Status: None   Collection Time: 01/07/20  4:10 AM  Result Value Ref Range   Magnesium 2.2 1.7 - 2.4 mg/dL  Glucose, capillary     Status: Abnormal   Collection Time: 01/07/20  7:50 AM  Result Value Ref Range   Glucose-Capillary 302 (H) 70 - 99 mg/dL  Glucose, capillary     Status: Abnormal   Collection Time: 01/07/20 12:37 PM  Result Value Ref Range   Glucose-Capillary 273 (H) 70 - 99 mg/dL    Recent Results (from the past 240 hour(s))  Respiratory Panel by RT PCR (Flu A&B, Covid) - Nasopharyngeal Swab     Status: None   Collection Time: 01/02/20 10:52 PM   Specimen: Nasopharyngeal Swab  Result Value Ref [facility] Status   SARS Coronavirus 2 by RT PCR NEGATIVE NEGATIVE Final    Comment: (NOTE) SARS-CoV-2 target nucleic acids are NOT DETECTED.  The SARS-CoV-2 RNA is generally detectable in upper respiratoy specimens during the acute phase of infection. The lowest concentration of SARS-CoV-2 viral copies this assay can detect is 131 copies/mL. A negative result does not preclude SARS-Cov-2 infection and should not be used as the sole basis for treatment or other patient management decisions. A negative result may occur with  improper specimen collection/handling, submission of specimen other than nasopharyngeal swab, presence of viral mutation(s) within the areas targeted by this assay, and inadequate number of  viral copies (<131 copies/mL). A negative result must be combined with clinical observations, patient history, and epidemiological information. The expected result is Negative.  Fact Sheet for Patients:  https://www.moore.com/  Fact Sheet for Healthcare Providers:  https://www.young.biz/  This test is no t yet approved or cleared by the [place] FDA and  has been authorized for detection and/or diagnosis of SARS-CoV-2 by FDA under an Emergency Use Authorization (EUA). This EUA will remain  in effect (meaning this test can be used) for the duration of the COVID-19 declaration under Section 564(b)(1) of the Act, 21 U.S.C. section 360bbb-3(b)(1), unless the authorization is terminated or revoked sooner.     Influenza A by PCR NEGATIVE NEGATIVE Final   Influenza B by PCR NEGATIVE NEGATIVE Final    Comment: (NOTE) The Xpert Xpress SARS-CoV-2/FLU/RSV assay is intended as an aid in  the diagnosis of influenza from Nasopharyngeal swab specimens and  should not be used as a sole basis for treatment. Nasal washings and  aspirates are unacceptable for Xpert Xpress SARS-CoV-2/FLU/RSV  testing.  Fact Sheet for Patients: https://www.moore.com/  Fact Sheet for Healthcare Providers: https://www.young.biz/  This test is not yet approved or cleared by the [place] FDA and  has been authorized for detection and/or diagnosis of SARS-CoV-2 by  FDA under an Emergency Use Authorization (EUA). This EUA will remain  in effect (meaning this test can be used) for the duration of the  Covid-19 declaration under Section 564(b)(1) of the Act, 21  U.S.C. section 360bbb-3(b)(1), unless the authorization is  terminated or revoked. Performed at [facility], [address] [place], [place] [address]   Culture, blood (routine x 2)     Status: None (Preliminary result)   Collection Time: 01/03/20  6:27  PM   Specimen: BLOOD RIGHT WRIST  Result Value Ref Range Status   Specimen Description   Final    BLOOD RIGHT WRIST Performed at [facility], [address] [place], [place] [address]    Special Requests   Final    BOTTLES DRAWN AEROBIC AND ANAEROBIC Blood Culture adequate volume Performed at [facility], [address] [place], [place] [address]    Culture   Final    NO GROWTH 3 DAYS Performed at [facility], [address] [place], [place] [address]    Report Status PENDING  Incomplete  Culture, blood (routine x 2)     Status: None (Preliminary result)   Collection Time: 01/03/20  6:42 PM   Specimen: BLOOD  Result Value Ref [facility] Status   Specimen Description   Final    BLOOD LEFT ANTECUBITAL Performed at [facility], [address] [place], [place] [address]    Special Requests   Final    BOTTLES DRAWN AEROBIC AND ANAEROBIC Blood Culture adequate volume Performed at [facility], [address] [place], [place] [address]    Culture   Final    NO GROWTH 3 DAYS Performed at [facility], [address] [place], [place] [address]    Report Status PENDING  Incomplete  Culture, Urine     Status: Abnormal   Collection Time: 01/04/20  8:02 AM   Specimen: Urine, Random  Result Value Ref Range Status   Specimen Description   Final    URINE, RANDOM Performed at [facility], [address] [place], [place] [address]    Special Requests   Final    NONE Performed at [facility], [address] [place], [place] [address]    Culture >=100,000 COLONIES/mL ESCHERICHIA COLI (A)  Final   Report Status 01/07/2020 FINAL  Final   Organism ID, Bacteria ESCHERICHIA COLI (A)  Final      Susceptibility   Escherichia coli - MIC*    AMPICILLIN 4 SENSITIVE Sensitive     CEFAZOLIN <=4 SENSITIVE Sensitive     CEFTRIAXONE <=0.25 SENSITIVE Sensitive     CIPROFLOXACIN <=0.25  SENSITIVE Sensitive     GENTAMICIN <=1 SENSITIVE Sensitive     IMIPENEM 1 SENSITIVE Sensitive     NITROFURANTOIN <=16 SENSITIVE Sensitive     TRIMETH/SULFA <=20 SENSITIVE Sensitive     AMPICILLIN/SULBACTAM <=2 SENSITIVE Sensitive     PIP/TAZO <=4 SENSITIVE Sensitive     * >=100,000 COLONIES/mL ESCHERICHIA COLI     Radiology Studies: No results found. DG CHEST PORT 1 VIEW  Final Result    VAS US LOWER EXTREMITY VENOUS (DVT)  Final Result    DG CHEST PORT 1 VIEW  Final Result    MR LUMBAR SPINE WO CONTRAST  Final Result    MR BRAIN WO CONTRAST  Final Result    DG Chest Port 1 View  Final Result      Scheduled Meds: . aspirin EC  81 mg Oral Daily  . azithromycin  500 mg Oral Daily  . brimonidine  1 drop Right Eye BID  . dorzolamide-timolol  1 drop Right Eye BID  . [START ON 01/08/2020] enoxaparin (LOVENOX)  injection  40 mg Subcutaneous Daily  . gabapentin  100 mg Oral Daily  . gabapentin  200 mg Oral QHS  . hydrocortisone sod succinate (SOLU-CORTEF) inj  100 mg Intravenous Q8H  . influenza vaccine adjuvanted  0.5 mL Intramuscular Tomorrow-1000  . insulin aspart  0-20 Units Subcutaneous TID WC  . insulin aspart  0-5 Units Subcutaneous QHS  . insulin aspart  6 Units Subcutaneous TID WC  . insulin detemir  15 Units Subcutaneous BID  . Iodine Strong (Lugols)  0.2 mL Oral Q6H  . latanoprost  1 drop Right Eye QHS  . lisinopril  40 mg Oral Daily  . methimazole  20 mg Oral Q4H  . multivitamin with minerals  1 tablet Oral Daily  . pantoprazole  40 mg Oral Daily  . polyethylene glycol  17 g Oral Daily  . propranolol  60 mg Oral BID  . sodium chloride flush  3 mL Intravenous Q12H   PRN Meds: sodium chloride, acetaminophen, ondansetron (ZOFRAN) IV, sodium chloride flush Continuous Infusions: . sodium chloride    . sodium chloride    . cefTRIAXone (ROCEPHIN)  IV 2 g (01/06/20 1813)     LOS: 4 days  Time spent: Greater than 50% of the 35 minute visit was spent in  counseling/coordination of care for the patient as laid out in the A&P.   Lewie Chamber, MD [facility] 01/07/2020, 1:41 PM

## 2020-01-08 ENCOUNTER — Telehealth: Payer: Self-pay | Admitting: Internal Medicine

## 2020-01-08 ENCOUNTER — Inpatient Hospital Stay (HOSPITAL_COMMUNITY): Payer: Medicare Other

## 2020-01-08 DIAGNOSIS — E0591 Thyrotoxicosis, unspecified with thyrotoxic crisis or storm: Secondary | ICD-10-CM | POA: Diagnosis not present

## 2020-01-08 LAB — CBC WITH DIFFERENTIAL/PLATELET
Abs Immature Granulocytes: 0.08 10*3/uL — ABNORMAL HIGH (ref 0.00–0.07)
Basophils Absolute: 0 10*3/uL (ref 0.0–0.1)
Basophils Relative: 0 %
Eosinophils Absolute: 0 10*3/uL (ref 0.0–0.5)
Eosinophils Relative: 0 %
HCT: 33.1 % — ABNORMAL LOW (ref 36.0–46.0)
Hemoglobin: 11.1 g/dL — ABNORMAL LOW (ref 12.0–15.0)
Immature Granulocytes: 1 %
Lymphocytes Relative: 11 %
Lymphs Abs: 1.2 10*3/uL (ref 0.7–4.0)
MCH: 25.5 pg — ABNORMAL LOW (ref 26.0–34.0)
MCHC: 33.5 g/dL (ref 30.0–36.0)
MCV: 76.1 fL — ABNORMAL LOW (ref 80.0–100.0)
Monocytes Absolute: 0.6 10*3/uL (ref 0.1–1.0)
Monocytes Relative: 5 %
Neutro Abs: 9.1 10*3/uL — ABNORMAL HIGH (ref 1.7–7.7)
Neutrophils Relative %: 83 %
Platelets: 334 10*3/uL (ref 150–400)
RBC: 4.35 MIL/uL (ref 3.87–5.11)
RDW: 15.4 % (ref 11.5–15.5)
WBC: 10.9 10*3/uL — ABNORMAL HIGH (ref 4.0–10.5)
nRBC: 0 % (ref 0.0–0.2)

## 2020-01-08 LAB — BASIC METABOLIC PANEL
Anion gap: 11 (ref 5–15)
BUN: 79 mg/dL — ABNORMAL HIGH (ref 8–23)
CO2: 26 mmol/L (ref 22–32)
Calcium: 8.8 mg/dL — ABNORMAL LOW (ref 8.9–10.3)
Chloride: 102 mmol/L (ref 98–111)
Creatinine, Ser: 1.1 mg/dL — ABNORMAL HIGH (ref 0.44–1.00)
GFR, Estimated: 49 mL/min — ABNORMAL LOW (ref >60)
Glucose, Bld: 88 mg/dL (ref 70–99)
Potassium: 3.2 mmol/L — ABNORMAL LOW (ref 3.5–5.1)
Sodium: 139 mmol/L (ref 135–145)

## 2020-01-08 LAB — GLUCOSE, CAPILLARY
Glucose-Capillary: 89 mg/dL (ref 70–99)
Glucose-Capillary: 201 mg/dL — ABNORMAL HIGH (ref 70–99)
Glucose-Capillary: 95 mg/dL (ref 70–99)
Glucose-Capillary: 101 mg/dL — ABNORMAL HIGH (ref 70–99)

## 2020-01-08 LAB — MAGNESIUM: Magnesium: 2.3 mg/dL (ref 1.7–2.4)

## 2020-01-08 LAB — T4, FREE: Free T4: 3.69 ng/dL — ABNORMAL HIGH (ref 0.61–1.12)

## 2020-01-08 MED ORDER — PROPRANOLOL HCL 20 MG PO TABS
20.0000 mg | ORAL_TABLET | Freq: Two times a day (BID) | ORAL | Status: DC
  Administered 2020-01-08 – 2020-01-11 (×7): 20 mg via ORAL
  Filled 2020-01-08 (×6): qty 1

## 2020-01-08 MED ORDER — POTASSIUM CHLORIDE CRYS ER 20 MEQ PO TBCR
40.0000 meq | EXTENDED_RELEASE_TABLET | Freq: Once | ORAL | Status: AC
  Administered 2020-01-08: 40 meq via ORAL
  Filled 2020-01-08: qty 2

## 2020-01-08 MED ORDER — METHIMAZOLE 10 MG PO TABS
20.0000 mg | ORAL_TABLET | Freq: Four times a day (QID) | ORAL | Status: DC
  Administered 2020-01-08 – 2020-01-11 (×13): 20 mg via ORAL
  Filled 2020-01-08 (×17): qty 2

## 2020-01-08 NOTE — Telephone Encounter (Signed)
Admitted for thyroid storm, will need to see an endocrinologist  Being released and going to rehab on Monday 11.1.21  Referral started now  Does not want to see Romero Belling  Endocrinologist--Ajay Lucianne Muss preferred  Please call Tania Ade once referral is made at [phone]

## 2020-01-08 NOTE — Progress Notes (Signed)
Occupational Therapy Treatment Patient Details Name: Rebecca [name] MRN: [id number] DOB: 1932-07-24 Today's Date: 01/08/2020    History of present illness [name] is an 86 yo female with PMH IDDM, HTN, CKD3a, HLD, GERD, CAD, chronic LE edema (PRN hctz at home) who presented to the ER from home due to SOB and worsening swelling in her legs and difficulty ambulating.MRI lumbar spine notable for multilevel degenerative changes of lumbar spine superimposed on a congenitally narrow canal ( Neurosurgery c/s - pt not interested in additional imaging or surgery at this time) and MRI of brain: no acute findings.   OT comments  Upon arrival patient soiled from BM, patient require mod A for rolling with cues for sequencing and total A for perianal care. Patient max A to sit upright from side lying and total A for LB dressing. Patient require cues for hand placement and min A x2 to power up to standing in stedy. Patient practice sit <> stand from stedy 5x progressing to min A x1. Recommend continued acute OT services to facilitate D/C to venue listed below.   Follow Up Recommendations  SNF;Supervision/Assistance - 24 hour    Equipment Recommendations  Other (comment) (defer next venue)       Precautions / Restrictions Precautions Precautions: Fall       Mobility Bed Mobility Overal bed mobility: Needs Assistance Bed Mobility: Rolling;Sidelying to Sit Rolling: Mod assist Sidelying to sit: Max assist       General bed mobility comments: cues for rolling in bed for peri care, max A for trunk support to sitting  Transfers Overall transfer level: Needs assistance   Transfers: Sit to/from Stand Sit to Stand: Min assist;+2 physical assistance;+2 safety/equipment;From elevated surface         General transfer comment: min x2 to power up to standing, verbal cues for hand placement on stedy to assist. patient perform sit to stand from stedy 5x progressing to min A x1     Balance  Overall balance assessment: Needs assistance Sitting-balance support: Feet supported Sitting balance-Leahy Scale: Fair     Standing balance support: Bilateral upper extremity supported Standing balance-Leahy Scale: Poor Standing balance comment: reliant on external support                           ADL either performed or assessed with clinical judgement   ADL Overall ADL's : Needs assistance/impaired                     Lower Body Dressing: Total assistance Lower Body Dressing Details (indicate cue type and reason): to don shoes seated EOB Toilet Transfer: Minimal assistance;+2 for physical assistance;+2 for safety/equipment Toilet Transfer Details (indicate cue type and reason): stedy Toileting- Clothing Manipulation and Hygiene: Total assistance;Bed level Toileting - Clothing Manipulation Details (indicate cue type and reason): incontinent of stool in bed     Functional mobility during ADLs: Minimal assistance;+2 for physical assistance;+2 for safety/equipment (stedy)                 Cognition Arousal/Alertness: Awake/alert Behavior During Therapy: WFL for tasks assessed/performed Overall Cognitive Status: Within Functional Limits for tasks assessed  Pertinent Vitals/ Pain       Pain Assessment: Faces Faces Pain Scale: Hurts little more Pain Location: LE's with movement and touch Pain Descriptors / Indicators: Discomfort Pain Intervention(s): Monitored during session         Frequency  Min 2X/week        Progress Toward Goals  OT Goals(current goals can now be found in the care plan section)  Progress towards OT goals: Progressing toward goals  Acute Rehab OT Goals Patient Stated Goal: regain strength and independence, be able to do housework OT Goal Formulation: With patient Time For Goal Achievement: 01/18/20 Potential to Achieve Goals: Good  Plan Discharge  plan remains appropriate    Co-evaluation    PT/OT/SLP Co-Evaluation/Treatment: Yes Reason for Co-Treatment: For patient/therapist safety;To address functional/ADL transfers   OT goals addressed during session: ADL's and self-care      AM-PAC OT "6 Clicks" Daily Activity     Outcome Measure   Help from another person eating meals?: None Help from another person taking care of personal grooming?: A Little Help from another person toileting, which includes using toliet, bedpan, or urinal?: A Lot Help from another person bathing (including washing, rinsing, drying)?: A Lot Help from another person to put on and taking off regular upper body clothing?: A Lot Help from another person to put on and taking off regular lower body clothing?: Total 6 Click Score: 14    End of Session Equipment Utilized During Treatment: Gait belt;Other (comment) (stedy)  OT Visit Diagnosis: Unsteadiness on feet (R26.81);Other abnormalities of gait and mobility (R26.89);Muscle weakness (generalized) (M62.81);Pain Pain - Right/Left:  (bilateral) Pain - part of body: Leg   Activity Tolerance Patient tolerated treatment well   Patient Left in chair;with call bell/phone within reach;with chair alarm set   Nurse Communication Mobility status;Need for lift equipment        Time: 7829-5621 OT Time Calculation (min): 26 min  Charges: OT General Charges $OT Visit: 1 Visit OT Treatments $Self Care/Home Management : 8-22 mins  Marlyce Huge OT OT pager: [phone]   Carmelia Roller 01/08/2020, 3:08 PM

## 2020-01-08 NOTE — Telephone Encounter (Signed)
Referral ordered

## 2020-01-08 NOTE — Progress Notes (Signed)
PROGRESS NOTE    Rebecca [name]   UJW:[id number]  DOB: 1932-07-22  DOA: 01/02/2020     5  PCP: Rebecca SanesBurns, Stacy J, MD  CC: SOB, swelling in legs  Hospital Course: Ms. Rebecca GatherShoffner is an 86 yo female with PMH IDDM, HTN, CKD3a, HLD, GERD, CAD, chronic LE edema (PRN hctz at home) who presented to the ER from home due to SOB and worsening swelling in her legs. She also has had some varying glucose levels it appears and glucose level in the ER noted to be (261, 308).  She underwent workup with CXR and was found to have signs of volume overload with some pulmonary edema noted.  Her trop was initially 47 and on repeat was the same, 47.  BNP elevated 332.  EKG shows sinus rhythm with PVCs, no obvious ischemic changes.  No prior echo in epic, but patient states she was followed historically by Dr. Riley [name] and then Dr. Shirlee [name] but she cannot remember her last echo.   She is a slightly poor historian. Her son accompanies her in the ER but cannot provide much collateral information. Typically the patient lives alone she says and uses a cane for assistance with ambulation but over the past several days has not been able to ambulate out of bed and her son had to carry her to the bathroom she says. When asking her why she cannot ambulate she states that her swelling is preventing her from "feeling her feet" which is making her balance off.  She was treated with IV lasix 20 mg x 1 in the ER. An in and out cath was also performed. She is admitted for further diuresis and work-up of CHF.  Ultimately her thyroid studies returned grossly abnormal with suppressed TSH and elevated T4/T3. Along with her presenting symptoms she was considered to have thyroid storm and started on treatment.    Interval History:  No events overnight.  Reviewed current progress with daughter-in-law at bedside.  Patient doing well and pleasantly resting in bed.  Understands tentative plan is for discharge to SNF on Monday.  Needs  Covid swab ordered on Sunday.  Old records reviewed in assessment of this patient  ROS: Constitutional: negative for chills and fevers, Respiratory: negative for cough, Cardiovascular: negative for chest pain and Gastrointestinal: negative  Assessment & Plan: * Thyroid storm - TSH suppressed, elevated free T3/T4.   She takes women's MVI with biotin which could interfere with initial labs No palpable goiter.  No lid lag or proptosis.  She notes heat intolerance, decreased appetite. Bilateral LE edema.  She's developed atrial fibrillation with RVR on 10/26 am, this in constellation with other symptoms concerning for thyroid storm (intermittent fever, atrial fibrillation with RVR with HR> 140, HF with LE edema, mild lethargy/malaise). - Free T4 is downtrending and responding to treatment - d/c hydrocortisone - continue iodine until discharge then stop - decrease methimazole to 40 mg daily. Change to methimazole 20 mg daily at discharge and follow-up with endocrinology -Heart rate also continues to come down.  Decrease propranolol to 20 mg twice daily - Follow free T4 q3 days -Follow-up thyroid ultrasound  SOB (shortness of breath)-resolved as of 01/06/2020 - given worsening LE edema, elevated BNP, pulmonary edema, and indeterminate trop concern is for CHF (no prior diagnosis nor workup)  - EF 60-65%, Grade II DD - strict I&O - trended troponin; lingered in mid 40s   Acute on chronic diastolic CHF (congestive heart failure) (HCC) - see SOB workup -  follow up echo: EF 60-65%, GrII DD - hold lasix for now; repeat BMP in am; LE edema has essentially resolved - BMP suggested overdiuresis; give NS@75  x 12 hours.  Responded well on 01/08/2020 -Monitor off fluids and Lasix for now  Diabetes [facility]) - last A1c 7.4% on 06/01/19 - repeat A1c now = 6.5% on 01/03/20 - continue on SSI and CBGs for now - d/c prandial coverage for now and watch - hold home NPH -Now off steroids as of 01/08/2020.   Will hold Levemir and assess response and restart if needed at lower dose   CAD (coronary artery disease) - last seen by Dr. Shirlee Latch 11/01/14; she has history of PCI with 2 stents in the 1990s - calcifications noted on cxr; recent PAD workup with [facility] on 08/24/19 as well (heavily calcified LE but not felt to have PAD in LE), however still at risk for progressive CAD (see SOB workup as well) - continue asa, statin, ACEi  Atrial fibrillation with RVR (HCC)-resolved as of 01/07/2020 -No prior known history of A. fib.  This was considered provoked (and paroxysmal) in setting of thyroid storm and she has since converted back to NSR early morning of 01/06/2020 -Continue on telemetry -Will arrange for ZIO Patch at discharge for 2 weeks -Discussed de-escalation of treatment dose anticoagulation with patient and daughter-in-law at bedside.  For now will de-escalate to Lovenox 40 mg daily and no anticoagulation at discharge unless patient converts back to A. fib while on Zio patch - adjusting BB as needed (per thyroid storm)  Spinal stenosis Previously able to ambulate and get around with walker, but for about the past week, unable to ambulate or turn herself in bed She describes decreased sensation to bilateral inner thighs? (L more prominent, inconsistent) - no bowel/bladder incontinence Bladder scan q6 (retaining on hospital day 1, voided without need for foley) ? If weakness changes related to edema and HF and hyperthyroidism, but MRI lumbar spine/brain obtained given concerning symptoms MRI brain/lumbar spine -> notable for multilevel degenerative changes of lumbar spine superimposed on a congenitally narrow canal.  Findings most pronounced at L2-3 level where there is severe canal stenosis and moderate to severe R foraminal stenosis (see report).  MRI brain without acute finding. Neurosurgery c/s - pt not interested in additional imaging or surgery at this time, will follow with neurosurgery  Follow sx  with treatment of above PT/OT  Microcytic anemia - noted [place] trait on patient history review - baseline Hgb ~11 g/dL - check iron labs (borderline low) and B12 (adequate)  Fever-resolved as of 01/08/2020 - may likely be related to thyroid storm however CXR on 10/26 notes subtle medial left base infiltrate concerning for possible PNA - reasonable to finish course of abx; continue Rocephin and Azithro.  Planning to treat for 5 days and stop - blood cx negative to date - UCx growing pansensitive E. coli.  Treatment for pneumonia will also cover  Chronic edema-resolved as of 01/07/2020 - B/L LE edema; seen recently by [facility] in June 2021 also - recommended for compression stockings as well; she has not purchased these yet but does want to wear them - edema seems to have worsened (on admission), see above workup  -She is also on amlodipine at home. Given chronic lower extremity edema this is on hold and would try not to resume at discharge and instead initiate different antihypertensive agent if still needed on top of ACEi - now with thyroid treatment, edema has resolved  Physical deconditioning -  patient grossly weak in bed, unable to sit up without much assistance; also not ambulating anymore recently at home (may improve some with CHF workup/tx) - see spinal stenosis and thyroid storm   Chronic kidney disease, stage 3a (HCC) - baseline creat ~1.1 - 1.2 - currently at baseline on admission - avoid nephrotoxic agents as able   Essential hypertension - hold amlodipine due to LE edema (see chronic edema) - continue ACEi - will add further agents if needed - holding imdur as well for now   Antimicrobials: Rocephin 10/25>> present  Azithro 10/25>>present  DVT prophylaxis: Lovenox Code Status: Full Family Communication: DIL bedside Disposition Plan: Status is: Inpatient  Remains inpatient appropriate because:Unsafe d/c plan, IV treatments appropriate due to intensity of illness or  inability to take PO and Inpatient level of care appropriate due to severity of illness   Dispo: The patient is from: Home              Anticipated d/c is to: SNF              Anticipated d/c date is: [facility] Monday 01/11/2020              Patient currently is not medically stable to d/c.  Objective: Blood pressure (!) 113/46, pulse 67, temperature 97.7 F (36.5 C), temperature source Oral, resp. rate 18, height  (1.6 m), weight 86 kg, SpO2 99 %.  Examination: General appearance: Pleasant woman resting in bed in no distress Head: Normocephalic, without obvious abnormality, atraumatic Eyes: EOMI Lungs: clear to auscultation bilaterally Heart: regular rate and rhythm and S1, S2 normal Abdomen: normal findings: bowel sounds normal and soft, non-tender Extremities: LE edema resolved Skin: mobility and turgor normal Neurologic: symmetric weakness in LE but improved  Consultants:   NSG  Procedures:   none  Data Reviewed: I have personally reviewed following labs and imaging studies Results for orders placed or performed during the hospital encounter of 01/02/20 (from the past 24 hour(s))  Glucose, capillary     Status: Abnormal   Collection Time: 01/07/20  5:44 PM  Result Value Ref Range   Glucose-Capillary 156 (H) 70 - 99 mg/dL   Comment 1 Notify RN    Comment 2 Document in Chart   Glucose, capillary     Status: Abnormal   Collection Time: 01/07/20  8:19 PM  Result Value Ref Range   Glucose-Capillary 121 (H) 70 - 99 mg/dL  T4, free     Status: Abnormal   Collection Time: 01/08/20  4:21 AM  Result Value Ref Range   Free T4 3.69 (H) 0.61 - 1.12 ng/dL  Basic metabolic panel     Status: Abnormal   Collection Time: 01/08/20  4:21 AM  Result Value Ref Range   Sodium 139 135 - 145 mmol/L   Potassium 3.2 (L) 3.5 - 5.1 mmol/L   Chloride 102 98 - 111 mmol/L   CO2 26 22 - 32 mmol/L   Glucose, Bld 88 70 - 99 mg/dL   BUN 79 (H) 8 - 23 mg/dL   Creatinine, Ser 1.09 (H) 0.44 - 1.00  mg/dL   Calcium 8.8 (L) 8.9 - 10.3 mg/dL   GFR, Estimated 49 (L) >60 mL/min   Anion gap 11 5 - 15  CBC with Differential/Platelet     Status: Abnormal   Collection Time: 01/08/20  4:21 AM  Result Value Ref Range   WBC 10.9 (H) 4.0 - 10.5 K/uL   RBC 4.35 3.87 - 5.11 MIL/uL  Hemoglobin 11.1 (L) 12.0 - 15.0 g/dL   HCT 24.2 (L) 36 - 46 %   MCV 76.1 (L) 80.0 - 100.0 fL   MCH 25.5 (L) 26.0 - 34.0 pg   MCHC 33.5 30.0 - 36.0 g/dL   RDW 35.3 61.4 - 43.1 %   Platelets 334 150 - 400 K/uL   nRBC 0.0 0.0 - 0.2 %   Neutrophils Relative % 83 %   Neutro Abs 9.1 (H) 1.7 - 7.7 K/uL   Lymphocytes Relative 11 %   Lymphs Abs 1.2 0.7 - 4.0 K/uL   Monocytes Relative 5 %   Monocytes Absolute 0.6 0.1 - 1.0 K/uL   Eosinophils Relative 0 %   Eosinophils Absolute 0.0 0.0 - 0.5 K/uL   Basophils Relative 0 %   Basophils Absolute 0.0 0.0 - 0.1 K/uL   Immature Granulocytes 1 %   Abs Immature Granulocytes 0.08 (H) 0.00 - 0.07 K/uL  Magnesium     Status: None   Collection Time: 01/08/20  4:21 AM  Result Value Ref Range   Magnesium 2.3 1.7 - 2.4 mg/dL  Glucose, capillary     Status: None   Collection Time: 01/08/20  7:52 AM  Result Value Ref Range   Glucose-Capillary 89 70 - 99 mg/dL  Glucose, capillary     Status: Abnormal   Collection Time: 01/08/20 11:59 AM  Result Value Ref Range   Glucose-Capillary 201 (H) 70 - 99 mg/dL    Recent Results (from the past 240 hour(s))  Respiratory Panel by RT PCR (Flu A&B, Covid) - Nasopharyngeal Swab     Status: None   Collection Time: 01/02/20 10:52 PM   Specimen: Nasopharyngeal Swab  Result Value Ref Range Status   SARS Coronavirus 2 by RT PCR NEGATIVE NEGATIVE Final    Comment: (NOTE) SARS-CoV-2 target nucleic acids are NOT DETECTED.  The SARS-CoV-2 RNA is generally detectable in upper respiratoy specimens during the acute phase of infection. The lowest concentration of SARS-CoV-2 viral copies this assay can detect is 131 copies/mL. A negative result does  not preclude SARS-Cov-2 infection and should not be used as the sole basis for treatment or other patient management decisions. A negative result may occur with  improper specimen collection/handling, submission of specimen other than nasopharyngeal swab, presence of viral mutation(s) within the areas targeted by this assay, and inadequate number of viral copies (<131 copies/mL). A negative result must be combined with clinical observations, patient history, and epidemiological information. The expected result is Negative.  Fact Sheet for Patients:  https://www.moore.com/  Fact Sheet for Healthcare Providers:  https://www.young.biz/  This test is no t yet approved or cleared by the [place] FDA and  has been authorized for detection and/or diagnosis of SARS-CoV-2 by FDA under an Emergency Use Authorization (EUA). This EUA will remain  in effect (meaning this test can be used) for the duration of the COVID-19 declaration under Section 564(b)(1) of the Act, 21 U.S.C. section 360bbb-3(b)(1), unless the authorization is terminated or revoked sooner.     Influenza A by PCR NEGATIVE NEGATIVE Final   Influenza B by PCR NEGATIVE NEGATIVE Final    Comment: (NOTE) The Xpert Xpress SARS-CoV-2/FLU/RSV assay is intended as an aid in  the diagnosis of influenza from Nasopharyngeal swab specimens and  should not be used as a sole basis for treatment. Nasal washings and  aspirates are unacceptable for Xpert Xpress SARS-CoV-2/FLU/RSV  testing.  Fact Sheet for Patients: https://www.moore.com/  Fact Sheet for Healthcare Providers: https://www.young.biz/  This test is not yet approved or cleared by the [place] and  has been authorized for detection and/or diagnosis of SARS-CoV-2 by  FDA under an Emergency Use Authorization (EUA). This EUA will remain  in effect (meaning this test can be used) for the  duration of the  Covid-19 declaration under Section 564(b)(1) of the Act, 21  U.S.C. section 360bbb-3(b)(1), unless the authorization is  terminated or revoked. Performed at [facility], [address], [place], [place] [address]   Culture, blood (routine x 2)     Status: None (Preliminary result)   Collection Time: 01/03/20  6:27 PM   Specimen: BLOOD RIGHT WRIST  Result Value Ref Range Status   Specimen Description   Final    BLOOD RIGHT WRIST Performed at [facility], [address] [place], [place] [address]    Special Requests   Final    BOTTLES DRAWN AEROBIC AND ANAEROBIC Blood Culture adequate volume Performed at [facility], [address], [place], [place] [address]    Culture   Final    NO GROWTH 4 DAYS Performed at [facility], [address] [place], [place] [address]    Report Status PENDING  Incomplete  Culture, blood (routine x 2)     Status: None (Preliminary result)   Collection Time: 01/03/20  6:42 PM   Specimen: BLOOD  Result Value Ref Range Status   Specimen Description   Final    BLOOD LEFT ANTECUBITAL Performed at [facility], [address], [place], [place] [address]    Special Requests   Final    BOTTLES DRAWN AEROBIC AND ANAEROBIC Blood Culture adequate volume Performed at [facility], [address], [place], [place] [address]    Culture   Final    NO GROWTH 4 DAYS Performed at [facility], [address] [place], [place] [address]    Report Status PENDING  Incomplete  Culture, Urine     Status: Abnormal   Collection Time: 01/04/20  8:02 AM   Specimen: Urine, Random  Result Value Ref Range Status   Specimen Description   Final    URINE, RANDOM Performed at [facility], [address] [place], [place] [address]    Special Requests   Final    NONE Performed at [facility], [address], [place], [place]  [address]    Culture >=100,000 COLONIES/mL ESCHERICHIA COLI (A)  Final   Report Status 01/07/2020 FINAL  Final   Organism ID, Bacteria ESCHERICHIA COLI (A)  Final      Susceptibility   Escherichia coli - MIC*    AMPICILLIN 4 SENSITIVE Sensitive     CEFAZOLIN <=4 SENSITIVE Sensitive     CEFTRIAXONE <=0.25 SENSITIVE Sensitive     CIPROFLOXACIN <=0.25 SENSITIVE Sensitive     GENTAMICIN <=1 SENSITIVE Sensitive     IMIPENEM 1 SENSITIVE Sensitive     NITROFURANTOIN <=16 SENSITIVE Sensitive     TRIMETH/SULFA <=20 SENSITIVE Sensitive     AMPICILLIN/SULBACTAM <=2 SENSITIVE Sensitive     PIP/TAZO <=4 SENSITIVE Sensitive     * >=100,000 COLONIES/mL ESCHERICHIA COLI     Radiology Studies: No results found. DG CHEST PORT 1 VIEW  Final Result    VAS [place] LOWER EXTREMITY VENOUS (DVT)  Final Result    DG CHEST PORT 1 VIEW  Final Result    MR LUMBAR SPINE WO CONTRAST  Final  Result    MR BRAIN WO CONTRAST  Final Result    DG Chest Port 1 View  Final Result    US THYROID    (Results Pending)    Scheduled Meds: . aspirin EC  81 mg Oral Daily  . brimonidine  1 drop Right Eye BID  . dorzolamide-timolol  1 drop Right Eye BID  . enoxaparin (LOVENOX) injection  40 mg Subcutaneous Daily  . gabapentin  100 mg Oral Daily  . gabapentin  200 mg Oral QHS  . influenza vaccine adjuvanted  0.5 mL Intramuscular Tomorrow-1000  . insulin aspart  0-20 Units Subcutaneous TID WC  . insulin aspart  0-5 Units Subcutaneous QHS  . Iodine Strong (Lugols)  0.2 mL Oral Q6H  . latanoprost  1 drop Right Eye QHS  . lisinopril  40 mg Oral Daily  . methimazole  20 mg Oral Q6H  . multivitamin with minerals  1 tablet Oral Daily  . pantoprazole  40 mg Oral Daily  . polyethylene glycol  17 g Oral Daily  . propranolol  20 mg Oral BID  . sodium chloride flush  3 mL Intravenous Q12H   PRN Meds: sodium chloride, acetaminophen, ondansetron (ZOFRAN) IV, sodium chloride flush Continuous Infusions: . sodium chloride     . cefTRIAXone (ROCEPHIN)  IV 2 g (01/07/20 1949)     LOS: 5 days  Time spent: Greater than 50% of the 35 minute visit was spent in counseling/coordination of care for the patient as laid out in the A&P.   Lewie Chamber, MD [facility] 01/08/2020, 3:21 PM

## 2020-01-08 NOTE — TOC Progression Note (Addendum)
Transition of Care [facility]) - Progression Note    Patient Details  Name: Rebecca [name] MRN: [id number] Date of Birth: 1932-10-06  Transition of Care [facility]) CM/SW Contact  Teo Moede, Olegario Messier, RN Phone Number: 01/08/2020, 12:31 PM  Clinical Narrative: [name] [employer] medicare-auth initiated for start of care 11/1-ref#[id number].faxed clinicals w/confirmation-await auth for [facility] SNF.Please order covid on Sunday.   2:50p-received auth-auth HQ#[id number] Harvey for 4 days-NRD 11/2 [name] [phone];Fax clinicals [phone].    Expected Discharge Plan: Skilled Nursing Facility Barriers to Discharge: Insurance Authorization  Expected Discharge Plan and Services Expected Discharge Plan: Skilled Nursing Facility   Discharge Planning Services: CM Consult   Living arrangements for the past 2 months: Single Family Home                                       Social Determinants of Health (SDOH) Interventions    Readmission Risk Interventions No flowsheet data found.

## 2020-01-08 NOTE — Progress Notes (Signed)
Physical Therapy Treatment Patient Details Name: Rebecca [name] MRN: [id number] DOB: 01-Nov-1932 Today's Date: 01/08/2020    History of Present Illness Rebecca [name] is an 86 yo female with PMH IDDM, HTN, CKD3a, HLD, GERD, CAD, chronic LE edema (PRN hctz at home) who presented to the ER from home due to SOB and worsening swelling in her legs and difficulty ambulating.MRI lumbar spine notable for multilevel degenerative changes of lumbar spine superimposed on a congenitally narrow canal ( Neurosurgery c/s - pt not interested in additional imaging or surgery at this time) and MRI of brain: no acute findings.    PT Comments    Patient progressing gradually with acute therapy and required reduced assist for bed mobility with rolling for clean up from BM. Max assist needed to sit up to EOB and min assist to stand in stedy from elevated bed height and 2+ assist continued for safety. Pt completed repeat sit<>stands for LE strengthening with min assist for initiating power up from paddles. She will continue to benefit from skilled PT interventions to progress mobility and address impairments, recommendation for SNF follow up remains appropriate.   Follow Up Recommendations  SNF;Supervision/Assistance - 24 hour     Equipment Recommendations  None recommended by PT    Recommendations for Other Services       Precautions / Restrictions Precautions Precautions: Fall Restrictions Weight Bearing Restrictions: No    Mobility  Bed Mobility Overal bed mobility: Needs Assistance Bed Mobility: Rolling;Sidelying to Sit Rolling: Min assist Sidelying to sit: Max assist       General bed mobility comments: cues for rolling in bed, pt reaching for bed rail with UE's and initiating LE mobility. She required min assist to complete rolls. patient required Max assist to rasie trunk up from Lt sidelying.  Transfers Overall transfer level: Needs assistance   Transfers: Sit to/from Stand;Stand Pivot  Transfers Sit to Stand: Min assist;+2 physical assistance;+2 safety/equipment;From elevated surface Stand pivot transfers: Total assist       General transfer comment: Min assist for power up from elevated EOB to [facility]. Pt requires +2 for safety. Pt able to complete 5x sit<>stand for LE strengthening in Stedy before sitting in recliner.   Ambulation/Gait                 Stairs             Wheelchair Mobility    Modified Rankin (Stroke Patients Only)       Balance Overall balance assessment: Needs assistance Sitting-balance support: Feet supported Sitting balance-Leahy Scale: Fair     Standing balance support: Bilateral upper extremity supported Standing balance-Leahy Scale: Poor Standing balance comment: reliant on external support                            Cognition Arousal/Alertness: Awake/alert Behavior During Therapy: WFL for tasks assessed/performed Overall Cognitive Status: Within Functional Limits for tasks assessed                                        Exercises      General Comments        Pertinent Vitals/Pain Pain Assessment: Faces Faces Pain Scale: Hurts little more Pain Location: LE's with movement and touch Pain Descriptors / Indicators: Discomfort Pain Intervention(s): Monitored during session    Home Living  Prior Function            PT Goals (current goals can now be found in the care plan section) Acute Rehab PT Goals Patient Stated Goal: regain strength and independence, be able to do housework PT Goal Formulation: With patient/family Time For Goal Achievement: 01/18/20 Potential to Achieve Goals: Good Progress towards PT goals: Progressing toward goals    Frequency    Min 3X/week      PT Plan Current plan remains appropriate    Co-evaluation   Reason for Co-Treatment: For patient/therapist safety;To address functional/ADL transfers PT goals  addressed during session: Mobility/safety with mobility;Balance;Proper use of DME OT goals addressed during session: ADL's and self-care      AM-PAC PT "6 Clicks" Mobility   Outcome Measure  Help needed turning from your back to your side while in a flat bed without using bedrails?: A Lot Help needed moving from lying on your back to sitting on the side of a flat bed without using bedrails?: A Lot Help needed moving to and from a bed to a chair (including a wheelchair)?: Total Help needed standing up from a chair using your arms (e.g., wheelchair or bedside chair)?: A Lot Help needed to walk in hospital room?: Total Help needed climbing 3-5 steps with a railing? : Total 6 Click Score: 9    End of Session Equipment Utilized During Treatment: Gait belt Activity Tolerance: Patient tolerated treatment well Patient left: in chair;with call bell/phone within reach;with chair alarm set Nurse Communication: Mobility status;Need for lift equipment PT Visit Diagnosis: Muscle weakness (generalized) (M62.81);Difficulty in walking, not elsewhere classified (R26.2)     Time: 1130-1156 PT Time Calculation (min) (ACUTE ONLY): 26 min  Charges:  $Therapeutic Activity: 8-22 mins                     Wynn Maudlin, DPT Acute Rehabilitation Services  Office [phone] Pager [phone]  01/08/2020 5:25 PM

## 2020-01-09 DIAGNOSIS — E0591 Thyrotoxicosis, unspecified with thyrotoxic crisis or storm: Secondary | ICD-10-CM | POA: Diagnosis not present

## 2020-01-09 LAB — CULTURE, BLOOD (ROUTINE X 2)
Culture: NO GROWTH
Culture: NO GROWTH
Special Requests: ADEQUATE
Special Requests: ADEQUATE

## 2020-01-09 LAB — GLUCOSE, CAPILLARY
Glucose-Capillary: 93 mg/dL (ref 70–99)
Glucose-Capillary: 208 mg/dL — ABNORMAL HIGH (ref 70–99)
Glucose-Capillary: 213 mg/dL — ABNORMAL HIGH (ref 70–99)
Glucose-Capillary: 185 mg/dL — ABNORMAL HIGH (ref 70–99)

## 2020-01-09 MED ORDER — LISINOPRIL 20 MG PO TABS
20.0000 mg | ORAL_TABLET | Freq: Every day | ORAL | Status: DC
  Administered 2020-01-10 – 2020-01-11 (×2): 20 mg via ORAL
  Filled 2020-01-09 (×3): qty 1

## 2020-01-09 MED ORDER — POLYETHYLENE GLYCOL 3350 17 G PO PACK: 17.0000 g | PACK | Freq: Every day | ORAL | Status: DC | PRN

## 2020-01-09 NOTE — Progress Notes (Signed)
PROGRESS NOTE    Rebecca [name]   XBJ:[id number]  DOB: [birth year]  DOA: 01/02/2020     6  PCP: Rebecca SanesBurns, Rebecca J, MD  CC: SOB, swelling in legs  Hospital Course: Rebecca [name] is an 86 yo female with PMH IDDM, HTN, CKD3a, HLD, GERD, CAD, chronic LE edema (PRN hctz at home) who presented to the ER from home due to SOB and worsening swelling in her legs. She also has had some varying glucose levels it appears and glucose level in the ER noted to be (261, 308).  She underwent workup with CXR and was found to have signs of volume overload with some pulmonary edema noted.  Her trop was initially 47 and on repeat was the same, 47.  BNP elevated 332.  EKG shows sinus rhythm with PVCs, no obvious ischemic changes.  No prior echo in epic, but patient states she was followed historically by Rebecca [name] and then Rebecca [name] but she cannot remember her last echo.   She is a slightly poor historian. Her son accompanies her in the ER but cannot provide much collateral information. Typically the patient lives alone she says and uses a cane for assistance with ambulation but over the past several days has not been able to ambulate out of bed and her son had to carry her to the bathroom she says. When asking her why she cannot ambulate she states that her swelling is preventing her from "feeling her feet" which is making her balance off.  She was treated with IV lasix 20 mg x 1 in the ER. An in and out cath was also performed. She is admitted for further diuresis and work-up of CHF.  Ultimately her thyroid studies returned grossly abnormal with suppressed TSH and elevated T4/T3. Along with her presenting symptoms she was considered to have thyroid storm and started on treatment.    Interval History:  No events overnight.  Had large BM in bed this am and was getting cleaned up. She was seen after resting in bed almost asleep and was comfortable. No family bedside this am. She felt well with no questions  this morning.   Needs Covid swab ordered on Sunday.  Old records reviewed in assessment of this patient  ROS: Constitutional: negative for chills and fevers, Respiratory: negative for cough, Cardiovascular: negative for chest pain and Gastrointestinal: negative  Assessment & Plan: * Thyroid storm - TSH suppressed, elevated free T3/T4.   She takes women's MVI with biotin which could interfere with initial labs No palpable goiter.  No lid lag or proptosis.  She notes heat intolerance, decreased appetite. Bilateral LE edema.  She's developed atrial fibrillation with RVR on 10/26 am, this in constellation with other symptoms concerning for thyroid storm (intermittent fever, atrial fibrillation with RVR with HR> 140, HF with LE edema, mild lethargy/malaise). - Free T4 is downtrending and responding to treatment - d/c hydrocortisone - continue iodine until discharge then stop - decrease methimazole to 40 mg daily. Change to methimazole 20 mg daily at discharge and follow-up with endocrinology -Heart rate also continues to come down.  Decrease propranolol to 20 mg twice daily - Follow free T4 q3 days -Follow-up thyroid ultrasound  SOB (shortness of breath)-resolved as of 01/06/2020 - given worsening LE edema, elevated BNP, pulmonary edema, and indeterminate trop concern is for CHF (no prior diagnosis nor workup)  - EF 60-65%, Grade II DD - strict I&O - trended troponin; lingered in mid 40s   Acute on chronic  diastolic CHF (congestive heart failure) (HCC) - see SOB workup - follow up echo: EF 60-65%, GrII DD - hold lasix for now; repeat BMP in am; LE edema has essentially resolved - BMP suggested overdiuresis; give NS@75  x 12 hours.  Responded well on 01/08/2020 -Monitor off fluids and Lasix for now  Diabetes [facility]) - last A1c 7.4% on 06/01/19 - repeat A1c now = 6.5% on 01/03/20 - continue on SSI and CBGs for now - d/c prandial coverage for now and watch - hold home NPH -Now off  steroids as of 01/08/2020.  Will hold Levemir and assess response and restart if needed at lower dose   CAD (coronary artery disease) - last seen by Rebecca [name] 11/01/14; she has history of PCI with 2 stents in the 1990s - calcifications noted on cxr; recent PAD workup with [facility] on 08/24/19 as well (heavily calcified LE but not felt to have PAD in LE), however still at risk for progressive CAD (see SOB workup as well) - continue asa, statin, ACEi  Atrial fibrillation with RVR (HCC)-resolved as of 01/07/2020 -No prior known history of A. fib.  This was considered provoked (and paroxysmal) in setting of thyroid storm and she has since converted back to NSR early morning of 01/06/2020 -Continue on telemetry -Will arrange for ZIO Patch at discharge for 2 weeks -Discussed de-escalation of treatment dose anticoagulation with patient and daughter-in-law at bedside.  For now will de-escalate to Lovenox 40 mg daily and no anticoagulation at discharge unless patient converts back to A. fib while on Zio patch - adjusting BB as needed (per thyroid storm)  Spinal stenosis Previously able to ambulate and get around with walker, but for about the past week, unable to ambulate or turn herself in bed She describes decreased sensation to bilateral inner thighs? (L more prominent, inconsistent) - no bowel/bladder incontinence Bladder scan q6 (retaining on hospital day 1, voided without need for foley) ? If weakness changes related to edema and HF and hyperthyroidism, but MRI lumbar spine/brain obtained given concerning symptoms MRI brain/lumbar spine -> notable for multilevel degenerative changes of lumbar spine superimposed on a congenitally narrow canal.  Findings most pronounced at L2-3 level where there is severe canal stenosis and moderate to severe R foraminal stenosis (see report).  MRI brain without acute finding. Neurosurgery c/s - pt not interested in additional imaging or surgery at this time, will follow  with neurosurgery  Follow sx with treatment of above PT/OT  Microcytic anemia - noted [place] trait on patient history review - baseline Hgb ~11 g/dL - check iron labs (borderline low) and B12 (adequate)  Fever-resolved as of 01/08/2020 - may likely be related to thyroid storm however CXR on 10/26 notes subtle medial left base infiltrate concerning for possible PNA - reasonable to finish course of abx; continue Rocephin and Azithro.  Planning to treat for 5 days and stop - blood cx negative to date - UCx growing pansensitive E. coli.  Treatment for pneumonia will also cover  Chronic edema-resolved as of 01/07/2020 - B/L LE edema; seen recently by [facility] in June 2021 also - recommended for compression stockings as well; she has not purchased these yet but does want to wear them - edema seems to have worsened (on admission), see above workup  -She is also on amlodipine at home. Given chronic lower extremity edema this is on hold and would try not to resume at discharge and instead initiate different antihypertensive agent if still needed on top of ACEi -  now with thyroid treatment, edema has resolved  Physical deconditioning - patient grossly weak in bed, unable to sit up without much assistance; also not ambulating anymore recently at home (may improve some with CHF workup/tx) - see spinal stenosis and thyroid storm   Chronic kidney disease, stage 3a (HCC) - baseline creat ~1.1 - 1.2 - currently at baseline on admission - avoid nephrotoxic agents as able   Essential hypertension - hold amlodipine due to LE edema (see chronic edema) - continue ACEi - will add further agents if needed - holding imdur as well for now   Antimicrobials: Rocephin 10/25>> 10/29 Azithro 10/25>>10/29  DVT prophylaxis: Lovenox Code Status: Full Family Communication: none present today Disposition Plan: Status is: Inpatient  Remains inpatient appropriate because:Unsafe d/c plan, IV treatments appropriate  due to intensity of illness or inability to take PO and Inpatient level of care appropriate due to severity of illness   Dispo: The patient is from: Home              Anticipated d/c is to: SNF              Anticipated d/c date is: [facility] Kitchen Monday 01/11/2020              Patient currently is not medically stable to d/c.  Objective: Blood pressure (!) 128/47, pulse 62, temperature 98.2 F (36.8 C), temperature source Oral, resp. rate 16, height  (1.6 m), weight 86.5 kg, SpO2 97 %.  Examination: General appearance: Pleasant woman resting in bed in no distress Head: Normocephalic, without obvious abnormality, atraumatic Eyes: EOMI Lungs: clear to auscultation bilaterally Heart: regular rate and rhythm and S1, S2 normal Abdomen: normal findings: bowel sounds normal and soft, non-tender Extremities: LE edema resolved Skin: mobility and turgor normal Neurologic: symmetric weakness in LE but improved  Consultants:   NSG  Procedures:   none  Data Reviewed: I have personally reviewed following labs and imaging studies Results for orders placed or performed during the hospital encounter of 01/02/20 (from the past 24 hour(s))  Glucose, capillary     Status: None   Collection Time: 01/08/20  5:11 PM  Result Value Ref Range   Glucose-Capillary 95 70 - 99 mg/dL  Glucose, capillary     Status: Abnormal   Collection Time: 01/08/20  9:13 PM  Result Value Ref Range   Glucose-Capillary 101 (H) 70 - 99 mg/dL  Glucose, capillary     Status: None   Collection Time: 01/09/20  7:39 AM  Result Value Ref Range   Glucose-Capillary 93 70 - 99 mg/dL  Glucose, capillary     Status: Abnormal   Collection Time: 01/09/20 12:36 PM  Result Value Ref Range   Glucose-Capillary 208 (H) 70 - 99 mg/dL    Recent Results (from the past 240 hour(s))  Respiratory Panel by RT PCR (Flu A&B, Covid) - Nasopharyngeal Swab     Status: None   Collection Time: 01/02/20 10:52 PM   Specimen: Nasopharyngeal Swab  Result  Value Ref Range Status   SARS Coronavirus 2 by RT PCR NEGATIVE NEGATIVE Final    Comment: (NOTE) SARS-CoV-2 target nucleic acids are NOT DETECTED.  The SARS-CoV-2 RNA is generally detectable in upper respiratoy specimens during the acute phase of infection. The lowest concentration of SARS-CoV-2 viral copies this assay can detect is 131 copies/mL. A negative result does not preclude SARS-Cov-2 infection and should not be used as the sole basis for treatment or other patient management decisions. A negative  result may occur with  improper specimen collection/handling, submission of specimen other than nasopharyngeal swab, presence of viral mutation(s) within the areas targeted by this assay, and inadequate number of viral copies (<131 copies/mL). A negative result must be combined with clinical observations, patient history, and epidemiological information. The expected result is Negative.  Fact Sheet for Patients:  https://www.moore.com/  Fact Sheet for Healthcare Providers:  https://www.young.biz/  This test is no t yet approved or cleared by the [place] FDA and  has been authorized for detection and/or diagnosis of SARS-CoV-2 by FDA under an Emergency Use Authorization (EUA). This EUA will remain  in effect (meaning this test can be used) for the duration of the COVID-19 declaration under Section 564(b)(1) of the Act, 21 U.S.C. section 360bbb-3(b)(1), unless the authorization is terminated or revoked sooner.     Influenza A by PCR NEGATIVE NEGATIVE Final   Influenza B by PCR NEGATIVE NEGATIVE Final    Comment: (NOTE) The Xpert Xpress SARS-CoV-2/FLU/RSV assay is intended as an aid in  the diagnosis of influenza from Nasopharyngeal swab specimens and  should not be used as a sole basis for treatment. Nasal washings and  aspirates are unacceptable for Xpert Xpress SARS-CoV-2/FLU/RSV  testing.  Fact Sheet for  Patients: https://www.moore.com/  Fact Sheet for Healthcare Providers: https://www.young.biz/  This test is not yet approved or cleared by the [place] FDA and  has been authorized for detection and/or diagnosis of SARS-CoV-2 by  FDA under an Emergency Use Authorization (EUA). This EUA will remain  in effect (meaning this test can be used) for the duration of the  Covid-19 declaration under Section 564(b)(1) of the Act, 21  U.S.C. section 360bbb-3(b)(1), unless the authorization is  terminated or revoked. Performed at [facility], [address] [place], [place] [address]   Culture, blood (routine x 2)     Status: None   Collection Time: 01/03/20  6:27 PM   Specimen: BLOOD RIGHT WRIST  Result Value Ref Range Status   Specimen Description   Final    BLOOD RIGHT WRIST Performed at [facility], [address] [place], [place] [address]    Special Requests   Final    BOTTLES DRAWN AEROBIC AND ANAEROBIC Blood Culture adequate volume Performed at [facility], [address], [place], [place] [address]    Culture   Final    NO GROWTH 5 DAYS Performed at [facility], [address] [place], [place] [address]    Report Status 01/09/2020 FINAL  Final  Culture, blood (routine x 2)     Status: None   Collection Time: 01/03/20  6:42 PM   Specimen: BLOOD  Result Value Ref Range Status   Specimen Description   Final    BLOOD LEFT ANTECUBITAL Performed at [facility], [address], [place], [place] [address]    Special Requests   Final    BOTTLES DRAWN AEROBIC AND ANAEROBIC Blood Culture adequate volume Performed at [facility], [address] [place], [place] [address]    Culture   Final    NO GROWTH 5 DAYS Performed at [facility], [address] [place], [place] [address]    Report Status 01/09/2020 FINAL  Final  Culture, Urine      Status: Abnormal   Collection Time: 01/04/20  8:02 AM   Specimen: Urine, Random  Result Value Ref Range Status   Specimen Description   Final  URINE, RANDOM Performed at [facility], [address], [place], [place] [address]    Special Requests   Final    NONE Performed at [facility], [address], [place], [place] [address]    Culture >=100,000 COLONIES/mL ESCHERICHIA COLI (A)  Final   Report Status 01/07/2020 FINAL  Final   Organism ID, Bacteria ESCHERICHIA COLI (A)  Final      Susceptibility   Escherichia coli - MIC*    AMPICILLIN 4 SENSITIVE Sensitive     CEFAZOLIN <=4 SENSITIVE Sensitive     CEFTRIAXONE <=0.25 SENSITIVE Sensitive     CIPROFLOXACIN <=0.25 SENSITIVE Sensitive     GENTAMICIN <=1 SENSITIVE Sensitive     IMIPENEM 1 SENSITIVE Sensitive     NITROFURANTOIN <=16 SENSITIVE Sensitive     TRIMETH/SULFA <=20 SENSITIVE Sensitive     AMPICILLIN/SULBACTAM <=2 SENSITIVE Sensitive     PIP/TAZO <=4 SENSITIVE Sensitive     * >=100,000 COLONIES/mL ESCHERICHIA COLI     Radiology Studies: US THYROID  Result Date: 01/08/2020 CLINICAL DATA:  Thyroid storm. EXAM: THYROID ULTRASOUND TECHNIQUE: Ultrasound examination of the thyroid gland and adjacent soft tissues was performed. COMPARISON:  06/20/2006 FINDINGS: Parenchymal Echotexture: Markedly heterogenous Isthmus: 1.2 Right lobe: 3.4 x 1.6 x 2.2 cm Left lobe: 4.8 x 2.2 x 2 cm _________________________________________________________ Estimated total number of nodules >/= 1 cm: 0 Number of spongiform nodules >/=  2 cm not described below (TR1): 0 Number of mixed cystic and solid nodules >/= 1.5 cm not described below (TR2): 0 _________________________________________________________ No discrete nodules are seen within the thyroid gland. IMPRESSION: Markedly heterogeneous thyroid gland without evidence for distinct thyroid nodule. The above is in keeping with the ACR TI-RADS recommendations - [name]  Radiol 2017;14:587-595. Electronically Signed   By: Katherine Mantle M.D.   On: 01/08/2020 17:40   US THYROID  Final Result    DG CHEST PORT 1 VIEW  Final Result    VAS [place] LOWER EXTREMITY VENOUS (DVT)  Final Result    DG CHEST PORT 1 VIEW  Final Result    MR LUMBAR SPINE WO CONTRAST  Final Result    MR BRAIN WO CONTRAST  Final Result    DG Chest Port 1 View  Final Result      Scheduled Meds: . aspirin EC  81 mg Oral Daily  . brimonidine  1 drop Right Eye BID  . dorzolamide-timolol  1 drop Right Eye BID  . enoxaparin (LOVENOX) injection  40 mg Subcutaneous Daily  . gabapentin  100 mg Oral Daily  . gabapentin  200 mg Oral QHS  . insulin aspart  0-20 Units Subcutaneous TID WC  . insulin aspart  0-5 Units Subcutaneous QHS  . Iodine Strong (Lugols)  0.2 mL Oral Q6H  . latanoprost  1 drop Right Eye QHS  . lisinopril  20 mg Oral Daily  . methimazole  20 mg Oral Q6H  . multivitamin with minerals  1 tablet Oral Daily  . pantoprazole  40 mg Oral Daily  . propranolol  20 mg Oral BID  . sodium chloride flush  3 mL Intravenous Q12H   PRN Meds: sodium chloride, acetaminophen, ondansetron (ZOFRAN) IV, polyethylene glycol, sodium chloride flush Continuous Infusions: . sodium chloride       LOS: 6 days  Time spent: Greater than 50% of the 35 minute visit was spent in counseling/coordination of care for the patient as laid out in the A&P.   Lewie Chamber, MD  [facility] 01/09/2020, 2:19 PM

## 2020-01-10 DIAGNOSIS — E0591 Thyrotoxicosis, unspecified with thyrotoxic crisis or storm: Secondary | ICD-10-CM | POA: Diagnosis not present

## 2020-01-10 LAB — GLUCOSE, CAPILLARY
Glucose-Capillary: 296 mg/dL — ABNORMAL HIGH (ref 70–99)
Glucose-Capillary: 218 mg/dL — ABNORMAL HIGH (ref 70–99)
Glucose-Capillary: 222 mg/dL — ABNORMAL HIGH (ref 70–99)
Glucose-Capillary: 186 mg/dL — ABNORMAL HIGH (ref 70–99)

## 2020-01-10 NOTE — Progress Notes (Signed)
PROGRESS NOTE    Rebecca [name]   GXQ:[id number]  DOB: January 14, 1933  DOA: 01/02/2020     7  PCP: Pincus Sanes, MD  CC: SOB, swelling in legs  Hospital Course: Ms. Quintin is an 86 yo female with PMH IDDM, HTN, CKD3a, HLD, GERD, CAD, chronic LE edema (PRN hctz at home) who presented to the ER from home due to SOB and worsening swelling in her legs. She also has had some varying glucose levels it appears and glucose level in the ER noted to be (261, 308).  She underwent workup with CXR and was found to have signs of volume overload with some pulmonary edema noted.  Her trop was initially 47 and on repeat was the same, 47.  BNP elevated 332.  EKG shows sinus rhythm with PVCs, no obvious ischemic changes.  No prior echo in epic, but patient states she was followed historically by Dr. Riley Kill and then Dr. Shirlee Latch but she cannot remember her last echo.   She is a slightly poor historian. Her son accompanies her in the ER but cannot provide much collateral information. Typically the patient lives alone she says and uses a cane for assistance with ambulation but over the past several days has not been able to ambulate out of bed and her son had to carry her to the bathroom she says. When asking her why she cannot ambulate she states that her swelling is preventing her from "feeling her feet" which is making her balance off.  She was treated with IV lasix 20 mg x 1 in the ER. An in and out cath was also performed. She is admitted for further diuresis and work-up of CHF.  Ultimately her thyroid studies returned grossly abnormal with suppressed TSH and elevated T4/T3. Along with her presenting symptoms she was considered to have thyroid storm and started on treatment.    Interval History:  No events overnight. Eating b'fast this am in bed. Feels well and understands tentative plan is for d/c tomorrow to rehab.   Needs Covid swab ordered on Sunday.  Old records reviewed in assessment of  this patient  ROS: Constitutional: negative for chills and fevers, Respiratory: negative for cough, Cardiovascular: negative for chest pain and Gastrointestinal: negative  Assessment & Plan: * Thyroid storm - TSH suppressed, elevated free T3/T4.   She takes women's MVI with biotin which could interfere with initial labs No palpable goiter.  No lid lag or proptosis.  She notes heat intolerance, decreased appetite. Bilateral LE edema.  She's developed atrial fibrillation with RVR on 10/26 am, this in constellation with other symptoms concerning for thyroid storm (intermittent fever, atrial fibrillation with RVR with HR> 140, HF with LE edema, mild lethargy/malaise). - Free T4 is downtrending and responding to treatment - d/c hydrocortisone - continue iodine until discharge then stop - decrease methimazole to 40 mg daily. Change to methimazole 20 mg daily at discharge and follow-up with endocrinology -Heart rate also continues to come down.  Decrease propranolol to 20 mg twice daily - Follow free T4 q3 days -Thyroid ultrasound shows heterogenous thyroid with no discrete nodules  SOB (shortness of breath)-resolved as of 01/06/2020 - given worsening LE edema, elevated BNP, pulmonary edema, and indeterminate trop concern is for CHF (no prior diagnosis nor workup)  - EF 60-65%, Grade II DD - strict I&O - trended troponin; lingered in mid 40s   Acute on chronic diastolic CHF (congestive heart failure) (HCC) - see SOB workup - follow up echo:  EF 60-65%, GrII DD - hold lasix for now; repeat BMP in am; LE edema has essentially resolved - BMP suggested overdiuresis; give NS@75  x 12 hours.  Responded well on 01/08/2020 -Monitor off fluids and Lasix for now  Diabetes [facility](HCC) - last A1c 7.4% on 06/01/19 - repeat A1c now = 6.5% on 01/03/20 - continue on SSI and CBGs for now - d/c prandial coverage for now and watch - hold home NPH -Now off steroids as of 01/08/2020.  Will hold Levemir and assess  response and restart if needed at lower dose   CAD (coronary artery disease) - last seen by Dr. Shirlee LatchMcLean 11/01/14; she has history of PCI with 2 stents in the 1990s - calcifications noted on cxr; recent PAD workup with [facility] on 08/24/19 as well (heavily calcified LE but not felt to have PAD in LE), however still at risk for progressive CAD (see SOB workup as well) - continue asa, statin, ACEi  Atrial fibrillation with RVR (HCC)-resolved as of 01/07/2020 -No prior known history of A. fib.  This was considered provoked (and paroxysmal) in setting of thyroid storm and she has since converted back to NSR early morning of 01/06/2020 -Continue on telemetry -Will arrange for ZIO Patch at discharge for 2 weeks -Discussed de-escalation of treatment dose anticoagulation with patient and daughter-in-law at bedside.  For now will de-escalate to Lovenox 40 mg daily and no anticoagulation at discharge unless patient converts back to A. fib while on Zio patch - adjusting BB as needed (per thyroid storm)  Spinal stenosis Previously able to ambulate and get around with walker, but for about the past week, unable to ambulate or turn herself in bed She describes decreased sensation to bilateral inner thighs? (L more prominent, inconsistent) - no bowel/bladder incontinence Bladder scan q6 (retaining on hospital day 1, voided without need for foley) ? If weakness changes related to edema and HF and hyperthyroidism, but MRI lumbar spine/brain obtained given concerning symptoms MRI brain/lumbar spine -> notable for multilevel degenerative changes of lumbar spine superimposed on a congenitally narrow canal.  Findings most pronounced at L2-3 level where there is severe canal stenosis and moderate to severe R foraminal stenosis (see report).  MRI brain without acute finding. Neurosurgery c/s - pt not interested in additional imaging or surgery at this time, will follow with neurosurgery  Follow sx with treatment of  above PT/OT  Microcytic anemia - noted [place] trait on patient history review - baseline Hgb ~11 g/dL - check iron labs (borderline low) and B12 (adequate)  Fever-resolved as of 01/08/2020 - may likely be related to thyroid storm however CXR on 10/26 notes subtle medial left base infiltrate concerning for possible PNA - reasonable to finish course of abx; continue Rocephin and Azithro.  Planning to treat for 5 days and stop - blood cx negative to date - UCx growing pansensitive E. coli.  Treatment for pneumonia will also cover  Chronic edema-resolved as of 01/07/2020 - B/L LE edema; seen recently by [facility] in June 2021 also - recommended for compression stockings as well; she has not purchased these yet but does want to wear them - edema seems to have worsened (on admission), see above workup  -She is also on amlodipine at home. Given chronic lower extremity edema this is on hold and would try not to resume at discharge and instead initiate different antihypertensive agent if still needed on top of ACEi - now with thyroid treatment, edema has resolved  Physical deconditioning - patient grossly  weak in bed, unable to sit up without much assistance; also not ambulating anymore recently at home (may improve some with CHF workup/tx) - see spinal stenosis and thyroid storm   Chronic kidney disease, stage 3a (HCC) - baseline creat ~1.1 - 1.2 - currently at baseline on admission - avoid nephrotoxic agents as able   Essential hypertension - hold amlodipine due to LE edema (see chronic edema) - continue ACEi - will add further agents if needed - holding imdur as well for now   Antimicrobials: Rocephin 10/25>> 10/29 Azithro 10/25>>10/29  DVT prophylaxis: Lovenox Code Status: Full Family Communication: none present today Disposition Plan: Status is: Inpatient  Remains inpatient appropriate because:Unsafe d/c plan, IV treatments appropriate due to intensity of illness or inability to take  PO and Inpatient level of care appropriate due to severity of illness   Dispo: The patient is from: Home              Anticipated d/c is to: SNF              Anticipated d/c date is: [facility] Monday 01/11/2020              Patient currently is not medically stable to d/c.  Objective: Blood pressure (!) 154/46, pulse 72, temperature 99.2 F (37.3 C), temperature source Oral, resp. rate 18, height  (1.6 m), weight 86.5 kg, SpO2 96 %.  Examination: General appearance: Pleasant woman resting in bed in no distress Head: Normocephalic, without obvious abnormality, atraumatic Eyes: EOMI Lungs: clear to auscultation bilaterally Heart: regular rate and rhythm and S1, S2 normal Abdomen: normal findings: bowel sounds normal and soft, non-tender Extremities: LE edema resolved Skin: mobility and turgor normal Neurologic: symmetric weakness in LE but improved  Consultants:   NSG  Procedures:   none  Data Reviewed: I have personally reviewed following labs and imaging studies Results for orders placed or performed during the hospital encounter of 01/02/20 (from the past 24 hour(s))  Glucose, capillary     Status: Abnormal   Collection Time: 01/09/20 12:36 PM  Result Value Ref Range   Glucose-Capillary 208 (H) 70 - 99 mg/dL  Glucose, capillary     Status: Abnormal   Collection Time: 01/09/20  4:20 PM  Result Value Ref Range   Glucose-Capillary 213 (H) 70 - 99 mg/dL  Glucose, capillary     Status: Abnormal   Collection Time: 01/09/20  8:48 PM  Result Value Ref Range   Glucose-Capillary 185 (H) 70 - 99 mg/dL  Glucose, capillary     Status: Abnormal   Collection Time: 01/10/20  7:45 AM  Result Value Ref Range   Glucose-Capillary 296 (H) 70 - 99 mg/dL    Recent Results (from the past 240 hour(s))  Respiratory Panel by RT PCR (Flu A&B, Covid) - Nasopharyngeal Swab     Status: None   Collection Time: 01/02/20 10:52 PM   Specimen: Nasopharyngeal Swab  Result Value Ref Range Status   SARS  Coronavirus 2 by RT PCR NEGATIVE NEGATIVE Final    Comment: (NOTE) SARS-CoV-2 target nucleic acids are NOT DETECTED.  The SARS-CoV-2 RNA is generally detectable in upper respiratoy specimens during the acute phase of infection. The lowest concentration of SARS-CoV-2 viral copies this assay can detect is 131 copies/mL. A negative result does not preclude SARS-Cov-2 infection and should not be used as the sole basis for treatment or other patient management decisions. A negative result may occur with  improper specimen collection/handling, submission of specimen  other than nasopharyngeal swab, presence of viral mutation(s) within the areas targeted by this assay, and inadequate number of viral copies (<131 copies/mL). A negative result must be combined with clinical observations, patient history, and epidemiological information. The expected result is Negative.  Fact Sheet for Patients:  https://www.moore.com/  Fact Sheet for Healthcare Providers:  https://www.young.biz/  This test is no t yet approved or cleared by the [place] FDA and  has been authorized for detection and/or diagnosis of SARS-CoV-2 by FDA under an Emergency Use Authorization (EUA). This EUA will remain  in effect (meaning this test can be used) for the duration of the COVID-19 declaration under Section 564(b)(1) of the Act, 21 U.S.C. section 360bbb-3(b)(1), unless the authorization is terminated or revoked sooner.     Influenza A by PCR NEGATIVE NEGATIVE Final   Influenza B by PCR NEGATIVE NEGATIVE Final    Comment: (NOTE) The Xpert Xpress SARS-CoV-2/FLU/RSV assay is intended as an aid in  the diagnosis of influenza from Nasopharyngeal swab specimens and  should not be used as a sole basis for treatment. Nasal washings and  aspirates are unacceptable for Xpert Xpress SARS-CoV-2/FLU/RSV  testing.  Fact Sheet for  Patients: https://www.moore.com/  Fact Sheet for Healthcare Providers: https://www.young.biz/  This test is not yet approved or cleared by the [place] FDA and  has been authorized for detection and/or diagnosis of SARS-CoV-2 by  FDA under an Emergency Use Authorization (EUA). This EUA will remain  in effect (meaning this test can be used) for the duration of the  Covid-19 declaration under Section 564(b)(1) of the Act, 21  U.S.C. section 360bbb-3(b)(1), unless the authorization is  terminated or revoked. Performed at [facility], [address], [place], [place] [address]   Culture, blood (routine x 2)     Status: None   Collection Time: 01/03/20  6:27 PM   Specimen: BLOOD RIGHT WRIST  Result Value Ref Range Status   Specimen Description   Final    BLOOD RIGHT WRIST Performed at [facility], [address] [place], [place] [address]    Special Requests   Final    BOTTLES DRAWN AEROBIC AND ANAEROBIC Blood Culture adequate volume Performed at [facility], [address], [place], [place] [address]    Culture   Final    NO GROWTH 5 DAYS Performed at [facility], [address] [place], [place] [address]    Report Status 01/09/2020 FINAL  Final  Culture, blood (routine x 2)     Status: None   Collection Time: 01/03/20  6:42 PM   Specimen: BLOOD  Result Value Ref Range Status   Specimen Description   Final    BLOOD LEFT ANTECUBITAL Performed at [facility], [address] [place], [place] [address]    Special Requests   Final    BOTTLES DRAWN AEROBIC AND ANAEROBIC Blood Culture adequate volume Performed at [facility], [address], [place], [place] [address]    Culture   Final    NO GROWTH 5 DAYS Performed at [facility], [address], [place], [place] [address]    Report Status 01/09/2020 FINAL  Final  Culture, Urine      Status: Abnormal   Collection Time: 01/04/20  8:02 AM   Specimen: Urine, Random  Result Value Ref Range Status   Specimen Description   Final    URINE, RANDOM Performed at [facility],  [address] [place], [place] [address]    Special Requests   Final    NONE Performed at [facility], [address], [place], [place] [address]    Culture >=100,000 COLONIES/mL ESCHERICHIA COLI (A)  Final   Report Status 01/07/2020 FINAL  Final   Organism ID, Bacteria ESCHERICHIA COLI (A)  Final      Susceptibility   Escherichia coli - MIC*    AMPICILLIN 4 SENSITIVE Sensitive     CEFAZOLIN <=4 SENSITIVE Sensitive     CEFTRIAXONE <=0.25 SENSITIVE Sensitive     CIPROFLOXACIN <=0.25 SENSITIVE Sensitive     GENTAMICIN <=1 SENSITIVE Sensitive     IMIPENEM 1 SENSITIVE Sensitive     NITROFURANTOIN <=16 SENSITIVE Sensitive     TRIMETH/SULFA <=20 SENSITIVE Sensitive     AMPICILLIN/SULBACTAM <=2 SENSITIVE Sensitive     PIP/TAZO <=4 SENSITIVE Sensitive     * >=100,000 COLONIES/mL ESCHERICHIA COLI     Radiology Studies: US THYROID  Result Date: 01/08/2020 CLINICAL DATA:  Thyroid storm. EXAM: THYROID ULTRASOUND TECHNIQUE: Ultrasound examination of the thyroid gland and adjacent soft tissues was performed. COMPARISON:  06/20/2006 FINDINGS: Parenchymal Echotexture: Markedly heterogenous Isthmus: 1.2 Right lobe: 3.4 x 1.6 x 2.2 cm Left lobe: 4.8 x 2.2 x 2 cm _________________________________________________________ Estimated total number of nodules >/= 1 cm: 0 Number of spongiform nodules >/=  2 cm not described below (TR1): 0 Number of mixed cystic and solid nodules >/= 1.5 cm not described below (TR2): 0 _________________________________________________________ No discrete nodules are seen within the thyroid gland. IMPRESSION: Markedly heterogeneous thyroid gland without evidence for distinct thyroid nodule. The above is in keeping with the ACR TI-RADS recommendations - J Am Coll  Radiol 2017;14:587-595. Electronically Signed   By: Katherine Mantle M.D.   On: 01/08/2020 17:40   US THYROID  Final Result    DG CHEST PORT 1 VIEW  Final Result    VAS [place] LOWER EXTREMITY VENOUS (DVT)  Final Result    DG CHEST PORT 1 VIEW  Final Result    MR LUMBAR SPINE WO CONTRAST  Final Result    MR BRAIN WO CONTRAST  Final Result    DG Chest Port 1 View  Final Result      Scheduled Meds: . aspirin EC  81 mg Oral Daily  . brimonidine  1 drop Right Eye BID  . dorzolamide-timolol  1 drop Right Eye BID  . enoxaparin (LOVENOX) injection  40 mg Subcutaneous Daily  . gabapentin  100 mg Oral Daily  . gabapentin  200 mg Oral QHS  . insulin aspart  0-20 Units Subcutaneous TID WC  . insulin aspart  0-5 Units Subcutaneous QHS  . Iodine Strong (Lugols)  0.2 mL Oral Q6H  . latanoprost  1 drop Right Eye QHS  . lisinopril  20 mg Oral Daily  . methimazole  20 mg Oral Q6H  . multivitamin with minerals  1 tablet Oral Daily  . pantoprazole  40 mg Oral Daily  . propranolol  20 mg Oral BID  . sodium chloride flush  3 mL Intravenous Q12H   PRN Meds: sodium chloride, acetaminophen, ondansetron (ZOFRAN) IV, polyethylene glycol, sodium chloride flush Continuous Infusions: . sodium chloride       LOS: 7 days  Time spent: Greater than 50% of the 35 minute visit was spent in counseling/coordination of care for the patient as laid out in the A&P.   Lewie Chamber, MD [facility] 01/10/2020, 11:45 AM

## 2020-01-11 ENCOUNTER — Encounter (INDEPENDENT_AMBULATORY_CARE_PROVIDER_SITE_OTHER): Payer: Medicare Other | Admitting: Ophthalmology

## 2020-01-11 ENCOUNTER — Inpatient Hospital Stay (HOSPITAL_COMMUNITY): Payer: Medicare Other

## 2020-01-11 DIAGNOSIS — M7989 Other specified soft tissue disorders: Secondary | ICD-10-CM

## 2020-01-11 DIAGNOSIS — I480 Paroxysmal atrial fibrillation: Secondary | ICD-10-CM | POA: Diagnosis not present

## 2020-01-11 DIAGNOSIS — Z955 Presence of coronary angioplasty implant and graft: Secondary | ICD-10-CM | POA: Diagnosis not present

## 2020-01-11 DIAGNOSIS — M1711 Unilateral primary osteoarthritis, right knee: Secondary | ICD-10-CM | POA: Diagnosis not present

## 2020-01-11 DIAGNOSIS — M48062 Spinal stenosis, lumbar region with neurogenic claudication: Secondary | ICD-10-CM | POA: Diagnosis not present

## 2020-01-11 DIAGNOSIS — E1142 Type 2 diabetes mellitus with diabetic polyneuropathy: Secondary | ICD-10-CM | POA: Diagnosis not present

## 2020-01-11 DIAGNOSIS — I2699 Other pulmonary embolism without acute cor pulmonale: Secondary | ICD-10-CM | POA: Diagnosis not present

## 2020-01-11 DIAGNOSIS — I517 Cardiomegaly: Secondary | ICD-10-CM | POA: Diagnosis not present

## 2020-01-11 DIAGNOSIS — D573 Sickle-cell trait: Secondary | ICD-10-CM | POA: Diagnosis present

## 2020-01-11 DIAGNOSIS — M2550 Pain in unspecified joint: Secondary | ICD-10-CM | POA: Diagnosis not present

## 2020-01-11 DIAGNOSIS — I4891 Unspecified atrial fibrillation: Secondary | ICD-10-CM | POA: Diagnosis not present

## 2020-01-11 DIAGNOSIS — M1710 Unilateral primary osteoarthritis, unspecified knee: Secondary | ICD-10-CM | POA: Diagnosis not present

## 2020-01-11 DIAGNOSIS — E7800 Pure hypercholesterolemia, unspecified: Secondary | ICD-10-CM | POA: Diagnosis not present

## 2020-01-11 DIAGNOSIS — R579 Shock, unspecified: Secondary | ICD-10-CM | POA: Diagnosis not present

## 2020-01-11 DIAGNOSIS — N1831 Chronic kidney disease, stage 3a: Secondary | ICD-10-CM | POA: Diagnosis not present

## 2020-01-11 DIAGNOSIS — Z7982 Long term (current) use of aspirin: Secondary | ICD-10-CM | POA: Diagnosis not present

## 2020-01-11 DIAGNOSIS — Z7401 Bed confinement status: Secondary | ICD-10-CM | POA: Diagnosis not present

## 2020-01-11 DIAGNOSIS — Z743 Need for continuous supervision: Secondary | ICD-10-CM | POA: Diagnosis not present

## 2020-01-11 DIAGNOSIS — R55 Syncope and collapse: Secondary | ICD-10-CM | POA: Diagnosis not present

## 2020-01-11 DIAGNOSIS — Z03818 Encounter for observation for suspected exposure to other biological agents ruled out: Secondary | ICD-10-CM | POA: Diagnosis not present

## 2020-01-11 DIAGNOSIS — J90 Pleural effusion, not elsewhere classified: Secondary | ICD-10-CM | POA: Diagnosis not present

## 2020-01-11 DIAGNOSIS — M6281 Muscle weakness (generalized): Secondary | ICD-10-CM | POA: Diagnosis not present

## 2020-01-11 DIAGNOSIS — M4800 Spinal stenosis, site unspecified: Secondary | ICD-10-CM | POA: Diagnosis not present

## 2020-01-11 DIAGNOSIS — I499 Cardiac arrhythmia, unspecified: Secondary | ICD-10-CM | POA: Diagnosis not present

## 2020-01-11 DIAGNOSIS — R269 Unspecified abnormalities of gait and mobility: Secondary | ICD-10-CM | POA: Diagnosis not present

## 2020-01-11 DIAGNOSIS — E042 Nontoxic multinodular goiter: Secondary | ICD-10-CM | POA: Diagnosis not present

## 2020-01-11 DIAGNOSIS — M545 Low back pain, unspecified: Secondary | ICD-10-CM | POA: Diagnosis not present

## 2020-01-11 DIAGNOSIS — R0902 Hypoxemia: Secondary | ICD-10-CM | POA: Diagnosis not present

## 2020-01-11 DIAGNOSIS — E0590 Thyrotoxicosis, unspecified without thyrotoxic crisis or storm: Secondary | ICD-10-CM | POA: Diagnosis not present

## 2020-01-11 DIAGNOSIS — Z20822 Contact with and (suspected) exposure to covid-19: Secondary | ICD-10-CM | POA: Diagnosis present

## 2020-01-11 DIAGNOSIS — I2510 Atherosclerotic heart disease of native coronary artery without angina pectoris: Secondary | ICD-10-CM | POA: Diagnosis not present

## 2020-01-11 DIAGNOSIS — R0602 Shortness of breath: Secondary | ICD-10-CM | POA: Diagnosis not present

## 2020-01-11 DIAGNOSIS — I5032 Chronic diastolic (congestive) heart failure: Secondary | ICD-10-CM | POA: Diagnosis not present

## 2020-01-11 DIAGNOSIS — E669 Obesity, unspecified: Secondary | ICD-10-CM | POA: Diagnosis present

## 2020-01-11 DIAGNOSIS — I130 Hypertensive heart and chronic kidney disease with heart failure and stage 1 through stage 4 chronic kidney disease, or unspecified chronic kidney disease: Secondary | ICD-10-CM | POA: Diagnosis not present

## 2020-01-11 DIAGNOSIS — R531 Weakness: Secondary | ICD-10-CM | POA: Diagnosis not present

## 2020-01-11 DIAGNOSIS — D649 Anemia, unspecified: Secondary | ICD-10-CM | POA: Diagnosis not present

## 2020-01-11 DIAGNOSIS — K219 Gastro-esophageal reflux disease without esophagitis: Secondary | ICD-10-CM | POA: Diagnosis not present

## 2020-01-11 DIAGNOSIS — E0591 Thyrotoxicosis, unspecified with thyrotoxic crisis or storm: Secondary | ICD-10-CM | POA: Diagnosis not present

## 2020-01-11 DIAGNOSIS — R5381 Other malaise: Secondary | ICD-10-CM | POA: Diagnosis not present

## 2020-01-11 DIAGNOSIS — I10 Essential (primary) hypertension: Secondary | ICD-10-CM | POA: Diagnosis not present

## 2020-01-11 DIAGNOSIS — R609 Edema, unspecified: Secondary | ICD-10-CM | POA: Diagnosis not present

## 2020-01-11 DIAGNOSIS — Z794 Long term (current) use of insulin: Secondary | ICD-10-CM | POA: Diagnosis not present

## 2020-01-11 DIAGNOSIS — G8929 Other chronic pain: Secondary | ICD-10-CM | POA: Diagnosis not present

## 2020-01-11 DIAGNOSIS — G629 Polyneuropathy, unspecified: Secondary | ICD-10-CM | POA: Diagnosis not present

## 2020-01-11 DIAGNOSIS — D62 Acute posthemorrhagic anemia: Secondary | ICD-10-CM | POA: Diagnosis not present

## 2020-01-11 DIAGNOSIS — D509 Iron deficiency anemia, unspecified: Secondary | ICD-10-CM | POA: Diagnosis not present

## 2020-01-11 DIAGNOSIS — K449 Diaphragmatic hernia without obstruction or gangrene: Secondary | ICD-10-CM | POA: Diagnosis not present

## 2020-01-11 DIAGNOSIS — I5033 Acute on chronic diastolic (congestive) heart failure: Secondary | ICD-10-CM | POA: Diagnosis not present

## 2020-01-11 DIAGNOSIS — I2692 Saddle embolus of pulmonary artery without acute cor pulmonale: Secondary | ICD-10-CM | POA: Diagnosis not present

## 2020-01-11 DIAGNOSIS — R578 Other shock: Secondary | ICD-10-CM | POA: Diagnosis not present

## 2020-01-11 DIAGNOSIS — R404 Transient alteration of awareness: Secondary | ICD-10-CM | POA: Diagnosis not present

## 2020-01-11 DIAGNOSIS — E1165 Type 2 diabetes mellitus with hyperglycemia: Secondary | ICD-10-CM | POA: Diagnosis not present

## 2020-01-11 DIAGNOSIS — R570 Cardiogenic shock: Secondary | ICD-10-CM | POA: Diagnosis not present

## 2020-01-11 DIAGNOSIS — I361 Nonrheumatic tricuspid (valve) insufficiency: Secondary | ICD-10-CM | POA: Diagnosis not present

## 2020-01-11 DIAGNOSIS — Z23 Encounter for immunization: Secondary | ICD-10-CM | POA: Diagnosis not present

## 2020-01-11 DIAGNOSIS — R6889 Other general symptoms and signs: Secondary | ICD-10-CM | POA: Diagnosis not present

## 2020-01-11 DIAGNOSIS — R569 Unspecified convulsions: Secondary | ICD-10-CM | POA: Diagnosis not present

## 2020-01-11 DIAGNOSIS — R195 Other fecal abnormalities: Secondary | ICD-10-CM | POA: Diagnosis not present

## 2020-01-11 DIAGNOSIS — I129 Hypertensive chronic kidney disease with stage 1 through stage 4 chronic kidney disease, or unspecified chronic kidney disease: Secondary | ICD-10-CM | POA: Diagnosis not present

## 2020-01-11 DIAGNOSIS — Z79899 Other long term (current) drug therapy: Secondary | ICD-10-CM | POA: Diagnosis not present

## 2020-01-11 DIAGNOSIS — I2609 Other pulmonary embolism with acute cor pulmonale: Secondary | ICD-10-CM | POA: Diagnosis not present

## 2020-01-11 DIAGNOSIS — I69828 Other speech and language deficits following other cerebrovascular disease: Secondary | ICD-10-CM | POA: Diagnosis not present

## 2020-01-11 DIAGNOSIS — E1122 Type 2 diabetes mellitus with diabetic chronic kidney disease: Secondary | ICD-10-CM | POA: Diagnosis not present

## 2020-01-11 DIAGNOSIS — R2681 Unsteadiness on feet: Secondary | ICD-10-CM | POA: Diagnosis not present

## 2020-01-11 DIAGNOSIS — E873 Alkalosis: Secondary | ICD-10-CM | POA: Diagnosis not present

## 2020-01-11 DIAGNOSIS — E785 Hyperlipidemia, unspecified: Secondary | ICD-10-CM | POA: Diagnosis not present

## 2020-01-11 DIAGNOSIS — I509 Heart failure, unspecified: Secondary | ICD-10-CM | POA: Diagnosis not present

## 2020-01-11 LAB — COMPREHENSIVE METABOLIC PANEL
ALT: 32 U/L (ref 0–44)
AST: 23 U/L (ref 15–41)
Albumin: 2.1 g/dL — ABNORMAL LOW (ref 3.5–5.0)
Alkaline Phosphatase: 68 U/L (ref 38–126)
Anion gap: 10 (ref 5–15)
BUN: 43 mg/dL — ABNORMAL HIGH (ref 8–23)
CO2: 26 mmol/L (ref 22–32)
Calcium: 8.6 mg/dL — ABNORMAL LOW (ref 8.9–10.3)
Chloride: 101 mmol/L (ref 98–111)
Creatinine, Ser: 1.02 mg/dL — ABNORMAL HIGH (ref 0.44–1.00)
GFR, Estimated: 54 mL/min — ABNORMAL LOW (ref >60)
Glucose, Bld: 259 mg/dL — ABNORMAL HIGH (ref 70–99)
Potassium: 4.5 mmol/L (ref 3.5–5.1)
Sodium: 137 mmol/L (ref 135–145)
Total Bilirubin: 1 mg/dL (ref 0.3–1.2)
Total Protein: 5.2 g/dL — ABNORMAL LOW (ref 6.5–8.1)

## 2020-01-11 LAB — CBC WITH DIFFERENTIAL/PLATELET
Abs Immature Granulocytes: 0.28 10*3/uL — ABNORMAL HIGH (ref 0.00–0.07)
Basophils Absolute: 0 10*3/uL (ref 0.0–0.1)
Basophils Relative: 0 %
Eosinophils Absolute: 0.1 10*3/uL (ref 0.0–0.5)
Eosinophils Relative: 0 %
HCT: 32.3 % — ABNORMAL LOW (ref 36.0–46.0)
Hemoglobin: 10.5 g/dL — ABNORMAL LOW (ref 12.0–15.0)
Immature Granulocytes: 2 %
Lymphocytes Relative: 11 %
Lymphs Abs: 1.8 10*3/uL (ref 0.7–4.0)
MCH: 25.4 pg — ABNORMAL LOW (ref 26.0–34.0)
MCHC: 32.5 g/dL (ref 30.0–36.0)
MCV: 78.2 fL — ABNORMAL LOW (ref 80.0–100.0)
Monocytes Absolute: 2.2 10*3/uL — ABNORMAL HIGH (ref 0.1–1.0)
Monocytes Relative: 13 %
Neutro Abs: 12.5 10*3/uL — ABNORMAL HIGH (ref 1.7–7.7)
Neutrophils Relative %: 74 %
Platelets: 321 10*3/uL (ref 150–400)
RBC: 4.13 MIL/uL (ref 3.87–5.11)
RDW: 16 % — ABNORMAL HIGH (ref 11.5–15.5)
WBC: 16.9 10*3/uL — ABNORMAL HIGH (ref 4.0–10.5)
nRBC: 0 % (ref 0.0–0.2)

## 2020-01-11 LAB — GLUCOSE, CAPILLARY
Glucose-Capillary: 282 mg/dL — ABNORMAL HIGH (ref 70–99)
Glucose-Capillary: 297 mg/dL — ABNORMAL HIGH (ref 70–99)
Glucose-Capillary: 246 mg/dL — ABNORMAL HIGH (ref 70–99)
Glucose-Capillary: 272 mg/dL — ABNORMAL HIGH (ref 70–99)

## 2020-01-11 LAB — T4, FREE: Free T4: 3.1 ng/dL — ABNORMAL HIGH (ref 0.61–1.12)

## 2020-01-11 LAB — MAGNESIUM: Magnesium: 2.3 mg/dL (ref 1.7–2.4)

## 2020-01-11 LAB — SARS CORONAVIRUS 2 BY RT PCR (HOSPITAL ORDER, PERFORMED IN ~~LOC~~ HOSPITAL LAB): SARS Coronavirus 2: NEGATIVE

## 2020-01-11 MED ORDER — METHIMAZOLE 10 MG PO TABS
20.0000 mg | ORAL_TABLET | Freq: Every day | ORAL | Status: DC
Start: 2020-01-11 — End: 2020-02-01

## 2020-01-11 MED ORDER — PROPRANOLOL HCL 20 MG PO TABS
20.0000 mg | ORAL_TABLET | Freq: Two times a day (BID) | ORAL | Status: DC
Start: 2020-01-11 — End: 2020-02-11

## 2020-01-11 MED ORDER — LISINOPRIL 20 MG PO TABS
20.0000 mg | ORAL_TABLET | Freq: Every day | ORAL | Status: DC
Start: 2020-01-12 — End: 2020-02-11

## 2020-01-11 NOTE — Progress Notes (Signed)
Observed mild swelling in Pt's right arm, ROM active, denies pain. Provider is notified via secure chat.

## 2020-01-11 NOTE — Progress Notes (Signed)
Upper extremity venous has been completed.   Preliminary results in CV Proc.   Blanch Media 01/11/2020 2:33 PM

## 2020-01-11 NOTE — Care Management Important Message (Signed)
Important Message  Patient Details IM Letter given to the Patient Name: BRIELYN BOSAK MRN: [id number] Date of Birth: 01-27-1933   Medicare Important Message Given:  Yes     Caren Macadam 01/11/2020, 11:13 AM

## 2020-01-11 NOTE — TOC Transition Note (Signed)
Transition of Care [facility]) - CM/SW Discharge Note   Patient Details  Name: Rebecca [name] MRN: [id number] Date of Birth: Feb 17, 1933  [facility] CM/SW Contact:  Elliot Gault, LCSW Phone Number: 01/11/2020, 2:27 PM   Clinical Narrative:     Pt stable for dc today per MD. Sherron Monday with DIL Tania Ade to update and family remains in agreement with plan for transfer to [facility] for short term rehab. Updated Lowella Bandy at [facility]. They can accept pt today. DC clinical sent electronically. RN called report. PTAR arranged.  There are no other TOC needs for dc.  Final next level of care: Skilled Nursing Facility Barriers to Discharge: Barriers Resolved   Patient Goals and CMS Choice Patient states their goals for this hospitalization and ongoing recovery are:: go to rehab CMS Medicare.gov Compare Post Acute Care list provided to:: Patient Represenative (must comment) Choice offered to / list presented to : Adult Children  Discharge Placement PASRR number recieved: 01/05/20            Patient chooses bed at: [facility] Patient to be transferred to facility by: PTAR Name of family member notified: Tania Ade Patient and family notified of of transfer: 01/11/20  Discharge Plan and Services   Discharge Planning Services: CM Consult                                 Social Determinants of Health (SDOH) Interventions     Readmission Risk Interventions No flowsheet data found.

## 2020-01-11 NOTE — Telephone Encounter (Signed)
Spoke with patient's daughter today. 

## 2020-01-11 NOTE — Progress Notes (Signed)
Verbal report given to RN at [facility].  

## 2020-01-11 NOTE — Progress Notes (Signed)
Patient transported via EMS to [facility]. Patient stable at the time of transport.

## 2020-01-11 NOTE — Discharge Summary (Signed)
Physician Discharge Summary   BRINDLE LEYBA GNO:[id number] DOB: 12-Aug-1932 DOA: 01/02/2020  PCP: Binnie Rail, MD  Admit date: 01/02/2020 Discharge date: 01/11/2020  Admitted From: home Disposition:  SNF, rehab Discharging physician: Dwyane Dee, MD  Recommendations for Outpatient Follow-up:  1. Follow up with endocrinology 2. Repeat Free T4 at follow 3. Place Zio patch once received 4. Decrease/adjust propranolol and/or lisinopril for HR/BP 5. Adjust insulin regimen if needed further 6. May need to resume lasix if shows signs of swelling/overload (not resumed at discharge)   Patient discharged to SNF in Discharge Condition: stable CODE STATUS: Full Diet recommendation:  Diet Orders (From admission, onward)    Start     Ordered   01/11/20 0000  Diet Carb Modified        01/11/20 1226   01/05/20 1034  Diet Carb Modified Fluid consistency: Thin; Room service appropriate? Yes  Diet effective now       Question Answer Comment  Diet-HS Snack? Nothing   Calorie Level Medium 1600-2000   Fluid consistency: Thin   Room service appropriate? Yes      01/05/20 1033          Hospital Course: Ms. Wallis is an 86 yo female with PMH IDDM, HTN, CKD3a, HLD, GERD, CAD, chronic LE edema (PRN hctz at home) who presented to the ER from home due to SOB and worsening swelling in her legs. She also has had some varying glucose levels it appears and glucose level in the ER noted to be (261, 308).  She underwent workup with CXR and was found to have signs of volume overload with some pulmonary edema noted.  Her trop was initially 47 and on repeat was the same, 47.  BNP elevated 332.  EKG shows sinus rhythm with PVCs, no obvious ischemic changes.   Typically the patient lives alone she says and uses a cane for assistance with ambulation but over the past several days PTA has not been able to ambulate out of bed and her son had to carry her to the bathroom she says. When asking her  why she cannot ambulate she states that her swelling is preventing her from "feeling her feet" which is making her balance off.  She was treated with IV lasix 20 mg x 1 in the ER. An in and out cath was also performed. She was admitted for further diuresis and work-up.   Ultimately her thyroid studies returned grossly abnormal with suppressed TSH and elevated T4/T3. Along with her presenting symptoms she was considered to have thyroid storm and started on treatment.  She responded well to methimazole, iodine drops, and hydrocortisone.  She was also started on propranolol and found to be in A. fib with RVR shortly after admission.  She converted spontaneously back to normal sinus rhythm.  Her beta-blocker was tapered down as she no longer required elevated doses. Her case was also discussed with endocrinology on the phone.  Her iodine drops and steroids were discontinued prior to discharge.  Methimazole dosing was also decreased at time of discharge.  She has an upcoming appointment already scheduled with endocrinology for follow-up after hospitalization. Also, she had been on treatment dose Lovenox when developing A. fib however given no prior history and likely provoked in setting of thyroid storm, she was not continued on any further anticoagulation at discharge.  This was also discussed at length with patient and her family member bedside.  We did plan to have a Zio patch placed on  the patient for further monitoring in case of paroxysmal A. fib.  This was arranged with cardiology at discharge and will be mailed to the patient for outpatient placement.  She may need further de-escalation/decrease of propranolol if develops any bradycardia.  See below for further plans by problem.    * Thyroid storm - TSH suppressed, elevated free T3/T4.   She takes women's MVI with biotin which could interfere with initial labs No palpable goiter.  No lid lag or proptosis.  She notes heat intolerance, decreased  appetite. Bilateral LE edema.  She's developed atrial fibrillation with RVR on 10/26 am, this in constellation with other symptoms concerning for thyroid storm (intermittent fever, atrial fibrillation with RVR with HR> 140, HF with LE edema, mild lethargy/malaise). - Free T4 is downtrending and responding to treatment; needs repeat outpatient - d/c hydrocortisone - continue iodine until discharge then stop - decreased methimazole to 40 mg daily. Change to methimazole 20 mg daily at discharge and follow-up with endocrinology -Heart rate also continues to come down.  Decrease propranolol to 20 mg twice daily -Thyroid ultrasound shows heterogenous thyroid with no discrete nodules  SOB (shortness of breath)-resolved as of 01/06/2020 - given worsening LE edema, elevated BNP, pulmonary edema, and indeterminate trop concern is for CHF (no prior diagnosis nor workup)  - EF 60-65%, Grade II DD - strict I&O - trended troponin; lingered in mid 40s   Acute on chronic diastolic CHF (congestive heart failure) (HCC) - see SOB workup - follow up echo: EF 60-65%, GrII DD - hold lasix for now; repeat BMP in am; LE edema has essentially resolved - BMP suggested overdiuresis; give NS'@75'  x 12 hours.  Responded well on 01/08/2020 -Monitor off fluids and Lasix for now  Diabetes [facility]) - last A1c 7.4% on 06/01/19 - repeat A1c now = 6.5% on 01/03/20 - continue on SSI and CBGs for now - d/c prandial coverage for now and watch - hold home NPH; resumed at discharge (glucose levels became elevated after steroids washed out)  CAD (coronary artery disease) - last seen by Dr. Aundra Dubin 11/01/14; she has history of PCI with 2 stents in the 1990s - calcifications noted on cxr; recent PAD workup with [facility] on 08/24/19 as well (heavily calcified LE but not felt to have PAD in LE), however still at risk for progressive CAD (see SOB workup as well) - continue asa, statin, ACEi  Atrial fibrillation with RVR (HCC)-resolved as of  01/07/2020 -No prior known history of A. fib.  This was considered provoked (and paroxysmal) in setting of thyroid storm and she has since converted back to NSR early morning of 01/06/2020 -Will arrange for ZIO Patch at discharge for 2 weeks; will be mailed to patient and need to be placed on at rehab -Discussed de-escalation of treatment dose anticoagulation with patient and daughter-in-law at bedside.  For now will de-escalate to Lovenox 40 mg daily and no anticoagulation at discharge unless patient converts back to A. fib while on Zio patch - adjusting BB as needed (per thyroid storm)  Spinal stenosis Previously able to ambulate and get around with walker, but for about the past week, unable to ambulate or turn herself in bed She describes decreased sensation to bilateral inner thighs? (L more prominent, inconsistent) - no bowel/bladder incontinence Bladder scan q6 (retaining on hospital day 1, voided without need for foley) ? If weakness changes related to edema and HF and hyperthyroidism, but MRI lumbar spine/brain obtained given concerning symptoms MRI brain/lumbar spine ->  notable for multilevel degenerative changes of lumbar spine superimposed on a congenitally narrow canal.  Findings most pronounced at L2-3 level where there is severe canal stenosis and moderate to severe R foraminal stenosis (see report).  MRI brain without acute finding. Neurosurgery c/s - pt not interested in additional imaging or surgery at this time, will follow with neurosurgery  Follow sx with treatment of above PT/OT  Microcytic anemia - noted [place] trait on patient history review - baseline Hgb ~11 g/dL - check iron labs (borderline low) and B12 (adequate)  Fever-resolved as of 01/08/2020 - may likely be related to thyroid storm however CXR on 10/26 notes subtle medial left base infiltrate concerning for possible PNA - reasonable to finish course of abx; continue Rocephin and Azithro.  Planning to treat for 5  days and stop - blood cx negative to date - UCx growing pansensitive E. coli.  Treatment for pneumonia will also cover  Chronic edema-resolved as of 01/07/2020 - B/L LE edema; seen recently by [facility] in June 2021 also - recommended for compression stockings as well; she has not purchased these yet but does want to wear them - edema seems to have worsened (on admission), see above workup  -She is also on amlodipine at home. Given chronic lower extremity edema this is on hold and would try not to resume at discharge and instead initiate different antihypertensive agent if still needed on top of ACEi - now with thyroid treatment, edema has resolved  Physical deconditioning - patient grossly weak in bed, unable to sit up without much assistance; also not ambulating anymore recently at home (may improve some with CHF workup/tx) - see spinal stenosis and thyroid storm   Chronic kidney disease, stage 3a (HCC) - baseline creat ~1.1 - 1.2 - currently at baseline on admission - avoid nephrotoxic agents as able   Essential hypertension - hold amlodipine due to LE edema (edema now resolved, likely was in setting of uncontrolled thyroid) - continue ACEi - will add further agents if needed - holding imdur as well for now   Principal Diagnosis: Thyroid storm  Discharge Diagnoses: Active Hospital Problems   Diagnosis Date Noted  . Thyroid storm 01/06/2020    Priority: High  . Acute on chronic diastolic CHF (congestive heart failure) ([place]) 01/03/2020    Priority: Medium  . Diabetes ([place]) 04/01/2013    Priority: Medium  . CAD (coronary artery disease) 11/26/2006    Priority: Medium  . Spinal stenosis 01/06/2020    Priority: Low  . Microcytic anemia 01/03/2020    Priority: Low  . Chronic kidney disease, stage 3a ([place]) 01/03/2020  . Physical deconditioning 01/03/2020  . Essential hypertension 05/27/2006    Resolved Hospital Problems   Diagnosis Date Noted Date Resolved  . SOB (shortness of  breath) 01/03/2020 01/06/2020    Priority: High  . Atrial fibrillation with RVR ([place]) 01/07/2020 01/07/2020    Priority: Medium  . Fever 01/06/2020 01/08/2020    Priority: Low  . Chronic edema 01/03/2020 01/07/2020    Priority: Low    Discharge Instructions    Diet Carb Modified   Complete by: As directed    Increase activity slowly   Complete by: As directed      Allergies as of 01/11/2020      Reactions   Codeine    "Makes me drunk"      Medication List    STOP taking these medications   amLODipine 10 MG tablet Commonly known as: NORVASC  hydrochlorothiazide 12.5 MG capsule Commonly known as: MICROZIDE   naproxen 500 MG tablet Commonly known as: NAPROSYN   quinapril 40 MG tablet Commonly known as: ACCUPRIL     TAKE these medications   Alcohol Swabs Pads Use as needed to clean puncture site.   Aspercreme 10 % Lotn Generic drug: Trolamine Salicylate Use twice daily as needed for right knee pain.   aspirin 81 MG tablet Take 81 mg by mouth daily.   atorvastatin 10 MG tablet Commonly known as: LIPITOR TAKE 1 TABLET BY MOUTH  DAILY AT 6 PM   BD Veo Insulin Syringe U/F 31G X 15/64" 0.3 ML Misc Generic drug: Insulin Syringe-Needle U-100 USE TWICE DAILY TO INJECT  INSULIN   brimonidine 0.2 % ophthalmic solution Commonly known as: ALPHAGAN Place 1 drop into the right eye 2 (two) times daily.   calcium carbonate 600 MG Tabs tablet Commonly known as: OS-CAL Take 600 mg by mouth daily.   Cod Liver Oil 1000 MG Caps Take 1,000 mg by mouth daily. During the winter   DORZOLAMIDE HCL-TIMOLOL MAL OP Place 1 drop into the right eye 2 (two) times daily.   gabapentin 100 MG capsule Commonly known as: NEURONTIN TAKE 3 CAPSULES BY MOUTH AT BEDTIME What changed:   how much to take  when to take this  additional instructions   HumuLIN 70/30 (70-30) 100 UNIT/ML injection Generic drug: insulin NPH-regular Human Inject 15 units in the morning and 7-10 units  in the evening   isosorbide mononitrate 30 MG 24 hr tablet Commonly known as: IMDUR TAKE 1 TABLET BY MOUTH  DAILY   latanoprost 0.005 % ophthalmic solution Commonly known as: XALATAN 1 drop at bedtime.   lisinopril 20 MG tablet Commonly known as: ZESTRIL Take 1 tablet (20 mg total) by mouth daily. Start taking on: January 12, 2020   methimazole 10 MG tablet Commonly known as: TAPAZOLE Take 2 tablets (20 mg total) by mouth daily.   multivitamin tablet Take 1 tablet by mouth daily. Prn   nitroGLYCERIN 0.4 MG SL tablet Commonly known as: Nitrostat DISSOLVE ONE TABLET UNDER TONGUE AS DIRECTED FOR CHEST PAIN   omeprazole 20 MG capsule Commonly known as: PRILOSEC TAKE 1 CAPSULE BY MOUTH IN  THE MORNING   ONE TOUCH ULTRA 2 w/Device Kit Use to check blood sugars twice a day Dx U38.3   OneTouch Delica Lancets 33O Misc Use to check blood sugars 2 times daily. E11.9   OneTouch Ultra test strip Generic drug: glucose blood USE 1 STRIP TO CHECK GLUCOSE TWICE DAILY   propranolol 20 MG tablet Commonly known as: INDERAL Take 1 tablet (20 mg total) by mouth 2 (two) times daily.   vitamin C 500 MG tablet Commonly known as: ASCORBIC ACID Take 500 mg by mouth 2 (two) times daily.       Contact information for after-discharge care    Destination    [facility] Preferred SNF .   Service: Skilled Nursing Contact information: [place] [phone]                 Allergies  Allergen Reactions  . Codeine     "Makes me drunk"    Consultations: none  Discharge Exam: BP (!) 143/51 (BP Location: Left Arm)   Pulse 72   Temp 99 F (37.2 C) (Oral)   Resp 18   Ht '5\' 3"'  (1.6 m)   Wt 86.5 kg   SpO2 96%  BMI 33.78 kg/m  General appearance: Pleasant woman resting in bed in no distress Head: Normocephalic, without obvious abnormality, atraumatic Eyes: EOMI Lungs: clear to auscultation bilaterally Heart:  regular rate and rhythm and S1, S2 normal Abdomen: normal findings: bowel sounds normal and soft, non-tender Extremities: LE edema resolved; they remain tender to palpation which is baseline for her Skin: mobility and turgor normal Neurologic: symmetric weakness in LE but improved  The results of significant diagnostics from this hospitalization (including imaging, microbiology, ancillary and laboratory) are listed below for reference.   Microbiology: Recent Results (from the past 240 hour(s))  Respiratory Panel by RT PCR (Flu A&B, Covid) - Nasopharyngeal Swab     Status: None   Collection Time: 01/02/20 10:52 PM   Specimen: Nasopharyngeal Swab  Result Value Ref Range Status   SARS Coronavirus 2 by RT PCR NEGATIVE NEGATIVE Final    Comment: (NOTE) SARS-CoV-2 target nucleic acids are NOT DETECTED.  The SARS-CoV-2 RNA is generally detectable in upper respiratoy specimens during the acute phase of infection. The lowest concentration of SARS-CoV-2 viral copies this assay can detect is 131 copies/mL. A negative result does not preclude SARS-Cov-2 infection and should not be used as the sole basis for treatment or other patient management decisions. A negative result may occur with  improper specimen collection/handling, submission of specimen other than nasopharyngeal swab, presence of viral mutation(s) within the areas targeted by this assay, and inadequate number of viral copies (<131 copies/mL). A negative result must be combined with clinical observations, patient history, and epidemiological information. The expected result is Negative.  Fact Sheet for Patients:  PinkCheek.be  Fact Sheet for Healthcare Providers:  GravelBags.it  This test is no t yet approved or cleared by the [place] FDA and  has been authorized for detection and/or diagnosis of SARS-CoV-2 by FDA under an Emergency Use Authorization (EUA). This EUA  will remain  in effect (meaning this test can be used) for the duration of the COVID-19 declaration under Section 564(b)(1) of the Act, 21 U.S.C. section 360bbb-3(b)(1), unless the authorization is terminated or revoked sooner.     Influenza A by PCR NEGATIVE NEGATIVE Final   Influenza B by PCR NEGATIVE NEGATIVE Final    Comment: (NOTE) The Xpert Xpress SARS-CoV-2/FLU/RSV assay is intended as an aid in  the diagnosis of influenza from Nasopharyngeal swab specimens and  should not be used as a sole basis for treatment. Nasal washings and  aspirates are unacceptable for Xpert Xpress SARS-CoV-2/FLU/RSV  testing.  Fact Sheet for Patients: PinkCheek.be  Fact Sheet for Healthcare Providers: GravelBags.it  This test is not yet approved or cleared by the [place] FDA and  has been authorized for detection and/or diagnosis of SARS-CoV-2 by  FDA under an Emergency Use Authorization (EUA). This EUA will remain  in effect (meaning this test can be used) for the duration of the  Covid-19 declaration under Section 564(b)(1) of the Act, 21  U.S.C. section 360bbb-3(b)(1), unless the authorization is  terminated or revoked. Performed at [facility], [place] [address] [place], [place] [address]   Culture, blood (routine x 2)     Status: None   Collection Time: 01/03/20  6:27 PM   Specimen: BLOOD RIGHT WRIST  Result Value Ref Range Status   Specimen Description   Final    BLOOD RIGHT WRIST Performed at [facility], [address], [place], [place] [address]    Special Requests   Final    BOTTLES  DRAWN AEROBIC AND ANAEROBIC Blood Culture adequate volume Performed at [place] [address] [place], [place] [address]    Culture   Final    NO GROWTH 5 DAYS Performed at [facility], [place] [address] [place], [place] [address]    Report Status 01/09/2020 FINAL  Final    Culture, blood (routine x 2)     Status: None   Collection Time: 01/03/20  6:42 PM   Specimen: BLOOD  Result Value Ref Range Status   Specimen Description   Final    BLOOD LEFT ANTECUBITAL Performed at [place] [address] [place], [place] [address]    Special Requests   Final    BOTTLES DRAWN AEROBIC AND ANAEROBIC Blood Culture adequate volume Performed at [place] [address] [place], [place] [address]    Culture   Final    NO GROWTH 5 DAYS Performed at [facility], [place] [address], [place], [place] [address]    Report Status 01/09/2020 FINAL  Final  Culture, Urine     Status: Abnormal   Collection Time: 01/04/20  8:02 AM   Specimen: Urine, Random  Result Value Ref Range Status   Specimen Description   Final    URINE, RANDOM Performed at [place] [address] [place], [place] [address]    Special Requests   Final    NONE Performed at [facility], [place] [address], [place], [place] [address]    Culture >=100,000 COLONIES/mL ESCHERICHIA COLI (A)  Final   Report Status 01/07/2020 FINAL  Final   Organism ID, Bacteria ESCHERICHIA COLI (A)  Final      Susceptibility   Escherichia coli - MIC*    AMPICILLIN 4 SENSITIVE Sensitive     CEFAZOLIN <=4 SENSITIVE Sensitive     CEFTRIAXONE <=0.25 SENSITIVE Sensitive     CIPROFLOXACIN <=0.25 SENSITIVE Sensitive     GENTAMICIN <=1 SENSITIVE Sensitive     IMIPENEM 1 SENSITIVE Sensitive     NITROFURANTOIN <=16 SENSITIVE Sensitive     TRIMETH/SULFA <=20 SENSITIVE Sensitive     AMPICILLIN/SULBACTAM <=2 SENSITIVE Sensitive     PIP/TAZO <=4 SENSITIVE Sensitive     * >=100,000 COLONIES/mL ESCHERICHIA COLI  SARS Coronavirus 2 by RT PCR (hospital order, performed in [place] hospital lab) Nasopharyngeal Nasopharyngeal Swab     Status: None   Collection Time: 01/11/20  9:17 AM   Specimen: Nasopharyngeal Swab  Result Value Ref Range Status   SARS  Coronavirus 2 NEGATIVE NEGATIVE Final    Comment: (NOTE) SARS-CoV-2 target nucleic acids are NOT DETECTED.  The SARS-CoV-2 RNA is generally detectable in upper and lower respiratory specimens during the acute phase of infection. The lowest concentration of SARS-CoV-2 viral copies this assay can detect is 250 copies / mL. A negative result does not preclude SARS-CoV-2 infection and should not be used as the sole basis for treatment or other patient management decisions.  A negative result may occur with improper specimen collection / handling, submission of specimen other than nasopharyngeal swab, presence of viral mutation(s) within the areas targeted by this assay, and inadequate number of viral copies (<250 copies / mL). A negative result must be combined with clinical observations, patient history, and epidemiological information.  Fact Sheet for Patients:   StrictlyIdeas.no  Fact Sheet for Healthcare Providers: BankingDealers.co.za  This test is not yet approved or  cleared by the [place] FDA and has  been authorized for detection and/or diagnosis of SARS-CoV-2 by FDA under an Emergency Use Authorization (EUA).  This EUA will remain in effect (meaning this test can be used) for the duration of the COVID-19 declaration under Section 564(b)(1) of the Act, 21 U.S.C. section 360bbb-3(b)(1), unless the authorization is terminated or revoked sooner.  Performed at [facility], [place] [address] [place], [place] [address]      Labs: BNP (last 3 results) Recent Labs    01/02/20 2251  BNP 300.5*   Basic Metabolic Panel: Recent Labs  Lab 01/05/20 0505 01/06/20 0443 01/07/20 0410 01/08/20 0421 01/11/20 0443  NA 135 134* 135 139 137  K 4.2 4.0 3.6 3.2* 4.5  CL 98 97* 97* 102 101  CO2 '26 24 25 26 26  ' GLUCOSE 338* 299* 231* 88 259*  BUN 48* 62* 85* 79* 43*  CREATININE 0.92 0.97 1.26* 1.10* 1.02*    CALCIUM 9.1 9.0 8.9 8.8* 8.6*  MG 1.8 2.0 2.2 2.3 2.3  PHOS 3.8 4.5  --   --   --    Liver Function Tests: Recent Labs  Lab 01/05/20 0505 01/06/20 0443 01/11/20 0443  AST '18 16 23  ' ALT 23 18 32  ALKPHOS 70 63 68  BILITOT 0.7 0.4 1.0  PROT 6.4* 5.8* 5.2*  ALBUMIN 2.5* 2.2* 2.1*   No results for input(s): LIPASE, AMYLASE in the last 168 hours. No results for input(s): AMMONIA in the last 168 hours. CBC: Recent Labs  Lab 01/05/20 0505 01/06/20 0443 01/07/20 0410 01/08/20 0421 01/11/20 0443  WBC 9.6 10.0 9.8 10.9* 16.9*  NEUTROABS 8.4* 8.8* 8.3* 9.1* 12.5*  HGB 10.5* 10.3* 10.5* 11.1* 10.5*  HCT 31.4* 30.7* 30.7* 33.1* 32.3*  MCV 77.3* 76.2* 75.6* 76.1* 78.2*  PLT 235 257 295 334 321   Cardiac Enzymes: No results for input(s): CKTOTAL, CKMB, CKMBINDEX, TROPONINI in the last 168 hours. BNP: Invalid input(s): POCBNP CBG: Recent Labs  Lab 01/10/20 1152 01/10/20 1719 01/10/20 1954 01/11/20 0740 01/11/20 1114  GLUCAP 218* 222* 186* 282* 297*   D-Dimer No results for input(s): DDIMER in the last 72 hours. Hgb A1c No results for input(s): HGBA1C in the last 72 hours. Lipid Profile No results for input(s): CHOL, HDL, LDLCALC, TRIG, CHOLHDL, LDLDIRECT in the last 72 hours. Thyroid function studies No results for input(s): TSH, T4TOTAL, T3FREE, THYROIDAB in the last 72 hours.  Invalid input(s): FREET3 Anemia work up No results for input(s): VITAMINB12, FOLATE, FERRITIN, TIBC, IRON, RETICCTPCT in the last 72 hours. Urinalysis    Component Value Date/Time   COLORURINE YELLOW 01/02/2020 2251   APPEARANCEUR CLEAR 01/02/2020 2251   LABSPEC 1.011 01/02/2020 2251   PHURINE 5.0 01/02/2020 2251   GLUCOSEU 50 (A) 01/02/2020 2251   HGBUR SMALL (A) 01/02/2020 2251   BILIRUBINUR NEGATIVE 01/02/2020 2251   KETONESUR NEGATIVE 01/02/2020 2251   PROTEINUR NEGATIVE 01/02/2020 2251   NITRITE POSITIVE (A) 01/02/2020 2251   LEUKOCYTESUR NEGATIVE 01/02/2020 2251   Sepsis  Labs Invalid input(s): PROCALCITONIN,  WBC,  LACTICIDVEN Microbiology Recent Results (from the past 240 hour(s))  Respiratory Panel by RT PCR (Flu A&B, Covid) - Nasopharyngeal Swab     Status: None   Collection Time: 01/02/20 10:52 PM   Specimen: Nasopharyngeal Swab  Result Value Ref Range Status   SARS Coronavirus 2 by RT PCR NEGATIVE NEGATIVE Final    Comment: (NOTE) SARS-CoV-2 target nucleic acids are NOT DETECTED.  The SARS-CoV-2 RNA is generally detectable in upper respiratoy specimens during  the acute phase of infection. The lowest concentration of SARS-CoV-2 viral copies this assay can detect is 131 copies/mL. A negative result does not preclude SARS-Cov-2 infection and should not be used as the sole basis for treatment or other patient management decisions. A negative result may occur with  improper specimen collection/handling, submission of specimen other than nasopharyngeal swab, presence of viral mutation(s) within the areas targeted by this assay, and inadequate number of viral copies (<131 copies/mL). A negative result must be combined with clinical observations, patient history, and epidemiological information. The expected result is Negative.  Fact Sheet for Patients:  PinkCheek.be  Fact Sheet for Healthcare Providers:  GravelBags.it  This test is no t yet approved or cleared by the [place] FDA and  has been authorized for detection and/or diagnosis of SARS-CoV-2 by FDA under an Emergency Use Authorization (EUA). This EUA will remain  in effect (meaning this test can be used) for the duration of the COVID-19 declaration under Section 564(b)(1) of the Act, 21 U.S.C. section 360bbb-3(b)(1), unless the authorization is terminated or revoked sooner.     Influenza A by PCR NEGATIVE NEGATIVE Final   Influenza B by PCR NEGATIVE NEGATIVE Final    Comment: (NOTE) The Xpert Xpress SARS-CoV-2/FLU/RSV assay  is intended as an aid in  the diagnosis of influenza from Nasopharyngeal swab specimens and  should not be used as a sole basis for treatment. Nasal washings and  aspirates are unacceptable for Xpert Xpress SARS-CoV-2/FLU/RSV  testing.  Fact Sheet for Patients: PinkCheek.be  Fact Sheet for Healthcare Providers: GravelBags.it  This test is not yet approved or cleared by the [place] FDA and  has been authorized for detection and/or diagnosis of SARS-CoV-2 by  FDA under an Emergency Use Authorization (EUA). This EUA will remain  in effect (meaning this test can be used) for the duration of the  Covid-19 declaration under Section 564(b)(1) of the Act, 21  U.S.C. section 360bbb-3(b)(1), unless the authorization is  terminated or revoked. Performed at [facility], [place] [address] [place], [place] [address]   Culture, blood (routine x 2)     Status: None   Collection Time: 01/03/20  6:27 PM   Specimen: BLOOD RIGHT WRIST  Result Value Ref Range Status   Specimen Description   Final    BLOOD RIGHT WRIST Performed at [facility], [address] [employer] [place], [place] [address]    Special Requests   Final    BOTTLES DRAWN AEROBIC AND ANAEROBIC Blood Culture adequate volume Performed at [place] [address], [place], [place] [address]    Culture   Final    NO GROWTH 5 DAYS Performed at [facility], [place] [address], [place], [place] [address]    Report Status 01/09/2020 FINAL  Final  Culture, blood (routine x 2)     Status: None   Collection Time: 01/03/20  6:42 PM   Specimen: BLOOD  Result Value Ref Range Status   Specimen Description   Final    BLOOD LEFT ANTECUBITAL Performed at [place] [address] [place], [place] [address]    Special Requests   Final    BOTTLES DRAWN AEROBIC AND ANAEROBIC Blood Culture adequate volume Performed at  [place] [address], [place], [place] [address]    Culture   Final    NO GROWTH 5 DAYS Performed at [facility], [place] [address] [place], [place]  [address]    Report Status 01/09/2020 FINAL  Final  Culture, Urine     Status: Abnormal   Collection Time: 01/04/20  8:02 AM   Specimen: Urine, Random  Result Value Ref Range Status   Specimen Description   Final    URINE, RANDOM Performed at [place] [address] [place], [place] [address]    Special Requests   Final    NONE Performed at [facility], [place] [address], [place], [place] [address]    Culture >=100,000 COLONIES/mL ESCHERICHIA COLI (A)  Final   Report Status 01/07/2020 FINAL  Final   Organism ID, Bacteria ESCHERICHIA COLI (A)  Final      Susceptibility   Escherichia coli - MIC*    AMPICILLIN 4 SENSITIVE Sensitive     CEFAZOLIN <=4 SENSITIVE Sensitive     CEFTRIAXONE <=0.25 SENSITIVE Sensitive     CIPROFLOXACIN <=0.25 SENSITIVE Sensitive     GENTAMICIN <=1 SENSITIVE Sensitive     IMIPENEM 1 SENSITIVE Sensitive     NITROFURANTOIN <=16 SENSITIVE Sensitive     TRIMETH/SULFA <=20 SENSITIVE Sensitive     AMPICILLIN/SULBACTAM <=2 SENSITIVE Sensitive     PIP/TAZO <=4 SENSITIVE Sensitive     * >=100,000 COLONIES/mL ESCHERICHIA COLI  SARS Coronavirus 2 by RT PCR (hospital order, performed in [facility] hospital lab) Nasopharyngeal Nasopharyngeal Swab     Status: None   Collection Time: 01/11/20  9:17 AM   Specimen: Nasopharyngeal Swab  Result Value Ref Range Status   SARS Coronavirus 2 NEGATIVE NEGATIVE Final    Comment: (NOTE) SARS-CoV-2 target nucleic acids are NOT DETECTED.  The SARS-CoV-2 RNA is generally detectable in upper and lower respiratory specimens during the acute phase of infection. The lowest concentration of SARS-CoV-2 viral copies this assay can detect is 250 copies / mL. A negative result does not preclude SARS-CoV-2 infection and should  not be used as the sole basis for treatment or other patient management decisions.  A negative result may occur with improper specimen collection / handling, submission of specimen other than nasopharyngeal swab, presence of viral mutation(s) within the areas targeted by this assay, and inadequate number of viral copies (<250 copies / mL). A negative result must be combined with clinical observations, patient history, and epidemiological information.  Fact Sheet for Patients:   StrictlyIdeas.no  Fact Sheet for Healthcare Providers: BankingDealers.co.za  This test is not yet approved or  cleared by the [place] FDA and has been authorized for detection and/or diagnosis of SARS-CoV-2 by FDA under an Emergency Use Authorization (EUA).  This EUA will remain in effect (meaning this test can be used) for the duration of the COVID-19 declaration under Section 564(b)(1) of the Act, 21 U.S.C. section 360bbb-3(b)(1), unless the authorization is terminated or revoked sooner.  Performed at [facility], [place] [address], [place], [place] [address]     Procedures/Studies: MR BRAIN WO CONTRAST  Result Date: 01/03/2020 CLINICAL DATA:  Acute stroke suspected.  Neuro deficit. EXAM: MRI HEAD WITHOUT CONTRAST TECHNIQUE: Multiplanar, multiecho pulse sequences of the brain and surrounding structures were obtained without intravenous contrast. COMPARISON:  None. FINDINGS: Brain: No acute infarction, hemorrhage, hydrocephalus, extra-axial collection or mass lesion. Generalized cortical atrophy. Mild chronic small vessel ischemia in the periventricular white matter for age. Remote lacunar infarct at the right thalamus. Small remote right cerebellar infarct. Vascular: Preserved flow voids Skull and upper cervical spine: Normal marrow signal Sinuses/Orbits: Bilateral cataract resection IMPRESSION: 1. No acute finding,  including infarct. 2.  Cortical atrophy and chronic small vessel disease. Electronically Signed   By: Monte Fantasia M.D.   On: 01/03/2020 12:02   MR LUMBAR SPINE WO CONTRAST  Result Date: 01/03/2020 CLINICAL DATA:  Low back pain with bilateral leg weakness EXAM: MRI LUMBAR SPINE WITHOUT CONTRAST TECHNIQUE: Multiplanar, multisequence MR imaging of the lumbar spine was performed. No intravenous contrast was administered. COMPARISON:  X-ray 11/06/2018 FINDINGS: Segmentation: Suspect transitional lumbosacral anatomy with partial sacralization of the L5 segment. Grade 1 anterolisthesis is present at the L4-5 level, in keeping with numbering convention established on previous radiographs. Alignment:  5 mm grade 1 anterolisthesis L4 on L5. Vertebrae: No fracture, evidence of discitis, or bone lesion. Mild diffuse intrinsic canal narrowing on the basis of congenitally short pedicles. Conus medullaris and cauda equina: Conus extends to the T12 level. Conus and cauda equina appear within normal limits. Paraspinal and other soft tissues: Cortically based T2 hyperintense lesionswithin the left kidney, incompletely characterized, but most likely represent cysts. Moderate distention of the urinary bladder is partially visualized. Disc levels: T11-T12: Sagittal sequences only. Moderate diffuse disc bulge with mild canal stenosis, moderate left and mild-to-moderate right foraminal stenosis. T12-L1: Sagittal sequences only. Mild circumferential disc bulge without evidence of foraminal or canal stenosis. L1-L2: Mild circumferential disc bulge with bilateral facet arthropathy and ligamentum flavum buckling resulting in mild-to-moderate canal stenosis with mild bilateral foraminal stenosis. L2-L3: Diffuse disc bulge with advanced bilateral facet arthropathy and ligamentum flavum buckling resulting in severe canal stenosis with moderate to severe right foraminal stenosis. L3-L4: Postsurgical changes to the left lamina. Mild diffuse disc bulge with  bilateral facet arthropathy and ligamentum flavum buckling resulting in mild canal stenosis with moderate to severe right and mild-to-moderate left foraminal stenosis. L4-L5: Postsurgical changes to the left lamina. Disc uncovering with mild diffuse disc bulge. Bilateral facet arthropathy. No canal stenosis. Moderate bilateral foraminal stenosis, left worse than right. L5-S1: Mild posterior osteophytic spurring without foraminal or canal stenosis. IMPRESSION: 1. Multilevel degenerative changes of the lumbar spine superimposed on a congenitally narrow canal. Findings are most pronounced at the L2-3 level where there is severe canal stenosis and moderate-to-severe right foraminal stenosis. 2. Additional levels of mild-to-moderate canal stenosis, as above. 3. Moderate-to-severe right foraminal stenosis at L3-4. 4. Moderate bilateral foraminal stenosis at L4-5. 5. Moderate distention of the urinary bladder. Correlate for urinary retention. Electronically Signed   By: Davina Poke D.O.   On: 01/03/2020 11:49   DG CHEST PORT 1 VIEW  Result Date: 01/05/2020 CLINICAL DATA:  Hypertension and lower extremity edema. Shortness of breath. EXAM: PORTABLE CHEST 1 VIEW COMPARISON:  January 04, 2020 FINDINGS: There is bibasilar atelectasis. There is subtle consolidation medial left base. Lungs elsewhere are clear. Heart is upper normal in size with pulmonary vascularity normal. No adenopathy. There is aortic atherosclerosis. No bone lesions. IMPRESSION: Subtle consolidation medial left base, likely developing pneumonia. There is also bibasilar atelectasis. Lungs elsewhere clear. Stable cardiac prominence. No adenopathy evident. Aortic Atherosclerosis (ICD10-I70.0). Electronically Signed   By: Lowella Grip III M.D.   On: 01/05/2020 08:47   DG CHEST PORT 1 VIEW  Result Date: 01/04/2020 CLINICAL DATA:  86 year old female with shortness of breath EXAM: PORTABLE CHEST 1 VIEW COMPARISON:  Prior chest x-ray 01/02/2020  FINDINGS: Increasing streaky airspace opacities in the left lower lobe partially obscuring the left hemidiaphragm. Stable cardiomegaly. Atherosclerotic calcifications again noted in the transverse aorta. No pulmonary edema, large pleural effusion or pneumothorax. Minimal atelectasis versus scarring in the  right lung base, unchanged. No acute osseous abnormality. IMPRESSION: 1. Increasing streaky airspace opacities in the left lower lobe which may reflect atelectasis and/or infiltrate. 2. Stable cardiomegaly. 3. Aortic calcifications. Electronically Signed   By: Jacqulynn Cadet M.D.   On: 01/04/2020 09:15   DG Chest Port 1 View  Result Date: 01/02/2020 CLINICAL DATA:  Weakness EXAM: PORTABLE CHEST 1 VIEW COMPARISON:  None. FINDINGS: The heart size and mediastinal contours are mildly enlarged. Aortic knob calcifications are seen. There is prominence of the central pulmonary vasculature. Both lungs are clear. The visualized skeletal structures are unremarkable. IMPRESSION: Mild cardiomegaly and pulmonary vascular congestion. Electronically Signed   By: Prudencio Pair M.D.   On: 01/02/2020 23:18   ECHOCARDIOGRAM COMPLETE  Result Date: 01/03/2020    ECHOCARDIOGRAM REPORT   Patient Name:   TRINDA HARLACHER [facility] Date of Exam: 01/03/2020 Medical Rec #:  [id number]            Height:       63.0 in Accession #:    [id number]           Weight:       192.2 lb Date of Birth:  Jan 11, 1933            BSA:          1.901 m Patient Age:    86 years             BP:           140/60 mmHg Patient Gender: F                    HR:           105 bpm. Exam Location:  Inpatient Procedure: 2D Echo, Color Doppler and Cardiac Doppler Indications:    I50.9* Heart failure (unspecified)  History:        Patient has no prior history of Echocardiogram examinations.                 CHF, CAD; Risk Factors:Hypertension, Diabetes, Dyslipidemia and                 Sickle Cell.  Sonographer:    Raquel Sarna Senior RDCS Referring Phys: [place]   Sonographer Comments: Technically difficult due to poor echo windows; patient unable to tolerate any probe pressure. IMPRESSIONS  1. Left ventricular ejection fraction, by estimation, is 60 to 65%. The left ventricle has normal function. The left ventricle has no regional wall motion abnormalities. There is mild asymmetric left ventricular hypertrophy of the basal-septal segment. Left ventricular diastolic parameters are consistent with Grade II diastolic dysfunction (pseudonormalization). Elevated left atrial pressure.  2. Right ventricular systolic function is normal. The right ventricular size is normal.  3. A small pericardial effusion is present. The pericardial effusion is circumferential. There is no evidence of cardiac tamponade.  4. The mitral valve is grossly normal. Mild mitral valve regurgitation. No evidence of mitral stenosis.  5. The aortic valve is tricuspid. Aortic valve regurgitation is not visualized. No aortic stenosis is present. FINDINGS  Left Ventricle: Left ventricular ejection fraction, by estimation, is 60 to 65%. The left ventricle has normal function. The left ventricle has no regional wall motion abnormalities. The left ventricular internal cavity size was normal in size. There is  mild asymmetric left ventricular hypertrophy of the basal-septal segment. Left ventricular diastolic parameters are consistent with Grade II diastolic dysfunction (pseudonormalization). Elevated left atrial pressure. Right Ventricle: The right ventricular size is  normal. No increase in right ventricular wall thickness. Right ventricular systolic function is normal. Left Atrium: Left atrial size was normal in size. Right Atrium: Right atrial size was normal in size. Pericardium: A small pericardial effusion is present. The pericardial effusion is circumferential. There is no evidence of cardiac tamponade. Mitral Valve: The mitral valve is grossly normal. Mild mitral valve regurgitation. No evidence of mitral  valve stenosis. Tricuspid Valve: The tricuspid valve is grossly normal. Tricuspid valve regurgitation is mild . No evidence of tricuspid stenosis. Aortic Valve: The aortic valve is tricuspid. Aortic valve regurgitation is not visualized. No aortic stenosis is present. Pulmonic Valve: The pulmonic valve was grossly normal. Pulmonic valve regurgitation is trivial. No evidence of pulmonic stenosis. Aorta: The aortic root and ascending aorta are structurally normal, with no evidence of dilitation. Venous: The inferior vena cava was not well visualized. IAS/Shunts: The atrial septum is grossly normal.  LEFT VENTRICLE PLAX 2D LVIDd:         4.00 cm  Diastology LVIDs:         3.00 cm  LV e' medial:    4.68 cm/s LV PW:         0.90 cm  LV E/e' medial:  15.9 LV IVS:        1.20 cm  LV e' lateral:   4.24 cm/s LVOT diam:     2.00 cm  LV E/e' lateral: 17.6 LV SV:         51 LV SV Index:   27 LVOT Area:     3.14 cm  RIGHT VENTRICLE RV S prime:     23.40 cm/s TAPSE (M-mode): 2.7 cm LEFT ATRIUM           Index       RIGHT ATRIUM           Index LA diam:      2.10 cm 1.10 cm/m  RA Area:     13.50 cm LA Vol (A2C): 23.8 ml 12.52 ml/m RA Volume:   29.90 ml  15.73 ml/m LA Vol (A4C): 48.8 ml 25.67 ml/m  AORTIC VALVE LVOT Vmax:   87.50 cm/s LVOT Vmean:  67.100 cm/s LVOT VTI:    0.162 m  AORTA Ao Root diam: 3.30 cm Ao Asc diam:  3.20 cm MITRAL VALVE                TRICUSPID VALVE MV Area (PHT): 4.96 cm     TR Peak grad:   42.0 mmHg MV Decel Time: 153 msec     TR Vmax:        324.00 cm/s MV E velocity: 74.60 cm/s MV A velocity: 105.00 cm/s  SHUNTS MV E/A ratio:  0.71         Systemic VTI:  0.16 m                             Systemic Diam: 2.00 cm Eleonore Chiquito MD Electronically signed by Eleonore Chiquito MD Signature Date/Time: 01/03/2020/10:31:23 AM    Final    US THYROID  Result Date: 01/08/2020 CLINICAL DATA:  Thyroid storm. EXAM: THYROID ULTRASOUND TECHNIQUE: Ultrasound examination of the thyroid gland and adjacent soft  tissues was performed. COMPARISON:  06/20/2006 FINDINGS: Parenchymal Echotexture: Markedly heterogenous Isthmus: 1.2 Right lobe: 3.4 x 1.6 x 2.2 cm Left lobe: 4.8 x 2.2 x 2 cm _________________________________________________________ Estimated total number of nodules >/= 1 cm: 0 Number of spongiform nodules >/=  2 cm not described below (TR1): 0 Number of mixed cystic and solid nodules >/= 1.5 cm not described below (TR2): 0 _________________________________________________________ No discrete nodules are seen within the thyroid gland. IMPRESSION: Markedly heterogeneous thyroid gland without evidence for distinct thyroid nodule. The above is in keeping with the ACR TI-RADS recommendations - J Am Coll Radiol 2017;14:587-595. Electronically Signed   By: Constance Holster M.D.   On: 01/08/2020 17:40   VAS [place] LOWER EXTREMITY VENOUS (DVT)  Result Date: 01/04/2020  Lower Venous DVTStudy Indications: Pain, and Swelling. Other Indications: Recent immobility. Anticoagulation: Lovenox. Limitations: Tissue properties, patient intolerant to probe pressure and unable to reposition. Comparison Study: No prior studies. Performing Technologist: Darlin Coco  Examination Guidelines: A complete evaluation includes B-mode imaging, spectral Doppler, color Doppler, and power Doppler as needed of all accessible portions of each vessel. Bilateral testing is considered an integral part of a complete examination. Limited examinations for reoccurring indications may be performed as noted. The reflux portion of the exam is performed with the patient in reverse Trendelenburg.  +---------+---------------+---------+-----------+----------+-------------------+ RIGHT    CompressibilityPhasicitySpontaneityPropertiesThrombus Aging      +---------+---------------+---------+-----------+----------+-------------------+ CFV      Full           Yes      Yes                                       +---------+---------------+---------+-----------+----------+-------------------+ SFJ      Full                                                             +---------+---------------+---------+-----------+----------+-------------------+ FV Prox  Full                                                             +---------+---------------+---------+-----------+----------+-------------------+ FV Mid   Full                                                             +---------+---------------+---------+-----------+----------+-------------------+ FV DistalFull                                                             +---------+---------------+---------+-----------+----------+-------------------+ PFV      Full                                                             +---------+---------------+---------+-----------+----------+-------------------+ POP  Yes      Yes                                      +---------+---------------+---------+-----------+----------+-------------------+ PTV                                                   Patent by color-                                                          some segments not                                                         well visualized     +---------+---------------+---------+-----------+----------+-------------------+ PERO                                                  Patent by color-                                                          some segments not                                                         well visualized     +---------+---------------+---------+-----------+----------+-------------------+   +---------+---------------+---------+-----------+----------+--------------+ LEFT     CompressibilityPhasicitySpontaneityPropertiesThrombus Aging +---------+---------------+---------+-----------+----------+--------------+ CFV      Full            Yes      Yes                                 +---------+---------------+---------+-----------+----------+--------------+ SFJ      Full                                                        +---------+---------------+---------+-----------+----------+--------------+ FV Prox  Full                                                        +---------+---------------+---------+-----------+----------+--------------+ FV Mid   Full                                                        +---------+---------------+---------+-----------+----------+--------------+  FV Distal               Yes      Yes                                 +---------+---------------+---------+-----------+----------+--------------+ PFV      Full                                                        +---------+---------------+---------+-----------+----------+--------------+ POP                     Yes      Yes                                 +---------+---------------+---------+-----------+----------+--------------+ PTV                                                   Not visualized +---------+---------------+---------+-----------+----------+--------------+ PERO                                                  Not visualized +---------+---------------+---------+-----------+----------+--------------+     Summary: RIGHT: - There is no evidence of deep vein thrombosis in the lower extremity. However, portions of this examination were limited- see technologist comments above.  LEFT: - There is no evidence of deep vein thrombosis in the lower extremity. However, portions of this examination were limited- see technologist comments above.  - A cystic structure is found in the popliteal fossa.  *See table(s) above for measurements and observations. Electronically signed by Ruta Hinds MD on 01/04/2020 at 6:22:05 PM.    Final      Time coordinating discharge: Over 85 minutes    Dwyane Dee, MD  [facility] 01/11/2020, 12:28 PM

## 2020-01-12 ENCOUNTER — Non-Acute Institutional Stay (SKILLED_NURSING_FACILITY): Payer: Medicare Other | Admitting: Internal Medicine

## 2020-01-12 ENCOUNTER — Encounter: Payer: Self-pay | Admitting: Internal Medicine

## 2020-01-12 ENCOUNTER — Encounter: Payer: Self-pay | Admitting: *Deleted

## 2020-01-12 ENCOUNTER — Other Ambulatory Visit: Payer: Self-pay | Admitting: Cardiology

## 2020-01-12 DIAGNOSIS — I480 Paroxysmal atrial fibrillation: Secondary | ICD-10-CM | POA: Insufficient documentation

## 2020-01-12 DIAGNOSIS — R5381 Other malaise: Secondary | ICD-10-CM | POA: Diagnosis not present

## 2020-01-12 DIAGNOSIS — M48062 Spinal stenosis, lumbar region with neurogenic claudication: Secondary | ICD-10-CM | POA: Diagnosis not present

## 2020-01-12 DIAGNOSIS — D509 Iron deficiency anemia, unspecified: Secondary | ICD-10-CM

## 2020-01-12 DIAGNOSIS — I2510 Atherosclerotic heart disease of native coronary artery without angina pectoris: Secondary | ICD-10-CM

## 2020-01-12 DIAGNOSIS — I5032 Chronic diastolic (congestive) heart failure: Secondary | ICD-10-CM

## 2020-01-12 DIAGNOSIS — E0591 Thyrotoxicosis, unspecified with thyrotoxic crisis or storm: Secondary | ICD-10-CM | POA: Diagnosis not present

## 2020-01-12 DIAGNOSIS — Z794 Long term (current) use of insulin: Secondary | ICD-10-CM

## 2020-01-12 DIAGNOSIS — B9620 Unspecified Escherichia coli [E. coli] as the cause of diseases classified elsewhere: Secondary | ICD-10-CM

## 2020-01-12 DIAGNOSIS — J189 Pneumonia, unspecified organism: Secondary | ICD-10-CM

## 2020-01-12 DIAGNOSIS — E1122 Type 2 diabetes mellitus with diabetic chronic kidney disease: Secondary | ICD-10-CM

## 2020-01-12 DIAGNOSIS — N390 Urinary tract infection, site not specified: Secondary | ICD-10-CM

## 2020-01-12 DIAGNOSIS — N1831 Chronic kidney disease, stage 3a: Secondary | ICD-10-CM

## 2020-01-12 NOTE — Progress Notes (Signed)
Patient ID: Rebecca [name], female   DOB: 1932/09/28, 86 y.o.   MRN: [id number] Patient enrolled for [employer] to ship a 30 day cardiac event monitor to : [facility] c/o Keiri Bushard [address],  [place], [place]  [address] Attn:  Hollie Salk Letter with instructions mailed to same address.

## 2020-01-12 NOTE — Progress Notes (Signed)
Provider:  Rexene Edison. Mariea Clonts, D.O., C.M.D. Location:  [facility] Room Number: 110P Place of Service:  SNF (31)  PCP: Binnie Rail, MD Patient Care Team: Binnie Rail, MD as PCP - General (Internal Medicine)  Extended Emergency Contact Information Primary Emergency Contact: Bear,Charlie L Address: [address]          [place], [place] Phone: [phone] Relation: Son Secondary Emergency Contact: Saleah, Rishel Mobile Phone: [phone] Relation: Other  Code Status: FULL CODE Goals of Care: Advanced Directive information Advanced Directives 01/12/2020  Does Patient Have a Medical Advance Directive? No  Type of Advance Directive -  Does patient want to make changes to medical advance directive? -  Copy of [place] in Chart? -  Would patient like information on creating a medical advance directive? No - Patient declined   Chief Complaint  Patient presents with  . New Admit To SNF    New Admit     HPI: Patient is a 86 y.o. female seen today for admission to [facility] status post hospitalization from October 23 to November 1 at [facility] with thyroid storm.  She has a past medical history significant for diabetes on insulin, hypertension, chronic kidney disease stage IIIa, hyperlipidemia, reflux, multinodular thyroid goiter, sickle cell trait coronary artery disease status post PCI with 2 stents in the 90s, and chronic lower extremity edema for which she had been taking as needed hydrochlorothiazide.  She had presented to the emergency department from home due to shortness of breath and increased peripheral edema.  Glucose levels have been fluctuant with food in the emergency department rating to 61 and 308.  She was noted to have pulmonary edema on her chest x-ray and troponins of 47 with a repeat of 47.  BNP was 332.  EKG showed sinus rhythm with PVCs but no ischemia.   At baseline she was living at home alone and using a cane for assistance with ambulation.  She had gotten so weak she was unable to to get out of bed and her son had to carry her to the restroom.  She cannot ambulate due to not being able to feel her feet and her balance being off.  In the ED she received IV Lasix 20 mg was admitted for diuresis and heart failure work-up.  She also noted to have microcytic anemia with hemoglobin around 11.  Iron labs were borderline low and B12 was adequate.  Renal function was at baseline.  Her amlodipine was held due to her edema she was continued on her ACE.  Imdur was also initially held.  Ultimately patient was found to have grossly abnormal thyroid studies with a suppressed TSH of <0.010 and elevated freeT4 of 5.39 and freeT3 13.4. She was treated for thyroid storm with methimazole, iodine drops and hydrocortisone.  Also given propranolol and found to be in A. fib with RVR shortly after admission.  She did convert spontaneously back to normal sinus rhythm and the beta-blocker was tapered down.  Iodine drops and steroids were discontinued prior to discharge at endocrinology recommendation.  The methimazole dose was decreased.  Was given instructions for Zio patch monitor in case she has recurrent paroxysmal A. Fib for 2 weeks.  Her free T4 was trending down and will need to be repeated on an outpatient setting.    Echocardiogram did reveal ejection fraction of 60 to 65% and grade 2 diastolic  dysfunction.    Type 2 diabetes: Last A1c was 7.4 in March repeat in October was 6.5.  She was on a sliding scale at the hospital for prandial coverage but her home NPH was resumed at discharge.  For her CAD and PAD she was continued on her aspirin statin and ACE inhibitor.  She has been having increased lower extremity weakness and pain in bilateral inner thighs without any red flag symptoms.  Bladder scan every 6 hours was performed she was able to void after the first day  of her stay.  She will end up undergoing an MRI of her brain and her lumbar spine.  Consistent with multiple levels of degenerative change and a congenitally narrow spinal canal.  She had severe canal stenosis and moderate to severe right foraminal stenosis at L2-3.  MRI pain showed no acute findings.  Patient was not interested in additional imaging or surgery is to follow-up with neurosurgery outpatient.  She is here for PT OT.  She had a fever on October 29 that was felt and first to be related to thyroid storm; however, the chest x-ray October 26 showed subtle medial left base infiltrate concerning for possible pneumonia and she was given a course of Rocephin and Zithromax.  Needed for 5 days.  Blood cultures were negative to date and a urine culture grew pansensitive E. coli.  This is covered by the antibiotics for the pneumonia.  She had her [place] Covid vaccines on February 13 and March 10 and a booster on October 16.  Past Medical History:  Diagnosis Date  . Anemia, unspecified   . Ankle pain, left   . CAD (coronary artery disease)   . Esophageal reflux   . Fibrocystic breast disease   . Hiatal hernia   . Hypercholesteremia   . Hypertension   . IDDM (insulin dependent diabetes mellitus)    Type II  . Multinodular goiter   . Sickle-cell trait [facility])    Past Surgical History:  Procedure Laterality Date  . "FEMALE"  1976  . BACK SURGERY  1990  . BIOPSY THYROID     Dr.Ellison-Neg   . CORONARY ANGIOPLASTY    . FOOT SURGERY  2004  . RETINAL LASER PROCEDURE      Social History   Socioeconomic History  . Marital status: Widowed    Spouse name: Not on file  . Number of children: 1  . Years of education: Not on file  . Highest education level: Not on file  Occupational History  . Occupation: retired  Tobacco Use  . Smoking status: Never Smoker  . Smokeless tobacco: Never Used  Vaping Use  . Vaping Use: Never used  Substance and Sexual Activity  . Alcohol use: No  . Drug  use: No  . Sexual activity: Never  Other Topics Concern  . Not on file  Social History Narrative   Widowed   Gets regular exercise   Supportive church network   Receives daily meals on wheels   [employer] monthly grocery delivery      Social Determinants of Health   Financial Resource Strain:   . Difficulty of Paying Living Expenses: Not on file  Food Insecurity:   . Worried About Charity fundraiser in the Last Year: Not on file  . Ran Out of Food in the Last Year: Not on file  Transportation Needs:   . Lack of Transportation (Medical): Not on file  . Lack of Transportation (Non-Medical): Not on file  Physical Activity:   . Days of Exercise per Week: Not on file  . Minutes of Exercise per Session: Not on file  Stress:   . Feeling of Stress : Not on file  Social Connections:   . Frequency of Communication with Friends and Family: Not on file  . Frequency of Social Gatherings with Friends and Family: Not on file  . Attends Religious Services: Not on file  . Active Member of Clubs or Organizations: Not on file  . Attends Archivist Meetings: Not on file  . Marital Status: Not on file    reports that she has never smoked. She has never used smokeless tobacco. She reports that she does not drink alcohol and does not use drugs.  Functional Status Survey:    Family History  Problem Relation Age of Onset  . Hypertension Other   . Lung cancer Other   . Heart disease Other   . Gout Sister   . Gout Brother   . Heart attack Father   . Heart attack Mother   . Gout Brother     Health Maintenance  Topic Date Due  . TETANUS/TDAP  Never done  . DEXA SCAN  Never done  . FOOT EXAM  11/11/2019  . HEMOGLOBIN A1C  07/03/2020  . OPHTHALMOLOGY EXAM  08/23/2020  . INFLUENZA VACCINE  Completed  . COVID-19 Vaccine  Completed  . PNA vac Low Risk Adult  Completed    Allergies  Allergen Reactions  . Codeine     "Makes me drunk"    Outpatient Encounter Medications as  of 01/12/2020  Medication Sig  . Alcohol Swabs PADS Use as needed to clean puncture site.  . Ascorbic Acid (VITAMIN C) 500 MG tablet Take 500 mg by mouth 2 (two) times daily.    Marland Kitchen aspirin 81 MG tablet Take 81 mg by mouth daily.    Marland Kitchen atorvastatin (LIPITOR) 10 MG tablet TAKE 1 TABLET BY MOUTH  DAILY AT 6 PM  . Blood Glucose Monitoring Suppl (ONE TOUCH ULTRA 2) w/Device KIT Use to check blood sugars twice a day Dx e11.9  . brimonidine (ALPHAGAN) 0.2 % ophthalmic solution Place 1 drop into the right eye 2 (two) times daily.  . calcium carbonate (OS-CAL) 600 MG TABS Take 600 mg by mouth daily.   Marland Kitchen Cod Liver Oil 1000 MG CAPS Take 1,000 mg by mouth daily. During the winter  . DORZOLAMIDE HCL-TIMOLOL MAL OP Place 1 drop into the right eye 2 (two) times daily.  Marland Kitchen gabapentin (NEURONTIN) 300 MG capsule Take 300 mg by mouth daily.  . insulin NPH-regular Human (HUMULIN 70/30) (70-30) 100 UNIT/ML injection Inject 15 units in the morning and 7-10 units in the evening  . Insulin Syringe-Needle U-100 (BD VEO INSULIN SYRINGE U/F) 31G X 15/64" 0.3 ML MISC USE TWICE DAILY TO INJECT  INSULIN  . isosorbide mononitrate (IMDUR) 30 MG 24 hr tablet TAKE 1 TABLET BY MOUTH  DAILY  . latanoprost (XALATAN) 0.005 % ophthalmic solution 1 drop at bedtime.  Marland Kitchen lisinopril (ZESTRIL) 20 MG tablet Take 1 tablet (20 mg total) by mouth daily.  . methimazole (TAPAZOLE) 10 MG tablet Take 2 tablets (20 mg total) by mouth daily.  . Multiple Vitamin (MULTIVITAMIN) tablet Take 1 tablet by mouth daily. Prn  . nitroGLYCERIN (NITROSTAT) 0.4 MG SL tablet DISSOLVE ONE TABLET UNDER TONGUE AS DIRECTED FOR CHEST PAIN  . omeprazole (PRILOSEC) 20 MG capsule TAKE 1 CAPSULE BY MOUTH IN  THE MORNING  .  OneTouch Delica Lancets 51V MISC Use to check blood sugars 2 times daily. E11.9  . ONETOUCH ULTRA test strip USE 1 STRIP TO CHECK GLUCOSE TWICE DAILY  . propranolol (INDERAL) 20 MG tablet Take 1 tablet (20 mg total) by mouth 2 (two) times daily.  Loura Pardon Salicylate (ASPERCREME) 10 % LOTN Use twice daily as needed for right knee pain.  . [DISCONTINUED] gabapentin (NEURONTIN) 100 MG capsule TAKE 3 CAPSULES BY MOUTH AT BEDTIME (Patient taking differently: 300 mg. )   No facility-administered encounter medications on file as of 01/12/2020.    Review of Systems  Constitutional: Positive for malaise/fatigue. Negative for chills and fever.  HENT: Negative for congestion and sore throat.   Eyes: Negative for blurred vision.  Respiratory: Negative for cough and shortness of breath.   Cardiovascular: Negative for chest pain, palpitations and leg swelling.  Gastrointestinal: Negative for abdominal pain.  Genitourinary: Negative for dysuria.  Musculoskeletal: Negative for falls and joint pain.  Skin: Negative for itching and rash.  Neurological: Positive for weakness. Negative for dizziness and loss of consciousness.  Psychiatric/Behavioral:       Some repetition of what I'd just said, but also able to provide good history other times    Vitals:   01/12/20 1345  BP: (!) 158/63  Pulse: 69  Temp: (!) 97.5 F (36.4 C)  SpO2: 94%  Weight: 190 lb 11.2 oz (86.5 kg)  Height: _0  (1.6 m)   Body mass index is 33.78 kg/m. Physical Exam Vitals reviewed.  Constitutional:      General: She is not in acute distress.    Appearance: Normal appearance. She is obese. She is not toxic-appearing.  HENT:     Head: Normocephalic and atraumatic.     Right Ear: External ear normal.     Left Ear: External ear normal.     Nose: Nose normal.     Mouth/Throat:     Pharynx: Oropharynx is clear.  Eyes:     Extraocular Movements: Extraocular movements intact.     Conjunctiva/sclera: Conjunctivae normal.     Pupils: Pupils are equal, round, and reactive to light.  Cardiovascular:     Rate and Rhythm: Rhythm irregular.     Pulses: Normal pulses.     Heart sounds: No murmur heard.  No friction rub. No gallop.   Pulmonary:     Effort: Pulmonary  effort is normal.     Breath sounds: Normal breath sounds. No rales.  Abdominal:     General: Bowel sounds are normal. There is no distension.     Palpations: Abdomen is soft.     Tenderness: There is no abdominal tenderness. There is no guarding or rebound.  Musculoskeletal:        General: Normal range of motion.     Right lower leg: No edema.     Left lower leg: No edema.  Skin:    General: Skin is warm and dry.  Neurological:     General: No focal deficit present.     Mental Status: She is alert and oriented to person, place, and time.     Motor: Weakness present.  Psychiatric:        Mood and Affect: Mood normal.     Labs reviewed: Basic Metabolic Panel: Recent Labs    01/04/20 0528 01/04/20 0528 01/05/20 0505 01/05/20 0505 01/06/20 0443 01/06/20 0443 01/07/20 0410 01/08/20 0421 01/11/20 0443  NA 135   < > 135   < > 134*   < >  135 139 137  K 4.1   < > 4.2   < > 4.0   < > 3.6 3.2* 4.5  CL 100   < > 98   < > 97*   < > 97* 102 101  CO2 25   < > 26   < > 24   < > _0 GLUCOSE 244*   < > 338*   < > 299*   < > 231* 88 259*  BUN 35*   < > 48*   < > 62*   < > 85* 79* 43*  CREATININE 0.96   < > 0.92   < > 0.97   < > 1.26* 1.10* 1.02*  CALCIUM 9.0   < > 9.1   < > 9.0   < > 8.9 8.8* 8.6*  MG 1.7   < > 1.8   < > 2.0   < > 2.2 2.3 2.3  PHOS 3.5  --  3.8  --  4.5  --   --   --   --    < > = values in this interval not displayed.   Liver Function Tests: Recent Labs    01/05/20 0505 01/06/20 0443 01/11/20 0443  AST _1 ALT 23 18 32  ALKPHOS 70 63 68  BILITOT 0.7 0.4 1.0  PROT 6.4* 5.8* 5.2*  ALBUMIN 2.5* 2.2* 2.1*   No results for input(s): LIPASE, AMYLASE in the last 8760 hours. No results for input(s): AMMONIA in the last 8760 hours. CBC: Recent Labs    01/07/20 0410 01/08/20 0421 01/11/20 0443  WBC 9.8 10.9* 16.9*  NEUTROABS 8.3* 9.1* 12.5*  HGB 10.5* 11.1* 10.5*  HCT 30.7* 33.1* 32.3*  MCV 75.6* 76.1* 78.2*  PLT 295 334 321   Cardiac  Enzymes: No results for input(s): CKTOTAL, CKMB, CKMBINDEX, TROPONINI in the last 8760 hours. BNP: Invalid input(s): POCBNP Lab Results  Component Value Date   HGBA1C 6.5 (H) 01/03/2020   Lab Results  Component Value Date   TSH <0.010 (L) 01/05/2020   Lab Results  Component Value Date   VITAMINB12 1,913 (H) 01/03/2020   Lab Results  Component Value Date   FOLATE 19.1 09/18/2007   Lab Results  Component Value Date   IRON 23 (L) 01/03/2020   TIBC 193 (L) 01/03/2020   FERRITIN 490 (H) 01/03/2020    Imaging and Procedures obtained prior to SNF admission: MR BRAIN WO CONTRAST  Result Date: 01/03/2020 CLINICAL DATA:  Acute stroke suspected.  Neuro deficit. EXAM: MRI HEAD WITHOUT CONTRAST TECHNIQUE: Multiplanar, multiecho pulse sequences of the brain and surrounding structures were obtained without intravenous contrast. COMPARISON:  None. FINDINGS: Brain: No acute infarction, hemorrhage, hydrocephalus, extra-axial collection or mass lesion. Generalized cortical atrophy. Mild chronic small vessel ischemia in the periventricular white matter for age. Remote lacunar infarct at the right thalamus. Small remote right cerebellar infarct. Vascular: Preserved flow voids Skull and upper cervical spine: Normal marrow signal Sinuses/Orbits: Bilateral cataract resection IMPRESSION: 1. No acute finding, including infarct. 2. Cortical atrophy and chronic small vessel disease. Electronically Signed   By: Monte Fantasia M.D.   On: 01/03/2020 12:02   MR LUMBAR SPINE WO CONTRAST  Result Date: 01/03/2020 CLINICAL DATA:  Low back pain with bilateral leg weakness EXAM: MRI LUMBAR SPINE WITHOUT CONTRAST TECHNIQUE: Multiplanar, multisequence MR imaging of the lumbar spine was performed. No intravenous contrast was administered. COMPARISON:  X-ray 11/06/2018 FINDINGS: Segmentation: Suspect transitional lumbosacral anatomy with partial  sacralization of the L5 segment. Grade 1 anterolisthesis is present at the  L4-5 level, in keeping with numbering convention established on previous radiographs. Alignment:  5 mm grade 1 anterolisthesis L4 on L5. Vertebrae: No fracture, evidence of discitis, or bone lesion. Mild diffuse intrinsic canal narrowing on the basis of congenitally short pedicles. Conus medullaris and cauda equina: Conus extends to the T12 level. Conus and cauda equina appear within normal limits. Paraspinal and other soft tissues: Cortically based T2 hyperintense lesionswithin the left kidney, incompletely characterized, but most likely represent cysts. Moderate distention of the urinary bladder is partially visualized. Disc levels: T11-T12: Sagittal sequences only. Moderate diffuse disc bulge with mild canal stenosis, moderate left and mild-to-moderate right foraminal stenosis. T12-L1: Sagittal sequences only. Mild circumferential disc bulge without evidence of foraminal or canal stenosis. L1-L2: Mild circumferential disc bulge with bilateral facet arthropathy and ligamentum flavum buckling resulting in mild-to-moderate canal stenosis with mild bilateral foraminal stenosis. L2-L3: Diffuse disc bulge with advanced bilateral facet arthropathy and ligamentum flavum buckling resulting in severe canal stenosis with moderate to severe right foraminal stenosis. L3-L4: Postsurgical changes to the left lamina. Mild diffuse disc bulge with bilateral facet arthropathy and ligamentum flavum buckling resulting in mild canal stenosis with moderate to severe right and mild-to-moderate left foraminal stenosis. L4-L5: Postsurgical changes to the left lamina. Disc uncovering with mild diffuse disc bulge. Bilateral facet arthropathy. No canal stenosis. Moderate bilateral foraminal stenosis, left worse than right. L5-S1: Mild posterior osteophytic spurring without foraminal or canal stenosis. IMPRESSION: 1. Multilevel degenerative changes of the lumbar spine superimposed on a congenitally narrow canal. Findings are most pronounced  at the L2-3 level where there is severe canal stenosis and moderate-to-severe right foraminal stenosis. 2. Additional levels of mild-to-moderate canal stenosis, as above. 3. Moderate-to-severe right foraminal stenosis at L3-4. 4. Moderate bilateral foraminal stenosis at L4-5. 5. Moderate distention of the urinary bladder. Correlate for urinary retention. Electronically Signed   By: Davina Poke D.O.   On: 01/03/2020 11:49   DG Chest Port 1 View  Result Date: 01/02/2020 CLINICAL DATA:  Weakness EXAM: PORTABLE CHEST 1 VIEW COMPARISON:  None. FINDINGS: The heart size and mediastinal contours are mildly enlarged. Aortic knob calcifications are seen. There is prominence of the central pulmonary vasculature. Both lungs are clear. The visualized skeletal structures are unremarkable. IMPRESSION: Mild cardiomegaly and pulmonary vascular congestion. Electronically Signed   By: Prudencio Pair M.D.   On: 01/02/2020 23:18   ECHOCARDIOGRAM COMPLETE  Result Date: 01/03/2020    ECHOCARDIOGRAM REPORT   Patient Name:   TAMORA HUNEKE [facility] Date of Exam: 01/03/2020 Medical Rec #:  [id number]            Height:       63.0 in Accession #:    [id number]           Weight:       192.2 lb Date of Birth:  February 01, 1933            BSA:          1.901 m Patient Age:    30 years             BP:           140/60 mmHg Patient Gender: F                    HR:           105 bpm. Exam Location:  Inpatient Procedure: 2D Echo, Color Doppler and  Cardiac Doppler Indications:    I50.9* Heart failure (unspecified)  History:        Patient has no prior history of Echocardiogram examinations.                 CHF, CAD; Risk Factors:Hypertension, Diabetes, Dyslipidemia and                 Sickle Cell.  Sonographer:    Raquel Sarna Senior RDCS Referring Phys: Ashley  Sonographer Comments: Technically difficult due to poor echo windows; patient unable to tolerate any probe pressure. IMPRESSIONS  1. Left ventricular ejection fraction, by estimation,  is 60 to 65%. The left ventricle has normal function. The left ventricle has no regional wall motion abnormalities. There is mild asymmetric left ventricular hypertrophy of the basal-septal segment. Left ventricular diastolic parameters are consistent with Grade II diastolic dysfunction (pseudonormalization). Elevated left atrial pressure.  2. Right ventricular systolic function is normal. The right ventricular size is normal.  3. A small pericardial effusion is present. The pericardial effusion is circumferential. There is no evidence of cardiac tamponade.  4. The mitral valve is grossly normal. Mild mitral valve regurgitation. No evidence of mitral stenosis.  5. The aortic valve is tricuspid. Aortic valve regurgitation is not visualized. No aortic stenosis is present. FINDINGS  Left Ventricle: Left ventricular ejection fraction, by estimation, is 60 to 65%. The left ventricle has normal function. The left ventricle has no regional wall motion abnormalities. The left ventricular internal cavity size was normal in size. There is  mild asymmetric left ventricular hypertrophy of the basal-septal segment. Left ventricular diastolic parameters are consistent with Grade II diastolic dysfunction (pseudonormalization). Elevated left atrial pressure. Right Ventricle: The right ventricular size is normal. No increase in right ventricular wall thickness. Right ventricular systolic function is normal. Left Atrium: Left atrial size was normal in size. Right Atrium: Right atrial size was normal in size. Pericardium: A small pericardial effusion is present. The pericardial effusion is circumferential. There is no evidence of cardiac tamponade. Mitral Valve: The mitral valve is grossly normal. Mild mitral valve regurgitation. No evidence of mitral valve stenosis. Tricuspid Valve: The tricuspid valve is grossly normal. Tricuspid valve regurgitation is mild . No evidence of tricuspid stenosis. Aortic Valve: The aortic valve is  tricuspid. Aortic valve regurgitation is not visualized. No aortic stenosis is present. Pulmonic Valve: The pulmonic valve was grossly normal. Pulmonic valve regurgitation is trivial. No evidence of pulmonic stenosis. Aorta: The aortic root and ascending aorta are structurally normal, with no evidence of dilitation. Venous: The inferior vena cava was not well visualized. IAS/Shunts: The atrial septum is grossly normal.  LEFT VENTRICLE PLAX 2D LVIDd:         4.00 cm  Diastology LVIDs:         3.00 cm  LV e' medial:    4.68 cm/s LV PW:         0.90 cm  LV E/e' medial:  15.9 LV IVS:        1.20 cm  LV e' lateral:   4.24 cm/s LVOT diam:     2.00 cm  LV E/e' lateral: 17.6 LV SV:         51 LV SV Index:   27 LVOT Area:     3.14 cm  RIGHT VENTRICLE RV S prime:     23.40 cm/s TAPSE (M-mode): 2.7 cm LEFT ATRIUM           Index  RIGHT ATRIUM           Index LA diam:      2.10 cm 1.10 cm/m  RA Area:     13.50 cm LA Vol (A2C): 23.8 ml 12.52 ml/m RA Volume:   29.90 ml  15.73 ml/m LA Vol (A4C): 48.8 ml 25.67 ml/m  AORTIC VALVE LVOT Vmax:   87.50 cm/s LVOT Vmean:  67.100 cm/s LVOT VTI:    0.162 m  AORTA Ao Root diam: 3.30 cm Ao Asc diam:  3.20 cm MITRAL VALVE                TRICUSPID VALVE MV Area (PHT): 4.96 cm     TR Peak grad:   42.0 mmHg MV Decel Time: 153 msec     TR Vmax:        324.00 cm/s MV E velocity: 74.60 cm/s MV A velocity: 105.00 cm/s  SHUNTS MV E/A ratio:  0.71         Systemic VTI:  0.16 m                             Systemic Diam: 2.00 cm Eleonore Chiquito MD Electronically signed by Eleonore Chiquito MD Signature Date/Time: 01/03/2020/10:31:23 AM    Final     Assessment/Plan 1. Thyrotoxicosis with thyrotoxic crisis, unspecified thyrotoxicosis type -cont methimazole -TSH trending up, free T3 and free T4 trending down -f/u labs in another week -f/u with endocrine outpatient  2. Spinal stenosis of lumbar region with neurogenic claudication -cont gabapentin therapy  3. Microcytic anemia -continue to  monitor  4. Physical deconditioning -here for PT, OT after thyroid storm   5. Chronic diastolic CHF (congestive heart failure) (HCC) -stable, not on regular diuretics  6. Coronary artery disease involving native coronary artery of native heart without angina pectoris -continues on ace, propranolol to help with rate from admission, prn ntg, imdur, lipitor, baby asa  7. Chronic kidney disease, stage 3a (HCC) -Avoid nephrotoxic agents like nsaids, dose adjust renally excreted meds, hydrate. -contributory to anemia  8. Community acquired pneumonia of right middle lobe of lung -completed course of abx and clinically resolved  9. Type 2 diabetes mellitus with stage 3a chronic kidney disease, with long-term current use of insulin ([place]) -well controlled on humulin mix, on ace and statin Lab Results  Component Value Date   HGBA1C 6.5 (H) 01/03/2020   10. E. coli UTI (urinary tract infection) -resolved with abx, encourage hydration   11. Paroxysmal A-fib (HCC) -rate control difficulty amid thyroid storm, now better, on propranol  Family/ staff Communication: d/w snf nurse  Labs/tests ordered:  Tsh, free T4 at one week  Chianne Byrns L. Liticia Gasior, D.O. [facility] [address], [place] [address] Cell Phone (Mon-Fri 8am-5pm):  [phone] On Call:  [phone] & follow prompts after 5pm & weekends Office Phone:  [phone] Office Fax:  [phone]

## 2020-01-15 ENCOUNTER — Ambulatory Visit (INDEPENDENT_AMBULATORY_CARE_PROVIDER_SITE_OTHER): Payer: Medicare Other

## 2020-01-15 DIAGNOSIS — I480 Paroxysmal atrial fibrillation: Secondary | ICD-10-CM

## 2020-01-18 ENCOUNTER — Other Ambulatory Visit: Payer: Self-pay

## 2020-01-18 ENCOUNTER — Ambulatory Visit (INDEPENDENT_AMBULATORY_CARE_PROVIDER_SITE_OTHER): Payer: Medicare Other | Admitting: Endocrinology

## 2020-01-18 ENCOUNTER — Encounter: Payer: Self-pay | Admitting: Endocrinology

## 2020-01-18 VITALS — BP 130/60 | HR 59

## 2020-01-18 DIAGNOSIS — E0590 Thyrotoxicosis, unspecified without thyrotoxic crisis or storm: Secondary | ICD-10-CM | POA: Diagnosis not present

## 2020-01-18 DIAGNOSIS — I10 Essential (primary) hypertension: Secondary | ICD-10-CM | POA: Diagnosis not present

## 2020-01-18 DIAGNOSIS — R531 Weakness: Secondary | ICD-10-CM | POA: Diagnosis not present

## 2020-01-18 DIAGNOSIS — E0591 Thyrotoxicosis, unspecified with thyrotoxic crisis or storm: Secondary | ICD-10-CM | POA: Diagnosis not present

## 2020-01-18 LAB — COMPREHENSIVE METABOLIC PANEL
ALT: 21 U/L (ref 0–35)
AST: 18 U/L (ref 0–37)
Albumin: 2.4 g/dL — ABNORMAL LOW (ref 3.5–5.2)
Alkaline Phosphatase: 88 U/L (ref 39–117)
BUN: 24 mg/dL — ABNORMAL HIGH (ref 6–23)
CO2: 27 mEq/L (ref 19–32)
Calcium: 9.1 mg/dL (ref 8.4–10.5)
Chloride: 97 mEq/L (ref 96–112)
Creatinine, Ser: 0.78 mg/dL (ref 0.40–1.20)
GFR: 69.18 mL/min (ref >60.00)
Glucose, Bld: 152 mg/dL — ABNORMAL HIGH (ref 70–99)
Potassium: 4.7 mEq/L (ref 3.5–5.1)
Sodium: 131 mEq/L — ABNORMAL LOW (ref 135–145)
Total Bilirubin: 0.6 mg/dL (ref 0.2–1.2)
Total Protein: 5.7 g/dL — ABNORMAL LOW (ref 6.0–8.3)

## 2020-01-18 LAB — CBC WITH DIFFERENTIAL/PLATELET
Basophils Absolute: 0.1 10*3/uL (ref 0.0–0.1)
Basophils Relative: 0.8 % (ref 0.0–3.0)
Eosinophils Absolute: 0.1 10*3/uL (ref 0.0–0.7)
Eosinophils Relative: 1 % (ref 0.0–5.0)
HCT: 29.5 % — ABNORMAL LOW (ref 36.0–46.0)
Hemoglobin: 9.6 g/dL — ABNORMAL LOW (ref 12.0–15.0)
Lymphocytes Relative: 9.8 % — ABNORMAL LOW (ref 12.0–46.0)
Lymphs Abs: 1.2 10*3/uL (ref 0.7–4.0)
MCHC: 32.5 g/dL (ref 30.0–36.0)
MCV: 76.7 fl — ABNORMAL LOW (ref 78.0–100.0)
Monocytes Absolute: 1.1 10*3/uL — ABNORMAL HIGH (ref 0.1–1.0)
Monocytes Relative: 8.7 % (ref 3.0–12.0)
Neutro Abs: 10 10*3/uL — ABNORMAL HIGH (ref 1.4–7.7)
Neutrophils Relative %: 79.7 % — ABNORMAL HIGH (ref 43.0–77.0)
Platelets: 342 10*3/uL (ref 150.0–400.0)
RBC: 3.84 Mil/uL — ABNORMAL LOW (ref 3.87–5.11)
RDW: 16.9 % — ABNORMAL HIGH (ref 11.5–15.5)
WBC: 12.6 10*3/uL — ABNORMAL HIGH (ref 4.0–10.5)

## 2020-01-18 LAB — T4, FREE: Free T4: 1.47 ng/dL (ref 0.60–1.60)

## 2020-01-18 LAB — TSH: TSH: 0.02 u[IU]/mL — ABNORMAL LOW (ref 0.35–4.50)

## 2020-01-18 NOTE — Progress Notes (Signed)
Patient ID: Rebecca [name], female   DOB: 02-28-1933, 86 y.o.   MRN: [id number]                                                                                                               Reason for Appointment:  Hyperthyroidism, new consultation  Referring healthcare provider: Billey Gosling   Chief complaint:   History of Present Illness:   She was admitted to the hospital on 01/02/2020 because of progressive weakness. She apparently previously was living fairly independently at home and walking with a walker but for about a week or so she got progressively weak and could not get out of the chair. She also had some decreased appetite and appears to have had some weight loss Patient is not able to give much history She says she would periodically feel cold but not hot and no sweating At times she may have a little shakiness She has had some soft stools with also increased frequency of stools She did not remember any episodes of palpitations However during her hospitalization she was noted to have some intermittent fever along with atrial fibrillation on 10/26 with rapid heart rate  She was treated in the hospital with methimazole, propranolol and iodine drops.  Initially was started on at least 40 mg of methimazole and this was reduced to 20 mg at the time of discharge along with decreasing her propranolol to 20 mg a day I-131 uptake not done  Her daughter-in-law says that she is still relatively lethargic, not eating as well but not complaining of any shakiness consistently.  She still has somewhat decreased appetite and significant weakness   Wt Readings from Last 3 Encounters:  01/12/20 190 lb 11.2 oz (86.5 kg)  01/09/20 190 lb 11.2 oz (86.5 kg)  11/30/19 204 lb 12.8 oz (92.9 kg)     Treatments so far: Methimazole as above  Thyroid function tests as follows:     Lab Results  Component Value Date   FREET4 3.10 (H) 01/11/2020   FREET4 3.69 (H) 01/08/2020   FREET4  5.01 (H) 01/05/2020   T3FREE 6.4 (H) 01/05/2020   T3FREE 13.4 (H) 01/03/2020   TSH <0.010 (L) 01/05/2020   TSH <0.010 (L) 01/03/2020   TSH 0.80 07/14/2015    No results found for: THYROTRECAB  Thyroid ultrasound showed heterogenous tissue but no nodules   Allergies as of 01/18/2020      Reactions   Codeine    "Makes me drunk"      Medication List       Accurate as of January 18, 2020  2:45 PM. If you have any questions, ask your nurse or doctor.        Alcohol Swabs Pads Use as needed to clean puncture site.   Aspercreme 10 % Lotn Generic drug: Trolamine Salicylate Use twice daily as needed for right knee pain.   aspirin 81 MG tablet Take 81 mg by mouth daily.   atorvastatin 10 MG tablet Commonly known as: LIPITOR TAKE 1 TABLET BY  MOUTH  DAILY AT 6 PM   BD Veo Insulin Syringe U/F 31G X 15/64" 0.3 ML Misc Generic drug: Insulin Syringe-Needle U-100 USE TWICE DAILY TO INJECT  INSULIN   brimonidine 0.2 % ophthalmic solution Commonly known as: ALPHAGAN Place 1 drop into the right eye 2 (two) times daily.   calcium carbonate 600 MG Tabs tablet Commonly known as: OS-CAL Take 600 mg by mouth daily.   Cod Liver Oil 1000 MG Caps Take 1,000 mg by mouth daily. During the winter   DORZOLAMIDE HCL-TIMOLOL MAL OP Place 1 drop into the right eye 2 (two) times daily.   gabapentin 300 MG capsule Commonly known as: NEURONTIN Take 300 mg by mouth daily.   HumuLIN 70/30 (70-30) 100 UNIT/ML injection Generic drug: insulin NPH-regular Human Inject 15 units in the morning and 7-10 units in the evening   isosorbide mononitrate 30 MG 24 hr tablet Commonly known as: IMDUR TAKE 1 TABLET BY MOUTH  DAILY   latanoprost 0.005 % ophthalmic solution Commonly known as: XALATAN 1 drop at bedtime.   lisinopril 20 MG tablet Commonly known as: ZESTRIL Take 1 tablet (20 mg total) by mouth daily.   methimazole 10 MG tablet Commonly known as: TAPAZOLE Take 2 tablets (20 mg total)  by mouth daily.   multivitamin tablet Take 1 tablet by mouth daily. Prn   nitroGLYCERIN 0.4 MG SL tablet Commonly known as: Nitrostat DISSOLVE ONE TABLET UNDER TONGUE AS DIRECTED FOR CHEST PAIN   omeprazole 20 MG capsule Commonly known as: PRILOSEC TAKE 1 CAPSULE BY MOUTH IN  THE MORNING   ONE TOUCH ULTRA 2 w/Device Kit Use to check blood sugars twice a day Dx M42.6   OneTouch Delica Lancets 83M Misc Use to check blood sugars 2 times daily. E11.9   OneTouch Ultra test strip Generic drug: glucose blood USE 1 STRIP TO CHECK GLUCOSE TWICE DAILY   propranolol 20 MG tablet Commonly known as: INDERAL Take 1 tablet (20 mg total) by mouth 2 (two) times daily.   vitamin C 500 MG tablet Commonly known as: ASCORBIC ACID Take 500 mg by mouth 2 (two) times daily.           Past Medical History:  Diagnosis Date  . Anemia, unspecified   . Ankle pain, left   . CAD (coronary artery disease)   . Esophageal reflux   . Fibrocystic breast disease   . Hiatal hernia   . Hypercholesteremia   . Hypertension   . IDDM (insulin dependent diabetes mellitus)    Type II  . Multinodular goiter   . Sickle-cell trait [facility] [place])     Past Surgical History:  Procedure Laterality Date  . "FEMALE"  1976  . BACK SURGERY  1990  . BIOPSY THYROID     Dr.Ellison-Neg   . CORONARY ANGIOPLASTY    . FOOT SURGERY  2004  . RETINAL LASER PROCEDURE      Family History  Problem Relation Age of Onset  . Hypertension Other   . Lung cancer Other   . Heart disease Other   . Gout Sister   . Gout Brother   . Thyroid disease Brother   . Heart attack Father   . Heart attack Mother   . Gout Brother     Social History:  reports that she has never smoked. She has never used smokeless tobacco. She reports that she does not drink alcohol and does not use drugs.  Allergies:  Allergies  Allergen Reactions  . Codeine     "  Makes me drunk"     Review of Systems  Constitutional: Positive for weight loss.  Negative for reduced appetite.  HENT: Negative for hoarseness and trouble swallowing.        She does not have hoarseness but her voice is low  Respiratory: Positive for daytime sleepiness.   Cardiovascular: Negative for palpitations.  Gastrointestinal: Positive for diarrhea. Negative for constipation.  Endocrine: Positive for fatigue.       She thinks she has had diabetes on insulin for 60 years also.  She will take insulin twice a day at home.  Recent blood sugar levels are not available but she is getting 70/30 insulin in the morning, 15 units and bedtime dose based on blood sugar level only.  No recent low sugar episodes  Musculoskeletal: Positive for joint pain.  Neurological: Positive for weakness and tremors.   She has anemia of unclear etiology, reportedly iron deficient  Lab Results  Component Value Date   HGB 10.5 (L) 01/11/2020      Examination:   BP 130/60   Pulse (!) 59   SpO2 93%    General Appearance:  She has mild generalized obesity.  She is somewhat somnolent but answers most of her questions .        Eyes: No abnormal prominence, lid lag or stare present.  No swelling of the eyelids   Neck: The thyroid is not palpable  There is no lymphadenopathy in the neck .           Heart: normal S1 and S2, no murmurs .           Lungs: breath sounds are normal bilaterally without added sounds  Abdomen: Not palpable as patient unable to get in exam chair   Extremities: hands are normal temperature. No ankle edema.  Neurological:  No fine tremors are present. Deep tendon reflexes at biceps are difficult to elicit but do not appear abnormal.  Skin: No rash, abnormal thickening of the skin on the lower legs seen   She has trace edema of the lower legs with some tenderness to palpation MDM:   Assessment/Plan:   Hyperthyroidism, Likely to be from Graves' disease     Discussed with the patient and her daughter-in-law the hyperthyroidism is a result of an  autoimmune condition involving the thyroid.  Explained that the options for treatment are basically antithyroid drugs and radioactive iodine.  She will likely need I-131 because of her age and marked hyperthyroidism that will likely not get into remission with methimazole alone Discussed that I-131 treatment is safe and simple to do but will result in long-term hypothyroidism that will result from ablation of the thyroid tissue and the need for lifelong supplementation and periodic monitoring.  Clinically would be appropriate for the patient to be treated with antithyroid drugs at this time with methimazole to get her thyroid levels under control for symptomatic relief and then consider I-131 treatment Also she has had treatment with iodine drops in the hospital and will not be able to do an uptake for at least 6 weeks  Thyroid levels will be checked today and her methimazole adjusted We will also need to do thyrotropin receptor antibody to confirm her diagnosis Baseline liver functions and CBC also to be done today  Also recommended that she start on nutritional supplements because of decreased intake and recent weight loss and likely muscle wasting from severe hyperthyroidism  Patient understands the above discussion and treatment options. All questions were answered satisfactorily  DIABETES: This is being followed by the nursing home physician currently However appears to be taking 70/30 insulin at bedtime instead of before dinnertime, no treatment to the nursing home to change the timing of insulin, dose cannot be adjusted because of lack of data available from her glucose monitoring  Consult note sent to referring physician  Elayne Snare 01/18/2020, 2:45 PM    Note: This office note was prepared with Dragon voice recognition system technology. Any transcriptional errors that result from this process are unintentional.

## 2020-01-19 LAB — THYROTROPIN RECEPTOR AUTOABS: Thyrotropin Receptor Ab: 1.96 IU/L — ABNORMAL HIGH (ref 0.00–1.75)

## 2020-01-19 NOTE — Progress Notes (Signed)
Please call to let patient/family member know that the thyroid results are getting normal.  Need to fax order to the nursing home to change methimazole to 10 mg twice daily

## 2020-01-25 ENCOUNTER — Telehealth: Payer: Self-pay

## 2020-01-25 ENCOUNTER — Telehealth: Payer: Self-pay | Admitting: Internal Medicine

## 2020-01-25 DIAGNOSIS — I480 Paroxysmal atrial fibrillation: Secondary | ICD-10-CM | POA: Diagnosis not present

## 2020-01-25 NOTE — Telephone Encounter (Signed)
Ann from [facility] center called to discuss   Order for a zio patch made by [employer] like [facility] called cardiology about this as well.

## 2020-01-25 NOTE — Telephone Encounter (Signed)
[facility] calling with questions about zio patch - connected call to shelly wells

## 2020-01-27 NOTE — Telephone Encounter (Signed)
Left message for Rebecca [name] to return call to clinic to discuss.

## 2020-02-01 ENCOUNTER — Encounter: Payer: Self-pay | Admitting: Orthopedic Surgery

## 2020-02-01 ENCOUNTER — Non-Acute Institutional Stay (SKILLED_NURSING_FACILITY): Payer: Medicare Other | Admitting: Orthopedic Surgery

## 2020-02-01 DIAGNOSIS — M545 Low back pain, unspecified: Secondary | ICD-10-CM | POA: Diagnosis not present

## 2020-02-01 DIAGNOSIS — E0591 Thyrotoxicosis, unspecified with thyrotoxic crisis or storm: Secondary | ICD-10-CM

## 2020-02-01 DIAGNOSIS — G8929 Other chronic pain: Secondary | ICD-10-CM | POA: Diagnosis not present

## 2020-02-01 NOTE — Progress Notes (Signed)
Location:    [place] Room Number: 110/P Place of Service:  SNF [phone]) Provider:  Windell Moulding NP  Binnie Rail, MD  Patient Care Team: Binnie Rail, MD as PCP - General (Internal Medicine)  Extended Emergency Contact Information Primary Emergency Contact: Micki Riley Address: [address]          [place], [place] Phone: [phone] Relation: Son Secondary Emergency Contact: Erisha, Paugh Mobile Phone: [phone] Relation: Other  Code Status:  Full Code Goals of care: Advanced Directive information Advanced Directives 02/01/2020  Does Patient Have a Medical Advance Directive? Yes  Type of Advance Directive -  Does patient want to make changes to medical advance directive? No - Patient declined  Copy of [place] in Chart? -  Would patient like information on creating a medical advance directive? -     Chief Complaint  Patient presents with  . Acute Visit    Increased Back Pain    HPI:  Pt is a 86 y.o. female seen today for an acute visit for    Past Medical History:  Diagnosis Date  . Anemia, unspecified   . Ankle pain, left   . CAD (coronary artery disease)   . Esophageal reflux   . Fibrocystic breast disease   . Hiatal hernia   . Hypercholesteremia   . Hypertension   . IDDM (insulin dependent diabetes mellitus)    Type II  . Multinodular goiter   . Sickle-cell trait [facility])    Past Surgical History:  Procedure Laterality Date  . "FEMALE"  1976  . BACK SURGERY  1990  . BIOPSY THYROID     Dr.Ellison-Neg   . CORONARY ANGIOPLASTY    . FOOT SURGERY  2004  . RETINAL LASER PROCEDURE      Allergies  Allergen Reactions  . Codeine     "Makes me drunk"    Allergies as of 02/01/2020      Reactions   Codeine    "Makes me drunk"      Medication List       Accurate as of February 01, 2020  3:14 PM. If you have any questions, ask your nurse or doctor.          STOP taking these medications   Alcohol Swabs Pads Stopped by: Yvonna Alanis, NP   BD Veo Insulin Syringe U/F 31G X 15/64" 0.3 ML Misc Generic drug: Insulin Syringe-Needle U-100 Stopped by: Yvonna Alanis, NP   ONE TOUCH ULTRA 2 w/Device Kit Stopped by: Yvonna Alanis, NP   OneTouch Delica Lancets 18A Misc Stopped by: Yvonna Alanis, NP   OneTouch Ultra test strip Generic drug: glucose blood Stopped by: Yvonna Alanis, NP     TAKE these medications   Aspercreme 10 % Lotn Generic drug: Trolamine Salicylate Use twice daily as needed for right knee pain.   aspirin 81 MG tablet Take 81 mg by mouth daily.   atorvastatin 10 MG tablet Commonly known as: LIPITOR TAKE 1 TABLET BY MOUTH  DAILY AT 6 PM   brimonidine 0.2 % ophthalmic solution Commonly known as: ALPHAGAN Place 1 drop into the right eye 2 (two) times daily.   calcium carbonate 600 MG Tabs tablet Commonly known as: OS-CAL Take 600 mg by mouth daily.   Cod Liver Oil 1000 MG Caps Take 1,000 mg by mouth daily. During the winter   DORZOLAMIDE HCL-TIMOLOL MAL OP Place  1 drop into the right eye 2 (two) times daily.   gabapentin 300 MG capsule Commonly known as: NEURONTIN Take 300 mg by mouth daily.   Glucerna Liqd Take 237 mLs by mouth daily.   HumuLIN 70/30 KwikPen (70-30) 100 UNIT/ML KwikPen Generic drug: insulin isophane & regular human Inject 15 Units into the skin in the morning. What changed: Another medication with the same name was removed. Continue taking this medication, and follow the directions you see here. Changed by: Yvonna Alanis, NP   HumuLIN 70/30 KwikPen (70-30) 100 UNIT/ML KwikPen Generic drug: insulin isophane & regular human Inject 7-10 Units into the skin every evening. CBG 70-120=0 UNITS, 121-150-1UNIT, 151-200=2 UNITS, 201-250=3 UNITS, 251-300=5 UNITS, 301-350=7 UNITS, 351-400=9 UNITS, GREATER THAN 400 CALL PROVIDER AND GIVE 9 UNITS What changed: Another medication with the same name was  removed. Continue taking this medication, and follow the directions you see here. Changed by: Yvonna Alanis, NP   isosorbide mononitrate 30 MG 24 hr tablet Commonly known as: IMDUR TAKE 1 TABLET BY MOUTH  DAILY   latanoprost 0.005 % ophthalmic solution Commonly known as: XALATAN Place 1 drop into the right eye at bedtime.   lisinopril 20 MG tablet Commonly known as: ZESTRIL Take 1 tablet (20 mg total) by mouth daily.   methimazole 10 MG tablet Commonly known as: TAPAZOLE Take 10 mg by mouth 2 (two) times daily. What changed: Another medication with the same name was removed. Continue taking this medication, and follow the directions you see here. Changed by: Yvonna Alanis, NP   multivitamin tablet Take 1 tablet by mouth daily. Prn   nitroGLYCERIN 0.4 MG SL tablet Commonly known as: Nitrostat DISSOLVE ONE TABLET UNDER TONGUE AS DIRECTED FOR CHEST PAIN   omeprazole 20 MG capsule Commonly known as: PRILOSEC TAKE 1 CAPSULE BY MOUTH IN  THE MORNING   propranolol 20 MG tablet Commonly known as: INDERAL Take 1 tablet (20 mg total) by mouth 2 (two) times daily.   vitamin C 500 MG tablet Commonly known as: ASCORBIC ACID Take 500 mg by mouth 2 (two) times daily.       Review of Systems  Immunization History  Administered Date(s) Administered  . Fluad Quad(high Dose 65+) 11/24/2018, 01/08/2020  . Influenza Split 01/22/2011, 02/20/2012  . Influenza Whole 03/13/1999, 01/01/2008, 02/07/2009, 12/13/2009  . Influenza, High Dose Seasonal PF 02/07/2016, 02/04/2017, 01/27/2018  . Influenza,inj,Quad PF,6+ Mos 01/16/2013  . PFIZER SARS-COV-2 Vaccination 04/25/2019, 05/20/2019, 12/26/2019  . Pneumococcal Conjugate-13 07/14/2015  . Pneumococcal Polysaccharide-23 05/08/2006   Pertinent  Health Maintenance Due  Topic Date Due  . DEXA SCAN  Never done  . FOOT EXAM  11/11/2019  . HEMOGLOBIN A1C  07/03/2020  . OPHTHALMOLOGY EXAM  08/23/2020  . INFLUENZA VACCINE  Completed  . PNA vac  Low Risk Adult  Completed   Fall Risk  06/01/2019 11/24/2018 05/05/2018 04/03/2018 11/18/2017  Falls in the past year? 1 0 0 1 Yes  Number falls in past yr: 1 0 - 1 2 or more  Injury with Fall? 0 0 - 0 No  Risk Factor Category  - - - - High Fall Risk  Risk for fall due to : - Impaired balance/gait;Impaired mobility - History of fall(s);Impaired balance/gait;Impaired mobility Impaired mobility;Impaired balance/gait  Follow up - Falls prevention discussed - Falls evaluation completed Education provided;Falls prevention discussed   Functional Status Survey:    Vitals:   02/01/20 1459  BP: (!) 122/58  Pulse: 75  Resp: 18  Temp: (!) 97.3 F (36.3 C)  SpO2: 94%  Weight: 188 lb (85.3 kg)  Height: 5' 3" (1.6 m)   Body mass index is 33.3 kg/m. Physical Exam  Labs reviewed: Recent Labs    01/04/20 0528 01/04/20 0528 01/05/20 0505 01/05/20 0505 01/06/20 0443 01/06/20 0443 01/07/20 0410 01/07/20 0410 01/08/20 0421 01/11/20 0443 01/18/20 1511  NA 135   < > 135   < > 134*   < > 135   < > 139 137 131*  K 4.1   < > 4.2   < > 4.0   < > 3.6   < > 3.2* 4.5 4.7  CL 100   < > 98   < > 97*   < > 97*   < > 102 101 97  CO2 25   < > 26   < > 24   < > 25   < > _0 GLUCOSE 244*   < > 338*   < > 299*   < > 231*   < > 88 259* 152*  BUN 35*   < > 48*   < > 62*   < > 85*   < > 79* 43* 24*  CREATININE 0.96   < > 0.92   < > 0.97   < > 1.26*   < > 1.10* 1.02* 0.78  CALCIUM 9.0   < > 9.1   < > 9.0   < > 8.9   < > 8.8* 8.6* 9.1  MG 1.7   < > 1.8   < > 2.0   < > 2.2  --  2.3 2.3  --   PHOS 3.5  --  3.8  --  4.5  --   --   --   --   --   --    < > = values in this interval not displayed.   Recent Labs    01/06/20 0443 01/11/20 0443 01/18/20 1511  AST _1 ALT 18 32 21  ALKPHOS 63 68 88  BILITOT 0.4 1.0 0.6  PROT 5.8* 5.2* 5.7*  ALBUMIN 2.2* 2.1* 2.4*   Recent Labs    01/08/20 0421 01/11/20 0443 01/18/20 1511  WBC 10.9* 16.9* 12.6*  NEUTROABS 9.1* 12.5* 10.0*  HGB 11.1* 10.5*  9.6*  HCT 33.1* 32.3* 29.5*  MCV 76.1* 78.2* 76.7*  PLT 334 321 342.0   Lab Results  Component Value Date   TSH 0.02 (L) 01/18/2020   Lab Results  Component Value Date   HGBA1C 6.5 (H) 01/03/2020   Lab Results  Component Value Date   CHOL 112 01/03/2020   HDL 45 01/03/2020   LDLCALC 54 01/03/2020   TRIG 63 01/03/2020   CHOLHDL 2.5 01/03/2020    Significant Diagnostic Results in last 30 days:  MR BRAIN WO CONTRAST  Result Date: 01/03/2020 CLINICAL DATA:  Acute stroke suspected.  Neuro deficit. EXAM: MRI HEAD WITHOUT CONTRAST TECHNIQUE: Multiplanar, multiecho pulse sequences of the brain and surrounding structures were obtained without intravenous contrast. COMPARISON:  None. FINDINGS: Brain: No acute infarction, hemorrhage, hydrocephalus, extra-axial collection or mass lesion. Generalized cortical atrophy. Mild chronic small vessel ischemia in the periventricular white matter for age. Remote lacunar infarct at the right thalamus. Small remote right cerebellar infarct. Vascular: Preserved flow voids Skull and upper cervical spine: Normal marrow signal Sinuses/Orbits: Bilateral cataract resection IMPRESSION: 1. No acute finding, including infarct. 2. Cortical atrophy and chronic small vessel disease. Electronically Signed  By: Monte Fantasia M.D.   On: 01/03/2020 12:02   MR LUMBAR SPINE WO CONTRAST  Result Date: 01/03/2020 CLINICAL DATA:  Low back pain with bilateral leg weakness EXAM: MRI LUMBAR SPINE WITHOUT CONTRAST TECHNIQUE: Multiplanar, multisequence MR imaging of the lumbar spine was performed. No intravenous contrast was administered. COMPARISON:  X-ray 11/06/2018 FINDINGS: Segmentation: Suspect transitional lumbosacral anatomy with partial sacralization of the L5 segment. Grade 1 anterolisthesis is present at the L4-5 level, in keeping with numbering convention established on previous radiographs. Alignment:  5 mm grade 1 anterolisthesis L4 on L5. Vertebrae: No fracture,  evidence of discitis, or bone lesion. Mild diffuse intrinsic canal narrowing on the basis of congenitally short pedicles. Conus medullaris and cauda equina: Conus extends to the T12 level. Conus and cauda equina appear within normal limits. Paraspinal and other soft tissues: Cortically based T2 hyperintense lesionswithin the left kidney, incompletely characterized, but most likely represent cysts. Moderate distention of the urinary bladder is partially visualized. Disc levels: T11-T12: Sagittal sequences only. Moderate diffuse disc bulge with mild canal stenosis, moderate left and mild-to-moderate right foraminal stenosis. T12-L1: Sagittal sequences only. Mild circumferential disc bulge without evidence of foraminal or canal stenosis. L1-L2: Mild circumferential disc bulge with bilateral facet arthropathy and ligamentum flavum buckling resulting in mild-to-moderate canal stenosis with mild bilateral foraminal stenosis. L2-L3: Diffuse disc bulge with advanced bilateral facet arthropathy and ligamentum flavum buckling resulting in severe canal stenosis with moderate to severe right foraminal stenosis. L3-L4: Postsurgical changes to the left lamina. Mild diffuse disc bulge with bilateral facet arthropathy and ligamentum flavum buckling resulting in mild canal stenosis with moderate to severe right and mild-to-moderate left foraminal stenosis. L4-L5: Postsurgical changes to the left lamina. Disc uncovering with mild diffuse disc bulge. Bilateral facet arthropathy. No canal stenosis. Moderate bilateral foraminal stenosis, left worse than right. L5-S1: Mild posterior osteophytic spurring without foraminal or canal stenosis. IMPRESSION: 1. Multilevel degenerative changes of the lumbar spine superimposed on a congenitally narrow canal. Findings are most pronounced at the L2-3 level where there is severe canal stenosis and moderate-to-severe right foraminal stenosis. 2. Additional levels of mild-to-moderate canal stenosis,  as above. 3. Moderate-to-severe right foraminal stenosis at L3-4. 4. Moderate bilateral foraminal stenosis at L4-5. 5. Moderate distention of the urinary bladder. Correlate for urinary retention. Electronically Signed   By: Davina Poke D.O.   On: 01/03/2020 11:49   DG CHEST PORT 1 VIEW  Result Date: 01/05/2020 CLINICAL DATA:  Hypertension and lower extremity edema. Shortness of breath. EXAM: PORTABLE CHEST 1 VIEW COMPARISON:  January 04, 2020 FINDINGS: There is bibasilar atelectasis. There is subtle consolidation medial left base. Lungs elsewhere are clear. Heart is upper normal in size with pulmonary vascularity normal. No adenopathy. There is aortic atherosclerosis. No bone lesions. IMPRESSION: Subtle consolidation medial left base, likely developing pneumonia. There is also bibasilar atelectasis. Lungs elsewhere clear. Stable cardiac prominence. No adenopathy evident. Aortic Atherosclerosis (ICD10-I70.0). Electronically Signed   By: Lowella Grip III M.D.   On: 01/05/2020 08:47   DG CHEST PORT 1 VIEW  Result Date: 01/04/2020 CLINICAL DATA:  86 year old female with shortness of breath EXAM: PORTABLE CHEST 1 VIEW COMPARISON:  Prior chest x-ray 01/02/2020 FINDINGS: Increasing streaky airspace opacities in the left lower lobe partially obscuring the left hemidiaphragm. Stable cardiomegaly. Atherosclerotic calcifications again noted in the transverse aorta. No pulmonary edema, large pleural effusion or pneumothorax. Minimal atelectasis versus scarring in the right lung base, unchanged. No acute osseous abnormality. IMPRESSION: 1. Increasing streaky airspace opacities in  the left lower lobe which may reflect atelectasis and/or infiltrate. 2. Stable cardiomegaly. 3. Aortic calcifications. Electronically Signed   By: Jacqulynn Cadet M.D.   On: 01/04/2020 09:15   DG Chest Port 1 View  Result Date: 01/02/2020 CLINICAL DATA:  Weakness EXAM: PORTABLE CHEST 1 VIEW COMPARISON:  None. FINDINGS: The  heart size and mediastinal contours are mildly enlarged. Aortic knob calcifications are seen. There is prominence of the central pulmonary vasculature. Both lungs are clear. The visualized skeletal structures are unremarkable. IMPRESSION: Mild cardiomegaly and pulmonary vascular congestion. Electronically Signed   By: Prudencio Pair M.D.   On: 01/02/2020 23:18   ECHOCARDIOGRAM COMPLETE  Result Date: 01/03/2020    ECHOCARDIOGRAM REPORT   Patient Name:   ONEISHA AMMONS [facility] Date of Exam: 01/03/2020 Medical Rec #:  [id number]            Height:       63.0 in Accession #:    [id number]           Weight:       192.2 lb Date of Birth:  29-Apr-1932            BSA:          1.901 m Patient Age:    41 years             BP:           140/60 mmHg Patient Gender: F                    HR:           105 bpm. Exam Location:  Inpatient Procedure: 2D Echo, Color Doppler and Cardiac Doppler Indications:    I50.9* Heart failure (unspecified)  History:        Patient has no prior history of Echocardiogram examinations.                 CHF, CAD; Risk Factors:Hypertension, Diabetes, Dyslipidemia and                 Sickle Cell.  Sonographer:    Raquel Sarna Senior RDCS Referring Phys: [place]  Sonographer Comments: Technically difficult due to poor echo windows; patient unable to tolerate any probe pressure. IMPRESSIONS  1. Left ventricular ejection fraction, by estimation, is 60 to 65%. The left ventricle has normal function. The left ventricle has no regional wall motion abnormalities. There is mild asymmetric left ventricular hypertrophy of the basal-septal segment. Left ventricular diastolic parameters are consistent with Grade II diastolic dysfunction (pseudonormalization). Elevated left atrial pressure.  2. Right ventricular systolic function is normal. The right ventricular size is normal.  3. A small pericardial effusion is present. The pericardial effusion is circumferential. There is no evidence of cardiac tamponade.  4.  The mitral valve is grossly normal. Mild mitral valve regurgitation. No evidence of mitral stenosis.  5. The aortic valve is tricuspid. Aortic valve regurgitation is not visualized. No aortic stenosis is present. FINDINGS  Left Ventricle: Left ventricular ejection fraction, by estimation, is 60 to 65%. The left ventricle has normal function. The left ventricle has no regional wall motion abnormalities. The left ventricular internal cavity size was normal in size. There is  mild asymmetric left ventricular hypertrophy of the basal-septal segment. Left ventricular diastolic parameters are consistent with Grade II diastolic dysfunction (pseudonormalization). Elevated left atrial pressure. Right Ventricle: The right ventricular size is normal. No increase in right ventricular wall thickness. Right ventricular systolic function is normal.  Left Atrium: Left atrial size was normal in size. Right Atrium: Right atrial size was normal in size. Pericardium: A small pericardial effusion is present. The pericardial effusion is circumferential. There is no evidence of cardiac tamponade. Mitral Valve: The mitral valve is grossly normal. Mild mitral valve regurgitation. No evidence of mitral valve stenosis. Tricuspid Valve: The tricuspid valve is grossly normal. Tricuspid valve regurgitation is mild . No evidence of tricuspid stenosis. Aortic Valve: The aortic valve is tricuspid. Aortic valve regurgitation is not visualized. No aortic stenosis is present. Pulmonic Valve: The pulmonic valve was grossly normal. Pulmonic valve regurgitation is trivial. No evidence of pulmonic stenosis. Aorta: The aortic root and ascending aorta are structurally normal, with no evidence of dilitation. Venous: The inferior vena cava was not well visualized. IAS/Shunts: The atrial septum is grossly normal.  LEFT VENTRICLE PLAX 2D LVIDd:         4.00 cm  Diastology LVIDs:         3.00 cm  LV e' medial:    4.68 cm/s LV PW:         0.90 cm  LV E/e' medial:   15.9 LV IVS:        1.20 cm  LV e' lateral:   4.24 cm/s LVOT diam:     2.00 cm  LV E/e' lateral: 17.6 LV SV:         51 LV SV Index:   27 LVOT Area:     3.14 cm  RIGHT VENTRICLE RV S prime:     23.40 cm/s TAPSE (M-mode): 2.7 cm LEFT ATRIUM           Index       RIGHT ATRIUM           Index LA diam:      2.10 cm 1.10 cm/m  RA Area:     13.50 cm LA Vol (A2C): 23.8 ml 12.52 ml/m RA Volume:   29.90 ml  15.73 ml/m LA Vol (A4C): 48.8 ml 25.67 ml/m  AORTIC VALVE LVOT Vmax:   87.50 cm/s LVOT Vmean:  67.100 cm/s LVOT VTI:    0.162 m  AORTA Ao Root diam: 3.30 cm Ao Asc diam:  3.20 cm MITRAL VALVE                TRICUSPID VALVE MV Area (PHT): 4.96 cm     TR Peak grad:   42.0 mmHg MV Decel Time: 153 msec     TR Vmax:        324.00 cm/s MV E velocity: 74.60 cm/s MV A velocity: 105.00 cm/s  SHUNTS MV E/A ratio:  0.71         Systemic VTI:  0.16 m                             Systemic Diam: 2.00 cm Eleonore Chiquito MD Electronically signed by Eleonore Chiquito MD Signature Date/Time: 01/03/2020/10:31:23 AM    Final    US THYROID  Result Date: 01/08/2020 CLINICAL DATA:  Thyroid storm. EXAM: THYROID ULTRASOUND TECHNIQUE: Ultrasound examination of the thyroid gland and adjacent soft tissues was performed. COMPARISON:  06/20/2006 FINDINGS: Parenchymal Echotexture: Markedly heterogenous Isthmus: 1.2 Right lobe: 3.4 x 1.6 x 2.2 cm Left lobe: 4.8 x 2.2 x 2 cm _________________________________________________________ Estimated total number of nodules >/= 1 cm: 0 Number of spongiform nodules >/=  2 cm not described below (TR1): 0 Number of mixed cystic and solid nodules >/=  1.5 cm not described below (TR2): 0 _________________________________________________________ No discrete nodules are seen within the thyroid gland. IMPRESSION: Markedly heterogeneous thyroid gland without evidence for distinct thyroid nodule. The above is in keeping with the ACR TI-RADS recommendations - J Am Coll Radiol 2017;14:587-595. Electronically Signed   By:  Constance Holster M.D.   On: 01/08/2020 17:40   VAS [place] LOWER EXTREMITY VENOUS (DVT)  Result Date: 01/04/2020  Lower Venous DVTStudy Indications: Pain, and Swelling. Other Indications: Recent immobility. Anticoagulation: Lovenox. Limitations: Tissue properties, patient intolerant to probe pressure and unable to reposition. Comparison Study: No prior studies. Performing Technologist: Darlin Coco  Examination Guidelines: A complete evaluation includes B-mode imaging, spectral Doppler, color Doppler, and power Doppler as needed of all accessible portions of each vessel. Bilateral testing is considered an integral part of a complete examination. Limited examinations for reoccurring indications may be performed as noted. The reflux portion of the exam is performed with the patient in reverse Trendelenburg.  +---------+---------------+---------+-----------+----------+-------------------+ RIGHT    CompressibilityPhasicitySpontaneityPropertiesThrombus Aging      +---------+---------------+---------+-----------+----------+-------------------+ CFV      Full           Yes      Yes                                      +---------+---------------+---------+-----------+----------+-------------------+ SFJ      Full                                                             +---------+---------------+---------+-----------+----------+-------------------+ FV Prox  Full                                                             +---------+---------------+---------+-----------+----------+-------------------+ FV Mid   Full                                                             +---------+---------------+---------+-----------+----------+-------------------+ FV DistalFull                                                             +---------+---------------+---------+-----------+----------+-------------------+ PFV      Full                                                              +---------+---------------+---------+-----------+----------+-------------------+ POP                     Yes  Yes                                      +---------+---------------+---------+-----------+----------+-------------------+ PTV                                                   Patent by color-                                                          some segments not                                                         well visualized     +---------+---------------+---------+-----------+----------+-------------------+ PERO                                                  Patent by color-                                                          some segments not                                                         well visualized     +---------+---------------+---------+-----------+----------+-------------------+   +---------+---------------+---------+-----------+----------+--------------+ LEFT     CompressibilityPhasicitySpontaneityPropertiesThrombus Aging +---------+---------------+---------+-----------+----------+--------------+ CFV      Full           Yes      Yes                                 +---------+---------------+---------+-----------+----------+--------------+ SFJ      Full                                                        +---------+---------------+---------+-----------+----------+--------------+ FV Prox  Full                                                        +---------+---------------+---------+-----------+----------+--------------+ FV Mid   Full                                                        +---------+---------------+---------+-----------+----------+--------------+  FV Distal               Yes      Yes                                 +---------+---------------+---------+-----------+----------+--------------+ PFV      Full                                                         +---------+---------------+---------+-----------+----------+--------------+ POP                     Yes      Yes                                 +---------+---------------+---------+-----------+----------+--------------+ PTV                                                   Not visualized +---------+---------------+---------+-----------+----------+--------------+ PERO                                                  Not visualized +---------+---------------+---------+-----------+----------+--------------+     Summary: RIGHT: - There is no evidence of deep vein thrombosis in the lower extremity. However, portions of this examination were limited- see technologist comments above.  LEFT: - There is no evidence of deep vein thrombosis in the lower extremity. However, portions of this examination were limited- see technologist comments above.  - A cystic structure is found in the popliteal fossa.  *See table(s) above for measurements and observations. Electronically signed by Ruta Hinds MD on 01/04/2020 at 6:22:05 PM.    Final    VAS [place] UPPER EXTREMITY VENOUS DUPLEX  Result Date: 01/11/2020 UPPER VENOUS STUDY  Indications: Swelling Comparison Study: no prior Performing Technologist: Abram Sander RVS  Examination Guidelines: A complete evaluation includes B-mode imaging, spectral Doppler, color Doppler, and power Doppler as needed of all accessible portions of each vessel. Bilateral testing is considered an integral part of a complete examination. Limited examinations for reoccurring indications may be performed as noted.  Right Findings: +----------+------------+---------+-----------+----------+-------+ RIGHT     CompressiblePhasicitySpontaneousPropertiesSummary +----------+------------+---------+-----------+----------+-------+ IJV           Full       Yes       Yes                      +----------+------------+---------+-----------+----------+-------+ Subclavian     Full       Yes       Yes                      +----------+------------+---------+-----------+----------+-------+ Axillary      Full       Yes       Yes                      +----------+------------+---------+-----------+----------+-------+ Brachial  Full       Yes       Yes                      +----------+------------+---------+-----------+----------+-------+ Radial        Full                                          +----------+------------+---------+-----------+----------+-------+ Ulnar         Full                                          +----------+------------+---------+-----------+----------+-------+ Cephalic      Full                                          +----------+------------+---------+-----------+----------+-------+ Basilic       Full                                          +----------+------------+---------+-----------+----------+-------+  Summary:  Right: No evidence of deep vein thrombosis in the upper extremity. No evidence of superficial vein thrombosis in the upper extremity. No evidence of thrombosis in the subclavian.  *See table(s) above for measurements and observations.  Diagnosing physician: Ruta Hinds MD Electronically signed by Ruta Hinds MD on 01/11/2020 at 7:06:05 PM.    Final     Assessment/Plan There are no diagnoses linked to this encounter.   Family/ staff Communication:   Labs/tests ordered:

## 2020-02-01 NOTE — Progress Notes (Signed)
Location:  [place] Room Number: 110/P Place of Service:  SNF [phone]) Provider:  Windell Moulding, AGNP-C  Binnie Rail, MD  Patient Care Team: Binnie Rail, MD as PCP - General (Internal Medicine)  Extended Emergency Contact Information Primary Emergency Contact: Lenzen,Charlie L Address: [address]          [place], [place] Phone: [phone] Relation: Son Secondary Emergency Contact: [place], [place] Mobile Phone: [phone] Relation: Other   Goals of care: Advanced Directive information Advanced Directives 02/01/2020  Does Patient Have a Medical Advance Directive? Yes  Type of Advance Directive -  Does patient want to make changes to medical advance directive? No - Patient declined  Copy of [place] in Chart? -  Would patient like information on creating a medical advance directive? -     Chief Complaint  Patient presents with  . Acute Visit    Increased Back Pain    HPI:  Pt is a 86 y.o. female seen today for an acute visit for back pain.   She currently resides at [facility] requiring skilled nursing services, seen at bedside today. PMH includes: HTN, CAD without angina, chronic diastolic CHF, paroxysmal a-fib, diabetes, multinodular goiter, and thyroid storm.  She was hospitalized at [facility] from 01/02/2020- 01/11/2020 for thyroid storm. Since admission to [facility], she has been working with PT and OT on generalized weakness. Today, her physical therapist states her back pain is preventing her form having a better PT session. She was given 650 mg tylenol this morning prior to PT.   Prior to hospitalization, she lived at home and ambulated with a rollator. She has had chronic back pain for years. MRI of the back was completed during hospitalization resulting in severe spinal stenosis at L2-3. She was advised to follow up with neuro, but declined.  States she does not want surgery on her back at her age.   Today, she is very pleasant during examination. She is most upset about weakness, and states she is trying the best she can during PT sessions. Rates her back pain a 6/95 with certain movements. Transfers and getting out of bed cause the most pain. Takes gabapentin every night. Denies numbness/ tingling down legs. Denies loss of urine or bowel function.     Past Medical History:  Diagnosis Date  . Anemia, unspecified   . Ankle pain, left   . CAD (coronary artery disease)   . Esophageal reflux   . Fibrocystic breast disease   . Hiatal hernia   . Hypercholesteremia   . Hypertension   . IDDM (insulin dependent diabetes mellitus)    Type II  . Multinodular goiter   . Sickle-cell trait [facility])    Past Surgical History:  Procedure Laterality Date  . "FEMALE"  1976  . BACK SURGERY  1990  . BIOPSY THYROID     Dr.Ellison-Neg   . CORONARY ANGIOPLASTY    . FOOT SURGERY  2004  . RETINAL LASER PROCEDURE      Allergies  Allergen Reactions  . Codeine     "Makes me drunk"    Outpatient Encounter Medications as of 02/01/2020  Medication Sig  . Alcohol Swabs PADS Use as needed to clean puncture site.  . Ascorbic Acid (VITAMIN C) 500 MG tablet Take 500 mg by mouth 2 (two) times daily.    Marland Kitchen aspirin 81 MG tablet Take 81 mg by mouth  daily.    . atorvastatin (LIPITOR) 10 MG tablet TAKE 1 TABLET BY MOUTH  DAILY AT 6 PM  . Blood Glucose Monitoring Suppl (ONE TOUCH ULTRA 2) w/Device KIT Use to check blood sugars twice a day Dx e11.9  . brimonidine (ALPHAGAN) 0.2 % ophthalmic solution Place 1 drop into the right eye 2 (two) times daily.  . calcium carbonate (OS-CAL) 600 MG TABS Take 600 mg by mouth daily.   Marland Kitchen Cod Liver Oil 1000 MG CAPS Take 1,000 mg by mouth daily. During the winter  . DORZOLAMIDE HCL-TIMOLOL MAL OP Place 1 drop into the right eye 2 (two) times daily.  Marland Kitchen gabapentin (NEURONTIN) 300 MG capsule Take 300 mg by mouth daily.  .  Insulin Syringe-Needle U-100 (BD VEO INSULIN SYRINGE U/F) 31G X 15/64" 0.3 ML MISC USE TWICE DAILY TO INJECT  INSULIN  . isosorbide mononitrate (IMDUR) 30 MG 24 hr tablet TAKE 1 TABLET BY MOUTH  DAILY  . latanoprost (XALATAN) 0.005 % ophthalmic solution 1 drop at bedtime.  Marland Kitchen lisinopril (ZESTRIL) 20 MG tablet Take 1 tablet (20 mg total) by mouth daily.  . Multiple Vitamin (MULTIVITAMIN) tablet Take 1 tablet by mouth daily. Prn  . nitroGLYCERIN (NITROSTAT) 0.4 MG SL tablet DISSOLVE ONE TABLET UNDER TONGUE AS DIRECTED FOR CHEST PAIN  . omeprazole (PRILOSEC) 20 MG capsule TAKE 1 CAPSULE BY MOUTH IN  THE MORNING  . OneTouch Delica Lancets 62B MISC Use to check blood sugars 2 times daily. E11.9  . ONETOUCH ULTRA test strip USE 1 STRIP TO CHECK GLUCOSE TWICE DAILY  . propranolol (INDERAL) 20 MG tablet Take 1 tablet (20 mg total) by mouth 2 (two) times daily.  Loura Pardon Salicylate (ASPERCREME) 10 % LOTN Use twice daily as needed for right knee pain.  . [DISCONTINUED] insulin NPH-regular Human (HUMULIN 70/30) (70-30) 100 UNIT/ML injection Inject 15 units in the morning and 7-10 units in the evening  . [DISCONTINUED] methimazole (TAPAZOLE) 10 MG tablet Take 2 tablets (20 mg total) by mouth daily.   No facility-administered encounter medications on file as of 02/01/2020.    Review of Systems  Constitutional: Positive for activity change. Negative for appetite change and unexpected weight change.  Respiratory: Negative for cough and shortness of breath.   Cardiovascular: Negative for chest pain and leg swelling.  Musculoskeletal: Positive for arthralgias and back pain.  Neurological: Positive for weakness. Negative for numbness.    Immunization History  Administered Date(s) Administered  . Fluad Quad(high Dose 65+) 11/24/2018, 01/08/2020  . Influenza Split 01/22/2011, 02/20/2012  . Influenza Whole 03/13/1999, 01/01/2008, 02/07/2009, 12/13/2009  . Influenza, High Dose Seasonal PF 02/07/2016,  02/04/2017, 01/27/2018  . Influenza,inj,Quad PF,6+ Mos 01/16/2013  . PFIZER SARS-COV-2 Vaccination 04/25/2019, 05/20/2019, 12/26/2019  . Pneumococcal Conjugate-13 07/14/2015  . Pneumococcal Polysaccharide-23 05/08/2006   Pertinent  Health Maintenance Due  Topic Date Due  . DEXA SCAN  Never done  . FOOT EXAM  11/11/2019  . HEMOGLOBIN A1C  07/03/2020  . OPHTHALMOLOGY EXAM  08/23/2020  . INFLUENZA VACCINE  Completed  . PNA vac Low Risk Adult  Completed   Fall Risk  06/01/2019 11/24/2018 05/05/2018 04/03/2018 11/18/2017  Falls in the past year? 1 0 0 1 Yes  Number falls in past yr: 1 0 - 1 2 or more  Injury with Fall? 0 0 - 0 No  Risk Factor Category  - - - - High Fall Risk  Risk for fall due to : - Impaired balance/gait;Impaired mobility - History of fall(s);Impaired balance/gait;Impaired  mobility Impaired mobility;Impaired balance/gait  Follow up - Falls prevention discussed - Falls evaluation completed Education provided;Falls prevention discussed   Functional Status Survey:    Vitals:   02/01/20 1459  BP: (!) 122/58  Pulse: 75  Resp: 18  Temp: (!) 97.3 F (36.3 C)  SpO2: 94%  Weight: 188 lb (85.3 kg)  Height: '5\' 3"'  (1.6 m)   Body mass index is 33.3 kg/m. Physical Exam Vitals reviewed.  Constitutional:      Appearance: Normal appearance.  Neck:     Thyroid: Thyromegaly present. No thyroid mass.  Cardiovascular:     Rate and Rhythm: Normal rate. Rhythm irregular.     Pulses: Normal pulses.     Heart sounds: Normal heart sounds.  Pulmonary:     Effort: Pulmonary effort is normal. No respiratory distress.     Breath sounds: Normal breath sounds. No wheezing.  Musculoskeletal:     Lumbar back: Tenderness present. No swelling, deformity or signs of trauma. Normal range of motion.     Comments: Pain increased with forward flexion and twisting. Tenderness over L2-3 region.   Skin:    General: Skin is warm and dry.     Capillary Refill: Capillary refill takes less than 2  seconds.  Neurological:     General: No focal deficit present.     Mental Status: She is alert.     Motor: Weakness present.     Gait: Gait abnormal.  Psychiatric:        Mood and Affect: Mood normal.        Behavior: Behavior normal.        Thought Content: Thought content normal.        Judgment: Judgment normal.     Labs reviewed: Recent Labs    01/04/20 0528 01/04/20 0528 01/05/20 0505 01/05/20 0505 01/06/20 0443 01/06/20 0443 01/07/20 0410 01/07/20 0410 01/08/20 0421 01/11/20 0443 01/18/20 1511  NA 135   < > 135   < > 134*   < > 135   < > 139 137 131*  K 4.1   < > 4.2   < > 4.0   < > 3.6   < > 3.2* 4.5 4.7  CL 100   < > 98   < > 97*   < > 97*   < > 102 101 97  CO2 25   < > 26   < > 24   < > 25   < > '26 26 27  ' GLUCOSE 244*   < > 338*   < > 299*   < > 231*   < > 88 259* 152*  BUN 35*   < > 48*   < > 62*   < > 85*   < > 79* 43* 24*  CREATININE 0.96   < > 0.92   < > 0.97   < > 1.26*   < > 1.10* 1.02* 0.78  CALCIUM 9.0   < > 9.1   < > 9.0   < > 8.9   < > 8.8* 8.6* 9.1  MG 1.7   < > 1.8   < > 2.0   < > 2.2  --  2.3 2.3  --   PHOS 3.5  --  3.8  --  4.5  --   --   --   --   --   --    < > = values in this interval not displayed.   Recent Labs  01/06/20 0443 01/11/20 0443 01/18/20 1511  AST '16 23 18  ' ALT 18 32 21  ALKPHOS 63 68 88  BILITOT 0.4 1.0 0.6  PROT 5.8* 5.2* 5.7*  ALBUMIN 2.2* 2.1* 2.4*   Recent Labs    01/08/20 0421 01/11/20 0443 01/18/20 1511  WBC 10.9* 16.9* 12.6*  NEUTROABS 9.1* 12.5* 10.0*  HGB 11.1* 10.5* 9.6*  HCT 33.1* 32.3* 29.5*  MCV 76.1* 78.2* 76.7*  PLT 334 321 342.0   Lab Results  Component Value Date   TSH 0.02 (L) 01/18/2020   Lab Results  Component Value Date   HGBA1C 6.5 (H) 01/03/2020   Lab Results  Component Value Date   CHOL 112 01/03/2020   HDL 45 01/03/2020   LDLCALC 54 01/03/2020   TRIG 63 01/03/2020   CHOLHDL 2.5 01/03/2020    Significant Diagnostic Results in last 30 days:  MR BRAIN WO  CONTRAST  Result Date: 01/03/2020 CLINICAL DATA:  Acute stroke suspected.  Neuro deficit. EXAM: MRI HEAD WITHOUT CONTRAST TECHNIQUE: Multiplanar, multiecho pulse sequences of the brain and surrounding structures were obtained without intravenous contrast. COMPARISON:  None. FINDINGS: Brain: No acute infarction, hemorrhage, hydrocephalus, extra-axial collection or mass lesion. Generalized cortical atrophy. Mild chronic small vessel ischemia in the periventricular white matter for age. Remote lacunar infarct at the right thalamus. Small remote right cerebellar infarct. Vascular: Preserved flow voids Skull and upper cervical spine: Normal marrow signal Sinuses/Orbits: Bilateral cataract resection IMPRESSION: 1. No acute finding, including infarct. 2. Cortical atrophy and chronic small vessel disease. Electronically Signed   By: Monte Fantasia M.D.   On: 01/03/2020 12:02   MR LUMBAR SPINE WO CONTRAST  Result Date: 01/03/2020 CLINICAL DATA:  Low back pain with bilateral leg weakness EXAM: MRI LUMBAR SPINE WITHOUT CONTRAST TECHNIQUE: Multiplanar, multisequence MR imaging of the lumbar spine was performed. No intravenous contrast was administered. COMPARISON:  X-ray 11/06/2018 FINDINGS: Segmentation: Suspect transitional lumbosacral anatomy with partial sacralization of the L5 segment. Grade 1 anterolisthesis is present at the L4-5 level, in keeping with numbering convention established on previous radiographs. Alignment:  5 mm grade 1 anterolisthesis L4 on L5. Vertebrae: No fracture, evidence of discitis, or bone lesion. Mild diffuse intrinsic canal narrowing on the basis of congenitally short pedicles. Conus medullaris and cauda equina: Conus extends to the T12 level. Conus and cauda equina appear within normal limits. Paraspinal and other soft tissues: Cortically based T2 hyperintense lesionswithin the left kidney, incompletely characterized, but most likely represent cysts. Moderate distention of the urinary  bladder is partially visualized. Disc levels: T11-T12: Sagittal sequences only. Moderate diffuse disc bulge with mild canal stenosis, moderate left and mild-to-moderate right foraminal stenosis. T12-L1: Sagittal sequences only. Mild circumferential disc bulge without evidence of foraminal or canal stenosis. L1-L2: Mild circumferential disc bulge with bilateral facet arthropathy and ligamentum flavum buckling resulting in mild-to-moderate canal stenosis with mild bilateral foraminal stenosis. L2-L3: Diffuse disc bulge with advanced bilateral facet arthropathy and ligamentum flavum buckling resulting in severe canal stenosis with moderate to severe right foraminal stenosis. L3-L4: Postsurgical changes to the left lamina. Mild diffuse disc bulge with bilateral facet arthropathy and ligamentum flavum buckling resulting in mild canal stenosis with moderate to severe right and mild-to-moderate left foraminal stenosis. L4-L5: Postsurgical changes to the left lamina. Disc uncovering with mild diffuse disc bulge. Bilateral facet arthropathy. No canal stenosis. Moderate bilateral foraminal stenosis, left worse than right. L5-S1: Mild posterior osteophytic spurring without foraminal or canal stenosis. IMPRESSION: 1. Multilevel degenerative changes of the lumbar spine  superimposed on a congenitally narrow canal. Findings are most pronounced at the L2-3 level where there is severe canal stenosis and moderate-to-severe right foraminal stenosis. 2. Additional levels of mild-to-moderate canal stenosis, as above. 3. Moderate-to-severe right foraminal stenosis at L3-4. 4. Moderate bilateral foraminal stenosis at L4-5. 5. Moderate distention of the urinary bladder. Correlate for urinary retention. Electronically Signed   By: Davina Poke D.O.   On: 01/03/2020 11:49   DG CHEST PORT 1 VIEW  Result Date: 01/05/2020 CLINICAL DATA:  Hypertension and lower extremity edema. Shortness of breath. EXAM: PORTABLE CHEST 1 VIEW  COMPARISON:  January 04, 2020 FINDINGS: There is bibasilar atelectasis. There is subtle consolidation medial left base. Lungs elsewhere are clear. Heart is upper normal in size with pulmonary vascularity normal. No adenopathy. There is aortic atherosclerosis. No bone lesions. IMPRESSION: Subtle consolidation medial left base, likely developing pneumonia. There is also bibasilar atelectasis. Lungs elsewhere clear. Stable cardiac prominence. No adenopathy evident. Aortic Atherosclerosis (ICD10-I70.0). Electronically Signed   By: Lowella Grip III M.D.   On: 01/05/2020 08:47   DG CHEST PORT 1 VIEW  Result Date: 01/04/2020 CLINICAL DATA:  86 year old female with shortness of breath EXAM: PORTABLE CHEST 1 VIEW COMPARISON:  Prior chest x-ray 01/02/2020 FINDINGS: Increasing streaky airspace opacities in the left lower lobe partially obscuring the left hemidiaphragm. Stable cardiomegaly. Atherosclerotic calcifications again noted in the transverse aorta. No pulmonary edema, large pleural effusion or pneumothorax. Minimal atelectasis versus scarring in the right lung base, unchanged. No acute osseous abnormality. IMPRESSION: 1. Increasing streaky airspace opacities in the left lower lobe which may reflect atelectasis and/or infiltrate. 2. Stable cardiomegaly. 3. Aortic calcifications. Electronically Signed   By: Jacqulynn Cadet M.D.   On: 01/04/2020 09:15   DG Chest Port 1 View  Result Date: 01/02/2020 CLINICAL DATA:  Weakness EXAM: PORTABLE CHEST 1 VIEW COMPARISON:  None. FINDINGS: The heart size and mediastinal contours are mildly enlarged. Aortic knob calcifications are seen. There is prominence of the central pulmonary vasculature. Both lungs are clear. The visualized skeletal structures are unremarkable. IMPRESSION: Mild cardiomegaly and pulmonary vascular congestion. Electronically Signed   By: Prudencio Pair M.D.   On: 01/02/2020 23:18   ECHOCARDIOGRAM COMPLETE  Result Date: 01/03/2020     ECHOCARDIOGRAM REPORT   Patient Name:   LEVONNE CARRERAS [facility] Date of Exam: 01/03/2020 Medical Rec #:  [id number]            Height:       63.0 in Accession #:    [id number]           Weight:       192.2 lb Date of Birth:  [birth year]            BSA:          1.901 m Patient Age:    23 years             BP:           140/60 mmHg Patient Gender: F                    HR:           105 bpm. Exam Location:  Inpatient Procedure: 2D Echo, Color Doppler and Cardiac Doppler Indications:    I50.9* Heart failure (unspecified)  History:        Patient has no prior history of Echocardiogram examinations.                 CHF,  CAD; Risk Factors:Hypertension, Diabetes, Dyslipidemia and                 Sickle Cell.  Sonographer:    Raquel Sarna Senior RDCS Referring Phys: [place]  Sonographer Comments: Technically difficult due to poor echo windows; patient unable to tolerate any probe pressure. IMPRESSIONS  1. Left ventricular ejection fraction, by estimation, is 60 to 65%. The left ventricle has normal function. The left ventricle has no regional wall motion abnormalities. There is mild asymmetric left ventricular hypertrophy of the basal-septal segment. Left ventricular diastolic parameters are consistent with Grade II diastolic dysfunction (pseudonormalization). Elevated left atrial pressure.  2. Right ventricular systolic function is normal. The right ventricular size is normal.  3. A small pericardial effusion is present. The pericardial effusion is circumferential. There is no evidence of cardiac tamponade.  4. The mitral valve is grossly normal. Mild mitral valve regurgitation. No evidence of mitral stenosis.  5. The aortic valve is tricuspid. Aortic valve regurgitation is not visualized. No aortic stenosis is present. FINDINGS  Left Ventricle: Left ventricular ejection fraction, by estimation, is 60 to 65%. The left ventricle has normal function. The left ventricle has no regional wall motion abnormalities. The left  ventricular internal cavity size was normal in size. There is  mild asymmetric left ventricular hypertrophy of the basal-septal segment. Left ventricular diastolic parameters are consistent with Grade II diastolic dysfunction (pseudonormalization). Elevated left atrial pressure. Right Ventricle: The right ventricular size is normal. No increase in right ventricular wall thickness. Right ventricular systolic function is normal. Left Atrium: Left atrial size was normal in size. Right Atrium: Right atrial size was normal in size. Pericardium: A small pericardial effusion is present. The pericardial effusion is circumferential. There is no evidence of cardiac tamponade. Mitral Valve: The mitral valve is grossly normal. Mild mitral valve regurgitation. No evidence of mitral valve stenosis. Tricuspid Valve: The tricuspid valve is grossly normal. Tricuspid valve regurgitation is mild . No evidence of tricuspid stenosis. Aortic Valve: The aortic valve is tricuspid. Aortic valve regurgitation is not visualized. No aortic stenosis is present. Pulmonic Valve: The pulmonic valve was grossly normal. Pulmonic valve regurgitation is trivial. No evidence of pulmonic stenosis. Aorta: The aortic root and ascending aorta are structurally normal, with no evidence of dilitation. Venous: The inferior vena cava was not well visualized. IAS/Shunts: The atrial septum is grossly normal.  LEFT VENTRICLE PLAX 2D LVIDd:         4.00 cm  Diastology LVIDs:         3.00 cm  LV e' medial:    4.68 cm/s LV PW:         0.90 cm  LV E/e' medial:  15.9 LV IVS:        1.20 cm  LV e' lateral:   4.24 cm/s LVOT diam:     2.00 cm  LV E/e' lateral: 17.6 LV SV:         51 LV SV Index:   27 LVOT Area:     3.14 cm  RIGHT VENTRICLE RV S prime:     23.40 cm/s TAPSE (M-mode): 2.7 cm LEFT ATRIUM           Index       RIGHT ATRIUM           Index LA diam:      2.10 cm 1.10 cm/m  RA Area:     13.50 cm LA Vol (A2C): 23.8 ml 12.52 ml/m RA Volume:  29.90 ml  15.73  ml/m LA Vol (A4C): 48.8 ml 25.67 ml/m  AORTIC VALVE LVOT Vmax:   87.50 cm/s LVOT Vmean:  67.100 cm/s LVOT VTI:    0.162 m  AORTA Ao Root diam: 3.30 cm Ao Asc diam:  3.20 cm MITRAL VALVE                TRICUSPID VALVE MV Area (PHT): 4.96 cm     TR Peak grad:   42.0 mmHg MV Decel Time: 153 msec     TR Vmax:        324.00 cm/s MV E velocity: 74.60 cm/s MV A velocity: 105.00 cm/s  SHUNTS MV E/A ratio:  0.71         Systemic VTI:  0.16 m                             Systemic Diam: 2.00 cm Eleonore Chiquito MD Electronically signed by Eleonore Chiquito MD Signature Date/Time: 01/03/2020/10:31:23 AM    Final    US THYROID  Result Date: 01/08/2020 CLINICAL DATA:  Thyroid storm. EXAM: THYROID ULTRASOUND TECHNIQUE: Ultrasound examination of the thyroid gland and adjacent soft tissues was performed. COMPARISON:  06/20/2006 FINDINGS: Parenchymal Echotexture: Markedly heterogenous Isthmus: 1.2 Right lobe: 3.4 x 1.6 x 2.2 cm Left lobe: 4.8 x 2.2 x 2 cm _________________________________________________________ Estimated total number of nodules >/= 1 cm: 0 Number of spongiform nodules >/=  2 cm not described below (TR1): 0 Number of mixed cystic and solid nodules >/= 1.5 cm not described below (TR2): 0 _________________________________________________________ No discrete nodules are seen within the thyroid gland. IMPRESSION: Markedly heterogeneous thyroid gland without evidence for distinct thyroid nodule. The above is in keeping with the ACR TI-RADS recommendations - J Am Coll Radiol 2017;14:587-595. Electronically Signed   By: Constance Holster M.D.   On: 01/08/2020 17:40   VAS [place] LOWER EXTREMITY VENOUS (DVT)  Result Date: 01/04/2020  Lower Venous DVTStudy Indications: Pain, and Swelling. Other Indications: Recent immobility. Anticoagulation: Lovenox. Limitations: Tissue properties, patient intolerant to probe pressure and unable to reposition. Comparison Study: No prior studies. Performing Technologist: Darlin Coco   Examination Guidelines: A complete evaluation includes B-mode imaging, spectral Doppler, color Doppler, and power Doppler as needed of all accessible portions of each vessel. Bilateral testing is considered an integral part of a complete examination. Limited examinations for reoccurring indications may be performed as noted. The reflux portion of the exam is performed with the patient in reverse Trendelenburg.  +---------+---------------+---------+-----------+----------+-------------------+ RIGHT    CompressibilityPhasicitySpontaneityPropertiesThrombus Aging      +---------+---------------+---------+-----------+----------+-------------------+ CFV      Full           Yes      Yes                                      +---------+---------------+---------+-----------+----------+-------------------+ SFJ      Full                                                             +---------+---------------+---------+-----------+----------+-------------------+ FV Prox  Full                                                             +---------+---------------+---------+-----------+----------+-------------------+  FV Mid   Full                                                             +---------+---------------+---------+-----------+----------+-------------------+ FV DistalFull                                                             +---------+---------------+---------+-----------+----------+-------------------+ PFV      Full                                                             +---------+---------------+---------+-----------+----------+-------------------+ POP                     Yes      Yes                                      +---------+---------------+---------+-----------+----------+-------------------+ PTV                                                   Patent by color-                                                          some segments  not                                                         well visualized     +---------+---------------+---------+-----------+----------+-------------------+ PERO                                                  Patent by color-                                                          some segments not  well visualized     +---------+---------------+---------+-----------+----------+-------------------+   +---------+---------------+---------+-----------+----------+--------------+ LEFT     CompressibilityPhasicitySpontaneityPropertiesThrombus Aging +---------+---------------+---------+-----------+----------+--------------+ CFV      Full           Yes      Yes                                 +---------+---------------+---------+-----------+----------+--------------+ SFJ      Full                                                        +---------+---------------+---------+-----------+----------+--------------+ FV Prox  Full                                                        +---------+---------------+---------+-----------+----------+--------------+ FV Mid   Full                                                        +---------+---------------+---------+-----------+----------+--------------+ FV Distal               Yes      Yes                                 +---------+---------------+---------+-----------+----------+--------------+ PFV      Full                                                        +---------+---------------+---------+-----------+----------+--------------+ POP                     Yes      Yes                                 +---------+---------------+---------+-----------+----------+--------------+ PTV                                                   Not visualized +---------+---------------+---------+-----------+----------+--------------+ PERO                                                   Not visualized +---------+---------------+---------+-----------+----------+--------------+     Summary: RIGHT: - There is no evidence of deep vein thrombosis in the lower extremity. However, portions of this examination were limited- see technologist comments above.  LEFT: - There is no evidence of deep vein thrombosis in the lower extremity. However, portions of this examination were limited- see  technologist comments above.  - A cystic structure is found in the popliteal fossa.  *See table(s) above for measurements and observations. Electronically signed by Ruta Hinds MD on 01/04/2020 at 6:22:05 PM.    Final    VAS [place] UPPER EXTREMITY VENOUS DUPLEX  Result Date: 01/11/2020 UPPER VENOUS STUDY  Indications: Swelling Comparison Study: no prior Performing Technologist: Abram Sander RVS  Examination Guidelines: A complete evaluation includes B-mode imaging, spectral Doppler, color Doppler, and power Doppler as needed of all accessible portions of each vessel. Bilateral testing is considered an integral part of a complete examination. Limited examinations for reoccurring indications may be performed as noted.  Right Findings: +----------+------------+---------+-----------+----------+-------+ RIGHT     CompressiblePhasicitySpontaneousPropertiesSummary +----------+------------+---------+-----------+----------+-------+ IJV           Full       Yes       Yes                      +----------+------------+---------+-----------+----------+-------+ Subclavian    Full       Yes       Yes                      +----------+------------+---------+-----------+----------+-------+ Axillary      Full       Yes       Yes                      +----------+------------+---------+-----------+----------+-------+ Brachial      Full       Yes       Yes                      +----------+------------+---------+-----------+----------+-------+ Radial         Full                                          +----------+------------+---------+-----------+----------+-------+ Ulnar         Full                                          +----------+------------+---------+-----------+----------+-------+ Cephalic      Full                                          +----------+------------+---------+-----------+----------+-------+ Basilic       Full                                          +----------+------------+---------+-----------+----------+-------+  Summary:  Right: No evidence of deep vein thrombosis in the upper extremity. No evidence of superficial vein thrombosis in the upper extremity. No evidence of thrombosis in the subclavian.  *See table(s) above for measurements and observations.  Diagnosing physician: Ruta Hinds MD Electronically signed by Ruta Hinds MD on 01/11/2020 at 7:06:05 PM.    Final     Assessment/Plan 1. Thyrotoxicosis with thyrotoxic crisis, unspecified thyrotoxicosis type - suspect weakness may be related to thyroid condition and she may now have a hypoactive thyroid after treatment for thyroid storm - followed by Dr. Dwyane Dee  with endocrinology, last TSH done was 0.02, [place] was advised to reduce methimazole to 10 mg BID daily - advised to f/u in 4 weeks- I have asked facility nurse to confirm appointment  2. Chronic bilateral low back pain without sciatica - stable but increased with activity - will increase tylenol to 1000 mg PO BID PRN  - continue gabapentin regimen    Family/ staff Communication: Plan discussed with patient and facility nurse  Labs/tests ordered:  none

## 2020-02-04 ENCOUNTER — Other Ambulatory Visit: Payer: Self-pay

## 2020-02-04 ENCOUNTER — Inpatient Hospital Stay (HOSPITAL_COMMUNITY): Payer: Medicare Other

## 2020-02-04 ENCOUNTER — Emergency Department (HOSPITAL_COMMUNITY): Payer: Medicare Other

## 2020-02-04 ENCOUNTER — Encounter (HOSPITAL_COMMUNITY): Payer: Self-pay

## 2020-02-04 ENCOUNTER — Inpatient Hospital Stay (HOSPITAL_COMMUNITY)
Admission: EM | Admit: 2020-02-04 | Discharge: 2020-02-11 | DRG: 175 | Disposition: A | Discharge status: Skilled Nursing Facility | Payer: Medicare Other | Source: Skilled Nursing Facility | Attending: Internal Medicine | Admitting: Internal Medicine

## 2020-02-04 DIAGNOSIS — E785 Hyperlipidemia, unspecified: Secondary | ICD-10-CM | POA: Diagnosis present

## 2020-02-04 DIAGNOSIS — E669 Obesity, unspecified: Secondary | ICD-10-CM | POA: Diagnosis present

## 2020-02-04 DIAGNOSIS — I2692 Saddle embolus of pulmonary artery without acute cor pulmonale: Principal | ICD-10-CM | POA: Diagnosis present

## 2020-02-04 DIAGNOSIS — L89519 Pressure ulcer of right ankle, unspecified stage: Secondary | ICD-10-CM | POA: Diagnosis present

## 2020-02-04 DIAGNOSIS — R52 Pain, unspecified: Secondary | ICD-10-CM

## 2020-02-04 DIAGNOSIS — M6281 Muscle weakness (generalized): Secondary | ICD-10-CM | POA: Diagnosis not present

## 2020-02-04 DIAGNOSIS — M810 Age-related osteoporosis without current pathological fracture: Secondary | ICD-10-CM | POA: Diagnosis not present

## 2020-02-04 DIAGNOSIS — R195 Other fecal abnormalities: Secondary | ICD-10-CM | POA: Diagnosis present

## 2020-02-04 DIAGNOSIS — I480 Paroxysmal atrial fibrillation: Secondary | ICD-10-CM | POA: Diagnosis present

## 2020-02-04 DIAGNOSIS — M2550 Pain in unspecified joint: Secondary | ICD-10-CM | POA: Diagnosis not present

## 2020-02-04 DIAGNOSIS — R5381 Other malaise: Secondary | ICD-10-CM | POA: Diagnosis present

## 2020-02-04 DIAGNOSIS — I10 Essential (primary) hypertension: Secondary | ICD-10-CM | POA: Diagnosis not present

## 2020-02-04 DIAGNOSIS — R404 Transient alteration of awareness: Secondary | ICD-10-CM | POA: Diagnosis not present

## 2020-02-04 DIAGNOSIS — E1122 Type 2 diabetes mellitus with diabetic chronic kidney disease: Secondary | ICD-10-CM | POA: Diagnosis not present

## 2020-02-04 DIAGNOSIS — D649 Anemia, unspecified: Secondary | ICD-10-CM | POA: Diagnosis not present

## 2020-02-04 DIAGNOSIS — R579 Shock, unspecified: Secondary | ICD-10-CM | POA: Diagnosis present

## 2020-02-04 DIAGNOSIS — I499 Cardiac arrhythmia, unspecified: Secondary | ICD-10-CM | POA: Diagnosis not present

## 2020-02-04 DIAGNOSIS — I5032 Chronic diastolic (congestive) heart failure: Secondary | ICD-10-CM | POA: Diagnosis present

## 2020-02-04 DIAGNOSIS — I130 Hypertensive heart and chronic kidney disease with heart failure and stage 1 through stage 4 chronic kidney disease, or unspecified chronic kidney disease: Secondary | ICD-10-CM | POA: Diagnosis not present

## 2020-02-04 DIAGNOSIS — E7800 Pure hypercholesterolemia, unspecified: Secondary | ICD-10-CM | POA: Diagnosis present

## 2020-02-04 DIAGNOSIS — Z794 Long term (current) use of insulin: Secondary | ICD-10-CM

## 2020-02-04 DIAGNOSIS — Z20822 Contact with and (suspected) exposure to covid-19: Secondary | ICD-10-CM | POA: Diagnosis present

## 2020-02-04 DIAGNOSIS — M11261 Other chondrocalcinosis, right knee: Secondary | ICD-10-CM | POA: Diagnosis not present

## 2020-02-04 DIAGNOSIS — I2699 Other pulmonary embolism without acute cor pulmonale: Secondary | ICD-10-CM | POA: Diagnosis not present

## 2020-02-04 DIAGNOSIS — I2609 Other pulmonary embolism with acute cor pulmonale: Secondary | ICD-10-CM | POA: Diagnosis not present

## 2020-02-04 DIAGNOSIS — Z743 Need for continuous supervision: Secondary | ICD-10-CM | POA: Diagnosis not present

## 2020-02-04 DIAGNOSIS — D573 Sickle-cell trait: Secondary | ICD-10-CM | POA: Diagnosis present

## 2020-02-04 DIAGNOSIS — L89620 Pressure ulcer of left heel, unstageable: Secondary | ICD-10-CM | POA: Diagnosis not present

## 2020-02-04 DIAGNOSIS — R0902 Hypoxemia: Secondary | ICD-10-CM | POA: Diagnosis not present

## 2020-02-04 DIAGNOSIS — E119 Type 2 diabetes mellitus without complications: Secondary | ICD-10-CM | POA: Diagnosis not present

## 2020-02-04 DIAGNOSIS — E1165 Type 2 diabetes mellitus with hyperglycemia: Secondary | ICD-10-CM | POA: Diagnosis present

## 2020-02-04 DIAGNOSIS — I361 Nonrheumatic tricuspid (valve) insufficiency: Secondary | ICD-10-CM | POA: Diagnosis not present

## 2020-02-04 DIAGNOSIS — M7989 Other specified soft tissue disorders: Secondary | ICD-10-CM | POA: Diagnosis not present

## 2020-02-04 DIAGNOSIS — D62 Acute posthemorrhagic anemia: Secondary | ICD-10-CM | POA: Diagnosis present

## 2020-02-04 DIAGNOSIS — E873 Alkalosis: Secondary | ICD-10-CM | POA: Diagnosis not present

## 2020-02-04 DIAGNOSIS — E0591 Thyrotoxicosis, unspecified with thyrotoxic crisis or storm: Secondary | ICD-10-CM | POA: Diagnosis not present

## 2020-02-04 DIAGNOSIS — Z7982 Long term (current) use of aspirin: Secondary | ICD-10-CM

## 2020-02-04 DIAGNOSIS — M1711 Unilateral primary osteoarthritis, right knee: Secondary | ICD-10-CM | POA: Diagnosis not present

## 2020-02-04 DIAGNOSIS — E042 Nontoxic multinodular goiter: Secondary | ICD-10-CM | POA: Diagnosis not present

## 2020-02-04 DIAGNOSIS — M4800 Spinal stenosis, site unspecified: Secondary | ICD-10-CM | POA: Diagnosis not present

## 2020-02-04 DIAGNOSIS — I708 Atherosclerosis of other arteries: Secondary | ICD-10-CM | POA: Diagnosis not present

## 2020-02-04 DIAGNOSIS — R578 Other shock: Secondary | ICD-10-CM | POA: Diagnosis present

## 2020-02-04 DIAGNOSIS — I5033 Acute on chronic diastolic (congestive) heart failure: Secondary | ICD-10-CM | POA: Diagnosis not present

## 2020-02-04 DIAGNOSIS — L89529 Pressure ulcer of left ankle, unspecified stage: Secondary | ICD-10-CM | POA: Diagnosis present

## 2020-02-04 DIAGNOSIS — N1831 Chronic kidney disease, stage 3a: Secondary | ICD-10-CM | POA: Diagnosis present

## 2020-02-04 DIAGNOSIS — R55 Syncope and collapse: Secondary | ICD-10-CM | POA: Diagnosis not present

## 2020-02-04 DIAGNOSIS — Z6835 Body mass index (BMI) 35.0-35.9, adult: Secondary | ICD-10-CM

## 2020-02-04 DIAGNOSIS — L89610 Pressure ulcer of right heel, unstageable: Secondary | ICD-10-CM | POA: Diagnosis not present

## 2020-02-04 DIAGNOSIS — R570 Cardiogenic shock: Secondary | ICD-10-CM | POA: Diagnosis not present

## 2020-02-04 DIAGNOSIS — K219 Gastro-esophageal reflux disease without esophagitis: Secondary | ICD-10-CM | POA: Diagnosis present

## 2020-02-04 DIAGNOSIS — K449 Diaphragmatic hernia without obstruction or gangrene: Secondary | ICD-10-CM | POA: Diagnosis not present

## 2020-02-04 DIAGNOSIS — D509 Iron deficiency anemia, unspecified: Secondary | ICD-10-CM | POA: Diagnosis not present

## 2020-02-04 DIAGNOSIS — I2510 Atherosclerotic heart disease of native coronary artery without angina pectoris: Secondary | ICD-10-CM | POA: Diagnosis present

## 2020-02-04 DIAGNOSIS — Z955 Presence of coronary angioplasty implant and graft: Secondary | ICD-10-CM | POA: Diagnosis not present

## 2020-02-04 DIAGNOSIS — E0590 Thyrotoxicosis, unspecified without thyrotoxic crisis or storm: Secondary | ICD-10-CM | POA: Diagnosis present

## 2020-02-04 DIAGNOSIS — J90 Pleural effusion, not elsewhere classified: Secondary | ICD-10-CM | POA: Diagnosis not present

## 2020-02-04 DIAGNOSIS — Z79899 Other long term (current) drug therapy: Secondary | ICD-10-CM

## 2020-02-04 DIAGNOSIS — R569 Unspecified convulsions: Secondary | ICD-10-CM | POA: Diagnosis not present

## 2020-02-04 DIAGNOSIS — R6889 Other general symptoms and signs: Secondary | ICD-10-CM | POA: Diagnosis not present

## 2020-02-04 DIAGNOSIS — H409 Unspecified glaucoma: Secondary | ICD-10-CM | POA: Diagnosis not present

## 2020-02-04 DIAGNOSIS — K5900 Constipation, unspecified: Secondary | ICD-10-CM | POA: Diagnosis present

## 2020-02-04 DIAGNOSIS — G629 Polyneuropathy, unspecified: Secondary | ICD-10-CM | POA: Diagnosis not present

## 2020-02-04 DIAGNOSIS — I517 Cardiomegaly: Secondary | ICD-10-CM | POA: Diagnosis not present

## 2020-02-04 DIAGNOSIS — Z7401 Bed confinement status: Secondary | ICD-10-CM | POA: Diagnosis not present

## 2020-02-04 LAB — CBC WITH DIFFERENTIAL/PLATELET
Abs Immature Granulocytes: 0.55 10*3/uL — ABNORMAL HIGH (ref 0.00–0.07)
Basophils Absolute: 0.1 10*3/uL (ref 0.0–0.1)
Basophils Relative: 1 %
Eosinophils Absolute: 0.2 10*3/uL (ref 0.0–0.5)
Eosinophils Relative: 1 %
HCT: 31.1 % — ABNORMAL LOW (ref 36.0–46.0)
Hemoglobin: 9.7 g/dL — ABNORMAL LOW (ref 12.0–15.0)
Immature Granulocytes: 3 %
Lymphocytes Relative: 16 %
Lymphs Abs: 2.6 10*3/uL (ref 0.7–4.0)
MCH: 25.1 pg — ABNORMAL LOW (ref 26.0–34.0)
MCHC: 31.2 g/dL (ref 30.0–36.0)
MCV: 80.4 fL (ref 80.0–100.0)
Monocytes Absolute: 1.4 10*3/uL — ABNORMAL HIGH (ref 0.1–1.0)
Monocytes Relative: 9 %
Neutro Abs: 11.3 10*3/uL — ABNORMAL HIGH (ref 1.7–7.7)
Neutrophils Relative %: 70 %
Platelets: 368 10*3/uL (ref 150–400)
RBC: 3.87 MIL/uL (ref 3.87–5.11)
RDW: 18.2 % — ABNORMAL HIGH (ref 11.5–15.5)
WBC: 16.1 10*3/uL — ABNORMAL HIGH (ref 4.0–10.5)
nRBC: 0.1 % (ref 0.0–0.2)

## 2020-02-04 LAB — COMPREHENSIVE METABOLIC PANEL
ALT: 11 U/L (ref 0–44)
AST: 20 U/L (ref 15–41)
Albumin: 1.8 g/dL — ABNORMAL LOW (ref 3.5–5.0)
Alkaline Phosphatase: 93 U/L (ref 38–126)
Anion gap: 15 (ref 5–15)
BUN: 18 mg/dL (ref 8–23)
CO2: 19 mmol/L — ABNORMAL LOW (ref 22–32)
Calcium: 8.8 mg/dL — ABNORMAL LOW (ref 8.9–10.3)
Chloride: 100 mmol/L (ref 98–111)
Creatinine, Ser: 1.22 mg/dL — ABNORMAL HIGH (ref 0.44–1.00)
GFR, Estimated: 43 mL/min — ABNORMAL LOW (ref >60)
Glucose, Bld: 209 mg/dL — ABNORMAL HIGH (ref 70–99)
Potassium: 5.2 mmol/L — ABNORMAL HIGH (ref 3.5–5.1)
Sodium: 134 mmol/L — ABNORMAL LOW (ref 135–145)
Total Bilirubin: 0.7 mg/dL (ref 0.3–1.2)
Total Protein: 5.7 g/dL — ABNORMAL LOW (ref 6.5–8.1)

## 2020-02-04 LAB — PROTIME-INR
INR: 1.2 (ref 0.8–1.2)
INR: 2 — ABNORMAL HIGH (ref 0.8–1.2)
Prothrombin Time: 14.7 seconds (ref 11.4–15.2)
Prothrombin Time: 22.3 seconds — ABNORMAL HIGH (ref 11.4–15.2)

## 2020-02-04 LAB — I-STAT VENOUS BLOOD GAS, ED
Acid-base deficit: 4 mmol/L — ABNORMAL HIGH (ref 0.0–2.0)
Bicarbonate: 21.7 mmol/L (ref 20.0–28.0)
Calcium, Ion: 1.16 mmol/L (ref 1.15–1.40)
HCT: 30 % — ABNORMAL LOW (ref 36.0–46.0)
Hemoglobin: 10.2 g/dL — ABNORMAL LOW (ref 12.0–15.0)
O2 Saturation: 76 %
Potassium: 5.2 mmol/L — ABNORMAL HIGH (ref 3.5–5.1)
Sodium: 134 mmol/L — ABNORMAL LOW (ref 135–145)
TCO2: 23 mmol/L (ref 22–32)
pCO2, Ven: 41.8 mmHg — ABNORMAL LOW (ref 44.0–60.0)
pH, Ven: 7.324 (ref 7.250–7.430)
pO2, Ven: 44 mmHg (ref 32.0–45.0)

## 2020-02-04 LAB — POCT I-STAT 7, (LYTES, BLD GAS, ICA,H+H)
Acid-Base Excess: 8 mmol/L — ABNORMAL HIGH (ref 0.0–2.0)
Bicarbonate: 31.3 mmol/L — ABNORMAL HIGH (ref 20.0–28.0)
Calcium, Ion: 1.32 mmol/L (ref 1.15–1.40)
HCT: 23 % — ABNORMAL LOW (ref 36.0–46.0)
Hemoglobin: 7.8 g/dL — ABNORMAL LOW (ref 12.0–15.0)
O2 Saturation: 93 %
Patient temperature: 98.6
Potassium: 4.5 mmol/L (ref 3.5–5.1)
Sodium: 140 mmol/L (ref 135–145)
TCO2: 32 mmol/L (ref 22–32)
pCO2 arterial: 34.9 mmHg (ref 32.0–48.0)
pH, Arterial: 7.561 — ABNORMAL HIGH (ref 7.350–7.450)
pO2, Arterial: 56 mmHg — ABNORMAL LOW (ref 83.0–108.0)

## 2020-02-04 LAB — CBC
HCT: 24.7 % — ABNORMAL LOW (ref 36.0–46.0)
Hemoglobin: 8.1 g/dL — ABNORMAL LOW (ref 12.0–15.0)
MCH: 25.2 pg — ABNORMAL LOW (ref 26.0–34.0)
MCHC: 32.8 g/dL (ref 30.0–36.0)
MCV: 76.9 fL — ABNORMAL LOW (ref 80.0–100.0)
Platelets: 254 10*3/uL (ref 150–400)
RBC: 3.21 MIL/uL — ABNORMAL LOW (ref 3.87–5.11)
RDW: 17.9 % — ABNORMAL HIGH (ref 11.5–15.5)
WBC: 15.2 10*3/uL — ABNORMAL HIGH (ref 4.0–10.5)
nRBC: 0.3 % — ABNORMAL HIGH (ref 0.0–0.2)

## 2020-02-04 LAB — BASIC METABOLIC PANEL
Anion gap: 14 (ref 5–15)
BUN: 19 mg/dL (ref 8–23)
CO2: 26 mmol/L (ref 22–32)
Calcium: 9.4 mg/dL (ref 8.9–10.3)
Chloride: 100 mmol/L (ref 98–111)
Creatinine, Ser: 1.1 mg/dL — ABNORMAL HIGH (ref 0.44–1.00)
GFR, Estimated: 49 mL/min — ABNORMAL LOW (ref >60)
Glucose, Bld: 227 mg/dL — ABNORMAL HIGH (ref 70–99)
Potassium: 4.4 mmol/L (ref 3.5–5.1)
Sodium: 140 mmol/L (ref 135–145)

## 2020-02-04 LAB — GLUCOSE, CAPILLARY
Glucose-Capillary: 192 mg/dL — ABNORMAL HIGH (ref 70–99)
Glucose-Capillary: 232 mg/dL — ABNORMAL HIGH (ref 70–99)
Glucose-Capillary: 187 mg/dL — ABNORMAL HIGH (ref 70–99)

## 2020-02-04 LAB — TSH: TSH: 1.57 u[IU]/mL (ref 0.350–4.500)

## 2020-02-04 LAB — MRSA PCR SCREENING: MRSA by PCR: NEGATIVE

## 2020-02-04 LAB — ABO/RH: ABO/RH(D): A POS

## 2020-02-04 LAB — TYPE AND SCREEN
ABO/RH(D): A POS
Antibody Screen: NEGATIVE

## 2020-02-04 LAB — CBG MONITORING, ED: Glucose-Capillary: 184 mg/dL — ABNORMAL HIGH (ref 70–99)

## 2020-02-04 LAB — TROPONIN I (HIGH SENSITIVITY)
Troponin I (High Sensitivity): 43 ng/L — ABNORMAL HIGH (ref <18)
Troponin I (High Sensitivity): 37 ng/L — ABNORMAL HIGH (ref <18)

## 2020-02-04 LAB — APTT
aPTT: 25 seconds (ref 24–36)
aPTT: 59 seconds — ABNORMAL HIGH (ref 24–36)

## 2020-02-04 LAB — MAGNESIUM: Magnesium: 1.9 mg/dL (ref 1.7–2.4)

## 2020-02-04 LAB — HEMOGLOBIN AND HEMATOCRIT, BLOOD
HCT: 27.9 % — ABNORMAL LOW (ref 36.0–46.0)
Hemoglobin: 9 g/dL — ABNORMAL LOW (ref 12.0–15.0)

## 2020-02-04 LAB — LACTIC ACID, PLASMA
Lactic Acid, Venous: 4.4 mmol/L (ref 0.5–1.9)
Lactic Acid, Venous: 3.8 mmol/L (ref 0.5–1.9)

## 2020-02-04 LAB — BRAIN NATRIURETIC PEPTIDE: B Natriuretic Peptide: 547.5 pg/mL — ABNORMAL HIGH (ref 0.0–100.0)

## 2020-02-04 LAB — RESP PANEL BY RT-PCR (FLU A&B, COVID) ARPGX2
Influenza A by PCR: NEGATIVE
Influenza B by PCR: NEGATIVE
SARS Coronavirus 2 by RT PCR: NEGATIVE

## 2020-02-04 LAB — T4, FREE: Free T4: 1.28 ng/dL — ABNORMAL HIGH (ref 0.61–1.12)

## 2020-02-04 LAB — PHOSPHORUS: Phosphorus: 4 mg/dL (ref 2.5–4.6)

## 2020-02-04 MED ORDER — HEPARIN (PORCINE) 25000 UT/250ML-% IV SOLN
850.0000 [IU]/h | INTRAVENOUS | Status: DC
  Administered 2020-02-04 – 2020-02-06 (×2): 850 [IU]/h via INTRAVENOUS
  Filled 2020-02-04 (×2): qty 250

## 2020-02-04 MED ORDER — ORAL CARE MOUTH RINSE
15.0000 mL | Freq: Two times a day (BID) | OROMUCOSAL | Status: DC
  Administered 2020-02-06 – 2020-02-11 (×9): 15 mL via OROMUCOSAL

## 2020-02-04 MED ORDER — VANCOMYCIN HCL 1750 MG/350ML IV SOLN
1750.0000 mg | Freq: Once | INTRAVENOUS | Status: DC
  Filled 2020-02-04: qty 350

## 2020-02-04 MED ORDER — STERILE WATER FOR INJECTION IV SOLN
INTRAVENOUS | Status: DC
  Filled 2020-02-04: qty 850

## 2020-02-04 MED ORDER — NOREPINEPHRINE 4 MG/250ML-% IV SOLN
0.0000 ug/min | INTRAVENOUS | Status: DC
  Administered 2020-02-04: 5 ug/min via INTRAVENOUS
  Administered 2020-02-04: 10 ug/min via INTRAVENOUS
  Administered 2020-02-04 – 2020-02-05 (×2): 5 ug/min via INTRAVENOUS
  Administered 2020-02-05: 4 ug/min via INTRAVENOUS
  Filled 2020-02-04 (×3): qty 250

## 2020-02-04 MED ORDER — SODIUM CHLORIDE 0.9 % IV SOLN
2.0000 g | Freq: Once | INTRAVENOUS | Status: DC
  Administered 2020-02-04: 2 g via INTRAVENOUS
  Filled 2020-02-04: qty 20

## 2020-02-04 MED ORDER — VANCOMYCIN HCL 1250 MG/250ML IV SOLN: 1250.0000 mg | INTRAVENOUS | Status: DC

## 2020-02-04 MED ORDER — LEVETIRACETAM IN NACL 1000 MG/100ML IV SOLN
1000.0000 mg | Freq: Once | INTRAVENOUS | Status: AC
  Administered 2020-02-04: 1000 mg via INTRAVENOUS
  Filled 2020-02-04: qty 100

## 2020-02-04 MED ORDER — INSULIN ASPART 100 UNIT/ML ~~LOC~~ SOLN
0.0000 [IU] | SUBCUTANEOUS | Status: DC
  Administered 2020-02-04 (×2): 3 [IU] via SUBCUTANEOUS
  Administered 2020-02-04: 5 [IU] via SUBCUTANEOUS
  Administered 2020-02-05 (×2): 2 [IU] via SUBCUTANEOUS

## 2020-02-04 MED ORDER — ALTEPLASE (PULMONARY EMBOLISM) INFUSION
100.0000 mg | Freq: Once | INTRAVENOUS | Status: AC
  Administered 2020-02-04: 100 mg via INTRAVENOUS
  Filled 2020-02-04: qty 100

## 2020-02-04 MED ORDER — PANTOPRAZOLE SODIUM 40 MG IV SOLR
40.0000 mg | INTRAVENOUS | Status: DC
  Administered 2020-02-04 – 2020-02-05 (×2): 40 mg via INTRAVENOUS
  Filled 2020-02-04 (×2): qty 40

## 2020-02-04 MED ORDER — INSULIN ASPART 100 UNIT/ML ~~LOC~~ SOLN: 1.0000 [IU] | SUBCUTANEOUS | Status: DC

## 2020-02-04 MED ORDER — DOCUSATE SODIUM 100 MG PO CAPS: 100.0000 mg | ORAL_CAPSULE | Freq: Two times a day (BID) | ORAL | Status: DC | PRN

## 2020-02-04 MED ORDER — SODIUM CHLORIDE 0.9 % IV SOLN: 250.0000 mL | Freq: Once | INTRAVENOUS | Status: DC

## 2020-02-04 MED ORDER — IOHEXOL 350 MG/ML SOLN
100.0000 mL | Freq: Once | INTRAVENOUS | Status: AC | PRN
  Administered 2020-02-04: 100 mL via INTRAVENOUS

## 2020-02-04 MED ORDER — SODIUM CHLORIDE 0.9 % IV BOLUS
1000.0000 mL | Freq: Once | INTRAVENOUS | Status: AC
  Administered 2020-02-04: 1000 mL via INTRAVENOUS

## 2020-02-04 MED ORDER — EPINEPHRINE HCL 5 MG/250ML IV SOLN IN NS: 0.5000 ug/min | INTRAVENOUS | Status: DC

## 2020-02-04 MED ORDER — SODIUM CHLORIDE 0.9 % IV SOLN: 2.0000 g | Freq: Two times a day (BID) | INTRAVENOUS | Status: DC

## 2020-02-04 MED ORDER — CHLORHEXIDINE GLUCONATE 0.12 % MT SOLN
15.0000 mL | Freq: Two times a day (BID) | OROMUCOSAL | Status: DC
  Administered 2020-02-04 – 2020-02-11 (×13): 15 mL via OROMUCOSAL
  Filled 2020-02-04 (×11): qty 15

## 2020-02-04 MED ORDER — ALTEPLASE (PULMONARY EMBOLISM) INFUSION
100.0000 mg | Freq: Once | INTRAVENOUS | Status: DC
  Filled 2020-02-04: qty 100

## 2020-02-04 MED ORDER — ALBUMIN HUMAN 25 % IV SOLN: 50.0000 g | Freq: Once | INTRAVENOUS | Status: DC

## 2020-02-04 MED ORDER — POLYETHYLENE GLYCOL 3350 17 G PO PACK
17.0000 g | PACK | Freq: Every day | ORAL | Status: DC | PRN
  Administered 2020-02-10: 17 g via ORAL
  Filled 2020-02-04: qty 1

## 2020-02-04 MED ORDER — ONDANSETRON HCL 4 MG/2ML IJ SOLN
INTRAMUSCULAR | Status: AC
  Administered 2020-02-04: 4 mg
  Filled 2020-02-04: qty 2

## 2020-02-04 MED ORDER — VASOPRESSIN 20 UNITS/100 ML INFUSION FOR SHOCK
0.0000 [IU]/min | INTRAVENOUS | Status: DC
  Administered 2020-02-04: 0.03 [IU]/min via INTRAVENOUS
  Filled 2020-02-04: qty 100

## 2020-02-04 MED ORDER — SODIUM BICARBONATE 8.4 % IV SOLN
INTRAVENOUS | Status: AC
  Administered 2020-02-04: 50 meq
  Filled 2020-02-04: qty 100

## 2020-02-04 NOTE — ED Provider Notes (Addendum)
[facility] 50[facility] MEDICAL ICU Provider Note   CSN: [id number] Arrival date & time: 02/04/20  1310     History Chief Complaint  Patient presents with  . Near Syncope    Rebecca [name] is a 86 y.o. female.  HPI    86 year old female comes in a chief complaint of syncope and seizure-like activity. She has history of CAD, diabetes and was recently discharged from the hospital after she had thyroid storm and E. coli UTI.  She is currently residing at a nursing home.  I spoke with patient's son who is now at the bedside.  He reports that he went to visit his mother and noted that she was having staring spells.  He witnessed 2 spells himself.  He notified the staff at the facility who decided to call EMS.  Per EMS patient had 1 more episode and route along with patient having PVCs and diminished pulse.  She has been hypotensive and has received 100 cc of fluid.  There was another episode that occurred once patient arrived.  Patient states that she feels weak and unwell.  She denies any chest pain, shortness of breath, UTI-like symptoms, cough, fevers, chills.  She is having some abdominal discomfort.  During my assessment she did have an episode of dizziness as well.  Past Medical History:  Diagnosis Date  . Anemia, unspecified   . Ankle pain, left   . CAD (coronary artery disease)   . Esophageal reflux   . Fibrocystic breast disease   . Hiatal hernia   . Hypercholesteremia   . Hypertension   . IDDM (insulin dependent diabetes mellitus)    Type II  . Multinodular goiter   . Sickle-cell trait [facility])     Patient Active Problem List   Diagnosis Date Noted  . Shock (HCC) 02/04/2020  . Paroxysmal A-fib (HCC) 01/12/2020  . Thyroid storm 01/06/2020  . Spinal stenosis 01/06/2020  . Chronic kidney disease, stage 3a (HCC) 01/03/2020  . Microcytic anemia 01/03/2020  . Acute on chronic diastolic CHF (congestive heart failure) (HCC) 01/03/2020  . Physical  deconditioning 01/03/2020  . Generalized osteoarthritis of hand 06/01/2019  . Pressure sore, right foot 06/01/2019  . Peripheral neuropathy 06/01/2019  . Degenerative arthritis of right knee 12/15/2018  . Knee osteoarthritis 09/08/2018  . Pes planus 12/01/2017  . Foot deformity, acquired, left 11/04/2017  . Degenerative disc disease, lumbar 08/01/2016  . Glaucoma 07/23/2016  . Numbness 04/23/2016  . Chronic pain of right knee 12/28/2015  . Lower back pain 10/24/2015  . Pain in both feet 10/24/2015  . Abnormality of gait 04/01/2013  . Posterior tibial tendon dysfunction 04/01/2013  . Diabetes (HCC) 04/01/2013  . Ankle pain, left 10/20/2010  . UNSPECIFIED VITAMIN D DEFICIENCY 11/08/2008  . EDEMA- LOCALIZED 11/08/2008  . Sickle-cell trait (HCC) 01/01/2008  . Esophageal reflux 09/02/2007  . GOITER, MULTINODULAR 11/26/2006  . Coronary artery disease involving native coronary artery of native heart without angina pectoris 11/26/2006  . HIATAL HERNIA 11/26/2006  . FIBROCYSTIC BREAST DISEASE 07/31/2006  . HYPERCHOLESTEROLEMIA 05/27/2006  . Essential hypertension 05/27/2006    Past Surgical History:  Procedure Laterality Date  . "FEMALE"  1976  . BACK SURGERY  1990  . BIOPSY THYROID     Dr.Ellison-Neg   . CORONARY ANGIOPLASTY    . FOOT SURGERY  2004  . RETINAL LASER PROCEDURE       OB History   No obstetric history on file.     Family History  Problem Relation Age of Onset  . Hypertension Other   . Lung cancer Other   . Heart disease Other   . Gout Sister   . Gout Brother   . Thyroid disease Brother   . Heart attack Father   . Heart attack Mother   . Diabetes Mother   . Gout Brother     Social History   Tobacco Use  . Smoking status: Never Smoker  . Smokeless tobacco: Never Used  Vaping Use  . Vaping Use: Never used  Substance Use Topics  . Alcohol use: No  . Drug use: No    Home Medications Prior to Admission medications   Medication Sig Start Date  End Date Taking? Authorizing Provider  Ascorbic Acid (VITAMIN C) 500 MG tablet Take 500 mg by mouth 2 (two) times daily.      [provider]  aspirin 81 MG tablet Take 81 mg by mouth daily.      [provider]  atorvastatin (LIPITOR) 10 MG tablet TAKE 1 TABLET BY MOUTH  DAILY AT 6 PM 11/30/19   Burns, [name], MD  brimonidine (ALPHAGAN) 0.2 % ophthalmic solution Place 1 drop into the right eye 2 (two) times daily. 03/02/19   [provider]  calcium carbonate (OS-CAL) 600 MG TABS Take 600 mg by mouth daily.     [provider]  Cod Liver Oil 1000 MG CAPS Take 1,000 mg by mouth daily. During the winter    [provider]  DORZOLAMIDE HCL-TIMOLOL MAL OP Place 1 drop into the right eye 2 (two) times daily.    [provider]  gabapentin (NEURONTIN) 300 MG capsule Take 300 mg by mouth daily.    [provider]  Glucerna (GLUCERNA) LIQD Take 237 mLs by mouth daily. 01/22/20   [provider]  insulin isophane & regular human (HUMULIN 70/30 KWIKPEN) (70-30) 100 UNIT/ML KwikPen Inject 15 Units into the skin in the morning. 01/11/20   [provider]  insulin isophane & regular human (HUMULIN 70/30 KWIKPEN) (70-30) 100 UNIT/ML KwikPen Inject 7-10 Units into the skin every evening. CBG 70-120=0 UNITS, 121-150-1UNIT, 151-200=2 UNITS, 201-250=3 UNITS, 251-300=5 UNITS, 301-350=7 UNITS, 351-400=9 UNITS, GREATER THAN 400 CALL PROVIDER AND GIVE 9 UNITS 01/11/20   [provider]  isosorbide mononitrate (IMDUR) 30 MG 24 hr tablet TAKE 1 TABLET BY MOUTH  DAILY 01/06/20   Burns, [name], MD  latanoprost (XALATAN) 0.005 % ophthalmic solution Place 1 drop into the right eye at bedtime.  01/11/20   [provider]  lisinopril (ZESTRIL) 20 MG tablet Take 1 tablet (20 mg total) by mouth daily. 01/12/20   Lewie Chamber, MD  methimazole (TAPAZOLE) 10 MG tablet Take 10 mg by mouth 2 (two) times daily. 01/20/20   [provider]  Multiple Vitamin (MULTIVITAMIN) tablet Take 1 tablet by mouth daily. Prn    [provider]  nitroGLYCERIN (NITROSTAT) 0.4 MG SL tablet DISSOLVE ONE TABLET UNDER TONGUE AS DIRECTED FOR CHEST PAIN 08/05/13   Laurey Morale, MD  omeprazole (PRILOSEC) 20 MG capsule TAKE 1 CAPSULE BY MOUTH IN  THE MORNING 11/13/19   Burns, [name], MD  propranolol (INDERAL) 20 MG tablet Take 1 tablet (20 mg total) by mouth 2 (two) times daily. 01/11/20   Lewie Chamber, MD  Trolamine Salicylate (ASPERCREME) 10 % LOTN Use twice daily as needed for right knee pain.    [provider]    Allergies    Codeine  Review of Systems   Review of Systems  Constitutional: Positive for activity change.  Respiratory: Negative for shortness of breath.   Cardiovascular: Negative for chest pain.  Gastrointestinal: Positive for abdominal distention and abdominal pain.  Genitourinary: Negative for dysuria.  Hematological: Does not bruise/bleed easily.  All other systems reviewed and are negative.   Physical Exam Updated Vital Signs BP (!) 112/51   Pulse (!) 59   Temp (!) 97.3 F (36.3 C) (Oral)   Resp 10   SpO2 100%   Physical Exam Vitals and nursing note reviewed.  Constitutional:      Appearance: She is well-developed.  HENT:     Head: Normocephalic and atraumatic.  Cardiovascular:     Rate and Rhythm: Normal rate.  Pulmonary:     Effort: Pulmonary effort is normal.  Abdominal:     General: Bowel sounds are normal.  Musculoskeletal:     Cervical back: Normal range of motion and neck supple.  Skin:    General: Skin is warm and dry.  Neurological:     Mental Status: She is alert and oriented to person, place, and time.     ED Results / Procedures / Treatments   Labs (all labs ordered are listed, but only abnormal results are displayed) Labs Reviewed  COMPREHENSIVE METABOLIC PANEL - Abnormal; Notable for the following components:      Result Value   Sodium 134 (*)     Potassium 5.2 (*)    CO2 19 (*)    Glucose, Bld 209 (*)    Creatinine, Ser 1.22 (*)    Calcium 8.8 (*)    Total Protein 5.7 (*)    Albumin 1.8 (*)    GFR, Estimated 43 (*)    All other components within normal limits  CBC WITH DIFFERENTIAL/PLATELET - Abnormal; Notable for the following components:   WBC 16.1 (*)    Hemoglobin 9.7 (*)    HCT 31.1 (*)    MCH 25.1 (*)    RDW 18.2 (*)    Neutro Abs 11.3 (*)    Monocytes Absolute 1.4 (*)    Abs Immature Granulocytes 0.55 (*)    All other components within normal limits  T4, FREE - Abnormal; Notable for the following components:   Free T4 1.28 (*)    All other components within normal limits  LACTIC ACID, PLASMA - Abnormal; Notable for the following components:   Lactic Acid, Venous 4.4 (*)    All other components within normal limits  LACTIC ACID, PLASMA - Abnormal; Notable for the following components:   Lactic Acid, Venous 3.8 (*)    All other components within normal limits  BRAIN NATRIURETIC PEPTIDE - Abnormal; Notable for the following components:   B Natriuretic Peptide 547.5 (*)    All other components within normal limits  APTT - Abnormal; Notable for the following components:   aPTT 59 (*)    All other components within normal limits  CBC - Abnormal; Notable for the following components:   WBC 15.2 (*)    RBC 3.21 (*)    Hemoglobin 8.1 (*)    HCT 24.7 (*)    MCV 76.9 (*)    MCH 25.2 (*)    RDW 17.9 (*)    nRBC 0.3 (*)    All other components within normal limits  PROTIME-INR - Abnormal; Notable for the following components:   Prothrombin Time 22.3 (*)    INR 2.0 (*)    All other components within normal  limits  BASIC METABOLIC PANEL - Abnormal; Notable for the following components:   Glucose, Bld 227 (*)    Creatinine, Ser 1.10 (*)    GFR, Estimated 49 (*)    All other components within normal limits  COMPREHENSIVE METABOLIC PANEL - Abnormal; Notable for the following components:   Glucose, Bld 160 (*)     Creatinine, Ser 1.21 (*)    Calcium 8.6 (*)    Total Protein 4.7 (*)    Albumin 1.4 (*)    Alkaline Phosphatase 129 (*)    GFR, Estimated 44 (*)    All other components within normal limits  GLUCOSE, CAPILLARY - Abnormal; Notable for the following components:   Glucose-Capillary 192 (*)    All other components within normal limits  PROTIME-INR - Abnormal; Notable for the following components:   Prothrombin Time 20.9 (*)    INR 1.9 (*)    All other components within normal limits  GLUCOSE, CAPILLARY - Abnormal; Notable for the following components:   Glucose-Capillary 232 (*)    All other components within normal limits  HEMOGLOBIN AND HEMATOCRIT, BLOOD - Abnormal; Notable for the following components:   Hemoglobin 9.0 (*)    HCT 27.9 (*)    All other components within normal limits  HEMOGLOBIN AND HEMATOCRIT, BLOOD - Abnormal; Notable for the following components:   Hemoglobin 8.7 (*)    HCT 26.6 (*)    All other components within normal limits  GLUCOSE, CAPILLARY - Abnormal; Notable for the following components:   Glucose-Capillary 187 (*)    All other components within normal limits  GLUCOSE, CAPILLARY - Abnormal; Notable for the following components:   Glucose-Capillary 140 (*)    All other components within normal limits  CBC - Abnormal; Notable for the following components:   WBC 12.8 (*)    RBC 3.44 (*)    Hemoglobin 8.6 (*)    HCT 26.7 (*)    MCV 77.6 (*)    MCH 25.0 (*)    RDW 18.0 (*)    All other components within normal limits  CBG MONITORING, ED - Abnormal; Notable for the following components:   Glucose-Capillary 184 (*)    All other components within normal limits  I-STAT VENOUS BLOOD GAS, ED - Abnormal; Notable for the following components:   pCO2, Ven 41.8 (*)    Acid-base deficit 4.0 (*)    Sodium 134 (*)    Potassium 5.2 (*)    HCT 30.0 (*)    Hemoglobin 10.2 (*)    All other components within normal limits  POCT I-STAT 7, (LYTES, BLD GAS, ICA,H+H)  - Abnormal; Notable for the following components:   pH, Arterial 7.561 (*)    pO2, Arterial 56 (*)    Bicarbonate 31.3 (*)    Acid-Base Excess 8.0 (*)    HCT 23.0 (*)    Hemoglobin 7.8 (*)    All other components within normal limits  TROPONIN I (HIGH SENSITIVITY) - Abnormal; Notable for the following components:   Troponin I (High Sensitivity) 43 (*)    All other components within normal limits  TROPONIN I (HIGH SENSITIVITY) - Abnormal; Notable for the following components:   Troponin I (High Sensitivity) 37 (*)    All other components within normal limits  RESP PANEL BY RT-PCR (FLU A&B, COVID) [id number]  MRSA PCR SCREENING  CULTURE, BLOOD (ROUTINE X 2)  CULTURE, BLOOD (ROUTINE X 2)  PROTIME-INR  APTT  MAGNESIUM  PHOSPHORUS  TSH  MAGNESIUM  PHOSPHORUS  HEPARIN LEVEL (UNFRACTIONATED)  OCCULT BLOOD X 1 CARD TO LAB, STOOL  T3, FREE  HEPARIN LEVEL (UNFRACTIONATED)  CBC  CBG MONITORING, ED  TYPE AND SCREEN  ABO/RH    EKG None  Radiology CT Head Wo Contrast  Result Date: 02/04/2020 CLINICAL DATA:  Seizure EXAM: CT HEAD WITHOUT CONTRAST TECHNIQUE: Contiguous axial images were obtained from the base of the skull through the vertex without intravenous contrast. COMPARISON:  MRI 01/03/2020 FINDINGS: Brain: No evidence of acute infarction, hemorrhage, hydrocephalus, extra-axial collection or mass lesion/mass effect. Mild low-density changes within the periventricular and subcortical white matter compatible with chronic microvascular ischemic change. Mild diffuse cerebral volume loss. Vascular: Atherosclerotic calcifications involving the large vessels of the skull base. No unexpected hyperdense vessel. Skull: Normal. Negative for fracture or focal lesion. Sinuses/Orbits: No acute finding. Other: None. IMPRESSION: 1. No acute intracranial findings. 2. Chronic microvascular ischemic change and cerebral volume loss. Electronically Signed   By: Duanne Guess D.O.   On: 02/04/2020 15:13    CT ANGIO CHEST PE W OR WO CONTRAST  Result Date: 02/04/2020 CLINICAL DATA:  86 year old who sustained a possible syncopal episode earlier today, associated with diaphoresis. Patient hypotensive in the emergency department. EXAM: CT ANGIOGRAPHY CHEST WITH CONTRAST TECHNIQUE: Multidetector CT imaging of the chest was performed using the standard protocol during bolus administration of intravenous contrast. Multiplanar CT image reconstructions and MIPs were obtained to evaluate the vascular anatomy. CONTRAST:  OMNIPAQUE IOHEXOL 350 MG/ML SOLN COMPARISON:  None. FINDINGS: Cardiovascular: Contrast opacification of pulmonary arteries is moderate. Respiratory motion blurred many of the images. Overall, the study is of moderate diagnostic quality. Extensive BILATERAL pulmonary emboli are present in the distal main pulmonary arteries bilaterally extending into the segmental pulmonary arteries and into subsegmental pulmonary arteries bilaterally. There is evidence of RIGHT heart strain as the RV/LV ratio measures approximately 1.4. Moderate to marked cardiomegaly with RIGHT atrial and RIGHT ventricular enlargement in particular. Reflux of contrast into the intrahepatic IVC and the hepatic veins. Severe three-vessel coronary atherosclerosis. No pericardial effusion. Moderate atherosclerosis involving the thoracic and upper abdominal aorta without evidence of aneurysm. Mediastinum/Nodes: No pathologic lymphadenopathy. Thyroid gland enlargement with multiple nodules. Large hiatal hernia. Normal appearing gas-filled esophagus. Lungs/Pleura: Small LEFT pleural effusion. Dependent atelectasis posteriorly in the lower lobes. No confluent airspace consolidation. Ground-glass opacities in the LEFT UPPER LOBE anteriorly. No parenchymal nodules or masses. Central airways patent without significant bronchial wall thickening. Mild bronchiectasis in the lower lobes. Upper Abdomen: No acute findings apart from large stool  burden in the visualized splenic flexure of the colon. Musculoskeletal: Osseous demineralization. Diffuse degenerative disc disease and spondylosis throughout the lower cervical and thoracic spine. Review of the MIP images confirms the above findings. IMPRESSION: 1. Extensive BILATERAL pulmonary emboli. CT evidence of RIGHT heart strain (RV/LV Ratio 1.4) indicating at least submassive (intermediate risk) PE. The presence of right heart strain has been associated with an increased risk of morbidity and mortality. 2. Small LEFT pleural effusion. 3. Dependent atelectasis posteriorly involving the lower lobes. 4. Ground-glass opacities in the LEFT UPPER LOBE anteriorly, possibly representing early infarct but also seen in acute infection or inflammation. As this is an isolated finding in the LEFT UPPER LOBE, it would be an atypical presentation for COVID-19. 5. Large hiatal hernia. 6. Multinodular goiter. 7. Large stool burden in the visualized splenic flexure of the colon. Aortic Atherosclerosis (ICD10-I70.0). I telephoned these results at the time of interpretation on 02/04/2020 at 3:35 pm to provider Dr.  Smith of Critical Care, who verbally acknowledged these results. Electronically Signed   By: Hulan Saas M.D.   On: 02/04/2020 15:36   DG Chest Port 1 View  Result Date: 02/04/2020 CLINICAL DATA:  86 year old female with possible syncopal episode. EXAM: PORTABLE CHEST 1 VIEW COMPARISON:  01/05/2020 FINDINGS: Defibrillator pads overlie the left hemithorax. Suggestion of similar appearing moderate cardiomegaly. The cardiomediastinal silhouette is otherwise unchanged. Atherosclerotic calcification of the aortic arch. No definite focal consolidations. No significant pleural effusion or pneumothorax. No acute osseous abnormality. IMPRESSION: 1. No acute cardiopulmonary process. 2. Similar appearing cardiomegaly. 3.  Aortic Atherosclerosis (ICD10-I70.0). Electronically Signed   By: Marliss Coots MD   On:  02/04/2020 14:18    Procedures .Critical Care Performed by: Derwood Kaplan, MD Authorized by: Derwood Kaplan, MD   Critical care provider statement:    Critical care time (minutes):  62   Critical care was necessary to treat or prevent imminent or life-threatening deterioration of the following conditions:  Circulatory failure   Critical care was time spent personally by me on the following activities:  Discussions with consultants, evaluation of patient's response to treatment, examination of patient, ordering and performing treatments and interventions, ordering and review of laboratory studies, ordering and review of radiographic studies, pulse oximetry, re-evaluation of patient's condition, obtaining history from patient or surrogate and review of old charts Ultrasound ED Echo  Date/Time: 02/04/2020 3:32 PM Performed by: Derwood Kaplan, MD Authorized by: Derwood Kaplan, MD   Procedure details:    Indications: hypotension     Views: subxiphoid, parasternal long axis view, parasternal short axis view and IVC view     Images: archived     Limitations:  Body habitus Findings:    Pericardium: no pericardial effusion     LV Function: depressed (30 - 50%)     RV Diameter: dilated     IVC: normal   Impression:    Impression: decreased contractility     (including critical care time)  Medications Ordered in ED Medications  norepinephrine (LEVOPHED) 4mg  in premix infusion (5 mcg/min Intravenous Rate/Dose Verify 02/05/20 0600)  0.9 %  sodium chloride infusion (250 mLs Intravenous Not Given 02/04/20 1700)  docusate sodium (COLACE) capsule 100 mg (has no administration in time range)  polyethylene glycol (MIRALAX / GLYCOLAX) packet 17 g (has no administration in time range)  vasopressin (PITRESSIN) 20 Units in sodium chloride 0.9 % 100 mL infusion-*FOR SHOCK* (0 Units/min Intravenous Stopped 02/04/20 2023)  pantoprazole (PROTONIX) injection 40 mg (40 mg Intravenous Given  02/04/20 1833)  insulin aspart (novoLOG) injection 0-15 Units (2 Units Subcutaneous Given 02/05/20 0349)  EPINEPHrine (ADRENALIN) 4 mg in NS 250 mL (0.016 mg/mL) premix infusion (0.5 mcg/min Intravenous Not Given 02/04/20 2015)  heparin ADULT infusion 100 units/mL (25000 units/281mL sodium chloride 0.45%) (850 Units/hr Intravenous Rate/Dose Verify 02/05/20 0600)  chlorhexidine (PERIDEX) 0.12 % solution 15 mL (15 mLs Mouth Rinse Given 02/04/20 2203)  MEDLINE mouth rinse (has no administration in time range)  sodium chloride flush (NS) 0.9 % injection 10-40 mL (10 mLs Intracatheter Given 02/05/20 0130)  sodium chloride flush (NS) 0.9 % injection 10-40 mL (has no administration in time range)  Chlorhexidine Gluconate Cloth 2 % PADS 6 each (has no administration in time range)  sodium chloride 0.9 % bolus 1,000 mL (1,000 mLs Intravenous New Bag/Given 02/04/20 1346)  levETIRAcetam (KEPPRA) IVPB 1000 mg/100 mL premix (0 mg Intravenous Stopped 02/04/20 1519)  iohexol (OMNIPAQUE) 350 MG/ML injection 100 mL (100 mLs Intravenous Contrast  Given 02/04/20 1500)  alteplase (ACTIVASE) 1 mg/mL infusion 100 mg (100 mg Intravenous New Bag/Given 02/04/20 1500)  ondansetron (ZOFRAN) 4 MG/2ML injection (4 mg  Given 02/04/20 1634)  sodium bicarbonate 1 mEq/mL injection (50 mEq  Given 02/04/20 1635)    ED Course  I have reviewed the triage vital signs and the nursing notes.  Pertinent labs & imaging results that were available during my care of the patient were reviewed by me and considered in my medical decision making (see chart for details).    MDM Rules/Calculators/A&P                          86 year old comes in a chief complaint of seizures. She has history of CAD, IDDM, recently diagnosed with thyrotoxicosis and is on medications for it.  Patient arrives to the ER in shock.  1 L of IV fluid ordered.  Pressors initiated.  History is not suggestive of underlying infection.  Patient did have recent E.  coli UTI.  Given that there is a questionable infection involved, we will start her on ceftriaxone right now.  Bedside ultrasound was performed.  Patient did have dilated RV -PE is in the differential but patient denies any chest pain and she does not have any pleurisy or shortness of breath.  Patient is not hypoxic.  IVC did not reveal significant collapse, doubt that she has low volume.  I called CCM as patient was unstable, required further differentiation for shock and we were unable to get art line in the ER.  Dr. Katrinka BlazingSmith responded to the call immediately and was at the bedside with his team.  They will take the patient to the ICU, stop by CT scan on their way up.  In the ER patient has received 1 L of fluid, Keppra, ceftriaxone and she is on 10 mcgs per minute of nor epi.   Code status: Full Final Clinical Impression(s) / ED Diagnoses Final diagnoses:  Shock circulatory (HCC)  Acute pulmonary embolism with acute cor pulmonale, unspecified pulmonary embolism type [facility](HCC)    Rx / DC Orders ED Discharge Orders    None       Derwood KaplanNanavati, Fallon Howerter, MD 02/04/20 1535    Derwood KaplanNanavati, Jahmeek Shirk, MD 02/05/20 [phone]

## 2020-02-04 NOTE — Progress Notes (Signed)
eLink Physician-Brief Progress Note Patient Name: Rebecca [name] DOB: 09-Jan-1933 MRN: [id number]   Date of Service  02/04/2020  HPI/Events of Note  Patient s/p TPA for PE, small quantity of blood-like liquid in her stool, vital signs are stable.  eICU Interventions  Type & screen, stool occult blood, serial H  & H, continue with Heparin infusion for now.        Thomasene Lot Rebecca [name] 02/04/2020, 8:21 PM

## 2020-02-04 NOTE — Progress Notes (Signed)
ANTICOAGULATION CONSULT NOTE - Initial Consult  Pharmacy Consult for IV Heparin Indication: pulmonary embolus  Allergies  Allergen Reactions  . Codeine     "Makes me drunk"    Patient Measurements:   Heparin Dosing Weight: 71.4 kg  Vital Signs: Temp: 98 F (36.7 C) (11/25 1318) Temp Source: Oral (11/25 1318) BP: 90/50 (11/25 1412) Pulse Rate: 63 (11/25 1412)  Labs: Recent Labs    02/04/20 1325 02/04/20 1357  HGB 9.7* 10.2*  HCT 31.1* 30.0*  PLT 368  --   APTT 25  --   LABPROT 14.7  --   INR 1.2  --   CREATININE 1.22*  --   TROPONINIHS 43*  --     Estimated Creatinine Clearance: 34.9 mL/min (A) (by C-G formula based on SCr of 1.22 mg/dL (H)).   Medical History: Past Medical History:  Diagnosis Date  . Anemia, unspecified   . Ankle pain, left   . CAD (coronary artery disease)   . Esophageal reflux   . Fibrocystic breast disease   . Hiatal hernia   . Hypercholesteremia   . Hypertension   . IDDM (insulin dependent diabetes mellitus)    Type II  . Multinodular goiter   . Sickle-cell trait [facility])     Assessment: 86 year old female who presented from [place] [facility] by EMS due to syncopal episode found to have large pulmonary embolism with blown out RV on ECHO. Patient is receiving IV alteplase initiated in the ED at 1500 PM to end at 1700 PM.   Baseline INR 1.2. Hgb 10.2, Platelets within normal limits.  No anticoagulation prior to admission.    Goal of Therapy:  Heparin level 0.3 to 0.5 units/ml Monitor platelets by anticoagulation protocol: Yes   Plan:  APTT, CBC, and INR within 30 minutes of end of tPA infusion.  Will plan to start IV Heparin without a bolus when aPTT is <80.   Link Snuffer, PharmD, BCPS, BCCCP Clinical Pharmacist Please refer to [facility] for [facility] Pharmacy numbers 02/04/2020,3:16 PM

## 2020-02-04 NOTE — Progress Notes (Signed)
Large PE Blown out RV on echo Head CT okay Discussed systemic TPA with patient and will discuss with son, started running given worsening clinical status and syncope  Myrla Halsted MD PCCM

## 2020-02-04 NOTE — Procedures (Signed)
Central Venous Catheter Insertion Procedure Note  KEA CALLAN  [id number]  1932-07-22  Date:02/04/20  Time:4:15 PM   Provider Performing:Demetric Dunnaway   Procedure: Insertion of Non-tunneled Central Venous [phone]) with US guidance (30076)   Indication(s) Medication administration  Consent Risks of the procedure as well as the alternatives and risks of each were explained to the patient and/or caregiver.  Consent for the procedure was obtained and is signed in the bedside chart  Anesthesia Topical only with 1% lidocaine   Timeout Verified patient identification, verified procedure, site/side was marked, verified correct patient position, special equipment/implants available, medications/allergies/relevant history reviewed, required imaging and test results available.  Sterile Technique Maximal sterile technique including full sterile barrier drape, hand hygiene, sterile gown, sterile gloves, mask, hair covering, sterile ultrasound probe cover (if used).  Procedure Description Area of catheter insertion was cleaned with chlorhexidine and draped in sterile fashion.  With real-time ultrasound guidance a central venous catheter was placed into the right internal jugular vein. Nonpulsatile blood flow and easy flushing noted in all ports.  The catheter was sutured in place and sterile dressing applied.  Complications/Tolerance None; patient tolerated the procedure well. Chest X-ray is ordered to verify placement for internal jugular or subclavian cannulation.   Chest x-ray is not ordered for femoral cannulation.  EBL Minimal  Specimen(s) None

## 2020-02-04 NOTE — H&P (Signed)
NAME:  Rebecca [name], MRN:  [id number], DOB:  Nov 20, 1932, LOS: 0 ADMISSION DATE:  02/04/2020, CONSULTATION DATE:  11/25 REFERRING MD:  Dr. Rhunette Croft, CHIEF COMPLAINT:  AMS    Brief History   86 y/o F admitted 11/25 with syncopal episode, hypotension and bradycardia.  CTA positive for large pulmonary embolism, s/p tPA for hemodynamically significant PE.  History of present illness   86 y/o F who presented to [facility] ER 11/25 with questionable syncopal episode.  She was brought in by EMS from [facility] with concern for syncopal episode.  On arrival, she was noted to be hypotensive, sweaty and bradycardic. Bedside ECHO showed right heart findings concerning for RV dysfunction.   She was on room air.  Initial labs - Na 134, K 5.2, CO2 19, glucose 209, Sr Cr 1.22, AG 15, albumin 1.8, Phos 4, Mg 1.9, troponin 43, lactic acid 4.4, WBC 16.1, Hgb 9.7, platelets 368. She was emergently taken for CT Head and CTA Chest.  CT head was negative for acute process. However, CTA chest demonstrated a large pulmonary embolism.  She was treated with full dose tPA for hemodynamically significant PE.   PCCM called for ICU admission.   Past Medical History  IDDM  HTN HLD CAD  GERD  Anemia  Thyroid Disorder   Significant Hospital Events   11/25 Presented to [facility] ER with AMS, questionable syncopal, hypotensive, bradycardic   Consults:  N/A  Procedures:    Significant Diagnostic Tests:  CTA Chest 11/25 >> positive for saddle PE  CT Head 11/25 >> negative  ECHO 11/25 >>    Micro Data:  COVID 11/25 >>  BCx2 11/25 >>   Antimicrobials:    Interim history/subjective:  Consulted  Objective   Blood pressure (!) 90/50, pulse 63, temperature 98 F (36.7 C), temperature source Oral, resp. rate (!) 26, SpO2 96 %.       No intake or output data in the 24 hours ending 02/04/20 1433 There were no vitals filed for this visit.  Examination: General: elderly adult female lying in bed, acutely ill  apperaing    HEENT: MM pink/moist Neuro: moves all 4 ext, globally weak CV: s1s2, no m/r/g, weak pulses PULM:  Clear to auscultation, no wheezing GI: soft, bsx4 active  Extremities: warm/dry, trace edema  Skin: no rashes or lesions  Resolved Hospital Problem list     Assessment & Plan:   Acute Massive Pulmonary Embolism with Obstructive Shock - tpa 100mg  x 2 hours - heparin when ptt back in range - bedrest x 24h  Recent hyperthyroidism- f/u thyroid labs, hold methimazole pending thyroid labs  Best practice (evaluated daily)   Diet: NPO Pain/Anxiety/Delirium protocol (if indicated): n/a VAP protocol (if indicated): n/a DVT prophylaxis: see above GI prophylaxis: PPI Glucose control: SSI, consider long acting Mobility: BR x 24h last date of multidisciplinary goals of care discussion 02/04/20 son affirms full code Family and staff present ED nurse, son, patient Summary of discussion: discussed age, diagnosis, guarded prognosis, all efforts including shocks/compressions/mechanical ventilation requested Follow up goals of care discussion due 02/11/20 Code Status: full Disposition: ICU  Labs   CBC: Recent Labs  Lab 02/04/20 1325 02/04/20 1357  WBC 16.1*  --   NEUTROABS 11.3*  --   HGB 9.7* 10.2*  HCT 31.1* 30.0*  MCV 80.4  --   PLT 368  --     Basic Metabolic Panel: Recent Labs  Lab 02/04/20 1357  NA 134*  K 5.2*   GFR:  Estimated Creatinine Clearance: 53.2 mL/min (by C-G formula based on SCr of 0.78 mg/dL). Recent Labs  Lab 02/04/20 1325  WBC 16.1*    Liver Function Tests: No results for input(s): AST, ALT, ALKPHOS, BILITOT, PROT, ALBUMIN in the last 168 hours. No results for input(s): LIPASE, AMYLASE in the last 168 hours. No results for input(s): AMMONIA in the last 168 hours.  ABG    Component Value Date/Time   HCO3 21.7 02/04/2020 1357   TCO2 23 02/04/2020 1357   ACIDBASEDEF 4.0 (H) 02/04/2020 1357   O2SAT 76.0 02/04/2020 1357      Coagulation Profile: Recent Labs  Lab 02/04/20 1325  INR 1.2    Cardiac Enzymes: No results for input(s): CKTOTAL, CKMB, CKMBINDEX, TROPONINI in the last 168 hours.  HbA1C: Hgb A1c MFr Bld  Date/Time Value Ref Range Status  01/03/2020 05:41 AM 6.5 (H) 4.8 - 5.6 % Final    Comment:    (NOTE)         Prediabetes: 5.7 - 6.4         Diabetes: >6.4         Glycemic control for adults with diabetes: <7.0   11/30/2019 01:36 PM 6.0 (H) <5.7 % of total Hgb Final    Comment:    For someone without known diabetes, a hemoglobin  A1c value between 5.7% and 6.4% is consistent with prediabetes and should be confirmed with a  follow-up test. . For someone with known diabetes, a value <7% indicates that their diabetes is well controlled. A1c targets should be individualized based on duration of diabetes, age, comorbid conditions, and other considerations. . This assay result is consistent with an increased risk of diabetes. . Currently, no consensus exists regarding use of hemoglobin A1c for diagnosis of diabetes for children. .     CBG: Recent Labs  Lab 02/04/20 1319  GLUCAP 184*    Review of Systems: Positives in [place]  Gen: fever, chills, weight change, fatigue, night sweats HEENT: blurred vision, double vision, hearing loss, tinnitus, sinus congestion, rhinorrhea, sore throat, neck stiffness, dysphagia PULM: Denies shortness of breath, cough, sputum production, hemoptysis, wheezing CV: chest pain, edema, syncope, orthopnea, paroxysmal nocturnal dyspnea, palpitations GI: abdominal pain, nausea, vomiting, diarrhea, hematochezia, melena, constipation, change in bowel habits GU: dysuria, hematuria, polyuria, oliguria, urethral discharge Endocrine: Denies hot or cold intolerance, polyuria, polyphagia or appetite change Derm: rash, dry skin, scaling or peeling skin change Heme: easy bruising, bleeding, bleeding gums Neuro: headache, numbness, weakness, slurred speech, loss  of memory or consciousness   Past Medical History  She,  has a past medical history of Anemia, unspecified, Ankle pain, left, CAD (coronary artery disease), Esophageal reflux, Fibrocystic breast disease, Hiatal hernia, Hypercholesteremia, Hypertension, IDDM (insulin dependent diabetes mellitus), Multinodular goiter, and Sickle-cell trait (HCC).   Surgical History    Past Surgical History:  Procedure Laterality Date  . "FEMALE"  1976  . BACK SURGERY  1990  . BIOPSY THYROID     Dr.Ellison-Neg   . CORONARY ANGIOPLASTY    . FOOT SURGERY  2004  . RETINAL LASER PROCEDURE       Social History   reports that she has never smoked. She has never used smokeless tobacco. She reports that she does not drink alcohol and does not use drugs.   Family History   Her family history includes Diabetes in her mother; Gout in her brother, brother, and sister; Heart attack in her father and mother; Heart disease in an other family member; Hypertension  in an other family member; Lung cancer in an other family member; Thyroid disease in her brother.   Allergies Allergies  Allergen Reactions  . Codeine     "Makes me drunk"     Home Medications  Prior to Admission medications   Medication Sig Start Date End Date Taking? Authorizing Provider  Ascorbic Acid (VITAMIN C) 500 MG tablet Take 500 mg by mouth 2 (two) times daily.      [provider]  aspirin 81 MG tablet Take 81 mg by mouth daily.      [provider]  atorvastatin (LIPITOR) 10 MG tablet TAKE 1 TABLET BY MOUTH  DAILY AT 6 PM 11/30/19   Burns, [name], MD  brimonidine (ALPHAGAN) 0.2 % ophthalmic solution Place 1 drop into the right eye 2 (two) times daily. 03/02/19   [provider]  calcium carbonate (OS-CAL) 600 MG TABS Take 600 mg by mouth daily.     [provider]  Cod Liver Oil 1000 MG CAPS Take 1,000 mg by mouth daily. During the winter    [provider]  DORZOLAMIDE HCL-TIMOLOL MAL OP Place 1  drop into the right eye 2 (two) times daily.    [provider]  gabapentin (NEURONTIN) 300 MG capsule Take 300 mg by mouth daily.    [provider]  Glucerna (GLUCERNA) LIQD Take 237 mLs by mouth daily. 01/22/20   [provider]  insulin isophane & regular human (HUMULIN 70/30 KWIKPEN) (70-30) 100 UNIT/ML KwikPen Inject 15 Units into the skin in the morning. 01/11/20   [provider]  insulin isophane & regular human (HUMULIN 70/30 KWIKPEN) (70-30) 100 UNIT/ML KwikPen Inject 7-10 Units into the skin every evening. CBG 70-120=0 UNITS, 121-150-1UNIT, 151-200=2 UNITS, 201-250=3 UNITS, 251-300=5 UNITS, 301-350=7 UNITS, 351-400=9 UNITS, GREATER THAN 400 CALL PROVIDER AND GIVE 9 UNITS 01/11/20   [provider]  isosorbide mononitrate (IMDUR) 30 MG 24 hr tablet TAKE 1 TABLET BY MOUTH  DAILY 01/06/20   Burns, [name], MD  latanoprost (XALATAN) 0.005 % ophthalmic solution Place 1 drop into the right eye at bedtime.  01/11/20   [provider]  lisinopril (ZESTRIL) 20 MG tablet Take 1 tablet (20 mg total) by mouth daily. 01/12/20   Lewie Chamber, MD  methimazole (TAPAZOLE) 10 MG tablet Take 10 mg by mouth 2 (two) times daily. 01/20/20   [provider]  Multiple Vitamin (MULTIVITAMIN) tablet Take 1 tablet by mouth daily. Prn    [provider]  nitroGLYCERIN (NITROSTAT) 0.4 MG SL tablet DISSOLVE ONE TABLET UNDER TONGUE AS DIRECTED FOR CHEST PAIN 08/05/13   Laurey Morale, MD  omeprazole (PRILOSEC) 20 MG capsule TAKE 1 CAPSULE BY MOUTH IN  THE MORNING 11/13/19   Burns, [name], MD  propranolol (INDERAL) 20 MG tablet Take 1 tablet (20 mg total) by mouth 2 (two) times daily. 01/11/20   Lewie Chamber, MD  Trolamine Salicylate (ASPERCREME) 10 % LOTN Use twice daily as needed for right knee pain.    [provider]     Patient critically ill due to massive pulmonary embolus Interventions to address this today pressors, tpa Risk of  deterioration without these interventions is high  I personally spent 34 minutes providing critical care not including any separately billable procedures  Myrla Halsted MD [facility] 02/04/2020 3:56 PM Personal pager: [phone] If unanswered, please page CCM On-call: #[phone]

## 2020-02-04 NOTE — Procedures (Signed)
Arterial Catheter Insertion Procedure Note  Rebecca [name]  [id number]  December 31, 1932  Date:02/04/20  Time:3:38 PM    Provider Performing: Cheri Fowler    Procedure: Insertion of Arterial Line (27062) with US guidance (37628)   Indication(s) Blood pressure monitoring and/or need for frequent ABGs  Consent Risks of the procedure as well as the alternatives and risks of each were explained to the patient and/or caregiver.  Consent for the procedure was obtained and is signed in the bedside chart  Anesthesia None   Time Out Verified patient identification, verified procedure, site/side was marked, verified correct patient position, special equipment/implants available, medications/allergies/relevant history reviewed, required imaging and test results available.   Sterile Technique Maximal sterile technique including full sterile barrier drape, hand hygiene, sterile gown, sterile gloves, mask, hair covering, sterile ultrasound probe cover (if used).   Procedure Description Area of catheter insertion was cleaned with chlorhexidine and draped in sterile fashion. With real-time ultrasound guidance an arterial catheter was placed into the left Axillary artery.  Appropriate arterial tracings confirmed on monitor.     Complications/Tolerance None; patient tolerated the procedure well.   EBL Minimal   Specimen(s) None

## 2020-02-04 NOTE — Progress Notes (Signed)
Patient arrived to the MICU from CT scan with ED nurse very unstable. Patient has TPA and Levo running in peripheral lines. Team  Rushed to patient aid and patient received several medications to manage BP and symptoms,(see doctor Katrinka Blazing note) A-Line and central placed. Patient bleeding at right IJ site where TLC was placed. Pressure dressing applied.

## 2020-02-04 NOTE — Progress Notes (Signed)
Patient markedly unstable on arrival to MICU. Given multiple amps of bicarb and about 1.5mg  of epinephrine, 1g calcium chloride, 10mg  of 100mg  tpa pushed. Gradually now starting to respond from BP standpoint from 173mcg/min of epi to 29mcg/min presently. ABG shows alkalosis so likely bicarb benefit was from salt load, would be generous with IVF as RV becomes offloaded Never lost pulses Mental status intact throughout  Appreciate Dr. 80m and Nanavati's help.  30m MD PCCM

## 2020-02-04 NOTE — ED Triage Notes (Signed)
Pt BIB EMS from [facility]. Ems was called due to possible syncopal episode. Pt has h/o CHF. Pt arrived sweaty.Pt has h/o thyroid storm.Pt arrived hypotensive.

## 2020-02-04 NOTE — Progress Notes (Addendum)
ANTICOAGULATION CONSULT NOTE  Pharmacy Consult for IV Heparin Indication: pulmonary embolus  Allergies  Allergen Reactions  . Codeine     "Makes me drunk"    Patient Measurements:   Heparin Dosing Weight: 71.4 kg  Vital Signs: Temp: 97.3 F (36.3 C) (11/25 1600) Temp Source: Oral (11/25 1600) BP: 155/61 (11/25 1637) Pulse Rate: 77 (11/25 1637)  Labs: Recent Labs    02/04/20 1325 02/04/20 1325 02/04/20 1357 02/04/20 1357 02/04/20 1543 02/04/20 1620 02/04/20 1624  HGB 9.7*   < > 10.2*   < >  --  8.1* 7.8*  HCT 31.1*   < > 30.0*  --   --  24.7* 23.0*  PLT 368  --   --   --   --  254  --   APTT 25  --   --   --   --  59*  --   LABPROT 14.7  --   --   --   --  22.3*  --   INR 1.2  --   --   --   --  2.0*  --   CREATININE 1.22*  --   --   --   --  1.10*  --   TROPONINIHS 43*  --   --   --  37*  --   --    < > = values in this interval not displayed.    Estimated Creatinine Clearance: 38.7 mL/min (A) (by C-G formula based on SCr of 1.1 mg/dL (H)).  Assessment: 86 year old female who presented from [place] [facility] by EMS due to syncopal episode, found to have large pulmonary embolism with blown out RV on ECHO. Patient is receiving IV alteplase initiated in the ED at 1500 PM to end at 1700 PM.   APTT drawn while alteplase was still infusing, and it is still < 80 sec.  RN reports that a central line was placed and patient is bleeding from the site.  Spoke to CCM, delay heparin restart until 2200.  Noted INR of 2.   Goal of Therapy:  Heparin level 0.3 to 0.5 units/ml through 02/05/20 at 1700 Monitor platelets by anticoagulation protocol: Yes   Plan:  At 2200, start IV heparin at 850 units/hr, no bolus post tPA Check 8 hr heparin level Daily heparin level and CBC  Rinoa Garramone D. Laney Potash, PharmD, BCPS, BCCCP 02/04/2020, 5:49 PM

## 2020-02-05 ENCOUNTER — Inpatient Hospital Stay (HOSPITAL_COMMUNITY): Payer: Medicare Other

## 2020-02-05 DIAGNOSIS — I361 Nonrheumatic tricuspid (valve) insufficiency: Secondary | ICD-10-CM

## 2020-02-05 DIAGNOSIS — I2699 Other pulmonary embolism without acute cor pulmonale: Secondary | ICD-10-CM | POA: Diagnosis not present

## 2020-02-05 LAB — CBC
HCT: 26.7 % — ABNORMAL LOW (ref 36.0–46.0)
HCT: 25.8 % — ABNORMAL LOW (ref 36.0–46.0)
Hemoglobin: 8.6 g/dL — ABNORMAL LOW (ref 12.0–15.0)
Hemoglobin: 8.2 g/dL — ABNORMAL LOW (ref 12.0–15.0)
MCH: 25 pg — ABNORMAL LOW (ref 26.0–34.0)
MCH: 24.7 pg — ABNORMAL LOW (ref 26.0–34.0)
MCHC: 32.2 g/dL (ref 30.0–36.0)
MCHC: 31.8 g/dL (ref 30.0–36.0)
MCV: 77.6 fL — ABNORMAL LOW (ref 80.0–100.0)
MCV: 77.7 fL — ABNORMAL LOW (ref 80.0–100.0)
Platelets: 342 10*3/uL (ref 150–400)
Platelets: 324 10*3/uL (ref 150–400)
RBC: 3.44 MIL/uL — ABNORMAL LOW (ref 3.87–5.11)
RBC: 3.32 MIL/uL — ABNORMAL LOW (ref 3.87–5.11)
RDW: 18 % — ABNORMAL HIGH (ref 11.5–15.5)
RDW: 18 % — ABNORMAL HIGH (ref 11.5–15.5)
WBC: 12.8 10*3/uL — ABNORMAL HIGH (ref 4.0–10.5)
WBC: 11.8 10*3/uL — ABNORMAL HIGH (ref 4.0–10.5)
nRBC: 0.2 % (ref 0.0–0.2)
nRBC: 0.2 % (ref 0.0–0.2)

## 2020-02-05 LAB — BASIC METABOLIC PANEL
Anion gap: 11 (ref 5–15)
BUN: 20 mg/dL (ref 8–23)
CO2: 25 mmol/L (ref 22–32)
Calcium: 8.1 mg/dL — ABNORMAL LOW (ref 8.9–10.3)
Chloride: 101 mmol/L (ref 98–111)
Creatinine, Ser: 1.22 mg/dL — ABNORMAL HIGH (ref 0.44–1.00)
GFR, Estimated: 43 mL/min — ABNORMAL LOW (ref >60)
Glucose, Bld: 179 mg/dL — ABNORMAL HIGH (ref 70–99)
Potassium: 3.9 mmol/L (ref 3.5–5.1)
Sodium: 137 mmol/L (ref 135–145)

## 2020-02-05 LAB — GLUCOSE, CAPILLARY
Glucose-Capillary: 140 mg/dL — ABNORMAL HIGH (ref 70–99)
Glucose-Capillary: 137 mg/dL — ABNORMAL HIGH (ref 70–99)
Glucose-Capillary: 104 mg/dL — ABNORMAL HIGH (ref 70–99)
Glucose-Capillary: 149 mg/dL — ABNORMAL HIGH (ref 70–99)
Glucose-Capillary: 94 mg/dL (ref 70–99)

## 2020-02-05 LAB — PHOSPHORUS: Phosphorus: 4.4 mg/dL (ref 2.5–4.6)

## 2020-02-05 LAB — COMPREHENSIVE METABOLIC PANEL
ALT: 16 U/L (ref 0–44)
AST: 28 U/L (ref 15–41)
Albumin: 1.4 g/dL — ABNORMAL LOW (ref 3.5–5.0)
Alkaline Phosphatase: 129 U/L — ABNORMAL HIGH (ref 38–126)
Anion gap: 14 (ref 5–15)
BUN: 21 mg/dL (ref 8–23)
CO2: 25 mmol/L (ref 22–32)
Calcium: 8.6 mg/dL — ABNORMAL LOW (ref 8.9–10.3)
Chloride: 101 mmol/L (ref 98–111)
Creatinine, Ser: 1.21 mg/dL — ABNORMAL HIGH (ref 0.44–1.00)
GFR, Estimated: 44 mL/min — ABNORMAL LOW (ref >60)
Glucose, Bld: 160 mg/dL — ABNORMAL HIGH (ref 70–99)
Potassium: 4 mmol/L (ref 3.5–5.1)
Sodium: 140 mmol/L (ref 135–145)
Total Bilirubin: 0.7 mg/dL (ref 0.3–1.2)
Total Protein: 4.7 g/dL — ABNORMAL LOW (ref 6.5–8.1)

## 2020-02-05 LAB — PROTIME-INR
INR: 1.9 — ABNORMAL HIGH (ref 0.8–1.2)
Prothrombin Time: 20.9 seconds — ABNORMAL HIGH (ref 11.4–15.2)

## 2020-02-05 LAB — HEPARIN LEVEL (UNFRACTIONATED)
Heparin Unfractionated: 0.37 IU/mL (ref 0.30–0.70)
Heparin Unfractionated: 0.44 IU/mL (ref 0.30–0.70)

## 2020-02-05 LAB — ECHOCARDIOGRAM COMPLETE
Area-P 1/2: 2.5 cm2
S' Lateral: 2.2 cm

## 2020-02-05 LAB — MAGNESIUM: Magnesium: 1.8 mg/dL (ref 1.7–2.4)

## 2020-02-05 LAB — HEMOGLOBIN AND HEMATOCRIT, BLOOD
HCT: 26.6 % — ABNORMAL LOW (ref 36.0–46.0)
Hemoglobin: 8.7 g/dL — ABNORMAL LOW (ref 12.0–15.0)

## 2020-02-05 MED ORDER — LACTATED RINGERS IV BOLUS
1000.0000 mL | Freq: Once | INTRAVENOUS | Status: AC
  Administered 2020-02-05: 1000 mL via INTRAVENOUS

## 2020-02-05 MED ORDER — CHLORHEXIDINE GLUCONATE CLOTH 2 % EX PADS
6.0000 | MEDICATED_PAD | Freq: Every day | CUTANEOUS | Status: DC
  Administered 2020-02-05 – 2020-02-09 (×4): 6 via TOPICAL

## 2020-02-05 MED ORDER — METHIMAZOLE 5 MG PO TABS
10.0000 mg | ORAL_TABLET | Freq: Two times a day (BID) | ORAL | Status: DC
  Administered 2020-02-05 – 2020-02-11 (×13): 10 mg via ORAL
  Filled 2020-02-05 (×3): qty 2
  Filled 2020-02-05: qty 1
  Filled 2020-02-05: qty 2
  Filled 2020-02-05: qty 1
  Filled 2020-02-05 (×3): qty 2
  Filled 2020-02-05: qty 1
  Filled 2020-02-05 (×3): qty 2
  Filled 2020-02-05: qty 1

## 2020-02-05 MED ORDER — LATANOPROST 0.005 % OP SOLN
1.0000 [drp] | Freq: Every day | OPHTHALMIC | Status: DC
  Administered 2020-02-05 – 2020-02-10 (×6): 1 [drp] via OPHTHALMIC
  Filled 2020-02-05: qty 2.5

## 2020-02-05 MED ORDER — INSULIN ASPART 100 UNIT/ML ~~LOC~~ SOLN: 0.0000 [IU] | Freq: Every day | SUBCUTANEOUS | Status: DC

## 2020-02-05 MED ORDER — INSULIN DETEMIR 100 UNIT/ML ~~LOC~~ SOLN
8.0000 [IU] | Freq: Two times a day (BID) | SUBCUTANEOUS | Status: DC
  Administered 2020-02-05 (×2): 8 [IU] via SUBCUTANEOUS
  Filled 2020-02-05 (×4): qty 0.08

## 2020-02-05 MED ORDER — MAGNESIUM SULFATE 2 GM/50ML IV SOLN
2.0000 g | Freq: Once | INTRAVENOUS | Status: AC
  Administered 2020-02-05: 2 g via INTRAVENOUS
  Filled 2020-02-05: qty 50

## 2020-02-05 MED ORDER — SODIUM CHLORIDE 0.9% FLUSH
10.0000 mL | Freq: Two times a day (BID) | INTRAVENOUS | Status: DC
  Administered 2020-02-05 – 2020-02-11 (×12): 10 mL

## 2020-02-05 MED ORDER — INSULIN ASPART 100 UNIT/ML ~~LOC~~ SOLN
0.0000 [IU] | Freq: Three times a day (TID) | SUBCUTANEOUS | Status: DC
  Administered 2020-02-05: 2 [IU] via SUBCUTANEOUS
  Administered 2020-02-06: 3 [IU] via SUBCUTANEOUS
  Administered 2020-02-06: 2 [IU] via SUBCUTANEOUS
  Administered 2020-02-07: 3 [IU] via SUBCUTANEOUS
  Administered 2020-02-07: 2 [IU] via SUBCUTANEOUS
  Administered 2020-02-07: 3 [IU] via SUBCUTANEOUS
  Administered 2020-02-08: 5 [IU] via SUBCUTANEOUS
  Administered 2020-02-08 – 2020-02-09 (×4): 3 [IU] via SUBCUTANEOUS
  Administered 2020-02-09: 2 [IU] via SUBCUTANEOUS
  Administered 2020-02-10 – 2020-02-11 (×6): 3 [IU] via SUBCUTANEOUS

## 2020-02-05 MED ORDER — SODIUM CHLORIDE 0.9% FLUSH: 10.0000 mL | INTRAVENOUS | Status: DC | PRN

## 2020-02-05 NOTE — Progress Notes (Signed)
ANTICOAGULATION CONSULT NOTE - Initial Consult  Pharmacy Consult for IV Heparin Indication: pulmonary embolus  Allergies  Allergen Reactions  . Codeine     "Makes me drunk"    Patient Measurements:   Heparin Dosing Weight: 71.4 kg  Vital Signs: Temp: 97.5 F (36.4 C) (11/26 1200) Temp Source: Oral (11/26 0508) BP: 110/45 (11/26 1200) Pulse Rate: 67 (11/26 1200)  Labs: Recent Labs    02/04/20 1325 02/04/20 1325 02/04/20 1357 02/04/20 1543 02/04/20 1620 02/04/20 1624 02/05/20 0026 02/05/20 0026 02/05/20 0459 02/05/20 1359  HGB 9.7*   < >   < >  --  8.1*   < > 8.7*   < > 8.6* 8.2*  HCT 31.1*   < >   < >  --  24.7*   < > 26.6*  --  26.7* 25.8*  PLT 368   < >  --   --  254  --   --   --  342 324  APTT 25  --   --   --  59*  --   --   --   --   --   LABPROT 14.7  --   --   --  22.3*  --   --   --  20.9*  --   INR 1.2  --   --   --  2.0*  --   --   --  1.9*  --   HEPARINUNFRC  --   --   --   --   --   --   --   --  0.37 0.44  CREATININE 1.22*  --   --   --  1.10*  --   --   --  1.21*  --   TROPONINIHS 43*  --   --  37*  --   --   --   --   --   --    < > = values in this interval not displayed.    Estimated Creatinine Clearance: 35.2 mL/min (A) (by C-G formula based on SCr of 1.21 mg/dL (H)).   Medical History: Past Medical History:  Diagnosis Date  . Anemia, unspecified   . Ankle pain, left   . CAD (coronary artery disease)   . Esophageal reflux   . Fibrocystic breast disease   . Hiatal hernia   . Hypercholesteremia   . Hypertension   . IDDM (insulin dependent diabetes mellitus)    Type II  . Multinodular goiter   . Sickle-cell trait [facility])     Assessment: 86 year old female who presented from [place] [facility] by EMS due to syncopal episode found to have large pulmonary embolism. Patient received IV alteplase initiated in the ED and end at 1700 PM.   INR 1.9. Hgb is stable in 8 range for now. Platelets are within normal limits. No overt bleeding noted. Heparin  level is therapeutic x2 levels.    Goal of Therapy:  Heparin level 0.3 to 0.5 units/ml until 1700PM tonight then normal goal of 0.3 to 0.7 Monitor platelets by anticoagulation protocol: Yes   Plan:  Continue IV Heparin at 850 units/hr.  Daily Heparin level and CBC while on therapy.  Likely will transition to oral therapy tomorrow.   Link Snuffer, PharmD, BCPS, BCCCP Clinical Pharmacist Please refer to [facility] for [facility] Pharmacy numbers 02/05/2020,2:58 PM

## 2020-02-05 NOTE — Progress Notes (Signed)
  Echocardiogram 2D Echocardiogram has been performed.  Rebecca [name] 02/05/2020, 8:45 AM

## 2020-02-05 NOTE — Progress Notes (Signed)
NAME:  Rebecca [name], MRN:  [id number], DOB:  Sep 16, 1932, LOS: 1 ADMISSION DATE:  02/04/2020, CONSULTATION DATE:  11/25 REFERRING MD:  Dr. Rhunette Croft, CHIEF COMPLAINT:  AMS    Brief History   86 y/o F admitted 11/25 with syncopal episode, hypotension and bradycardia.  CTA positive for large pulmonary embolism, s/p tPA for hemodynamically significant PE.  History of present illness   86 y/o F who presented to [facility] ER 11/25 with questionable syncopal episode.  She was brought in by EMS from [facility] with concern for syncopal episode.  On arrival, she was noted to be hypotensive, sweaty and bradycardic. Bedside ECHO showed right heart findings concerning for RV dysfunction.   She was on room air.  Initial labs - Na 134, K 5.2, CO2 19, glucose 209, Sr Cr 1.22, AG 15, albumin 1.8, Phos 4, Mg 1.9, troponin 43, lactic acid 4.4, WBC 16.1, Hgb 9.7, platelets 368. She was emergently taken for CT Head and CTA Chest.  CT head was negative for acute process. However, CTA chest demonstrated a large pulmonary embolism.  She was treated with full dose tPA for hemodynamically significant PE.   PCCM called for ICU admission.   Past Medical History  IDDM  HTN HLD CAD  GERD  Anemia  Thyroid Disorder   Significant Hospital Events   11/25 Presented to [facility] ER with AMS, questionable syncopal, hypotensive, bradycardic   Consults:  N/A  Procedures:    Significant Diagnostic Tests:  CTA Chest 11/25 >> positive for saddle PE  CT Head 11/25 >> negative  ECHO 11/25 >>    Micro Data:  COVID 11/25 >>  BCx2 11/25 >>   Antimicrobials:    Interim history/subjective:  Some loose stools question bloody. Pressor requirements down. Feeling better.  Objective   Blood pressure (!) 112/51, pulse (!) 59, temperature (!) 97.3 F (36.3 C), temperature source Oral, resp. rate 10, SpO2 100 %.        Intake/Output Summary (Last 24 hours) at 02/05/2020 0817 Last data filed at 02/05/2020 0600 Gross  per 24 hour  Intake 435.32 ml  Output --  Net 435.32 ml   There were no vitals filed for this visit.  Examination: Constitutional: elderly woman in NAD  Eyes: EOMI, pupils equal Ears, nose, mouth, and throat: MMM, trachea midline Cardiovascular: RRR, ext warm Respiratory: clear, no wheezing or accessory muscle use Gastrointestinal: soft, +BS Skin: No rashes, normal turgor Neurologic: Moves all 4 ext to command Psychiatric: RASS 0  Renal function looks good Mild drop in [facility]  Resolved Hospital Problem list     Assessment & Plan:   Acute Massive Pulmonary Embolism with Obstructive Shock- tpa 02/05/20 - Continue heparin today - Transition to NoAC tomorrow - Wean levophed  Recent hyperthyroidism - Resume methimazole, thyroid labs look okay  Hx htn- hold while on pressors  DM2 with Hyperglycemia- SSI, levemir  Best practice (evaluated daily)   Diet: NPO Pain/Anxiety/Delirium protocol (if indicated): n/a VAP protocol (if indicated): n/a DVT prophylaxis: see above GI prophylaxis: PPI Glucose control: SSI, consider long acting Mobility: BR x 24h last date of multidisciplinary goals of care discussion 02/04/20 son affirms full code Family and staff present ED nurse, son, patient Summary of discussion: discussed age, diagnosis, guarded prognosis, all efforts including shocks/compressions/mechanical ventilation requested Follow up goals of care discussion due 02/11/20 Code Status: full Disposition: ICU   Patient critically ill due to obstructive shock Interventions to address this today heparin, weaning pressors Risk of deterioration without  these interventions is high  I personally spent 34 minutes providing critical care not including any separately billable procedures  Myrla Halsted MD [facility] 02/05/2020 8:24 AM Personal pager: [phone] If unanswered, please page CCM On-call: #[phone]

## 2020-02-05 NOTE — Progress Notes (Signed)
ANTICOAGULATION CONSULT NOTE  Pharmacy Consult for IV Heparin Indication: pulmonary embolus  Assessment: 86 year old female who presented from [place] by EMS due to syncopal episode, found to have large pulmonary embolism with blown out RV on ECHO. Patient is receiving IV alteplase initiated in the ED at 1500 PM to end at 1700 PM.   APTT drawn while alteplase was still infusing, and it is still < 80 sec.  RN reports that a central line was placed and patient is bleeding from the site.  Spoke to CCM, delay heparin restart until 2200.  Noted INR of 2. Heparin level this am 0.37 units/ml  RN reports stool streaked with blood earlier but no further bleeding noted   Goal of Therapy:  Heparin level 0.3 to 0.5 units/ml through 02/05/20 at 1700 Monitor platelets by anticoagulation protocol: Yes   Plan:  Continue heparin drip at 850 units/ml Check 8 hr heparin level Daily heparin level and CBC  Thanks for allowing pharmacy to be a part of this patient's care.  Talbert Cage, PharmD Clinical Pharmacist 02/05/2020, 5:44 AM

## 2020-02-06 LAB — COMPREHENSIVE METABOLIC PANEL
ALT: 15 U/L (ref 0–44)
AST: 23 U/L (ref 15–41)
Albumin: 1.4 g/dL — ABNORMAL LOW (ref 3.5–5.0)
Alkaline Phosphatase: 110 U/L (ref 38–126)
Anion gap: 11 (ref 5–15)
BUN: 18 mg/dL (ref 8–23)
CO2: 26 mmol/L (ref 22–32)
Calcium: 8 mg/dL — ABNORMAL LOW (ref 8.9–10.3)
Chloride: 99 mmol/L (ref 98–111)
Creatinine, Ser: 1.11 mg/dL — ABNORMAL HIGH (ref 0.44–1.00)
GFR, Estimated: 49 mL/min — ABNORMAL LOW (ref >60)
Glucose, Bld: 82 mg/dL (ref 70–99)
Potassium: 3.9 mmol/L (ref 3.5–5.1)
Sodium: 136 mmol/L (ref 135–145)
Total Bilirubin: 0.4 mg/dL (ref 0.3–1.2)
Total Protein: 4.4 g/dL — ABNORMAL LOW (ref 6.5–8.1)

## 2020-02-06 LAB — CBC
HCT: 24.4 % — ABNORMAL LOW (ref 36.0–46.0)
Hemoglobin: 7.9 g/dL — ABNORMAL LOW (ref 12.0–15.0)
MCH: 25.6 pg — ABNORMAL LOW (ref 26.0–34.0)
MCHC: 32.4 g/dL (ref 30.0–36.0)
MCV: 79 fL — ABNORMAL LOW (ref 80.0–100.0)
Platelets: 315 10*3/uL (ref 150–400)
RBC: 3.09 MIL/uL — ABNORMAL LOW (ref 3.87–5.11)
RDW: 18.4 % — ABNORMAL HIGH (ref 11.5–15.5)
WBC: 10.2 10*3/uL (ref 4.0–10.5)
nRBC: 0.3 % — ABNORMAL HIGH (ref 0.0–0.2)

## 2020-02-06 LAB — GLUCOSE, CAPILLARY
Glucose-Capillary: 78 mg/dL (ref 70–99)
Glucose-Capillary: 80 mg/dL (ref 70–99)
Glucose-Capillary: 124 mg/dL — ABNORMAL HIGH (ref 70–99)
Glucose-Capillary: 170 mg/dL — ABNORMAL HIGH (ref 70–99)
Glucose-Capillary: 116 mg/dL — ABNORMAL HIGH (ref 70–99)

## 2020-02-06 LAB — MAGNESIUM: Magnesium: 2 mg/dL (ref 1.7–2.4)

## 2020-02-06 LAB — T3, FREE: T3, Free: 1.9 pg/mL — ABNORMAL LOW (ref 2.0–4.4)

## 2020-02-06 LAB — PHOSPHORUS: Phosphorus: 3.4 mg/dL (ref 2.5–4.6)

## 2020-02-06 LAB — HEPARIN LEVEL (UNFRACTIONATED): Heparin Unfractionated: 0.39 IU/mL (ref 0.30–0.70)

## 2020-02-06 MED ORDER — GABAPENTIN 300 MG PO CAPS
300.0000 mg | ORAL_CAPSULE | Freq: Every day | ORAL | Status: DC
  Administered 2020-02-06 – 2020-02-11 (×6): 300 mg via ORAL
  Filled 2020-02-06 (×6): qty 1

## 2020-02-06 MED ORDER — ATORVASTATIN CALCIUM 10 MG PO TABS
10.0000 mg | ORAL_TABLET | Freq: Every day | ORAL | Status: DC
  Administered 2020-02-06 – 2020-02-11 (×6): 10 mg via ORAL
  Filled 2020-02-06 (×6): qty 1

## 2020-02-06 MED ORDER — ASPIRIN EC 81 MG PO TBEC
81.0000 mg | DELAYED_RELEASE_TABLET | Freq: Every day | ORAL | Status: DC
  Administered 2020-02-06: 81 mg via ORAL
  Filled 2020-02-06: qty 1

## 2020-02-06 MED ORDER — DORZOLAMIDE HCL-TIMOLOL MAL 2-0.5 % OP SOLN
1.0000 [drp] | Freq: Two times a day (BID) | OPHTHALMIC | Status: DC
  Administered 2020-02-06 – 2020-02-11 (×11): 1 [drp] via OPHTHALMIC
  Filled 2020-02-06: qty 10

## 2020-02-06 MED ORDER — APIXABAN 5 MG PO TABS
5.0000 mg | ORAL_TABLET | Freq: Two times a day (BID) | ORAL | Status: DC
  Administered 2020-02-06 – 2020-02-11 (×11): 5 mg via ORAL
  Filled 2020-02-06 (×11): qty 1

## 2020-02-06 MED ORDER — ALBUMIN HUMAN 25 % IV SOLN
25.0000 g | Freq: Four times a day (QID) | INTRAVENOUS | Status: AC
  Administered 2020-02-06 – 2020-02-07 (×4): 25 g via INTRAVENOUS
  Filled 2020-02-06 (×3): qty 100

## 2020-02-06 MED ORDER — PANTOPRAZOLE SODIUM 40 MG PO TBEC
40.0000 mg | DELAYED_RELEASE_TABLET | Freq: Two times a day (BID) | ORAL | Status: DC
  Administered 2020-02-06 – 2020-02-11 (×11): 40 mg via ORAL
  Filled 2020-02-06 (×11): qty 1

## 2020-02-06 MED ORDER — BRIMONIDINE TARTRATE 0.2 % OP SOLN
1.0000 [drp] | Freq: Two times a day (BID) | OPHTHALMIC | Status: DC
  Administered 2020-02-06 – 2020-02-11 (×11): 1 [drp] via OPHTHALMIC
  Filled 2020-02-06: qty 5

## 2020-02-06 NOTE — Care Plan (Signed)
[facility] Transfer Note  86 yo F with DM, CAD, HTN, CKD IIIa, hyperthyroidism, and dCHF who presented with syncope and massive PE.  Now s/p tPA.  Has been hemodynamically stable.   Improving. Off pressors.  Started Eliquis today.   Hgb trending down, but no overt bleeding noted.

## 2020-02-06 NOTE — Discharge Instructions (Signed)

## 2020-02-06 NOTE — Progress Notes (Signed)
   NAME:  Rebecca [name], MRN:  [id number], DOB:  14-Sep-1932, LOS: 2 ADMISSION DATE:  02/04/2020, CONSULTATION DATE:  11/25 REFERRING MD:  Dr. Rhunette Croft, CHIEF COMPLAINT:  AMS    Brief History   86 y/o F admitted 11/25 with syncopal episode, hypotension and bradycardia.  CTA positive for large pulmonary embolism, s/p tPA for hemodynamically significant PE.  History of present illness   86 y/o F who presented to [facility] ER 11/25 with syncopal episode.  She was brought in by EMS from [facility] with concern for syncopal episode.  On arrival, she was noted to be hypotensive, sweaty and bradycardic. Bedside ECHO showed right heart findings concerning for RV dysfunction.   She was on room air.  Initial labs - Na 134, K 5.2, CO2 19, glucose 209, Sr Cr 1.22, AG 15, albumin 1.8, Phos 4, Mg 1.9, troponin 43, lactic acid 4.4, WBC 16.1, Hgb 9.7, platelets 368. She was emergently taken for CT Head and CTA Chest.  CT head was negative for acute process. However, CTA chest demonstrated a large pulmonary embolism.  She was treated with full dose tPA for hemodynamically significant PE.   PCCM called for ICU admission.   Past Medical History  IDDM  HTN HLD CAD  GERD  Anemia  Thyroid Disorder   Significant Hospital Events   11/25 Presented to [facility] ER with AMS, questionable syncopal, hypotensive, bradycardic   Consults:  N/A  Procedures:    Significant Diagnostic Tests:  CTA Chest 11/25 >> positive for saddle PE  CT Head 11/25 >> negative  ECHO 11/25 >>    Micro Data:  COVID 11/25 >>  BCx2 11/25 >>   Antimicrobials:    Interim history/subjective:  No events.  Off pressors.  Objective   Blood pressure (!) 116/47, pulse 80, temperature 98.4 F (36.9 C), temperature source Oral, resp. rate 15, weight 90.2 kg, SpO2 92 %.        Intake/Output Summary (Last 24 hours) at 02/06/2020 2878 Last data filed at 02/06/2020 0600 Gross per 24 hour  Intake 2574.39 ml  Output --  Net 2574.39  ml   Filed Weights   02/06/20 0445  Weight: 90.2 kg    Examination: Constitutional: no acute distress  Eyes: eomi, pupils equal Ears, nose, mouth, and throat: MMM, trachea midline Cardiovascular: RRR, ext warm Respiratory: Clear, no wheezing, no accessory muscle use Gastrointestinal: Soft, +BS Skin: No rashes, normal turgor Neurologic: moves all 4 ext to command Psychiatric: AOx3   Renal function looks good Mild drop in [facility]  Resolved Hospital Problem list     Assessment & Plan:   Acute Massive Pulmonary Embolism with Obstructive Shock- tpa 02/05/20 - Transition to NoAC - Recommend indefinite NoAC  Recent hyperthyroidism - Continue methimazole, thyroid labs look okay  Hx htn- hold while on pressors  DM2 with Hyperglycemia- SSI, hold further levemir  Mild ABLA after tpa likely GI source- PPI, monitor, slow drop - If recurrent as OP could consider endoscopy  ICU maintenance- remove central line, remove arterial line, okay for transfer to tele.  Remaining issues: - PT/OT eval - H/H stability  Appreciate [facility] taking over care starting 11/28  Myrla Halsted MD PCCM

## 2020-02-07 ENCOUNTER — Inpatient Hospital Stay (HOSPITAL_COMMUNITY): Payer: Medicare Other

## 2020-02-07 DIAGNOSIS — I2609 Other pulmonary embolism with acute cor pulmonale: Secondary | ICD-10-CM

## 2020-02-07 DIAGNOSIS — D649 Anemia, unspecified: Secondary | ICD-10-CM

## 2020-02-07 DIAGNOSIS — I10 Essential (primary) hypertension: Secondary | ICD-10-CM

## 2020-02-07 LAB — RETICULOCYTES
Immature Retic Fract: 30.4 % — ABNORMAL HIGH (ref 2.3–15.9)
RBC.: 3.16 MIL/uL — ABNORMAL LOW (ref 3.87–5.11)
Retic Count, Absolute: 93.5 10*3/uL (ref 19.0–186.0)
Retic Ct Pct: 3 % (ref 0.4–3.1)

## 2020-02-07 LAB — CBC
HCT: 22.7 % — ABNORMAL LOW (ref 36.0–46.0)
HCT: 24.6 % — ABNORMAL LOW (ref 36.0–46.0)
Hemoglobin: 7.4 g/dL — ABNORMAL LOW (ref 12.0–15.0)
Hemoglobin: 7.9 g/dL — ABNORMAL LOW (ref 12.0–15.0)
MCH: 25.3 pg — ABNORMAL LOW (ref 26.0–34.0)
MCH: 25.1 pg — ABNORMAL LOW (ref 26.0–34.0)
MCHC: 32.6 g/dL (ref 30.0–36.0)
MCHC: 32.1 g/dL (ref 30.0–36.0)
MCV: 77.5 fL — ABNORMAL LOW (ref 80.0–100.0)
MCV: 78.1 fL — ABNORMAL LOW (ref 80.0–100.0)
Platelets: 276 10*3/uL (ref 150–400)
Platelets: 307 10*3/uL (ref 150–400)
RBC: 2.93 MIL/uL — ABNORMAL LOW (ref 3.87–5.11)
RBC: 3.15 MIL/uL — ABNORMAL LOW (ref 3.87–5.11)
RDW: 18 % — ABNORMAL HIGH (ref 11.5–15.5)
RDW: 18.1 % — ABNORMAL HIGH (ref 11.5–15.5)
WBC: 8.3 10*3/uL (ref 4.0–10.5)
WBC: 10.2 10*3/uL (ref 4.0–10.5)
nRBC: 0.2 % (ref 0.0–0.2)
nRBC: 0 % (ref 0.0–0.2)

## 2020-02-07 LAB — BASIC METABOLIC PANEL
Anion gap: 11 (ref 5–15)
BUN: 14 mg/dL (ref 8–23)
CO2: 25 mmol/L (ref 22–32)
Calcium: 8.2 mg/dL — ABNORMAL LOW (ref 8.9–10.3)
Chloride: 100 mmol/L (ref 98–111)
Creatinine, Ser: 1.08 mg/dL — ABNORMAL HIGH (ref 0.44–1.00)
GFR, Estimated: 50 mL/min — ABNORMAL LOW (ref >60)
Glucose, Bld: 147 mg/dL — ABNORMAL HIGH (ref 70–99)
Potassium: 3.9 mmol/L (ref 3.5–5.1)
Sodium: 136 mmol/L (ref 135–145)

## 2020-02-07 LAB — GLUCOSE, CAPILLARY
Glucose-Capillary: 135 mg/dL — ABNORMAL HIGH (ref 70–99)
Glucose-Capillary: 161 mg/dL — ABNORMAL HIGH (ref 70–99)
Glucose-Capillary: 158 mg/dL — ABNORMAL HIGH (ref 70–99)
Glucose-Capillary: 138 mg/dL — ABNORMAL HIGH (ref 70–99)

## 2020-02-07 LAB — FERRITIN: Ferritin: 946 ng/mL — ABNORMAL HIGH (ref 11–307)

## 2020-02-07 LAB — IRON AND TIBC
Iron: 18 ug/dL — ABNORMAL LOW (ref 28–170)
Saturation Ratios: 15 % (ref 10.4–31.8)
TIBC: 123 ug/dL — ABNORMAL LOW (ref 250–450)
UIBC: 105 ug/dL

## 2020-02-07 LAB — VITAMIN B12: Vitamin B-12: 1375 pg/mL — ABNORMAL HIGH (ref 180–914)

## 2020-02-07 LAB — FOLATE: Folate: 14.9 ng/mL (ref >5.9)

## 2020-02-07 MED ORDER — FERROUS SULFATE 325 (65 FE) MG PO TABS
325.0000 mg | ORAL_TABLET | Freq: Two times a day (BID) | ORAL | Status: DC
  Administered 2020-02-07 – 2020-02-11 (×9): 325 mg via ORAL
  Filled 2020-02-07 (×9): qty 1

## 2020-02-07 NOTE — Progress Notes (Signed)
PROGRESS NOTE        PATIENT DETAILS Name: Rebecca [name] Age: 86 y.o. Sex: female Date of Birth: February 02, 1933 Admit Date: 02/04/2020 Admitting Physician Lorin Glass, MD URK:YHCWC, [name], MD  Brief Narrative: Patient is a 86 y.o. female history of hypothyroidism, chronic diastolic heart failure, DM-2, CAD s/p remote PCI, PAF, spinal stenosis, CKD stage IIIa, HTN-presented to the hospital on 11/25 from a SNF after a syncopal episode-further evaluation revealed a massive pulmonary embolism-she was given TPA-and admitted to the ICU by PCCM. Upon further stability-she was transferred to [facility]. See below for further details.  Significant events: 11/25>> admit to ICU-syncope/hypotensive/bradycardic-massive PE-given TPA. 11/28>> transfer to [name]  Significant studies: 11/25>> CT head: No acute intracranial findings. 11/25>> CTA chest: Extensive pulmonary emboli, CT evidence of right heart strain, multinodular goiter 11/26>> Echo: EF 60-65%, right ventricular systolic function is normal, PA pressure of 46.6.  Antimicrobial therapy: None  Microbiology data: 11/25>> blood cultures: No growth  Procedures : None  Consults: PCCM  DVT Prophylaxis : apixaban (ELIQUIS) tablet 5 mg    Subjective: Lying comfortably in bed-no major issues overnight. Claims she has not had a bowel movement in the past few days.  Assessment/Plan: Massive PE with obstructive shock: S/p TPA on admission-shock is resolved-BP now stable. Has been transitioned to Eliquis. Obtain lower extremity Doppler ultrasound.  Microcytic anemia: Iron indices consistent with iron deficiency-patient not sure when her last colonoscopy was. Start iron supplementation-check FOBT. Hemoglobin slowly trending down-concern for slow/oozing from a GI tract-however patient apparently has not had a bowel movement in the past few days. Recheck hemoglobin later today-May require a  PRBC transfusion at some point.  Hyperthyroidism: On methimazole-follows with endocrinology in the outpatient setting. Defer further adjustments to endocrinology.  DM-2 (A1c 6.5 on 10/24): CBG stable with SSI   Recent Labs    02/06/20 1658 02/06/20 2117 02/07/20 0730  GLUCAP 170* 116* 135*   HTN: BP currently stable-all antihypertensives on hold-resume when able.  HLD: Continue statin  History of CAD: No anginal symptoms-we will stop aspirin given she is now on anticoagulation with Eliquis-and worsening anemia.  CKD stage IIIa: Creatinine close to baseline-follow periodically.  OA Right knee: Claims to have pain in her right knee-ongoing for the past several weeks-check x-rays.  Debility/deconditioning: Await PT/OT eval.   Obesity: Estimated body mass index is 35.23 kg/m as calculated from the following:   Height as of 02/01/20: 5\' 3"  (1.6 m).   Weight as of this encounter: 90.2 kg.    Diet: Diet Order            Diet Heart Room service appropriate? Yes; Fluid consistency: Thin  Diet effective now                  Code Status: Full code   Family Communication: Son [phone] a voicemail on 11/28  Disposition Plan: Status is: Inpatient  Remains inpatient appropriate because:Inpatient level of care appropriate due to severity of illness   Dispo: The patient is from: SNF              Anticipated d/c is to: SNF              Anticipated d/c date is: 2 days              Patient currently is not  medically stable to d/c.   Barriers to Discharge: Worsening hemoglobin requiring frequent CBC monitoring-awaiting PT/OT evaluation to determine safe disposition.  Antimicrobial agents: Anti-infectives (From admission, onward)   Start     Dose/Rate Route Frequency Ordered Stop   02/05/20 1500  vancomycin (VANCOREADY) IVPB 1250 mg/250 mL  Status:  Discontinued        1,250 mg 166.7 mL/hr over 90 Minutes Intravenous Every 24 hours 02/04/20 1458 02/04/20 1502    02/04/20 1500  ceFEPIme (MAXIPIME) 2 g in sodium chloride 0.9 % 100 mL IVPB  Status:  Discontinued        2 g 200 mL/hr over 30 Minutes Intravenous Every 12 hours 02/04/20 1458 02/04/20 1502   02/04/20 1500  vancomycin (VANCOREADY) IVPB 1750 mg/350 mL  Status:  Discontinued        1,750 mg 175 mL/hr over 120 Minutes Intravenous  Once 02/04/20 1458 02/04/20 1502   02/04/20 1430  cefTRIAXone (ROCEPHIN) 2 g in sodium chloride 0.9 % 100 mL IVPB  Status:  Discontinued        2 g 200 mL/hr over 30 Minutes Intravenous  Once 02/04/20 1419 02/04/20 1519       Time spent: 25 minutes-Greater than 50% of this time was spent in counseling, explanation of diagnosis, planning of further management, and coordination of care.  MEDICATIONS: Scheduled Meds: . apixaban  5 mg Oral BID  . atorvastatin  10 mg Oral Daily  . brimonidine  1 drop Right Eye BID  . chlorhexidine  15 mL Mouth Rinse BID  . Chlorhexidine Gluconate Cloth  6 each Topical Daily  . dorzolamide-timolol  1 drop Right Eye BID  . gabapentin  300 mg Oral Daily  . insulin aspart  0-15 Units Subcutaneous TID WC  . insulin aspart  0-5 Units Subcutaneous QHS  . latanoprost  1 drop Right Eye QHS  . mouth rinse  15 mL Mouth Rinse q12n4p  . methimazole  10 mg Oral BID  . pantoprazole  40 mg Oral BID  . sodium chloride flush  10-40 mL Intracatheter Q12H   Continuous Infusions: PRN Meds:.docusate sodium, polyethylene glycol, sodium chloride flush   PHYSICAL EXAM: Vital signs: Vitals:   02/06/20 1800 02/06/20 1903 02/06/20 2112 02/07/20 0604  BP: 122/66 131/60 (!) 140/57 (!) 149/80  Pulse: 75  71 76  Resp: 17 17 16 17   Temp:  99.6 F (37.6 C) 99.7 F (37.6 C) 100.1 F (37.8 C)  TempSrc:  Oral Oral Oral  SpO2: 96% 96% 97% 95%  Weight:       Filed Weights   02/06/20 0445  Weight: 90.2 kg   Body mass index is 35.23 kg/m.   Gen Exam:Alert awake-not in any distress HEENT:atraumatic, normocephalic Chest: B/L clear to  auscultation anteriorly CVS:S1S2 regular Abdomen:soft non tender, non distended Extremities:no edema. Right knee-tender-but no obvious swelling/erythema  Neurology: Non focal Skin: no rash  I have personally reviewed following labs and imaging studies  LABORATORY DATA: CBC: Recent Labs  Lab 02/04/20 1325 02/04/20 1357 02/04/20 1620 02/04/20 1624 02/05/20 0026 02/05/20 0459 02/05/20 1359 02/06/20 0439 02/07/20 0321  WBC 16.1*   < > 15.2*  --   --  12.8* 11.8* 10.2 8.3  NEUTROABS 11.3*  --   --   --   --   --   --   --   --   HGB 9.7*   < > 8.1*   < > 8.7* 8.6* 8.2* 7.9* 7.4*  HCT 31.1*   < >  24.7*   < > 26.6* 26.7* 25.8* 24.4* 22.7*  MCV 80.4   < > 76.9*  --   --  77.6* 77.7* 79.0* 77.5*  PLT 368   < > 254  --   --  342 324 315 276   < > = values in this interval not displayed.    Basic Metabolic Panel: Recent Labs  Lab 02/04/20 1325 02/04/20 1357 02/04/20 1620 02/04/20 1620 02/04/20 1624 02/05/20 0459 02/05/20 1359 02/06/20 0439 02/07/20 0321  NA 134*   < > 140   < > 140 140 137 136 136  K 5.2*   < > 4.4   < > 4.5 4.0 3.9 3.9 3.9  CL 100   < > 100  --   --  101 101 99 100  CO2 19*   < > 26  --   --  25 25 26 25   GLUCOSE 209*   < > 227*  --   --  160* 179* 82 147*  BUN 18   < > 19  --   --  21 20 18 14   CREATININE 1.22*   < > 1.10*  --   --  1.21* 1.22* 1.11* 1.08*  CALCIUM 8.8*   < > 9.4  --   --  8.6* 8.1* 8.0* 8.2*  MG 1.9  --   --   --   --  1.8  --  2.0  --   PHOS 4.0  --   --   --   --  4.4  --  3.4  --    < > = values in this interval not displayed.    GFR: Estimated Creatinine Clearance: 40.6 mL/min (A) (by C-G formula based on SCr of 1.08 mg/dL (H)).  Liver Function Tests: Recent Labs  Lab 02/04/20 1325 02/05/20 0459 02/06/20 0439  AST 20 28 23   ALT 11 16 15   ALKPHOS 93 129* 110  BILITOT 0.7 0.7 0.4  PROT 5.7* 4.7* 4.4*  ALBUMIN 1.8* 1.4* 1.4*   No results for input(s): LIPASE, AMYLASE in the last 168 hours. No results for input(s):  AMMONIA in the last 168 hours.  Coagulation Profile: Recent Labs  Lab 02/04/20 1325 02/04/20 1620 02/05/20 0459  INR 1.2 2.0* 1.9*    Cardiac Enzymes: No results for input(s): CKTOTAL, CKMB, CKMBINDEX, TROPONINI in the last 168 hours.  BNP (last 3 results) No results for input(s): PROBNP in the last 8760 hours.  Lipid Profile: No results for input(s): CHOL, HDL, LDLCALC, TRIG, CHOLHDL, LDLDIRECT in the last 72 hours.  Thyroid Function Tests: Recent Labs    02/04/20 1325 02/04/20 2200  TSH 1.570  --   FREET4 1.28*  --   T3FREE  --  1.9*    Anemia Panel: Recent Labs    02/07/20 0658  VITAMINB12 1,375*  FOLATE 14.9  FERRITIN 946*  TIBC 123*  IRON 18*  RETICCTPCT 3.0    Urine analysis:    Component Value Date/Time   COLORURINE YELLOW 01/02/2020 2251   APPEARANCEUR CLEAR 01/02/2020 2251   LABSPEC 1.011 01/02/2020 2251   PHURINE 5.0 01/02/2020 2251   GLUCOSEU 50 (A) 01/02/2020 2251   HGBUR SMALL (A) 01/02/2020 2251   BILIRUBINUR NEGATIVE 01/02/2020 2251   KETONESUR NEGATIVE 01/02/2020 2251   PROTEINUR NEGATIVE 01/02/2020 2251   NITRITE POSITIVE (A) 01/02/2020 2251   LEUKOCYTESUR NEGATIVE 01/02/2020 2251    Sepsis Labs: Lactic Acid, Venous    Component Value Date/Time   LATICACIDVEN 3.8 (  HH) 02/04/2020 1620    MICROBIOLOGY: Recent Results (from the past 240 hour(s))  Blood culture (routine x 2)     Status: None (Preliminary result)   Collection Time: 02/04/20  1:44 PM   Specimen: BLOOD  Result Value Ref Range Status   Specimen Description BLOOD SITE NOT SPECIFIED  Final   Special Requests   Final    BOTTLES DRAWN AEROBIC AND ANAEROBIC Blood Culture results may not be optimal due to an inadequate volume of blood received in culture bottles   Culture   Final    NO GROWTH 3 DAYS Performed at [facility], [address], [place], [place] [address]    Report Status PENDING  Incomplete  Blood culture (routine x 2)     Status: None (Preliminary  result)   Collection Time: 02/04/20  1:49 PM   Specimen: BLOOD  Result Value Ref Range Status   Specimen Description BLOOD SITE NOT SPECIFIED  Final   Special Requests   Final    BOTTLES DRAWN AEROBIC AND ANAEROBIC Blood Culture results may not be optimal due to an inadequate volume of blood received in culture bottles   Culture   Final    NO GROWTH 3 DAYS Performed at [facility], [address], [place], [place] [address]    Report Status PENDING  Incomplete  Resp Panel by RT-PCR (Flu A&B, Covid) Nasopharyngeal Swab     Status: None   Collection Time: 02/04/20  3:04 PM   Specimen: Nasopharyngeal Swab; Nasopharyngeal(NP) swabs in vial transport medium  Result Value Ref Range Status   SARS Coronavirus 2 by RT PCR NEGATIVE NEGATIVE Final    Comment: (NOTE) SARS-CoV-2 target nucleic acids are NOT DETECTED.  The SARS-CoV-2 RNA is generally detectable in upper respiratory specimens during the acute phase of infection. The lowest concentration of SARS-CoV-2 viral copies this assay can detect is 138 copies/mL. A negative result does not preclude SARS-Cov-2 infection and should not be used as the sole basis for treatment or other patient management decisions. A negative result may occur with  improper specimen collection/handling, submission of specimen other than nasopharyngeal swab, presence of viral mutation(s) within the areas targeted by this assay, and inadequate number of viral copies(<138 copies/mL). A negative result must be combined with clinical observations, patient history, and epidemiological information. The expected result is Negative.  Fact Sheet for Patients:  BloggerCourse.com  Fact Sheet for Healthcare Providers:  SeriousBroker.it  This test is no t yet approved or cleared by the [place] FDA and  has been authorized for detection and/or diagnosis of SARS-CoV-2 by FDA under an Emergency Use  Authorization (EUA). This EUA will remain  in effect (meaning this test can be used) for the duration of the COVID-19 declaration under Section 564(b)(1) of the Act, 21 U.S.C.section 360bbb-3(b)(1), unless the authorization is terminated  or revoked sooner.       Influenza A by PCR NEGATIVE NEGATIVE Final   Influenza B by PCR NEGATIVE NEGATIVE Final    Comment: (NOTE) The Xpert Xpress SARS-CoV-2/FLU/RSV plus assay is intended as an aid in the diagnosis of influenza from Nasopharyngeal swab specimens and should not be used as a sole basis for treatment. Nasal washings and aspirates are unacceptable for Xpert Xpress SARS-CoV-2/FLU/RSV testing.  Fact Sheet for Patients: BloggerCourse.com  Fact Sheet for Healthcare Providers: SeriousBroker.it  This test is not yet approved or cleared by the [place] FDA and has been authorized for detection and/or diagnosis of SARS-CoV-2  by FDA under an Emergency Use Authorization (EUA). This EUA will remain in effect (meaning this test can be used) for the duration of the COVID-19 declaration under Section 564(b)(1) of the Act, 21 U.S.C. section 360bbb-3(b)(1), unless the authorization is terminated or revoked.  Performed at [facility], [address] [place], [place] [address]   MRSA PCR Screening     Status: None   Collection Time: 02/04/20  5:44 PM   Specimen: Nasal Mucosa; Nasopharyngeal  Result Value Ref Range Status   MRSA by PCR NEGATIVE NEGATIVE Final    Comment:        The GeneXpert MRSA Assay (FDA approved for NASAL specimens only), is one component of a comprehensive MRSA colonization surveillance program. It is not intended to diagnose MRSA infection nor to guide or monitor treatment for MRSA infections. Performed at [facility], [address] [place], [place] [address]     RADIOLOGY STUDIES/RESULTS: No results found.   LOS: 3 days   Jeoffrey Massed, MD  [facility]    To contact the attending provider between 7A-7P or the covering provider during after hours 7P-7A, please log into the web site [email or web address] and access using universal [place] password for that web site. If you do not have the password, please call the hospital operator.  02/07/2020, 9:15 AM

## 2020-02-07 NOTE — Evaluation (Signed)
Occupational Therapy Evaluation Patient Details Name: Rebecca [name] MRN: [id number] DOB: Dec 15, 1932 Today's Date: 02/07/2020    History of Present Illness 86 y/o F admitted 11/25 with syncopal episode, hypotension and bradycardia.  CTA positive for large pulmonary embolism, s/p tPA for hemodynamically significant PE. CT head was negative for acute process. PMH-DM, HTN, HLD, CAD, anemia   Clinical Impression   [facility]. She has been there ~ one month. She reports having to use a slide board for transfers and required assist from facility for ADL. At time of eval, pt is largely limited by R knee pain. She currently requires max A +2 for rolling in bed for repositioning. Pt crying out in pain and begging OT not to touch R knee. Also noted cognitive deficits in problem solving, situation, and STM but unsure of baseline. Given current status, recommend pt d/c to SNF. She made it clear that she does not want to return to the same SNF and would like to pursue other options. OT will continue to follow per POC listed below.     Follow Up Recommendations  SNF    Equipment Recommendations  3 in 1 bedside commode;Wheelchair (measurements OT);Wheelchair cushion (measurements OT);Hospital bed;Other (comment) (hoyer lift with lift pads)    Recommendations for Other Services       Precautions / Restrictions Precautions Precautions: Fall Precaution Comments: extremely painful Rknee with any mobility Required Braces or Orthoses: Other Brace Other Brace: bil prevalaon boots Restrictions Weight Bearing Restrictions: No      Mobility Bed Mobility Overal bed mobility: Needs Assistance Bed Mobility: Rolling Rolling: Max assist;+2 for physical assistance;+2 for safety/equipment         General bed mobility comments: pt rolling for general repositoning of lines and in attempts to reduce pain. Required total A +2 to scoot up to [facility] with bed pads. Unable  to tolerate advancing BLEs to EOB to sit up.    Transfers                 General transfer comment: not able due to pain    Balance                                           ADL either performed or assessed with clinical judgement   ADL Overall ADL's : Needs assistance/impaired Eating/Feeding: Set up;Sitting   Grooming: Set up;Sitting                                 General ADL Comments: Pt is otherwise max-total A for bed level ADL due to pain. not able to tolerate OOB mobility this date     Vision Patient Visual Report: No change from baseline       Perception     Praxis      Pertinent Vitals/Pain Pain Assessment: Faces Faces Pain Scale: Hurts whole lot Pain Location: R knee with any mobility; L leg with positioning Pain Descriptors / Indicators: Aching;Grimacing;Guarding;Sore;Moaning Pain Intervention(s): Limited activity within patient's tolerance;Monitored during session;Repositioned     Hand Dominance     Extremity/Trunk Assessment Upper Extremity Assessment Upper Extremity Assessment: Generalized weakness   Lower Extremity Assessment Lower Extremity Assessment: Defer to PT evaluation       Communication Communication Communication: No difficulties   Cognition Arousal/Alertness: Awake/alert  Behavior During Therapy: WFL for tasks assessed/performed Overall Cognitive Status: No family/caregiver present to determine baseline cognitive functioning Area of Impairment: Orientation;Memory;Safety/judgement;Problem solving                 Orientation Level: Situation (questionable historian about recent medical events)   Memory: Decreased short-term memory   Safety/Judgement: Decreased awareness of deficits   Problem Solving: Slow processing;Requires verbal cues General Comments: a questionable historian about prior level of function and medical conditions. Poor awareness of deficits (ex. does not understand  why sitting upright is more helpful). Requires increased time to process basic commands   General Comments       Exercises     Shoulder Instructions      Home Living Family/patient expects to be discharged to:: Skilled nursing facility                                 Additional Comments: Pt is from [facility]      Prior Functioning/Environment Level of Independence: Needs assistance  Gait / Transfers Assistance Needed: Pt reports she has been using slide board to get out of bed ADL's / Homemaking Assistance Needed: Requires assist from staff to manage ADL            OT Problem List: Decreased strength;Decreased knowledge of use of DME or AE;Decreased knowledge of precautions;Decreased activity tolerance;Decreased cognition;Cardiopulmonary status limiting activity;Impaired balance (sitting and/or standing);Decreased safety awareness;Pain      OT Treatment/Interventions: Self-care/ADL training;Therapeutic exercise;Patient/family education;Balance training;Energy conservation;Therapeutic activities;DME and/or AE instruction    OT Goals(Current goals can be found in the care plan section) Acute Rehab OT Goals Patient Stated Goal: not return to [facility] OT Goal Formulation: With patient Time For Goal Achievement: 02/21/20 Potential to Achieve Goals: Good  OT Frequency: Min 2X/week   Barriers to D/C:            Co-evaluation PT/OT/SLP Co-Evaluation/Treatment: Yes Reason for Co-Treatment: For patient/therapist safety;To address functional/ADL transfers   OT goals addressed during session: ADL's and self-care;Strengthening/ROM      AM-PAC OT "6 Clicks" Daily Activity     Outcome Measure Help from another person eating meals?: A Little Help from another person taking care of personal grooming?: A Little Help from another person toileting, which includes using toliet, bedpan, or urinal?: Total Help from another person bathing (including washing, rinsing,  drying)?: Total Help from another person to put on and taking off regular upper body clothing?: A Lot Help from another person to put on and taking off regular lower body clothing?: Total 6 Click Score: 11   End of Session Equipment Utilized During Treatment: Oxygen Nurse Communication: Mobility status  Activity Tolerance: Patient limited by pain Patient left: in bed;with call bell/phone within reach;with bed alarm set  OT Visit Diagnosis: Unsteadiness on feet (R26.81);Other abnormalities of gait and mobility (R26.89);Muscle weakness (generalized) (M62.81);Pain;Other symptoms and signs involving cognitive function Pain - Right/Left: Right Pain - part of body: Knee                Time: 1000-1020 OT Time Calculation (min): 20 min Charges:  OT General Charges $OT Visit: 1 Visit OT Evaluation $OT Eval Moderate Complexity: 1 Mod  Dalphine Handing, MSOT, OTR/L Acute Rehabilitation Services [facility] Office Number: [phone] Pager: [phone]  Dalphine Handing 02/07/2020, 10:38 AM

## 2020-02-07 NOTE — NC FL2 (Signed)
[place] MEDICAID FL2 LEVEL OF CARE SCREENING TOOL     IDENTIFICATION  Patient Name: Rebecca [name] Birthdate: 1932-06-05 Sex: female Admission Date (Current Location): 02/04/2020  [facility] and [place] Number:  Producer, television/film/video and Address:  [facility], [address] [place], [place] [address]      Provider Number: [id number]  Attending Physician Name and Address:  Maretta Bees, MD  Relative Name and Phone Number:       Current Level of Care: Hospital Recommended Level of Care: Skilled Nursing Facility Prior Approval Number:    Date Approved/Denied:   PASRR Number: [id number]  Discharge Plan: SNF    Current Diagnoses: Patient Active Problem List   Diagnosis Date Noted  . Shock (HCC) 02/04/2020  . Paroxysmal A-fib (HCC) 01/12/2020  . Thyroid storm 01/06/2020  . Spinal stenosis 01/06/2020  . Chronic kidney disease, stage 3a (HCC) 01/03/2020  . Microcytic anemia 01/03/2020  . Acute on chronic diastolic CHF (congestive heart failure) (HCC) 01/03/2020  . Physical deconditioning 01/03/2020  . Generalized osteoarthritis of hand 06/01/2019  . Pressure sore, right foot 06/01/2019  . Peripheral neuropathy 06/01/2019  . Degenerative arthritis of right knee 12/15/2018  . Knee osteoarthritis 09/08/2018  . Pes planus 12/01/2017  . Foot deformity, acquired, left 11/04/2017  . Degenerative disc disease, lumbar 08/01/2016  . Glaucoma 07/23/2016  . Numbness 04/23/2016  . Chronic pain of right knee 12/28/2015  . Lower back pain 10/24/2015  . Pain in both feet 10/24/2015  . Abnormality of gait 04/01/2013  . Posterior tibial tendon dysfunction 04/01/2013  . Diabetes (HCC) 04/01/2013  . Ankle pain, left 10/20/2010  . UNSPECIFIED VITAMIN D DEFICIENCY 11/08/2008  . EDEMA- LOCALIZED 11/08/2008  . Sickle-cell trait (HCC) 01/01/2008  . Esophageal reflux 09/02/2007  . GOITER, MULTINODULAR 11/26/2006  . Coronary artery disease involving  native coronary artery of native heart without angina pectoris 11/26/2006  . HIATAL HERNIA 11/26/2006  . FIBROCYSTIC BREAST DISEASE 07/31/2006  . HYPERCHOLESTEROLEMIA 05/27/2006  . Essential hypertension 05/27/2006    Orientation RESPIRATION BLADDER Height & Weight     Self, Time, Situation, Place  Normal Incontinent Weight: 198 lb 13.7 oz (90.2 kg) Height:     BEHAVIORAL SYMPTOMS/MOOD NEUROLOGICAL BOWEL NUTRITION STATUS      Incontinent Diet  AMBULATORY STATUS COMMUNICATION OF NEEDS Skin   Extensive Assist Verbally PU Stage and Appropriate Care, Skin abrasions                       Personal Care Assistance Level of Assistance  Bathing, Feeding, Dressing Bathing Assistance: Maximum assistance Feeding assistance: Limited assistance Dressing Assistance: Maximum assistance     Functional Limitations Info  Sight, Hearing, Speech Sight Info: Adequate Hearing Info: Adequate Speech Info: Adequate    SPECIAL CARE FACTORS FREQUENCY  PT (By licensed PT), OT (By licensed OT)     PT Frequency: 5x a week OT Frequency: 5x a week            Contractures Contractures Info: Not present    Additional Factors Info  Code Status, Allergies Code Status Info: Full Allergies Info: Codeine           Current Medications (02/07/2020):  This is the current hospital active medication list Current Facility-Administered Medications  Medication Dose Route Frequency Provider Last Rate Last Admin  . apixaban (ELIQUIS) tablet 5 mg  5 mg Oral BID Lorin Glass, MD   5 mg at 02/07/20 1056  .  atorvastatin (LIPITOR) tablet 10 mg  10 mg Oral Daily Lorin Glass, MD   10 mg at 02/07/20 1056  . brimonidine (ALPHAGAN) 0.2 % ophthalmic solution 1 drop  1 drop Right Eye BID Lorin Glass, MD   1 drop at 02/07/20 1056  . chlorhexidine (PERIDEX) 0.12 % solution 15 mL  15 mL Mouth Rinse BID Lorin Glass, MD   15 mL at 02/07/20 1055  . Chlorhexidine Gluconate Cloth 2 % PADS 6 each  6 each  Topical Daily Lorin Glass, MD   6 each at 02/07/20 1056  . docusate sodium (COLACE) capsule 100 mg  100 mg Oral BID PRN Karie Fetch P, DO      . dorzolamide-timolol (COSOPT) 22.3-6.8 MG/ML ophthalmic solution 1 drop  1 drop Right Eye BID Lorin Glass, MD   1 drop at 02/07/20 1057  . ferrous sulfate tablet 325 mg  325 mg Oral BID WC Ghimire, Shanker M, MD      . gabapentin (NEURONTIN) capsule 300 mg  300 mg Oral Daily Lorin Glass, MD   300 mg at 02/07/20 1056  . insulin aspart (novoLOG) injection 0-15 Units  0-15 Units Subcutaneous TID WC Lorin Glass, MD   3 Units at 02/07/20 1228  . insulin aspart (novoLOG) injection 0-5 Units  0-5 Units Subcutaneous QHS Lorin Glass, MD      . latanoprost (XALATAN) 0.005 % ophthalmic solution 1 drop  1 drop Right Eye QHS Lorin Glass, MD   1 drop at 02/06/20 2134  . MEDLINE mouth rinse  15 mL Mouth Rinse q12n4p Lorin Glass, MD   15 mL at 02/06/20 1333  . methimazole (TAPAZOLE) tablet 10 mg  10 mg Oral BID Lorin Glass, MD   10 mg at 02/07/20 1055  . pantoprazole (PROTONIX) EC tablet 40 mg  40 mg Oral BID Lorin Glass, MD   40 mg at 02/07/20 1056  . polyethylene glycol (MIRALAX / GLYCOLAX) packet 17 g  17 g Oral Daily PRN Karie Fetch P, DO      . sodium chloride flush (NS) 0.9 % injection 10-40 mL  10-40 mL Intracatheter Q12H Lorin Glass, MD   10 mL at 02/07/20 1057  . sodium chloride flush (NS) 0.9 % injection 10-40 mL  10-40 mL Intracatheter PRN Lorin Glass, MD         Discharge Medications: Please see discharge summary for a list of discharge medications.  Relevant Imaging Results:  Relevant Lab Results:   Additional Information SS#[id number], [place]

## 2020-02-07 NOTE — Progress Notes (Signed)
Rebecca [name] [id number] Admission Data: 02/07/2020 7:03 AM Attending Provider: Maretta Bees, MD  XUX:YBFXO, [name], MD  [name] is a 85 y.o. female patient transferred from 57M, awake, alert  & orientated  X 4,  Full Code, VSS - 131/60, 96% on 3L O2 No c/o shortness of breath, no c/o chest pain, no distress noted. Tele # [id number] placed and pt is currently running:normal sinus rhythm.  Pt orientation to unit, room and routine. Information packet given to patient/family.  Admission INP armband ID verified with patient/family, and in place. SR up x 2, fall risk assessment complete with patient and family verbalizing understanding of risks associated with falls. Pt verbalizes an understanding of how to use the call bell and to call for help before getting out of bed.   Will continue to monitor and assist as needed.  Lyndal Pulley, RN 02/07/2020 18:57

## 2020-02-07 NOTE — Evaluation (Signed)
Physical Therapy Evaluation Patient Details Name: Rebecca [name] MRN: [id number] DOB: 03/24/1932 Today's Date: 02/07/2020   History of Present Illness  86 y/o F admitted 11/25 with syncopal episode, hypotension and bradycardia.  CTA positive for large pulmonary embolism, s/p tPA for hemodynamically significant PE. CT head was negative for acute process. PMH-DM, HTN, HLD, CAD, anemia  Clinical Impression   Pt admitted with above diagnosis. Patient reports working on Hydrologist transfers bed to wheelchair at [facility] (admitted 01/11/20 from hospital). Currently she is very limited by severe rt knee pain (xrays pending). Noted h/o severe rt arthritis with pt reporting this current pain is much worse/more severe. Limited to bed level evaluation as knee xray is pending.  Pt currently with functional limitations due to the deficits listed below (see PT Problem List). Pt will benefit from skilled PT to increase their independence and safety with mobility to allow discharge to the venue listed below.      Follow Up Recommendations SNF;Supervision/Assistance - 24 hour (pt does not want to return to [facility])    Geophysical data processor (measurements PT);Wheelchair cushion (measurements PT);Hospital bed;Other (comment) (hoyer lift)    Recommendations for Other Services       Precautions / Restrictions Precautions Precautions: Fall Precaution Comments: extremely painful Rknee with any mobility Required Braces or Orthoses: Other Brace Other Brace: bil prevalaon boots Restrictions Weight Bearing Restrictions: No      Mobility  Bed Mobility Overal bed mobility: Needs Assistance Bed Mobility: Rolling Rolling: Max assist;+2 for physical assistance;+2 for safety/equipment         General bed mobility comments: pt rolling for general repositoning of lines and in attempts to reduce pain. Required total A +2 to scoot up to [facility] with bed pads. Unable to tolerate  advancing BLEs to EOB to sit up.    Transfers                 General transfer comment: not able due to pain; awaiting results rt knee xray  Ambulation/Gait                Stairs            Wheelchair Mobility    Modified Rankin (Stroke Patients Only)       Balance                                             Pertinent Vitals/Pain Pain Assessment: Faces Faces Pain Scale: Hurts whole lot Pain Location: R knee with any mobility; L leg with positioning Pain Descriptors / Indicators: Aching;Grimacing;Guarding;Sore;Moaning Pain Intervention(s): Limited activity within patient's tolerance;Monitored during session;Repositioned    Home Living Family/patient expects to be discharged to:: Skilled nursing facility Living Arrangements: Alone               Additional Comments: Pt is from [facility] and states she does not want to return there    Prior Function Level of Independence: Needs assistance   Gait / Transfers Assistance Needed: Pt reports she has been using slide board to get out of bed  ADL's / Homemaking Assistance Needed: Requires assist from staff to manage ADL        Hand Dominance        Extremity/Trunk Assessment   Upper Extremity Assessment Upper Extremity Assessment: Defer to OT evaluation    Lower Extremity Assessment  Lower Extremity Assessment: RLE deficits/detail;Generalized weakness;LLE deficits/detail RLE Deficits / Details: pt with poor tolerance of touch to RLE due to knee pain; she initially reports severe pain began two days ago, then questioned why she was using a sliding board instead of standing and pt unclear if due to knee pain RLE: Unable to fully assess due to pain RLE Sensation: decreased light touch (per medical record) LLE Deficits / Details: AAROM left hip/knee flexion to 40 degrees (cries out in pain from RLE although only moving LLE); in supine, in external rotation with inability to  achieve neutral rotation LLE: Unable to fully assess due to pain LLE Sensation: decreased light touch (per medical record)    Cervical / Trunk Assessment Cervical / Trunk Assessment: Other exceptions Cervical / Trunk Exceptions: overweight  Communication   Communication: No difficulties  Cognition Arousal/Alertness: Awake/alert Behavior During Therapy: WFL for tasks assessed/performed Overall Cognitive Status: No family/caregiver present to determine baseline cognitive functioning Area of Impairment: Orientation;Memory;Safety/judgement;Problem solving                 Orientation Level: Situation (questionable historian about recent medical events)   Memory: Decreased short-term memory   Safety/Judgement: Decreased awareness of deficits   Problem Solving: Slow processing;Requires verbal cues General Comments: a questionable historian about prior level of function and medical conditions. Poor awareness of deficits (ex. does not understand why sitting upright is more helpful for her lungs). Requires increased time to process basic commands      General Comments      Exercises     Assessment/Plan    PT Assessment Patient needs continued PT services  PT Problem List Decreased strength;Decreased range of motion;Decreased activity tolerance;Decreased mobility;Decreased cognition;Decreased knowledge of use of DME;Cardiopulmonary status limiting activity;Impaired sensation;Obesity;Pain       PT Treatment Interventions DME instruction;Gait training;Functional mobility training;Therapeutic activities;Therapeutic exercise;Cognitive remediation;Patient/family education;Wheelchair mobility training    PT Goals (Current goals can be found in the Care Plan section)  Acute Rehab PT Goals Patient Stated Goal: not return to [facility] PT Goal Formulation: With patient Time For Goal Achievement: 02/21/20 Potential to Achieve Goals: Fair    Frequency Min 2X/week   Barriers to  discharge Decreased caregiver support      Co-evaluation PT/OT/SLP Co-Evaluation/Treatment: Yes Reason for Co-Treatment: Complexity of the patient's impairments (multi-system involvement);For patient/therapist safety PT goals addressed during session: Mobility/safety with mobility;Strengthening/ROM OT goals addressed during session: ADL's and self-care;Strengthening/ROM       AM-PAC PT "6 Clicks" Mobility  Outcome Measure Help needed turning from your back to your side while in a flat bed without using bedrails?: Total Help needed moving from lying on your back to sitting on the side of a flat bed without using bedrails?: Total Help needed moving to and from a bed to a chair (including a wheelchair)?: Total Help needed standing up from a chair using your arms (e.g., wheelchair or bedside chair)?: Total Help needed to walk in hospital room?: Total Help needed climbing 3-5 steps with a railing? : Total 6 Click Score: 6    End of Session Equipment Utilized During Treatment: Oxygen Activity Tolerance: Patient limited by pain Patient left: in bed;with call bell/phone within reach;with bed alarm set   PT Visit Diagnosis: Pain;Muscle weakness (generalized) (M62.81);Difficulty in walking, not elsewhere classified (R26.2) Pain - Right/Left: Right Pain - part of body: Knee    Time: 1000-1020 PT Time Calculation (min) (ACUTE ONLY): 20 min   Charges:   PT Evaluation $PT Eval Low Complexity:  1 Low           [name], PT Pager [phone]   [name] 02/07/2020, 10:53 AM

## 2020-02-07 NOTE — Social Work (Signed)
CSW unable to get pt on the phone. Per OT, pt does not want to return to [facility].   Jimmy Picket, Theresia Majors, [place] Clinical Social Worker [phone]

## 2020-02-07 NOTE — Progress Notes (Signed)
Spoke with niece today face to face while she was visiting. She stated that patient was living In her own home prior to her "thryoid issue". She lives next door from her son. She states she was high functioning and had been ambulating independently using a walker. She also stated her son is out of town but will be returning tomorrow and hopes to get his mom closer to home, "not [employer] Will continue to monitor this patient.

## 2020-02-08 ENCOUNTER — Inpatient Hospital Stay (HOSPITAL_COMMUNITY): Payer: Medicare Other

## 2020-02-08 DIAGNOSIS — M7989 Other specified soft tissue disorders: Secondary | ICD-10-CM | POA: Diagnosis not present

## 2020-02-08 LAB — CBC
HCT: 25.1 % — ABNORMAL LOW (ref 36.0–46.0)
Hemoglobin: 8.1 g/dL — ABNORMAL LOW (ref 12.0–15.0)
MCH: 25.2 pg — ABNORMAL LOW (ref 26.0–34.0)
MCHC: 32.3 g/dL (ref 30.0–36.0)
MCV: 78 fL — ABNORMAL LOW (ref 80.0–100.0)
Platelets: 338 10*3/uL (ref 150–400)
RBC: 3.22 MIL/uL — ABNORMAL LOW (ref 3.87–5.11)
RDW: 18.1 % — ABNORMAL HIGH (ref 11.5–15.5)
WBC: 10.4 10*3/uL (ref 4.0–10.5)
nRBC: 0 % (ref 0.0–0.2)

## 2020-02-08 LAB — BASIC METABOLIC PANEL
Anion gap: 10 (ref 5–15)
BUN: 12 mg/dL (ref 8–23)
CO2: 25 mmol/L (ref 22–32)
Calcium: 8.2 mg/dL — ABNORMAL LOW (ref 8.9–10.3)
Chloride: 98 mmol/L (ref 98–111)
Creatinine, Ser: 0.93 mg/dL (ref 0.44–1.00)
GFR, Estimated: >60 mL/min (ref >60)
Glucose, Bld: 161 mg/dL — ABNORMAL HIGH (ref 70–99)
Potassium: 3.9 mmol/L (ref 3.5–5.1)
Sodium: 133 mmol/L — ABNORMAL LOW (ref 135–145)

## 2020-02-08 LAB — GLUCOSE, CAPILLARY
Glucose-Capillary: 152 mg/dL — ABNORMAL HIGH (ref 70–99)
Glucose-Capillary: 178 mg/dL — ABNORMAL HIGH (ref 70–99)
Glucose-Capillary: 204 mg/dL — ABNORMAL HIGH (ref 70–99)

## 2020-02-08 NOTE — Progress Notes (Signed)
PROGRESS NOTE        PATIENT DETAILS Name: Rebecca [name] Age: 86 y.o. Sex: female Date of Birth: 1933-01-21 Admit Date: 02/04/2020 Admitting Physician Rebecca Glass, MD NOB:SJGGE, Rebecca Mo, MD  Brief Narrative: Patient is a 86 y.o. female history of hypothyroidism, chronic diastolic heart failure, DM-2, CAD s/p remote PCI, PAF, spinal stenosis, CKD stage IIIa, HTN-presented to the hospital on 11/25 from a SNF after a syncopal episode-further evaluation revealed a massive pulmonary embolism-she was given TPA-and admitted to the ICU by PCCM. Upon further stability-she was transferred to the [facility]. See below for further details.  Significant events: 11/25>> admit to ICU-syncope/hypotensive/bradycardic-massive PE-given TPA. 11/28>> transfer to [facility]  Significant studies: 11/25>> CT head: No acute intracranial findings. 11/25>> CTA chest: Extensive pulmonary emboli, CT evidence of right heart strain, multinodular goiter 11/26>> Echo: EF 60-65%, right ventricular systolic function is normal, PA pressure of 46.6. 11/28>> x-ray right knee: Moderate joint effusion, marked OA changes, chronic calcification in the medial collateral ligament.  Antimicrobial therapy: None  Microbiology data: 11/25>> blood cultures: No growth  Procedures : None  Consults: PCCM  DVT Prophylaxis : apixaban (ELIQUIS) tablet 5 mg    Subjective: Complains of some mild right knee pain-seems to have improved compared to yesterday (per patient this is a chronic issue-she has had 2 arthrocentesis in the past-and does not want me to consult orthopedics for repeat arthrocentesis (son at bedside).  Assessment/Plan: Massive PE with obstructive shock: S/p TPA on admission-shock is resolved-BP now stable. Has been transitioned to Eliquis.  Await lower extremity Doppler ultrasound.  Microcytic anemia: Iron indices consistent with iron deficiency-patient not sure when  her last colonoscopy was.  Reviewed flowsheet-apparently has had brown stools-no overt GI bleeding noted.  Concern for slow oozing from the GI tract-FOBT pending.  Difficult situation-has had life-threatening PE-needs uninterrupted anticoagulation ideally for a few months before proceeding with endoscopic evaluation.  Since not overtly bleeding-plan is to manage medically as possible with close CBC monitoring.  Continue iron supplementation continue PPI-hold off on endoscopic evaluation/GI consultation until anemia severely worsens or patient develops overt GI bleeding.  Hemoglobin currently stable over the past few days.  Hyperthyroidism: On methimazole-follows with endocrinology in the outpatient setting. Defer further adjustments to endocrinology.  DM-2 (A1c 6.5 on 10/24): CBG stable with SSI   Recent Labs    02/07/20 1620 02/07/20 2102 02/08/20 0737  GLUCAP 158* 138* 152*   HTN: BP currently stable-all antihypertensives on hold-resume when able.  HLD: Continue statin  History of CAD: No anginal symptoms-we will stop aspirin given she is now on anticoagulation with Eliquis-and worsening anemia.  CKD stage IIIa: Creatinine close to baseline-follow periodically.  OA Right knee: Claims to have pain in her right knee-ongoing for the past several weeks--x-rays confirm marked OA changes-moderate joint effusion.  As noted above-patient does not want any further arthrocentesis-claims she has had 2 arthrocentesis in the past and really does not want to subject herself to arthrocentesis.  Continue supportive care-this seems to be a chronic issue for the patient.  Debility/deconditioning: Appreciate PT/OT eval-plans are for SNF on discharge.   Obesity: Estimated body mass index is 35.07 kg/m as calculated from the following:   Height as of 02/01/20: 5\' 3"  (1.6 m).   Weight as of this encounter: 89.8 kg.    Diet: Diet Order  Diet Heart Room service appropriate? Yes; Fluid  consistency: Thin  Diet effective now                  Code Status: Full code   Family Communication: Son [phone] a voicemail on 11/28-he was at bedside on 11/29.  Disposition Plan: Status is: Inpatient  Remains inpatient appropriate because:Inpatient level of care appropriate due to severity of illness   Dispo: The patient is from: SNF              Anticipated d/c is to: SNF              Anticipated d/c date is: 2 days              Patient currently is not medically stable to d/c.   Barriers to Discharge: Worsening hemoglobin requiring frequent CBC monitoring-awaiting PT/OT evaluation to determine safe disposition.  Antimicrobial agents: Anti-infectives (From admission, onward)   Start     Dose/Rate Route Frequency Ordered Stop   02/05/20 1500  vancomycin (VANCOREADY) IVPB 1250 mg/250 mL  Status:  Discontinued        1,250 mg 166.7 mL/hr over 90 Minutes Intravenous Every 24 hours 02/04/20 1458 02/04/20 1502   02/04/20 1500  ceFEPIme (MAXIPIME) 2 g in sodium chloride 0.9 % 100 mL IVPB  Status:  Discontinued        2 g 200 mL/hr over 30 Minutes Intravenous Every 12 hours 02/04/20 1458 02/04/20 1502   02/04/20 1500  vancomycin (VANCOREADY) IVPB 1750 mg/350 mL  Status:  Discontinued        1,750 mg 175 mL/hr over 120 Minutes Intravenous  Once 02/04/20 1458 02/04/20 1502   02/04/20 1430  cefTRIAXone (ROCEPHIN) 2 g in sodium chloride 0.9 % 100 mL IVPB  Status:  Discontinued        2 g 200 mL/hr over 30 Minutes Intravenous  Once 02/04/20 1419 02/04/20 1519       Time spent: 25 minutes-Greater than 50% of this time was spent in counseling, explanation of diagnosis, planning of further management, and coordination of care.  MEDICATIONS: Scheduled Meds: . apixaban  5 mg Oral BID  . atorvastatin  10 mg Oral Daily  . brimonidine  1 drop Right Eye BID  . chlorhexidine  15 mL Mouth Rinse BID  . Chlorhexidine Gluconate Cloth  6 each Topical Daily  .  dorzolamide-timolol  1 drop Right Eye BID  . ferrous sulfate  325 mg Oral BID WC  . gabapentin  300 mg Oral Daily  . insulin aspart  0-15 Units Subcutaneous TID WC  . insulin aspart  0-5 Units Subcutaneous QHS  . latanoprost  1 drop Right Eye QHS  . mouth rinse  15 mL Mouth Rinse q12n4p  . methimazole  10 mg Oral BID  . pantoprazole  40 mg Oral BID  . sodium chloride flush  10-40 mL Intracatheter Q12H   Continuous Infusions: PRN Meds:.docusate sodium, polyethylene glycol, sodium chloride flush   PHYSICAL EXAM: Vital signs: Vitals:   02/07/20 1100 02/07/20 2056 02/08/20 0523 02/08/20 0752  BP: (!) 144/68 (!) 153/68 (!) 158/49 126/66  Pulse:  73 81 84  Resp: (!) 22 17 15 17   Temp: 98.5 F (36.9 C) 99.6 F (37.6 C) 99.4 F (37.4 C) (!) 100.7 F (38.2 C)  TempSrc: Oral Oral Oral Oral  SpO2:  95% 92% 95%  Weight:   89.8 kg    Filed Weights   02/06/20 0445 02/08/20 0523  Weight:  90.2 kg 89.8 kg   Body mass index is 35.07 kg/m.   Gen Exam:Alert awake-not in any distress HEENT:atraumatic, normocephalic Chest: B/L clear to auscultation anteriorly CVS:S1S2 regular Abdomen:soft non tender, non distended Extremities:no edema-right knee area continues to be with some tenderness-but without any overlying erythema/swelling. Neurology: Non focal Skin: no rash  I have personally reviewed following labs and imaging studies  LABORATORY DATA: CBC: Recent Labs  Lab 02/04/20 1325 02/04/20 1357 02/05/20 1359 02/06/20 0439 02/07/20 0321 02/07/20 1828 02/08/20 0207  WBC 16.1*   < > 11.8* 10.2 8.3 10.2 10.4  NEUTROABS 11.3*  --   --   --   --   --   --   HGB 9.7*   < > 8.2* 7.9* 7.4* 7.9* 8.1*  HCT 31.1*   < > 25.8* 24.4* 22.7* 24.6* 25.1*  MCV 80.4   < > 77.7* 79.0* 77.5* 78.1* 78.0*  PLT 368   < > 324 315 276 307 338   < > = values in this interval not displayed.    Basic Metabolic Panel: Recent Labs  Lab 02/04/20 1325 02/04/20 1357 02/05/20 0459 02/05/20 1359  02/06/20 0439 02/07/20 0321 02/08/20 0207  NA 134*   < > 140 137 136 136 133*  K 5.2*   < > 4.0 3.9 3.9 3.9 3.9  CL 100   < > 101 101 99 100 98  CO2 19*   < > 25 25 26 25 25   GLUCOSE 209*   < > 160* 179* 82 147* 161*  BUN 18   < > 21 20 18 14 12   CREATININE 1.22*   < > 1.21* 1.22* 1.11* 1.08* 0.93  CALCIUM 8.8*   < > 8.6* 8.1* 8.0* 8.2* 8.2*  MG 1.9  --  1.8  --  2.0  --   --   PHOS 4.0  --  4.4  --  3.4  --   --    < > = values in this interval not displayed.    GFR: Estimated Creatinine Clearance: 47.1 mL/min (by C-G formula based on SCr of 0.93 mg/dL).  Liver Function Tests: Recent Labs  Lab 02/04/20 1325 02/05/20 0459 02/06/20 0439  AST 20 28 23   ALT 11 16 15   ALKPHOS 93 129* 110  BILITOT 0.7 0.7 0.4  PROT 5.7* 4.7* 4.4*  ALBUMIN 1.8* 1.4* 1.4*   No results for input(s): LIPASE, AMYLASE in the last 168 hours. No results for input(s): AMMONIA in the last 168 hours.  Coagulation Profile: Recent Labs  Lab 02/04/20 1325 02/04/20 1620 02/05/20 0459  INR 1.2 2.0* 1.9*    Cardiac Enzymes: No results for input(s): CKTOTAL, CKMB, CKMBINDEX, TROPONINI in the last 168 hours.  BNP (last 3 results) No results for input(s): PROBNP in the last 8760 hours.  Lipid Profile: No results for input(s): CHOL, HDL, LDLCALC, TRIG, CHOLHDL, LDLDIRECT in the last 72 hours.  Thyroid Function Tests: No results for input(s): TSH, T4TOTAL, FREET4, T3FREE, THYROIDAB in the last 72 hours.  Anemia Panel: Recent Labs    02/07/20 0658  VITAMINB12 1,375*  FOLATE 14.9  FERRITIN 946*  TIBC 123*  IRON 18*  RETICCTPCT 3.0    Urine analysis:    Component Value Date/Time   COLORURINE YELLOW 01/02/2020 2251   APPEARANCEUR CLEAR 01/02/2020 2251   LABSPEC 1.011 01/02/2020 2251   PHURINE 5.0 01/02/2020 2251   GLUCOSEU 50 (A) 01/02/2020 2251   HGBUR SMALL (A) 01/02/2020 2251   BILIRUBINUR NEGATIVE 01/02/2020 2251  KETONESUR NEGATIVE 01/02/2020 2251   PROTEINUR NEGATIVE 01/02/2020  2251   NITRITE POSITIVE (A) 01/02/2020 2251   LEUKOCYTESUR NEGATIVE 01/02/2020 2251    Sepsis Labs: Lactic Acid, Venous    Component Value Date/Time   LATICACIDVEN 3.8 (HH) 02/04/2020 1620    MICROBIOLOGY: Recent Results (from the past 240 hour(s))  Blood culture (routine x 2)     Status: None (Preliminary result)   Collection Time: 02/04/20  1:44 PM   Specimen: BLOOD  Result Value Ref Range Status   Specimen Description BLOOD SITE NOT SPECIFIED  Final   Special Requests   Final    BOTTLES DRAWN AEROBIC AND ANAEROBIC Blood Culture results may not be optimal due to an inadequate volume of blood received in culture bottles   Culture   Final    NO GROWTH 3 DAYS Performed at [facility], [address] [place], [place] [address]    Report Status PENDING  Incomplete  Blood culture (routine x 2)     Status: None (Preliminary result)   Collection Time: 02/04/20  1:49 PM   Specimen: BLOOD  Result Value Ref Range Status   Specimen Description BLOOD SITE NOT SPECIFIED  Final   Special Requests   Final    BOTTLES DRAWN AEROBIC AND ANAEROBIC Blood Culture results may not be optimal due to an inadequate volume of blood received in culture bottles   Culture   Final    NO GROWTH 3 DAYS Performed at [facility], [address] [place], [place] [address]    Report Status PENDING  Incomplete  Resp Panel by RT-PCR (Flu A&B, Covid) Nasopharyngeal Swab     Status: None   Collection Time: 02/04/20  3:04 PM   Specimen: Nasopharyngeal Swab; Nasopharyngeal(NP) swabs in vial transport medium  Result Value Ref Range Status   SARS Coronavirus 2 by RT PCR NEGATIVE NEGATIVE Final    Comment: (NOTE) SARS-CoV-2 target nucleic acids are NOT DETECTED.  The SARS-CoV-2 RNA is generally detectable in upper respiratory specimens during the acute phase of infection. The lowest concentration of SARS-CoV-2 viral copies this assay can detect is 138 copies/mL. A negative result does not  preclude SARS-Cov-2 infection and should not be used as the sole basis for treatment or other patient management decisions. A negative result may occur with  improper specimen collection/handling, submission of specimen other than nasopharyngeal swab, presence of viral mutation(s) within the areas targeted by this assay, and inadequate number of viral copies(<138 copies/mL). A negative result must be combined with clinical observations, patient history, and epidemiological information. The expected result is Negative.  Fact Sheet for Patients:  BloggerCourse.comhttps://www.fda.gov/media/152166/download  Fact Sheet for Healthcare Providers:  SeriousBroker.ithttps://www.fda.gov/media/152162/download  This test is no t yet approved or cleared by the [place] FDA and  has been authorized for detection and/or diagnosis of SARS-CoV-2 by FDA under an Emergency Use Authorization (EUA). This EUA will remain  in effect (meaning this test can be used) for the duration of the COVID-19 declaration under Section 564(b)(1) of the Act, 21 U.S.C.section 360bbb-3(b)(1), unless the authorization is terminated  or revoked sooner.       Influenza A by PCR NEGATIVE NEGATIVE Final   Influenza B by PCR NEGATIVE NEGATIVE Final    Comment: (NOTE) The Xpert Xpress SARS-CoV-2/FLU/RSV plus assay is intended as an aid in the diagnosis of influenza from Nasopharyngeal swab specimens and should not be used as a sole basis for treatment. Nasal washings and aspirates are unacceptable for  Xpert Xpress SARS-CoV-2/FLU/RSV testing.  Fact Sheet for Patients: BloggerCourse.com  Fact Sheet for Healthcare Providers: SeriousBroker.it  This test is not yet approved or cleared by the [place] FDA and has been authorized for detection and/or diagnosis of SARS-CoV-2 by FDA under an Emergency Use Authorization (EUA). This EUA will remain in effect (meaning this test can be used) for the  duration of the COVID-19 declaration under Section 564(b)(1) of the Act, 21 U.S.C. section 360bbb-3(b)(1), unless the authorization is terminated or revoked.  Performed at [facility], [address] [place], [place] [address]   MRSA PCR Screening     Status: None   Collection Time: 02/04/20  5:44 PM   Specimen: Nasal Mucosa; Nasopharyngeal  Result Value Ref Range Status   MRSA by PCR NEGATIVE NEGATIVE Final    Comment:        The GeneXpert MRSA Assay (FDA approved for NASAL specimens only), is one component of a comprehensive MRSA colonization surveillance program. It is not intended to diagnose MRSA infection nor to guide or monitor treatment for MRSA infections. Performed at [facility], [address], [place], [place] [address]     RADIOLOGY STUDIES/RESULTS: DG Knee 1-2 Views Right  Result Date: 02/07/2020 CLINICAL DATA:  Pain EXAM: RIGHT KNEE - 1-2 VIEW COMPARISON:  December 28, 2015 FINDINGS: Frontal and lateral views were obtained. Bones are osteoporotic. There is no acute fracture or dislocation. There is a moderate knee joint effusion. There is marked joint space narrowing laterally, progressed from prior study. Moderate joint space narrowing medially and in the patellofemoral joint. Spurring in all compartments noted. Chondrocalcinosis noted. Calcification is noted in the medial collateral ligament on the left, stable. Incomplete visualization of sclerosis in the distal femoral diaphysis likely is due to prior bone infarct. Foci of arterial vascular calcification noted at multiple sites. IMPRESSION: 1.  Moderate joint effusion.  No fracture or dislocation. 2.  Osteoporosis.  Probable bone infarct distal femur. 3. Marked osteoarthritic change laterally with progression of narrowing compared to prior study. Moderate osteoarthritic change elsewhere. Chondrocalcinosis noted, a finding that may be seen with osteoarthritis or calcium pyrophosphate deposition disease.  4. Chronic calcification in the medial collateral ligament superiorly. 5.  Foci of atherosclerosis. Electronically Signed   By: Bretta Bang III M.D.   On: 02/07/2020 11:05     LOS: 4 days   Jeoffrey Massed, MD  [facility]    To contact the attending provider between 7A-7P or the covering provider during after hours 7P-7A, please log into the web site [email or web address] and access using universal [place] password for that web site. If you do not have the password, please call the hospital operator.  02/08/2020, 9:31 AM

## 2020-02-08 NOTE — Progress Notes (Signed)
Lower extremity venous has been completed.   Preliminary results in CV Proc.   Blanch Media 02/08/2020 2:53 PM

## 2020-02-08 NOTE — TOC Initial Note (Addendum)
Transition of Care [facility]) - Initial/Assessment Note    Patient Details  Name: Rebecca [name] MRN: [id number] Date of Birth: 01/23/1933  Transition of [facility]) CM/SW Contact:    Benard Halsted, LCSW Phone Number: 02/08/2020, 11:53 AM  Clinical Narrative:                 11:53am-CSW spoke with patient, her son (and his wife on phone) regarding SNF placement. CSW explained that patient will continue to be in Medicare copay days ($184/day) until a 60 day wellness period has occurred. They inquired on if patient had met her out of pocket deductible and CSW requested they contact her insurance to find that info. Will present bed offers when available and submit for insurance authorization.   1:30pm-CSW provided bed offers to patient's son. He discussed with patient and they have decided on [facility] as that is where patient's sisters have gone before. CSW awaiting confirmation from [facility].   Expected Discharge Plan: Skilled Nursing Facility Barriers to Discharge: Insurance Authorization, SNF Pending bed offer   Patient Goals and CMS Choice Patient states their goals for this hospitalization and ongoing recovery are:: Rehab CMS Medicare.gov Compare Post Acute Care list provided to:: Patient Represenative (must comment) Choice offered to / list presented to : Patient, Adult Children  Expected Discharge Plan and Services Expected Discharge Plan: [place] In-house Referral: Clinical Social Work   Post Acute Care Choice: [place] Living arrangements for the past 2 months: [place]                                      Prior Living Arrangements/Services Living arrangements for the past 2 months: [place] Lives with:: Self Patient language and need for interpreter reviewed:: Yes Do you feel safe going back to the place where you live?: Yes      Need for Family Participation in Patient Care:  Yes (Comment) Care giver support system in place?: Yes (comment)   Criminal Activity/Legal Involvement Pertinent to Current Situation/Hospitalization: No - Comment as needed  Activities of Daily Living Home Assistive Devices/Equipment: Environmental consultant (specify type), Eyeglasses, Dentures (specify type) ADL Screening (condition at time of admission) Patient's cognitive ability adequate to safely complete daily activities?: Yes Is the patient deaf or have difficulty hearing?: No Does the patient have difficulty seeing, even when wearing glasses/contacts?: No Does the patient have difficulty concentrating, remembering, or making decisions?: No Patient able to express need for assistance with ADLs?: Yes Does the patient have difficulty dressing or bathing?: No Independently performs ADLs?: No Does the patient have difficulty walking or climbing stairs?: No Weakness of Legs: None Weakness of Arms/Hands: None  Permission Sought/Granted Permission sought to share information with : Facility Sport and exercise psychologist, Family Supports Permission granted to share information with : Yes, Verbal Permission Granted  Share Information with NAME: Eduard Clos  Permission granted to share info w AGENCY: SNFs  Permission granted to share info w Relationship: Son  Permission granted to share info w Contact Information: [phone]  Emotional Assessment Appearance:: Appears stated age Attitude/Demeanor/Rapport: Gracious Affect (typically observed): Accepting, Appropriate Orientation: : Oriented to Self, Oriented to Place, Oriented to  Time, Oriented to Situation Alcohol / Substance Use: Not Applicable Psych Involvement: No (comment)  Admission diagnosis:  Shock ([place]) [R57.9] Shock circulatory ([place]) [R57.9] Patient Active Problem List   Diagnosis Date Noted  . Shock ([place]) 02/04/2020  .  Paroxysmal A-fib ([place]) 01/12/2020  . Thyroid storm 01/06/2020  . Spinal stenosis 01/06/2020  . Chronic kidney disease, stage  3a ([place]) 01/03/2020  . Microcytic anemia 01/03/2020  . Acute on chronic diastolic CHF (congestive heart failure) ([place]) 01/03/2020  . Physical deconditioning 01/03/2020  . Generalized osteoarthritis of hand 06/01/2019  . Pressure sore, right foot 06/01/2019  . Peripheral neuropathy 06/01/2019  . Degenerative arthritis of right knee 12/15/2018  . Knee osteoarthritis 09/08/2018  . Pes planus 12/01/2017  . Foot deformity, acquired, left 11/04/2017  . Degenerative disc disease, lumbar 08/01/2016  . Glaucoma 07/23/2016  . Numbness 04/23/2016  . Chronic pain of right knee 12/28/2015  . Lower back pain 10/24/2015  . Pain in both feet 10/24/2015  . Abnormality of gait 04/01/2013  . Posterior tibial tendon dysfunction 04/01/2013  . Diabetes ([place]) 04/01/2013  . Ankle pain, left 10/20/2010  . UNSPECIFIED VITAMIN D DEFICIENCY 11/08/2008  . EDEMA- LOCALIZED 11/08/2008  . Sickle-cell trait ([place]) 01/01/2008  . Esophageal reflux 09/02/2007  . GOITER, MULTINODULAR 11/26/2006  . Coronary artery disease involving native coronary artery of native heart without angina pectoris 11/26/2006  . HIATAL HERNIA 11/26/2006  . FIBROCYSTIC BREAST DISEASE 07/31/2006  . HYPERCHOLESTEROLEMIA 05/27/2006  . Essential hypertension 05/27/2006   PCP:  Binnie Rail, MD Pharmacy:   [facility] [address] [place]), [place] - [address]  ([place]) [place] [address] Phone: [phone] Fax: [phone]     Social Determinants of Health (SDOH) Interventions    Readmission Risk Interventions No flowsheet data found.

## 2020-02-09 LAB — CBC
HCT: 22.7 % — ABNORMAL LOW (ref 36.0–46.0)
Hemoglobin: 7.4 g/dL — ABNORMAL LOW (ref 12.0–15.0)
MCH: 25.2 pg — ABNORMAL LOW (ref 26.0–34.0)
MCHC: 32.6 g/dL (ref 30.0–36.0)
MCV: 77.2 fL — ABNORMAL LOW (ref 80.0–100.0)
Platelets: 333 10*3/uL (ref 150–400)
RBC: 2.94 MIL/uL — ABNORMAL LOW (ref 3.87–5.11)
RDW: 18 % — ABNORMAL HIGH (ref 11.5–15.5)
WBC: 11.5 10*3/uL — ABNORMAL HIGH (ref 4.0–10.5)
nRBC: 0 % (ref 0.0–0.2)

## 2020-02-09 LAB — GLUCOSE, CAPILLARY
Glucose-Capillary: 183 mg/dL — ABNORMAL HIGH (ref 70–99)
Glucose-Capillary: 153 mg/dL — ABNORMAL HIGH (ref 70–99)
Glucose-Capillary: 141 mg/dL — ABNORMAL HIGH (ref 70–99)
Glucose-Capillary: 168 mg/dL — ABNORMAL HIGH (ref 70–99)

## 2020-02-09 LAB — CULTURE, BLOOD (ROUTINE X 2)
Culture: NO GROWTH
Culture: NO GROWTH

## 2020-02-09 LAB — PREPARE RBC (CROSSMATCH)

## 2020-02-09 MED ORDER — DIPHENHYDRAMINE HCL 50 MG/ML IJ SOLN
25.0000 mg | Freq: Once | INTRAMUSCULAR | Status: AC
  Administered 2020-02-09: 25 mg via INTRAVENOUS
  Filled 2020-02-09: qty 1

## 2020-02-09 MED ORDER — SODIUM CHLORIDE 0.9% IV SOLUTION: Freq: Once | INTRAVENOUS | Status: AC

## 2020-02-09 MED ORDER — ACETAMINOPHEN 325 MG PO TABS
650.0000 mg | ORAL_TABLET | Freq: Once | ORAL | Status: AC
  Administered 2020-02-09: 650 mg via ORAL
  Filled 2020-02-09: qty 2

## 2020-02-09 MED ORDER — FUROSEMIDE 10 MG/ML IJ SOLN
20.0000 mg | Freq: Once | INTRAMUSCULAR | Status: AC
  Administered 2020-02-09: 20 mg via INTRAVENOUS
  Filled 2020-02-09: qty 2

## 2020-02-09 NOTE — Plan of Care (Signed)
  Problem: Clinical Measurements: Goal: Will remain free from infection Outcome: Progressing   Problem: Safety: Goal: Ability to remain free from injury will improve Outcome: Progressing   

## 2020-02-09 NOTE — TOC Progression Note (Addendum)
Transition of Care [facility]) - Progression Note    Patient Details  Name: Rebecca [name] MRN: [id number] Date of Birth: 04-18-1932  [facility]) CM/SW Contact  Mearl Latin, LCSW Phone Number: 02/09/2020, 1:22 PM  Clinical Narrative:    1:22pm-CSW alerted patient's son at bedside that the Peer to Peer with [facility] was denied by their MD. CSW made him aware that the patient's discharge options would either be home with home health services or private paying at a SNF (which must be paid 30 days up front, around $9000). Patient's son stated that patient does not have that kind of money and he works and is unable to stay with her. CSW stated acknowledged the difficulty and encouraged him to apply for Medicaid as it is the only thing that pays for long term care, but takes up to 90 days for a decision. He will speak with his family and let CSW know their decision.   2:49pm-CSW spoke with patient's daughter-in-law, Tania Ade [phone]). She stated that she had filed an appeal with [facility] Medicare and that they are not able to take patient home as it is not a safe discharge and they cannot afford private home care. She is contacting DSS to see if patient's community Medicaid can be switched to facility Medicaid.    Expected Discharge Plan: Skilled Nursing Facility Barriers to Discharge: English as a second language teacher, SNF Pending bed offer  Expected Discharge Plan and Services Expected Discharge Plan: Skilled Nursing Facility In-house Referral: Clinical Social Work   Post Acute Care Choice: Skilled Nursing Facility Living arrangements for the past 2 months: Skilled Nursing Facility                                       Social Determinants of Health (SDOH) Interventions    Readmission Risk Interventions No flowsheet data found.

## 2020-02-09 NOTE — Progress Notes (Signed)
PROGRESS NOTE        PATIENT DETAILS Name: Rebecca [name] Age: 86 y.o. Sex: female Date of Birth: [birth year] Admit Date: 02/04/2020 Admitting Physician Lorin Glass, MD IWP:[id number], [name], MD  Brief Narrative: Patient is a 85 y.o. female history of hypothyroidism, chronic diastolic heart failure, DM-2, CAD s/p remote PCI, PAF, spinal stenosis, CKD stage IIIa, HTN-presented to the hospital on 11/25 from a SNF after a syncopal episode-further evaluation revealed a massive pulmonary embolism-she was given TPA-and admitted to the ICU by PCCM. Upon further stability-she was transferred to the [facility]. See below for further details.  Significant events: 11/25>> admit to ICU-syncope/hypotensive/bradycardic-massive PE-given TPA. 11/28>> transfer to [facility]  Significant studies: 11/25>> CT head: No acute intracranial findings. 11/25>> CTA chest: Extensive pulmonary emboli, CT evidence of right heart strain, multinodular goiter 11/26>> Echo: EF 60-65%, right ventricular systolic function is normal, PA pressure of 46.6. 11/28>> x-ray right knee: Moderate joint effusion, marked OA changes, chronic calcification in the medial collateral ligament. 11/29>> bilateral lower extremity Doppler: No DVT  Antimicrobial therapy: None  Microbiology data: 11/25>> blood cultures: No growth  Procedures : None  Consults: PCCM  DVT Prophylaxis : apixaban (ELIQUIS) tablet 5 mg    Subjective: No major issues overnight-has not had a BM yesterday.  Mild right knee pain continues.  Assessment/Plan: Massive PE with obstructive shock: S/p TPA on admission-shock is resolved-BP now stable. Has been transitioned to Eliquis.  Await lower extremity Doppler ultrasound.  Microcytic anemia: Iron indices consistent with iron deficiency-patient not sure when her last colonoscopy was.  Reviewed flowsheet-apparently has had brown stools-no overt GI bleeding noted.   Concern for slow oozing from the GI tract-FOBT pending.  Difficult situation-has had life-threatening PE-needs uninterrupted anticoagulation ideally for a few months before proceeding with endoscopic evaluation.  Since not overtly bleeding-plan is to manage medically as possible with close CBC monitoring.  Her hemoglobin continues to drift down-she has underlying CAD as well-continue iron supplementation but will plan on 1 unit of PRBC transfusion (consent obtained from patient-son was at bedside).  If anemia significantly worsens or if patient develops overt GI bleeding-we will plan on GI consultation.  Continue to follow CBC.    Hyperthyroidism: On methimazole-follows with endocrinology in the outpatient setting. Defer further adjustments to endocrinology.  DM-2 (A1c 6.5 on 10/24): CBG stable with SSI   Recent Labs    02/08/20 1159 02/08/20 1626 02/09/20 0812  GLUCAP 178* 204* 183*   HTN: BP currently stable-all antihypertensives on hold-resume when able.  HLD: Continue statin  History of CAD: No anginal symptoms-we will stop aspirin given she is now on anticoagulation with Eliquis-and worsening anemia.  CKD stage IIIa: Creatinine close to baseline-follow periodically.  OA Right knee: Claims to have pain in her right knee-ongoing for the past several weeks--x-rays confirm marked OA changes-moderate joint effusion.  As noted above-patient does not want any further arthrocentesis-claims she has had 2 arthrocentesis in the past and really does not want to subject herself to arthrocentesis.  Continue supportive care-this seems to be a chronic issue for the patient.  Debility/deconditioning: Appreciate PT/OT eval-plans are for SNF on discharge.  Unfortunately-insurance declining SNF-I did a peer to peer with the insurance medical director-Dr. Johann Capers was informed by social work that patient still being denied.  I will defer further to social work at this point.  Obesity: Estimated body mass  index is 35.26 kg/m as calculated from the following:   Height as of this encounter:  (1.6 m).   Weight as of this encounter: 90.3 kg.    Diet: Diet Order            Diet Heart Room service appropriate? Yes; Fluid consistency: Thin  Diet effective now                  Code Status: Full code   Family Communication: Son [phone] a voicemail on 11/28-he was at bedside on 11/ 30  Disposition Plan: Status is: Inpatient  Remains inpatient appropriate because:Inpatient level of care appropriate due to severity of illness   Dispo: The patient is from: SNF              Anticipated d/c is to: SNF              Anticipated d/c date is: 2 days              Patient currently is not medically stable to d/c.   Barriers to Discharge: PRBC transfusion today-needs safe disposition-social work following.    Antimicrobial agents: Anti-infectives (From admission, onward)   Start     Dose/Rate Route Frequency Ordered Stop   02/05/20 1500  vancomycin (VANCOREADY) IVPB 1250 mg/250 mL  Status:  Discontinued        1,250 mg 166.7 mL/hr over 90 Minutes Intravenous Every 24 hours 02/04/20 1458 02/04/20 1502   02/04/20 1500  ceFEPIme (MAXIPIME) 2 g in sodium chloride 0.9 % 100 mL IVPB  Status:  Discontinued        2 g 200 mL/hr over 30 Minutes Intravenous Every 12 hours 02/04/20 1458 02/04/20 1502   02/04/20 1500  vancomycin (VANCOREADY) IVPB 1750 mg/350 mL  Status:  Discontinued        1,750 mg 175 mL/hr over 120 Minutes Intravenous  Once 02/04/20 1458 02/04/20 1502   02/04/20 1430  cefTRIAXone (ROCEPHIN) 2 g in sodium chloride 0.9 % 100 mL IVPB  Status:  Discontinued        2 g 200 mL/hr over 30 Minutes Intravenous  Once 02/04/20 1419 02/04/20 1519       Time spent: 25 minutes-Greater than 50% of this time was spent in counseling, explanation of diagnosis, planning of further management, and coordination of care.  MEDICATIONS: Scheduled Meds: . sodium chloride    Intravenous Once  . acetaminophen  650 mg Oral Once  . apixaban  5 mg Oral BID  . atorvastatin  10 mg Oral Daily  . brimonidine  1 drop Right Eye BID  . chlorhexidine  15 mL Mouth Rinse BID  . Chlorhexidine Gluconate Cloth  6 each Topical Daily  . diphenhydrAMINE  25 mg Intravenous Once  . dorzolamide-timolol  1 drop Right Eye BID  . ferrous sulfate  325 mg Oral BID WC  . furosemide  20 mg Intravenous Once  . gabapentin  300 mg Oral Daily  . insulin aspart  0-15 Units Subcutaneous TID WC  . insulin aspart  0-5 Units Subcutaneous QHS  . latanoprost  1 drop Right Eye QHS  . mouth rinse  15 mL Mouth Rinse q12n4p  . methimazole  10 mg Oral BID  . pantoprazole  40 mg Oral BID  . sodium chloride flush  10-40 mL Intracatheter Q12H   Continuous Infusions: PRN Meds:.docusate sodium, polyethylene glycol, sodium chloride flush   PHYSICAL EXAM: Vital signs: Vitals:  02/08/20 0752 02/08/20 1100 02/08/20 2057 02/09/20 0500  BP: 126/66  (!) 131/55 (!) 124/54  Pulse: 84  78 72  Resp: Temp: (!) 100.7 F (38.2 C)  99.6 F (37.6 C) 99.3 F (37.4 C)  TempSrc: Oral  Oral Oral  SpO2: 95%  98% 98%  Weight:  89.8 kg  90.3 kg  Height:   (1.6 m)     Filed Weights   02/08/20 0523 02/08/20 1100 02/09/20 0500  Weight: 89.8 kg 89.8 kg 90.3 kg   Body mass index is 35.26 kg/m.   Gen Exam:Alert awake-not in any distress HEENT:atraumatic, normocephalic Chest: B/L clear to auscultation anteriorly CVS:S1S2 regular Abdomen:soft non tender, non distended Extremities:no edema-right knee area continues to be with some tenderness-but without any overlying erythema/swelling. Neurology: Non focal Skin: no rash  I have personally reviewed following labs and imaging studies  LABORATORY DATA: CBC: Recent Labs  Lab 02/04/20 1325 02/04/20 1357 02/06/20 0439 02/07/20 0321 02/07/20 1828 02/08/20 0207 02/09/20 0330  WBC 16.1*   < > 10.2 8.3 10.2 10.4 11.5*  NEUTROABS 11.3*  --    --   --   --   --   --   HGB 9.7*   < > 7.9* 7.4* 7.9* 8.1* 7.4*  HCT 31.1*   < > 24.4* 22.7* 24.6* 25.1* 22.7*  MCV 80.4   < > 79.0* 77.5* 78.1* 78.0* 77.2*  PLT 368   < > 315 276 307 338 333   < > = values in this interval not displayed.    Basic Metabolic Panel: Recent Labs  Lab 02/04/20 1325 02/04/20 1357 02/05/20 0459 02/05/20 1359 02/06/20 0439 02/07/20 0321 02/08/20 0207  NA 134*   < > 140 137 136 136 133*  K 5.2*   < > 4.0 3.9 3.9 3.9 3.9  CL 100   < > 101 101 99 100 98  CO2 19*   < > GLUCOSE 209*   < > 160* 179* 82 147* 161*  BUN 18   < > CREATININE 1.22*   < > 1.21* 1.22* 1.11* 1.08* 0.93  CALCIUM 8.8*   < > 8.6* 8.1* 8.0* 8.2* 8.2*  MG 1.9  --  1.8  --  2.0  --   --   PHOS 4.0  --  4.4  --  3.4  --   --    < > = values in this interval not displayed.    GFR: Estimated Creatinine Clearance: 47.2 mL/min (by C-G formula based on SCr of 0.93 mg/dL).  Liver Function Tests: Recent Labs  Lab 02/04/20 1325 02/05/20 0459 02/06/20 0439  AST ALT ALKPHOS 93 129* 110  BILITOT 0.7 0.7 0.4  PROT 5.7* 4.7* 4.4*  ALBUMIN 1.8* 1.4* 1.4*   No results for input(s): LIPASE, AMYLASE in the last 168 hours. No results for input(s): AMMONIA in the last 168 hours.  Coagulation Profile: Recent Labs  Lab 02/04/20 1325 02/04/20 1620 02/05/20 0459  INR 1.2 2.0* 1.9*    Cardiac Enzymes: No results for input(s): CKTOTAL, CKMB, CKMBINDEX, TROPONINI in the last 168 hours.  BNP (last 3 results) No results for input(s): PROBNP in the last 8760 hours.  Lipid Profile: No results for input(s): CHOL, HDL, LDLCALC, TRIG, CHOLHDL, LDLDIRECT in the last 72 hours.  Thyroid Function Tests: No results for input(s): TSH, T4TOTAL, FREET4, T3FREE,  THYROIDAB in the last 72 hours.  Anemia Panel: Recent Labs    02/07/20 0658  VITAMINB12 1,375*  FOLATE 14.9  FERRITIN 946*  TIBC 123*  IRON 18*  RETICCTPCT 3.0    Urine  analysis:    Component Value Date/Time   COLORURINE YELLOW 01/02/2020 2251   APPEARANCEUR CLEAR 01/02/2020 2251   LABSPEC 1.011 01/02/2020 2251   PHURINE 5.0 01/02/2020 2251   GLUCOSEU 50 (A) 01/02/2020 2251   HGBUR SMALL (A) 01/02/2020 2251   BILIRUBINUR NEGATIVE 01/02/2020 2251   KETONESUR NEGATIVE 01/02/2020 2251   PROTEINUR NEGATIVE 01/02/2020 2251   NITRITE POSITIVE (A) 01/02/2020 2251   LEUKOCYTESUR NEGATIVE 01/02/2020 2251    Sepsis Labs: Lactic Acid, Venous    Component Value Date/Time   LATICACIDVEN 3.8 (HH) 02/04/2020 1620    MICROBIOLOGY: Recent Results (from the past 240 hour(s))  Blood culture (routine x 2)     Status: None   Collection Time: 02/04/20  1:44 PM   Specimen: BLOOD  Result Value Ref Range Status   Specimen Description BLOOD SITE NOT SPECIFIED  Final   Special Requests   Final    BOTTLES DRAWN AEROBIC AND ANAEROBIC Blood Culture results may not be optimal due to an inadequate volume of blood received in culture bottles   Culture   Final    NO GROWTH 5 DAYS Performed at [facility], [address] [place], [place] [address]    Report Status 02/09/2020 FINAL  Final  Blood culture (routine x 2)     Status: None   Collection Time: 02/04/20  1:49 PM   Specimen: BLOOD  Result Value Ref Range Status   Specimen Description BLOOD SITE NOT SPECIFIED  Final   Special Requests   Final    BOTTLES DRAWN AEROBIC AND ANAEROBIC Blood Culture results may not be optimal due to an inadequate volume of blood received in culture bottles   Culture   Final    NO GROWTH 5 DAYS Performed at [facility], [address] [place], [place] [address]    Report Status 02/09/2020 FINAL  Final  Resp Panel by RT-PCR (Flu A&B, Covid) Nasopharyngeal Swab     Status: None   Collection Time: 02/04/20  3:04 PM   Specimen: Nasopharyngeal Swab; Nasopharyngeal(NP) swabs in vial transport medium  Result Value Ref Range Status   SARS Coronavirus 2 by RT PCR NEGATIVE  NEGATIVE Final    Comment: (NOTE) SARS-CoV-2 target nucleic acids are NOT DETECTED.  The SARS-CoV-2 RNA is generally detectable in upper respiratory specimens during the acute phase of infection. The lowest concentration of SARS-CoV-2 viral copies this assay can detect is 138 copies/mL. A negative result does not preclude SARS-Cov-2 infection and should not be used as the sole basis for treatment or other patient management decisions. A negative result may occur with  improper specimen collection/handling, submission of specimen other than nasopharyngeal swab, presence of viral mutation(s) within the areas targeted by this assay, and inadequate number of viral copies(<138 copies/mL). A negative result must be combined with clinical observations, patient history, and epidemiological information. The expected result is Negative.  Fact Sheet for Patients:  BloggerCourse.comhttps://www.fda.gov/media/152166/download  Fact Sheet for Healthcare Providers:  SeriousBroker.ithttps://www.fda.gov/media/152162/download  This test is no t yet approved or cleared by the [place] FDA and  has been authorized for detection and/or diagnosis of SARS-CoV-2 by FDA under an Emergency Use Authorization (EUA). This EUA will remain  in effect (meaning this test can be used) for  the duration of the COVID-19 declaration under Section 564(b)(1) of the Act, 21 U.S.C.section 360bbb-3(b)(1), unless the authorization is terminated  or revoked sooner.       Influenza A by PCR NEGATIVE NEGATIVE Final   Influenza B by PCR NEGATIVE NEGATIVE Final    Comment: (NOTE) The Xpert Xpress SARS-CoV-2/FLU/RSV plus assay is intended as an aid in the diagnosis of influenza from Nasopharyngeal swab specimens and should not be used as a sole basis for treatment. Nasal washings and aspirates are unacceptable for Xpert Xpress SARS-CoV-2/FLU/RSV testing.  Fact Sheet for Patients: BloggerCourse.com  Fact Sheet for Healthcare  Providers: SeriousBroker.it  This test is not yet approved or cleared by the [place] FDA and has been authorized for detection and/or diagnosis of SARS-CoV-2 by FDA under an Emergency Use Authorization (EUA). This EUA will remain in effect (meaning this test can be used) for the duration of the COVID-19 declaration under Section 564(b)(1) of the Act, 21 U.S.C. section 360bbb-3(b)(1), unless the authorization is terminated or revoked.  Performed at [facility], [address] [place], [place] [address]   MRSA PCR Screening     Status: None   Collection Time: 02/04/20  5:44 PM   Specimen: Nasal Mucosa; Nasopharyngeal  Result Value Ref Range Status   MRSA by PCR NEGATIVE NEGATIVE Final    Comment:        The GeneXpert MRSA Assay (FDA approved for NASAL specimens only), is one component of a comprehensive MRSA colonization surveillance program. It is not intended to diagnose MRSA infection nor to guide or monitor treatment for MRSA infections. Performed at [facility], [address] [place], [place] [address]     RADIOLOGY STUDIES/RESULTS: VAS [place] LOWER EXTREMITY VENOUS (DVT)  Result Date: 02/08/2020  Lower Venous DVT Study Indications: Swelling, and Edema.  Comparison Study: 01/04/20 previous Performing Technologist: Blanch Media RVS  Examination Guidelines: A complete evaluation includes B-mode imaging, spectral Doppler, color Doppler, and power Doppler as needed of all accessible portions of each vessel. Bilateral testing is considered an integral part of a complete examination. Limited examinations for reoccurring indications may be performed as noted. The reflux portion of the exam is performed with the patient in reverse Trendelenburg.  +---------+---------------+---------+-----------+----------+-------------------+ RIGHT    CompressibilityPhasicitySpontaneityPropertiesThrombus Aging       +---------+---------------+---------+-----------+----------+-------------------+ CFV      Full           Yes      Yes                                      +---------+---------------+---------+-----------+----------+-------------------+ SFJ      Full                                                             +---------+---------------+---------+-----------+----------+-------------------+ FV Prox  Full                                                             +---------+---------------+---------+-----------+----------+-------------------+ FV Mid   Full                                                             +---------+---------------+---------+-----------+----------+-------------------+  FV DistalFull                                                             +---------+---------------+---------+-----------+----------+-------------------+ PFV      Full                                                             +---------+---------------+---------+-----------+----------+-------------------+ POP      Full           Yes      Yes                                      +---------+---------------+---------+-----------+----------+-------------------+ PTV      Full                                                             +---------+---------------+---------+-----------+----------+-------------------+ PERO                                                  Not well visualized +---------+---------------+---------+-----------+----------+-------------------+   +---------+---------------+---------+-----------+----------+-------------------+ LEFT     CompressibilityPhasicitySpontaneityPropertiesThrombus Aging      +---------+---------------+---------+-----------+----------+-------------------+ CFV      Full           Yes      Yes                                      +---------+---------------+---------+-----------+----------+-------------------+  SFJ      Full                                                             +---------+---------------+---------+-----------+----------+-------------------+ FV Prox  Full                                                             +---------+---------------+---------+-----------+----------+-------------------+ FV Mid   Full                                                             +---------+---------------+---------+-----------+----------+-------------------+ FV DistalFull                                                             +---------+---------------+---------+-----------+----------+-------------------+  PFV      Full                                                             +---------+---------------+---------+-----------+----------+-------------------+ POP      Full           Yes      Yes                                      +---------+---------------+---------+-----------+----------+-------------------+ PTV      Full                                                             +---------+---------------+---------+-----------+----------+-------------------+ PERO                                                  Not well visualized +---------+---------------+---------+-----------+----------+-------------------+     Summary: BILATERAL: - No evidence of deep vein thrombosis seen in the lower extremities, bilaterally. - No evidence of superficial venous thrombosis in the lower extremities, bilaterally. -No evidence of popliteal cyst, bilaterally.   *See table(s) above for measurements and observations. Electronically signed by Fabienne Bruns MD on 02/08/2020 at 3:54:48 PM.    Final      LOS: 5 days   Jeoffrey Massed, MD  [facility]    To contact the attending provider between 7A-7P or the covering provider during after hours 7P-7A, please log into the web site [email or web address] and access using universal [place] password for that web  site. If you do not have the password, please call the hospital operator.  02/09/2020, 11:56 AM

## 2020-02-09 NOTE — Care Management Important Message (Signed)
Important Message  Patient Details  Name: Rebecca [name] MRN: [id number] Date of Birth: [birth year]   Medicare Important Message Given:  Yes - Important Message mailed due to current National Emergency  Verbal consent obtained due to current National Emergency  Relationship to patient: Self Contact Name: Olimpia Tinch Call Date: 02/09/20  Time: 1335 Phone: [phone] Outcome: No Answer/Busy Important Message mailed to: Patient address on file    Orson Aloe 02/09/2020, 1:35 PM

## 2020-02-09 NOTE — Progress Notes (Signed)
Physical Therapy Treatment Patient Details Name: Rebecca [name] MRN: [id number] DOB: 06-12-1932 Today's Date: 02/09/2020    History of Present Illness 86 y/o F admitted 11/25 with syncopal episode, hypotension and bradycardia.  CTA positive for large pulmonary embolism, s/p tPA for hemodynamically significant PE. CT head was negative for acute process.  x-ray right knee: Moderate joint effusion, marked OA changes, chronic calcification in the medial collateral ligament. dopplers bil LEs negative for DVT  PMH-DM, HTN, HLD, CAD, anemia    PT Comments    Patient reporting decreased knee pain, yet remained anxious re: rt knee movement. Ultimately she tolerated several LE exercises with AAROM. She tolerated sitting EOB with +1 mod assist, HOB elevated and use of bed rail. She required +2 max assist to return to supine. Noted insurance denial for SNF and family unable to provide 24/7 care. If she returns home, recommend max HH services, however continue to feel SNF for rehab would be in her best interest.    Follow Up Recommendations  Supervision/Assistance - 24 hour;Home health PT (per SW note; insurance denied SNF although remains best option)     Geophysical data processor (measurements PT);Wheelchair cushion (measurements PT);Hospital bed;Other (comment);3in1 (PT) (hoyer lift, sliding board)    Recommendations for Other Services       Precautions / Restrictions Precautions Precautions: Fall Precaution Comments: painful Rknee with any mobility Required Braces or Orthoses: Other Brace Other Brace: bil prevalaon boots    Mobility  Bed Mobility Overal bed mobility: Needs Assistance Bed Mobility: Rolling;Supine to Sit;Sit to Supine Rolling: Max assist;+2 for physical assistance   Supine to sit: Mod assist;HOB elevated Sit to supine: Max assist;+2 for physical assistance   General bed mobility comments: pt assisted with moving legs towards left EOB, with HOB elevated  assisted to reach RUE across to left railing, PT used bed pad to help rotate hips and then elevate torso to sitting EOB; return to supine with +2 assist  Transfers                    Ambulation/Gait                 Stairs             Wheelchair Mobility    Modified Rankin (Stroke Patients Only)       Balance Overall balance assessment: Needs assistance Sitting-balance support: Bilateral upper extremity supported;Feet supported Sitting balance-Leahy Scale: Poor Sitting balance - Comments: needs UE support                                     Cognition Arousal/Alertness: Awake/alert Behavior During Therapy: WFL for tasks assessed/performed Overall Cognitive Status: No family/caregiver present to determine baseline cognitive functioning Area of Impairment: Memory;Problem solving                     Memory: Decreased short-term memory       Problem Solving: Slow processing;Requires verbal cues;Decreased initiation        Exercises General Exercises - Lower Extremity Ankle Circles/Pumps: AROM;10 reps Quad Sets: AROM;Both;5 reps;Supine Heel Slides: AAROM;Both;10 reps Hip ABduction/ADduction: AAROM;Both;10 reps    General Comments General comments (skin integrity, edema, etc.): +dizziness reported on initial sitting EOB--reported did not improve or worsen as EOB       Pertinent Vitals/Pain Pain Assessment: Faces Faces Pain Scale: Hurts little more Pain Location: R knee  Pain Descriptors / Indicators: Aching;Grimacing;Guarding;Sore Pain Intervention(s): Limited activity within patient's tolerance;Monitored during session    Home Living                      Prior Function            PT Goals (current goals can now be found in the care plan section) Acute Rehab PT Goals Patient Stated Goal: not return to [facility] Time For Goal Achievement: 02/21/20 Potential to Achieve Goals: Fair Progress towards PT  goals: Progressing toward goals    Frequency    Min 3X/week      PT Plan Discharge plan needs to be updated;Frequency needs to be updated    Co-evaluation              AM-PAC PT "6 Clicks" Mobility   Outcome Measure  Help needed turning from your back to your side while in a flat bed without using bedrails?: A Lot Help needed moving from lying on your back to sitting on the side of a flat bed without using bedrails?: A Lot Help needed moving to and from a bed to a chair (including a wheelchair)?: Total Help needed standing up from a chair using your arms (e.g., wheelchair or bedside chair)?: Total Help needed to walk in hospital room?: Total Help needed climbing 3-5 steps with a railing? : Total 6 Click Score: 8    End of Session Equipment Utilized During Treatment: Oxygen Activity Tolerance: Patient limited by fatigue Patient left: in bed;with call bell/phone within reach;with bed alarm set Nurse Communication: Mobility status;Need for lift equipment PT Visit Diagnosis: Pain;Muscle weakness (generalized) (M62.81);Difficulty in walking, not elsewhere classified (R26.2) Pain - Right/Left: Right Pain - part of body: Knee     Time: 1856-3149 PT Time Calculation (min) (ACUTE ONLY): 34 min  Charges:  $Therapeutic Exercise: 8-22 mins $Therapeutic Activity: 8-22 mins                      [name], PT Pager [phone]    [name] 02/09/2020, 4:09 PM

## 2020-02-10 LAB — CBC
HCT: 27.5 % — ABNORMAL LOW (ref 36.0–46.0)
Hemoglobin: 9.1 g/dL — ABNORMAL LOW (ref 12.0–15.0)
MCH: 26.1 pg (ref 26.0–34.0)
MCHC: 33.1 g/dL (ref 30.0–36.0)
MCV: 78.8 fL — ABNORMAL LOW (ref 80.0–100.0)
Platelets: 375 10*3/uL (ref 150–400)
RBC: 3.49 MIL/uL — ABNORMAL LOW (ref 3.87–5.11)
RDW: 18.9 % — ABNORMAL HIGH (ref 11.5–15.5)
WBC: 10 10*3/uL (ref 4.0–10.5)
nRBC: 0 % (ref 0.0–0.2)

## 2020-02-10 LAB — TYPE AND SCREEN
ABO/RH(D): A POS
Antibody Screen: NEGATIVE
Unit division: 0

## 2020-02-10 LAB — GLUCOSE, CAPILLARY
Glucose-Capillary: 187 mg/dL — ABNORMAL HIGH (ref 70–99)
Glucose-Capillary: 151 mg/dL — ABNORMAL HIGH (ref 70–99)
Glucose-Capillary: 185 mg/dL — ABNORMAL HIGH (ref 70–99)
Glucose-Capillary: 162 mg/dL — ABNORMAL HIGH (ref 70–99)
Glucose-Capillary: 195 mg/dL — ABNORMAL HIGH (ref 70–99)

## 2020-02-10 LAB — BPAM RBC
Blood Product Expiration Date: 202112302359
ISSUE DATE / TIME: 202111301605
Unit Type and Rh: 6200

## 2020-02-10 MED ORDER — POLYETHYLENE GLYCOL 3350 17 G PO PACK
17.0000 g | PACK | Freq: Every day | ORAL | Status: DC
  Filled 2020-02-10: qty 1

## 2020-02-10 MED ORDER — BISACODYL 10 MG RE SUPP
10.0000 mg | Freq: Every day | RECTAL | Status: DC | PRN
  Administered 2020-02-10: 10 mg via RECTAL
  Filled 2020-02-10: qty 1

## 2020-02-10 MED ORDER — ACETAMINOPHEN 325 MG PO TABS
650.0000 mg | ORAL_TABLET | Freq: Four times a day (QID) | ORAL | Status: DC | PRN
  Administered 2020-02-10 – 2020-02-11 (×3): 650 mg via ORAL
  Filled 2020-02-10 (×3): qty 2

## 2020-02-10 MED FILL — Medication: Qty: 1 | Status: AC

## 2020-02-10 NOTE — Plan of Care (Signed)
  Problem: Clinical Measurements: Goal: Will remain free from infection Outcome: Progressing   Problem: Elimination: Goal: Will not experience complications related to urinary retention Outcome: Progressing   Problem: Cardiac: Goal: Ability to achieve and maintain adequate cardiopulmonary perfusion will improve Outcome: Progressing   Problem: Fluid Volume: Goal: Ability to achieve a balanced intake and output will improve Outcome: Progressing   Problem: Physical Regulation: Goal: Complications related to the disease process, condition or treatment will be avoided or minimized Outcome: Progressing

## 2020-02-10 NOTE — Progress Notes (Addendum)
PROGRESS NOTE        PATIENT DETAILS Name: Rebecca [name] Age: 86 y.o. Sex: female Date of Birth: March 24, 1932 Admit Date: 02/04/2020 Admitting Physician Lorin Glassaniel C Smith, MD RUE:[id number]:Burns, Bobette MoStacy J, MD  Brief Narrative: Patient is a 86 y.o. female history of hypothyroidism, chronic diastolic heart failure, DM-2, CAD s/p remote PCI, PAF, spinal stenosis, CKD stage IIIa, HTN-presented to the hospital on 11/25 from a SNF after a syncopal episode-further evaluation revealed a massive pulmonary embolism-she was given TPA-and admitted to the ICU by PCCM. Upon further stability-she was transferred to the [facility]. See below for further details.  Significant events: 11/25>> admit to ICU-syncope/hypotensive/bradycardic-massive PE-given TPA. [facility]>> [facility]  Significant studies: 11/25>> CT head: No acute intracranial findings. 11/25>> CTA chest: Extensive pulmonary emboli, CT evidence of right heart strain, multinodular goiter 11/26>> Echo: EF 60-65%, right ventricular systolic function is normal, PA pressure of 46.6. 11/28>> x-ray right knee: Moderate joint effusion, marked OA changes, chronic calcification in the medial collateral ligament. 11/29>> bilateral lower extremity Doppler: No DVT  Antimicrobial therapy: None  Microbiology data: 11/25>> blood cultures: No growth  Procedures : None  Consults: PCCM  DVT Prophylaxis : apixaban (ELIQUIS) tablet 5 mg    Subjective: Only complaint is constipation-she has not had a BM in the past 2-3 days.  Daughter-in-law at bedside  Assessment/Plan: Massive PE with obstructive shock: S/p TPA on admission-shock is resolved-BP now stable. Has been transitioned to Eliquis.  Await lower extremity Doppler ultrasound.  Microcytic anemia: Iron indices consistent with iron deficiency-patient not sure when her last colonoscopy was.  Reviewed flowsheet-apparently has had brown stools-no overt GI  bleeding noted.  Concern for slow oozing from the GI tract-FOBT pending.  Difficult situation-has had life-threatening PE-needs uninterrupted anticoagulation ideally for a few months before proceeding with endoscopic evaluation.  Since not overtly bleeding-plan is to manage medically as possible with close CBC monitoring. If anemia significantly worsens or if patient develops overt GI bleeding-we will plan on GI consultation.  Continue to follow CBC.    Hyperthyroidism: On methimazole-follows with endocrinology in the outpatient setting. Defer further adjustments to endocrinology.  DM-2 (A1c 6.5 on 10/24): CBG stable with SSI   Recent Labs    02/09/20 2108 02/10/20 0741 02/10/20 1150  GLUCAP 168* 151* 185*   HTN: BP currently stable-all antihypertensives on hold-resume when able.  HLD: Continue statin  History of CAD: No anginal symptoms-we will stop aspirin given she is now on anticoagulation with Eliquis-and worsening anemia.  CKD stage IIIa: Creatinine close to baseline-follow periodically.  OA Right knee: Claims to have pain in her right knee-ongoing for the past several weeks--x-rays confirm marked OA changes-moderate joint effusion.  As noted above-patient does not want any further arthrocentesis-claims she has had 2 arthrocentesis in the past and really does not want to subject herself to arthrocentesis.  Continue supportive care-this seems to be a chronic issue for the patient.  Constipation: Try MiraLAX-if no BM-we will try Dulcolax suppository.  Debility/deconditioning: Appreciate PT/OT eval-plans are for SNF on discharge.  Unfortunately-insurance declining SNF-I did a peer to peer with the insurance medical director-Dr. Johann CapersSmith-I was informed by social work that patient still being denied.  I will defer further to social work at this point.   Obesity: Estimated body mass index is 36.08 kg/m as calculated from the following:   Height  as of this encounter: 5\' 3"  (1.6 m).    Weight as of this encounter: 92.4 kg.    Diet: Diet Order            Diet Heart Room service appropriate? Yes; Fluid consistency: Thin  Diet effective now                  Code Status: Full code   Family Communication: Son [phone] bedside on 11/30.  Daughter-in-law at bedside on 12/1  Disposition Plan: Status is: Inpatient  Remains inpatient appropriate because:Inpatient level of care appropriate due to severity of illness   Dispo: The patient is from: SNF              Anticipated d/c is to: SNF              Anticipated d/c date is: 1 days              Patient currently is  medically stable to d/c.   Barriers to Discharge: Unable to ambulate-ideally needs SNF-however insurance has declined to pay for SNF.  Family cannot provide 24/7 care.  Unsafe disposition plan at this point.  Family has appealed 14/1 regarding denial of SNF.  Antimicrobial agents: Anti-infectives (From admission, onward)   Start     Dose/Rate Route Frequency Ordered Stop   02/05/20 1500  vancomycin (VANCOREADY) IVPB 1250 mg/250 mL  Status:  Discontinued        1,250 mg 166.7 mL/hr over 90 Minutes Intravenous Every 24 hours 02/04/20 1458 02/04/20 1502   02/04/20 1500  ceFEPIme (MAXIPIME) 2 g in sodium chloride 0.9 % 100 mL IVPB  Status:  Discontinued        2 g 200 mL/hr over 30 Minutes Intravenous Every 12 hours 02/04/20 1458 02/04/20 1502   02/04/20 1500  vancomycin (VANCOREADY) IVPB 1750 mg/350 mL  Status:  Discontinued        1,750 mg 175 mL/hr over 120 Minutes Intravenous  Once 02/04/20 1458 02/04/20 1502   02/04/20 1430  cefTRIAXone (ROCEPHIN) 2 g in sodium chloride 0.9 % 100 mL IVPB  Status:  Discontinued        2 g 200 mL/hr over 30 Minutes Intravenous  Once 02/04/20 1419 02/04/20 1519       Time spent: 25 minutes-Greater than 50% of this time was spent in counseling, explanation of diagnosis, planning of further management, and coordination of  care.  MEDICATIONS: Scheduled Meds: . apixaban  5 mg Oral BID  . atorvastatin  10 mg Oral Daily  . brimonidine  1 drop Right Eye BID  . chlorhexidine  15 mL Mouth Rinse BID  . dorzolamide-timolol  1 drop Right Eye BID  . ferrous sulfate  325 mg Oral BID WC  . gabapentin  300 mg Oral Daily  . insulin aspart  0-15 Units Subcutaneous TID WC  . insulin aspart  0-5 Units Subcutaneous QHS  . latanoprost  1 drop Right Eye QHS  . mouth rinse  15 mL Mouth Rinse q12n4p  . methimazole  10 mg Oral BID  . pantoprazole  40 mg Oral BID  . sodium chloride flush  10-40 mL Intracatheter Q12H   Continuous Infusions: PRN Meds:.acetaminophen, docusate sodium, polyethylene glycol, sodium chloride flush   PHYSICAL EXAM: Vital signs: Vitals:   02/09/20 1740 02/09/20 2012 02/10/20 0427 02/10/20 0743  BP: (!) 128/51 (!) 113/53 (!) 112/53 (!) 122/54  Pulse: 67 79 68 64  Resp: 18 10 15 14   Temp:  99.5 F (37.5 C) 99 F (37.2 C) 98.1 F (36.7 C) 98.2 F (36.8 C)  TempSrc: Oral Oral Axillary Oral  SpO2: 99% 97%  100%  Weight:   92.4 kg   Height:       Filed Weights   02/08/20 1100 02/09/20 0500 02/10/20 0427  Weight: 89.8 kg 90.3 kg 92.4 kg   Body mass index is 36.08 kg/m.  Gen Exam:Alert awake-not in any distress HEENT:atraumatic, normocephalic Chest: B/L clear to auscultation anteriorly CVS:S1S2 regular Abdomen:soft non tender, non distended Extremities:no edema Neurology: Non focal Skin: no rash  I have personally reviewed following labs and imaging studies  LABORATORY DATA: CBC: Recent Labs  Lab 02/04/20 1325 02/04/20 1357 02/07/20 0321 02/07/20 1828 02/08/20 0207 02/09/20 0330 02/10/20 0042  WBC 16.1*   < > 8.3 10.2 10.4 11.5* 10.0  NEUTROABS 11.3*  --   --   --   --   --   --   HGB 9.7*   < > 7.4* 7.9* 8.1* 7.4* 9.1*  HCT 31.1*   < > 22.7* 24.6* 25.1* 22.7* 27.5*  MCV 80.4   < > 77.5* 78.1* 78.0* 77.2* 78.8*  PLT 368   < > 276 307 338 333 375   < > = values in this  interval not displayed.    Basic Metabolic Panel: Recent Labs  Lab 02/04/20 1325 02/04/20 1357 02/05/20 0459 02/05/20 1359 02/06/20 0439 02/07/20 0321 02/08/20 0207  NA 134*   < > 140 137 136 136 133*  K 5.2*   < > 4.0 3.9 3.9 3.9 3.9  CL 100   < > 101 101 99 100 98  CO2 19*   < > 25 25 26 25 25   GLUCOSE 209*   < > 160* 179* 82 147* 161*  BUN 18   < > 21 20 18 14 12   CREATININE 1.22*   < > 1.21* 1.22* 1.11* 1.08* 0.93  CALCIUM 8.8*   < > 8.6* 8.1* 8.0* 8.2* 8.2*  MG 1.9  --  1.8  --  2.0  --   --   PHOS 4.0  --  4.4  --  3.4  --   --    < > = values in this interval not displayed.    GFR: Estimated Creatinine Clearance: 47.8 mL/min (by C-G formula based on SCr of 0.93 mg/dL).  Liver Function Tests: Recent Labs  Lab 02/04/20 1325 02/05/20 0459 02/06/20 0439  AST 20 28 23   ALT 11 16 15   ALKPHOS 93 129* 110  BILITOT 0.7 0.7 0.4  PROT 5.7* 4.7* 4.4*  ALBUMIN 1.8* 1.4* 1.4*   No results for input(s): LIPASE, AMYLASE in the last 168 hours. No results for input(s): AMMONIA in the last 168 hours.  Coagulation Profile: Recent Labs  Lab 02/04/20 1325 02/04/20 1620 02/05/20 0459  INR 1.2 2.0* 1.9*    Cardiac Enzymes: No results for input(s): CKTOTAL, CKMB, CKMBINDEX, TROPONINI in the last 168 hours.  BNP (last 3 results) No results for input(s): PROBNP in the last 8760 hours.  Lipid Profile: No results for input(s): CHOL, HDL, LDLCALC, TRIG, CHOLHDL, LDLDIRECT in the last 72 hours.  Thyroid Function Tests: No results for input(s): TSH, T4TOTAL, FREET4, T3FREE, THYROIDAB in the last 72 hours.  Anemia Panel: No results for input(s): VITAMINB12, FOLATE, FERRITIN, TIBC, IRON, RETICCTPCT in the last 72 hours.  Urine analysis:    Component Value Date/Time   COLORURINE YELLOW 01/02/2020 2251   APPEARANCEUR CLEAR 01/02/2020 2251  LABSPEC 1.011 01/02/2020 2251   PHURINE 5.0 01/02/2020 2251   GLUCOSEU 50 (A) 01/02/2020 2251   HGBUR SMALL (A) 01/02/2020 2251    BILIRUBINUR NEGATIVE 01/02/2020 2251   KETONESUR NEGATIVE 01/02/2020 2251   PROTEINUR NEGATIVE 01/02/2020 2251   NITRITE POSITIVE (A) 01/02/2020 2251   LEUKOCYTESUR NEGATIVE 01/02/2020 2251    Sepsis Labs: Lactic Acid, Venous    Component Value Date/Time   LATICACIDVEN 3.8 (HH) 02/04/2020 1620    MICROBIOLOGY: Recent Results (from the past 240 hour(s))  Blood culture (routine x 2)     Status: None   Collection Time: 02/04/20  1:44 PM   Specimen: BLOOD  Result Value Ref Range Status   Specimen Description BLOOD SITE NOT SPECIFIED  Final   Special Requests   Final    BOTTLES DRAWN AEROBIC AND ANAEROBIC Blood Culture results may not be optimal due to an inadequate volume of blood received in culture bottles   Culture   Final    NO GROWTH 5 DAYS Performed at [facility], [address], [place], [place] [address]    Report Status 02/09/2020 FINAL  Final  Blood culture (routine x 2)     Status: None   Collection Time: 02/04/20  1:49 PM   Specimen: BLOOD  Result Value Ref Range Status   Specimen Description BLOOD SITE NOT SPECIFIED  Final   Special Requests   Final    BOTTLES DRAWN AEROBIC AND ANAEROBIC Blood Culture results may not be optimal due to an inadequate volume of blood received in culture bottles   Culture   Final    NO GROWTH 5 DAYS Performed at [facility], [address], [place], [place] [address]    Report Status 02/09/2020 FINAL  Final  Resp Panel by RT-PCR (Flu A&B, Covid) Nasopharyngeal Swab     Status: None   Collection Time: 02/04/20  3:04 PM   Specimen: Nasopharyngeal Swab; Nasopharyngeal(NP) swabs in vial transport medium  Result Value Ref Range Status   SARS Coronavirus 2 by RT PCR NEGATIVE NEGATIVE Final    Comment: (NOTE) SARS-CoV-2 target nucleic acids are NOT DETECTED.  The SARS-CoV-2 RNA is generally detectable in upper respiratory specimens during the acute phase of infection. The lowest concentration of SARS-CoV-2 viral copies  this assay can detect is 138 copies/mL. A negative result does not preclude SARS-Cov-2 infection and should not be used as the sole basis for treatment or other patient management decisions. A negative result may occur with  improper specimen collection/handling, submission of specimen other than nasopharyngeal swab, presence of viral mutation(s) within the areas targeted by this assay, and inadequate number of viral copies(<138 copies/mL). A negative result must be combined with clinical observations, patient history, and epidemiological information. The expected result is Negative.  Fact Sheet for Patients:  BloggerCourse.com  Fact Sheet for Healthcare Providers:  SeriousBroker.it  This test is no t yet approved or cleared by the [place] FDA and  has been authorized for detection and/or diagnosis of SARS-CoV-2 by FDA under an Emergency Use Authorization (EUA). This EUA will remain  in effect (meaning this test can be used) for the duration of the COVID-19 declaration under Section 564(b)(1) of the Act, 21 U.S.C.section 360bbb-3(b)(1), unless the authorization is terminated  or revoked sooner.       Influenza A by PCR NEGATIVE NEGATIVE Final   Influenza B by PCR NEGATIVE NEGATIVE Final    Comment: (NOTE) The Xpert Xpress SARS-CoV-2/FLU/RSV plus assay is intended  as an aid in the diagnosis of influenza from Nasopharyngeal swab specimens and should not be used as a sole basis for treatment. Nasal washings and aspirates are unacceptable for Xpert Xpress SARS-CoV-2/FLU/RSV testing.  Fact Sheet for Patients: BloggerCourse.com  Fact Sheet for Healthcare Providers: SeriousBroker.it  This test is not yet approved or cleared by the [place] FDA and has been authorized for detection and/or diagnosis of SARS-CoV-2 by FDA under an Emergency Use Authorization (EUA). This EUA  will remain in effect (meaning this test can be used) for the duration of the COVID-19 declaration under Section 564(b)(1) of the Act, 21 U.S.C. section 360bbb-3(b)(1), unless the authorization is terminated or revoked.  Performed at [facility], [address], [place], [place] [address]   MRSA PCR Screening     Status: None   Collection Time: 02/04/20  5:44 PM   Specimen: Nasal Mucosa; Nasopharyngeal  Result Value Ref Range Status   MRSA by PCR NEGATIVE NEGATIVE Final    Comment:        The GeneXpert MRSA Assay (FDA approved for NASAL specimens only), is one component of a comprehensive MRSA colonization surveillance program. It is not intended to diagnose MRSA infection nor to guide or monitor treatment for MRSA infections. Performed at [facility], [address], [place], [place] [address]     RADIOLOGY STUDIES/RESULTS: No results found.   LOS: 6 days   Jeoffrey Massed, MD  [facility]    To contact the attending provider between 7A-7P or the covering provider during after hours 7P-7A, please log into the web site [email or web address] and access using universal [place] password for that web site. If you do not have the password, please call the hospital operator.  02/10/2020, 2:31 PM

## 2020-02-10 NOTE — Progress Notes (Signed)
Physical Therapy Treatment Patient Details Name: Rebecca [name] MRN: [id number] DOB: 1932-05-05 Today's Date: 02/10/2020    History of Present Illness 86 y/o F admitted 11/25 with syncopal episode, hypotension and bradycardia.  CTA positive for large pulmonary embolism, s/p tPA for hemodynamically significant PE. CT head was negative for acute process.  x-ray right knee: Moderate joint effusion, marked OA changes, chronic calcification in the medial collateral ligament. dopplers bil LEs negative for DVT  PMH-DM, HTN, HLD, CAD, anemia    PT Comments    Patient with less pain in rt knee and tolerated incr ROM/exercises. Unfortunately with more back pain today. Able to attempt standing with use of stedy, however did not clear buttocks off bed even from elevated surface. Return to supine was most painful part of session for her (she was afraid to pass through sidelying on her way to supine and instead pushed/turned straight to her back as legs assisted up onto bed). Anticipate slow progress due to patient's pain levels and fear of falling. Anticipate she will require wheelchair for locomotion.    Follow Up Recommendations  Supervision/Assistance - 24 hour;Home health PT (per SW note; insurance denied SNF (remains best option))     Equipment Recommendations  Wheelchair (measurements PT);Wheelchair cushion (measurements PT);Hospital bed;Other (comment);3in1 (PT) (hoyer lift, sliding board)    Recommendations for Other Services       Precautions / Restrictions Precautions Precautions: Fall Precaution Comments: painful R knee and back Required Braces or Orthoses: Other Brace Other Brace: bil prevalaon boots (on admission)    Mobility  Bed Mobility Overal bed mobility: Needs Assistance Bed Mobility: Rolling;Supine to Sit;Sit to Supine     Supine to sit: Mod assist;HOB elevated (with rail) Sit to supine: Max assist;+2 for physical assistance   General bed mobility comments: pt  assisted with moving legs towards right EOB, hooked her RLE over EOB to help scoot her hips, with HOB elevated assisted to reach LUE across to left railing, PT used bed pad to help rotate hips and then elevate torso to sitting EOB; return to supine with +2 assist (attempted sit to side to decr back pain, however pt immediately rotated towards supine with incr pain  Transfers Overall transfer level: Needs assistance   Transfers: Sit to/from Stand;Lateral/Scoot Transfers Sit to Stand: From elevated surface (attempted to stand x2 without clearing hips off bed)        Lateral/Scoot Transfers:  (attempted to Rt at EOB and unable to advance hips)    Ambulation/Gait                 Stairs             Wheelchair Mobility    Modified Rankin (Stroke Patients Only)       Balance Overall balance assessment: Needs assistance Sitting-balance support: Bilateral upper extremity supported;Feet supported Sitting balance-Leahy Scale: Poor Sitting balance - Comments: needs UE support                                     Cognition Arousal/Alertness: Awake/alert Behavior During Therapy: WFL for tasks assessed/performed Overall Cognitive Status: Impaired/Different from baseline Area of Impairment: Memory                 Orientation Level:  (NT)   Memory: Decreased short-term memory   Safety/Judgement: Decreased awareness of deficits   Problem Solving: Slow processing;Requires verbal cues;Decreased initiation General Comments: told daughter  dressings on feet not changed since she came to hospital (dated today and RN confirmed changed this morning)      Exercises General Exercises - Lower Extremity Ankle Circles/Pumps: AROM;10 reps Quad Sets: AROM;Both;5 reps;Supine Heel Slides: AROM;AAROM;Both;5 reps    General Comments General comments (skin integrity, edema, etc.): Daughter-in-law present throughout treatment. She noted pt's severe back pain has  really limited her recovery (again emphasized that pt was ambulatory with [facility]W up until 10/23 hospitalization      Pertinent Vitals/Pain Pain Assessment: Faces Faces Pain Scale: Hurts whole lot Pain Location: low back Pain Descriptors / Indicators: Grimacing;Guarding;Moaning;Spasm Pain Intervention(s): Limited activity within patient's tolerance;Monitored during session;Premedicated before session;Repositioned    Home Living                      Prior Function            PT Goals (current goals can now be found in the care plan section) Acute Rehab PT Goals Patient Stated Goal: not return to [facility] Time For Goal Achievement: 02/21/20 Potential to Achieve Goals: Fair Progress towards PT goals: Progressing toward goals    Frequency    Min 3X/week      PT Plan Discharge plan needs to be updated;Frequency needs to be updated    Co-evaluation              AM-PAC PT "6 Clicks" Mobility   Outcome Measure  Help needed turning from your back to your side while in a flat bed without using bedrails?: A Lot Help needed moving from lying on your back to sitting on the side of a flat bed without using bedrails?: A Lot Help needed moving to and from a bed to a chair (including a wheelchair)?: Total Help needed standing up from a chair using your arms (e.g., wheelchair or bedside chair)?: Total Help needed to walk in hospital room?: Total Help needed climbing 3-5 steps with a railing? : Total 6 Click Score: 8    End of Session Equipment Utilized During Treatment: Oxygen Activity Tolerance: Patient limited by fatigue;Patient limited by pain Patient left: in bed;with call bell/phone within reach;with bed alarm set;with family/visitor present Nurse Communication: Mobility status;Need for lift equipment PT Visit Diagnosis: Pain;Muscle weakness (generalized) (M62.81);Difficulty in walking, not elsewhere classified (R26.2) Pain - Right/Left: Right Pain - part of body:  Knee     Time: 1123-1221 PT Time Calculation (min) (ACUTE ONLY): 58 min  Charges:  $Therapeutic Exercise: 8-22 mins $Therapeutic Activity: 38-52 mins                      [name], PT Pager [phone]    [name] 02/10/2020, 2:06 PM

## 2020-02-10 NOTE — Plan of Care (Signed)
  Problem: Elimination: Goal: Will not experience complications related to urinary retention Outcome: Progressing   Problem: Clinical Measurements: Goal: Will remain free from infection Outcome: Progressing   Problem: Safety: Goal: Ability to remain free from injury will improve Outcome: Progressing

## 2020-02-11 DIAGNOSIS — D509 Iron deficiency anemia, unspecified: Secondary | ICD-10-CM | POA: Diagnosis not present

## 2020-02-11 DIAGNOSIS — Z743 Need for continuous supervision: Secondary | ICD-10-CM | POA: Diagnosis not present

## 2020-02-11 DIAGNOSIS — M1711 Unilateral primary osteoarthritis, right knee: Secondary | ICD-10-CM | POA: Diagnosis not present

## 2020-02-11 DIAGNOSIS — M4800 Spinal stenosis, site unspecified: Secondary | ICD-10-CM | POA: Diagnosis not present

## 2020-02-11 DIAGNOSIS — M6281 Muscle weakness (generalized): Secondary | ICD-10-CM | POA: Diagnosis not present

## 2020-02-11 DIAGNOSIS — L89610 Pressure ulcer of right heel, unstageable: Secondary | ICD-10-CM | POA: Diagnosis not present

## 2020-02-11 DIAGNOSIS — I2510 Atherosclerotic heart disease of native coronary artery without angina pectoris: Secondary | ICD-10-CM | POA: Diagnosis not present

## 2020-02-11 DIAGNOSIS — I10 Essential (primary) hypertension: Secondary | ICD-10-CM | POA: Diagnosis not present

## 2020-02-11 DIAGNOSIS — M2550 Pain in unspecified joint: Secondary | ICD-10-CM | POA: Diagnosis not present

## 2020-02-11 DIAGNOSIS — G629 Polyneuropathy, unspecified: Secondary | ICD-10-CM | POA: Diagnosis not present

## 2020-02-11 DIAGNOSIS — E7800 Pure hypercholesterolemia, unspecified: Secondary | ICD-10-CM | POA: Diagnosis not present

## 2020-02-11 DIAGNOSIS — R262 Difficulty in walking, not elsewhere classified: Secondary | ICD-10-CM | POA: Diagnosis not present

## 2020-02-11 DIAGNOSIS — H409 Unspecified glaucoma: Secondary | ICD-10-CM | POA: Diagnosis not present

## 2020-02-11 DIAGNOSIS — L89620 Pressure ulcer of left heel, unstageable: Secondary | ICD-10-CM | POA: Diagnosis not present

## 2020-02-11 DIAGNOSIS — R6889 Other general symptoms and signs: Secondary | ICD-10-CM | POA: Diagnosis not present

## 2020-02-11 DIAGNOSIS — I5033 Acute on chronic diastolic (congestive) heart failure: Secondary | ICD-10-CM | POA: Diagnosis not present

## 2020-02-11 DIAGNOSIS — M5459 Other low back pain: Secondary | ICD-10-CM | POA: Diagnosis not present

## 2020-02-11 DIAGNOSIS — I2699 Other pulmonary embolism without acute cor pulmonale: Secondary | ICD-10-CM | POA: Diagnosis not present

## 2020-02-11 DIAGNOSIS — K219 Gastro-esophageal reflux disease without esophagitis: Secondary | ICD-10-CM | POA: Diagnosis not present

## 2020-02-11 DIAGNOSIS — N1831 Chronic kidney disease, stage 3a: Secondary | ICD-10-CM | POA: Diagnosis not present

## 2020-02-11 DIAGNOSIS — I2609 Other pulmonary embolism with acute cor pulmonale: Secondary | ICD-10-CM | POA: Diagnosis not present

## 2020-02-11 DIAGNOSIS — E119 Type 2 diabetes mellitus without complications: Secondary | ICD-10-CM | POA: Diagnosis not present

## 2020-02-11 DIAGNOSIS — Z7401 Bed confinement status: Secondary | ICD-10-CM | POA: Diagnosis not present

## 2020-02-11 DIAGNOSIS — R579 Shock, unspecified: Secondary | ICD-10-CM | POA: Diagnosis not present

## 2020-02-11 DIAGNOSIS — E0591 Thyrotoxicosis, unspecified with thyrotoxic crisis or storm: Secondary | ICD-10-CM | POA: Diagnosis not present

## 2020-02-11 DIAGNOSIS — I480 Paroxysmal atrial fibrillation: Secondary | ICD-10-CM | POA: Diagnosis not present

## 2020-02-11 DIAGNOSIS — R5381 Other malaise: Secondary | ICD-10-CM | POA: Diagnosis not present

## 2020-02-11 DIAGNOSIS — M25569 Pain in unspecified knee: Secondary | ICD-10-CM | POA: Diagnosis not present

## 2020-02-11 LAB — GLUCOSE, CAPILLARY
Glucose-Capillary: 172 mg/dL — ABNORMAL HIGH (ref 70–99)
Glucose-Capillary: 167 mg/dL — ABNORMAL HIGH (ref 70–99)
Glucose-Capillary: 163 mg/dL — ABNORMAL HIGH (ref 70–99)

## 2020-02-11 LAB — SARS CORONAVIRUS 2 BY RT PCR (HOSPITAL ORDER, PERFORMED IN ~~LOC~~ HOSPITAL LAB): SARS Coronavirus 2: NEGATIVE

## 2020-02-11 MED ORDER — INSULIN ASPART 100 UNIT/ML ~~LOC~~ SOLN
SUBCUTANEOUS | 11 refills | Status: DC
Start: 2020-02-11 — End: 2020-11-16

## 2020-02-11 MED ORDER — FERROUS SULFATE 325 (65 FE) MG PO TABS
325.0000 mg | ORAL_TABLET | Freq: Two times a day (BID) | ORAL | 3 refills | Status: DC
Start: 2020-02-11 — End: 2021-02-10

## 2020-02-11 MED ORDER — PANTOPRAZOLE SODIUM 40 MG PO TBEC
40.0000 mg | DELAYED_RELEASE_TABLET | Freq: Two times a day (BID) | ORAL | Status: DC
Start: 2020-02-11 — End: 2020-12-06

## 2020-02-11 MED ORDER — POLYETHYLENE GLYCOL 3350 17 G PO PACK
17.0000 g | PACK | Freq: Every day | ORAL | 0 refills | Status: DC
Start: 2020-02-12 — End: 2020-11-16

## 2020-02-11 MED ORDER — APIXABAN 5 MG PO TABS
5.0000 mg | ORAL_TABLET | Freq: Two times a day (BID) | ORAL | Status: DC
Start: 2020-02-11 — End: 2020-11-15

## 2020-02-11 NOTE — Discharge Summary (Signed)
PATIENT DETAILS Name: Rebecca [name] Age: 86 y.o. Sex: female Date of Birth: November 29, 1932 MRN: [id number]. Admitting Physician: Lorin Glass, MD WUJ:[id number], [name], MD  Admit Date: 02/04/2020 Discharge date: 02/11/2020  Recommendations for Outpatient Follow-up:  1. Follow up with PCP in 1-2 weeks 2. Please obtain CMP/CBC in one week 3. Please consider referral to hematology-May need indefinite anticoagulation given large clot burden. 4. Please ensure outpatient follow-up with endocrinology. 5. Consider outpatient referral to GI-see below. 6. Resume antihypertensives when able 7. CBG stable with SSI-resume her other medications/usual insulin regimen when able.  Admitted From:  SNF  Disposition: SNF   Home Health: No  Equipment/Devices: None  Discharge Condition: Stable  CODE STATUS: FULL CODE  Diet recommendation:  Diet Order            Diet - low sodium heart healthy           Diet Heart Room service appropriate? Yes; Fluid consistency: Thin  Diet effective now                  Brief Narrative: Patient is a 86 y.o. female history of hypothyroidism, chronic diastolic heart failure, DM-2, CAD s/p remote PCI, PAF, spinal stenosis, CKD stage IIIa, HTN-presented to the hospital on 11/25 from a SNF after a syncopal episode-further evaluation revealed a massive pulmonary embolism-she was given TPA-and admitted to the ICU by PCCM. Upon further stability-she was transferred to the [facility] hospitalist service. See below for further details.  Significant events: 11/25>> admit to ICU-syncope/hypotensive/bradycardic-massive PE-given TPA. 11/28>> transfer to [facility]  Significant studies: 11/25>> CT head: No acute intracranial findings. 11/25>> CTA chest: Extensive pulmonary emboli, CT evidence of right heart strain, multinodular goiter 11/26>> Echo: EF 60-65%, right ventricular systolic function is normal, PA pressure of 46.6. 11/28>> x-ray right knee: Moderate  joint effusion, marked OA changes, chronic calcification in the medial collateral ligament. 11/29>> bilateral lower extremity Doppler: No DVT  Antimicrobial therapy: None  Microbiology data: 11/25>> blood cultures: No growth  Procedures : None  Consults: PCCM  Brief Hospital Course: Massive PE with obstructive shock: S/p TPA on admission-shock is resolved-BP now stable. Has been transitioned to Eliquis.  Microcytic anemia: Iron indices consistent with iron deficiency-patient not sure when her last colonoscopy was.  Reviewed flowsheet-apparently has had brown stools-no overt GI bleeding noted.  Concern for slow oozing from the GI tract.Difficult situation-has had life-threatening PE-needs uninterrupted anticoagulation ideally for a few months before proceeding with endoscopic evaluation.  Since not overtly bleeding-plan is to manage medically as possible with close CBC monitoring. If anemia significantly worsens or if patient develops overt GI bleeding-we will plan on GI consultation.  Continue to follow CBC closely at SNF  Hyperthyroidism: On methimazole-follows with endocrinology in the outpatient setting. Defer further adjustments to endocrinology.  DM-2 (A1c 6.5 on 10/24): CBG stable with SSI  HTN: BP currently stable-all antihypertensives on hold-resume when able.  HLD: Continue statin  History of CAD: No anginal symptoms-we will stop aspirin given she is now on anticoagulation with Eliquis-and worsening anemia.  CKD stage IIIa: Creatinine close to baseline-follow periodically.  OA Right knee: Claims to have pain in her right knee-ongoing for the past several weeks--x-rays confirm marked OA changes-moderate joint effusion.  As noted above-patient does not want any further arthrocentesis-claims she has had 2 arthrocentesis in the past and really does not want to subject herself to arthrocentesis.  Continue supportive care-this seems to be a chronic issue for the  patient.  Constipation:  Had BM yesterday-continue with MiraLAX  Debility/deconditioning: Appreciate PT/OT eval-plans are for SNF on discharge.  Unfortunately-insurance declining SNF-I did a peer to peer with the insurance medical director-Dr. Johann Capers was informed by social work that patient still being denied.    Family subsequently filed an appeal with insurance-apparently has has won the appeal-patient being discharged to SNF.  Obesity: Estimated body mass index is 35.34 kg/m as calculated from the following:   Height as of this encounter: 5\' 3"  (1.6 m).   Weight as of this encounter: 90.5 kg.   RN pressure injury documentation: Pressure Injury 02/06/20 Ankle Left;Posterior Deep Tissue Pressure Injury - Purple or maroon localized area of discolored intact skin or blood-filled blister due to damage of underlying soft tissue from pressure and/or shear. (Active)  02/06/20 1857  Location: Ankle  Location Orientation: Left;Posterior  Staging: Deep Tissue Pressure Injury - Purple or maroon localized area of discolored intact skin or blood-filled blister due to damage of underlying soft tissue from pressure and/or shear.  Wound Description (Comments):   Present on Admission:      Pressure Injury 02/06/20 Ankle Posterior;Right Deep Tissue Pressure Injury - Purple or maroon localized area of discolored intact skin or blood-filled blister due to damage of underlying soft tissue from pressure and/or shear. (Active)  02/06/20 1857  Location: Ankle  Location Orientation: Posterior;Right  Staging: Deep Tissue Pressure Injury - Purple or maroon localized area of discolored intact skin or blood-filled blister due to damage of underlying soft tissue from pressure and/or shear.  Wound Description (Comments):   Present on Admission:      Discharge Diagnoses:  Active Problems:   Shock [facility])   Discharge Instructions:  Activity:  As tolerated with Full fall precautions use walker/cane &  assistance as needed   Discharge Instructions    Call MD for:  difficulty breathing, headache or visual disturbances   Complete by: As directed    Diet - low sodium heart healthy   Complete by: As directed    Discharge instructions   Complete by: As directed    Follow with Primary MD  Pincus Sanes, MD in 1-2 weeks  Please get a complete blood count and chemistry panel checked by your Primary MD at your next visit, and again as instructed by your Primary MD.  Get Medicines reviewed and adjusted: Please take all your medications with you for your next visit with your Primary MD  Laboratory/radiological data: Please request your Primary MD to go over all hospital tests and procedure/radiological results at the follow up, please ask your Primary MD to get all Hospital records sent to his/her office.  In some cases, they will be blood work, cultures and biopsy results pending at the time of your discharge. Please request that your primary care M.D. follows up on these results.  Also Note the following: If you experience worsening of your admission symptoms, develop shortness of breath, life threatening emergency, suicidal or homicidal thoughts you must seek medical attention immediately by calling 911 or calling your MD immediately  if symptoms less severe.  You must read complete instructions/literature along with all the possible adverse reactions/side effects for all the Medicines you take and that have been prescribed to you. Take any new Medicines after you have completely understood and accpet all the possible adverse reactions/side effects.   Do not drive when taking Pain medications or sleeping medications (Benzodaizepines)  Do not take more than prescribed Pain, Sleep and Anxiety Medications. It is not advisable to  combine anxiety,sleep and pain medications without talking with your primary care practitioner  Special Instructions: If you have smoked or chewed Tobacco  in the last  2 yrs please stop smoking, stop any regular Alcohol  and or any Recreational drug use.  Wear Seat belts while driving.  Please note: You were cared for by a hospitalist during your hospital stay. Once you are discharged, your primary care physician will handle any further medical issues. Please note that NO REFILLS for any discharge medications will be authorized once you are discharged, as it is imperative that you return to your primary care physician (or establish a relationship with a primary care physician if you do not have one) for your post hospital discharge needs so that they can reassess your need for medications and monitor your lab values.   Check CBGs before meals and at bedtime   Increase activity slowly   Complete by: As directed    No wound care   Complete by: As directed      Allergies as of 02/11/2020      Reactions   Codeine    "Makes me drunk"      Medication List    STOP taking these medications   aspirin 81 MG tablet   HumuLIN 70/30 KwikPen (70-30) 100 UNIT/ML KwikPen Generic drug: insulin isophane & regular human   isosorbide mononitrate 30 MG 24 hr tablet Commonly known as: IMDUR   lisinopril 20 MG tablet Commonly known as: ZESTRIL   omeprazole 20 MG capsule Commonly known as: PRILOSEC   polyethylene glycol powder 17 GM/SCOOP powder Commonly known as: GLYCOLAX/MIRALAX Replaced by: polyethylene glycol 17 g packet   propranolol 20 MG tablet Commonly known as: INDERAL     TAKE these medications   acetaminophen 500 MG tablet Commonly known as: TYLENOL Take 1,000 mg by mouth every 6 (six) hours as needed for moderate pain.   apixaban 5 MG Tabs tablet Commonly known as: ELIQUIS Take 1 tablet (5 mg total) by mouth 2 (two) times daily.   Aspercreme 10 % Lotn Generic drug: Trolamine Salicylate Apply 1 application topically 2 (two) times daily as needed (knee pain). Use twice daily as needed for right knee pain.   atorvastatin 10 MG  tablet Commonly known as: LIPITOR TAKE 1 TABLET BY MOUTH  DAILY AT 6 PM What changed:   how much to take  how to take this  when to take this  additional instructions   brimonidine 0.2 % ophthalmic solution Commonly known as: ALPHAGAN Place 1 drop into the right eye 2 (two) times daily.   calcium carbonate 600 MG Tabs tablet Commonly known as: OS-CAL Take 600 mg by mouth daily.   Cod Liver Oil 1000 MG Caps Take 1,000 mg by mouth daily.   DORZOLAMIDE HCL-TIMOLOL MAL OP Place 1 drop into the right eye 2 (two) times daily.   ferrous sulfate 325 (65 FE) MG tablet Take 1 tablet (325 mg total) by mouth 2 (two) times daily with a meal.   gabapentin 300 MG capsule Commonly known as: NEURONTIN Take 300 mg by mouth daily.   Glucerna Liqd Take 237 mLs by mouth daily.   insulin aspart 100 UNIT/ML injection Commonly known as: novoLOG 0-15 Units, Subcutaneous, 3 times daily with meals CBG < 70: Implement Hypoglycemia measures, call MD CBG 70 - 120: 0 units CBG 121 - 150: 2 units CBG 151 - 200: 3 units CBG 201 - 250: 5 units CBG 251 - 300: 8 units CBG  301 - 350: 11 units CBG 351 - 400: 15 units CBG > 400: call MD   latanoprost 0.005 % ophthalmic solution Commonly known as: XALATAN Place 1 drop into the right eye at bedtime.   methimazole 10 MG tablet Commonly known as: TAPAZOLE Take 10 mg by mouth 2 (two) times daily.   multivitamin tablet Take 1 tablet by mouth daily. Prn   nitroGLYCERIN 0.4 MG SL tablet Commonly known as: Nitrostat DISSOLVE ONE TABLET UNDER TONGUE AS DIRECTED FOR CHEST PAIN What changed:   how much to take  how to take this  when to take this  reasons to take this  additional instructions   pantoprazole 40 MG tablet Commonly known as: PROTONIX Take 1 tablet (40 mg total) by mouth 2 (two) times daily.   polyethylene glycol 17 g packet Commonly known as: MIRALAX / GLYCOLAX Take 17 g by mouth daily. Start taking on: February 12, 2020 Replaces: polyethylene glycol powder 17 GM/SCOOP powder   vitamin C 500 MG tablet Commonly known as: ASCORBIC ACID Take 500 mg by mouth 2 (two) times daily.       Contact information for after-discharge care    Destination    [facility] Preferred SNF .   Service: Skilled Nursing Contact information: [address] [place] [address] [phone]                 Allergies  Allergen Reactions  . Codeine     "Makes me drunk"     Other Procedures/Studies: DG Knee 1-2 Views Right  Result Date: 02/07/2020 CLINICAL DATA:  Pain EXAM: RIGHT KNEE - 1-2 VIEW COMPARISON:  December 28, 2015 FINDINGS: Frontal and lateral views were obtained. Bones are osteoporotic. There is no acute fracture or dislocation. There is a moderate knee joint effusion. There is marked joint space narrowing laterally, progressed from prior study. Moderate joint space narrowing medially and in the patellofemoral joint. Spurring in all compartments noted. Chondrocalcinosis noted. Calcification is noted in the medial collateral ligament on the left, stable. Incomplete visualization of sclerosis in the distal femoral diaphysis likely is due to prior bone infarct. Foci of arterial vascular calcification noted at multiple sites. IMPRESSION: 1.  Moderate joint effusion.  No fracture or dislocation. 2.  Osteoporosis.  Probable bone infarct distal femur. 3. Marked osteoarthritic change laterally with progression of narrowing compared to prior study. Moderate osteoarthritic change elsewhere. Chondrocalcinosis noted, a finding that may be seen with osteoarthritis or calcium pyrophosphate deposition disease. 4. Chronic calcification in the medial collateral ligament superiorly. 5.  Foci of atherosclerosis. Electronically Signed   By: Bretta Bang III M.D.   On: 02/07/2020 11:05   CT Head Wo Contrast  Result Date: 02/04/2020 CLINICAL DATA:  Seizure EXAM: CT HEAD WITHOUT CONTRAST  TECHNIQUE: Contiguous axial images were obtained from the base of the skull through the vertex without intravenous contrast. COMPARISON:  MRI 01/03/2020 FINDINGS: Brain: No evidence of acute infarction, hemorrhage, hydrocephalus, extra-axial collection or mass lesion/mass effect. Mild low-density changes within the periventricular and subcortical white matter compatible with chronic microvascular ischemic change. Mild diffuse cerebral volume loss. Vascular: Atherosclerotic calcifications involving the large vessels of the skull base. No unexpected hyperdense vessel. Skull: Normal. Negative for fracture or focal lesion. Sinuses/Orbits: No acute finding. Other: None. IMPRESSION: 1. No acute intracranial findings. 2. Chronic microvascular ischemic change and cerebral volume loss. Electronically Signed   By: Duanne Guess D.O.   On: 02/04/2020 15:13   CT ANGIO CHEST PE  W OR WO CONTRAST  Result Date: 02/04/2020 CLINICAL DATA:  86 year old who sustained a possible syncopal episode earlier today, associated with diaphoresis. Patient hypotensive in the emergency department. EXAM: CT ANGIOGRAPHY CHEST WITH CONTRAST TECHNIQUE: Multidetector CT imaging of the chest was performed using the standard protocol during bolus administration of intravenous contrast. Multiplanar CT image reconstructions and MIPs were obtained to evaluate the vascular anatomy. CONTRAST:  OMNIPAQUE IOHEXOL 350 MG/ML SOLN COMPARISON:  None. FINDINGS: Cardiovascular: Contrast opacification of pulmonary arteries is moderate. Respiratory motion blurred many of the images. Overall, the study is of moderate diagnostic quality. Extensive BILATERAL pulmonary emboli are present in the distal main pulmonary arteries bilaterally extending into the segmental pulmonary arteries and into subsegmental pulmonary arteries bilaterally. There is evidence of RIGHT heart strain as the RV/LV ratio measures approximately 1.4. Moderate to marked cardiomegaly with  RIGHT atrial and RIGHT ventricular enlargement in particular. Reflux of contrast into the intrahepatic IVC and the hepatic veins. Severe three-vessel coronary atherosclerosis. No pericardial effusion. Moderate atherosclerosis involving the thoracic and upper abdominal aorta without evidence of aneurysm. Mediastinum/Nodes: No pathologic lymphadenopathy. Thyroid gland enlargement with multiple nodules. Large hiatal hernia. Normal appearing gas-filled esophagus. Lungs/Pleura: Small LEFT pleural effusion. Dependent atelectasis posteriorly in the lower lobes. No confluent airspace consolidation. Ground-glass opacities in the LEFT UPPER LOBE anteriorly. No parenchymal nodules or masses. Central airways patent without significant bronchial wall thickening. Mild bronchiectasis in the lower lobes. Upper Abdomen: No acute findings apart from large stool burden in the visualized splenic flexure of the colon. Musculoskeletal: Osseous demineralization. Diffuse degenerative disc disease and spondylosis throughout the lower cervical and thoracic spine. Review of the MIP images confirms the above findings. IMPRESSION: 1. Extensive BILATERAL pulmonary emboli. CT evidence of RIGHT heart strain (RV/LV Ratio 1.4) indicating at least submassive (intermediate risk) PE. The presence of right heart strain has been associated with an increased risk of morbidity and mortality. 2. Small LEFT pleural effusion. 3. Dependent atelectasis posteriorly involving the lower lobes. 4. Ground-glass opacities in the LEFT UPPER LOBE anteriorly, possibly representing early infarct but also seen in acute infection or inflammation. As this is an isolated finding in the LEFT UPPER LOBE, it would be an atypical presentation for COVID-19. 5. Large hiatal hernia. 6. Multinodular goiter. 7. Large stool burden in the visualized splenic flexure of the colon. Aortic Atherosclerosis (ICD10-I70.0). I telephoned these results at the time of interpretation on 02/04/2020  at 3:35 pm to provider Dr. Katrinka Blazing of Critical Care, who verbally acknowledged these results. Electronically Signed   By: Hulan Saas M.D.   On: 02/04/2020 15:36   DG Chest Port 1 View  Result Date: 02/04/2020 CLINICAL DATA:  86 year old female with possible syncopal episode. EXAM: PORTABLE CHEST 1 VIEW COMPARISON:  01/05/2020 FINDINGS: Defibrillator pads overlie the left hemithorax. Suggestion of similar appearing moderate cardiomegaly. The cardiomediastinal silhouette is otherwise unchanged. Atherosclerotic calcification of the aortic arch. No definite focal consolidations. No significant pleural effusion or pneumothorax. No acute osseous abnormality. IMPRESSION: 1. No acute cardiopulmonary process. 2. Similar appearing cardiomegaly. 3.  Aortic Atherosclerosis (ICD10-I70.0). Electronically Signed   By: Marliss Coots MD   On: 02/04/2020 14:18   ECHOCARDIOGRAM COMPLETE  Result Date: 02/05/2020    ECHOCARDIOGRAM REPORT   Patient Name:   ALLEE BUSK [facility] Date of Exam: 02/05/2020 Medical Rec #:  [id number]            Height:       63.0 in Accession #:    [id number]  Weight:       188.0 lb Date of Birth:  11-07-1932            BSA:          1.883 m Patient Age:    87 years             BP:           112/51 mmHg Patient Gender: F                    HR:           55 bpm. Exam Location:  Inpatient Procedure: 2D Echo, Color Doppler and Cardiac Doppler Indications:    Pulmonary Embolus 415.19 / I26.99  History:        Patient has prior history of Echocardiogram examinations, most                 recent 01/03/2020. CAD; Risk Factors:Diabetes and Hypertension.  Sonographer:    Eulah Pont RDCS Referring Phys: [id number] Virl Axe CLARK IMPRESSIONS  1. Left ventricular ejection fraction, by estimation, is 60 to 65%. The left ventricle has normal function. The left ventricle has no regional wall motion abnormalities. Left ventricular diastolic parameters are consistent with Grade I diastolic dysfunction  (impaired relaxation). Elevated left atrial pressure. There is the interventricular septum is flattened in systole, consistent with right ventricular pressure overload.  2. The right ventricular free wall is hypokinetic, but apical motion and overall systolic function are preserved (McConnell's sign). Right ventricular systolic function is normal. The right ventricular size is mildly enlarged. There is moderately elevated pulmonary artery systolic pressure. The estimated right ventricular systolic pressure is 46.6 mmHg.  3. Catheter in the right atrium. Right atrial size was mildly dilated.  4. The mitral valve is normal in structure. No evidence of mitral valve regurgitation. No evidence of mitral stenosis.  5. The aortic valve is normal in structure. Aortic valve regurgitation is not visualized. No aortic stenosis is present.  6. The inferior vena cava is normal in size with greater than 50% respiratory variability, suggesting right atrial pressure of 3 mmHg. Comparison(s): A prior study was performed on 01/03/2020. Prior images reviewed side by side. The right ventricular free wall appears to be hypokinetic, there is abnormal ventricular septal motion and there is a catheter in the right atrium. otherwise little change. FINDINGS  Left Ventricle: Left ventricular ejection fraction, by estimation, is 60 to 65%. The left ventricle has normal function. The left ventricle has no regional wall motion abnormalities. The left ventricular internal cavity size was normal in size. There is  no left ventricular hypertrophy. The interventricular septum is flattened in systole, consistent with right ventricular pressure overload. Left ventricular diastolic parameters are consistent with Grade I diastolic dysfunction (impaired relaxation). Elevated left atrial pressure. Right Ventricle: The right ventricular free wall is hypokinetic, but apical motion and overall systolic function are preserved (McConnell's sign). The right  ventricular size is mildly enlarged. No increase in right ventricular wall thickness. Right ventricular systolic function is normal. There is moderately elevated pulmonary artery systolic pressure. The tricuspid regurgitant velocity is 3.30 m/s, and with an assumed right atrial pressure of 3 mmHg, the estimated right ventricular systolic pressure is 46.6 mmHg. Left Atrium: Left atrial size was normal in size. Right Atrium: Catheter in the right atrium. Right atrial size was mildly dilated. Pericardium: There is no evidence of pericardial effusion. Mitral Valve: The mitral valve is normal in structure. No evidence of  mitral valve regurgitation. No evidence of mitral valve stenosis. Tricuspid Valve: The tricuspid valve is normal in structure. Tricuspid valve regurgitation is mild . No evidence of tricuspid stenosis. Aortic Valve: The aortic valve is normal in structure. Aortic valve regurgitation is not visualized. No aortic stenosis is present. Pulmonic Valve: The pulmonic valve was normal in structure. Pulmonic valve regurgitation is mild. No evidence of pulmonic stenosis. Aorta: The aortic root is normal in size and structure. Venous: The inferior vena cava is normal in size with greater than 50% respiratory variability, suggesting right atrial pressure of 3 mmHg. IAS/Shunts: No atrial level shunt detected by color flow Doppler.  LEFT VENTRICLE PLAX 2D LVIDd:         3.10 cm  Diastology LVIDs:         2.20 cm  LV e' medial:    4.46 cm/s LV PW:         1.00 cm  LV E/e' medial:  18.0 LV IVS:        1.10 cm  LV e' lateral:   4.90 cm/s LVOT diam:     2.10 cm  LV E/e' lateral: 16.4 LV SV:         94 LV SV Index:   50 LVOT Area:     3.46 cm  RIGHT VENTRICLE RV S prime:     14.70 cm/s TAPSE (M-mode): 2.5 cm LEFT ATRIUM             Index       RIGHT ATRIUM           Index LA diam:        3.60 cm 1.91 cm/m  RA Area:     22.60 cm LA Vol (A2C):   59.8 ml 31.75 ml/m RA Volume:   71.70 ml  38.07 ml/m LA Vol (A4C):   65.0  ml 34.51 ml/m LA Biplane Vol: 63.6 ml 33.77 ml/m  AORTIC VALVE LVOT Vmax:   123.00 cm/s LVOT Vmean:  87.200 cm/s LVOT VTI:    0.272 m  AORTA Ao Root diam: 3.60 cm Ao Asc diam:  3.10 cm MITRAL VALVE                TRICUSPID VALVE MV Area (PHT): 2.50 cm     TR Peak grad:   43.6 mmHg MV Decel Time: 303 msec     TR Vmax:        330.00 cm/s MV E velocity: 80.50 cm/s MV A velocity: 113.00 cm/s  SHUNTS MV E/A ratio:  0.71         Systemic VTI:  0.27 m                             Systemic Diam: 2.10 cm Rachelle Hora Croitoru MD Electronically signed by Thurmon Fair MD Signature Date/Time: 02/05/2020/9:44:01 AM    Final    VAS [place] LOWER EXTREMITY VENOUS (DVT)  Result Date: 02/08/2020  Lower Venous DVT Study Indications: Swelling, and Edema.  Comparison Study: 01/04/20 previous Performing Technologist: Blanch Media RVS  Examination Guidelines: A complete evaluation includes B-mode imaging, spectral Doppler, color Doppler, and power Doppler as needed of all accessible portions of each vessel. Bilateral testing is considered an integral part of a complete examination. Limited examinations for reoccurring indications may be performed as noted. The reflux portion of the exam is performed with the patient in reverse Trendelenburg.  +---------+---------------+---------+-----------+----------+-------------------+ RIGHT    CompressibilityPhasicitySpontaneityPropertiesThrombus Aging      +---------+---------------+---------+-----------+----------+-------------------+  CFV      Full           Yes      Yes                                      +---------+---------------+---------+-----------+----------+-------------------+ SFJ      Full                                                             +---------+---------------+---------+-----------+----------+-------------------+ FV Prox  Full                                                              +---------+---------------+---------+-----------+----------+-------------------+ FV Mid   Full                                                             +---------+---------------+---------+-----------+----------+-------------------+ FV DistalFull                                                             +---------+---------------+---------+-----------+----------+-------------------+ PFV      Full                                                             +---------+---------------+---------+-----------+----------+-------------------+ POP      Full           Yes      Yes                                      +---------+---------------+---------+-----------+----------+-------------------+ PTV      Full                                                             +---------+---------------+---------+-----------+----------+-------------------+ PERO                                                  Not well visualized +---------+---------------+---------+-----------+----------+-------------------+   +---------+---------------+---------+-----------+----------+-------------------+ LEFT     CompressibilityPhasicitySpontaneityPropertiesThrombus Aging      +---------+---------------+---------+-----------+----------+-------------------+ CFV      Full  Yes      Yes                                      +---------+---------------+---------+-----------+----------+-------------------+ SFJ      Full                                                             +---------+---------------+---------+-----------+----------+-------------------+ FV Prox  Full                                                             +---------+---------------+---------+-----------+----------+-------------------+ FV Mid   Full                                                             +---------+---------------+---------+-----------+----------+-------------------+ FV  DistalFull                                                             +---------+---------------+---------+-----------+----------+-------------------+ PFV      Full                                                             +---------+---------------+---------+-----------+----------+-------------------+ POP      Full           Yes      Yes                                      +---------+---------------+---------+-----------+----------+-------------------+ PTV      Full                                                             +---------+---------------+---------+-----------+----------+-------------------+ PERO                                                  Not well visualized +---------+---------------+---------+-----------+----------+-------------------+     Summary: BILATERAL: - No evidence of deep vein thrombosis seen in the lower extremities, bilaterally. - No evidence of superficial venous thrombosis in the lower extremities, bilaterally. -No evidence of popliteal cyst, bilaterally.   *See  table(s) above for measurements and observations. Electronically signed by Fabienne Bruns MD on 02/08/2020 at 3:54:48 PM.    Final      TODAY-DAY OF DISCHARGE:  Subjective:   Cerys Pint today has no headache,no chest abdominal pain,no new weakness tingling or numbness, feels much better wants to go home today.   Objective:   Blood pressure (!) 119/58, pulse 69, temperature 98.7 F (37.1 C), temperature source Oral, resp. rate 15, height 5\' 3"  (1.6 m), weight 90.5 kg, SpO2 98 %.  Intake/Output Summary (Last 24 hours) at 02/11/2020 1108 Last data filed at 02/11/2020 0900 Gross per 24 hour  Intake 670 ml  Output 600 ml  Net 70 ml   Filed Weights   02/09/20 0500 02/10/20 0427 02/11/20 0409  Weight: 90.3 kg 92.4 kg 90.5 kg    Exam: Awake Alert, Oriented *3, No new F.N deficits, Normal affect [place].AT,PERRAL Supple Neck,No JVD, No cervical lymphadenopathy  appriciated.  Symmetrical Chest wall movement, Good air movement bilaterally, CTAB RRR,No Gallops,Rubs or new Murmurs, No Parasternal Heave +ve B.Sounds, Abd Soft, Non tender, No organomegaly appriciated, No rebound -guarding or rigidity. No Cyanosis, Clubbing or edema, No new Rash or bruise   PERTINENT RADIOLOGIC STUDIES: No results found.   PERTINENT LAB RESULTS: CBC: Recent Labs    02/09/20 0330 02/10/20 0042  WBC 11.5* 10.0  HGB 7.4* 9.1*  HCT 22.7* 27.5*  PLT 333 375   CMET CMP     Component Value Date/Time   NA 133 (L) 02/08/2020 0207   K 3.9 02/08/2020 0207   CL 98 02/08/2020 0207   CO2 25 02/08/2020 0207   GLUCOSE 161 (H) 02/08/2020 0207   BUN 12 02/08/2020 0207   CREATININE 0.93 02/08/2020 0207   CREATININE 1.27 (H) 11/30/2019 1336   CALCIUM 8.2 (L) 02/08/2020 0207   PROT 4.4 (L) 02/06/2020 0439   ALBUMIN 1.4 (L) 02/06/2020 0439   AST 23 02/06/2020 0439   ALT 15 02/06/2020 0439   ALKPHOS 110 02/06/2020 0439   BILITOT 0.4 02/06/2020 0439   GFRNONAA >60 02/08/2020 0207   GFRNONAA 38 (L) 11/30/2019 1336   GFRAA 45 (L) 11/30/2019 1336    GFR Estimated Creatinine Clearance: 47.2 mL/min (by C-G formula based on SCr of 0.93 mg/dL). No results for input(s): LIPASE, AMYLASE in the last 72 hours. No results for input(s): CKTOTAL, CKMB, CKMBINDEX, TROPONINI in the last 72 hours. Invalid input(s): POCBNP No results for input(s): DDIMER in the last 72 hours. No results for input(s): HGBA1C in the last 72 hours. No results for input(s): CHOL, HDL, LDLCALC, TRIG, CHOLHDL, LDLDIRECT in the last 72 hours. No results for input(s): TSH, T4TOTAL, T3FREE, THYROIDAB in the last 72 hours.  Invalid input(s): FREET3 No results for input(s): VITAMINB12, FOLATE, FERRITIN, TIBC, IRON, RETICCTPCT in the last 72 hours. Coags: No results for input(s): INR in the last 72 hours.  Invalid input(s): PT Microbiology: Recent Results (from the past 240 hour(s))  Blood culture  (routine x 2)     Status: None   Collection Time: 02/04/20  1:44 PM   Specimen: BLOOD  Result Value Ref Range Status   Specimen Description BLOOD SITE NOT SPECIFIED  Final   Special Requests   Final    BOTTLES DRAWN AEROBIC AND ANAEROBIC Blood Culture results may not be optimal due to an inadequate volume of blood received in culture bottles   Culture   Final    NO GROWTH 5 DAYS Performed at [facility], [address] [place], [place] [address]    Report Status 02/09/2020 FINAL  Final  Blood culture (routine x 2)     Status: None   Collection Time: 02/04/20  1:49 PM   Specimen: BLOOD  Result Value Ref Range Status   Specimen Description BLOOD SITE NOT SPECIFIED  Final   Special Requests   Final    BOTTLES DRAWN AEROBIC AND ANAEROBIC Blood Culture results may not be optimal due to an inadequate volume of blood received in culture bottles   Culture   Final    NO GROWTH 5 DAYS Performed at [facility], [address], [place], [place] [address]    Report Status 02/09/2020 FINAL  Final  Resp Panel by RT-PCR (Flu A&B, Covid) Nasopharyngeal Swab     Status: None   Collection Time: 02/04/20  3:04 PM   Specimen: Nasopharyngeal Swab; Nasopharyngeal(NP) swabs in vial transport medium  Result Value Ref Range Status   SARS Coronavirus 2 by RT PCR NEGATIVE NEGATIVE Final    Comment: (NOTE) SARS-CoV-2 target nucleic acids are NOT DETECTED.  The SARS-CoV-2 RNA is generally detectable in upper respiratory specimens during the acute phase of infection. The lowest concentration of SARS-CoV-2 viral copies this assay can detect is 138 copies/mL. A negative result does not preclude SARS-Cov-2 infection and should not be used as the sole basis for treatment or other patient management decisions. A negative result may occur with  improper specimen collection/handling, submission of specimen other than nasopharyngeal swab, presence of viral mutation(s) within the areas targeted by  this assay, and inadequate number of viral copies(<138 copies/mL). A negative result must be combined with clinical observations, patient history, and epidemiological information. The expected result is Negative.  Fact Sheet for Patients:  BloggerCourse.com  Fact Sheet for Healthcare Providers:  SeriousBroker.it  This test is no t yet approved or cleared by the [place] FDA and  has been authorized for detection and/or diagnosis of SARS-CoV-2 by FDA under an Emergency Use Authorization (EUA). This EUA will remain  in effect (meaning this test can be used) for the duration of the COVID-19 declaration under Section 564(b)(1) of the Act, 21 U.S.C.section 360bbb-3(b)(1), unless the authorization is terminated  or revoked sooner.       Influenza A by PCR NEGATIVE NEGATIVE Final   Influenza B by PCR NEGATIVE NEGATIVE Final    Comment: (NOTE) The Xpert Xpress SARS-CoV-2/FLU/RSV plus assay is intended as an aid in the diagnosis of influenza from Nasopharyngeal swab specimens and should not be used as a sole basis for treatment. Nasal washings and aspirates are unacceptable for Xpert Xpress SARS-CoV-2/FLU/RSV testing.  Fact Sheet for Patients: BloggerCourse.com  Fact Sheet for Healthcare Providers: SeriousBroker.it  This test is not yet approved or cleared by the [place] FDA and has been authorized for detection and/or diagnosis of SARS-CoV-2 by FDA under an Emergency Use Authorization (EUA). This EUA will remain in effect (meaning this test can be used) for the duration of the COVID-19 declaration under Section 564(b)(1) of the Act, 21 U.S.C. section 360bbb-3(b)(1), unless the authorization is terminated or revoked.  Performed at [facility], [address], [place], [place] [address]   MRSA PCR Screening     Status: None   Collection Time: 02/04/20  5:44 PM    Specimen: Nasal Mucosa; Nasopharyngeal  Result Value Ref Range Status   MRSA by PCR NEGATIVE NEGATIVE Final    Comment:        The GeneXpert  MRSA Assay (FDA approved for NASAL specimens only), is one component of a comprehensive MRSA colonization surveillance program. It is not intended to diagnose MRSA infection nor to guide or monitor treatment for MRSA infections. Performed at [facility], [address] [place], [place] [id number]     FURTHER DISCHARGE INSTRUCTIONS:  Get Medicines reviewed and adjusted: Please take all your medications with you for your next visit with your Primary MD  Laboratory/radiological data: Please request your Primary MD to go over all hospital tests and procedure/radiological results at the follow up, please ask your Primary MD to get all Hospital records sent to his/her office.  In some cases, they will be blood work, cultures and biopsy results pending at the time of your discharge. Please request that your primary care M.D. goes through all the records of your hospital data and follows up on these results.  Also Note the following: If you experience worsening of your admission symptoms, develop shortness of breath, life threatening emergency, suicidal or homicidal thoughts you must seek medical attention immediately by calling 911 or calling your MD immediately  if symptoms less severe.  You must read complete instructions/literature along with all the possible adverse reactions/side effects for all the Medicines you take and that have been prescribed to you. Take any new Medicines after you have completely understood and accpet all the possible adverse reactions/side effects.   Do not drive when taking Pain medications or sleeping medications (Benzodaizepines)  Do not take more than prescribed Pain, Sleep and Anxiety Medications. It is not advisable to combine anxiety,sleep and pain medications without talking with your primary care  practitioner  Special Instructions: If you have smoked or chewed Tobacco  in the last 2 yrs please stop smoking, stop any regular Alcohol  and or any Recreational drug use.  Wear Seat belts while driving.  Please note: You were cared for by a hospitalist during your hospital stay. Once you are discharged, your primary care physician will handle any further medical issues. Please note that NO REFILLS for any discharge medications will be authorized once you are discharged, as it is imperative that you return to your primary care physician (or establish a relationship with a primary care physician if you do not have one) for your post hospital discharge needs so that they can reassess your need for medications and monitor your lab values.  Total Time spent coordinating discharge including counseling, education and face to face time equals 35 minutes.  SignedJeoffrey Massed: Jodey Burbano 02/11/2020 11:08 AM

## 2020-02-11 NOTE — Progress Notes (Signed)
Occupational Therapy Treatment Patient Details Name: Rebecca [name] MRN: [id number] DOB: 08-07-1932 Today's Date: 02/11/2020    History of present illness 86 y/o F admitted 11/25 with syncopal episode, hypotension and bradycardia.  CTA positive for large pulmonary embolism, s/p tPA for hemodynamically significant PE. CT head was negative for acute process.  x-ray right knee: Moderate joint effusion, marked OA changes, chronic calcification in the medial collateral ligament. dopplers bil LEs negative for DVT  PMH-DM, HTN, HLD, CAD, anemia   OT comments  Pt seen for OT follow up session with focus on ADL mobility progression. Pt reported that she had had BM on arrival. She required max A +2 for rolling in bed and total A for peri hygiene. Cues and education given on importance of utilizing log roll technique to reduce back pain. Pt then finished session up in chair position, which she reports feels more comfortable than lying flat. Pt able to begin to pull self with BUEs to remove trunk off back of bed for positioning and pressure relief. Noted per chart review pt has been accepted into SNF. Will continue to follow as acutely admitted.    Follow Up Recommendations  SNF;Supervision/Assistance - 24 hour    Equipment Recommendations  3 in 1 bedside commode;Wheelchair (measurements OT);Wheelchair cushion (measurements OT);Hospital bed;Other (comment)    Recommendations for Other Services      Precautions / Restrictions Precautions Precautions: Fall Precaution Comments: painful R knee and low back Required Braces or Orthoses: Other Brace Other Brace: bil prevalaon boots (on admission) Restrictions Weight Bearing Restrictions: No       Mobility Bed Mobility Overal bed mobility: Needs Assistance Bed Mobility: Rolling Rolling: Max assist;+2 for physical assistance         General bed mobility comments: session focused on rolling for peri care 2/2 bowel incontinence. Emphasis given  on log roll technique to reduce back pain. Cues needed for step by step sequencing of transfer. Pt then placed in chair position for bed level exercises  Transfers                      Balance                                           ADL either performed or assessed with clinical judgement   ADL Overall ADL's : Needs assistance/impaired Eating/Feeding: Set up;Sitting   Grooming: Set up;Sitting Grooming Details (indicate cue type and reason): chair position                     Toileting- Clothing Manipulation and Hygiene: Total assistance;Bed level Toileting - Clothing Manipulation Details (indicate cue type and reason): incontinent of bowels in bed       General ADL Comments: session focused on rolling for peri care at total A level due to incontinence in bed. Pt required max A +2 for rolling with focus on log roll to decrease back pain     Vision Patient Visual Report: No change from baseline     Perception     Praxis      Cognition Arousal/Alertness: Awake/alert Behavior During Therapy: WFL for tasks assessed/performed Overall Cognitive Status: Impaired/Different from baseline Area of Impairment: Memory;Problem solving                     Memory: Decreased short-term memory  Problem Solving: Slow processing;Requires verbal cues General Comments: decreased STM; increased time and repetition to process/problem solve basic tasks        Exercises     Shoulder Instructions       General Comments      Pertinent Vitals/ Pain       Pain Assessment: Faces Faces Pain Scale: Hurts little more Pain Location: low back Pain Descriptors / Indicators: Grimacing;Guarding;Moaning;Spasm Pain Intervention(s): Limited activity within patient's tolerance;Monitored during session;Repositioned  Home Living                                          Prior Functioning/Environment              Frequency   Min 2X/week        Progress Toward Goals  OT Goals(current goals can now be found in the care plan section)  Progress towards OT goals: Progressing toward goals  Acute Rehab OT Goals Patient Stated Goal: go to rehab OT Goal Formulation: With patient Time For Goal Achievement: 02/21/20 Potential to Achieve Goals: Good  Plan Discharge plan remains appropriate    Co-evaluation    PT/OT/SLP Co-Evaluation/Treatment: Yes Reason for Co-Treatment: For patient/therapist safety;To address functional/ADL transfers   OT goals addressed during session: ADL's and self-care;Strengthening/ROM      AM-PAC OT "6 Clicks" Daily Activity     Outcome Measure   Help from another person eating meals?: A Little Help from another person taking care of personal grooming?: A Little Help from another person toileting, which includes using toliet, bedpan, or urinal?: Total Help from another person bathing (including washing, rinsing, drying)?: Total Help from another person to put on and taking off regular upper body clothing?: A Lot Help from another person to put on and taking off regular lower body clothing?: Total 6 Click Score: 11    End of Session Equipment Utilized During Treatment: Oxygen  OT Visit Diagnosis: Unsteadiness on feet (R26.81);Other abnormalities of gait and mobility (R26.89);Muscle weakness (generalized) (M62.81);Pain;Other symptoms and signs involving cognitive function Pain - part of body:  (back)   Activity Tolerance Patient tolerated treatment well   Patient Left in bed;with call bell/phone within reach;with bed alarm set   Nurse Communication Mobility status        Time: 2376-2831 OT Time Calculation (min): 37 min  Charges: OT General Charges $OT Visit: 1 Visit OT Treatments $Self Care/Home Management : 8-22 mins  Dalphine Handing, MSOT, OTR/L Acute Rehabilitation Services [facility] Office Number: [phone] Pager: [phone]  Dalphine Handing 02/11/2020, 1:29  PM

## 2020-02-11 NOTE — TOC Progression Note (Signed)
Transition of Care [facility]) - Progression Note    Patient Details  Name: Rebecca [name] MRN: [id number] Date of Birth: 04-01-1932  Transition [facility] Care [facility]) CM/SW Contact  Mearl Latin, LCSW Phone Number: 02/11/2020, 10:20 AM  Clinical Narrative:    CSW received call from insurance stating the family appeal has been approved: #[id number], effective through 12/6. [facility] aware and can accept patient once COVID test is back. CSW updated patient's daughter-in-law, Tania Ade.    Expected Discharge Plan: Skilled Nursing Facility Barriers to Discharge: Barriers Resolved  Expected Discharge Plan and Services Expected Discharge Plan: Skilled Nursing Facility In-house Referral: Clinical Social Work   Post Acute Care Choice: Skilled Nursing Facility Living arrangements for the past 2 months: Skilled Nursing Facility                                       Social Determinants of Health (SDOH) Interventions    Readmission Risk Interventions No flowsheet data found.

## 2020-02-11 NOTE — TOC Transition Note (Signed)
Transition of Care [facility]) - CM/SW Discharge Note   Patient Details  Name: Rebecca [name] MRN: [id number] Date of Birth: Dec 28, 1932  Transition of Care [facility]) CM/SW Contact:  Mearl Latin, LCSW Phone Number: 02/11/2020, 11:52 AM   Clinical Narrative:    Patient will DC to: [facility] Anticipated DC date: 02/11/20 Family notified: Daughter in Social worker and son Transport by: Sharin Mons once COVID test results   Please call DIL Tania Ade once PTAR arrives [phone].  Per MD patient ready for DC to [facility]. RN to call report prior to discharge [phone] Room 125). RN, patient, patient's family, and facility notified of DC. Discharge Summary and FL2 sent to facility. DC packet on chart. Ambulance transport requested for patient.   CSW will sign off for now as social work intervention is no longer needed. Please consult [place] again if new needs arise.      Final next level of care: Skilled Nursing Facility Barriers to Discharge: Barriers Resolved   Patient Goals and CMS Choice Patient states their goals for this hospitalization and ongoing recovery are:: Rehab CMS Medicare.gov Compare Post Acute Care list provided to:: Patient Represenative (must comment) Choice offered to / list presented to : Patient, Adult Children  Discharge Placement   Existing PASRR number confirmed : 02/11/20          Patient chooses bed at: [facility] Patient to be transferred to facility by: PTAR Name of family member notified: Son, DIL Patient and family notified of of transfer: 02/11/20  Discharge Plan and Services In-house Referral: Clinical Social Work   Post Acute Care Choice: Skilled Nursing Facility                               Social Determinants of Health (SDOH) Interventions     Readmission Risk Interventions No flowsheet data found.

## 2020-02-11 NOTE — Progress Notes (Signed)
Physical Therapy Treatment Patient Details Name: Rebecca [name] MRN: [id number] DOB: 12-25-1932 Today's Date: 02/11/2020    History of Present Illness 86 y/o F admitted 11/25 with syncopal episode, hypotension and bradycardia.  CTA positive for large pulmonary embolism, s/p tPA for hemodynamically significant PE. CT head was negative for acute process.  x-ray right knee: Moderate joint effusion, marked OA changes, chronic calcification in the medial collateral ligament. dopplers bil LEs negative for DVT  PMH-DM, HTN, HLD, CAD, anemia    PT Comments    Pt is very grateful to be able to go to SNF facility later today. Pt found to be incontinent of stool. Pt provided education on log rolling technique to reduce stress on back with rolling. Pt able to roll L and R 2x each with better alignment of hips and shoulders with maxA. Pt agreed that there was less pain with log rolling. Pt bed then placed in chair position to perform LE therex.    Follow Up Recommendations  SNF     Equipment Recommendations  Wheelchair (measurements PT);Wheelchair cushion (measurements PT);Hospital bed;Other (comment);3in1 (PT) (hoyer lift, sliding board)       Precautions / Restrictions Precautions Precautions: Fall Precaution Comments: painful R knee and low back Required Braces or Orthoses: Other Brace Other Brace: bil prevalaon boots (on admission) Restrictions Weight Bearing Restrictions: No    Mobility  Bed Mobility Overal bed mobility: Needs Assistance Bed Mobility: Rolling Rolling: Max assist;+2 for physical assistance         General bed mobility comments: session focused on rolling for peri care 2/2 bowel incontinence. Emphasis given on log roll technique to reduce back pain. Cues needed for step by step sequencing of transfer. Pt then placed in chair position for bed level exercises                             Cognition Arousal/Alertness: Awake/alert Behavior During  Therapy: WFL for tasks assessed/performed Overall Cognitive Status: Impaired/Different from baseline Area of Impairment: Memory;Problem solving                     Memory: Decreased short-term memory       Problem Solving: Slow processing;Requires verbal cues General Comments: decreased STM; increased time and repetition to process/problem solve basic tasks      Exercises General Exercises - Lower Extremity Short Arc Quad: AAROM;Both;10 reps;Seated Hip Flexion/Marching: AAROM;Both;10 reps;Seated    General Comments  VSS      Pertinent Vitals/Pain Pain Assessment: Faces Faces Pain Scale: Hurts little more Pain Location: low back Pain Descriptors / Indicators: Grimacing;Guarding;Moaning;Spasm Pain Intervention(s): Limited activity within patient's tolerance;Monitored during session;Repositioned           PT Goals (current goals can now be found in the care plan section) Acute Rehab PT Goals Patient Stated Goal: go to rehab PT Goal Formulation: With patient Time For Goal Achievement: 02/21/20 Potential to Achieve Goals: Fair    Frequency    Min 3X/week      PT Plan Discharge plan needs to be updated;Frequency needs to be updated    Co-evaluation   Reason for Co-Treatment: For patient/therapist safety;To address functional/ADL transfers   OT goals addressed during session: ADL's and self-care;Strengthening/ROM      AM-PAC PT "6 Clicks" Mobility   Outcome Measure  Help needed turning from your back to your side while in a flat bed without using bedrails?: A Lot Help needed moving from  lying on your back to sitting on the side of a flat bed without using bedrails?: A Lot Help needed moving to and from a bed to a chair (including a wheelchair)?: Total Help needed standing up from a chair using your arms (e.g., wheelchair or bedside chair)?: Total Help needed to walk in hospital room?: Total Help needed climbing 3-5 steps with a railing? : Total 6  Click Score: 8    End of Session Equipment Utilized During Treatment: Oxygen Activity Tolerance: Patient limited by fatigue;Patient limited by pain Patient left: in bed;with call bell/phone within reach;with bed alarm set;with family/visitor present Nurse Communication: Mobility status;Need for lift equipment PT Visit Diagnosis: Pain;Muscle weakness (generalized) (M62.81);Difficulty in walking, not elsewhere classified (R26.2) Pain - Right/Left: Right Pain - part of body: Knee     Time: 5400-8676 PT Time Calculation (min) (ACUTE ONLY): 37 min  Charges:  $Therapeutic Exercise: 8-22 mins                     Desha Bitner B. Beverely Risen PT, DPT Acute Rehabilitation Services Pager [phone] Office [phone]    Elon Alas Fleet 02/11/2020, 3:51 PM

## 2020-02-11 NOTE — Consult Note (Signed)
   [facility] CM Inpatient Consult   02/11/2020  Akeria Hedstrom [facility] November 26, 1932 [id number]   [place] [employer] [ACO] Patient:  [employer]  Less than 30 day readmission/showing medium risk   Patient initially in ICU admission noted  Patient was assessed for [facility] Management for community services for [facility] Medicare medication adherence and assistance in the past. Patient was previously active with [facility].    Reviewed for dispositionl to return to SNF, patient currently for [facility] after insurance approval, needs would be met for post hospital transition at the skilled nursing facility for rehab.  Spoke with patient via telephone regarding [facility] [place] Management, she request to call her daughter-in-law, Lars Mage or Eduard Clos her son to give information as she was in therapy at this time and couldn't talk further.  Of note, [facility] Care Management services does not replace or interfere with any services that are arranged by inpatient [facility] care management team.   For additional questions please contact:  Natividad Brood, RN BSN [facility] Liaison  [phone] business mobile phone Toll free office [phone]  Fax number: [phone] [email or web address]

## 2020-02-12 NOTE — Progress Notes (Signed)
Patient was transferred to [facility] by MD order.    Discharge instructions and care notes included in patient care packet and given to facility.  IVs DIC; patient will be transferred via PTAR.

## 2020-02-16 DIAGNOSIS — M25569 Pain in unspecified knee: Secondary | ICD-10-CM | POA: Diagnosis not present

## 2020-02-16 DIAGNOSIS — R5381 Other malaise: Secondary | ICD-10-CM | POA: Diagnosis not present

## 2020-02-16 DIAGNOSIS — R262 Difficulty in walking, not elsewhere classified: Secondary | ICD-10-CM | POA: Diagnosis not present

## 2020-02-16 DIAGNOSIS — M5459 Other low back pain: Secondary | ICD-10-CM | POA: Diagnosis not present

## 2020-02-18 DIAGNOSIS — L89620 Pressure ulcer of left heel, unstageable: Secondary | ICD-10-CM | POA: Diagnosis not present

## 2020-02-23 DIAGNOSIS — R262 Difficulty in walking, not elsewhere classified: Secondary | ICD-10-CM | POA: Diagnosis not present

## 2020-02-23 DIAGNOSIS — M25569 Pain in unspecified knee: Secondary | ICD-10-CM | POA: Diagnosis not present

## 2020-02-23 DIAGNOSIS — R5381 Other malaise: Secondary | ICD-10-CM | POA: Diagnosis not present

## 2020-02-23 DIAGNOSIS — M5459 Other low back pain: Secondary | ICD-10-CM | POA: Diagnosis not present

## 2020-02-25 DIAGNOSIS — L89620 Pressure ulcer of left heel, unstageable: Secondary | ICD-10-CM | POA: Diagnosis not present

## 2020-03-03 DIAGNOSIS — L89620 Pressure ulcer of left heel, unstageable: Secondary | ICD-10-CM | POA: Diagnosis not present

## 2020-03-07 DIAGNOSIS — I517 Cardiomegaly: Secondary | ICD-10-CM | POA: Diagnosis not present

## 2020-03-07 DIAGNOSIS — R509 Fever, unspecified: Secondary | ICD-10-CM | POA: Diagnosis not present

## 2020-03-09 DIAGNOSIS — I2699 Other pulmonary embolism without acute cor pulmonale: Secondary | ICD-10-CM | POA: Diagnosis not present

## 2020-03-09 DIAGNOSIS — R058 Other specified cough: Secondary | ICD-10-CM | POA: Diagnosis not present

## 2020-03-09 DIAGNOSIS — M6281 Muscle weakness (generalized): Secondary | ICD-10-CM | POA: Diagnosis not present

## 2020-03-09 DIAGNOSIS — Z7901 Long term (current) use of anticoagulants: Secondary | ICD-10-CM | POA: Diagnosis not present

## 2020-03-10 DIAGNOSIS — L89620 Pressure ulcer of left heel, unstageable: Secondary | ICD-10-CM | POA: Diagnosis not present

## 2020-03-17 ENCOUNTER — Encounter: Payer: Self-pay | Admitting: *Deleted

## 2020-03-17 DIAGNOSIS — L89610 Pressure ulcer of right heel, unstageable: Secondary | ICD-10-CM | POA: Diagnosis not present

## 2020-03-21 ENCOUNTER — Telehealth: Payer: Self-pay | Admitting: Internal Medicine

## 2020-03-21 NOTE — Telephone Encounter (Signed)
Patients daughter in law Tania Ade calling, around October the patient was in the hospital for a thyroid storm and when she was discharged she went to a rehab facility, she was there for two weeks and then was admitting in the hospital again because of a clot in her lung. When she was discharged from that she was sent to [facility] and she is still there and medicare has been trying to get her discharged from the facility and they have tried to appeal it now two times because they cannot help the patient at home because she needs two people to assist and no one is able to care for her at home. They don't want to put her in a long term care facility but they are realizing this might be the only option because of medicare refusing to pay for rehab. They are just looking for something to do even if it is long term care to help the patient get rehabilitated and hopefully return back home so they were reaching out to see if there was anything we could help with.  Tania [phone]

## 2020-03-21 NOTE — Telephone Encounter (Signed)
A lot of this is up to insurance and what they are willing to pay for.  The only thing we can do is get a Child psychotherapist to help with this.

## 2020-03-22 NOTE — Telephone Encounter (Signed)
  Daughter states she would gladly welcome assistance from a social worker

## 2020-03-22 NOTE — Telephone Encounter (Signed)
Called and left message for Tania Ade today to call back if interested in social worker stepping in.

## 2020-03-23 NOTE — Telephone Encounter (Signed)
Do they have a Child psychotherapist at the facility that can help  - since she is not at home I can not order a Child psychotherapist for her

## 2020-03-24 DIAGNOSIS — L89620 Pressure ulcer of left heel, unstageable: Secondary | ICD-10-CM | POA: Diagnosis not present

## 2020-03-24 NOTE — Telephone Encounter (Signed)
Letter written

## 2020-03-25 NOTE — Telephone Encounter (Signed)
Letter faxed this morning.

## 2020-04-04 DIAGNOSIS — G894 Chronic pain syndrome: Secondary | ICD-10-CM | POA: Diagnosis not present

## 2020-04-04 DIAGNOSIS — E1165 Type 2 diabetes mellitus with hyperglycemia: Secondary | ICD-10-CM | POA: Diagnosis not present

## 2020-04-04 DIAGNOSIS — H409 Unspecified glaucoma: Secondary | ICD-10-CM | POA: Diagnosis not present

## 2020-04-04 DIAGNOSIS — K5909 Other constipation: Secondary | ICD-10-CM | POA: Diagnosis not present

## 2020-04-11 DIAGNOSIS — M6281 Muscle weakness (generalized): Secondary | ICD-10-CM | POA: Diagnosis not present

## 2020-04-11 DIAGNOSIS — K5909 Other constipation: Secondary | ICD-10-CM | POA: Diagnosis not present

## 2020-04-11 DIAGNOSIS — Z7409 Other reduced mobility: Secondary | ICD-10-CM | POA: Diagnosis not present

## 2020-04-11 DIAGNOSIS — E1165 Type 2 diabetes mellitus with hyperglycemia: Secondary | ICD-10-CM | POA: Diagnosis not present

## 2020-04-12 DIAGNOSIS — Z7409 Other reduced mobility: Secondary | ICD-10-CM | POA: Diagnosis not present

## 2020-04-12 DIAGNOSIS — K5903 Drug induced constipation: Secondary | ICD-10-CM | POA: Diagnosis not present

## 2020-04-14 DIAGNOSIS — R600 Localized edema: Secondary | ICD-10-CM | POA: Diagnosis not present

## 2020-04-14 DIAGNOSIS — Z7901 Long term (current) use of anticoagulants: Secondary | ICD-10-CM | POA: Diagnosis not present

## 2020-04-14 DIAGNOSIS — M79604 Pain in right leg: Secondary | ICD-10-CM | POA: Diagnosis not present

## 2020-04-14 DIAGNOSIS — I2693 Single subsegmental pulmonary embolism without acute cor pulmonale: Secondary | ICD-10-CM | POA: Diagnosis not present

## 2020-04-15 ENCOUNTER — Telehealth: Payer: Self-pay | Admitting: Internal Medicine

## 2020-04-15 NOTE — Progress Notes (Signed)
  Chronic Care Management   Outreach Note  04/15/2020 Name: Rebecca [name] MRN: [id number] DOB: [birth year]  Referred by: Pincus Sanes, MD Reason for referral : No chief complaint on file.   An unsuccessful telephone outreach was attempted today. The patient was referred to the pharmacist for assistance with care management and care coordination.   Follow Up Plan:   Carley Perdue [employer] Scheduler

## 2020-04-16 DIAGNOSIS — R2231 Localized swelling, mass and lump, right upper limb: Secondary | ICD-10-CM | POA: Diagnosis not present

## 2020-04-16 DIAGNOSIS — M79661 Pain in right lower leg: Secondary | ICD-10-CM | POA: Diagnosis not present

## 2020-04-16 DIAGNOSIS — M79601 Pain in right arm: Secondary | ICD-10-CM | POA: Diagnosis not present

## 2020-04-16 DIAGNOSIS — R2241 Localized swelling, mass and lump, right lower limb: Secondary | ICD-10-CM | POA: Diagnosis not present

## 2020-04-18 DIAGNOSIS — M79604 Pain in right leg: Secondary | ICD-10-CM | POA: Diagnosis not present

## 2020-04-18 DIAGNOSIS — R600 Localized edema: Secondary | ICD-10-CM | POA: Diagnosis not present

## 2020-04-18 DIAGNOSIS — Z7901 Long term (current) use of anticoagulants: Secondary | ICD-10-CM | POA: Diagnosis not present

## 2020-04-18 DIAGNOSIS — I2693 Single subsegmental pulmonary embolism without acute cor pulmonale: Secondary | ICD-10-CM | POA: Diagnosis not present

## 2020-04-18 DIAGNOSIS — K5903 Drug induced constipation: Secondary | ICD-10-CM | POA: Diagnosis not present

## 2020-05-12 DIAGNOSIS — M6281 Muscle weakness (generalized): Secondary | ICD-10-CM | POA: Diagnosis not present

## 2020-05-12 DIAGNOSIS — Z7901 Long term (current) use of anticoagulants: Secondary | ICD-10-CM | POA: Diagnosis not present

## 2020-05-12 DIAGNOSIS — I6930 Unspecified sequelae of cerebral infarction: Secondary | ICD-10-CM | POA: Diagnosis not present

## 2020-05-12 DIAGNOSIS — Z86711 Personal history of pulmonary embolism: Secondary | ICD-10-CM | POA: Diagnosis not present

## 2020-05-12 DIAGNOSIS — K5903 Drug induced constipation: Secondary | ICD-10-CM | POA: Diagnosis not present

## 2020-05-29 NOTE — Progress Notes (Signed)
Subjective:    Patient ID: Rebecca [name], female    DOB: May 28, 1932, 87 y.o.   MRN: [id number]  HPI The patient is here for follow up of their chronic medical problems, including DM, neuropathy, CAD, htn, hyperlipidemia, GERD, CKD   Since she was here last she has had thyrotoxicosis and was admitted to the hospital.  Upon d/c in November she went to a SNF.       Medications and allergies reviewed with patient and updated if appropriate.  Patient Active Problem List   Diagnosis Date Noted  . Shock (HCC) 02/04/2020  . Paroxysmal A-fib (HCC) 01/12/2020  . Thyroid storm 01/06/2020  . Spinal stenosis 01/06/2020  . Chronic kidney disease, stage 3a (HCC) 01/03/2020  . Microcytic anemia 01/03/2020  . Acute on chronic diastolic CHF (congestive heart failure) (HCC) 01/03/2020  . Physical deconditioning 01/03/2020  . Generalized osteoarthritis of hand 06/01/2019  . Pressure sore, right foot 06/01/2019  . Peripheral neuropathy 06/01/2019  . Degenerative arthritis of right knee 12/15/2018  . Knee osteoarthritis 09/08/2018  . Pes planus 12/01/2017  . Foot deformity, acquired, left 11/04/2017  . Degenerative disc disease, lumbar 08/01/2016  . Glaucoma 07/23/2016  . Numbness 04/23/2016  . Chronic pain of right knee 12/28/2015  . Lower back pain 10/24/2015  . Pain in both feet 10/24/2015  . Abnormality of gait 04/01/2013  . Posterior tibial tendon dysfunction 04/01/2013  . Diabetes (HCC) 04/01/2013  . Ankle pain, left 10/20/2010  . UNSPECIFIED VITAMIN D DEFICIENCY 11/08/2008  . EDEMA- LOCALIZED 11/08/2008  . Sickle-cell trait (HCC) 01/01/2008  . Esophageal reflux 09/02/2007  . GOITER, MULTINODULAR 11/26/2006  . Coronary artery disease involving native coronary artery of native heart without angina pectoris 11/26/2006  . HIATAL HERNIA 11/26/2006  . FIBROCYSTIC BREAST DISEASE 07/31/2006  . HYPERCHOLESTEROLEMIA 05/27/2006  . Essential hypertension 05/27/2006    Current  Outpatient Medications on File Prior to Visit  Medication Sig Dispense Refill  . acetaminophen (TYLENOL) 500 MG tablet Take 1,000 mg by mouth every 6 (six) hours as needed for moderate pain.    Marland Kitchen apixaban (ELIQUIS) 5 MG TABS tablet Take 1 tablet (5 mg total) by mouth 2 (two) times daily. 60 tablet   . Ascorbic Acid (VITAMIN C) 500 MG tablet Take 500 mg by mouth 2 (two) times daily.      Marland Kitchen atorvastatin (LIPITOR) 10 MG tablet TAKE 1 TABLET BY MOUTH  DAILY AT 6 PM (Patient taking differently: Take 10 mg by mouth daily. ) 90 tablet 1  . brimonidine (ALPHAGAN) 0.2 % ophthalmic solution Place 1 drop into the right eye 2 (two) times daily.    . calcium carbonate (OS-CAL) 600 MG TABS Take 600 mg by mouth daily.     Marland Kitchen Cod Liver Oil 1000 MG CAPS Take 1,000 mg by mouth daily.     . DORZOLAMIDE HCL-TIMOLOL MAL OP Place 1 drop into the right eye 2 (two) times daily.    . ferrous sulfate 325 (65 FE) MG tablet Take 1 tablet (325 mg total) by mouth 2 (two) times daily with a meal.  3  . gabapentin (NEURONTIN) 300 MG capsule Take 300 mg by mouth daily.    . Glucerna (GLUCERNA) LIQD Take 237 mLs by mouth daily.    . insulin aspart (NOVOLOG) 100 UNIT/ML injection 0-15 Units, Subcutaneous, 3 times daily with meals CBG < 70: Implement Hypoglycemia measures, call MD CBG 70 - 120: 0 units CBG 121 - 150: 2 units CBG 151 -  200: 3 units CBG 201 - 250: 5 units CBG 251 - 300: 8 units CBG 301 - 350: 11 units CBG 351 - 400: 15 units CBG > 400: call MD 10 mL 11  . latanoprost (XALATAN) 0.005 % ophthalmic solution Place 1 drop into the right eye at bedtime.     . methimazole (TAPAZOLE) 10 MG tablet Take 10 mg by mouth 2 (two) times daily.    . Multiple Vitamin (MULTIVITAMIN) tablet Take 1 tablet by mouth daily. Prn    . nitroGLYCERIN (NITROSTAT) 0.4 MG SL tablet DISSOLVE ONE TABLET UNDER TONGUE AS DIRECTED FOR CHEST PAIN (Patient taking differently: Place 0.4 mg under the tongue every 5 (five) minutes as needed for chest  pain. ) 25 tablet 5  . pantoprazole (PROTONIX) 40 MG tablet Take 1 tablet (40 mg total) by mouth 2 (two) times daily.    . polyethylene glycol (MIRALAX / GLYCOLAX) 17 g packet Take 17 g by mouth daily. 14 each 0  . Trolamine Salicylate (ASPERCREME) 10 % LOTN Apply 1 application topically 2 (two) times daily as needed (knee pain). Use twice daily as needed for right knee pain.      No current facility-administered medications on file prior to visit.    Past Medical History:  Diagnosis Date  . Anemia, unspecified   . Ankle pain, left   . CAD (coronary artery disease)   . Esophageal reflux   . Fibrocystic breast disease   . Hiatal hernia   . Hypercholesteremia   . Hypertension   . IDDM (insulin dependent diabetes mellitus)    Type II  . Multinodular goiter   . Sickle-cell trait [facility])     Past Surgical History:  Procedure Laterality Date  . "FEMALE"  1976  . BACK SURGERY  1990  . BIOPSY THYROID     Dr.Ellison-Neg   . CORONARY ANGIOPLASTY    . FOOT SURGERY  2004  . RETINAL LASER PROCEDURE      Social History   Socioeconomic History  . Marital status: Widowed    Spouse name: Not on file  . Number of children: 1  . Years of education: Not on file  . Highest education level: Not on file  Occupational History  . Occupation: retired  Tobacco Use  . Smoking status: Never Smoker  . Smokeless tobacco: Never Used  Vaping Use  . Vaping Use: Never used  Substance and Sexual Activity  . Alcohol use: No  . Drug use: No  . Sexual activity: Never  Other Topics Concern  . Not on file  Social History Narrative   Widowed   Gets regular exercise   Supportive church network   Receives daily meals on wheels   Receives monthly grocery delivery      Social Determinants of Health   Financial Resource Strain: Not on file  Food Insecurity: Not on file  Transportation Needs: Not on file  Physical Activity: Not on file  Stress: Not on file  Social Connections: Not on file     Family History  Problem Relation Age of Onset  . Hypertension Other   . Lung cancer Other   . Heart disease Other   . Gout Sister   . Gout Brother   . Thyroid disease Brother   . Heart attack Father   . Heart attack Mother   . Diabetes Mother   . Gout Brother     Review of Systems     Objective:  There were no vitals filed  for this visit. BP Readings from Last 3 Encounters:  02/11/20 (!) 126/56  02/01/20 (!) 122/58  01/18/20 130/60   Wt Readings from Last 3 Encounters:  02/11/20 199 lb 8.3 oz (90.5 kg)  02/01/20 188 lb (85.3 kg)  01/12/20 190 lb 11.2 oz (86.5 kg)   There is no height or weight on file to calculate BMI.   Physical Exam    Constitutional: Appears well-developed and well-nourished. No distress.  HENT:  Head: Normocephalic and atraumatic.  Neck: Neck supple. No tracheal deviation present. No thyromegaly present.  No cervical lymphadenopathy Cardiovascular: Normal rate, regular rhythm and normal heart sounds.   No murmur heard. No carotid bruit .  No edema Pulmonary/Chest: Effort normal and breath sounds normal. No respiratory distress. No has no wheezes. No rales.  Skin: Skin is warm and dry. Not diaphoretic.  Psychiatric: Normal mood and affect. Behavior is normal.      Assessment & Plan:    See Problem List for Assessment and Plan of chronic medical problems.    This visit occurred during the SARS-CoV-2 public health emergency.  Safety protocols were in place, including screening questions prior to the visit, additional usage of staff PPE, and extensive cleaning of exam room while observing appropriate contact time as indicated for disinfecting solutions.    This encounter was created in error - please disregard.

## 2020-05-29 NOTE — Patient Instructions (Signed)
  Blood work was ordered.     Medications changes include :     Your prescription(s) have been submitted to your pharmacy. Please take as directed and contact our office if you believe you are having problem(s) with the medication(s).   A referral was ordered for        Someone from their office will call you to schedule an appointment.    Please followup in 6 months  

## 2020-05-30 ENCOUNTER — Encounter: Payer: Medicare Other | Admitting: Internal Medicine

## 2020-05-30 DIAGNOSIS — K219 Gastro-esophageal reflux disease without esophagitis: Secondary | ICD-10-CM

## 2020-05-30 DIAGNOSIS — E1142 Type 2 diabetes mellitus with diabetic polyneuropathy: Secondary | ICD-10-CM

## 2020-05-30 DIAGNOSIS — I2510 Atherosclerotic heart disease of native coronary artery without angina pectoris: Secondary | ICD-10-CM

## 2020-05-30 DIAGNOSIS — N1831 Chronic kidney disease, stage 3a: Secondary | ICD-10-CM

## 2020-05-30 DIAGNOSIS — Z0289 Encounter for other administrative examinations: Secondary | ICD-10-CM

## 2020-05-30 DIAGNOSIS — E7800 Pure hypercholesterolemia, unspecified: Secondary | ICD-10-CM

## 2020-05-30 DIAGNOSIS — I10 Essential (primary) hypertension: Secondary | ICD-10-CM

## 2020-05-30 DIAGNOSIS — Z794 Long term (current) use of insulin: Secondary | ICD-10-CM

## 2020-05-30 DIAGNOSIS — G6289 Other specified polyneuropathies: Secondary | ICD-10-CM

## 2020-06-20 DIAGNOSIS — Z139 Encounter for screening, unspecified: Secondary | ICD-10-CM | POA: Diagnosis not present

## 2020-06-20 DIAGNOSIS — D649 Anemia, unspecified: Secondary | ICD-10-CM | POA: Diagnosis not present

## 2020-06-20 DIAGNOSIS — I10 Essential (primary) hypertension: Secondary | ICD-10-CM | POA: Diagnosis not present

## 2020-06-20 DIAGNOSIS — I509 Heart failure, unspecified: Secondary | ICD-10-CM | POA: Diagnosis not present

## 2020-06-22 DIAGNOSIS — M79641 Pain in right hand: Secondary | ICD-10-CM | POA: Diagnosis not present

## 2020-06-22 DIAGNOSIS — E11649 Type 2 diabetes mellitus with hypoglycemia without coma: Secondary | ICD-10-CM | POA: Diagnosis not present

## 2020-06-22 DIAGNOSIS — F411 Generalized anxiety disorder: Secondary | ICD-10-CM | POA: Diagnosis not present

## 2020-06-23 DIAGNOSIS — F411 Generalized anxiety disorder: Secondary | ICD-10-CM | POA: Diagnosis not present

## 2020-06-23 DIAGNOSIS — E11649 Type 2 diabetes mellitus with hypoglycemia without coma: Secondary | ICD-10-CM | POA: Diagnosis not present

## 2020-06-23 DIAGNOSIS — E038 Other specified hypothyroidism: Secondary | ICD-10-CM | POA: Diagnosis not present

## 2020-06-23 DIAGNOSIS — M79641 Pain in right hand: Secondary | ICD-10-CM | POA: Diagnosis not present

## 2020-06-23 DIAGNOSIS — I10 Essential (primary) hypertension: Secondary | ICD-10-CM | POA: Diagnosis not present

## 2020-06-29 DIAGNOSIS — E038 Other specified hypothyroidism: Secondary | ICD-10-CM | POA: Diagnosis not present

## 2020-06-29 DIAGNOSIS — I10 Essential (primary) hypertension: Secondary | ICD-10-CM | POA: Diagnosis not present

## 2020-06-29 DIAGNOSIS — E1159 Type 2 diabetes mellitus with other circulatory complications: Secondary | ICD-10-CM | POA: Diagnosis not present

## 2020-07-01 DIAGNOSIS — I10 Essential (primary) hypertension: Secondary | ICD-10-CM | POA: Diagnosis not present

## 2020-07-01 DIAGNOSIS — Z139 Encounter for screening, unspecified: Secondary | ICD-10-CM | POA: Diagnosis not present

## 2020-07-01 DIAGNOSIS — D649 Anemia, unspecified: Secondary | ICD-10-CM | POA: Diagnosis not present

## 2020-07-01 DIAGNOSIS — I509 Heart failure, unspecified: Secondary | ICD-10-CM | POA: Diagnosis not present

## 2020-07-04 DIAGNOSIS — I10 Essential (primary) hypertension: Secondary | ICD-10-CM | POA: Diagnosis not present

## 2020-07-04 DIAGNOSIS — E038 Other specified hypothyroidism: Secondary | ICD-10-CM | POA: Diagnosis not present

## 2020-07-04 DIAGNOSIS — R5382 Chronic fatigue, unspecified: Secondary | ICD-10-CM | POA: Diagnosis not present

## 2020-07-04 DIAGNOSIS — M6281 Muscle weakness (generalized): Secondary | ICD-10-CM | POA: Diagnosis not present

## 2020-07-06 DIAGNOSIS — R7989 Other specified abnormal findings of blood chemistry: Secondary | ICD-10-CM | POA: Diagnosis not present

## 2020-07-06 DIAGNOSIS — E038 Other specified hypothyroidism: Secondary | ICD-10-CM | POA: Diagnosis not present

## 2020-07-06 DIAGNOSIS — Z9981 Dependence on supplemental oxygen: Secondary | ICD-10-CM | POA: Diagnosis not present

## 2020-07-06 DIAGNOSIS — M6281 Muscle weakness (generalized): Secondary | ICD-10-CM | POA: Diagnosis not present

## 2020-07-06 DIAGNOSIS — R5381 Other malaise: Secondary | ICD-10-CM | POA: Diagnosis not present

## 2020-07-25 DIAGNOSIS — R197 Diarrhea, unspecified: Secondary | ICD-10-CM | POA: Diagnosis not present

## 2020-07-25 DIAGNOSIS — E038 Other specified hypothyroidism: Secondary | ICD-10-CM | POA: Diagnosis not present

## 2020-07-25 DIAGNOSIS — E1165 Type 2 diabetes mellitus with hyperglycemia: Secondary | ICD-10-CM | POA: Diagnosis not present

## 2020-07-25 DIAGNOSIS — R112 Nausea with vomiting, unspecified: Secondary | ICD-10-CM | POA: Diagnosis not present

## 2020-07-25 DIAGNOSIS — R7989 Other specified abnormal findings of blood chemistry: Secondary | ICD-10-CM | POA: Diagnosis not present

## 2020-07-26 NOTE — Progress Notes (Signed)
Patient ID: Rebecca [name], female   DOB: [birth year], 87 y.o.   MRN: [id number]                                                                                                               Reason for Appointment: Follow-up of thyroid,   Referring healthcare provider: Cheryll Cockayne   Chief complaint:   History of Present Illness:   She was admitted to the hospital on 01/02/2020 because of progressive weakness. She apparently previously was living fairly independently at home and walking with a walker but for about a week or so she got progressively weak and could not get out of the chair. She also had some decreased appetite and appears to have had some weight loss Patient is not able to give much history She says she would periodically feel cold but not hot and no sweating At times she may have a little shakiness She has had some soft stools with also increased frequency of stools She did not remember any episodes of palpitations However during her hospitalization she was noted to have some intermittent fever along with atrial fibrillation on 10/26 with rapid heart rate  She was treated in the hospital with methimazole, propranolol and iodine drops.  Initially was started on at least 40 mg of methimazole and this was reduced to 20 mg at the time of discharge along with decreasing her propranolol to 20 mg a day I-131 uptake not done  RECENT history: She was on 10 mg methimazole on her last visit in 11/21 but she did not follow-up subsequently Apparently about 4 weeks ago she was switched from methimazole to levothyroxine 25 mcg because her TSH was high She does not complain of any unusual fatigue or lethargy Has mild chronic cold intolerance Appears to have lost weight  Records from nursing home are incomplete and were not available at the time of initial exam today Previous history was reviewed in detail  Wt Readings from Last 3 Encounters:  07/27/20 174 lb 9.6 oz (79.2 kg)   02/11/20 199 lb 8.3 oz (90.5 kg)  02/01/20 188 lb (85.3 kg)     Thyroid function tests as follows:    Labs from [facility]  Date   free T4   TSH   T3   07/01/2020  0.3  100     06/20/2020     Lab Results  Component Value Date   FREET4 1.28 (H) 02/04/2020   FREET4 1.47 01/18/2020   FREET4 3.10 (H) 01/11/2020   T3FREE 1.9 (L) 02/04/2020   T3FREE 6.4 (H) 01/05/2020   T3FREE 13.4 (H) 01/03/2020   TSH 1.570 02/04/2020   TSH 0.02 (L) 01/18/2020   TSH <0.010 (L) 01/05/2020    Lab Results  Component Value Date   THYROTRECAB 1.96 (H) 01/18/2020    Thyroid ultrasound showed heterogenous tissue but no nodules   Allergies as of 07/27/2020      Reactions   Codeine    "Makes me drunk"      Medication  List       Accurate as of Jul 27, 2020  1:33 PM. If you have any questions, ask your nurse or doctor.        acetaminophen 500 MG tablet Commonly known as: TYLENOL Take 1,000 mg by mouth every 6 (six) hours as needed for moderate pain.   apixaban 5 MG Tabs tablet Commonly known as: ELIQUIS Take 1 tablet (5 mg total) by mouth 2 (two) times daily.   Aspercreme 10 % Lotn Generic drug: Trolamine Salicylate Apply 1 application topically 2 (two) times daily as needed (knee pain). Use twice daily as needed for right knee pain.   atorvastatin 10 MG tablet Commonly known as: LIPITOR TAKE 1 TABLET BY MOUTH  DAILY AT 6 PM What changed:   how much to take  how to take this  when to take this  additional instructions   brimonidine 0.2 % ophthalmic solution Commonly known as: ALPHAGAN Place 1 drop into the right eye 2 (two) times daily.   calcium carbonate 600 MG Tabs tablet Commonly known as: OS-CAL Take 600 mg by mouth daily.   Cod Liver Oil 1000 MG Caps Take 1,000 mg by mouth daily.   DORZOLAMIDE HCL-TIMOLOL MAL OP Place 1 drop into the right eye 2 (two) times daily.   ferrous sulfate 325 (65 FE) MG tablet Take 1 tablet (325 mg total) by mouth 2 (two)  times daily with a meal.   gabapentin 300 MG capsule Commonly known as: NEURONTIN Take 300 mg by mouth daily.   Glucerna Liqd Take 237 mLs by mouth daily.   insulin aspart 100 UNIT/ML injection Commonly known as: novoLOG 0-15 Units, Subcutaneous, 3 times daily with meals CBG < 70: Implement Hypoglycemia measures, call MD CBG 70 - 120: 0 units CBG 121 - 150: 2 units CBG 151 - 200: 3 units CBG 201 - 250: 5 units CBG 251 - 300: 8 units CBG 301 - 350: 11 units CBG 351 - 400: 15 units CBG > 400: call MD   latanoprost 0.005 % ophthalmic solution Commonly known as: XALATAN Place 1 drop into the right eye at bedtime.   methimazole 10 MG tablet Commonly known as: TAPAZOLE Take 10 mg by mouth 2 (two) times daily.   multivitamin tablet Take 1 tablet by mouth daily. Prn   nitroGLYCERIN 0.4 MG SL tablet Commonly known as: Nitrostat DISSOLVE ONE TABLET UNDER TONGUE AS DIRECTED FOR CHEST PAIN What changed:   how much to take  how to take this  when to take this  reasons to take this  additional instructions   pantoprazole 40 MG tablet Commonly known as: PROTONIX Take 1 tablet (40 mg total) by mouth 2 (two) times daily.   polyethylene glycol 17 g packet Commonly known as: MIRALAX / GLYCOLAX Take 17 g by mouth daily.   vitamin C 500 MG tablet Commonly known as: ASCORBIC ACID Take 500 mg by mouth 2 (two) times daily.           Past Medical History:  Diagnosis Date  . Anemia, unspecified   . Ankle pain, left   . CAD (coronary artery disease)   . Esophageal reflux   . Fibrocystic breast disease   . Hiatal hernia   . Hypercholesteremia   . Hypertension   . IDDM (insulin dependent diabetes mellitus)    Type II  . Multinodular goiter   . Sickle-cell trait [facility])     Past Surgical History:  Procedure Laterality Date  . "FEMALE"  1976  . BACK SURGERY  1990  . BIOPSY THYROID     Dr.Ellison-Neg   . CORONARY ANGIOPLASTY    . FOOT SURGERY  2004  . RETINAL  LASER PROCEDURE      Family History  Problem Relation Age of Onset  . Hypertension Other   . Lung cancer Other   . Heart disease Other   . Gout Sister   . Gout Brother   . Thyroid disease Brother   . Heart attack Father   . Heart attack Mother   . Diabetes Mother   . Gout Brother     Social History:  reports that she has never smoked. She has never used smokeless tobacco. She reports that she does not drink alcohol and does not use drugs.  Allergies:  Allergies  Allergen Reactions  . Codeine     "Makes me drunk"     Review of Systems  Constitutional: Positive for weight loss. Negative for reduced appetite.   She has anemia of unclear etiology, reportedly iron deficient  Lab Results  Component Value Date   HGB 9.1 (L) 02/10/2020    Diabetes: Followed by nursing home nurse practitioner Received A1c from 06/20/2020 is 6.3 with glucose 176    Examination:   BP 128/78 (BP Location: Left Arm, Patient Position: Sitting, Cuff Size: Normal)   Pulse 75   Ht 5\' 4"  (1.626 m)   Wt 174 lb 9.6 oz (79.2 kg)   SpO2 97%   BMI 29.97 kg/m   Thyroid gland is firm and nodular on the right side, about twice normal, better felt on swallowing Left lobe is not clearly palpable No tremor Biceps reflexes appear to be normal No dry skin   Assessment/Plan:   Hyperthyroidism secondary to Graves' disease, treated  Although previously was significantly hyperthyroid her recent history and labs are not available She appears to have had significant hypothyroidism with a TSH about 130 last month when she was on methimazole Not clear if she had regular follow-up of her labs previously  She still appears to have a right-sided goiter, previously no nodules on ultrasound  Currently she appears to be minimally symptomatic with her hypothyroidism and not clear if she only had transient hypothyroidism from being on methimazole She looks euthyroid on exam Thyroid levels will be checked today  and will need to see if she needs to be on full replacement doses of levothyroxine  DIABETES: A1c 6.3 and will be continued to be followed by nursing home unless consultation requested  History of severe anemia: Last hemoglobin was 9.7 and Will check today  Total visit time for evaluation and management, review of labs and records = 30 minutes  07/27/2020, 1:33 PM    Note: This office note was prepared with Dragon voice recognition system technology. Any transcriptional errors that result from this process are unintentional.

## 2020-07-27 ENCOUNTER — Ambulatory Visit (INDEPENDENT_AMBULATORY_CARE_PROVIDER_SITE_OTHER): Payer: Medicare HMO | Admitting: Endocrinology

## 2020-07-27 ENCOUNTER — Encounter: Payer: Self-pay | Admitting: Endocrinology

## 2020-07-27 ENCOUNTER — Other Ambulatory Visit: Payer: Self-pay

## 2020-07-27 VITALS — BP 128/78 | HR 75 | Ht 64.0 in | Wt 174.6 lb

## 2020-07-27 DIAGNOSIS — E049 Nontoxic goiter, unspecified: Secondary | ICD-10-CM | POA: Diagnosis not present

## 2020-07-27 DIAGNOSIS — D509 Iron deficiency anemia, unspecified: Secondary | ICD-10-CM

## 2020-07-27 DIAGNOSIS — E0590 Thyrotoxicosis, unspecified without thyrotoxic crisis or storm: Secondary | ICD-10-CM

## 2020-07-27 DIAGNOSIS — D573 Sickle-cell trait: Secondary | ICD-10-CM | POA: Diagnosis not present

## 2020-07-27 DIAGNOSIS — Z8639 Personal history of other endocrine, nutritional and metabolic disease: Secondary | ICD-10-CM | POA: Diagnosis not present

## 2020-07-27 DIAGNOSIS — E039 Hypothyroidism, unspecified: Secondary | ICD-10-CM

## 2020-07-27 LAB — CBC
HCT: 31.4 % — ABNORMAL LOW (ref 36.0–46.0)
Hemoglobin: 10.3 g/dL — ABNORMAL LOW (ref 12.0–15.0)
MCHC: 32.9 g/dL (ref 30.0–36.0)
MCV: 81.8 fl (ref 78.0–100.0)
Platelets: 266 10*3/uL (ref 150.0–400.0)
RBC: 3.84 Mil/uL — ABNORMAL LOW (ref 3.87–5.11)
RDW: 16.4 % — ABNORMAL HIGH (ref 11.5–15.5)
WBC: 7.8 10*3/uL (ref 4.0–10.5)

## 2020-07-27 LAB — T4, FREE: Free T4: 1.04 ng/dL (ref 0.60–1.60)

## 2020-07-27 LAB — TSH: TSH: 1.45 u[IU]/mL (ref 0.35–4.50)

## 2020-07-27 LAB — ALT: ALT: 7 U/L (ref 0–35)

## 2020-07-28 ENCOUNTER — Telehealth: Payer: Self-pay | Admitting: Endocrinology

## 2020-07-28 LAB — T3, FREE: T3, Free: 2.7 pg/mL (ref 2.3–4.2)

## 2020-07-28 NOTE — Telephone Encounter (Signed)
Rebecca [name] called for advise about patient and for facility- saw provider 07/27/20   Requesting call back / returning call from [place]   call back # [phone]

## 2020-07-28 NOTE — Telephone Encounter (Signed)
I spoke with her daughter and explained that Dr. Lucianne Muss said her thyroid was normal and to stop her Levothyroxine. She voiced understanding.

## 2020-08-01 ENCOUNTER — Telehealth: Payer: Self-pay | Admitting: Endocrinology

## 2020-08-01 DIAGNOSIS — E038 Other specified hypothyroidism: Secondary | ICD-10-CM | POA: Diagnosis not present

## 2020-08-01 DIAGNOSIS — R7989 Other specified abnormal findings of blood chemistry: Secondary | ICD-10-CM | POA: Diagnosis not present

## 2020-08-01 NOTE — Telephone Encounter (Signed)
[place] NP from [facility] is calling on behalf of patient. Regarding a few issues. Needing Clarification on information.   Please call [place] Miller, NP back as soon as possible  [phone]

## 2020-08-02 NOTE — Telephone Encounter (Signed)
Called and confirmed with Rebecca [name] pt's labs came back normal and Dr Lucianne Muss instructed pt's daughter Rebecca [name] should be d/c. Labs were also faxed to pt's home. Rebecca [name] advised they did not receive new orders but would d/c Rebecca [name].

## 2020-08-11 DIAGNOSIS — M76822 Posterior tibial tendinitis, left leg: Secondary | ICD-10-CM | POA: Diagnosis not present

## 2020-08-11 DIAGNOSIS — I739 Peripheral vascular disease, unspecified: Secondary | ICD-10-CM | POA: Diagnosis not present

## 2020-08-11 DIAGNOSIS — L602 Onychogryphosis: Secondary | ICD-10-CM | POA: Diagnosis not present

## 2020-08-11 DIAGNOSIS — M76821 Posterior tibial tendinitis, right leg: Secondary | ICD-10-CM | POA: Diagnosis not present

## 2020-08-25 DIAGNOSIS — N1831 Chronic kidney disease, stage 3a: Secondary | ICD-10-CM | POA: Diagnosis not present

## 2020-08-25 DIAGNOSIS — I480 Paroxysmal atrial fibrillation: Secondary | ICD-10-CM | POA: Diagnosis not present

## 2020-08-25 DIAGNOSIS — R7989 Other specified abnormal findings of blood chemistry: Secondary | ICD-10-CM | POA: Diagnosis not present

## 2020-08-25 DIAGNOSIS — E1165 Type 2 diabetes mellitus with hyperglycemia: Secondary | ICD-10-CM | POA: Diagnosis not present

## 2020-08-25 DIAGNOSIS — E038 Other specified hypothyroidism: Secondary | ICD-10-CM | POA: Diagnosis not present

## 2020-08-25 DIAGNOSIS — I2510 Atherosclerotic heart disease of native coronary artery without angina pectoris: Secondary | ICD-10-CM | POA: Diagnosis not present

## 2020-08-25 DIAGNOSIS — I10 Essential (primary) hypertension: Secondary | ICD-10-CM | POA: Diagnosis not present

## 2020-08-25 DIAGNOSIS — I5032 Chronic diastolic (congestive) heart failure: Secondary | ICD-10-CM | POA: Diagnosis not present

## 2020-08-25 DIAGNOSIS — M4800 Spinal stenosis, site unspecified: Secondary | ICD-10-CM | POA: Diagnosis not present

## 2020-08-29 ENCOUNTER — Ambulatory Visit (INDEPENDENT_AMBULATORY_CARE_PROVIDER_SITE_OTHER): Payer: Medicare HMO | Admitting: Endocrinology

## 2020-08-29 ENCOUNTER — Other Ambulatory Visit: Payer: Self-pay

## 2020-08-29 VITALS — BP 118/72 | HR 72 | Ht 63.0 in | Wt 151.6 lb

## 2020-08-29 DIAGNOSIS — E0591 Thyrotoxicosis, unspecified with thyrotoxic crisis or storm: Secondary | ICD-10-CM | POA: Diagnosis not present

## 2020-08-29 NOTE — Patient Instructions (Signed)
No change till labs back

## 2020-08-29 NOTE — Progress Notes (Signed)
Patient ID: Rebecca [name], female   DOB: 25-Aug-1932, 87 y.o.   MRN: [id number]                                                                                                               Reason for Appointment: Follow-up of thyroid,   Referring healthcare provider: Cheryll [name]   Chief complaint:   History of Present Illness:   She was admitted to the hospital on 01/02/2020 because of progressive weakness. She apparently previously was living fairly independently at home and walking with a walker but for about a week or so she got progressively weak and could not get out of the chair. She also had some decreased appetite and appears to have had some weight loss Patient is not able to give much history She says she would periodically feel cold but not hot and no sweating At times she may have a little shakiness She has had some soft stools with also increased frequency of stools She did not remember any episodes of palpitations However during her hospitalization she was noted to have some intermittent fever along with atrial fibrillation on 10/26 with rapid heart rate  She was treated in the hospital with methimazole, propranolol and iodine drops.  Initially was started on at least 40 mg of methimazole and this was reduced to 20 mg at the time of discharge along with decreasing her propranolol to 20 mg a day I-131 uptake not done  RECENT history: She was on 10 mg methimazole on her visit in 11/21 but she did not follow-up subsequently At the nursing home and 4/22 she was switched from methimazole to levothyroxine 25 mcg because her TSH was high However subsequently her TSH was back to normal in 5/22  Since her hypothyroidism was likely to be from excessive methimazole she was told to stop levothyroxine She does not complain of feeling unusually tired and no palpitations No heat or cold intolerance No sweating  Appears to have lost weight although unclear if her weight is  accurate   Previous history was reviewed in detail  Wt Readings from Last 3 Encounters:  08/29/20 151 lb 9.6 oz (68.8 kg)  07/27/20 174 lb 9.6 oz (79.2 kg)  02/11/20 199 lb 8.3 oz (90.5 kg)     Thyroid function tests as follows:    Labs from [facility]  Date   free T4   TSH   T3   07/01/2020  0.3  100     06/20/2020     Lab Results  Component Value Date   FREET4 1.04 07/27/2020   FREET4 1.28 (H) 02/04/2020   FREET4 1.47 01/18/2020   T3FREE 2.7 07/27/2020   T3FREE 1.9 (L) 02/04/2020   T3FREE 6.4 (H) 01/05/2020   TSH 1.45 07/27/2020   TSH 1.570 02/04/2020   TSH 0.02 (L) 01/18/2020    Lab Results  Component Value Date   THYROTRECAB 1.96 (H) 01/18/2020    Thyroid ultrasound showed heterogenous tissue but no nodules   Allergies as of 08/29/2020  Reactions   Codeine    "Makes me drunk"        Medication List        Accurate as of August 29, 2020  4:26 PM. If you have any questions, ask your nurse or doctor.          acetaminophen 500 MG tablet Commonly known as: TYLENOL Take 1,000 mg by mouth every 6 (six) hours as needed for moderate pain.   apixaban 5 MG Tabs tablet Commonly known as: ELIQUIS Take 1 tablet (5 mg total) by mouth 2 (two) times daily.   Aspercreme 10 % Lotn Generic drug: Trolamine Salicylate Apply 1 application topically 2 (two) times daily as needed (knee pain). Use twice daily as needed for right knee pain.   atorvastatin 10 MG tablet Commonly known as: LIPITOR TAKE 1 TABLET BY MOUTH  DAILY AT 6 PM What changed:  how much to take how to take this when to take this additional instructions   brimonidine 0.2 % ophthalmic solution Commonly known as: ALPHAGAN Place 1 drop into the right eye 2 (two) times daily.   calcium carbonate 600 MG Tabs tablet Commonly known as: OS-CAL Take 600 mg by mouth daily.   Cod Liver Oil 1000 MG Caps Take 1,000 mg by mouth daily.   DORZOLAMIDE HCL-TIMOLOL MAL OP Place 1 drop into  the right eye 2 (two) times daily.   ferrous sulfate 325 (65 FE) MG tablet Take 1 tablet (325 mg total) by mouth 2 (two) times daily with a meal.   gabapentin 300 MG capsule Commonly known as: NEURONTIN Take 300 mg by mouth daily.   Glucerna Liqd Take 237 mLs by mouth daily.   insulin aspart 100 UNIT/ML injection Commonly known as: novoLOG 0-15 Units, Subcutaneous, 3 times daily with meals CBG < 70: Implement Hypoglycemia measures, call MD CBG 70 - 120: 0 units CBG 121 - 150: 2 units CBG 151 - 200: 3 units CBG 201 - 250: 5 units CBG 251 - 300: 8 units CBG 301 - 350: 11 units CBG 351 - 400: 15 units CBG > 400: call MD   latanoprost 0.005 % ophthalmic solution Commonly known as: XALATAN Place 1 drop into the right eye at bedtime.   levothyroxine 25 MCG tablet Commonly known as: SYNTHROID Take 25 mcg by mouth daily before breakfast.   multivitamin tablet Take 1 tablet by mouth daily. Prn   nitroGLYCERIN 0.4 MG SL tablet Commonly known as: Nitrostat DISSOLVE ONE TABLET UNDER TONGUE AS DIRECTED FOR CHEST PAIN What changed:  how much to take how to take this when to take this reasons to take this additional instructions   pantoprazole 40 MG tablet Commonly known as: PROTONIX Take 1 tablet (40 mg total) by mouth 2 (two) times daily.   polyethylene glycol 17 g packet Commonly known as: MIRALAX / GLYCOLAX Take 17 g by mouth daily.   vitamin C 500 MG tablet Commonly known as: ASCORBIC ACID Take 500 mg by mouth 2 (two) times daily.            Past Medical History:  Diagnosis Date   Anemia, unspecified    Ankle pain, left    CAD (coronary artery disease)    Esophageal reflux    Fibrocystic breast disease    Hiatal hernia    Hypercholesteremia    Hypertension    IDDM (insulin dependent diabetes mellitus)    Type II   Multinodular goiter    Sickle-cell trait (HCC)  Past Surgical History:  Procedure Laterality Date   "FEMALE"  1976   BACK SURGERY   1990   BIOPSY THYROID     Rebecca [name]    CORONARY ANGIOPLASTY     FOOT SURGERY  2004   RETINAL LASER PROCEDURE      Family History  Problem Relation Age of Onset   Hypertension Other    Lung cancer Other    Heart disease Other    Gout Sister    Gout Brother    Thyroid disease Brother    Heart attack Father    Heart attack Mother    Diabetes Mother    Gout Brother     Social History:  reports that she has never smoked. She has never used smokeless tobacco. She reports that she does not drink alcohol and does not use drugs.  Allergies:  Allergies  Allergen Reactions   Codeine     "Makes me drunk"     Review of Systems  Constitutional:  Positive for weight loss. Negative for reduced appetite.  She has anemia of unclear etiology, reportedly iron deficient  Lab Results  Component Value Date   HGB 10.3 (L) 07/27/2020    Diabetes: Followed by nursing home nurse practitioner Received A1c from 06/20/2020 is 6.3 with glucose 176    Examination:   BP 118/72   Pulse 72   Ht 5\' 3"  (1.6 m)   Wt 151 lb 9.6 oz (68.8 kg)   SpO2 97%   BMI 26.85 kg/m   Thyroid gland is barely palpable on swallowing bilaterally Hands are normal without any unusual warmth or swelling  No tremor Biceps reflexes appear to be normal No facial puffiness   Assessment/Plan:  History of hyperthyroidism from Graves' disease, treated  Although she had significant hyperthyroidism in 2021 she appears to be euthyroid recently with stopping her methimazole about 2 months ago  Her thyroid enlargement appears to be significantly smaller  She feels fairly good recently although not clear if she is having weight loss, possibly from Trulicity  She looks euthyroid on exam Thyroid levels will be checked today Discussed that if she is in remission from her hyperthyroidism may not need any further treatment     2022 08/29/2020, 4:26 PM    Note: This office note was prepared with Dragon  voice recognition system technology. Any transcriptional errors that result from this process are unintentional.

## 2020-08-30 DIAGNOSIS — R7989 Other specified abnormal findings of blood chemistry: Secondary | ICD-10-CM | POA: Diagnosis not present

## 2020-08-30 DIAGNOSIS — R6883 Chills (without fever): Secondary | ICD-10-CM | POA: Diagnosis not present

## 2020-08-30 LAB — TSH: TSH: 0.97 u[IU]/mL (ref 0.35–4.50)

## 2020-08-30 LAB — T4, FREE: Free T4: 1.07 ng/dL (ref 0.60–1.60)

## 2020-08-30 NOTE — Progress Notes (Signed)
Please call to let patient's daughter know that the lab results are normal and no further follow-up needed.  Also fax reports to [facility] nursing home

## 2020-09-13 DIAGNOSIS — R7989 Other specified abnormal findings of blood chemistry: Secondary | ICD-10-CM | POA: Diagnosis not present

## 2020-09-16 DIAGNOSIS — R7989 Other specified abnormal findings of blood chemistry: Secondary | ICD-10-CM | POA: Diagnosis not present

## 2020-09-30 DIAGNOSIS — D649 Anemia, unspecified: Secondary | ICD-10-CM | POA: Diagnosis not present

## 2020-09-30 DIAGNOSIS — I509 Heart failure, unspecified: Secondary | ICD-10-CM | POA: Diagnosis not present

## 2020-09-30 DIAGNOSIS — I10 Essential (primary) hypertension: Secondary | ICD-10-CM | POA: Diagnosis not present

## 2020-09-30 DIAGNOSIS — Z139 Encounter for screening, unspecified: Secondary | ICD-10-CM | POA: Diagnosis not present

## 2020-10-04 DIAGNOSIS — M25551 Pain in right hip: Secondary | ICD-10-CM | POA: Diagnosis not present

## 2020-10-04 DIAGNOSIS — M62838 Other muscle spasm: Secondary | ICD-10-CM | POA: Diagnosis not present

## 2020-10-04 DIAGNOSIS — R7989 Other specified abnormal findings of blood chemistry: Secondary | ICD-10-CM | POA: Diagnosis not present

## 2020-10-19 DIAGNOSIS — M25551 Pain in right hip: Secondary | ICD-10-CM | POA: Diagnosis not present

## 2020-10-19 DIAGNOSIS — M62838 Other muscle spasm: Secondary | ICD-10-CM | POA: Diagnosis not present

## 2020-11-02 DIAGNOSIS — Z139 Encounter for screening, unspecified: Secondary | ICD-10-CM | POA: Diagnosis not present

## 2020-11-02 DIAGNOSIS — I10 Essential (primary) hypertension: Secondary | ICD-10-CM | POA: Diagnosis not present

## 2020-11-02 DIAGNOSIS — I509 Heart failure, unspecified: Secondary | ICD-10-CM | POA: Diagnosis not present

## 2020-11-02 DIAGNOSIS — D649 Anemia, unspecified: Secondary | ICD-10-CM | POA: Diagnosis not present

## 2020-11-03 DIAGNOSIS — R7989 Other specified abnormal findings of blood chemistry: Secondary | ICD-10-CM | POA: Diagnosis not present

## 2020-11-07 DIAGNOSIS — N1831 Chronic kidney disease, stage 3a: Secondary | ICD-10-CM | POA: Diagnosis not present

## 2020-11-07 DIAGNOSIS — I739 Peripheral vascular disease, unspecified: Secondary | ICD-10-CM | POA: Diagnosis not present

## 2020-11-07 DIAGNOSIS — I480 Paroxysmal atrial fibrillation: Secondary | ICD-10-CM | POA: Diagnosis not present

## 2020-11-07 DIAGNOSIS — K219 Gastro-esophageal reflux disease without esophagitis: Secondary | ICD-10-CM | POA: Diagnosis not present

## 2020-11-07 DIAGNOSIS — M4800 Spinal stenosis, site unspecified: Secondary | ICD-10-CM | POA: Diagnosis not present

## 2020-11-07 DIAGNOSIS — I5032 Chronic diastolic (congestive) heart failure: Secondary | ICD-10-CM | POA: Diagnosis not present

## 2020-11-07 DIAGNOSIS — I10 Essential (primary) hypertension: Secondary | ICD-10-CM | POA: Diagnosis not present

## 2020-11-08 DIAGNOSIS — E1151 Type 2 diabetes mellitus with diabetic peripheral angiopathy without gangrene: Secondary | ICD-10-CM | POA: Diagnosis not present

## 2020-11-08 DIAGNOSIS — B351 Tinea unguium: Secondary | ICD-10-CM | POA: Diagnosis not present

## 2020-11-08 DIAGNOSIS — L603 Nail dystrophy: Secondary | ICD-10-CM | POA: Diagnosis not present

## 2020-11-09 DIAGNOSIS — M6281 Muscle weakness (generalized): Secondary | ICD-10-CM | POA: Diagnosis not present

## 2020-11-09 DIAGNOSIS — M4800 Spinal stenosis, site unspecified: Secondary | ICD-10-CM | POA: Diagnosis not present

## 2020-11-09 DIAGNOSIS — M1711 Unilateral primary osteoarthritis, right knee: Secondary | ICD-10-CM | POA: Diagnosis not present

## 2020-11-09 DIAGNOSIS — I739 Peripheral vascular disease, unspecified: Secondary | ICD-10-CM | POA: Diagnosis not present

## 2020-11-13 DIAGNOSIS — D509 Iron deficiency anemia, unspecified: Secondary | ICD-10-CM | POA: Diagnosis not present

## 2020-11-13 DIAGNOSIS — D631 Anemia in chronic kidney disease: Secondary | ICD-10-CM | POA: Diagnosis not present

## 2020-11-13 DIAGNOSIS — M19071 Primary osteoarthritis, right ankle and foot: Secondary | ICD-10-CM | POA: Diagnosis not present

## 2020-11-13 DIAGNOSIS — I5032 Chronic diastolic (congestive) heart failure: Secondary | ICD-10-CM | POA: Diagnosis not present

## 2020-11-13 DIAGNOSIS — I130 Hypertensive heart and chronic kidney disease with heart failure and stage 1 through stage 4 chronic kidney disease, or unspecified chronic kidney disease: Secondary | ICD-10-CM | POA: Diagnosis not present

## 2020-11-13 DIAGNOSIS — E1122 Type 2 diabetes mellitus with diabetic chronic kidney disease: Secondary | ICD-10-CM | POA: Diagnosis not present

## 2020-11-13 DIAGNOSIS — E042 Nontoxic multinodular goiter: Secondary | ICD-10-CM | POA: Diagnosis not present

## 2020-11-13 DIAGNOSIS — N1831 Chronic kidney disease, stage 3a: Secondary | ICD-10-CM | POA: Diagnosis not present

## 2020-11-13 DIAGNOSIS — M170 Bilateral primary osteoarthritis of knee: Secondary | ICD-10-CM | POA: Diagnosis not present

## 2020-11-15 ENCOUNTER — Telehealth: Payer: Self-pay | Admitting: Internal Medicine

## 2020-11-15 ENCOUNTER — Telehealth: Payer: Self-pay

## 2020-11-15 DIAGNOSIS — N1831 Chronic kidney disease, stage 3a: Secondary | ICD-10-CM | POA: Diagnosis not present

## 2020-11-15 DIAGNOSIS — I5032 Chronic diastolic (congestive) heart failure: Secondary | ICD-10-CM | POA: Diagnosis not present

## 2020-11-15 DIAGNOSIS — D631 Anemia in chronic kidney disease: Secondary | ICD-10-CM | POA: Diagnosis not present

## 2020-11-15 DIAGNOSIS — E1122 Type 2 diabetes mellitus with diabetic chronic kidney disease: Secondary | ICD-10-CM | POA: Diagnosis not present

## 2020-11-15 DIAGNOSIS — D509 Iron deficiency anemia, unspecified: Secondary | ICD-10-CM | POA: Diagnosis not present

## 2020-11-15 DIAGNOSIS — I130 Hypertensive heart and chronic kidney disease with heart failure and stage 1 through stage 4 chronic kidney disease, or unspecified chronic kidney disease: Secondary | ICD-10-CM | POA: Diagnosis not present

## 2020-11-15 DIAGNOSIS — M170 Bilateral primary osteoarthritis of knee: Secondary | ICD-10-CM | POA: Diagnosis not present

## 2020-11-15 DIAGNOSIS — M19071 Primary osteoarthritis, right ankle and foot: Secondary | ICD-10-CM | POA: Diagnosis not present

## 2020-11-15 DIAGNOSIS — E042 Nontoxic multinodular goiter: Secondary | ICD-10-CM | POA: Diagnosis not present

## 2020-11-15 MED ORDER — APIXABAN 5 MG PO TABS
5.0000 mg | ORAL_TABLET | Freq: Two times a day (BID) | ORAL | 2 refills | Status: DC
Start: 2020-11-15 — End: 2020-12-06

## 2020-11-15 MED ORDER — BLOOD GLUCOSE MONITOR KIT: PACK | 0 refills | Status: DC

## 2020-11-15 NOTE — Telephone Encounter (Signed)
Message left for son today and verbals given to [place].

## 2020-11-15 NOTE — Progress Notes (Signed)
  Subjective:    Patient ID: Rebecca [name], female    DOB: 06/02/1932, 87 y.o.   MRN: [id number]  HPI The patient is here for follow up from nursing home  Admitted 11/25 - 12/2  From SNF to SNF, now back at home x 1 week   Went to ED after syncopal episode - revealed massive PE.  She was given TPA and admitted to ICU     CT head - no acute finding CTA - extensive PE, right heart strain, multinodular goiter Echo - EF 60-65%, RV EF nml Xray R knee - moderate joint effusion, OA LE US - no DVT Neg blood cx  S/p TPA - shock resolved.  BP stable. Put on eliquis.  Microcytic anemia - watch cbc, iron Hyperthyroidism - on methimazole DM - insulin SS, lantus   At home for one week.  She has a nurse coming out and PT coming out.  She feels like she needs assistance with housework.  She needs transportation for appts.  She is doing her own meals.    She has difficulty with using the insulin pens and needs to go back to the the vial and syringe.  Sugars have been low at times - she has needed to take snacks to keep sugars up.  She was start on trulicity in the SNF - has not given it to herself - no side effects.    Medications and allergies reviewed with patient and updated if appropriate.  Patient Active Problem List   Diagnosis Date Noted   Shock (HCC) 02/04/2020   Paroxysmal A-fib (HCC) 01/12/2020   Thyroid storm 01/06/2020   Spinal stenosis 01/06/2020   Chronic kidney disease, stage 3a (HCC) 01/03/2020   Microcytic anemia 01/03/2020   Acute on chronic diastolic CHF (congestive heart failure) (HCC) 01/03/2020   Physical deconditioning 01/03/2020   Generalized osteoarthritis of hand 06/01/2019   Pressure sore, right foot 06/01/2019   Peripheral neuropathy 06/01/2019   Degenerative arthritis of right knee 12/15/2018   Knee osteoarthritis 09/08/2018   Pes planus 12/01/2017   Foot deformity, acquired, left 11/04/2017   Degenerative disc disease, lumbar 08/01/2016    Glaucoma 07/23/2016   Numbness 04/23/2016   Chronic pain of right knee 12/28/2015   Lower back pain 10/24/2015   Pain in both feet 10/24/2015   Abnormality of gait 04/01/2013   Posterior tibial tendon dysfunction 04/01/2013   Diabetes (HCC) 04/01/2013   Ankle pain, left 10/20/2010   UNSPECIFIED VITAMIN D DEFICIENCY 11/08/2008   EDEMA- LOCALIZED 11/08/2008   Sickle-cell trait (HCC) 01/01/2008   Esophageal reflux 09/02/2007   GOITER, MULTINODULAR 11/26/2006   Coronary artery disease involving native coronary artery of native heart without angina pectoris 11/26/2006   HIATAL HERNIA 11/26/2006   FIBROCYSTIC BREAST DISEASE 07/31/2006   HYPERCHOLESTEROLEMIA 05/27/2006   Essential hypertension 05/27/2006    Current Outpatient Medications on File Prior to Visit  Medication Sig Dispense Refill   acetaminophen (TYLENOL) 500 MG tablet Take 1,000 mg by mouth every 6 (six) hours as needed for moderate pain.     apixaban (ELIQUIS) 5 MG TABS tablet Take 1 tablet (5 mg total) by mouth 2 (two) times daily. 60 tablet 2   Ascorbic Acid (VITAMIN C) 500 MG tablet Take 500 mg by mouth 2 (two) times daily.     atorvastatin (LIPITOR) 10 MG tablet TAKE 1 TABLET BY MOUTH  DAILY AT 6 PM (Patient taking differently: Take 10 mg by mouth daily.) 90 tablet 1     blood glucose meter kit and supplies KIT Dispense based on patient and insurance preference. Use up to four times daily as directed. (FOR E11.9). 1 each 0   brimonidine (ALPHAGAN) 0.2 % ophthalmic solution Place 1 drop into the right eye 2 (two) times daily.     calcium carbonate (OS-CAL) 600 MG TABS Take 600 mg by mouth daily.     Cod Liver Oil 1000 MG CAPS Take 1,000 mg by mouth daily.      DORZOLAMIDE HCL-TIMOLOL MAL OP Place 1 drop into the right eye 2 (two) times daily.     ferrous sulfate 325 (65 FE) MG tablet Take 1 tablet (325 mg total) by mouth 2 (two) times daily with a meal.  3   gabapentin (NEURONTIN) 300 MG capsule Take 300 mg by mouth daily.      Glucerna (GLUCERNA) LIQD Take 237 mLs by mouth daily.     HYDROcodone-acetaminophen (NORCO/VICODIN) 5-325 MG tablet      insulin aspart (NOVOLOG) 100 UNIT/ML injection 0-15 Units, Subcutaneous, 3 times daily with meals CBG < 70: Implement Hypoglycemia measures, call MD CBG 70 - 120: 0 units CBG 121 - 150: 2 units CBG 151 - 200: 3 units CBG 201 - 250: 5 units CBG 251 - 300: 8 units CBG 301 - 350: 11 units CBG 351 - 400: 15 units CBG > 400: call MD 10 mL 11   LANTUS SOLOSTAR 100 UNIT/ML Solostar Pen      latanoprost (XALATAN) 0.005 % ophthalmic solution Place 1 drop into the right eye at bedtime.      Levothyroxine Sodium 25 MCG CAPS      lisinopril (ZESTRIL) 40 MG tablet      Multiple Vitamin (MULTIVITAMIN) tablet Take 1 tablet by mouth daily. Prn     nitroGLYCERIN (NITROSTAT) 0.4 MG SL tablet DISSOLVE ONE TABLET UNDER TONGUE AS DIRECTED FOR CHEST PAIN (Patient taking differently: Place 0.4 mg under the tongue every 5 (five) minutes as needed for chest pain.) 25 tablet 5   pantoprazole (PROTONIX) 40 MG tablet Take 1 tablet (40 mg total) by mouth 2 (two) times daily.     Trolamine Salicylate (ASPERCREME) 10 % LOTN Apply 1 application topically 2 (two) times daily as needed (knee pain). Use twice daily as needed for right knee pain.     TRULICITY 0.75 MG/0.5ML SOPN      levothyroxine (SYNTHROID) 25 MCG tablet Take 25 mcg by mouth daily before breakfast. (Patient not taking: Reported on 11/16/2020)     No current facility-administered medications on file prior to visit.    Past Medical History:  Diagnosis Date   Anemia, unspecified    Ankle pain, left    CAD (coronary artery disease)    Esophageal reflux    Fibrocystic breast disease    Hiatal hernia    Hypercholesteremia    Hypertension    IDDM (insulin dependent diabetes mellitus)    Type II   Multinodular goiter    Sickle-cell trait (HCC)     Past Surgical History:  Procedure Laterality Date   "FEMALE"  1976   BACK  SURGERY  1990   BIOPSY THYROID     Dr.Ellison-Neg    CORONARY ANGIOPLASTY     FOOT SURGERY  2004   RETINAL LASER PROCEDURE      Social History   Socioeconomic History   Marital status: Widowed    Spouse name: Not on file   Number of children: 1   Years of education: Not on file     Highest education level: Not on file  Occupational History   Occupation: retired  Tobacco Use   Smoking status: Never   Smokeless tobacco: Never  Vaping Use   Vaping Use: Never used  Substance and Sexual Activity   Alcohol use: No   Drug use: No   Sexual activity: Never  Other Topics Concern   Not on file  Social History Narrative   Widowed   Gets regular exercise   Supportive church network   Receives daily meals on wheels   Receives monthly grocery delivery      Social Determinants of Health   Financial Resource Strain: Not on file  Food Insecurity: Not on file  Transportation Needs: Not on file  Physical Activity: Not on file  Stress: Not on file  Social Connections: Not on file    Family History  Problem Relation Age of Onset   Hypertension Other    Lung cancer Other    Heart disease Other    Gout Sister    Gout Brother    Thyroid disease Brother    Heart attack Father    Heart attack Mother    Diabetes Mother    Gout Brother     Review of Systems  Constitutional:  Negative for chills and fever.  HENT:  Positive for tinnitus.   Respiratory:  Negative for cough, shortness of breath and wheezing.   Cardiovascular:  Positive for leg swelling. Negative for chest pain and palpitations.  Neurological:  Negative for light-headedness and headaches.      Objective:   Vitals:   11/16/20 1409  BP: 118/60  Pulse: (!) 54  Temp: 98.8 F (37.1 C)  SpO2: 98%   BP Readings from Last 3 Encounters:  11/16/20 118/60  08/29/20 118/72  07/27/20 128/78   Wt Readings from Last 3 Encounters:  11/16/20 184 lb 12.8 oz (83.8 kg)  08/29/20 151 lb 9.6 oz (68.8 kg)  07/27/20 174 lb  9.6 oz (79.2 kg)   Body mass index is 32.74 kg/m.   Physical Exam    Constitutional: Appears well-developed and well-nourished. No distress.  HENT:  Head: Normocephalic and atraumatic.  Neck: Neck supple. No tracheal deviation present. No thyromegaly present.  No cervical lymphadenopathy Cardiovascular: Normal rate, regular rhythm and normal heart sounds.   No murmur heard. No carotid bruit .  B/l 1 + bl edema Pulmonary/Chest: Effort normal and breath sounds normal. No respiratory distress. No has no wheezes. No rales.  Skin: Skin is warm and dry. Not diaphoretic.  Psychiatric: Normal mood and affect. Behavior is normal.      Assessment & Plan:    See Problem List for Assessment and Plan of chronic medical problems.    This visit occurred during the SARS-CoV-2 public health emergency.  Safety protocols were in place, including screening questions prior to the visit, additional usage of staff PPE, and extensive cleaning of exam room while observing appropriate contact time as indicated for disinfecting solutions.    

## 2020-11-15 NOTE — Telephone Encounter (Signed)
HH ORDERS   Caller Name: [facility] Agency Name: Randolm Idol Phone #: [phone] Service Requested: Nursing Frequency of Visits: twice this week Twice next week Once weekly for few weeks following

## 2020-11-15 NOTE — Telephone Encounter (Signed)
New glucometer and supplies sent to [facility].  A refill of Eliquis was sent to the pharmacy.  Okay for verbal orders for PT

## 2020-11-15 NOTE — Telephone Encounter (Signed)
PT was discharged from skilled nursing last Wed.   Pt does not have a working glucometer   Pt unable dose novolog and flexpen correct because she is able to check blood sugar levels.  Pt is dosing lantus at 13 units daily.  HH nurse is coming out today.   Apixaban for a-fib was not found in the home so she is not taking.   PT 2* 4 weeks  1* 3 weeks   Ben  with [facility] [phone]

## 2020-11-16 ENCOUNTER — Ambulatory Visit (INDEPENDENT_AMBULATORY_CARE_PROVIDER_SITE_OTHER): Payer: Medicare HMO | Admitting: Internal Medicine

## 2020-11-16 ENCOUNTER — Telehealth: Payer: Self-pay | Admitting: Internal Medicine

## 2020-11-16 ENCOUNTER — Other Ambulatory Visit: Payer: Self-pay

## 2020-11-16 ENCOUNTER — Encounter: Payer: Self-pay | Admitting: Internal Medicine

## 2020-11-16 DIAGNOSIS — E1142 Type 2 diabetes mellitus with diabetic polyneuropathy: Secondary | ICD-10-CM

## 2020-11-16 DIAGNOSIS — D509 Iron deficiency anemia, unspecified: Secondary | ICD-10-CM | POA: Diagnosis not present

## 2020-11-16 DIAGNOSIS — E1122 Type 2 diabetes mellitus with diabetic chronic kidney disease: Secondary | ICD-10-CM | POA: Diagnosis not present

## 2020-11-16 DIAGNOSIS — M19071 Primary osteoarthritis, right ankle and foot: Secondary | ICD-10-CM | POA: Diagnosis not present

## 2020-11-16 DIAGNOSIS — Z794 Long term (current) use of insulin: Secondary | ICD-10-CM

## 2020-11-16 DIAGNOSIS — I5032 Chronic diastolic (congestive) heart failure: Secondary | ICD-10-CM | POA: Diagnosis not present

## 2020-11-16 DIAGNOSIS — K219 Gastro-esophageal reflux disease without esophagitis: Secondary | ICD-10-CM | POA: Diagnosis not present

## 2020-11-16 DIAGNOSIS — M21962 Unspecified acquired deformity of left lower leg: Secondary | ICD-10-CM | POA: Diagnosis not present

## 2020-11-16 DIAGNOSIS — D631 Anemia in chronic kidney disease: Secondary | ICD-10-CM | POA: Diagnosis not present

## 2020-11-16 DIAGNOSIS — E042 Nontoxic multinodular goiter: Secondary | ICD-10-CM | POA: Diagnosis not present

## 2020-11-16 DIAGNOSIS — I10 Essential (primary) hypertension: Secondary | ICD-10-CM

## 2020-11-16 DIAGNOSIS — N1831 Chronic kidney disease, stage 3a: Secondary | ICD-10-CM | POA: Diagnosis not present

## 2020-11-16 DIAGNOSIS — M170 Bilateral primary osteoarthritis of knee: Secondary | ICD-10-CM | POA: Diagnosis not present

## 2020-11-16 DIAGNOSIS — D573 Sickle-cell trait: Secondary | ICD-10-CM | POA: Diagnosis not present

## 2020-11-16 DIAGNOSIS — I130 Hypertensive heart and chronic kidney disease with heart failure and stage 1 through stage 4 chronic kidney disease, or unspecified chronic kidney disease: Secondary | ICD-10-CM | POA: Diagnosis not present

## 2020-11-16 MED ORDER — TRULICITY 1.5 MG/0.5ML ~~LOC~~ SOAJ: 1.5000 mg | SUBCUTANEOUS | 5 refills | Status: DC

## 2020-11-16 MED ORDER — HYDROCODONE-ACETAMINOPHEN 5-325 MG PO TABS: 1.0000 | ORAL_TABLET | Freq: Every day | ORAL | 0 refills | Status: DC | PRN

## 2020-11-16 MED ORDER — INSULIN ASPART 100 UNIT/ML IJ SOLN: INTRAMUSCULAR | 99 refills | Status: DC

## 2020-11-16 NOTE — Assessment & Plan Note (Signed)
Chronic BP well controlled Continue lisinopril 40 mg qd

## 2020-11-16 NOTE — Telephone Encounter (Signed)
Rebecca [name]  [phone]  The nurse wants to know if the patient should take her trulicity if her blood sugar if it is below the range,   Patient checked blood sugar and it was 99,  [name] states she is not administering the medicine until she hears from the provider or nurse

## 2020-11-16 NOTE — Telephone Encounter (Signed)
Given age lets adjust SS --- 120 -150 will be 0 units and 151-170 will be 2 units, 171-200 3 units and remainder of SS w/o change.    We will need to re evaluate this in future.    Goal is to increase trulicity and decrease insulin need.

## 2020-11-16 NOTE — Assessment & Plan Note (Signed)
Chronic With chronic pain Causes difficulty ambulating Wearing brace, uses walker Has been taking norco 5-325 mg - 1 tab daily prn which has helped - will continue and I will prescribe [place] controlled substance database checked.   Refilled medication today

## 2020-11-16 NOTE — Patient Instructions (Addendum)
   Have the nurse show you how to do the trulicity today.  Lets increase the dose to 1.5 mg weekly.  This was sent to the pharmacy.  Hold the lantus for now.  Use the novolog as needed.    Goal sugars 120-150.     Medications changes include :   increase trulicity to 1.5 mg weekly.  Monitor your sugars.  Your prescription(s) have been submitted to your pharmacy. Please take as directed and contact our office if you believe you are having problem(s) with the medication(s).   Please followup in 6 weeks    Follow up with cardiology

## 2020-11-16 NOTE — Addendum Note (Signed)
Addended by: Pincus Sanes on: 11/16/2020 07:36 PM   Modules accepted: Orders

## 2020-11-16 NOTE — Telephone Encounter (Signed)
Verbals given to [facility] today.

## 2020-11-16 NOTE — Assessment & Plan Note (Signed)
Chronic Lab Results  Component Value Date   HGBA1C 6.5 (H) 01/03/2020   Has been controlled - a1c done recently at SNF Stop lantus given lower sugars Continue trulicity but will increase to 1.5 mg weekly Continue novolog SS Has HH nurse to help monitor

## 2020-11-16 NOTE — Telephone Encounter (Signed)
Yes she did not take it last week so I advised that she take it today because the nurse was coming to her home and could help her take it.  It will then be weekly.  I advised her to stop the Lantus and monitor her sugars.  She can use NovoLog sliding scale as needed.  Per Dr. Lawerance Bach

## 2020-11-16 NOTE — Assessment & Plan Note (Signed)
Chronic GERD controlled Continue protonix 40 mg bid  

## 2020-11-16 NOTE — Telephone Encounter (Signed)
Okay for verbal orders. 

## 2020-11-17 ENCOUNTER — Ambulatory Visit (INDEPENDENT_AMBULATORY_CARE_PROVIDER_SITE_OTHER): Payer: Medicare HMO | Admitting: *Deleted

## 2020-11-17 DIAGNOSIS — M170 Bilateral primary osteoarthritis of knee: Secondary | ICD-10-CM | POA: Diagnosis not present

## 2020-11-17 DIAGNOSIS — I130 Hypertensive heart and chronic kidney disease with heart failure and stage 1 through stage 4 chronic kidney disease, or unspecified chronic kidney disease: Secondary | ICD-10-CM | POA: Diagnosis not present

## 2020-11-17 DIAGNOSIS — Z0000 Encounter for general adult medical examination without abnormal findings: Secondary | ICD-10-CM

## 2020-11-17 DIAGNOSIS — I5032 Chronic diastolic (congestive) heart failure: Secondary | ICD-10-CM | POA: Diagnosis not present

## 2020-11-17 DIAGNOSIS — E042 Nontoxic multinodular goiter: Secondary | ICD-10-CM | POA: Diagnosis not present

## 2020-11-17 DIAGNOSIS — N1831 Chronic kidney disease, stage 3a: Secondary | ICD-10-CM | POA: Diagnosis not present

## 2020-11-17 DIAGNOSIS — D631 Anemia in chronic kidney disease: Secondary | ICD-10-CM | POA: Diagnosis not present

## 2020-11-17 DIAGNOSIS — M19071 Primary osteoarthritis, right ankle and foot: Secondary | ICD-10-CM | POA: Diagnosis not present

## 2020-11-17 DIAGNOSIS — E1122 Type 2 diabetes mellitus with diabetic chronic kidney disease: Secondary | ICD-10-CM | POA: Diagnosis not present

## 2020-11-17 DIAGNOSIS — D509 Iron deficiency anemia, unspecified: Secondary | ICD-10-CM | POA: Diagnosis not present

## 2020-11-17 NOTE — Progress Notes (Signed)
Subjective:   Rebecca [name] is a 87 y.o. female who presents for Medicare Annual (Subsequent) preventive examination.  I connected with  [name] on 11/17/20 by audio enabled telemedicine application and verified that I am speaking with the correct person using two identifiers.   I discussed the limitations of evaluation and management by telemedicine. The patient expressed understanding and agreed to proceed.   Location of patient: Home Locations of Provider: Office Persons participating in visit: Rebecca [name] (patient) & Jari Favre, CMA  Review of Systems    Defer to PCP Cardiac Risk Factors include: none     Objective:    Today's Vitals   11/17/20 1823  PainSc: 10-Worst pain ever   There is no height or weight on file to calculate BMI.  Advanced Directives 11/17/2020 02/06/2020 02/01/2020 01/12/2020 01/02/2020 11/24/2018 05/27/2018  Does Patient Have a Medical Advance Directive? Yes Yes Yes No No Yes Yes  Type of Advance Directive Living will Living will - - - [place];Living will [place];Living will  Does patient want to make changes to medical advance directive? Yes (ED - Information included in AVS) No - Patient declined No - Patient declined - - - No - Patient declined  Copy of [place] in Chart? - - - - - No - copy requested -  Would patient like information on creating a medical advance directive? - - - No - Patient declined No - Patient declined - -    Current Medications (verified) Outpatient Encounter Medications as of 11/17/2020  Medication Sig   acetaminophen (TYLENOL) 500 MG tablet Take 1,000 mg by mouth every 6 (six) hours as needed for moderate pain.   apixaban (ELIQUIS) 5 MG TABS tablet Take 1 tablet (5 mg total) by mouth 2 (two) times daily.   Ascorbic Acid (VITAMIN C) 500 MG tablet Take 500 mg by mouth 2 (two) times daily.   atorvastatin (LIPITOR) 10 MG tablet TAKE 1 TABLET BY MOUTH   DAILY AT 6 PM (Patient taking differently: Take 10 mg by mouth daily.)   blood glucose meter kit and supplies KIT Dispense based on patient and insurance preference. Use up to four times daily as directed. (FOR E11.9).   brimonidine (ALPHAGAN) 0.2 % ophthalmic solution Place 1 drop into the right eye 2 (two) times daily.   calcium carbonate (OS-CAL) 600 MG TABS Take 600 mg by mouth daily.   Cod Liver Oil 1000 MG CAPS Take 1,000 mg by mouth daily.    DORZOLAMIDE HCL-TIMOLOL MAL OP Place 1 drop into the right eye 2 (two) times daily.   Dulaglutide (TRULICITY) 1.5 KX/3.8HW SOPN Inject 1.5 mg into the skin once a week.   ferrous sulfate 325 (65 FE) MG tablet Take 1 tablet (325 mg total) by mouth 2 (two) times daily with a meal.   gabapentin (NEURONTIN) 300 MG capsule Take 300 mg by mouth daily.   Glucerna (GLUCERNA) LIQD Take 237 mLs by mouth daily.   HYDROcodone-acetaminophen (NORCO/VICODIN) 5-325 MG tablet Take 1 tablet by mouth daily as needed for moderate pain (chronic foot pain).   insulin aspart (NOVOLOG) 100 UNIT/ML injection 0-15 Units, Subcutaneous, 3 times daily with meals CBG 90: Implement Hypoglycemia measures, call MD CBG 91 - 150: 0 units CBG 151 - 170: 2 units CBG 171 - 200: 3 units CBG 201 - 250: 5 units CBG 251 - 300: 8 units CBG 301 - 350: 11 units CBG 351 - 400: 15 units  CBG > 400: call MD   latanoprost (XALATAN) 0.005 % ophthalmic solution Place 1 drop into the right eye at bedtime.    Levothyroxine Sodium 25 MCG CAPS    lisinopril (ZESTRIL) 40 MG tablet    Multiple Vitamin (MULTIVITAMIN) tablet Take 1 tablet by mouth daily. Prn   nitroGLYCERIN (NITROSTAT) 0.4 MG SL tablet DISSOLVE ONE TABLET UNDER TONGUE AS DIRECTED FOR CHEST PAIN (Patient taking differently: Place 0.4 mg under the tongue every 5 (five) minutes as needed for chest pain.)   pantoprazole (PROTONIX) 40 MG tablet Take 1 tablet (40 mg total) by mouth 2 (two) times daily.   No facility-administered encounter  medications on file as of 11/17/2020.    Allergies (verified) Codeine   History: Past Medical History:  Diagnosis Date   Anemia, unspecified    Ankle pain, left    CAD (coronary artery disease)    Esophageal reflux    Fibrocystic breast disease    Hiatal hernia    Hypercholesteremia    Hypertension    IDDM (insulin dependent diabetes mellitus)    Type II   Multinodular goiter    Sickle-cell trait ([place])    Past Surgical History:  Procedure Laterality Date   "FEMALE"  [place]    CORONARY ANGIOPLASTY     FOOT SURGERY  2004   RETINAL LASER PROCEDURE     Family History  Problem Relation Age of Onset   Hypertension Other    Lung cancer Other    Heart disease Other    Gout Sister    Gout Brother    Thyroid disease Brother    Heart attack Father    Heart attack Mother    Diabetes Mother    Gout Brother    Social History   Socioeconomic History   Marital status: Widowed    Spouse name: Not on file   Number of children: 1   Years of education: Not on file   Highest education level: Not on file  Occupational History   Occupation: retired  Tobacco Use   Smoking status: Never   Smokeless tobacco: Never  Vaping Use   Vaping Use: Never used  Substance and Sexual Activity   Alcohol use: No   Drug use: No   Sexual activity: Never  Other Topics Concern   Not on file  Social History Narrative   Widowed   Gets regular exercise   Supportive church network   Receives daily meals on wheels   [place] monthly grocery delivery      Social Determinants of Health   Financial Resource Strain: Medium Risk   Difficulty of Paying Living Expenses: Somewhat hard  Food Insecurity: No Food Insecurity   Worried About Charity fundraiser in the Last Year: Never true   Arboriculturist in the Last Year: Never true  Transportation Needs: Public librarian (Medical): Yes   Lack of  Transportation (Non-Medical): Yes  Physical Activity: Insufficiently Active   Days of Exercise per Week: 7 days   Minutes of Exercise per Session: 20 min  Stress: No Stress Concern Present   Feeling of Stress : Not at all  Social Connections: Moderately Isolated   Frequency of Communication with Friends and Family: More than three times a week   Frequency of Social Gatherings with Friends and Family: Once a week   Attends Religious Services: 1 to  4 times per year   Active Member of Clubs or Organizations: No   Attends Archivist Meetings: Never   Marital Status: Widowed    Tobacco Counseling Counseling given: Not Answered   Clinical Intake:  Pre-visit preparation completed: Yes  Pain : 0-10 Pain Score: 10-Worst pain ever Pain Type: Acute pain Pain Location: Foot Pain Orientation: Left Pain Onset: 1 to 4 weeks ago Pain Frequency: Constant     BMI - recorded: 32.74 Nutritional Status: BMI > 30  Obese Diabetes: Yes CBG done?: No Did pt. bring in CBG monitor from home?: No  How often do you need to have someone help you when you read instructions, pamphlets, or other written materials from your doctor or pharmacy?: 1 - Never  Diabetic? Yes  Interpreter Needed?: No      Activities of Daily Living In your present state of health, do you have any difficulty performing the following activities: 11/17/2020 02/06/2020  Hearing? Y N  Vision? Y N  Difficulty concentrating or making decisions? Y N  Walking or climbing stairs? N N  Dressing or bathing? Y N  Doing errands, shopping? Y N  Preparing Food and eating ? N -  Using the Toilet? N -  In the past six months, have you accidently leaked urine? N -  Do you have problems with loss of bowel control? N -  Managing your Medications? N -  Managing your Finances? Y -  Housekeeping or managing your Housekeeping? Y -  Some recent data might be hidden    Patient Care Team: Binnie Rail, MD as PCP - General  (Internal Medicine)  Indicate any recent Medical Services you may have received from other than [facility] providers in the past year (date may be approximate).     Assessment:   This is a routine wellness examination for Rebecca [name].  Hearing/Vision screen No results found.  Dietary issues and exercise activities discussed: Current Exercise Habits: The patient does not participate in regular exercise at present, Type of exercise: walking, Time (Minutes): 15, Frequency (Times/Week): 7, Weekly Exercise (Minutes/Week): 105, Intensity: Mild   Goals Addressed   None    Depression Screen PHQ 2/9 Scores 11/17/2020 11/25/2018 11/24/2018 05/27/2018 05/05/2018 04/03/2018 11/18/2017  PHQ - 2 Score 0 0 0 0 0 0 0  PHQ- 9 Score - - - - - - -    Fall Risk Fall Risk  11/17/2020 11/16/2020 06/01/2019 11/24/2018 05/05/2018  Falls in the past year? 0 0 1 0 0  Number falls in past yr: 0 0 1 0 -  Injury with Fall? 0 0 0 0 -  Risk Factor Category  - - - - -  Risk for fall due to : - No Fall Risks - Impaired balance/gait;Impaired mobility -  Follow up - Falls evaluation completed - Falls prevention discussed -    FALL RISK PREVENTION PERTAINING TO THE HOME:  Any stairs in or around the home? No  If so, are there any without handrails? Yes  Home free of loose throw rugs in walkways, pet beds, electrical cords, etc? Yes  Adequate lighting in your home to reduce risk of falls? Yes   ASSISTIVE DEVICES UTILIZED TO PREVENT FALLS:  Life alert? No  Use of a cane, walker or w/c? Yes  Grab bars in the bathroom? Yes  Shower chair or bench in shower? Yes  Elevated toilet seat or a handicapped toilet? Yes   TIMED UP AND GO:  Was the test performed? No .  Cognitive Function: MMSE - Mini Mental State Exam 11/18/2017 07/23/2016  Orientation to time 5 5  Orientation to Place 5 5  Registration 3 3  Attention/ Calculation 5 4  Recall 1 2  Language- name 2 objects 2 2  Language- repeat 1 1  Language- follow 3 step  command 3 3  Language- read & follow direction 1 1  Write a sentence 1 1  Copy design 1 1  Total score 28 28     6CIT Screen 11/17/2020 11/24/2018  What Year? 0 points 0 points  What month? 0 points 0 points  What time? 0 points 0 points  Count back from 20 0 points 0 points  Months in reverse 4 points 2 points  Repeat phrase 0 points 0 points  Total Score 4 2    Immunizations Immunization History  Administered Date(s) Administered   Fluad Quad(high Dose 65+) 11/24/2018, 01/08/2020   Influenza Split 01/22/2011, 02/20/2012   Influenza Whole 03/13/1999, 01/01/2008, 02/07/2009, 12/13/2009   Influenza, High Dose Seasonal PF 02/07/2016, 02/04/2017, 01/27/2018   Influenza,inj,Quad PF,6+ Mos 01/16/2013   PFIZER(Purple Top)SARS-COV-2 Vaccination 04/25/2019, 05/20/2019, 12/26/2019   Pneumococcal Conjugate-13 07/14/2015   Pneumococcal Polysaccharide-23 05/08/2006    TDAP status: Due, Education has been provided regarding the importance of this vaccine. Advised may receive this vaccine at local pharmacy or Health Dept. Aware to provide a copy of the vaccination record if obtained from local pharmacy or Health Dept. Verbalized acceptance and understanding.  Flu Vaccine status: Due, Education has been provided regarding the importance of this vaccine. Advised may receive this vaccine at local pharmacy or Health Dept. Aware to provide a copy of the vaccination record if obtained from local pharmacy or Health Dept. Verbalized acceptance and understanding.  Pneumococcal vaccine status: Up to date  Covid-19 vaccine status: Information provided on how to obtain vaccines.   Qualifies for Shingles Vaccine? Yes   Zostavax completed No   Shingrix Completed?: No.    Education has been provided regarding the importance of this vaccine. Patient has been advised to call insurance company to determine out of pocket expense if they have not yet received this vaccine. Advised may also receive vaccine at  local pharmacy or Health Dept. Verbalized acceptance and understanding.  Screening Tests Health Maintenance  Topic Date Due   DEXA SCAN  Never done   FOOT EXAM  11/11/2019   HEMOGLOBIN A1C  07/03/2020   OPHTHALMOLOGY EXAM  08/23/2020   COVID-19 Vaccine (4 - Booster for Pfizer series) 12/02/2020 (Originally 04/27/2020)   Zoster Vaccines- Shingrix (1 of 2) 02/16/2021 (Originally 06/20/1951)   INFLUENZA VACCINE  06/09/2021 (Originally 10/10/2020)   TETANUS/TDAP  11/17/2021 (Originally 06/20/1951)   PNA vac Low Risk Adult  Completed   HPV VACCINES  Aged Out    Health Maintenance  Health Maintenance Due  Topic Date Due   DEXA SCAN  Never done   FOOT EXAM  11/11/2019   HEMOGLOBIN A1C  07/03/2020   OPHTHALMOLOGY EXAM  08/23/2020    Colorectal cancer screening: No longer required.   Mammogram status: No longer required due to age.  Bone Density status: Ordered on 11/30/19. Pt provided with contact info and advised to call to schedule appt.  Lung Cancer Screening: (Low Dose CT Chest recommended if Age 83-80 years, 30 pack-year currently smoking OR have quit w/in 15years.) does not qualify.    Additional Screening:  Hepatitis C Screening: does not qualify  Vision Screening: Recommended annual ophthalmology exams for early detection of glaucoma and  other disorders of the eye. Is the patient up to date with their annual eye exam?  No  Who is the provider or what is the name of the office in which the patient attends annual eye exams? Dr. Kathlen Mody If pt is not established with a provider, would they like to be referred to a provider to establish care? No .   Dental Screening: Recommended annual dental exams for proper oral hygiene  Community Resource Referral / Chronic Care Management: CRR required this visit?  Yes   CCM required this visit?  No      Plan:     I have personally reviewed and noted the following in the patient's chart:   Medical and social history Use of alcohol,  tobacco or illicit drugs  Current medications and supplements including opioid prescriptions.  Functional ability and status Nutritional status Physical activity Advanced directives List of other physicians Hospitalizations, surgeries, and ER visits in previous 12 months Vitals Screenings to include cognitive, depression, and falls Referrals and appointments  In addition, I have reviewed and discussed with patient certain preventive protocols, quality metrics, and best practice recommendations. A written personalized care plan for preventive services as well as general preventive health recommendations were provided to patient.     Cannon Kettle, [place]   11/17/2020   Nurse Notes: 24 minutes non face to face

## 2020-11-17 NOTE — Patient Instructions (Signed)
Health Maintenance, Female Adopting a healthy lifestyle and getting preventive care are important in promoting health and wellness. Ask your health care provider about: The right schedule for you to have regular tests and exams. Things you can do on your own to prevent diseases and keep yourself healthy. What should I know about diet, weight, and exercise? Eat a healthy diet  Eat a diet that includes plenty of vegetables, fruits, low-fat dairy products, and lean protein. Do not eat a lot of foods that are high in solid fats, added sugars, or sodium. Maintain a healthy weight Body mass index (BMI) is used to identify weight problems. It estimates body fat based on height and weight. Your health care provider can help determine your BMI and help you achieve or maintain a healthy weight. Get regular exercise Get regular exercise. This is one of the most important things you can do for your health. Most adults should: Exercise for at least 150 minutes each week. The exercise should increase your heart rate and make you sweat (moderate-intensity exercise). Do strengthening exercises at least twice a week. This is in addition to the moderate-intensity exercise. Spend less time sitting. Even light physical activity can be beneficial. Watch cholesterol and blood lipids Have your blood tested for lipids and cholesterol at 85 years of age, then have this test every 5 years. Have your cholesterol levels checked more often if: Your lipid or cholesterol levels are high. You are older than 85 years of age. You are at high risk for heart disease. What should I know about cancer screening? Depending on your health history and family history, you may need to have cancer screening at various ages. This may include screening for: Breast cancer. Cervical cancer. Colorectal cancer. Skin cancer. Lung cancer. What should I know about heart disease, diabetes, and high blood pressure? Blood pressure and heart  disease High blood pressure causes heart disease and increases the risk of stroke. This is more likely to develop in people who have high blood pressure readings, are of African descent, or are overweight. Have your blood pressure checked: Every 3-5 years if you are 18-39 years of age. Every year if you are 40 years old or older. Diabetes Have regular diabetes screenings. This checks your fasting blood sugar level. Have the screening done: Once every three years after age 40 if you are at a normal weight and have a low risk for diabetes. More often and at a younger age if you are overweight or have a high risk for diabetes. What should I know about preventing infection? Hepatitis B If you have a higher risk for hepatitis B, you should be screened for this virus. Talk with your health care provider to find out if you are at risk for hepatitis B infection. Hepatitis C Testing is recommended for: Everyone born from 1945 through 1965. Anyone with known risk factors for hepatitis C. Sexually transmitted infections (STIs) Get screened for STIs, including gonorrhea and chlamydia, if: You are sexually active and are younger than 85 years of age. You are older than 85 years of age and your health care provider tells you that you are at risk for this type of infection. Your sexual activity has changed since you were last screened, and you are at increased risk for chlamydia or gonorrhea. Ask your health care provider if you are at risk. Ask your health care provider about whether you are at high risk for HIV. Your health care provider may recommend a prescription medicine   to help prevent HIV infection. If you choose to take medicine to prevent HIV, you should first get tested for HIV. You should then be tested every 3 months for as long as you are taking the medicine. Pregnancy If you are about to stop having your period (premenopausal) and you may become pregnant, seek counseling before you get  pregnant. Take 400 to 800 micrograms (mcg) of folic acid every day if you become pregnant. Ask for birth control (contraception) if you want to prevent pregnancy. Osteoporosis and menopause Osteoporosis is a disease in which the bones lose minerals and strength with aging. This can result in bone fractures. If you are 65 years old or older, or if you are at risk for osteoporosis and fractures, ask your health care provider if you should: Be screened for bone loss. Take a calcium or vitamin D supplement to lower your risk of fractures. Be given hormone replacement therapy (HRT) to treat symptoms of menopause. Follow these instructions at home: Lifestyle Do not use any products that contain nicotine or tobacco, such as cigarettes, e-cigarettes, and chewing tobacco. If you need help quitting, ask your health care provider. Do not use street drugs. Do not share needles. Ask your health care provider for help if you need support or information about quitting drugs. Alcohol use Do not drink alcohol if: Your health care provider tells you not to drink. You are pregnant, may be pregnant, or are planning to become pregnant. If you drink alcohol: Limit how much you use to 0-1 drink a day. Limit intake if you are breastfeeding. Be aware of how much alcohol is in your drink. In the [place] one drink equals one 12 oz bottle of beer (355 mL), one 5 oz glass of wine (148 mL), or one 1 oz glass of hard liquor (44 mL). General instructions Schedule regular health, dental, and eye exams. Stay current with your vaccines. Tell your health care provider if: You often feel depressed. You have ever been abused or do not feel safe at home. Summary Adopting a healthy lifestyle and getting preventive care are important in promoting health and wellness. Follow your health care provider's instructions about healthy diet, exercising, and getting tested or screened for diseases. Follow your health care provider's  instructions on monitoring your cholesterol and blood pressure. This information is not intended to replace advice given to you by your health care provider. Make sure you discuss any questions you have with your health care provider. Document Revised: 05/06/2020 Document Reviewed: 02/19/2018 Elsevier Patient Education  2022 Elsevier Inc.  

## 2020-11-17 NOTE — Telephone Encounter (Signed)
Spoke with Mary today.

## 2020-11-18 DIAGNOSIS — M170 Bilateral primary osteoarthritis of knee: Secondary | ICD-10-CM | POA: Diagnosis not present

## 2020-11-18 DIAGNOSIS — E042 Nontoxic multinodular goiter: Secondary | ICD-10-CM | POA: Diagnosis not present

## 2020-11-18 DIAGNOSIS — M19071 Primary osteoarthritis, right ankle and foot: Secondary | ICD-10-CM | POA: Diagnosis not present

## 2020-11-18 DIAGNOSIS — I130 Hypertensive heart and chronic kidney disease with heart failure and stage 1 through stage 4 chronic kidney disease, or unspecified chronic kidney disease: Secondary | ICD-10-CM | POA: Diagnosis not present

## 2020-11-18 DIAGNOSIS — E1122 Type 2 diabetes mellitus with diabetic chronic kidney disease: Secondary | ICD-10-CM | POA: Diagnosis not present

## 2020-11-18 DIAGNOSIS — N1831 Chronic kidney disease, stage 3a: Secondary | ICD-10-CM | POA: Diagnosis not present

## 2020-11-18 DIAGNOSIS — I5032 Chronic diastolic (congestive) heart failure: Secondary | ICD-10-CM | POA: Diagnosis not present

## 2020-11-18 DIAGNOSIS — D509 Iron deficiency anemia, unspecified: Secondary | ICD-10-CM | POA: Diagnosis not present

## 2020-11-18 DIAGNOSIS — D631 Anemia in chronic kidney disease: Secondary | ICD-10-CM | POA: Diagnosis not present

## 2020-11-21 DIAGNOSIS — I480 Paroxysmal atrial fibrillation: Secondary | ICD-10-CM

## 2020-11-21 DIAGNOSIS — K219 Gastro-esophageal reflux disease without esophagitis: Secondary | ICD-10-CM

## 2020-11-21 DIAGNOSIS — M19071 Primary osteoarthritis, right ankle and foot: Secondary | ICD-10-CM

## 2020-11-21 DIAGNOSIS — I088 Other rheumatic multiple valve diseases: Secondary | ICD-10-CM

## 2020-11-21 DIAGNOSIS — E1142 Type 2 diabetes mellitus with diabetic polyneuropathy: Secondary | ICD-10-CM

## 2020-11-21 DIAGNOSIS — E039 Hypothyroidism, unspecified: Secondary | ICD-10-CM

## 2020-11-21 DIAGNOSIS — M6281 Muscle weakness (generalized): Secondary | ICD-10-CM

## 2020-11-21 DIAGNOSIS — E1151 Type 2 diabetes mellitus with diabetic peripheral angiopathy without gangrene: Secondary | ICD-10-CM

## 2020-11-21 DIAGNOSIS — Z794 Long term (current) use of insulin: Secondary | ICD-10-CM

## 2020-11-21 DIAGNOSIS — M19072 Primary osteoarthritis, left ankle and foot: Secondary | ICD-10-CM

## 2020-11-21 DIAGNOSIS — I5032 Chronic diastolic (congestive) heart failure: Secondary | ICD-10-CM | POA: Diagnosis not present

## 2020-11-21 DIAGNOSIS — E669 Obesity, unspecified: Secondary | ICD-10-CM

## 2020-11-21 DIAGNOSIS — M25461 Effusion, right knee: Secondary | ICD-10-CM

## 2020-11-21 DIAGNOSIS — Z6835 Body mass index (BMI) 35.0-35.9, adult: Secondary | ICD-10-CM

## 2020-11-21 DIAGNOSIS — M4800 Spinal stenosis, site unspecified: Secondary | ICD-10-CM

## 2020-11-21 DIAGNOSIS — I700 Atherosclerosis of aorta: Secondary | ICD-10-CM

## 2020-11-21 DIAGNOSIS — E042 Nontoxic multinodular goiter: Secondary | ICD-10-CM | POA: Diagnosis not present

## 2020-11-21 DIAGNOSIS — D631 Anemia in chronic kidney disease: Secondary | ICD-10-CM

## 2020-11-21 DIAGNOSIS — I130 Hypertensive heart and chronic kidney disease with heart failure and stage 1 through stage 4 chronic kidney disease, or unspecified chronic kidney disease: Secondary | ICD-10-CM | POA: Diagnosis not present

## 2020-11-21 DIAGNOSIS — Z79899 Other long term (current) drug therapy: Secondary | ICD-10-CM

## 2020-11-21 DIAGNOSIS — Z7901 Long term (current) use of anticoagulants: Secondary | ICD-10-CM

## 2020-11-21 DIAGNOSIS — D509 Iron deficiency anemia, unspecified: Secondary | ICD-10-CM

## 2020-11-21 DIAGNOSIS — M810 Age-related osteoporosis without current pathological fracture: Secondary | ICD-10-CM

## 2020-11-21 DIAGNOSIS — E1122 Type 2 diabetes mellitus with diabetic chronic kidney disease: Secondary | ICD-10-CM | POA: Diagnosis not present

## 2020-11-21 DIAGNOSIS — I2510 Atherosclerotic heart disease of native coronary artery without angina pectoris: Secondary | ICD-10-CM

## 2020-11-21 DIAGNOSIS — H409 Unspecified glaucoma: Secondary | ICD-10-CM

## 2020-11-21 DIAGNOSIS — N1831 Chronic kidney disease, stage 3a: Secondary | ICD-10-CM

## 2020-11-21 DIAGNOSIS — M170 Bilateral primary osteoarthritis of knee: Secondary | ICD-10-CM | POA: Diagnosis not present

## 2020-11-22 DIAGNOSIS — I130 Hypertensive heart and chronic kidney disease with heart failure and stage 1 through stage 4 chronic kidney disease, or unspecified chronic kidney disease: Secondary | ICD-10-CM | POA: Diagnosis not present

## 2020-11-22 DIAGNOSIS — E042 Nontoxic multinodular goiter: Secondary | ICD-10-CM | POA: Diagnosis not present

## 2020-11-22 DIAGNOSIS — M19071 Primary osteoarthritis, right ankle and foot: Secondary | ICD-10-CM | POA: Diagnosis not present

## 2020-11-22 DIAGNOSIS — I5032 Chronic diastolic (congestive) heart failure: Secondary | ICD-10-CM | POA: Diagnosis not present

## 2020-11-22 DIAGNOSIS — N1831 Chronic kidney disease, stage 3a: Secondary | ICD-10-CM | POA: Diagnosis not present

## 2020-11-22 DIAGNOSIS — M170 Bilateral primary osteoarthritis of knee: Secondary | ICD-10-CM | POA: Diagnosis not present

## 2020-11-22 DIAGNOSIS — D509 Iron deficiency anemia, unspecified: Secondary | ICD-10-CM | POA: Diagnosis not present

## 2020-11-22 DIAGNOSIS — E1122 Type 2 diabetes mellitus with diabetic chronic kidney disease: Secondary | ICD-10-CM | POA: Diagnosis not present

## 2020-11-22 DIAGNOSIS — D631 Anemia in chronic kidney disease: Secondary | ICD-10-CM | POA: Diagnosis not present

## 2020-11-23 DIAGNOSIS — M19071 Primary osteoarthritis, right ankle and foot: Secondary | ICD-10-CM | POA: Diagnosis not present

## 2020-11-23 DIAGNOSIS — I5032 Chronic diastolic (congestive) heart failure: Secondary | ICD-10-CM | POA: Diagnosis not present

## 2020-11-23 DIAGNOSIS — N1831 Chronic kidney disease, stage 3a: Secondary | ICD-10-CM | POA: Diagnosis not present

## 2020-11-23 DIAGNOSIS — M170 Bilateral primary osteoarthritis of knee: Secondary | ICD-10-CM | POA: Diagnosis not present

## 2020-11-23 DIAGNOSIS — E1122 Type 2 diabetes mellitus with diabetic chronic kidney disease: Secondary | ICD-10-CM | POA: Diagnosis not present

## 2020-11-23 DIAGNOSIS — I130 Hypertensive heart and chronic kidney disease with heart failure and stage 1 through stage 4 chronic kidney disease, or unspecified chronic kidney disease: Secondary | ICD-10-CM | POA: Diagnosis not present

## 2020-11-23 DIAGNOSIS — D509 Iron deficiency anemia, unspecified: Secondary | ICD-10-CM | POA: Diagnosis not present

## 2020-11-23 DIAGNOSIS — E042 Nontoxic multinodular goiter: Secondary | ICD-10-CM | POA: Diagnosis not present

## 2020-11-23 DIAGNOSIS — D631 Anemia in chronic kidney disease: Secondary | ICD-10-CM | POA: Diagnosis not present

## 2020-11-28 ENCOUNTER — Telehealth: Payer: Self-pay | Admitting: Internal Medicine

## 2020-11-28 DIAGNOSIS — N1831 Chronic kidney disease, stage 3a: Secondary | ICD-10-CM | POA: Diagnosis not present

## 2020-11-28 DIAGNOSIS — E042 Nontoxic multinodular goiter: Secondary | ICD-10-CM | POA: Diagnosis not present

## 2020-11-28 DIAGNOSIS — M170 Bilateral primary osteoarthritis of knee: Secondary | ICD-10-CM | POA: Diagnosis not present

## 2020-11-28 DIAGNOSIS — M19071 Primary osteoarthritis, right ankle and foot: Secondary | ICD-10-CM | POA: Diagnosis not present

## 2020-11-28 DIAGNOSIS — E1122 Type 2 diabetes mellitus with diabetic chronic kidney disease: Secondary | ICD-10-CM | POA: Diagnosis not present

## 2020-11-28 DIAGNOSIS — I5032 Chronic diastolic (congestive) heart failure: Secondary | ICD-10-CM | POA: Diagnosis not present

## 2020-11-28 DIAGNOSIS — D631 Anemia in chronic kidney disease: Secondary | ICD-10-CM | POA: Diagnosis not present

## 2020-11-28 DIAGNOSIS — I130 Hypertensive heart and chronic kidney disease with heart failure and stage 1 through stage 4 chronic kidney disease, or unspecified chronic kidney disease: Secondary | ICD-10-CM | POA: Diagnosis not present

## 2020-11-28 DIAGNOSIS — D509 Iron deficiency anemia, unspecified: Secondary | ICD-10-CM | POA: Diagnosis not present

## 2020-12-01 MED ORDER — INSULIN ASPART 100 UNIT/ML IJ SOLN: INTRAMUSCULAR | 5 refills | Status: DC

## 2020-12-01 MED ORDER — LANTUS SOLOSTAR 100 UNIT/ML ~~LOC~~ SOPN: 10.0000 [IU] | PEN_INJECTOR | Freq: Every day | SUBCUTANEOUS | 2 refills | Status: DC

## 2020-12-01 NOTE — Telephone Encounter (Signed)
Ok - previous message was not very clear.   Ok to restart lantus at 10 units daily and we can increase the dose if needed.  Sent to pof

## 2020-12-01 NOTE — Telephone Encounter (Signed)
Ok - we can change it back - need to monitor closely for low sugar    Patient Sig: 0-15 Units, Subcutaneous, 3 times daily with meals CBG < 70: Implement Hypoglycemia measures, call MD CBG 70 - 120: 0 units CBG 121 - 150: 2 units CBG 151 - 200: 3 units CBG 201 - 250: 5 units CBG 251 - 300: 8 units CBG 301 - 350: 11 units CBG 351 - 400: 15 units CBG > 400: call MD   New rx sent to pharmacy.

## 2020-12-01 NOTE — Telephone Encounter (Signed)
Spoke with patient today. 

## 2020-12-01 NOTE — Addendum Note (Signed)
Addended by: Pincus Sanes on: 12/01/2020 02:38 PM   Modules accepted: Orders

## 2020-12-01 NOTE — Addendum Note (Signed)
Addended by: Pincus Sanes on: 12/01/2020 09:39 AM   Modules accepted: Orders

## 2020-12-01 NOTE — Telephone Encounter (Signed)
Patient  calling to report blood sugar this morning is 139. Patient states she would like to take insulin each morning.  She wants the sliding scale changed   Please call

## 2020-12-02 DIAGNOSIS — M170 Bilateral primary osteoarthritis of knee: Secondary | ICD-10-CM | POA: Diagnosis not present

## 2020-12-02 DIAGNOSIS — N1831 Chronic kidney disease, stage 3a: Secondary | ICD-10-CM | POA: Diagnosis not present

## 2020-12-02 DIAGNOSIS — I130 Hypertensive heart and chronic kidney disease with heart failure and stage 1 through stage 4 chronic kidney disease, or unspecified chronic kidney disease: Secondary | ICD-10-CM | POA: Diagnosis not present

## 2020-12-02 DIAGNOSIS — M19071 Primary osteoarthritis, right ankle and foot: Secondary | ICD-10-CM | POA: Diagnosis not present

## 2020-12-02 DIAGNOSIS — E1122 Type 2 diabetes mellitus with diabetic chronic kidney disease: Secondary | ICD-10-CM | POA: Diagnosis not present

## 2020-12-02 DIAGNOSIS — D631 Anemia in chronic kidney disease: Secondary | ICD-10-CM | POA: Diagnosis not present

## 2020-12-02 DIAGNOSIS — I5032 Chronic diastolic (congestive) heart failure: Secondary | ICD-10-CM | POA: Diagnosis not present

## 2020-12-02 DIAGNOSIS — E042 Nontoxic multinodular goiter: Secondary | ICD-10-CM | POA: Diagnosis not present

## 2020-12-02 DIAGNOSIS — D509 Iron deficiency anemia, unspecified: Secondary | ICD-10-CM | POA: Diagnosis not present

## 2020-12-06 ENCOUNTER — Other Ambulatory Visit: Payer: Self-pay

## 2020-12-06 ENCOUNTER — Telehealth: Payer: Self-pay | Admitting: Internal Medicine

## 2020-12-06 DIAGNOSIS — N1831 Chronic kidney disease, stage 3a: Secondary | ICD-10-CM | POA: Diagnosis not present

## 2020-12-06 DIAGNOSIS — M19071 Primary osteoarthritis, right ankle and foot: Secondary | ICD-10-CM | POA: Diagnosis not present

## 2020-12-06 DIAGNOSIS — D509 Iron deficiency anemia, unspecified: Secondary | ICD-10-CM | POA: Diagnosis not present

## 2020-12-06 DIAGNOSIS — E1122 Type 2 diabetes mellitus with diabetic chronic kidney disease: Secondary | ICD-10-CM | POA: Diagnosis not present

## 2020-12-06 DIAGNOSIS — I130 Hypertensive heart and chronic kidney disease with heart failure and stage 1 through stage 4 chronic kidney disease, or unspecified chronic kidney disease: Secondary | ICD-10-CM | POA: Diagnosis not present

## 2020-12-06 DIAGNOSIS — D631 Anemia in chronic kidney disease: Secondary | ICD-10-CM | POA: Diagnosis not present

## 2020-12-06 DIAGNOSIS — M170 Bilateral primary osteoarthritis of knee: Secondary | ICD-10-CM | POA: Diagnosis not present

## 2020-12-06 DIAGNOSIS — E042 Nontoxic multinodular goiter: Secondary | ICD-10-CM | POA: Diagnosis not present

## 2020-12-06 DIAGNOSIS — I5032 Chronic diastolic (congestive) heart failure: Secondary | ICD-10-CM | POA: Diagnosis not present

## 2020-12-06 MED ORDER — GABAPENTIN 300 MG PO CAPS
300.0000 mg | ORAL_CAPSULE | Freq: Every day | ORAL | 0 refills | Status: DC
Start: 2020-12-06 — End: 2021-01-09

## 2020-12-06 MED ORDER — LISINOPRIL 40 MG PO TABS
40.0000 mg | ORAL_TABLET | Freq: Every day | ORAL | 0 refills | Status: DC
Start: 2020-12-06 — End: 2020-12-15

## 2020-12-06 MED ORDER — ATORVASTATIN CALCIUM 10 MG PO TABS
ORAL_TABLET | ORAL | 0 refills | Status: DC
Start: 2020-12-06 — End: 2021-01-09

## 2020-12-06 MED ORDER — APIXABAN 5 MG PO TABS
5.0000 mg | ORAL_TABLET | Freq: Two times a day (BID) | ORAL | 0 refills | Status: DC
Start: 2020-12-06 — End: 2021-01-30

## 2020-12-06 MED ORDER — PANTOPRAZOLE SODIUM 40 MG PO TBEC
40.0000 mg | DELAYED_RELEASE_TABLET | Freq: Two times a day (BID) | ORAL | 0 refills | Status: DC
Start: 2020-12-06 — End: 2020-12-23

## 2020-12-06 NOTE — Telephone Encounter (Signed)
Refills sent in today. Patient needs appointment for future refills.

## 2020-12-06 NOTE — Telephone Encounter (Signed)
1.Medication Requested: atorvastatin (LIPITOR) 10 MG tablet gabapentin (NEURONTIN) 300 MG capsule apixaban (ELIQUIS) 5 MG TABS tablet lisinopril (ZESTRIL) 40 MG tablet pantoprazole (PROTONIX) 40 MG tablet   2. Pharmacy (Name, Street, [place]): [facility] [address] [place]), [place] - [id number]  Phone:  [phone] Fax:  [phone]   3. On Med List: yes  4. Last Visit with PCP: 09.07.22  5. Next visit date with PCP: n/a   Agent: Please be advised that RX refills may take up to 3 business days. We ask that you follow-up with your pharmacy.

## 2020-12-07 DIAGNOSIS — N1831 Chronic kidney disease, stage 3a: Secondary | ICD-10-CM | POA: Diagnosis not present

## 2020-12-07 DIAGNOSIS — I5032 Chronic diastolic (congestive) heart failure: Secondary | ICD-10-CM | POA: Diagnosis not present

## 2020-12-07 DIAGNOSIS — E042 Nontoxic multinodular goiter: Secondary | ICD-10-CM | POA: Diagnosis not present

## 2020-12-07 DIAGNOSIS — I130 Hypertensive heart and chronic kidney disease with heart failure and stage 1 through stage 4 chronic kidney disease, or unspecified chronic kidney disease: Secondary | ICD-10-CM | POA: Diagnosis not present

## 2020-12-07 DIAGNOSIS — D509 Iron deficiency anemia, unspecified: Secondary | ICD-10-CM | POA: Diagnosis not present

## 2020-12-07 DIAGNOSIS — E1122 Type 2 diabetes mellitus with diabetic chronic kidney disease: Secondary | ICD-10-CM | POA: Diagnosis not present

## 2020-12-07 DIAGNOSIS — D631 Anemia in chronic kidney disease: Secondary | ICD-10-CM | POA: Diagnosis not present

## 2020-12-07 DIAGNOSIS — M170 Bilateral primary osteoarthritis of knee: Secondary | ICD-10-CM | POA: Diagnosis not present

## 2020-12-07 DIAGNOSIS — M19071 Primary osteoarthritis, right ankle and foot: Secondary | ICD-10-CM | POA: Diagnosis not present

## 2020-12-09 DIAGNOSIS — D509 Iron deficiency anemia, unspecified: Secondary | ICD-10-CM | POA: Diagnosis not present

## 2020-12-09 DIAGNOSIS — D631 Anemia in chronic kidney disease: Secondary | ICD-10-CM | POA: Diagnosis not present

## 2020-12-09 DIAGNOSIS — M19071 Primary osteoarthritis, right ankle and foot: Secondary | ICD-10-CM | POA: Diagnosis not present

## 2020-12-09 DIAGNOSIS — E042 Nontoxic multinodular goiter: Secondary | ICD-10-CM | POA: Diagnosis not present

## 2020-12-09 DIAGNOSIS — I5032 Chronic diastolic (congestive) heart failure: Secondary | ICD-10-CM | POA: Diagnosis not present

## 2020-12-09 DIAGNOSIS — M170 Bilateral primary osteoarthritis of knee: Secondary | ICD-10-CM | POA: Diagnosis not present

## 2020-12-09 DIAGNOSIS — I130 Hypertensive heart and chronic kidney disease with heart failure and stage 1 through stage 4 chronic kidney disease, or unspecified chronic kidney disease: Secondary | ICD-10-CM | POA: Diagnosis not present

## 2020-12-09 DIAGNOSIS — N1831 Chronic kidney disease, stage 3a: Secondary | ICD-10-CM | POA: Diagnosis not present

## 2020-12-09 DIAGNOSIS — E1122 Type 2 diabetes mellitus with diabetic chronic kidney disease: Secondary | ICD-10-CM | POA: Diagnosis not present

## 2020-12-12 ENCOUNTER — Telehealth: Payer: Self-pay | Admitting: Internal Medicine

## 2020-12-12 DIAGNOSIS — D509 Iron deficiency anemia, unspecified: Secondary | ICD-10-CM | POA: Diagnosis not present

## 2020-12-12 DIAGNOSIS — D631 Anemia in chronic kidney disease: Secondary | ICD-10-CM | POA: Diagnosis not present

## 2020-12-12 DIAGNOSIS — M19071 Primary osteoarthritis, right ankle and foot: Secondary | ICD-10-CM | POA: Diagnosis not present

## 2020-12-12 DIAGNOSIS — E1122 Type 2 diabetes mellitus with diabetic chronic kidney disease: Secondary | ICD-10-CM | POA: Diagnosis not present

## 2020-12-12 DIAGNOSIS — E042 Nontoxic multinodular goiter: Secondary | ICD-10-CM | POA: Diagnosis not present

## 2020-12-12 DIAGNOSIS — I5032 Chronic diastolic (congestive) heart failure: Secondary | ICD-10-CM | POA: Diagnosis not present

## 2020-12-12 DIAGNOSIS — I130 Hypertensive heart and chronic kidney disease with heart failure and stage 1 through stage 4 chronic kidney disease, or unspecified chronic kidney disease: Secondary | ICD-10-CM | POA: Diagnosis not present

## 2020-12-12 DIAGNOSIS — M170 Bilateral primary osteoarthritis of knee: Secondary | ICD-10-CM | POA: Diagnosis not present

## 2020-12-12 DIAGNOSIS — N1831 Chronic kidney disease, stage 3a: Secondary | ICD-10-CM | POA: Diagnosis not present

## 2020-12-12 NOTE — Telephone Encounter (Signed)
[facility] FYI 9.30.22 ....  Caller stated that she is now on Aspart insulin TID w/ meals and Lantus 10 units every morning. Explained that Novolog already ordered is same as Aspart. No s/s. She stated that she has to draw up the new med; ins her to call back if any issues, s/s, or questions. Ins her to not take the Novolog since she was ins to stop taking it. Ins per RN experience and knowledge. **OFFICE** Pls note that I had to explain this several times to pt before she could verbalize understanding and stated that she had been very confused with all the changes she's had.

## 2020-12-13 DIAGNOSIS — D509 Iron deficiency anemia, unspecified: Secondary | ICD-10-CM | POA: Diagnosis not present

## 2020-12-13 DIAGNOSIS — N1831 Chronic kidney disease, stage 3a: Secondary | ICD-10-CM | POA: Diagnosis not present

## 2020-12-13 DIAGNOSIS — E042 Nontoxic multinodular goiter: Secondary | ICD-10-CM | POA: Diagnosis not present

## 2020-12-13 DIAGNOSIS — E1122 Type 2 diabetes mellitus with diabetic chronic kidney disease: Secondary | ICD-10-CM | POA: Diagnosis not present

## 2020-12-13 DIAGNOSIS — I130 Hypertensive heart and chronic kidney disease with heart failure and stage 1 through stage 4 chronic kidney disease, or unspecified chronic kidney disease: Secondary | ICD-10-CM | POA: Diagnosis not present

## 2020-12-13 DIAGNOSIS — M170 Bilateral primary osteoarthritis of knee: Secondary | ICD-10-CM | POA: Diagnosis not present

## 2020-12-13 DIAGNOSIS — D631 Anemia in chronic kidney disease: Secondary | ICD-10-CM | POA: Diagnosis not present

## 2020-12-13 DIAGNOSIS — M19071 Primary osteoarthritis, right ankle and foot: Secondary | ICD-10-CM | POA: Diagnosis not present

## 2020-12-13 DIAGNOSIS — I5032 Chronic diastolic (congestive) heart failure: Secondary | ICD-10-CM | POA: Diagnosis not present

## 2020-12-13 MED ORDER — COMFORT TOUCH INSULIN PEN NEED 31G X 5 MM MISC: 5 refills | Status: DC

## 2020-12-13 MED ORDER — INSULIN ASPART 100 UNIT/ML FLEXPEN: PEN_INJECTOR | SUBCUTANEOUS | 11 refills | Status: DC

## 2020-12-13 NOTE — Telephone Encounter (Signed)
Spoke with patient   Reports that last week she did have some questions about how to appropriately use sliding scale with aspart insulin, has been counseled on use of SS and is comfortable - patient reports that pharmacy filled new prescription for vial so she will need to draw up insulin dose from vial  Discussed with patient that I will reach out to PCP to have switched to prefilled pens to help facilitate dosing, patient also asked for refill on her pen needles as well  Discussed with patient about CCM services, was agreeable to follow with CCM, appointment scheduled for next available appointment    Care Management  Note   12/13/2020 Name: Rebecca [name] MRN: [id number] DOB: 02/19/1933  Rebecca [name] is a 88 y.o. year old female who is a primary care patient of Burns, Stacy J, MD. The care management team was consulted for assistance with chronic disease management and care coordination needs.   [name] was given information about Care Management services today including:  CCM service includes personalized support from designated clinical staff supervised by the physician, including individualized plan of care and coordination with other care providers 24/7 contact phone numbers for assistance for urgent and routine care needs. Service will only be billed when office clinical staff spend 20 minutes or more in a month to coordinate care. Only one practitioner may furnish and bill the service in a calendar month. The patient may stop CCM services at amy time (effective at the end of the month) by phone call to the office staff. The patient will be responsible for cost sharing (co-pay) or up to 20% of the service fee (after annual deductible is met)  Patient agreed to services and verbal consent obtained.  Daniel C Szabat, PharmD Clinical Pharmacist, [facility]  

## 2020-12-13 NOTE — Addendum Note (Signed)
Addended by: Pincus Sanes on: 12/13/2020 04:39 PM   Modules accepted: Orders

## 2020-12-14 ENCOUNTER — Telehealth: Payer: Self-pay

## 2020-12-14 NOTE — Telephone Encounter (Signed)
Attempted to call patient to give update about insulin  Spoke with pharmacy who has confirmed novolog pre-filled pens were received and are covered by the insurance at $0 - updated prescription for pen needles also received   Will wait for patient to return call for further discussion - unable to leave message   Ellin Saba, PharmD Clinical Pharmacist, [facility]

## 2020-12-15 ENCOUNTER — Other Ambulatory Visit: Payer: Self-pay

## 2020-12-15 ENCOUNTER — Telehealth: Payer: Self-pay | Admitting: Internal Medicine

## 2020-12-15 MED ORDER — LISINOPRIL 20 MG PO TABS: 20.0000 mg | ORAL_TABLET | Freq: Every day | ORAL | 3 refills | Status: DC

## 2020-12-15 NOTE — Telephone Encounter (Signed)
She really needs to get a BP cuff to monitor her BP   Have her call her insurance company to see if they cover it or can send her one.    BP Readings from Last 3 Encounters:  11/16/20 118/60  08/29/20 118/72  07/27/20 128/78   Have her taking only 20 mg of the lisiopril for now instead of 40 mg - will update med list.

## 2020-12-15 NOTE — Telephone Encounter (Signed)
What is her BP now - if still low hold lisinopril    I would like her to monitor her BP 2/day and call us with her BP numbers.

## 2020-12-15 NOTE — Telephone Encounter (Signed)
    Patient seeking advice for BP Patient states on 10/5, in the evening her blood pressure was 93/52, she felt tired and week.  She has not taken her blood pressure today. She would like to know if she should take her medications today as prescribed.  Please call

## 2020-12-15 NOTE — Telephone Encounter (Signed)
Spoke with patient today and info given.  She will call insurance to see if cuff is covered.

## 2020-12-20 NOTE — Telephone Encounter (Signed)
[facility] FYI 12/16/2020...   Caller states she was needing her BP medication called in. States her son went to the pharmacy and only blood thinner was called in but pharmacist later told her her blood pressure medication actually was called in. Supposed to be changed from a 40mg  tablet to 20mg . States she does not have a machine at home to check her BP but knows it has been low.   States she should be able to pick up the prescription in the morning. Advised she call back with any issues

## 2020-12-21 MED ORDER — BD AUTOSHIELD DUO 30G X 5 MM MISC: 3 refills | Status: DC

## 2020-12-21 NOTE — Telephone Encounter (Signed)
PT states Insulin Pen Needle (COMFORT TOUCH INSULIN PEN NEED) 31G X 5 MM MISC is not working for her. PT prefers auto shield duo 30g x 1mm  Please advise

## 2020-12-21 NOTE — Addendum Note (Signed)
Addended by: Pincus Sanes on: 12/21/2020 11:27 AM   Modules accepted: Orders

## 2020-12-21 NOTE — Telephone Encounter (Signed)
New prescription sent to pharmacy 

## 2020-12-22 DIAGNOSIS — M19071 Primary osteoarthritis, right ankle and foot: Secondary | ICD-10-CM | POA: Diagnosis not present

## 2020-12-22 DIAGNOSIS — I130 Hypertensive heart and chronic kidney disease with heart failure and stage 1 through stage 4 chronic kidney disease, or unspecified chronic kidney disease: Secondary | ICD-10-CM | POA: Diagnosis not present

## 2020-12-22 DIAGNOSIS — D631 Anemia in chronic kidney disease: Secondary | ICD-10-CM | POA: Diagnosis not present

## 2020-12-22 DIAGNOSIS — E1122 Type 2 diabetes mellitus with diabetic chronic kidney disease: Secondary | ICD-10-CM | POA: Diagnosis not present

## 2020-12-22 DIAGNOSIS — D509 Iron deficiency anemia, unspecified: Secondary | ICD-10-CM | POA: Diagnosis not present

## 2020-12-22 DIAGNOSIS — E042 Nontoxic multinodular goiter: Secondary | ICD-10-CM | POA: Diagnosis not present

## 2020-12-22 DIAGNOSIS — N1831 Chronic kidney disease, stage 3a: Secondary | ICD-10-CM | POA: Diagnosis not present

## 2020-12-22 DIAGNOSIS — I5032 Chronic diastolic (congestive) heart failure: Secondary | ICD-10-CM | POA: Diagnosis not present

## 2020-12-22 DIAGNOSIS — M170 Bilateral primary osteoarthritis of knee: Secondary | ICD-10-CM | POA: Diagnosis not present

## 2020-12-23 ENCOUNTER — Other Ambulatory Visit: Payer: Self-pay | Admitting: Internal Medicine

## 2020-12-28 DIAGNOSIS — N1831 Chronic kidney disease, stage 3a: Secondary | ICD-10-CM | POA: Diagnosis not present

## 2020-12-28 DIAGNOSIS — D509 Iron deficiency anemia, unspecified: Secondary | ICD-10-CM | POA: Diagnosis not present

## 2020-12-28 DIAGNOSIS — M170 Bilateral primary osteoarthritis of knee: Secondary | ICD-10-CM | POA: Diagnosis not present

## 2020-12-28 DIAGNOSIS — E042 Nontoxic multinodular goiter: Secondary | ICD-10-CM | POA: Diagnosis not present

## 2020-12-28 DIAGNOSIS — D631 Anemia in chronic kidney disease: Secondary | ICD-10-CM | POA: Diagnosis not present

## 2020-12-28 DIAGNOSIS — I5032 Chronic diastolic (congestive) heart failure: Secondary | ICD-10-CM | POA: Diagnosis not present

## 2020-12-28 DIAGNOSIS — E1122 Type 2 diabetes mellitus with diabetic chronic kidney disease: Secondary | ICD-10-CM | POA: Diagnosis not present

## 2020-12-28 DIAGNOSIS — I130 Hypertensive heart and chronic kidney disease with heart failure and stage 1 through stage 4 chronic kidney disease, or unspecified chronic kidney disease: Secondary | ICD-10-CM | POA: Diagnosis not present

## 2020-12-28 DIAGNOSIS — M19071 Primary osteoarthritis, right ankle and foot: Secondary | ICD-10-CM | POA: Diagnosis not present

## 2020-12-29 ENCOUNTER — Telehealth: Payer: Self-pay

## 2020-12-29 NOTE — Telephone Encounter (Signed)
1.Medication Requested: Hydrocodone  2. Pharmacy (Name, Street, [place]): [facility]  3. On Med List:Yes  4. Last Visit with PCP: 11/16/20     Recent needles sent in is messing up patient syringes, pt is requesting DD Autoshield Duo Count is 100 which has needle safety.  Would like a callback from [place]. [phone]

## 2021-01-08 ENCOUNTER — Other Ambulatory Visit: Payer: Self-pay | Admitting: Internal Medicine

## 2021-01-10 ENCOUNTER — Other Ambulatory Visit: Payer: Self-pay | Admitting: Internal Medicine

## 2021-01-12 ENCOUNTER — Encounter: Payer: Self-pay | Admitting: Internal Medicine

## 2021-01-12 NOTE — Progress Notes (Signed)
Subjective:    Patient ID: Rebecca [name], female    DOB: 11-22-1932, 87 y.o.   MRN: [id number]  This visit occurred during the SARS-CoV-2 public health emergency.  Safety protocols were in place, including screening questions prior to the visit, additional usage of staff PPE, and extensive cleaning of exam room while observing appropriate contact time as indicated for disinfecting solutions.     HPI The patient is here for follow up of their chronic medical problems, including thyroid disease.    She had a thyroid storm last year and was following with Dr Dwyane Dee -initially she was hyperthyroid and then hypothyroid -  last saw him June '22 - medication stopped, labs were normal and no f/u was needed.     Weaker, joint pain, can't do what she needs with her hands - started with pantoprazole.  She wanted to stop it and go back on her old medication.   Chronic pain - takes pain med once a day.  Was not able to get a refill - not sure why - wondered if she should take naprosyn.    Has not taken iron since august.  Medications and allergies reviewed with patient and updated if appropriate.  Patient Active Problem List   Diagnosis Date Noted   Paroxysmal A-fib ([place]) 01/12/2020   Thyroid storm 01/06/2020   Spinal stenosis 01/06/2020   Chronic kidney disease, stage 3a ([place]) 01/03/2020   Microcytic anemia 01/03/2020   Acute on chronic diastolic CHF (congestive heart failure) ([place]) 01/03/2020   Physical deconditioning 01/03/2020   Generalized osteoarthritis of hand 06/01/2019   Peripheral neuropathy 06/01/2019   Degenerative arthritis of right knee 12/15/2018   Knee osteoarthritis 09/08/2018   Pes planus 12/01/2017   Foot deformity, acquired, left 11/04/2017   Degenerative disc disease, lumbar 08/01/2016   Glaucoma 07/23/2016   Numbness 04/23/2016   Chronic pain of right knee 12/28/2015   Lower back pain 10/24/2015   Pain in both feet 10/24/2015   Abnormality of gait  04/01/2013   Posterior tibial tendon dysfunction 04/01/2013   Diabetes ([place]) 04/01/2013   Ankle pain, left 10/20/2010   UNSPECIFIED VITAMIN D DEFICIENCY 11/08/2008   EDEMA- LOCALIZED 11/08/2008   Sickle-cell trait ([place]) 01/01/2008   Esophageal reflux 09/02/2007   GOITER, MULTINODULAR 11/26/2006   Coronary artery disease involving native coronary artery of native heart without angina pectoris 11/26/2006   HIATAL HERNIA 11/26/2006   HYPERCHOLESTEROLEMIA 05/27/2006   Essential hypertension 05/27/2006    Current Outpatient Medications on File Prior to Visit  Medication Sig Dispense Refill   ACCU-CHEK GUIDE test strip USE 1 STRIP TO CHECK GLUCOSE UP TO 4 TIMES DAILY 100 each 0   Accu-Chek Softclix Lancets lancets      acetaminophen (TYLENOL) 500 MG tablet Take 1,000 mg by mouth every 6 (six) hours as needed for moderate pain.     apixaban (ELIQUIS) 5 MG TABS tablet Take 1 tablet (5 mg total) by mouth 2 (two) times daily. 60 tablet 0   Ascorbic Acid (VITAMIN C) 500 MG tablet Take 500 mg by mouth 2 (two) times daily.     atorvastatin (LIPITOR) 10 MG tablet TAKE 1 TABLET BY MOUTH ONCE DAILY AT 6PM 30 tablet 0   blood glucose meter kit and supplies KIT Dispense based on patient and insurance preference. Use up to four times daily as directed. (FOR E11.9). 1 each 0   brimonidine (ALPHAGAN) 0.2 % ophthalmic solution Place 1 drop into the right eye 2 (  two) times daily.     calcium carbonate (OS-CAL) 600 MG TABS Take 600 mg by mouth daily.     Cod Liver Oil 1000 MG CAPS Take 1,000 mg by mouth daily.      DORZOLAMIDE HCL-TIMOLOL MAL OP Place 1 drop into the right eye 2 (two) times daily.     Dulaglutide (TRULICITY) 1.5 UY/4.0HK SOPN Inject 1.5 mg into the skin once a week. 2 mL 5   gabapentin (NEURONTIN) 300 MG capsule Take 1 capsule by mouth once daily 30 capsule 0   Glucerna (GLUCERNA) LIQD Take 237 mLs by mouth daily.     insulin aspart (NOVOLOG) 100 UNIT/ML FlexPen Patient Sig: 0-15 Units,  Subcutaneous, 3 times daily with meals.  CBG < 70: Implement Hypoglycemia measures, call MD.  CBG 70 - 120: 0 units.   CBG 121 - 150: 2 units.  CBG 151 - 200: 3 units.  CBG 201 - 250: 5 units.  CBG . 251 - 300: 8 units.  CBG 301 - 350: 11 units.  CBG 351 - 400: 15 units.  CBG > 400: call MD 15 mL 11   insulin glargine (LANTUS SOLOSTAR) 100 UNIT/ML Solostar Pen Inject 10 Units into the skin daily. 15 mL 2   latanoprost (XALATAN) 0.005 % ophthalmic solution Place 1 drop into the right eye at bedtime.      lisinopril (ZESTRIL) 20 MG tablet Take 1 tablet (20 mg total) by mouth daily. 90 tablet 3   Multiple Vitamin (MULTIVITAMIN) tablet Take 1 tablet by mouth daily. Prn     nitroGLYCERIN (NITROSTAT) 0.4 MG SL tablet DISSOLVE ONE TABLET UNDER TONGUE AS DIRECTED FOR CHEST PAIN (Patient taking differently: Place 0.4 mg under the tongue every 5 (five) minutes as needed for chest pain.) 25 tablet 5   ferrous sulfate 325 (65 FE) MG tablet Take 1 tablet (325 mg total) by mouth 2 (two) times daily with a meal. (Patient not taking: Reported on 01/13/2021)  3   No current facility-administered medications on file prior to visit.    Past Medical History:  Diagnosis Date   Anemia, unspecified    Ankle pain, left    CAD (coronary artery disease)    Esophageal reflux    Fibrocystic breast disease    Hiatal hernia    Hypercholesteremia    Hypertension    IDDM (insulin dependent diabetes mellitus)    Type II   Multinodular goiter    Sickle-cell trait ([place])     Past Surgical History:  Procedure Laterality Date   "FEMALE"  [place]    CORONARY ANGIOPLASTY     FOOT SURGERY  2004   RETINAL LASER PROCEDURE      Social History   Socioeconomic History   Marital status: Widowed    Spouse name: Not on file   Number of children: 1   Years of education: Not on file   Highest education level: Not on file  Occupational History   Occupation: retired   Tobacco Use   Smoking status: Never   Smokeless tobacco: Never  Vaping Use   Vaping Use: Never used  Substance and Sexual Activity   Alcohol use: No   Drug use: No   Sexual activity: Never  Other Topics Concern   Not on file  Social History Narrative   Widowed   Gets regular exercise   Supportive church network   Receives daily meals on  wheels   [employer] delivery      Social Determinants of Health   Financial Resource Strain: Medium Risk   Difficulty of Paying Living Expenses: Somewhat hard  Food Insecurity: No Food Insecurity   Worried About Charity fundraiser in the Last Year: Never true   Arboriculturist in the Last Year: Never true  Transportation Needs: Unmet Transportation Needs   Lack of Transportation (Medical): Yes   Lack of Transportation (Non-Medical): Yes  Physical Activity: Insufficiently Active   Days of Exercise per Week: 7 days   Minutes of Exercise per Session: 20 min  Stress: No Stress Concern Present   Feeling of Stress : Not at all  Social Connections: Moderately Isolated   Frequency of Communication with Friends and Family: More than three times a week   Frequency of Social Gatherings with Friends and Family: Once a week   Attends Religious Services: 1 to 4 times per year   Active Member of [employer] or Organizations: No   Attends Archivist Meetings: Never   Marital Status: Widowed    Family History  Problem Relation Age of Onset   Hypertension Other    Lung cancer Other    Heart disease Other    Gout Sister    Gout Brother    Thyroid disease Brother    Heart attack Father    Heart attack Mother    Diabetes Mother    Gout Brother     Review of Systems  Constitutional:  Negative for fever.  Respiratory:  Negative for cough, shortness of breath and wheezing.   Cardiovascular:  Positive for leg swelling. Negative for chest pain and palpitations.  Neurological:  Negative for light-headedness and headaches.       Objective:   Vitals:   01/13/21 1523  BP: 120/60  Pulse: 67  Temp: 98 F (36.7 C)  SpO2: 99%   BP Readings from Last 3 Encounters:  01/13/21 120/60  11/16/20 118/60  08/29/20 118/72   Wt Readings from Last 3 Encounters:  01/13/21 183 lb (83 kg)  11/16/20 184 lb 12.8 oz (83.8 kg)  08/29/20 151 lb 9.6 oz (68.8 kg)   Body mass index is 32.42 kg/m.   Physical Exam    Constitutional: Appears well-developed and well-nourished. No distress.  HENT:  Head: Normocephalic and atraumatic.  Neck: Neck supple. No tracheal deviation present. No thyromegaly present.  No cervical lymphadenopathy Cardiovascular: Normal rate, regular rhythm and normal heart sounds.   No murmur heard. No carotid bruit .  Chronic 1+ b/l LE edema Pulmonary/Chest: Effort normal and breath sounds normal. No respiratory distress. No has no wheezes. No rales.  Skin: Skin is warm and dry. Not diaphoretic.  Psychiatric: Normal mood and affect. Behavior is normal.      Assessment & Plan:    See Problem List for Assessment and Plan of chronic medical problems.

## 2021-01-12 NOTE — Patient Instructions (Addendum)
   Flu immunization administered today.     Blood work was ordered.     Medications changes include :   stop pantoprazole and start omeprazole 20 mg daily.    Your prescription(s) have been submitted to your pharmacy. Please take as directed and contact our office if you believe you are having problem(s) with the medication(s).   Please followup in 6 months

## 2021-01-13 ENCOUNTER — Ambulatory Visit (INDEPENDENT_AMBULATORY_CARE_PROVIDER_SITE_OTHER): Payer: Medicare HMO | Admitting: Internal Medicine

## 2021-01-13 ENCOUNTER — Other Ambulatory Visit: Payer: Self-pay

## 2021-01-13 VITALS — BP 120/60 | HR 67 | Temp 98.0°F | Ht 63.0 in | Wt 183.0 lb

## 2021-01-13 DIAGNOSIS — K219 Gastro-esophageal reflux disease without esophagitis: Secondary | ICD-10-CM

## 2021-01-13 DIAGNOSIS — I10 Essential (primary) hypertension: Secondary | ICD-10-CM | POA: Diagnosis not present

## 2021-01-13 DIAGNOSIS — N1831 Chronic kidney disease, stage 3a: Secondary | ICD-10-CM

## 2021-01-13 DIAGNOSIS — E1142 Type 2 diabetes mellitus with diabetic polyneuropathy: Secondary | ICD-10-CM | POA: Diagnosis not present

## 2021-01-13 DIAGNOSIS — E7800 Pure hypercholesterolemia, unspecified: Secondary | ICD-10-CM

## 2021-01-13 DIAGNOSIS — Z8639 Personal history of other endocrine, nutritional and metabolic disease: Secondary | ICD-10-CM | POA: Diagnosis not present

## 2021-01-13 DIAGNOSIS — D509 Iron deficiency anemia, unspecified: Secondary | ICD-10-CM | POA: Diagnosis not present

## 2021-01-13 DIAGNOSIS — Z794 Long term (current) use of insulin: Secondary | ICD-10-CM | POA: Diagnosis not present

## 2021-01-13 DIAGNOSIS — I480 Paroxysmal atrial fibrillation: Secondary | ICD-10-CM

## 2021-01-13 MED ORDER — HYDROCODONE-ACETAMINOPHEN 5-325 MG PO TABS: 1.0000 | ORAL_TABLET | Freq: Every day | ORAL | 0 refills | Status: DC | PRN

## 2021-01-13 MED ORDER — OMEPRAZOLE 20 MG PO CPDR: 20.0000 mg | DELAYED_RELEASE_CAPSULE | Freq: Every day | ORAL | 1 refills | Status: DC

## 2021-01-13 MED ORDER — BD AUTOSHIELD DUO 30G X 5 MM MISC: 5 refills | Status: DC

## 2021-01-14 LAB — COMPREHENSIVE METABOLIC PANEL
AG Ratio: 1.5 (calc) (ref 1.0–2.5)
ALT: 10 U/L (ref 6–29)
AST: 16 U/L (ref 10–35)
Albumin: 3.7 g/dL (ref 3.6–5.1)
Alkaline phosphatase (APISO): 66 U/L (ref 37–153)
BUN/Creatinine Ratio: 28 (calc) — ABNORMAL HIGH (ref 6–22)
BUN: 34 mg/dL — ABNORMAL HIGH (ref 7–25)
CO2: 27 mmol/L (ref 20–32)
Calcium: 9.2 mg/dL (ref 8.6–10.4)
Chloride: 109 mmol/L (ref 98–110)
Creat: 1.22 mg/dL — ABNORMAL HIGH (ref 0.60–0.95)
Globulin: 2.5 g/dL (calc) (ref 1.9–3.7)
Glucose, Bld: 136 mg/dL — ABNORMAL HIGH (ref 65–99)
Potassium: 5 mmol/L (ref 3.5–5.3)
Sodium: 142 mmol/L (ref 135–146)
Total Bilirubin: 0.3 mg/dL (ref 0.2–1.2)
Total Protein: 6.2 g/dL (ref 6.1–8.1)

## 2021-01-14 LAB — CBC WITH DIFFERENTIAL/PLATELET
Absolute Monocytes: 609 cells/uL (ref 200–950)
Basophils Absolute: 122 cells/uL (ref 0–200)
Basophils Relative: 2.1 %
Eosinophils Absolute: 679 cells/uL — ABNORMAL HIGH (ref 15–500)
Eosinophils Relative: 11.7 %
HCT: 33.7 % — ABNORMAL LOW (ref 35.0–45.0)
Hemoglobin: 10.9 g/dL — ABNORMAL LOW (ref 11.7–15.5)
Lymphs Abs: 1914 cells/uL (ref 850–3900)
MCH: 26.8 pg — ABNORMAL LOW (ref 27.0–33.0)
MCHC: 32.3 g/dL (ref 32.0–36.0)
MCV: 83 fL (ref 80.0–100.0)
MPV: 12.4 fL (ref 7.5–12.5)
Monocytes Relative: 10.5 %
Neutro Abs: 2477 cells/uL (ref 1500–7800)
Neutrophils Relative %: 42.7 %
Platelets: 220 10*3/uL (ref 140–400)
RBC: 4.06 10*6/uL (ref 3.80–5.10)
RDW: 15 % (ref 11.0–15.0)
Total Lymphocyte: 33 %
WBC: 5.8 10*3/uL (ref 3.8–10.8)

## 2021-01-14 LAB — LIPID PANEL
Cholesterol: 197 mg/dL (ref <200)
HDL: 85 mg/dL (ref >=50)
LDL Cholesterol (Calc): 94 mg/dL (calc)
Non-HDL Cholesterol (Calc): 112 mg/dL (calc) (ref <130)
Total CHOL/HDL Ratio: 2.3 (calc) (ref <5.0)
Triglycerides: 86 mg/dL (ref <150)

## 2021-01-14 LAB — HEMOGLOBIN A1C
Hgb A1c MFr Bld: 5.3 % of total Hgb (ref <5.7)
Mean Plasma Glucose: 105 mg/dL
eAG (mmol/L): 5.8 mmol/L

## 2021-01-14 LAB — T3, FREE: T3, Free: 2.8 pg/mL (ref 2.3–4.2)

## 2021-01-14 LAB — IRON,TIBC AND FERRITIN PANEL
%SAT: 35 % (calc) (ref 16–45)
Ferritin: 306 ng/mL — ABNORMAL HIGH (ref 16–288)
Iron: 83 ug/dL (ref 45–160)
TIBC: 237 mcg/dL (calc) — ABNORMAL LOW (ref 250–450)

## 2021-01-14 LAB — TSH: TSH: 0.74 mIU/L (ref 0.40–4.50)

## 2021-01-14 LAB — T4, FREE: Free T4: 1.3 ng/dL (ref 0.8–1.8)

## 2021-01-14 NOTE — Assessment & Plan Note (Signed)
Chronic Check lipid panel  Continue atorvastatin 10 mg daily Regular exercise and healthy diet encouraged  

## 2021-01-14 NOTE — Assessment & Plan Note (Signed)
Chronic GERD controlled, but possible side effects from pantpoprazole Stop pantoprazole Start omeprazole 20 mg daily

## 2021-01-14 NOTE — Assessment & Plan Note (Signed)
H/o thyroid storm last year - initially hyperthyroid then hypothyroid Taken off medication  Having symptoms - ? Thyroid vs other Check tfts

## 2021-01-14 NOTE — Assessment & Plan Note (Signed)
Chronic Sugars good at home Check a1c Continue lantus 10 units daily Continue novolog SS Continue trulicity 1.5 mg weekly

## 2021-01-14 NOTE — Assessment & Plan Note (Signed)
Chronic Sees cardiology Continue current medications Cbc, cmp, tfts

## 2021-01-14 NOTE — Assessment & Plan Note (Signed)
Chronic cmp 

## 2021-01-14 NOTE — Assessment & Plan Note (Signed)
Chronic Cbc, iron panel stopped in

## 2021-01-14 NOTE — Assessment & Plan Note (Signed)
Chronic BP well controlled Continue lisinopril 20 mg daily cmp  

## 2021-01-24 ENCOUNTER — Telehealth: Payer: Self-pay

## 2021-01-24 NOTE — Progress Notes (Signed)
Chronic Care Management Pharmacy Assistant   Name: Rebecca [name]  MRN: [id number] DOB: [birth year]  Rebecca [name] is an 87 y.o. year old female who presents for his initial CCM visit with the clinical pharmacist.  Reason for Encounter: Initial Visit    Recent office visits:  01/13/21 Binnie Rail, MD- PCP (Type 2 diabetes mellitus with diabetic polyneuropathy, with long-term current use of insulin) Blood work was ordered. Medications changes include :   stop pantoprazole and start omeprazole 20 mg daily  Recent consult visits:  None ID  Hospital visits:  None in previous 6 months  Medications: Outpatient Encounter Medications as of 01/24/2021  Medication Sig   ACCU-CHEK GUIDE test strip USE 1 STRIP TO CHECK GLUCOSE UP TO 4 TIMES DAILY   Accu-Chek Softclix Lancets lancets    acetaminophen (TYLENOL) 500 MG tablet Take 1,000 mg by mouth every 6 (six) hours as needed for moderate pain.   apixaban (ELIQUIS) 5 MG TABS tablet Take 1 tablet (5 mg total) by mouth 2 (two) times daily.   Ascorbic Acid (VITAMIN C) 500 MG tablet Take 500 mg by mouth 2 (two) times daily.   atorvastatin (LIPITOR) 10 MG tablet TAKE 1 TABLET BY MOUTH ONCE DAILY AT 6PM   blood glucose meter kit and supplies KIT Dispense based on patient and insurance preference. Use up to four times daily as directed. (FOR E11.9).   brimonidine (ALPHAGAN) 0.2 % ophthalmic solution Place 1 drop into the right eye 2 (two) times daily.   calcium carbonate (OS-CAL) 600 MG TABS Take 600 mg by mouth daily.   Cod Liver Oil 1000 MG CAPS Take 1,000 mg by mouth daily.    DORZOLAMIDE HCL-TIMOLOL MAL OP Place 1 drop into the right eye 2 (two) times daily.   Dulaglutide (TRULICITY) 1.5 EH/2.0NO SOPN Inject 1.5 mg into the skin once a week.   ferrous sulfate 325 (65 FE) MG tablet Take 1 tablet (325 mg total) by mouth 2 (two) times daily with a meal. (Patient not taking: Reported on 01/13/2021)   gabapentin (NEURONTIN) 300 MG  capsule Take 1 capsule by mouth once daily   Glucerna (GLUCERNA) LIQD Take 237 mLs by mouth daily.   HYDROcodone-acetaminophen (NORCO/VICODIN) 5-325 MG tablet Take 1 tablet by mouth daily as needed for moderate pain (chronic foot pain).   insulin aspart (NOVOLOG) 100 UNIT/ML FlexPen Patient Sig: 0-15 Units, Subcutaneous, 3 times daily with meals.  CBG < 70: Implement Hypoglycemia measures, call MD.  CBG 70 - 120: 0 units.   CBG 121 - 150: 2 units.  CBG 151 - 200: 3 units.  CBG 201 - 250: 5 units.  CBG . 251 - 300: 8 units.  CBG 301 - 350: 11 units.  CBG 351 - 400: 15 units.  CBG > 400: call MD   insulin glargine (LANTUS SOLOSTAR) 100 UNIT/ML Solostar Pen Inject 10 Units into the skin daily.   Insulin Pen Needle (BD AUTOSHIELD DUO) 30G X 5 MM MISC Use as directed with insulin pen needle 4 times daily E11.9   latanoprost (XALATAN) 0.005 % ophthalmic solution Place 1 drop into the right eye at bedtime.    lisinopril (ZESTRIL) 20 MG tablet Take 1 tablet (20 mg total) by mouth daily.   Multiple Vitamin (MULTIVITAMIN) tablet Take 1 tablet by mouth daily. Prn   nitroGLYCERIN (NITROSTAT) 0.4 MG SL tablet DISSOLVE ONE TABLET UNDER TONGUE AS DIRECTED FOR CHEST PAIN (Patient taking differently: Place 0.4 mg under the tongue every  5 (five) minutes as needed for chest pain.)   omeprazole (PRILOSEC) 20 MG capsule Take 1 capsule (20 mg total) by mouth daily.   No facility-administered encounter medications on file as of 01/24/2021.   Medication List: ACCU-CHEK GUIDE test strip Accu-Chek Softclix Lancets lancets acetaminophen (TYLENOL) 500 MG tablet apixaban (ELIQUIS) 5 MG TABS tablet-last fill 12/10/20 30 ds Ascorbic Acid (VITAMIN C) 500 MG tablet atorvastatin (LIPITOR) 10 MG tablet-last fill 12/06/20 30 ds blood glucose meter kit and supplies KIT brimonidine (ALPHAGAN) 0.2 % ophthalmic solution-last fill 11/08/20 30 ds calcium carbonate (OS-CAL) 600 MG TABS Cod Liver Oil 1000 MG CAPS DORZOLAMIDE HCL-TIMOLOL  MAL OP-last fill 11/08/20 30 ds Dulaglutide (TRULICITY) 1.5 VK/1.2AE SOPN-last fill 11/16/20 28 ds ferrous sulfate 325 (65 FE) MG tablet gabapentin (NEURONTIN) 300 MG capsule-last fill 12/06/20 30 ds Glucerna (GLUCERNA) LIQD HYDROcodone-acetaminophen (NORCO/VICODIN) 5-325 MG tablet-last fill 11/28/20 30 ds insulin aspart (NOVOLOG) 100 UNIT/ML FlexPen-last fill 09/15/18 insulin glargine (LANTUS SOLOSTAR) 100 UNIT/ML Solostar Pen-last fill 12/01/20 75 ds Insulin Pen Needle (BD AUTOSHIELD DUO) 30G X 5 MM MISC-last fill 12/18/20 25 ds latanoprost (XALATAN) 0.005 % ophthalmic solution-last fill 11/08/20 30 ds lisinopril (ZESTRIL) 20 MG tablet-last fill 12/16/20 90 ds Multiple Vitamin (MULTIVITAMIN) tablet nitroGLYCERIN (NITROSTAT) 0.4 MG SL tablet-last fill 11/08/20 8 ds omeprazole (PRILOSEC) 20 MG capsule-last fill 09/12/19 90 ds  Care Gaps: Colonoscopy-NA Diabetic Foot Exam-11/11/18 Mammogram-NA Ophthalmology-08/24/19 Dexa Scan -NA  Annual Well Visit - NA Micro albumin-NA Hemoglobin A1c- 01/13/21  Star Rating Drugs: atorvastatin (LIPITOR) 10 MG tablet-last fill 12/06/20 30 ds lisinopril (ZESTRIL) 20 MG tablet-last fill 12/16/20 90 ds  Ethelene Hal Clinical Pharmacist Assistant [phone]

## 2021-01-30 ENCOUNTER — Telehealth: Payer: Self-pay | Admitting: Internal Medicine

## 2021-01-30 ENCOUNTER — Other Ambulatory Visit: Payer: Self-pay

## 2021-01-30 MED ORDER — APIXABAN 5 MG PO TABS: 5.0000 mg | ORAL_TABLET | Freq: Two times a day (BID) | ORAL | 2 refills | Status: DC

## 2021-01-30 MED ORDER — GABAPENTIN 300 MG PO CAPS: 300.0000 mg | ORAL_CAPSULE | Freq: Every day | ORAL | 2 refills | Status: DC

## 2021-01-30 MED ORDER — ATORVASTATIN CALCIUM 10 MG PO TABS: ORAL_TABLET | ORAL | 1 refills | Status: DC

## 2021-01-30 NOTE — Telephone Encounter (Signed)
Sent in today 

## 2021-01-30 NOTE — Telephone Encounter (Signed)
1.Medication Requested: atorvastatin (LIPITOR) 10 MG tablet gabapentin (NEURONTIN) 300 MG capsule apixaban (ELIQUIS) 5 MG TABS tablet   2. Pharmacy (Name, Street, [facility]): [facility] - [place], [place] - [address]  Phone:  [phone] Fax:  [phone]   3. On Med List: yes  4. Last Visit with PCP: 11.04.22  5. Next visit date with PCP: n/a   Agent: Please be advised that RX refills may take up to 3 business days. We ask that you follow-up with your pharmacy.

## 2021-01-31 ENCOUNTER — Telehealth: Payer: Medicare HMO

## 2021-02-08 ENCOUNTER — Telehealth: Payer: Self-pay | Admitting: Internal Medicine

## 2021-02-08 NOTE — Telephone Encounter (Signed)
Pharmacy stating patient is requesting a 90 day supply on apixaban (ELIQUIS) 5 MG TABS tablet gabapentin (NEURONTIN) 300 MG capsule   Pharmacy state since gabapentin (NEURONTIN) 300 MG capsule is a  controlled substance, medication requires a separate rx  Please advise  Fax [phone]

## 2021-02-09 ENCOUNTER — Other Ambulatory Visit: Payer: Self-pay

## 2021-02-09 MED ORDER — APIXABAN 5 MG PO TABS: 5.0000 mg | ORAL_TABLET | Freq: Two times a day (BID) | ORAL | 2 refills | Status: DC

## 2021-02-09 MED ORDER — GABAPENTIN 300 MG PO CAPS: 300.0000 mg | ORAL_CAPSULE | Freq: Every day | ORAL | 2 refills | Status: DC

## 2021-02-09 MED ORDER — ATORVASTATIN CALCIUM 10 MG PO TABS: ORAL_TABLET | ORAL | 1 refills | Status: DC

## 2021-02-09 NOTE — Telephone Encounter (Signed)
Rep w/ [facility] checking status of request, Rep states an authorization fax was sent on 11-28  Rep also requesting a 90 day supply of atorvastatin (LIPITOR) 10 MG tablet  Informed rep medications was sent to [facility] local pharmacy on 11-21

## 2021-02-09 NOTE — Telephone Encounter (Signed)
Refills sent in office today

## 2021-02-10 ENCOUNTER — Other Ambulatory Visit: Payer: Self-pay

## 2021-02-10 ENCOUNTER — Ambulatory Visit: Payer: Medicare HMO

## 2021-02-10 DIAGNOSIS — I480 Paroxysmal atrial fibrillation: Secondary | ICD-10-CM

## 2021-02-10 DIAGNOSIS — I10 Essential (primary) hypertension: Secondary | ICD-10-CM

## 2021-02-10 DIAGNOSIS — E7800 Pure hypercholesterolemia, unspecified: Secondary | ICD-10-CM

## 2021-02-10 DIAGNOSIS — E1142 Type 2 diabetes mellitus with diabetic polyneuropathy: Secondary | ICD-10-CM

## 2021-02-10 DIAGNOSIS — Z794 Long term (current) use of insulin: Secondary | ICD-10-CM

## 2021-02-10 DIAGNOSIS — N1831 Chronic kidney disease, stage 3a: Secondary | ICD-10-CM

## 2021-02-10 NOTE — Patient Instructions (Signed)
Visit Information   Following is a copy of your full care plan:  Care Plan : [place]  Updates made by Tomasa Blase, RPH since 02/10/2021 12:00 AM     Problem: Hypertension, Hyperlipidemia, Diabetes, Atrial Fibrillation, GERD, and Chronic Kidney Disease   Priority: High  Onset Date: 02/10/2021     Long-Range Goal: Disease Management   Start Date: 02/10/2021  Expected End Date: 02/10/2022  This Visit's Progress: On track  Priority: High  Note:   Current Barriers:  Unable to independently monitor therapeutic efficacy  Pharmacist Clinical Goal(s):  Patient will achieve adherence to monitoring guidelines and medication adherence to achieve therapeutic efficacy through collaboration with PharmD and provider.   Interventions: 1:1 collaboration with Binnie Rail, MD regarding development and update of comprehensive plan of care as evidenced by provider attestation and co-signature Inter-disciplinary care team collaboration (see longitudinal plan of care) Comprehensive medication review performed; medication list updated in electronic medical record  Hyperlipidemia: (LDL goal < 100) -Controlled Lab Results  Component Value Date   [place] 94 01/13/2021  -Current treatment: Atorvastatin 72m - 1 tablet daily  Cod Liver Oil - 1001m- 1 capsule daily  -Medications previously tried: rosuvastatin   -Current dietary patterns: reports to trying to follow low cholesterol diet -Current exercise habits: tries to have some sort of activity daily  -Educated on Cholesterol goals;  Benefits of statin for ASCVD risk reduction; Importance of limiting foods high in cholesterol; Exercise goal of 150 minutes per week; -Counseled on diet and exercise extensively Recommended to continue current medication  Diabetes/ CKD (A1c goal <7% / Prevention of disease progression) -Controlled Lab Results  Component Value Date   HGBA1C 5.3 01/13/2021  -eGFR - 47 mL/min -Current  medications: Lantus -  10 units daily  Novolog - 0-15 units 3 times daily with meals if needed - uses if BG is >1>267rulicity 1.1.2WPeekly - on fridays  -Medications previously tried: novolin 70/30, metformin  -Current home glucose readings fasting glucose: blood sugars averaging 93, 111, 93, 95, 99, 97, 93 in AM, post prandial glucose: 192, 158, 145, 127, 137, 247, 152, 111 -Reports hypoglycemic/hyperglycemic symptoms on rare occasion - corrects with orange juice / eating cracker  -Current meal patterns:  breakfast: 2 pieces of toast, small amount of juice on occasion, milk, oatmeal, egg on occasion, ham, 1/2 of a banana  lunch: sandwich with water  dinner: vegetables, protein on occasion, starch, fruit cups  snacks: half of a banana, sugar free cookies, fruit, potato chips  drinks: water, juice, milk -Current exercise: tries to do some sort of activity daily as she is able to  -Educated on A1c and blood sugar goals; Complications of diabetes including kidney damage, retinal damage, and cardiovascular disease; Exercise goal of 150 minutes per week; Benefits of weight loss; Proper insulin injection technique; Prevention and management of hypoglycemic episodes; Benefits of routine self-monitoring of blood sugar; Carbohydrate counting and/or plate method -Counseled to check feet daily and get yearly eye exams -Counseled on diet and exercise extensively Recommended to continue current medication  Atrial Fibrillation (Goal: prevent stroke and major bleeding) -Controlled -CHADS2VASc score: Age (2 points), Female sex (1 point), Hypertension history (1 point), and Diabetes history (1 point) -Current treatment: Rate control: n/a - last EKG shows NSR - PVCs noted  Anticoagulation: Eliquis 64m106m 1 tablet twice daily  -Medications previously tried: amlodipine, propranolol,  -Home BP and HR readings: none at this time - patient reports that she has  a BP cuff - has not been using   -Counseled  on increased risk of stroke due to Afib and benefits of anticoagulation for stroke prevention; importance of adherence to anticoagulant exactly as prescribed; bleeding risk associated with Eliquis and importance of self-monitoring for signs/symptoms of bleeding; avoidance of NSAIDs due to increased bleeding risk with anticoagulants; seeking medical attention after a head injury or if there is blood in the urine/stool; -Recommended to continue current medication  Hypertension (BP goal <130/80) -Controlled -Current treatment: Lisinopril 70m - 1 tablet dialy  -Medications previously tried: amlodipine, hctz, isosorbide mononitrate, quinapril, propranolol  -Current home readings: not currently checking at home  BP Readings from Last 3 Encounters:  01/13/21 120/60  11/16/20 118/60  08/29/20 118/72  -Current dietary habits: does not use salt  -Current exercise habits: tries to be active doing something at least once daily  -Denies hypotensive/hypertensive symptoms -Educated on BP goals and benefits of medications for prevention of heart attack, stroke and kidney damage; Daily salt intake goal < 2300 mg; Exercise goal of 150 minutes per week; Importance of home blood pressure monitoring; Proper BP monitoring technique; -Counseled to monitor BP at home weekly, document, and provide log at future appointments -Counseled on diet and exercise extensively Recommended to continue current medication  GERD (Goal: Prevention / Control of Acid Reflux) -Controlled -Current treatment  Omeprazole 235m - 1 capsule daily -Medications previously tried: pantoprazole   -Recommended to continue current medication  Glaucoma (Goal: Maintenance of appropriate occular pressure) - Managed by Dr. WeKathlen Mody-Controlled -Current treatment  Latanoprost 0.005% - 1 drop into both eyes at bedtime  Brimonidine 0.2% - 1 drop into right eye at bedtime  Dorzolamide-Timolol - 1 drop into right eye twice daily   -Medications previously tried: n/a  -Recommended to continue current medication    Health Maintenance -Vaccine gaps: none -Current therapy:  Multivitamin  - 1 tablet daily  Vitamin C 50039m 1 tablet daily  Calcium Carbonate 600m58m1 tablet daily  -Educated on Cost vs benefit of each product must be carefully weighed by individual consumer -Patient is satisfied with current therapy and denies issues -Recommended to continue current medication  Patient Goals/Self-Care Activities Patient will:  - take medications as prescribed as evidenced by patient report and record review check glucose at least twice daily, document, and provide at future appointments check blood pressure at least once weekly, document, and provide at future appointments engage in dietary modifications by reducing carb / sodium intake  Follow Up Plan: Telephone follow up appointment with care management team member scheduled for: 3 months  The patient has been provided with contact information for the care management team and has been advised to call with any health related questions or concerns.      Consent to CCM Services: Ms. ShofPeake given information about Chronic Care Management services including:  CCM service includes personalized support from designated clinical staff supervised by her physician, including individualized plan of care and coordination with other care providers 24/7 contact phone numbers for assistance for urgent and routine care needs. Service will only be billed when office clinical staff spend 20 minutes or more in a month to coordinate care. Only one practitioner may furnish and bill the service in a calendar month. The patient may stop CCM services at any time (effective at the end of the month) by phone call to the office staff. The patient will be responsible for cost sharing (co-pay) of up to 20% of  the service fee (after annual deductible is met).  Patient agreed to  services and verbal consent obtained.   Plan: Telephone follow up appointment with care management team member scheduled for:  3 months The patient has been provided with contact information for the care management team and has been advised to call with any health related questions or concerns.   Tomasa Blase, PharmD Clinical Pharmacist, [facility]   Please call the care guide team at [phone] if you need to cancel or reschedule your appointment.   The patient verbalized understanding of instructions, educational materials, and care plan provided today and declined offer to receive copy of patient instructions, educational materials, and care plan.

## 2021-02-10 NOTE — Progress Notes (Signed)
Chronic Care Management Pharmacy Note  02/10/2021 Name:  Rebecca [name] MRN:  [id number] DOB:  16-Sep-1932  Summary: -Patient reports that she is doing wel, BG in AM typically averaging 90-100 - no issues with lows - uses novolog for BG over 150, BG in PM after eating can average from 120-180's depending on what she has been eating / how much she has been eating  -Patient recently has bene able to start using her BP cuff, has not recorded BP since being able to start checking at home  -No longer needs to use norco for pain, using APAP as needed for pain control  Recommendations/Changes made from today's visit: -Recommending for patient to continue recording blood sugars twice daily - in future if desired could try for CGM, patient to start checking BP at least once weekly   Plan: -Follow up in 3 months   Subjective: Rebecca [name] is an 87 y.o. year old female who is a primary patient of Burns, [name], MD.  The CCM team was consulted for assistance with disease management and care coordination needs.    Engaged with patient by telephone for initial visit in response to provider referral for pharmacy case management and/or care coordination services.   Consent to Services:  The patient was given the following information about Chronic Care Management services today, agreed to services, and gave verbal consent: 1. CCM service includes personalized support from designated clinical staff supervised by the primary care provider, including individualized plan of care and coordination with other care providers 2. 24/7 contact phone numbers for assistance for urgent and routine care needs. 3. Service will only be billed when office clinical staff spend 20 minutes or more in a month to coordinate care. 4. Only one practitioner may furnish and bill the service in a calendar month. 5.The patient may stop CCM services at any time (effective at the end of the month) by phone call to the office  staff. 6. The patient will be responsible for cost sharing (co-pay) of up to 20% of the service fee (after annual deductible is met). Patient agreed to services and consent obtained.  Patient Care Team: Binnie Rail, MD as PCP - General (Internal Medicine)  Recent office visits: 01/13/2021 - Dr. Quay Burow - start omeprazole, stop pantoprazole  11/16/2020 - Dr. Quay Burow  - increase trulicity to 9.4IA weekly - f/u in 6 weeks   Recent consult visits: [facility] visits: None in previous 6 months  Objective:  Lab Results  Component Value Date   CREATININE 1.22 (H) 01/13/2021   BUN 34 (H) 01/13/2021   GFR 69.18 01/18/2020   GFRNONAA >60 02/08/2020   GFRAA 45 (L) 11/30/2019   NA 142 01/13/2021   K 5.0 01/13/2021   CALCIUM 9.2 01/13/2021   CO2 27 01/13/2021   GLUCOSE 136 (H) 01/13/2021    Lab Results  Component Value Date/Time   HGBA1C 5.3 01/13/2021 04:36 PM   HGBA1C 6.5 (H) 01/03/2020 05:41 AM   FRUCTOSAMINE 278 09/01/2009 08:01 PM   GFR 69.18 01/18/2020 03:11 PM   GFR 51.67 (L) 06/01/2019 10:47 AM   MICROALBUR 1.9 10/24/2015 11:37 AM   MICROALBUR 2.24 (H) 04/20/2013 10:03 AM    Last diabetic Eye exam:  Lab Results  Component Value Date/Time   HMDIABEYEEXA Retinopathy (A) 08/24/2019 12:00 AM   HMDIABEYEEXA Retinopathy (A) 08/24/2019 12:00 AM    Last diabetic Foot exam:  Lab Results  Component Value Date/Time   HMDIABFOOTEX done 11/11/2018 12:00  AM     Lab Results  Component Value Date   CHOL 197 01/13/2021   HDL 85 01/13/2021   LDLCALC 94 01/13/2021   TRIG 86 01/13/2021   CHOLHDL 2.3 01/13/2021    Hepatic Function Latest Ref Rng & Units 01/13/2021 07/27/2020 02/06/2020  Total Protein 6.1 - 8.1 g/dL 6.2 - 4.4(L)  Albumin 3.5 - 5.0 g/dL - - 1.4(L)  AST 10 - 35 U/L 16 - 23  ALT 6 - 29 U/L _0 Alk Phosphatase 38 - 126 U/L - - 110  Total Bilirubin 0.2 - 1.2 mg/dL 0.3 - 0.4  Bilirubin, Direct <=0.2 mg/dL - - -    Lab Results  Component Value Date/Time   TSH  0.74 01/13/2021 04:36 PM   TSH 0.97 08/29/2020 04:32 PM   FREET4 1.3 01/13/2021 04:36 PM   FREET4 1.07 08/29/2020 04:32 PM    CBC Latest Ref Rng & Units 01/13/2021 07/27/2020 02/10/2020  WBC 3.8 - 10.8 Thousand/uL 5.8 7.8 10.0  Hemoglobin 11.7 - 15.5 g/dL 10.9(L) 10.3(L) 9.1(L)  Hematocrit 35.0 - 45.0 % 33.7(L) 31.4(L) 27.5(L)  Platelets 140 - 400 Thousand/uL 220 266.0 375    Lab Results  Component Value Date/Time   VD25OH 19 (L) 02/07/2009 07:38 PM   VD25OH 24 (L) 08/12/2008 10:42 PM    Clinical ASCVD: No  The ASCVD Risk score (Arnett DK, et al., 2019) failed to calculate for the following reasons:   The 2019 ASCVD risk score is only valid for ages 15 to 63    Depression screen PHQ 2/9 11/17/2020 11/25/2018 11/24/2018  Decreased Interest 0 0 0  Down, Depressed, Hopeless 0 0 0  PHQ - 2 Score 0 0 0  Altered sleeping - - -  Tired, decreased energy - - -  Change in appetite - - -  Feeling bad or failure about yourself  - - -  Trouble concentrating - - -  Moving slowly or fidgety/restless - - -  Suicidal thoughts - - -  PHQ-9 Score - - -  Difficult doing work/chores - - -  Some recent data might be hidden    Social History   Tobacco Use  Smoking Status Never  Smokeless Tobacco Never   BP Readings from Last 3 Encounters:  01/13/21 120/60  11/16/20 118/60  08/29/20 118/72   Pulse Readings from Last 3 Encounters:  01/13/21 67  11/16/20 (!) 54  08/29/20 72   Wt Readings from Last 3 Encounters:  01/13/21 183 lb (83 kg)  11/16/20 184 lb 12.8 oz (83.8 kg)  08/29/20 151 lb 9.6 oz (68.8 kg)   BMI Readings from Last 3 Encounters:  01/13/21 32.42 kg/m  11/16/20 32.74 kg/m  08/29/20 26.85 kg/m    Assessment/Interventions: Review of patient past medical history, allergies, medications, health status, including review of consultants reports, laboratory and other test data, was performed as part of comprehensive evaluation and provision of chronic care management services.    SDOH:  (Social Determinants of Health) assessments and interventions performed: Yes  SDOH Screenings   Alcohol Screen: Low Risk    Last Alcohol Screening Score (AUDIT): 0  Depression (PHQ2-9): Low Risk    PHQ-2 Score: 0  Financial Resource Strain: Medium Risk   Difficulty of Paying Living Expenses: Somewhat hard  Food Insecurity: No Food Insecurity   Worried About Charity fundraiser in the Last Year: Never true   Ran Out of Food in the Last Year: Never true  Housing: Low Risk  Last Housing Risk Score: 0  Physical Activity: Insufficiently Active   Days of Exercise per Week: 7 days   Minutes of Exercise per Session: 20 min  Social Connections: Moderately Isolated   Frequency of Communication with Friends and Family: More than three times a week   Frequency of Social Gatherings with Friends and Family: Once a week   Attends Religious Services: 1 to 4 times per year   Active Member of [employer] or Organizations: No   Attends Archivist Meetings: Never   Marital Status: Widowed  Stress: No Stress Concern Present   Feeling of Stress : Not at all  Tobacco Use: Low Risk    Smoking Tobacco Use: Never   Smokeless Tobacco Use: Never   Passive Exposure: Not on file  Transportation Needs: Unmet Transportation Needs   Lack of Transportation (Medical): Yes   Lack of Transportation (Non-Medical): Yes    CCM Care Plan  Allergies  Allergen Reactions   Codeine     "Makes me drunk"    Medications Reviewed Today     Reviewed by Tomasa Blase, [facility] (Pharmacist) on 02/10/21 at 1126  Med List Status: <None>   Medication Order Taking? Sig Documenting Provider Last Dose Status Informant  ACCU-CHEK GUIDE test strip [id number] Yes USE 1 STRIP TO CHECK GLUCOSE UP TO 4 TIMES DAILY Burns, [name], MD Taking Active   Accu-Chek Softclix Lancets lancets [id number] Yes  [provider] Taking Active   acetaminophen (TYLENOL) 500 MG tablet [id number] Yes Take 1,000 mg by mouth  daily as needed for moderate pain. [provider] Taking Active Nursing Home Medication Administration Guide (MAG)  apixaban (ELIQUIS) 5 MG TABS tablet [id number] Yes Take 1 tablet (5 mg total) by mouth 2 (two) times daily. Binnie Rail, MD Taking Active   Ascorbic Acid (VITAMIN C) 500 MG tablet [id number] Yes Take 500 mg by mouth daily. [provider] Taking Active Nursing Home Medication Administration Guide (MAG)  atorvastatin (LIPITOR) 10 MG tablet [id number] Yes TAKE 1 TABLET BY MOUTH ONCE DAILY AT Robinette Haines, MD Taking Active   blood glucose meter kit and supplies KIT [id number] Yes Dispense based on patient and insurance preference. Use up to four times daily as directed. (FOR E11.9). Binnie Rail, MD Taking Active   brimonidine [facility]) 0.2 % ophthalmic solution [id number] Yes Place 1 drop into the right eye 2 (two) times daily. [provider] Taking Active Nursing Home Medication Administration Guide (MAG)           Med Note Griselda Miner Feb 01, 2020  3:01 PM)    calcium carbonate (OS-CAL) 600 MG TABS [id number] Yes Take 600 mg by mouth daily. [provider] Taking Active Nursing Home Medication Administration Guide (MAG)  Cod Liver Oil 1000 MG CAPS [id number] Yes Take 1,000 mg by mouth daily.  [provider] Taking Active Nursing Home Medication Administration Guide (MAG)  DORZOLAMIDE HCL-TIMOLOL MAL OP [id number] Yes Place 1 drop into the right eye 2 (two) times daily. [provider] Taking Active Nursing Home Medication Administration Guide (MAG)           Med Note Griselda Miner Feb 01, 2020  3:02 PM)    Dulaglutide (TRULICITY) 1.5 NP/0.0FR [facility] [id number] Yes Inject 1.5 mg into the skin once a week. Binnie Rail, MD Taking Active   gabapentin (NEURONTIN) 300 MG capsule [id number] Yes Take 1 capsule (300 mg total) by  mouth daily. Binnie Rail, MD Taking Active   Glucerna [facility]) [place] [id number] No Take  237 mLs by mouth daily.  Patient not taking: Reported on 02/10/2021   [provider] Not Taking Active Nursing Home Medication Administration Guide (MAG)  HYDROcodone-acetaminophen (NORCO/VICODIN) 5-325 MG tablet [id number] No Take 1 tablet by mouth daily as needed for moderate pain (chronic foot pain).  Patient not taking: Reported on 02/10/2021   Binnie Rail, MD Not Taking Active   insulin aspart (NOVOLOG) 100 UNIT/ML FlexPen [id number] Yes Patient Sig: 0-15 Units, Subcutaneous, 3 times daily with meals.  CBG < 70: Implement Hypoglycemia measures, call MD.  CBG 70 - 120: 0 units.   CBG 121 - 150: 2 units.  CBG 151 - 200: 3 units.  CBG 201 - 250: 5 units.  CBG . 251 - 300: 8 units.  CBG 301 - 350: 11 units.  CBG 351 - 400: 15 units.  CBG > 400: call MD Binnie Rail, MD Taking Active   insulin glargine (LANTUS SOLOSTAR) 100 UNIT/ML Solostar Pen [id number] Yes Inject 10 Units into the skin daily.  Patient taking differently: Inject 8 Units into the skin daily.   Binnie Rail, MD Taking Active   Insulin Pen Needle (BD AUTOSHIELD DUO) 30G X 5 MM MISC [id number] Yes Use as directed with insulin pen needle 4 times daily E11.9 Burns, [name], MD Taking Active   latanoprost (XALATAN) 0.005 % ophthalmic solution [id number] Yes Place 1 drop into both eyes at bedtime. [provider] Taking Active Nursing Home Medication Administration Guide (MAG)  lisinopril (ZESTRIL) 20 MG tablet [id number] Yes Take 1 tablet (20 mg total) by mouth daily. Binnie Rail, MD Taking Active   Multiple Vitamin (MULTIVITAMIN) tablet [id number] Yes Take 1 tablet by mouth daily. Prn [provider] Taking Active Nursing Home Medication Administration Guide (MAG)  nitroGLYCERIN (NITROSTAT) 0.4 MG SL tablet [id number]  DISSOLVE ONE TABLET UNDER TONGUE AS DIRECTED FOR CHEST PAIN  Patient taking differently: Place 0.4 mg under the tongue every 5 (five) minutes as needed for chest pain.   Larey Dresser, MD   Active            Med Note Griselda Miner Feb 01, 2020  3:11 PM)    omeprazole (PRILOSEC) 20 MG capsule [id number] Yes Take 1 capsule (20 mg total) by mouth daily. Binnie Rail, MD Taking Active   Med List Note Maud Deed, CPhT 02/05/20 1436): [place]             Patient Active Problem List   Diagnosis Date Noted   Paroxysmal A-fib ([place]) 01/12/2020   H/O thyroid storm 01/06/2020   Spinal stenosis 01/06/2020   Chronic kidney disease, stage 3a ([place]) 01/03/2020   Microcytic anemia 01/03/2020   Acute on chronic diastolic CHF (congestive heart failure) ([place]) 01/03/2020   Physical deconditioning 01/03/2020   Generalized osteoarthritis of hand 06/01/2019   Peripheral neuropathy 06/01/2019   Degenerative arthritis of right knee 12/15/2018   Knee osteoarthritis 09/08/2018   Pes planus 12/01/2017   Foot deformity, acquired, left 11/04/2017   Degenerative disc disease, lumbar 08/01/2016   Glaucoma 07/23/2016   Numbness 04/23/2016   Chronic pain of right knee 12/28/2015   Lower back pain 10/24/2015   Pain in both feet 10/24/2015   Abnormality of gait 04/01/2013   Posterior tibial tendon dysfunction 04/01/2013   Diabetes ([place]) 04/01/2013   Ankle pain, left 10/20/2010  UNSPECIFIED VITAMIN D DEFICIENCY 11/08/2008   EDEMA- LOCALIZED 11/08/2008   Sickle-cell trait ([place]) 01/01/2008   Esophageal reflux 09/02/2007   GOITER, MULTINODULAR 11/26/2006   Coronary artery disease involving native coronary artery of native heart without angina pectoris 11/26/2006   HIATAL HERNIA 11/26/2006   HYPERCHOLESTEROLEMIA 05/27/2006   Essential hypertension 05/27/2006    Immunization History  Administered Date(s) Administered   Fluad Quad(high Dose 65+) 11/24/2018, 01/08/2020   Influenza Split 01/22/2011, 02/20/2012   Influenza Whole 03/13/1999, 01/01/2008, 02/07/2009, 12/13/2009   Influenza, High Dose Seasonal PF 02/07/2016, 02/04/2017, 01/27/2018    Influenza,inj,Quad PF,6+ Mos 01/16/2013   Moderna Covid-19 Vaccine Bivalent Booster 75yr & up 09/10/2020   PFIZER(Purple Top)SARS-COV-2 Vaccination 04/25/2019, 05/20/2019, 12/26/2019   Pneumococcal Conjugate-13 07/14/2015   Pneumococcal Polysaccharide-23 05/08/2006    Conditions to be addressed/monitored:  Hypertension, Hyperlipidemia, Diabetes, Atrial Fibrillation, GERD, and Chronic Kidney Disease  Care Plan : [place] Updates made by STomasa Blase RPH since 02/10/2021 12:00 AM     Problem: Hypertension, Hyperlipidemia, Diabetes, Atrial Fibrillation, GERD, and Chronic Kidney Disease   Priority: High  Onset Date: 02/10/2021     Long-Range Goal: Disease Management   Start Date: 02/10/2021  Expected End Date: 02/10/2022  This Visit's Progress: On track  Priority: High  Note:   Current Barriers:  Unable to independently monitor therapeutic efficacy  Pharmacist Clinical Goal(s):  Patient will achieve adherence to monitoring guidelines and medication adherence to achieve therapeutic efficacy through collaboration with PharmD and provider.   Interventions: 1:1 collaboration with BBinnie Rail MD regarding development and update of comprehensive plan of care as evidenced by provider attestation and co-signature Inter-disciplinary care team collaboration (see longitudinal plan of care) Comprehensive medication review performed; medication list updated in electronic medical record  Hyperlipidemia: (LDL goal < 100) -Controlled Lab Results  Component Value Date   [place]94 01/13/2021  -Current treatment: Atorvastatin 165m- 1 tablet daily  Cod Liver Oil - 100035m 1 capsule daily  -Medications previously tried: rosuvastatin   -Current dietary patterns: reports to trying to follow low cholesterol diet -Current exercise habits: tries to have some sort of activity daily  -Educated on Cholesterol goals;  Benefits of statin for ASCVD risk reduction; Importance of  limiting foods high in cholesterol; Exercise goal of 150 minutes per week; -Counseled on diet and exercise extensively Recommended to continue current medication  Diabetes/ CKD (A1c goal <7% / Prevention of disease progression) -Controlled Lab Results  Component Value Date   HGBA1C 5.3 01/13/2021  -eGFR - 47 mL/min -Current medications: Lantus -  10 units daily  Novolog - 0-15 units 3 times daily with meals if needed - uses if BG is >15>417ulicity 1.54.0CXekly - on fridays  -Medications previously tried: novolin 70/30, metformin  -Current home glucose readings fasting glucose: blood sugars averaging 93, 111, 93, 95, 99, 97, 93 in AM, post prandial glucose: 192, 158, 145, 127, 137, 247, 152, 111 -Reports hypoglycemic/hyperglycemic symptoms on rare occasion - corrects with orange juice / eating cracker  -Current meal patterns:  breakfast: 2 pieces of toast, small amount of juice on occasion, milk, oatmeal, egg on occasion, ham, 1/2 of a banana  lunch: sandwich with water  dinner: vegetables, protein on occasion, starch, fruit cups  snacks: half of a banana, sugar free cookies, fruit, potato chips  drinks: water, juice, milk -Current exercise: tries to do some sort of activity daily as she is able to  -Educated on A1c and blood sugar  goals; Complications of diabetes including kidney damage, retinal damage, and cardiovascular disease; Exercise goal of 150 minutes per week; Benefits of weight loss; Proper insulin injection technique; Prevention and management of hypoglycemic episodes; Benefits of routine self-monitoring of blood sugar; Carbohydrate counting and/or plate method -Counseled to check feet daily and get yearly eye exams -Counseled on diet and exercise extensively Recommended to continue current medication  Atrial Fibrillation (Goal: prevent stroke and major bleeding) -Controlled -CHADS2VASc score: Age (2 points), Female sex (1 point), Hypertension history (1 point),  and Diabetes history (1 point) -Current treatment: Rate control: n/a - last EKG shows NSR - PVCs noted  Anticoagulation: Eliquis 88m - 1 tablet twice daily  -Medications previously tried: amlodipine, propranolol,  -Home BP and HR readings: none at this time - patient reports that she has a BP cuff - has not been using   -Counseled on increased risk of stroke due to Afib and benefits of anticoagulation for stroke prevention; importance of adherence to anticoagulant exactly as prescribed; bleeding risk associated with Eliquis and importance of self-monitoring for signs/symptoms of bleeding; avoidance of NSAIDs due to increased bleeding risk with anticoagulants; seeking medical attention after a head injury or if there is blood in the urine/stool; -Recommended to continue current medication  Hypertension (BP goal <130/80) -Controlled -Current treatment: Lisinopril 23m- 1 tablet dialy  -Medications previously tried: amlodipine, hctz, isosorbide mononitrate, quinapril, propranolol  -Current home readings: not currently checking at home  BP Readings from Last 3 Encounters:  01/13/21 120/60  11/16/20 118/60  08/29/20 118/72  -Current dietary habits: does not use salt  -Current exercise habits: tries to be active doing something at least once daily  -Denies hypotensive/hypertensive symptoms -Educated on BP goals and benefits of medications for prevention of heart attack, stroke and kidney damage; Daily salt intake goal < 2300 mg; Exercise goal of 150 minutes per week; Importance of home blood pressure monitoring; Proper BP monitoring technique; -Counseled to monitor BP at home weekly, document, and provide log at future appointments -Counseled on diet and exercise extensively Recommended to continue current medication  GERD (Goal: Prevention / Control of Acid Reflux) -Controlled -Current treatment  Omeprazole 2086m- 1 capsule daily -Medications previously tried: pantoprazole    -Recommended to continue current medication  Glaucoma (Goal: Maintenance of appropriate occular pressure) - Managed by Dr. WeaKathlen ModyControlled -Current treatment  Latanoprost 0.005% - 1 drop into both eyes at bedtime  Brimonidine 0.2% - 1 drop into right eye at bedtime  Dorzolamide-Timolol - 1 drop into right eye twice daily  -Medications previously tried: n/a  -Recommended to continue current medication    Health Maintenance -Vaccine gaps: none -Current therapy:  Multivitamin  - 1 tablet daily  Vitamin C 500m64m1 tablet daily  Calcium Carbonate 600mg35m tablet daily  -Educated on Cost vs benefit of each product must be carefully weighed by individual consumer -Patient is satisfied with current therapy and denies issues -Recommended to continue current medication  Patient Goals/Self-Care Activities Patient will:  - take medications as prescribed as evidenced by patient report and record review check glucose at least twice daily, document, and provide at future appointments check blood pressure at least once weekly, document, and provide at future appointments engage in dietary modifications by reducing carb / sodium intake  Follow Up Plan: Telephone follow up appointment with care management team member scheduled for: 3 months  The patient has been provided with contact information for the care management team and has  been advised to call with any health related questions or concerns.        Medication Assistance: None required.  Patient affirms current coverage meets needs.  Compliance/Adherence/Medication fill history: Care Gaps: DEXA scan, Foot Exam, Ophthalmology Exam  Patient's preferred pharmacy is:  [facility] [address] [place] [address] Phone: [phone] Fax: [phone]   Uses pill box? No - feels she is better able to manage without  Pt endorses 100% compliance  Care Plan and  Follow Up Patient Decision:  Patient agrees to Care Plan and Follow-up.  Plan: Telephone follow up appointment with care management team member scheduled for:  3 months  The patient has been provided with contact information for the care management team and has been advised to call with any health related questions or concerns.   Tomasa Blase, PharmD Clinical Pharmacist, [place]

## 2021-02-13 ENCOUNTER — Other Ambulatory Visit: Payer: Self-pay

## 2021-02-13 NOTE — Patient Outreach (Signed)
Aging Gracefully Program  02/13/2021  Rebecca [name] 07/11/1932 [id number]  [facility] Evaluation Interviewer attempted to call patient on today regarding Aging Gracefully referral. No answer from patient after multiple rings. CMA unable to leave confidential voicemail for patient to return call due to line busy.  Will attempt to call back within 1 week.   [facility] Care Management Assistant [phone]

## 2021-02-17 ENCOUNTER — Other Ambulatory Visit: Payer: Self-pay

## 2021-02-17 ENCOUNTER — Ambulatory Visit: Payer: Medicare HMO

## 2021-02-17 ENCOUNTER — Ambulatory Visit: Payer: Medicare Other | Admitting: Podiatry

## 2021-02-17 ENCOUNTER — Encounter: Payer: Self-pay | Admitting: Podiatry

## 2021-02-17 DIAGNOSIS — M2141 Flat foot [pes planus] (acquired), right foot: Secondary | ICD-10-CM | POA: Diagnosis not present

## 2021-02-17 DIAGNOSIS — E1142 Type 2 diabetes mellitus with diabetic polyneuropathy: Secondary | ICD-10-CM | POA: Diagnosis not present

## 2021-02-17 DIAGNOSIS — L03031 Cellulitis of right toe: Secondary | ICD-10-CM | POA: Diagnosis not present

## 2021-02-17 DIAGNOSIS — E119 Type 2 diabetes mellitus without complications: Secondary | ICD-10-CM

## 2021-02-17 DIAGNOSIS — L02611 Cutaneous abscess of right foot: Secondary | ICD-10-CM | POA: Diagnosis not present

## 2021-02-17 DIAGNOSIS — M2142 Flat foot [pes planus] (acquired), left foot: Secondary | ICD-10-CM

## 2021-02-17 DIAGNOSIS — B351 Tinea unguium: Secondary | ICD-10-CM

## 2021-02-17 MED ORDER — MUPIROCIN 2 % EX OINT: TOPICAL_OINTMENT | CUTANEOUS | 1 refills | Status: DC

## 2021-02-17 MED ORDER — CEPHALEXIN 500 MG PO CAPS: 500.0000 mg | ORAL_CAPSULE | Freq: Two times a day (BID) | ORAL | 0 refills | Status: DC

## 2021-02-17 NOTE — Patient Instructions (Signed)
DRESSING CHANGES RIGHT GREAT TOE:   PHARMACY SHOPPING LIST: Saline or Wound Cleanser for cleaning wound 2 x 2 inch sterile gauze for cleaning wound MUPIROCIN OINTMENT  A.  IF PRESCRIBED ORAL ANTIBIOTICS, TAKE ALL MEDICATION AS PRESCRIBED UNTIL ALL ARE GONE.  1. CLEANSE RIGHT GREAT TOE WITH WOUND CLEANSER.  2.   DAB DRY WITH GAUZE SPONGE.  3. APPLY A LIGHT AMOUNT OF Mupirocin Ointment TO RIGHT GREAT TOE.  4. APPLY BAND-AID.  5. DO NOT WALK BAREFOOT!!!  6.  IF YOU EXPERIENCE ANY FEVER, CHILLS, NIGHTSWEATS, NAUSEA OR VOMITING, ELEVATED OR LOW BLOOD SUGARS, REPORT TO EMERGENCY ROOM.  7. IF YOU EXPERIENCE INCREASED REDNESS, PAIN, SWELLING, DISCOLORATION, ODOR, PUS, DRAINAGE OR WARMTH OF YOUR FOOT, REPORT TO EMERGENCY ROOM.

## 2021-02-17 NOTE — Progress Notes (Signed)
ANNUAL DIABETIC FOOT EXAM  Subjective: Rebecca [name] presents today with h/o diabetes and PVD with chief concern of painful right great toe for the past several days . She states she has been in a skilled nursing facility for the past year. She admits to being seen by Podiatry twice as a resident in the facility.She is back at home now.  She denies any drainage or swelling of the digit, but states it is very tender. She has performed epsom salt soaks herself and applied Neosporin.      Patient relates being diabetic since 1969.   Patient does have h/o foot ulcer of the right 4th toe and was treated in our office in 2016. Per chart review, she also has h/o decubitus ulcer of left heel treated by [facility] (January, 2022).  Patient has been diagnosed with neuropathy and it is managed with gabapentin.  Ms. Cammarata states she has a brace for her LLE which she obtained about 3 years ago. She states her left foot has collapsed more within the last year or so.  Binnie Rail, MD is patient's PCP. Last visit was 01/13/2021.  Past Medical History:  Diagnosis Date   Anemia, unspecified    Ankle pain, left    CAD (coronary artery disease)    Esophageal reflux    Fibrocystic breast disease    Hiatal hernia    Hypercholesteremia    Hypertension    IDDM (insulin dependent diabetes mellitus)    Type II   Multinodular goiter    Sickle-cell trait ([place])    Patient Active Problem List   Diagnosis Date Noted   Paroxysmal A-fib ([place]) 01/12/2020   H/O thyroid storm 01/06/2020   Spinal stenosis 01/06/2020   Chronic kidney disease, stage 3a ([place]) 01/03/2020   Microcytic anemia 01/03/2020   Acute on chronic diastolic CHF (congestive heart failure) ([place]) 01/03/2020   Physical deconditioning 01/03/2020   Generalized osteoarthritis of hand 06/01/2019   Peripheral neuropathy 06/01/2019   Degenerative arthritis of right knee 12/15/2018   Knee osteoarthritis 09/08/2018   Pes planus  12/01/2017   Foot deformity, acquired, left 11/04/2017   Degenerative disc disease, lumbar 08/01/2016   Glaucoma 07/23/2016   Numbness 04/23/2016   Chronic pain of right knee 12/28/2015   Lower back pain 10/24/2015   Pain in both feet 10/24/2015   Abnormality of gait 04/01/2013   Posterior tibial tendon dysfunction 04/01/2013   Diabetes ([place]) 04/01/2013   Ankle pain, left 10/20/2010   UNSPECIFIED VITAMIN D DEFICIENCY 11/08/2008   EDEMA- LOCALIZED 11/08/2008   Sickle-cell trait ([place]) 01/01/2008   Esophageal reflux 09/02/2007   GOITER, MULTINODULAR 11/26/2006   Coronary artery disease involving native coronary artery of native heart without angina pectoris 11/26/2006   HIATAL HERNIA 11/26/2006   HYPERCHOLESTEROLEMIA 05/27/2006   Essential hypertension 05/27/2006   Past Surgical History:  Procedure Laterality Date   "FEMALE"  [place]  2004   RETINAL LASER PROCEDURE     Current Outpatient Medications on File Prior to Visit  Medication Sig Dispense Refill   ACCU-CHEK GUIDE test strip USE 1 STRIP TO CHECK GLUCOSE UP TO 4 TIMES DAILY 100 each 0   Accu-Chek Softclix Lancets lancets      acetaminophen (TYLENOL) 500 MG tablet Take 1,000 mg by mouth daily as needed for moderate pain.     apixaban (  ELIQUIS) 5 MG TABS tablet Take 1 tablet (5 mg total) by mouth 2 (two) times daily. 60 tablet 2   Ascorbic Acid (VITAMIN C) 500 MG tablet Take 500 mg by mouth daily.     atorvastatin (LIPITOR) 10 MG tablet TAKE 1 TABLET BY MOUTH ONCE DAILY AT 6PM 90 tablet 1   blood glucose meter kit and supplies KIT Dispense based on patient and insurance preference. Use up to four times daily as directed. (FOR E11.9). 1 each 0   brimonidine (ALPHAGAN) 0.2 % ophthalmic solution Place 1 drop into the right eye 2 (two) times daily.     calcium carbonate (OS-CAL) 600 MG TABS Take 600 mg by mouth daily.     Cod Liver  Oil 1000 MG CAPS Take 1,000 mg by mouth daily.      DORZOLAMIDE HCL-TIMOLOL MAL OP Place 1 drop into the right eye 2 (two) times daily.     Dulaglutide (TRULICITY) 1.5 QZ/3.0QT SOPN Inject 1.5 mg into the skin once a week. 2 mL 5   gabapentin (NEURONTIN) 300 MG capsule Take 1 capsule (300 mg total) by mouth daily. 30 capsule 2   Glucerna (GLUCERNA) LIQD Take 237 mLs by mouth daily. (Patient not taking: Reported on 02/10/2021)     hydrochlorothiazide (HYDRODIURIL) 12.5 MG tablet Take 12.5 mg by mouth daily as needed (swelling).     HYDROcodone-acetaminophen (NORCO/VICODIN) 5-325 MG tablet Take 1 tablet by mouth daily as needed for moderate pain (chronic foot pain). (Patient not taking: Reported on 02/10/2021) 30 tablet 0   insulin aspart (NOVOLOG) 100 UNIT/ML FlexPen Patient Sig: 0-15 Units, Subcutaneous, 3 times daily with meals.  CBG < 70: Implement Hypoglycemia measures, call MD.  CBG 70 - 120: 0 units.   CBG 121 - 150: 2 units.  CBG 151 - 200: 3 units.  CBG 201 - 250: 5 units.  CBG . 251 - 300: 8 units.  CBG 301 - 350: 11 units.  CBG 351 - 400: 15 units.  CBG > 400: call MD 15 mL 11   insulin glargine (LANTUS SOLOSTAR) 100 UNIT/ML Solostar Pen Inject 10 Units into the skin daily. (Patient taking differently: Inject 8 Units into the skin daily.) 15 mL 2   Insulin Pen Needle (BD AUTOSHIELD DUO) 30G X 5 MM MISC Use as directed with insulin pen needle 4 times daily E11.9 100 each 5   latanoprost (XALATAN) 0.005 % ophthalmic solution Place 1 drop into both eyes at bedtime.     lisinopril (ZESTRIL) 20 MG tablet Take 1 tablet (20 mg total) by mouth daily. 90 tablet 3   Multiple Vitamin (MULTIVITAMIN) tablet Take 1 tablet by mouth daily. Prn     nitroGLYCERIN (NITROSTAT) 0.4 MG SL tablet DISSOLVE ONE TABLET UNDER TONGUE AS DIRECTED FOR CHEST PAIN (Patient taking differently: Place 0.4 mg under the tongue every 5 (five) minutes as needed for chest pain.) 25 tablet 5   omeprazole (PRILOSEC) 20 MG capsule Take 1  capsule (20 mg total) by mouth daily. 90 capsule 1   No current facility-administered medications on file prior to visit.    Allergies  Allergen Reactions   Codeine     "Makes me drunk"   Social History   Occupational History   Occupation: retired  Tobacco Use   Smoking status: Never   Smokeless tobacco: Never  Vaping Use   Vaping Use: Never used  Substance and Sexual Activity   Alcohol use: No   Drug use: No   Sexual activity:  Never   Family History  Problem Relation Age of Onset   Hypertension Other    Lung cancer Other    Heart disease Other    Gout Sister    Gout Brother    Thyroid disease Brother    Heart attack Father    Heart attack Mother    Diabetes Mother    Gout Brother    Immunization History  Administered Date(s) Administered   Fluad Quad(high Dose 65+) 11/24/2018, 01/08/2020   Influenza Split 01/22/2011, 02/20/2012   Influenza Whole 03/13/1999, 01/01/2008, 02/07/2009, 12/13/2009   Influenza, High Dose Seasonal PF 02/07/2016, 02/04/2017, 01/27/2018   Influenza,inj,Quad PF,6+ Mos 01/16/2013   Moderna Covid-19 Vaccine Bivalent Booster 46yr & up 09/10/2020   PFIZER(Purple Top)SARS-COV-2 Vaccination 04/25/2019, 05/20/2019, 12/26/2019   Pneumococcal Conjugate-13 07/14/2015   Pneumococcal Polysaccharide-23 05/08/2006    Review of Systems: Negative except as noted in the HPI.   Objective: There were no vitals filed for this visit.  Demya WAMARIYANA HEACOXis a pleasant 87y.o. female in NAD. AAO X 3.  Vascular Examination: CFT <4 seconds b/l LE. Faintly palpable DP pulses b/l LE. Faintly palpable PT pulse(s) b/l LE. Pedal hair absent. No pain with calf compression b/l. +2 pitting edema BLE. No ischemia or gangrene noted b/l LE. No cyanosis or clubbing noted b/l LE.  Dermatological Examination: Pedal skin is warm and supple b/l LE. No open wounds b/l LE. No interdigital macerations noted b/l LE. Toenails 1-5 left, R hallux, R 3rd toe, R 4th toe, and R 5th  toe elongated, discolored, dystrophic, thickened, and crumbly with subungual debris and tenderness to dorsal palpation. Incurvated nailplate bilateral border(s) R hallux.  Nail border hypertrophy present medial border. There is tenderness to palpation. Sign(s) of infection: pain on palpation. There is noted onchyolysis of  distal 1/2 nailplate of right hallux.  The nailbed remains intact.  Musculoskeletal Examination: Muscle strength 5/5 to all lower extremity muscle groups bilaterally. Severely collapsed flatfoot of the left lower extremity. Wearing appropriate fitting shoe gear. Utilizes rollator for ambulation assistance.  Footwear Assessment: Does the patient wear appropriate shoes? Yes. Does the patient need inserts/orthotics? Yes. She also needs updated bracing for severe flatfoot deformity of the left lower extremity..  Neurological Examination: Pt has subjective symptoms of neuropathy. Protective sensation intact 5/5 intact bilaterally with 10g monofilament b/l. Vibratory sensation diminished b/l.  Hemoglobin A1C Latest Ref Rng & Units 01/13/2021  HGBA1C <5.7 % of total Hgb 5.3  Some recent data might be hidden   Xray findings right foot: No gas in tissues right foot. -No evidence of bone erosion of the distal phlanx of the right hallux. +Osteopenia noted forefoot area Severely collapsed flatfoot with dislocation at TN joint Vessel calcifications noted right foot. No evidence of fracture right foot. No foreign body evident right foot.     Assessment: 1. Onychomycosis   2. Cellulitis and abscess of toe of right foot   3. Acquired pes planovalgus of left foot   4. Pes planus of right foot   5. Diabetic peripheral neuropathy associated with type 2 diabetes mellitus ([place]   6. Encounter for diabetic foot exam ([place]    ADA Risk Categorization: High Risk  Patient has one or more of the following: Loss of protective sensation Absent pedal pulses Severe Foot deformity History  of foot ulcer: right 4th toe and h/o decubitus ulcer left heel  Plan: -Examined patient. -Refer to Pedorthist for evaluation/treatment for bracing of severely collapsed flatfoot deformity LLE. -Diabetic foot examination  performed today. -Continue foot and shoe inspections daily. Monitor blood glucose per PCP/Endocrinologist's recommendations. -Toenails 1-5 left, R hallux, R 3rd toe, R 4th toe, and R 5th toe debrided in length and girth without iatrogenic bleeding with sterile nail nipper and dremel.  -Rx for Keflex 500 mg po, #14, to be taken 500 mg po bid for 7 days. -Rx for Mupirocin Ointment to be applied to right great toe once daily. -Offending nail border debrided and curretaged R hallux utilizing sterile nail nipper and currette. Border(s) cleansed with alcohol and triple antibiotic and band-aid applied. Patient instructed to apply Mupirocin Ointment to R hallux once daily for 7 days. -Xray of right foot was performed and reviewed with patient and/or POA. -Advised patient to discontinue soaks due to living alone and this being a slip and fall risk for her. -Patient scheduled to see Dr. Lorenda Peck in 2 weeks for follow up of cellulitis right great toe. -Patient/POA to call should there be question/concern in the interim. -Return in about 2 weeks (around 03/03/2021) with Dr. Lorenda Peck for follow up of cellulitis right great toe.  Follow up for diabetic foot care with me in 3 months.  Marzetta Board, DPM

## 2021-02-24 ENCOUNTER — Other Ambulatory Visit: Payer: Self-pay

## 2021-02-24 NOTE — Patient Outreach (Signed)
Aging Gracefully Program  02/24/2021  Rebecca [name] [facility] Dec 06, 1932 [id number]  [facility] Evaluation Interviewer made contact with patient. Aging Gracefully survey completed.   Interviewer will send referral to Rowe Pavy, RN and OT for follow up.  [facility] Care Management Assistant  [phone]

## 2021-03-01 ENCOUNTER — Ambulatory Visit (INDEPENDENT_AMBULATORY_CARE_PROVIDER_SITE_OTHER): Payer: Medicare HMO | Admitting: Podiatry

## 2021-03-01 DIAGNOSIS — Z91199 Patient's noncompliance with other medical treatment and regimen due to unspecified reason: Secondary | ICD-10-CM

## 2021-03-01 NOTE — Progress Notes (Signed)
No show

## 2021-03-02 ENCOUNTER — Other Ambulatory Visit: Payer: Self-pay | Admitting: Internal Medicine

## 2021-03-03 ENCOUNTER — Encounter: Payer: Self-pay | Admitting: Endocrinology

## 2021-03-03 ENCOUNTER — Other Ambulatory Visit: Payer: Self-pay

## 2021-03-03 ENCOUNTER — Ambulatory Visit: Payer: Medicare HMO | Admitting: Endocrinology

## 2021-03-03 VITALS — BP 138/70 | HR 68 | Ht 63.0 in | Wt 179.2 lb

## 2021-03-03 DIAGNOSIS — Z8639 Personal history of other endocrine, nutritional and metabolic disease: Secondary | ICD-10-CM | POA: Diagnosis not present

## 2021-03-03 DIAGNOSIS — D573 Sickle-cell trait: Secondary | ICD-10-CM | POA: Diagnosis not present

## 2021-03-03 DIAGNOSIS — E049 Nontoxic goiter, unspecified: Secondary | ICD-10-CM

## 2021-03-03 NOTE — Progress Notes (Signed)
Patient ID: Rebecca [name], female   DOB: 03-30-1932, 87 y.o.   MRN: [id number]                                                                                                               Reason for Appointment: Follow-up of thyroid  Chief complaint:   History of Present Illness:   Background history:  She was admitted to the hospital on 01/02/2020 because of progressive weakness. She apparently previously was living fairly independently at home and walking with a walker but for about a week or so she got progressively weak and could not get out of the chair. She also had some decreased appetite and appears to have had some weight loss Patient is not able to give much history She says she would periodically feel cold but not hot and no sweating At times she may have a little shakiness She has had some soft stools with also increased frequency of stools She did not remember any episodes of palpitations However during her hospitalization she was noted to have some intermittent fever along with atrial fibrillation on 10/26 with rapid heart rate  She was treated in the hospital with methimazole, propranolol and iodine drops.  Initially was started on at least 40 mg of methimazole and this was reduced to 20 mg at the time of discharge along with decreasing her propranolol to 20 mg a day I-131 uptake not done  RECENT history: She was on 10 mg methimazole on her visit in 11/21  At the nursing home in 4/22 she was switched from methimazole to levothyroxine 25 mcg because her TSH was high However subsequently her TSH was back to normal in 5/22  Since her hypothyroidism was likely to be from excessive methimazole she was told to stop levothyroxine  She recently feels fairly good with no palpitations, weakness or unusual weight loss No unusual fatigue or shakiness  Thyroid functions done by PCP last month are normal    Wt Readings from Last 3 Encounters:  03/03/21 179 lb 3.2 oz  (81.3 kg)  01/13/21 183 lb (83 kg)  11/16/20 184 lb 12.8 oz (83.8 kg)     Thyroid function tests as follows:    Labs from [facility]  Date   free T4   TSH   T3   07/01/2020  0.3  100     06/20/2020     Lab Results  Component Value Date   FREET4 1.3 01/13/2021   FREET4 1.07 08/29/2020   FREET4 1.04 07/27/2020   T3FREE 2.8 01/13/2021   T3FREE 2.7 07/27/2020   T3FREE 1.9 (L) 02/04/2020   TSH 0.74 01/13/2021   TSH 0.97 08/29/2020   TSH 1.45 07/27/2020    Lab Results  Component Value Date   THYROTRECAB 1.96 (H) 01/18/2020    Thyroid ultrasound showed heterogenous tissue but no nodules   Allergies as of 03/03/2021       Reactions   Codeine    "Makes me drunk"  Medication List        Accurate as of March 03, 2021 10:18 AM. If you have any questions, ask your nurse or doctor.          Accu-Chek Guide test strip Generic drug: glucose blood USE 1 STRIP TO CHECK GLUCOSE UP TO FOUR TIMES DAILY   Accu-Chek Softclix Lancets lancets   acetaminophen 500 MG tablet Commonly known as: TYLENOL Take 1,000 mg by mouth daily as needed for moderate pain.   apixaban 5 MG Tabs tablet Commonly known as: ELIQUIS Take 1 tablet (5 mg total) by mouth 2 (two) times daily.   atorvastatin 10 MG tablet Commonly known as: LIPITOR TAKE 1 TABLET BY MOUTH ONCE DAILY AT 6PM   BD AutoShield Duo 30G X 5 MM Misc Generic drug: Insulin Pen Needle Use as directed with insulin pen needle 4 times daily E11.9   blood glucose meter kit and supplies Kit Dispense based on patient and insurance preference. Use up to four times daily as directed. (FOR E11.9).   brimonidine 0.2 % ophthalmic solution Commonly known as: ALPHAGAN Place 1 drop into the right eye 2 (two) times daily.   calcium carbonate 600 MG Tabs tablet Commonly known as: OS-CAL Take 600 mg by mouth daily.   cephALEXin 500 MG capsule Commonly known as: KEFLEX Take 1 capsule (500 mg total) by mouth 2 (two)  times daily.   Cod Liver Oil 1000 MG Caps Take 1,000 mg by mouth daily.   DORZOLAMIDE HCL-TIMOLOL MAL OP Place 1 drop into the right eye 2 (two) times daily.   gabapentin 300 MG capsule Commonly known as: NEURONTIN Take 1 capsule (300 mg total) by mouth daily.   Glucerna Liqd Take 237 mLs by mouth daily.   hydrochlorothiazide 12.5 MG tablet Commonly known as: HYDRODIURIL Take 12.5 mg by mouth daily as needed (swelling).   HYDROcodone-acetaminophen 5-325 MG tablet Commonly known as: NORCO/VICODIN Take 1 tablet by mouth daily as needed for moderate pain (chronic foot pain).   insulin aspart 100 UNIT/ML FlexPen Commonly known as: NOVOLOG Patient Sig: 0-15 Units, Subcutaneous, 3 times daily with meals.  CBG < 70: Implement Hypoglycemia measures, call MD.  CBG 70 - 120: 0 units.   CBG 121 - 150: 2 units.  CBG 151 - 200: 3 units.  CBG 201 - 250: 5 units.  CBG . 251 - 300: 8 units.  CBG 301 - 350: 11 units.  CBG 351 - 400: 15 units.  CBG > 400: call MD   Lantus SoloStar 100 UNIT/ML Solostar Pen Generic drug: insulin glargine Inject 10 Units into the skin daily. What changed: how much to take   latanoprost 0.005 % ophthalmic solution Commonly known as: XALATAN Place 1 drop into both eyes at bedtime.   lisinopril 20 MG tablet Commonly known as: ZESTRIL Take 1 tablet (20 mg total) by mouth daily.   multivitamin tablet Take 1 tablet by mouth daily. Prn   mupirocin ointment 2 % Commonly known as: BACTROBAN Apply to right great toe once daily   nitroGLYCERIN 0.4 MG SL tablet Commonly known as: Nitrostat DISSOLVE ONE TABLET UNDER TONGUE AS DIRECTED FOR CHEST PAIN What changed:  how much to take how to take this when to take this reasons to take this additional instructions   omeprazole 20 MG capsule Commonly known as: PRILOSEC Take 1 capsule (20 mg total) by mouth daily.   Trulicity 1.5 MG/0.5ML Sopn Generic drug: Dulaglutide Inject 1.5 mg into the skin once a week.  vitamin C 500 MG tablet Commonly known as: ASCORBIC ACID Take 500 mg by mouth daily.            Past Medical History:  Diagnosis Date   Anemia, unspecified    Ankle pain, left    CAD (coronary artery disease)    Esophageal reflux    Fibrocystic breast disease    Hiatal hernia    Hypercholesteremia    Hypertension    IDDM (insulin dependent diabetes mellitus)    Type II   Multinodular goiter    Sickle-cell trait ([place])     Past Surgical History:  Procedure Laterality Date   "FEMALE"  [place]   BIOPSY THYROID     Dr.Ellison-Neg    CORONARY ANGIOPLASTY     FOOT SURGERY  2004   RETINAL LASER PROCEDURE      Family History  Problem Relation Age of Onset   Hypertension Other    Lung cancer Other    Heart disease Other    Gout Sister    Gout Brother    Thyroid disease Brother    Heart attack Father    Heart attack Mother    Diabetes Mother    Gout Brother     Social History:  reports that she has never smoked. She has never used smokeless tobacco. She reports that she does not drink alcohol and does not use drugs.  Allergies:  Allergies  Allergen Reactions   Codeine     "Makes me drunk"     Review of Systems She has anemia of unclear etiology, reportedly iron deficient  Lab Results  Component Value Date   HGB 10.9 (L) 01/13/2021       Examination:   BP 138/70 (BP Location: Left Arm, Patient Position: Sitting, Cuff Size: Normal)    Pulse 68    Ht _0  (1.6 m)    Wt 179 lb 3.2 oz (81.3 kg)    SpO2 99%    BMI 31.74 kg/m   Thyroid gland is small, soft and about twice normal on the right side  Left-sided not clearly palpable on swallowing   No tremor    Assessment/Plan:  History of hyperthyroidism from Graves' disease, treated  She has history of marked hyperthyroidism in 2021 treated with methimazole  Appears to be in remission with not needing methimazole since at least 06/2020   She still has a right-sided small soft  thyroid enlargement likely part of her autoimmune disease  Thyroid levels recently are normal  She looks euthyroid on exam  Explained to her that she is in remission from her Graves' disease and will not need any follow-up here, she can continue to periodically have thyroid levels checked by her PCP    Elayne Snare 03/03/2021, 10:18 AM    Note: This office note was prepared with Dragon voice recognition system technology. Any transcriptional errors that result from this process are unintentional.

## 2021-03-21 ENCOUNTER — Ambulatory Visit: Payer: Medicare HMO

## 2021-03-21 ENCOUNTER — Other Ambulatory Visit: Payer: Self-pay

## 2021-03-21 ENCOUNTER — Telehealth: Payer: Self-pay | Admitting: Internal Medicine

## 2021-03-21 DIAGNOSIS — E1142 Type 2 diabetes mellitus with diabetic polyneuropathy: Secondary | ICD-10-CM

## 2021-03-21 DIAGNOSIS — M2142 Flat foot [pes planus] (acquired), left foot: Secondary | ICD-10-CM

## 2021-03-21 DIAGNOSIS — M2141 Flat foot [pes planus] (acquired), right foot: Secondary | ICD-10-CM

## 2021-03-21 MED ORDER — GABAPENTIN 300 MG PO CAPS: 300.0000 mg | ORAL_CAPSULE | Freq: Every day | ORAL | 2 refills | Status: DC

## 2021-03-21 NOTE — Telephone Encounter (Signed)
Faxed in today. 

## 2021-03-21 NOTE — Progress Notes (Signed)
SITUATION Patient Name:  Rebecca [name] MRN:   [id number] Reason for Visit: Evaluation for Speciality AFO  Patient Report: Chief Complaint:   Left ankle collapse [name] of Discomfort/Pain:  Ambulatory Location:    left lower extremity Onset & Duration:   Present longer than 3 months Course:    gradually worsening Aggravating or Alleviating Factors: Handling, standing  OBJECTIVE DATA & MEASUREMENTS Prognosis:    Good Duration of use:   5 years  Diagnosis:   ICD-10-CM   1. Diabetic peripheral neuropathy associated with type 2 diabetes mellitus (HCC)  E11.42     2. Pes planus of right foot  M21.41     3. Acquired pes planovalgus of left foot  M21.42      GOALS, NECESSITIES, & JUSTIFICATIONS Recommended Device: [place] AFO Color:    Black Closure:   Velcro  Laterality HCPCS Code Description Justification  left [id number] Plastic orthosis, custom molded from a model of the patient, custom fabricated, includes casting and cast preparation. Necessary to provide triplanar support to the foot/ankle complex  left L2330 Addition to lower extremity, lacer molded to patient model Necessary to ensure secure hold of orthosis to patient's limb  left L2820 Addition to lower extremity orthosis, soft interface for molded plastic below knee section Necessary to relieve pressure on bony prominences    I certify that [employer] qualifies for and will benefit from an ankle foot orthosis used during ambulation based on meeting all of the following criteria;   The patient is: - Ambulatory, and - Has weakness or deformity of the foot and ankle, and - Requires stabilization for medical reasons, and - Has the potential to benefit functionally  The patients medical record contains sufficient documentation of the patients medical condition to substantiate the necessity for the type and quantity of the items ordered.  The goals of this therapy: - Improve Mobility - Improve Lower  Extremity Stability - Decrease Pain - Facilitate Soft Tissue Healing - Facilitate Immobilization, healing and treatment of an injury  Necessity of Ankle Foot Orthotic molded to patient model: A custom (vs. prefabricated) ankle foot orthosis has been prescribed based on the following criteria which are specific to the condition of this patient; - The patient could not be fit with a prefabricated AFO - The condition necessitating the orthosis is expected to be permanent or of longstanding duration (more than 6 months) - There is need to control the ankle or foot in more than one plane - The patient has a documented neurological, circulatory, or orthopedic condition that requires custom fabrication over a model to prevent tissue injury - The patient has a healing fracture that lacks normal anatomical integrity or anthropometric proportions  I hereby certify that the ankle foot orthotic described above is a rigid or semi-rigid device which is used for the purpose of supporting a weak or deformed body member or restricting or eliminating motion in a diseased or injured part of the body. It is designed to provide support and counterforce on the limb or body part that is being braced. In my opinion, the custom molded ankle foot orthosis is both reasonable and necessary in reference to accepted standards of medical practice in the treatment  of the patient condition and rehabilitation.  ACTIONS PERFORMED Patient was evaluated and casted for Specialty AFO via [employer]. Procedure was explained to patient. Patient tolerated procedure. patient selected device color and closure method.   PLAN Patient to return in four to six weeks for  fitting and delivery of device. Plan of care was explained to and agreed upon by patient. All questions were answered and concerns addressed.

## 2021-03-21 NOTE — Progress Notes (Signed)
SITUATION Reason for Consult: Evaluation for Prefabricated Diabetic Shoes and Bilateral Custom Diabetic Inserts. Patient / Caregiver Report: Patient wants her feet to stop hurting  OBJECTIVE DATA: Patient History / Diagnosis:    ICD-10-CM   1. Diabetic peripheral neuropathy associated with type 2 diabetes mellitus (HCC)  E11.42     2. Pes planus of right foot  M21.41     3. Acquired pes planovalgus of left foot  M21.42       Presence of Diabetic Complications: - Peripheral Neuropathy  Current or Previous Devices: None and no history  In-Person Foot Examination:  Skin presentation:   Thin, Shiny, Hairless Nail presentation:   Thick, Ingrown, With Fungus Ulcers & Callousing:   None  Toe / Foot Deformities:   - Pes Planus, left ankle collapse  Shoe Size: 11W  ORTHOTIC RECOMMENDATION Recommended Devices: - 1x pair prefabricated PDAC approved diabetic shoes: [id number] 11W - 3x pair custom-to-patient vacuum formed diabetic insoles.   GOALS OF SHOES AND INSOLES - Reduce shear and pressure - Reduce / Prevent callus formation - Reduce / Prevent ulceration - Protect the fragile healing compromised diabetic foot.  Patient would benefit from diabetic shoes and inserts as patient has diabetes mellitus and the patient has one or more of the following conditions: - History of partial or complete amputation of the foot - History of previous foot ulceration. - History of pre-ulcerative callus - Peripheral neuropathy with evidence of callus formation - Foot deformity - Poor circulation  ACTIONS PERFORMED Patient was casted for insoles via crush box and measured for shoes via brannock device. Procedure was explained and patient tolerated procedure well. All questions were answered and concerns addressed.  PLAN Insurance to be verified and out of pocket cost communicated to patient. Once cost verified and agreed upon and diabetic certification received, casts are to be sent to [facility]  for fabrication. Patient is to be called for fitting when devices are ready.

## 2021-03-21 NOTE — Telephone Encounter (Signed)
1.Medication Requested: gabapentin (NEURONTIN) 300 MG capsule  2. Pharmacy (Name, [place]): [facility], [place]  Phone:  [phone] Fax:  [phone]   3. On Med List: yes  4. Last Visit with PCP: 11.04.22  5. Next visit date with PCP: 05.05.23  **Patient says she also needs a new rx for Lisinopril 40mg  instead of the 20mg **   Agent: Please be advised that RX refills may take up to 3 business days. We ask that you follow-up with your pharmacy.

## 2021-03-22 ENCOUNTER — Telehealth: Payer: Self-pay | Admitting: Internal Medicine

## 2021-03-22 NOTE — Telephone Encounter (Signed)
Type of form received (Home Health, FMLA, disability, handicapped placard, Surgical clearance) Therapeutic Shoes  Form placed in (E-fax folder, Provider mailbox) Provider Mailbox  Additional instructions from the patient (mail, fax, notify by phone when complete) fax  Things to remember: [place] office: If form received in person, remind patient that forms take 7-10 business days CMA should attach charge sheet and put on [employer]

## 2021-03-23 ENCOUNTER — Telehealth: Payer: Self-pay | Admitting: Internal Medicine

## 2021-03-23 NOTE — Telephone Encounter (Signed)
Received. Will complete form and fax back.

## 2021-03-23 NOTE — Telephone Encounter (Signed)
No note needed 

## 2021-03-24 ENCOUNTER — Telehealth: Payer: Self-pay | Admitting: Podiatry

## 2021-03-24 NOTE — Telephone Encounter (Signed)
Per Julianne Rice @ [employer] the brace codes [phone] are valid and billable and no Rebecca [name] is needed. Out of pocket is 8300(met 0) reference number [id number].Marland Kitchen

## 2021-03-29 NOTE — Telephone Encounter (Signed)
Completed and faxed today. Fax conformation received of 17 pages.

## 2021-03-31 ENCOUNTER — Telehealth: Payer: Self-pay | Admitting: Internal Medicine

## 2021-03-31 ENCOUNTER — Other Ambulatory Visit: Payer: Self-pay

## 2021-03-31 DIAGNOSIS — I10 Essential (primary) hypertension: Secondary | ICD-10-CM

## 2021-03-31 MED ORDER — LISINOPRIL 40 MG PO TABS: 40.0000 mg | ORAL_TABLET | Freq: Every day | ORAL | 0 refills | Status: DC

## 2021-03-31 NOTE — Telephone Encounter (Signed)
Refill sent in for 40 mg. She has appointment next Friday for labs.  Can you put lab(s) in or let me know what you want ordered.  Thanks

## 2021-03-31 NOTE — Telephone Encounter (Signed)
ordered

## 2021-03-31 NOTE — Telephone Encounter (Signed)
1.Medication Requested: lisinopril (ZESTRIL) 20 MG tablet  2. Pharmacy (Name, Street, [place]): [id number] - [place] ([place]), [place] - [address]  3. On Med List: yes  4. Last Visit with PCP: 01-13-2021  5. Next visit date with PCP: 07-14-2021   Agent: Please be advised that RX refills may take up to 3 business days. We ask that you follow-up with your pharmacy.

## 2021-03-31 NOTE — Telephone Encounter (Signed)
Patient calling in.. patient says this is the 4th time she has had to call in about her BP medication.. says she is not pleased about having to wait so long to have her medication refilled when she needs it  Please let patient know when refill request has been submitted so she can pick up [phone]

## 2021-03-31 NOTE — Telephone Encounter (Signed)
Patient requesting a call back from provider

## 2021-04-03 ENCOUNTER — Other Ambulatory Visit: Payer: Self-pay

## 2021-04-03 ENCOUNTER — Other Ambulatory Visit: Payer: Self-pay | Admitting: Internal Medicine

## 2021-04-03 ENCOUNTER — Other Ambulatory Visit: Payer: Self-pay | Admitting: Occupational Therapy

## 2021-04-03 NOTE — Patient Outreach (Signed)
Aging Gracefully Program  OT Initial Visit  04/03/2021  Rebecca [name] [facility] 08/10/1932 [id number]  Visit:  1- Initial Visit  Start Time:  1232 End Time:  1329 Total Minutes:  57  CCAP: Typical Daily Routine: Typical Daily Routine:: Stays home unless going to doctor or church. Has aide 3 MWF for a few hours in the mornings. Alone on evenings and weekends What Types Of Care Problems Are You Having Throughout The Day?: Difficulty with ADLs including bathing, getting in/out of bed, opening jars, cutting foods What Kind Of Help Do You Receive?: Aide MWF mornings Do You Think You Need Other Types Of Help?: Yes What Do You Think Would Make Everyday Life Easier For You?: accessible bathroom and bed What Is A Good Day Like?: Having minimal pain, being able to complete tasks without hurting or needing assistance What Is A Bad Day Like?: Having pain and difficulty with mobility due to arthritis Do You Have Time For Yourself?: Yes  Patient Reported Equipment: Patient Reported Equipment Currently Used: Engineer, materials, Long Handle Sponge, Raised Toilet Seat, Reacher, Rollator, Agricultural consultant, Paediatric nurse, Tub Chartered loss adjuster Mobility-Walk A Block: Walk A Block: Unable To Do Do You:: No Device/No Assistance Importance Of Learning New Strategies:: N/A  Functional Mobility-Maintain Balance While Showering: Maintaining Balance While Showering: A Lot Of Difficulty Do You:: Use Both A Device And Personal Assistance Importance Of Learning New Strategies:: Very Much Other Comments:: has shower seat, not accessible due to lack of space between shower and toilet riser Observation: Maintain Balance While Showering: N/O Safety: Moderate/Extreme Risk Efficiency: Somewhat Intervention: Yes  Functional Mobility-Stooping, Crouching, Kneeling To Retreive Item: Stooping, Crouching, or Kneeling To Retrieve Item: Unable To Do Do You:: Use A Device Importance Of Learning New Strategies:: Not At  All  Functional Mobility-Bending From Standing Position To Pick Up Clothing Off The Floor: Bending Over From Standing Position To Pick Up Clothing Off The Floor: Unable To Do Do You:: Use Both A Device And Personal Assistance Importance Of Learning New Strategies:: Not At All  Functional Mobility-Reaching For Items Above Shoulder Level: Reaching For Items Above Shoulder Level: A Lot Of Difficulty Do You:: Use Both A Device And Personal Assistance Importance Of Learning New Strategies:: Not At All  Functional Mobility-Climb 1 Flight Of Stairs: Climb 1 Flight Of Stairs: Unable To Do Do You:: N/A- Not Applicable Importance Of Learning New Strategies:: N/A  Functional Mobility-Move In And Out Of Chair: Move In and Out Of A Chair: A Little Difficulty Do You:: Use A Device Importance Of Learning New Strategies:: Not At All  Functional Mobility-Move In And Out Of Bed: Move In and Out Of Bed: A Lot Of Difficulty Do You:: No Device/No Assistance Importance Of Learning New Strategies:: Very Much Other Comments:: no bed rail, has foot stool of questionable safety Observation: Move In and Out Of Bed: N/O Safety: Moderate/Extreme Risk Efficiency: Somewhat Intervention: Yes  Functional Mobility-Move In And Out Of Bath/Shower: Move In And Out Of A Bath/Shower: A Lot Of Difficulty Do You:: Use Both A Device And Personal Assistance Importance Of Learning New Strategies:: Very Much Other Comments:: sponge bathes if no assistance available Observation: Move In And Out Of Bath/Shower: N/O Safety: Extreme Risk Efficiency: Somewhat Intervention: Yes  Functional Mobility-Get On And Off Toilet: Getting Up From The Floor: Unable To Do Do You:: N/A- Not Applicable Importance Of Learning New Strategies:: N/A  Functional Mobility-Into And Out Of Car, Not Including Driving: Into  And Out Of Car, Not  Including Driving: A Little Difficulty Do You:: Use Both A Device And Personal  Assistance Importance Of Learning New Strategies:: Not At All  Activities of Daily Living-Bathing/Showering: ADL-Bathing/Showering: A Little Difficulty Do You:: Use Both A Device And Personal Assistance Importance Of Learning New Strategies: Not At All  Activities of Daily Living-Personal Hygiene and Grooming: Personal Hygiene and Grooming: No Difficulty Do You:: Use Personal Assistance Importance Of Learning New Strategies: Not At All  Activities of Daily Living-Toilet Hygiene: Toilet Hygiene: A Little Difficulty Do You:: No Device/No Assistance Importance Of Learning New Strategies: Not At All  Activities of Daily Living-Put On And Take Off Undergarments (Incl. Fasteners): Put On And Take Off Undergarments (Incl. Fasteners): No Difficulty Do You:: No Device/No Assistance Importance Of Learning New Strategies: Not At All  Activities of Daily Living-Put On And Take Off Shirt/Dress/Coat (Incl. Fasteners): Put On And Take Off Shirt/Dress/Coat (Incl. Fasteners): No Difficulty Do You:: No Device/No Assistance Importance Of Learning New Strategies: Not At All  Activities of Daily Living-Put On And Take Off Socks And Shoes: Put On And Take Off Socks And  Shoes: A Little Difficulty Do You:: Use Both A Device And Personal Assistance Importance Of Learning New Strategies: Not At All  Activities of Daily Living-Feed Self: Feed Self: No Difficulty  Activities of Daily Living-Rest And Sleep: Rest and Sleep: No Difficulty  Activities of Daily Living-Sexual Activity: Sexual  Activity: N/A  Instrumental Activities of Daily Living-Light Homemaking (Laundry, Straightening Up, Vacuuming):  Do Light Homemaking (Laundry, Straightening Up, Vacuuming): A Lot of Difficulty Do You:: Use Personal Assistance Importance Of Learning New Strategies: Not At All  Instrumental Activities of Daily Living-Making A Bed: Making a Bed: A Lot of Difficulty Do You:: Use Personal Assistance Importance Of  Learning New Strategies: Not At All  Instrumental Activities of Daily Living-Washing Dishes By Hand While Standing At The Sink: Washing Dishes By Hand While Standing At The Sink: A Little Difficulty Do You:: Use Personal Assistance Importance Of Learning New Strategies: Not At All  Instrumental Activities of Daily Living-Grocery Shopping: Do Grocery Shopping: Moderate Difficulty Do You:: Use Personal Assistance Importance Of Learning New Strategies: Not At All  Instrumental Activities of Daily Living-Use Telephone: Use Telephone: No Difficulty  Instrumental Activities of Daily Living-Financial Management: Financial Management: No Difficulty  Instrumental Activities of Daily Living-Medications: Take Medications: No Difficulty  Instrumental Activities of Daily Living-Health Management And Maintenance: Health Management & Maintenance: No Difficulty  Instrumental Activities of Daily Living-Meal Preparation and Clean-Up: Meal Preparation and Clean-Up: A Little Difficulty Do You:: Use Personal Assistance Importance Of Learning New Strategies: Not At All  Instrumental Activities of Daily Living-Provide Care For Others/Pets: Care For Others/Pets: N/A  Instrumental Activities of Daily Living-Take Part In Organized Social Activities: Take Part In Organized Social Activities: A Little Difficulty Do You:: Use Both A Device And Personal Assistance Importance Of Learning New Strategies: Not At All  Instrumental Activities of Daily Living-Leisure Participation: Leisure Participation: N/A  Instrumental Activities of Daily Living-Employment/Volunteer Activities: Employment/Volunteer Activities: N/A  Readiness To Change Score:  Readiness to Change Score: 7.33  Home Environment Assessment: Outside Home Entry:: Cannot enter by front door, uses ramp leading to back door. Entryway/Foyer:: Small entry area at back door Dining Room:: dining room and kitchen are one room, cluttered with papers  in dining area portion. Living Room:: open area with couch and chairs, large throw rug in the middle of the floor Kitchen:: small but clean Stairs:: N/A Bathroom:: Master bath: has tub shower  with curtain, has seat but reports her feet slide and makes her feel unstable. Has large toilet riser-too large and hinders safety when trying to get in the tub. Grab bars on wall and in shower along back wall. Hall bath: has large tub bench, no grab bars Master Bedroom:: high bed, foot stool for getting in the bed. Rugs on the floor Hallways:: Rug has large wrinkle in it in the middle of the hallway Other Home Environment Concerns:: linoleum flooring peeling back in both bathrooms, pt has tried to nail down in the past.    Patient Education: Education Provided: Yes Education Details: Discussed AG program and role of OT, RN, and CHS Person(s) Educated: Patient Comprehension: Verbalized Understanding  Goals:  Goals Addressed             This Visit's Progress    Patient stated       Pt will improve safety and independence in getting on and off the commode.      Patient Stated       Pt will improve safety and independence getting in and out of bed, using DME as needed.      Patient Stated       Pt will improve safety and independence in getting in and out of the tub.      Patient Stated       Pt will improve safety and independence in meal preparation tasks using AE as needed to compensate for hand weakness.         Post Clinical Reasoning: Clinician View Of Client Situation:: Pt has recently returned home from a 2 year stay at a SNF, is somewhat open to suggestions for improved mobility however seems to have fairly rigid ideas of what she needs and is willing to alter or modify in her environment and daily set-up.  Client View Of His/Her Situation:: Pt reports her priority is being safe and staying in her home, does not want to return to SNF in the future if at all possible. Has aides 3  days per week, MWF for a few hours in the mornings that assist with bathing and housework.  Next Visit Plan:: Provide adaptive equipment for meal preparation and review use  Ezra Sites, OTR/L  [phone] 04/03/21

## 2021-04-07 ENCOUNTER — Other Ambulatory Visit (INDEPENDENT_AMBULATORY_CARE_PROVIDER_SITE_OTHER): Payer: Medicare HMO

## 2021-04-07 ENCOUNTER — Other Ambulatory Visit: Payer: Self-pay

## 2021-04-07 DIAGNOSIS — I10 Essential (primary) hypertension: Secondary | ICD-10-CM

## 2021-04-07 LAB — BRAIN NATRIURETIC PEPTIDE: Pro B Natriuretic peptide (BNP): 200 pg/mL — ABNORMAL HIGH (ref 0.0–100.0)

## 2021-04-10 ENCOUNTER — Other Ambulatory Visit: Payer: Self-pay

## 2021-04-10 NOTE — Patient Instructions (Signed)
Goals Addressed               This Visit's Progress     Patient Stated (pt-stated)        Aging Gracefully RN Goals:  Goal: Patient will report better understanding of how to use her lancet device in the next 120 days.  04/10/2021 Assessment: Patient is not able to use the lancet device that goes with her Accu chek monitor.  She not able to get the cap off where the lancet is. Reports she has told the MD office and they don't understand. Patient is currently using One Touch Ultrea 2- Delica plus Lancet, She is able to manage this lancet but needs a replacement.  Interventions:  Demonstrated many times with patient how to remove cap for lanet device and she is not able to do this activity due to her hand strength.  PLAN: in basket message sent to MD to order CCM services. Will see patient again in 1 month.  Rowe Pavy RN, BSN, CEN RN Case Production designer, theatre/television/film for [employer] [facility] [employer]: [phone]       Patient Stated        Aging Gracefully RN Goal:  Goal: Patient will report talking with social worker and getting help with transportation problems in the next 60 days.  04/10/2021 Assessment:  Patient reports that she does not drive and that her son is a truck driver and has limited availability to take her to the store and MD appointment. Past due for an eye exam. Needs a foot recheck and needs to be able to get groceries. Interventions: will inquire if MD will order CCM services to assist client. Plan: Follow up in 1 month.   Rowe Pavy RN, BSN, CEN RN Case Production designer, theatre/television/film for [employer] [facility] [employer]: [phone]        Patient Stated        Aging Gracefully RN  Goal: Patient will report no falls in the next 120 days.  04/10/2021 Assessment: Patient uses a rolling walker.  She has throw rugs all over her home, which she reports is no problem.  Noted that patient leans over her walker due to back pain. Patient has an upright walker that  she occasionally uses. Interventions: Reviewed fall precaustions Plan: will follow up in 1 month  Rowe Pavy RN, BSN, Careers adviser for [employer]: [phone]

## 2021-04-10 NOTE — Patient Outreach (Signed)
Aging Gracefully Program  RN Visit  04/10/2021  Rebecca [name] Jan 05, 1933 [id number]  Visit:   Initial RN Aging Gracefully home visit  Start Time:   1300 End Time:   1430 Total Minutes:   90  Readiness To Change Score:     Universal RN Interventions: Calendar Distribution: Yes Exercise Review: No Medications: Yes Medication Changes: Yes Mood: Yes Pain: Yes PCP Advocacy/Support: Yes Fall Prevention: Yes Incontinence: Yes Clinician View Of Client Situation: Patient is awake and alert.  Very direct with her request.  Sleeps in spare bedroom due to the bed being too high in the master.  Reports she uses both bathrooms in the home.  Ambulates leaning over her walker.  Caregiver present at the beginning of home visit. Client View Of His/Her Situation: Patient reports that she has no transportation or meals on wheels. Reports difficulty with using her lancet device.  Reports she wants to get her medications all by mail order.  Reports weakness in her hands that is worsening due to arthritis.  Healthcare Provider Communication: Did Higher education careers adviser With [employer] Provider?: Yes Method Of Communication: Teacher, early years/pre of Client Situation: Clinician View Of Client Situation: Patient is awake and alert.  Very direct with her request.  Sleeps in spare bedroom due to the bed being too high in the master.  Reports she uses both bathrooms in the home.  Ambulates leaning over her walker.  Caregiver present at the beginning of home visit. Client's View of His/Her Situation: Client View Of His/Her Situation: Patient reports that she has no transportation or meals on wheels. Reports difficulty with using her lancet device.  Reports she wants to get her medications all by mail order.  Reports weakness in her hands that is worsening due to arthritis.  Medication Assessment: Do You Have Any Problems Paying For Medications?: No Where Does Client Store Medications?: Kitchen  Table Can Client Read Pill Bottles?: Yes Does Client Use A Pillbox?: No Does Anyone Assist Client In Taking Medications?: No Do You Take Vitamin D?: Yes Does Client Have Any Questions Or Concerns About Medictions?: No Is Client Complaining Of Any Symptoms That Could Be Side Effects To Medications?: No Any Possible Changes In Medication Regimen?: No Outpatient Encounter Medications as of 04/10/2021  Medication Sig   ACCU-CHEK GUIDE test strip USE 1 STRIP TO CHECK GLUCOSE UP TO FOUR TIMES DAILY   Accu-Chek Softclix Lancets lancets    acetaminophen (TYLENOL) 500 MG tablet Take 1,000 mg by mouth daily as needed for moderate pain.   apixaban (ELIQUIS) 5 MG TABS tablet Take 1 tablet (5 mg total) by mouth 2 (two) times daily.   Ascorbic Acid (VITAMIN C) 500 MG tablet Take 500 mg by mouth daily.   atorvastatin (LIPITOR) 10 MG tablet TAKE 1 TABLET BY MOUTH ONCE DAILY AT 6PM   blood glucose meter kit and supplies KIT Dispense based on patient and insurance preference. Use up to four times daily as directed. (FOR E11.9).   brimonidine (ALPHAGAN) 0.2 % ophthalmic solution Place 1 drop into the right eye 2 (two) times daily.   calcium carbonate (OS-CAL) 600 MG TABS Take 600 mg by mouth daily.   Cod Liver Oil 1000 MG CAPS Take 1,000 mg by mouth daily.    Coenzyme Q10 (CO Q-10) 200 MG CAPS Take 2 each by mouth daily in the afternoon.   DORZOLAMIDE HCL-TIMOLOL MAL OP Place 1 drop into the right eye 2 (two) times daily.   Dulaglutide (TRULICITY) 1.5 GY/6.5LD  SOPN Inject 1.5 mg into the skin once a week.   gabapentin (NEURONTIN) 300 MG capsule Take 1 capsule (300 mg total) by mouth daily.   hydrochlorothiazide (HYDRODIURIL) 12.5 MG tablet Take 12.5 mg by mouth daily as needed (swelling).   insulin aspart (NOVOLOG) 100 UNIT/ML FlexPen Patient Sig: 0-15 Units, Subcutaneous, 3 times daily with meals.  CBG < 70: Implement Hypoglycemia measures, call MD.  CBG 70 - 120: 0 units.   CBG 121 - 150: 2 units.  CBG 151 -  200: 3 units.  CBG 201 - 250: 5 units.  CBG . 251 - 300: 8 units.  CBG 301 - 350: 11 units.  CBG 351 - 400: 15 units.  CBG > 400: call MD   insulin glargine (LANTUS SOLOSTAR) 100 UNIT/ML Solostar Pen Inject 10 Units into the skin daily. (Patient taking differently: Inject 8 Units into the skin daily.)   Insulin Pen Needle (BD AUTOSHIELD DUO) 30G X 5 MM MISC USE AS DIRECTED WITH INSULIN PEN NEEDLE 4 TIMES DAILY   latanoprost (XALATAN) 0.005 % ophthalmic solution Place 1 drop into both eyes at bedtime.   lisinopril (ZESTRIL) 40 MG tablet Take 1 tablet (40 mg total) by mouth daily.   Multiple Vitamin (MULTIVITAMIN) tablet Take 1 tablet by mouth daily. Prn   nitroGLYCERIN (NITROSTAT) 0.4 MG SL tablet DISSOLVE ONE TABLET UNDER TONGUE AS DIRECTED FOR CHEST PAIN (Patient taking differently: Place 0.4 mg under the tongue every 5 (five) minutes as needed for chest pain.)   Turmeric 500 MG CAPS Take 1 capsule by mouth daily in the afternoon.   cephALEXin (KEFLEX) 500 MG capsule Take 1 capsule (500 mg total) by mouth 2 (two) times daily. (Patient not taking: Reported on 04/10/2021)   Glucerna (GLUCERNA) LIQD Take 237 mLs by mouth daily. (Patient not taking: Reported on 04/10/2021)   HYDROcodone-acetaminophen (NORCO/VICODIN) 5-325 MG tablet Take 1 tablet by mouth daily as needed for moderate pain (chronic foot pain). (Patient not taking: Reported on 04/10/2021)   lisinopril (ZESTRIL) 20 MG tablet Take 1 tablet (20 mg total) by mouth daily.   mupirocin ointment (BACTROBAN) 2 % Apply to right great toe once daily (Patient not taking: Reported on 04/10/2021)   omeprazole (PRILOSEC) 20 MG capsule Take 1 capsule (20 mg total) by mouth daily.   No facility-administered encounter medications on file as of 04/10/2021.     OT Update: pending community housing solution assessment  Session Summary: Patient has Caregiver 11-1pm  MWF to help around the house. ( Does not do grocery shopping or take to appointments)  Patient  request help with transportation, mobile meals, lancet device.  Patient is also interested in getting all medications mail order due to no transportation.   Small bathrooms, home with a lot of tripping hazards.   Requested MD consider CCM services for client.   Goals Addressed               This Visit's Progress     Patient Stated (pt-stated)        Aging Gracefully RN Goals:  Goal: Patient will report better understanding of how to use her lancet device in the next 120 days.  04/10/2021 Assessment: Patient is not able to use the lancet device that goes with her Accu chek monitor.  She not able to get the cap off where the lancet is. Reports she has told the MD office and they don't understand. Patient is currently using One Touch Ultrea 2- Delica plus Lancet, She is  able to manage this lancet but needs a replacement.  Interventions:  Demonstrated many times with patient how to remove cap for lanet device and she is not able to do this activity due to her hand strength.  PLAN: in basket message sent to MD to order CCM services. Will see patient again in 1 month.  Tomasa Rand RN, BSN, CEN RN Case Freight forwarder for [facility] [employer]: [phone]       Patient Stated        Aging Gracefully RN Goal:  Goal: Patient will report talking with social worker and getting help with transportation problems in the next 60 days.  04/10/2021 Assessment:  Patient reports that she does not drive and that her son is a truck driver and has limited availability to take her to the store and MD appointment. Past due for an eye exam. Needs a foot recheck and needs to be able to get groceries. Interventions: will inquire if MD will order CCM services to assist client. Plan: Follow up in 1 month.   Tomasa Rand RN, BSN, CEN RN Case Freight forwarder for [facility] [employer]: [phone]        Patient Stated        Aging Gracefully RN  Goal: Patient will  report no falls in the next 120 days.  04/10/2021 Assessment: Patient uses a rolling walker.  She has throw rugs all over her home, which she reports is no problem.  Noted that patient leans over her walker due to back pain. Patient has an upright walker that she occasionally uses. Interventions: Reviewed fall precaustions Plan: will follow up in 1 month  Tomasa Rand RN, BSN, Careers information officer for [facility]: [phone]

## 2021-04-11 ENCOUNTER — Other Ambulatory Visit: Payer: Self-pay | Admitting: Internal Medicine

## 2021-04-11 DIAGNOSIS — E1142 Type 2 diabetes mellitus with diabetic polyneuropathy: Secondary | ICD-10-CM

## 2021-04-11 DIAGNOSIS — I10 Essential (primary) hypertension: Secondary | ICD-10-CM

## 2021-04-11 DIAGNOSIS — I5033 Acute on chronic diastolic (congestive) heart failure: Secondary | ICD-10-CM

## 2021-04-11 MED ORDER — LANCET DEVICE MISC: 0 refills | Status: DC

## 2021-04-18 ENCOUNTER — Telehealth: Payer: Self-pay | Admitting: *Deleted

## 2021-04-18 NOTE — Chronic Care Management (AMB) (Signed)
°  Chronic Care Management   Outreach Note  04/18/2021 Name: Rebecca [name] MRN: [id number] DOB: [birth year]  Rebecca [name] is a 88 y.o. year old female who is a primary care patient of Burns, [name], MD. I reached out to Rebecca [name] by phone today in response to a referral sent by [name]'s primary care provider.  An unsuccessful telephone outreach was attempted today. The patient was referred to the case management team for assistance with care management and care coordination.   Follow Up Plan: A HIPAA compliant phone message was left for the patient providing contact information and requesting a return call.  The care management team will reach out to the patient again over the next 7 days.  If patient returns call to provider office, please advise to call [place]* at [phone].*  [facility]  Direct Dial: [phone]

## 2021-04-19 ENCOUNTER — Telehealth: Payer: Self-pay

## 2021-04-19 DIAGNOSIS — E1142 Type 2 diabetes mellitus with diabetic polyneuropathy: Secondary | ICD-10-CM

## 2021-04-19 DIAGNOSIS — Z794 Long term (current) use of insulin: Secondary | ICD-10-CM

## 2021-04-19 MED ORDER — ONETOUCH ULTRASOFT LANCETS MISC: 12 refills | Status: DC

## 2021-04-19 MED ORDER — ACCU-CHEK GUIDE VI STRP: ORAL_STRIP | 11 refills | Status: DC

## 2021-04-19 NOTE — Telephone Encounter (Signed)
Spoke with patient,   Requesting accucheck strips refill along with once touch lancets to be sent [facility]   Thanks,  Jesusita Oka

## 2021-04-19 NOTE — Addendum Note (Signed)
Addended by: Pincus Sanes on: 04/19/2021 12:21 PM   Modules accepted: Orders

## 2021-04-19 NOTE — Chronic Care Management (AMB) (Signed)
Chronic Care Management   Note  04/19/2021 Name: [name] MRN: [id number] DOB: 07/05/1932  [name] is a 88 y.o. year old female who is a primary care patient of Burns, [name], MD. I reached out to [name] by phone today in response to a referral sent by Ms. Folcroft PCP.  Ms. Manahan was given information about Chronic Care Management services today including:  CCM service includes personalized support from designated clinical staff supervised by her physician, including individualized plan of care and coordination with other care providers 24/7 contact phone numbers for assistance for urgent and routine care needs. Service will only be billed when office clinical staff spend 20 minutes or more in a month to coordinate care. Only one practitioner may furnish and bill the service in a calendar month. The patient may stop CCM services at any time (effective at the end of the month) by phone call to the office staff. The patient is responsible for co-pay (up to 20% after annual deductible is met) if co-pay is required by the individual health plan.   Patient agreed to services and verbal consent obtained.   Follow up plan: Telephone appointment with care management team member scheduled for:04/21/21  [facility]  Direct Dial: [phone]

## 2021-04-20 ENCOUNTER — Telehealth: Payer: Self-pay

## 2021-04-20 NOTE — Telephone Encounter (Signed)
° °  Telephone encounter was:  Unsuccessful.  04/20/2021 Name: Rebecca [name] MRN: [id number] DOB: 06/02/1932  Unsuccessful outbound call made today to assist with:  Transportation Needs  and Food Insecurity  Outreach Attempt:  1st Attempt  A HIPAA compliant voice message was left requesting a return call.  Instructed patient to call back at [phone] at their earliest convenience.  [facility] management  [place], [place]  Main Phone: [phone]   E-mail: [email or web address]  Website: [email or web address]

## 2021-04-21 ENCOUNTER — Ambulatory Visit (INDEPENDENT_AMBULATORY_CARE_PROVIDER_SITE_OTHER): Payer: Medicare HMO | Admitting: *Deleted

## 2021-04-21 ENCOUNTER — Telehealth: Payer: Self-pay

## 2021-04-21 DIAGNOSIS — I5033 Acute on chronic diastolic (congestive) heart failure: Secondary | ICD-10-CM

## 2021-04-21 DIAGNOSIS — Z794 Long term (current) use of insulin: Secondary | ICD-10-CM

## 2021-04-21 NOTE — Telephone Encounter (Signed)
° °  Telephone encounter was:  Successful.  04/21/2021 Name: Rebecca [name] MRN: [id number] DOB: 1932/09/27  Rebecca [name] is a 88 y.o. year old female who is a primary care patient of Burns, [name], MD . The community resource team was consulted for assistance with Transportation Needs , Food Insecurity, and Financial Difficulties related to medical bills  Care guide performed the following interventions:  Patient called back advising she needs assistance with transportation and medical bills. Pt advised she was on meals on wheels but stated meals stopped due to her being in rehab for awhile.  Follow Up Plan:  Care guide will outreach resources to assist patient with checking status on meals on wheels, reaching out to Rebecca [name] about transportation and finding resources for medical bills.  [facility] Guide, Embedded Care Coordination [facility]  [place], [place] [address]  Main Phone: [phone]   E-mail: [name]  Website: [email or web address]

## 2021-04-21 NOTE — Chronic Care Management (AMB) (Signed)
Chronic Care Management   CCM RN Visit Note  04/21/2021 Name: Rebecca [name] MRN: [id number] DOB: 21-Feb-1933  Subjective: Rebecca [name] is a 88 y.o. year old female who is a primary care patient of Burns, [name], MD. The care management team was consulted for assistance with disease management and care coordination needs.    Engaged with patient by telephone for initial visit in response to provider referral for case management and/or care coordination services.   Consent to Services:  The patient was given information about Chronic Care Management services, agreed to services, and gave verbal consent 04/19/21 prior to initiation of services.  Please see initial visit note for detailed documentation.  Patient agreed to services and verbal consent obtained.   Assessment: Review of patient past medical history, allergies, medications, health status, including review of consultants reports, laboratory and other test data, was performed as part of comprehensive evaluation and provision of chronic care management services.   SDOH (Social Determinants of Health) assessments and interventions performed:  SDOH Interventions    Flowsheet Row Most Recent Value  SDOH Interventions   Food Insecurity Interventions Other (Comment)  [Confirmed CCM Community Resource Care Guide involved]  Financial Strain Interventions Other (Comment)  [Confirmed CCM Community Resource Care Guide involved]  Housing Interventions Intervention Not Indicated  [Single family one level home x 38 years,  lives alone,  Confirmed patient currently participating in [place] through [employer] for home safety modifcations]  Transportation Interventions Other (Comment)  [Confirmed [place] involved]     CCM Care Plan  Allergies  Allergen Reactions   Codeine     "Makes me drunk"   Outpatient Encounter Medications as of 04/21/2021  Medication Sig    acetaminophen (TYLENOL) 500 MG tablet Take 1,000 mg by mouth daily as needed for moderate pain.   apixaban (ELIQUIS) 5 MG TABS tablet Take 1 tablet (5 mg total) by mouth 2 (two) times daily.   Ascorbic Acid (VITAMIN C) 500 MG tablet Take 500 mg by mouth daily.   atorvastatin (LIPITOR) 10 MG tablet TAKE 1 TABLET BY MOUTH ONCE DAILY AT 6PM   blood glucose meter kit and supplies KIT Dispense based on patient and insurance preference. Use up to four times daily as directed. (FOR E11.9).   brimonidine (ALPHAGAN) 0.2 % ophthalmic solution Place 1 drop into the right eye 2 (two) times daily.   calcium carbonate (OS-CAL) 600 MG TABS Take 600 mg by mouth daily.   Cod Liver Oil 1000 MG CAPS Take 1,000 mg by mouth daily.    Coenzyme Q10 (CO Q-10) 200 MG CAPS Take 2 each by mouth daily in the afternoon.   DORZOLAMIDE HCL-TIMOLOL MAL OP Place 1 drop into the right eye 2 (two) times daily.   Dulaglutide (TRULICITY) 1.5 YP/9.5KD SOPN Inject 1.5 mg into the skin once a week.   gabapentin (NEURONTIN) 300 MG capsule Take 1 capsule (300 mg total) by mouth daily.   Glucerna (GLUCERNA) LIQD Take 237 mLs by mouth daily. (Patient not taking: Reported on 04/10/2021)   glucose blood (ACCU-CHEK GUIDE) test strip USE 1 STRIP TO CHECK GLUCOSE UP TO FOUR TIMES DAILY   hydrochlorothiazide (HYDRODIURIL) 12.5 MG tablet Take 12.5 mg by mouth daily as needed (swelling).   HYDROcodone-acetaminophen (NORCO/VICODIN) 5-325 MG tablet Take 1 tablet by mouth daily as needed for moderate pain (chronic foot pain). (Patient not taking: Reported on 04/10/2021)   insulin aspart (NOVOLOG) 100 UNIT/ML FlexPen Patient Sig: 0-15  Units, Subcutaneous, 3 times daily with meals.  CBG < 70: Implement Hypoglycemia measures, call MD.  CBG 70 - 120: 0 units.   CBG 121 - 150: 2 units.  CBG 151 - 200: 3 units.  CBG 201 - 250: 5 units.  CBG . 251 - 300: 8 units.  CBG 301 - 350: 11 units.  CBG 351 - 400: 15 units.  CBG > 400: call MD   insulin glargine (LANTUS  SOLOSTAR) 100 UNIT/ML Solostar Pen Inject 10 Units into the skin daily. (Patient taking differently: Inject 8 Units into the skin daily.)   Insulin Pen Needle (BD AUTOSHIELD DUO) 30G X 5 MM MISC USE AS DIRECTED WITH INSULIN PEN NEEDLE 4 TIMES DAILY   Lancet Device MISC One Touch ultra 2 Delica plus lancet device. Use as directed to monitor sugars.  E11.42   Lancets (ONETOUCH ULTRASOFT) lancets Use as instructed   latanoprost (XALATAN) 0.005 % ophthalmic solution Place 1 drop into both eyes at bedtime.   lisinopril (ZESTRIL) 20 MG tablet Take 1 tablet (20 mg total) by mouth daily.   lisinopril (ZESTRIL) 40 MG tablet Take 1 tablet (40 mg total) by mouth daily.   Multiple Vitamin (MULTIVITAMIN) tablet Take 1 tablet by mouth daily. Prn   mupirocin ointment (BACTROBAN) 2 % Apply to right great toe once daily (Patient not taking: Reported on 04/10/2021)   nitroGLYCERIN (NITROSTAT) 0.4 MG SL tablet DISSOLVE ONE TABLET UNDER TONGUE AS DIRECTED FOR CHEST PAIN (Patient taking differently: Place 0.4 mg under the tongue every 5 (five) minutes as needed for chest pain.)   omeprazole (PRILOSEC) 20 MG capsule Take 1 capsule (20 mg total) by mouth daily.   Turmeric 500 MG CAPS Take 1 capsule by mouth daily in the afternoon.   No facility-administered encounter medications on file as of 04/21/2021.   Patient Active Problem List   Diagnosis Date Noted   Paroxysmal A-fib ([place]) 01/12/2020   H/O thyroid storm 01/06/2020   Spinal stenosis 01/06/2020   Chronic kidney disease, stage 3a ([place]) 01/03/2020   Microcytic anemia 01/03/2020   Acute on chronic diastolic CHF (congestive heart failure) ([place]) 01/03/2020   Physical deconditioning 01/03/2020   Generalized osteoarthritis of hand 06/01/2019   Peripheral neuropathy 06/01/2019   Degenerative arthritis of right knee 12/15/2018   Knee osteoarthritis 09/08/2018   Pes planus 12/01/2017   Foot deformity, acquired, left 11/04/2017   Degenerative disc disease, lumbar  08/01/2016   Glaucoma 07/23/2016   Numbness 04/23/2016   Chronic pain of right knee 12/28/2015   Lower back pain 10/24/2015   Pain in both feet 10/24/2015   Abnormality of gait 04/01/2013   Posterior tibial tendon dysfunction 04/01/2013   Diabetes ([place]) 04/01/2013   Ankle pain, left 10/20/2010   UNSPECIFIED VITAMIN D DEFICIENCY 11/08/2008   EDEMA- LOCALIZED 11/08/2008   Sickle-cell trait ([place]) 01/01/2008   Esophageal reflux 09/02/2007   GOITER, MULTINODULAR 11/26/2006   Coronary artery disease involving native coronary artery of native heart without angina pectoris 11/26/2006   HIATAL HERNIA 11/26/2006   HYPERCHOLESTEROLEMIA 05/27/2006   Essential hypertension 05/27/2006   Conditions to be addressed/monitored:  CHF and DMII  Care Plan : RN Care Manager Plan of Care  Updates made by Knox Royalty, RN since 04/21/2021 12:00 AM     Problem: Chronic Disease Management Needs   Priority: High     Long-Range Goal: Development of plan of care for long term chronic disease management   Start Date: 04/21/2021  Expected End Date: 04/21/2022  Priority: High  Note:   Current Barriers:  Chronic Disease Management support and education needs related to CHF and DMII Lives alone in single family one level home; adult son lives next door and assists 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, 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-- for home safety modifications/ interventions Has ramp outside back entry-- reports does not use front entry "at all" Reports has paid caregiver 3 x/ week through "[employer];" states caregiver assists with household chores primarily, occasionally assists patient with hygiene; patient reports she is not happy with her current caregiver because she brings her "infant grandchild" along with her, which makes patient uncomfortable: encouraged patient to follow up with this agency about her concerns; she  states she has been planning  and will do this in the near future High fall risk due to impaired mobility (uses walker) and chronic pain/ potential for hypoglycemia SDOH needs for transportation/ food/ financial resources: confirmed [place] currently involved Pharmacy needs/ assistance: confirmed CCM Pharmacy team active/ involved  RNCM Clinical Goal(s):  Patient will demonstrate ongoing health management independence as evidenced by adherence to plan of care for DMII and CHF        through collaboration with Consulting civil engineer, provider, and care team.   Interventions: 1:1 collaboration with primary care provider regarding development and update of comprehensive plan of care as evidenced by provider attestation and co-signature Inter-disciplinary care team collaboration (see longitudinal plan of care) Evaluation of current treatment plan related to  self management and patient's adherence to plan as established by provider 05/01/21- CCM RN CM initial assessment completed Discussed medications: patient confirms CCM Pharmacy team involved; I confirmed for patient that [facility] team has placed order per her request for mail-order medications and that CCM Pharmacist has requested alternate lancets due to patient's inability to use current lancets; patient asks that I remind pharmacist that she wants 90-day prescriptions-- I will message Dan per her request today; patient reports independent in medication administration; she denies concerns around medications-- encouraged her ongoing engagement with CCM Pharmacy team around medication needs Falls assessment completed: patient denies falls x 12 months; uses walker "all the time;" confirms she is working with [facility] team through [employer] aorund fall prevention and home safety modifications; education around fall risks/ prevention reinforced SDOH updated: confirmed [place] currently involved-- explained to  patient that CR CG has attempted to contact her to provide resources: care guide phone number was provided to patient; encouraged her to listen for care guide phone call and to promptly act on resources once they are provided: she is agreeable and verbalizes understanding Pain assessment updated: today, patient denies pain, states "I am sitting quietly;" however she acknowledges chronic ongoing persistent pain due to osteoarthritis; reports "managed pretty well," by OTC medications (tylenol); reports she does not take medications stronger than tylenol for her chronic pain  Heart Failure Interventions:  (Status: 04/21/21: New goal.)  Long Term Goal  Wt Readings from Last 3 Encounters:  03/03/21 179 lb 3.2 oz (81.3 kg)  01/13/21 183 lb (83 kg)  11/16/20 184 lb 12.8 oz (83.8 kg)  Basic overview and discussion of pathophysiology of Heart Failure reviewed Provided education on low sodium diet Assessed need for readable accurate scales in home Discussed the importance of keeping all appointments with provider Provided patient with education about the role of exercise in the management of heart failure Confirmed patient unable to  perform daily weights at home: does not have scale; reports she would not be able to safely perform daily weights due to being fall risk Provided education around signs/ symptoms CHF yellow zone along with corresponding action plan for same: patient denies signs/ symptoms today, sounds to be in no distress throughout our phone call today Confirmed that patient follows "health diet;" and "does not ever eat added salt;" confirms she continues to do most of her own cooking  Diabetes:  (Status: 05/01/21: New goal.) Long Term Goal   Lab Results  Component Value Date   HGBA1C 5.3 01/13/2021    Assessed patient's understanding of A1c goal: <6.5% Provided education to patient about basic DM disease process; Reviewed prescribed diet with patient heart healthy, low salt, low  cholesterol, carbohydrate modified/ low sugar; Counseled on importance of regular laboratory monitoring as prescribed;        Discussed plans with patient for ongoing care management follow up and provided patient with direct contact information for care management team;      Reviewed scheduled/upcoming provider appointments including: 04/28/21- podiatry for [place]; 05/31/21- CCM pharmacy team; 05/17/21- [facility] team; 05/23/21- podiatry; 07/14/21- PCP;         Review of patient status, including review of consultants reports, relevant laboratory and other test results, and medications completed;       Confirmed patient has been monitoring and continues to monitor blood sugars at home BID: fasting and before last meal of day Reviewed fasting blood sugars: she reports consistent fasting values between 90-110 with a value this morning of 95 Reviewed evening blood sugars: she reports consistent values between 90-154; reports "rare" isolated highs in "the 200's;" but states that only happened when someone else cooks OR on the rare occasions she goes out to eat, which is not frequent Confirmed no recent episodes hypoglycemia: patient aware of signs/ symptoms hypoglycemia and we discussed corresponding action plan for same- patient will benefit from ongoing reinforcement/ support Confirmed patient aware of/ understands PCP goals to keep blood sugars at home > 100 Confirmed patient able to verbalize accurate understand of insulin dosing at home: taking 8 U long-acting insulin QD; reports has not needed sliding scale meal time insulin "in quite awhile" Assessed patient's understanding of meaning/ significance of A1-C values: good baseline understanding of same- reviewed individual historical A1-C trends and provided education around correlation of A1-C value to blood sugar levels at home over 3 months  Patient Goals/Self-Care Activities: As evidenced by review of EHR, collaboration with care team, and  patient reporting during CCM RN CM outreach,  Patient [place] will: Take medications as prescribed Attend all scheduled provider appointments Call pharmacy for medication refills Call provider office for new concerns or questions Continue to check fasting (first thing in the morning, before eating) and then again later in day, blood sugars at home Continue to follow heart healthy, low salt, low cholesterol, carbohydrate-modified, low sugar diet Keep up the great work avoiding falls in your home-- work with the [facility]) team to make modifications to your home and to learn ways you can specifically prevent falls; keep using your walker and taking your time; don't rush to get tasks completed Talk to the [employer] about resources that may be available to you for food acquisition, financial assistance with bills, and transportation-- once you have been provided with the resources, take steps to reach out and get the resources in place Review enclosed educational material  Plan: Telephone follow up appointment with care management team member scheduled for:  Friday, May 26, 2021 at 2:30 pm The patient has been provided with contact information for the care management team and has been advised to call with any health related questions or concerns  [name], RN, BSN, [place] [phone]: direct office

## 2021-04-21 NOTE — Patient Instructions (Signed)
Visit Information   Shye, thank you for taking time to talk with me today. Please don't hesitate to contact me if I can be of assistance to you before our next scheduled telephone appointment.  Below are the goals we discussed today:  Patient Self-Care Activities: Patient Rebecca [name] will: Take medications as prescribed Attend all scheduled provider appointments Call pharmacy for medication refills Call provider office for new concerns or questions Continue to check fasting (first thing in the morning, before eating) and then again later in day, blood sugars at home Continue to follow heart healthy, low salt, low cholesterol, carbohydrate-modified, low sugar diet Keep up the great work avoiding falls in your home-- work with the [facility]) team to make modifications to your home and to learn ways you can specifically prevent falls; keep using your walker and taking your time; don't rush to get tasks completed Talk to the [facility] about resources that may be available to you for food acquisition, financial assistance with bills, and transportation-- once you have been provided with the resources, take steps to reach out and get the resources in place Review enclosed educational material   Our next scheduled telephone follow up visit/ appointment with care management team member is scheduled on: Friday, May 26, 2021 at 2:30 pm- This is a PHONE [place] appointment  If you need to cancel or re-schedule our visit, please call [phone] and our care guide team will be happy to assist you.   I look forward to hearing about your progress.   [name], RN, BSN, [place] [phone]: direct office  If you are experiencing a Mental Health or [place] or need someone to talk to, please  call the Suicide and Crisis Lifeline: 988 call the [place] National  Suicide Prevention Lifeline: 938-545-1544 or TTY: 930-056-6763 TTY (628)234-0382) to talk to a trained counselor call 1-800-273-TALK (toll free, 24 hour hotline) go to [facility] [place] [phone]) call 911   Hypoglycemia Hypoglycemia occurs when the level of sugar (glucose) in the blood is too low. Hypoglycemia can happen in people who have or do not have diabetes. It can develop quickly, and it can be a medical emergency. For most people, a blood glucose level below 70 mg/dL (3.9 mmol/L) is considered hypoglycemia. Glucose is a type of sugar that provides the body's main source of energy. Certain hormones (insulin and glucagon) control the level of glucose in the blood. Insulin lowers blood glucose, and glucagon raises blood glucose. Hypoglycemia can result from having too much insulin in the bloodstream, or from not eating enough food that contains glucose. You may also have reactive hypoglycemia, which happens within 4 hours after eating a meal. What are the causes? Hypoglycemia occurs most often in people who have diabetes and may be caused by: Diabetes medicine. Not eating enough, or not eating often enough. Increased physical activity. Drinking alcohol on an empty stomach. If you do not have diabetes, hypoglycemia may be caused by: A tumor in the pancreas. Not eating enough, or not eating for long periods at a time (fasting). A severe infection or illness. Problems after having bariatric surgery. Organ failure, such as kidney or liver failure. Certain medicines. What increases the risk? Hypoglycemia is more likely to develop in people who: Have diabetes and take medicines to lower blood glucose. Abuse alcohol. Have a severe illness. What are the signs  or symptoms? Symptoms vary depending on whether the condition is mild, moderate, or severe. Mild hypoglycemia Hunger. Sweating and feeling clammy. Dizziness or feeling  light-headed. Sleepiness or restless sleep. Nausea. Increased heart rate. Headache. Blurry vision. Mood changes, such as irritability or anxiety. Tingling or numbness around the mouth, lips, or tongue. Moderate hypoglycemia Confusion and poor judgment. Behavior changes. Weakness. Irregular heartbeat. A change in coordination. Severe hypoglycemia Severe hypoglycemia is a medical emergency. It can cause: Fainting. Seizures. Loss of consciousness (coma). Death. How is this diagnosed? Hypoglycemia is diagnosed with a blood test to measure your blood glucose level. This blood test is done while you are having symptoms. Your health care provider may also do a physical exam and review your medical history. How is this treated? This condition can be treated by immediately eating or drinking something that contains sugar with 15 grams of fast-acting carbohydrate, such as: 4 oz (120 mL) of fruit juice. 4 oz (120 mL) of regular soda (not diet soda). Several pieces of hard candy. Check food labels to find out how many pieces to eat for 15 grams. 1 Tbsp (15 mL) of sugar or honey. 4 glucose tablets. 1 tube of glucose gel. Treating hypoglycemia if you have diabetes If you are alert and able to swallow safely, follow the 15:15 rule: Take 15 grams of a fast-acting carbohydrate. Talk with your health care provider about how much you should take. Options for getting 15 grams of fast-acting carbohydrate include: Glucose tablets (take 4 tablets). Several pieces of hard candy. Check food labels to find out how many pieces to eat for 15 grams. 4 oz (120 mL) of fruit juice. 4 oz (120 mL) of regular soda (not diet soda). 1 Tbsp (15 mL) of sugar or honey. 1 tube of glucose gel. Check your blood glucose 15 minutes after you take the carbohydrate. If the repeat blood glucose level is still at or below 70 mg/dL (3.9 mmol/L), take 15 grams of a carbohydrate again. If your blood glucose level does not  increase above 70 mg/dL (3.9 mmol/L) after 3 tries, seek emergency medical care. After your blood glucose level returns to normal, eat a meal or a snack within 1 hour.  Treating severe hypoglycemia Severe hypoglycemia is when your blood glucose level is below 54 mg/dL (3 mmol/L). Severe hypoglycemia is a medical emergency. Get medical help right away. If you have severe hypoglycemia and you cannot eat or drink, you will need to be given glucagon. A family member or close friend should learn how to check your blood glucose and how to give you glucagon. Ask your health care provider if you need to have an emergency glucagon kit available. Severe hypoglycemia may need to be treated in a hospital. The treatment may include getting glucose through an IV. You may also need treatment for the cause of your hypoglycemia. Follow these instructions at home: General instructions Take over-the-counter and prescription medicines only as told by your health care provider. Monitor your blood glucose as told by your health care provider. If you drink alcohol: Limit how much you have to: 0-1 drink a day for women who are not pregnant. 0-2 drinks a day for men. Know how much alcohol is in your drink. In the [place] one drink equals one 12 oz bottle of beer (355 mL), one 5 oz glass of wine (148 mL), or one 1 oz glass of hard liquor (44 mL). Be sure to eat food along with drinking alcohol. Be aware that alcohol  is absorbed quickly and may have lingering effects that may result in hypoglycemia later. Be sure to do ongoing glucose monitoring. Keep all follow-up visits. This is important. If you have diabetes: Always have a fast-acting carbohydrate (15 grams) option with you to treat low blood glucose. Follow your diabetes management plan as directed by your health care provider. Make sure you: Know the symptoms of hypoglycemia. It is important to treat it right away to prevent it from becoming severe. Check your  blood glucose as often as told. Always check before and after exercise. Always check your blood glucose before you drive a motorized vehicle. Take your medicines as told. Follow your meal plan. Eat on time, and do not skip meals. Share your diabetes management plan with people in your workplace, school, and household. Carry a medical alert card or wear medical alert jewelry. Where to find more information American Diabetes Association: www.diabetes.org Contact a health care provider if: You have problems keeping your blood glucose in your target range. You have frequent episodes of hypoglycemia. Get help right away if: You continue to have hypoglycemia symptoms after eating or drinking something that contains 15 grams of fast-acting carbohydrate, and you cannot get your blood glucose above 70 mg/dL (3.9 mmol/L) while following the 15:15 rule. Your blood glucose is below 54 mg/dL (3 mmol/L). You have a seizure. You faint. These symptoms may represent a serious problem that is an emergency. Do not wait to see if the symptoms will go away. Get medical help right away. Call your local emergency services (911 in the [place] Do not drive yourself to the hospital. Summary Hypoglycemia occurs when the level of sugar (glucose) in the blood is too low. Hypoglycemia can happen in people who have or do not have diabetes. It can develop quickly, and it can be a medical emergency. Make sure you know the symptoms of hypoglycemia and how to treat it. Always have a fast-acting carbohydrate option with you to treat low blood sugar. This information is not intended to replace advice given to you by your health care provider. Make sure you discuss any questions you have with your health care provider. Document Revised: 01/28/2020 Document Reviewed: 01/28/2020 Elsevier Patient Education  2022 [employer].    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, 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-- for home safety modifications/ interventions Has ramp outside back entry-- reports does not use front entry "at all" Reports has paid caregiver 3 x/ week through "[employer];" states caregiver assists with household chores primarily, occasionally assists patient with hygiene; patient reports she is not happy with her current caregiver because she brings her "infant grandchild" along with her, which makes patient uncomfortable: encouraged patient to follow up with this agency about her concerns; she  states she has been planning and will do this in the near  future High fall risk due to impaired mobility (uses walker) and chronic pain/ potential for hypoglycemia SDOH needs for transportation/ food/ financial resources: confirmed Rebecca [name] currently involved Pharmacy needs/ assistance: confirmed CCM Pharmacy team active/ involved  RNCM Clinical Goal(s):  Patient will demonstrate ongoing health management independence as evidenced by adherence to plan of care for DMII and CHF        through collaboration with RN Care manager, provider, and care team.   Interventions: 1:1 collaboration with primary care provider regarding development and update of comprehensive plan of care as evidenced by provider attestation and co-signature Inter-disciplinary care team collaboration (see longitudinal plan of  care) Evaluation of current treatment plan related to  self management and patient's adherence to plan as established by provider 05/01/21- CCM RN CM initial assessment completed Discussed medications: patient confirms CCM Pharmacy team involved; I confirmed for patient that [facility] team has placed order per her request for mail-order medications and that CCM Pharmacist has requested alternate lancets due to patient's inability to use current lancets; patient asks that I remind pharmacist that she wants 90-day prescriptions-- I will message Dan per her request today; patient reports independent in medication administration; she denies concerns around medications-- encouraged her ongoing engagement with CCM Pharmacy team around medication needs Falls assessment completed: patient denies falls x 12 months; uses walker "all the time;" confirms she is working with [facility] team through [employer] aorund fall prevention and home safety modifications; education around fall risks/ prevention reinforced SDOH updated: confirmed Rebecca [name] currently involved-- explained to patient that CR CG has attempted to contact her to provide resources: care guide phone number was provided to patient; encouraged her to listen for care guide phone call and to promptly act on resources once they are provided: she is agreeable and verbalizes understanding Pain assessment updated: today, patient denies pain, states "I am sitting quietly;" however she acknowledges chronic ongoing persistent pain due to osteoarthritis; reports "managed pretty well," by OTC medications (tylenol); reports she does not take medications stronger than tylenol for her chronic pain  Heart Failure Interventions:  (Status: 04/21/21: New goal.)  Long Term Goal  Wt Readings from Last 3 Encounters:  03/03/21 179 lb 3.2 oz (81.3 kg)  01/13/21 183 lb (83 kg)  11/16/20 184 lb 12.8 oz (83.8 kg)  Basic overview  and discussion of pathophysiology of Heart Failure reviewed Provided education on low sodium diet Assessed need for readable accurate scales in home Discussed the importance of keeping all appointments with provider Provided patient with education about the role of exercise in the management of heart failure Confirmed patient unable to perform daily weights at home: does not have scale; reports she would not be able to safely perform daily weights due to being fall risk Provided education around signs/ symptoms CHF yellow zone along with corresponding action plan for same: patient denies signs/ symptoms today, sounds to be in no distress throughout our phone call today Confirmed that patient follows "health diet;" and "does not ever eat added salt;" confirms she continues to do most of her own cooking  Diabetes:  (Status: 05/01/21: New goal.) Long Term Goal   Lab Results  Component Value Date   HGBA1C 5.3 01/13/2021    Assessed patient's understanding of A1c goal: <6.5% Provided education to patient about basic DM disease process; Reviewed prescribed diet with patient heart healthy, low salt, low cholesterol, carbohydrate modified/ low sugar; Counseled on importance of regular  laboratory monitoring as prescribed;        Discussed plans with patient for ongoing care management follow up and provided patient with direct contact information for care management team;      Reviewed scheduled/upcoming provider appointments including: 04/28/21- podiatry for casting; 05/31/21- CCM pharmacy team; 05/17/21- [facility] team; 05/23/21- podiatry; 07/14/21- PCP;         Review of patient status, including review of consultants reports, relevant laboratory and other test results, and medications completed;       Confirmed patient has been monitoring and continues to monitor blood sugars at home BID: fasting and before last meal of day Reviewed fasting blood sugars: she reports consistent fasting values  between 90-110 with a value this morning of 95 Reviewed evening blood sugars: she reports consistent values between 90-154; reports "rare" isolated highs in "the 200's;" but states that only happened when someone else cooks OR on the rare occasions she goes out to eat, which is not frequent Confirmed no recent episodes hypoglycemia: patient aware of signs/ symptoms hypoglycemia and we discussed corresponding action plan for same- patient will benefit from ongoing reinforcement/ support Confirmed patient aware of/ understands PCP goals to keep blood sugars at home > 100 Confirmed patient able to verbalize accurate understand of insulin dosing at home: taking 8 U long-acting insulin QD; reports has not needed sliding scale meal time insulin "in quite awhile" Assessed patient's understanding of meaning/ significance of A1-C values: good baseline understanding of same- reviewed individual historical A1-C trends and provided education around correlation of A1-C value to blood sugar levels at home over 3 months  Patient Goals/Self-Care Activities: As evidenced by review of EHR, collaboration with care team, and patient reporting during CCM RN CM outreach,  Patient Rebecca [name] will: Take medications as prescribed Attend all scheduled provider appointments Call pharmacy for medication refills Call provider office for new concerns or questions Continue to check fasting (first thing in the morning, before eating) and then again later in day, blood sugars at home Continue to follow heart healthy, low salt, low cholesterol, carbohydrate-modified, low sugar diet Keep up the great work avoiding falls in your home-- work with the [facility]) team to make modifications to your home and to learn ways you can specifically prevent falls; keep using your walker and taking your time; don't rush to get tasks completed Talk to the [employer] about resources that may be  available to you for food acquisition, financial assistance with bills, and transportation-- once you have been provided with the resources, take steps to reach out and get the resources in place Review enclosed educational material       Consent to CCM Services: Ms. Schartz was given information about Chronic Care Management services 04/19/21 including:  CCM service includes personalized support from designated clinical staff supervised by her physician, including individualized plan of care and coordination with other care providers 24/7 contact phone numbers for assistance for urgent and routine care needs. Service will only be billed when office clinical staff spend 20 minutes or more in a month to coordinate care. Only one practitioner may furnish and bill the service in a calendar month. The patient may stop CCM services at any time (effective at the end of the month) by phone call to the office staff. The patient will be responsible for cost sharing (co-pay) of up to 20% of the service fee (after annual deductible is met).  Patient agreed to services and verbal consent  obtained.   The patient verbalized understanding of instructions, educational materials, and care plan provided today and agreed to receive a mailed copy of patient instructions, educational materials, and care plan.  Telephone follow up appointment with care management team member scheduled for:  Friday, May 26, 2021 at 2:30 pm The patient has been provided with contact information for the care management team and has been advised to call with any health related questions or concerns.

## 2021-04-25 ENCOUNTER — Telehealth: Payer: Self-pay

## 2021-04-25 NOTE — Telephone Encounter (Signed)
Pt is requesting a refill on: lisinopril (ZESTRIL) 40 MG tablet Supply of 90 days is requested. gabapentin (NEURONTIN) 300 MG capsule Supply of 90 days is requested. Dulaglutide (TRULICITY) 1.5 MG/0.5ML SOPN Supply of 90 days is requested  Pharmacy: [facility] - [place], [place] - [address]  LOV 01/13/21  Pt CB [phone]

## 2021-04-26 ENCOUNTER — Other Ambulatory Visit: Payer: Self-pay

## 2021-04-26 ENCOUNTER — Other Ambulatory Visit: Payer: Self-pay | Admitting: Internal Medicine

## 2021-04-26 MED ORDER — GABAPENTIN 300 MG PO CAPS: 300.0000 mg | ORAL_CAPSULE | Freq: Every day | ORAL | 2 refills | Status: DC

## 2021-04-26 MED ORDER — TRULICITY 1.5 MG/0.5ML ~~LOC~~ SOAJ: 1.5000 mg | SUBCUTANEOUS | 2 refills | Status: DC

## 2021-04-26 MED ORDER — LISINOPRIL 40 MG PO TABS: 40.0000 mg | ORAL_TABLET | Freq: Every day | ORAL | 2 refills | Status: DC

## 2021-04-26 NOTE — Telephone Encounter (Signed)
Refills sent in today for patient. 

## 2021-04-28 ENCOUNTER — Other Ambulatory Visit: Payer: Self-pay

## 2021-04-28 ENCOUNTER — Ambulatory Visit (INDEPENDENT_AMBULATORY_CARE_PROVIDER_SITE_OTHER): Payer: Medicare HMO

## 2021-04-28 DIAGNOSIS — M2141 Flat foot [pes planus] (acquired), right foot: Secondary | ICD-10-CM | POA: Diagnosis not present

## 2021-04-28 DIAGNOSIS — M21962 Unspecified acquired deformity of left lower leg: Secondary | ICD-10-CM | POA: Diagnosis not present

## 2021-04-28 DIAGNOSIS — R269 Unspecified abnormalities of gait and mobility: Secondary | ICD-10-CM

## 2021-04-28 DIAGNOSIS — M2142 Flat foot [pes planus] (acquired), left foot: Secondary | ICD-10-CM

## 2021-04-28 DIAGNOSIS — E1142 Type 2 diabetes mellitus with diabetic polyneuropathy: Secondary | ICD-10-CM

## 2021-04-28 DIAGNOSIS — M76829 Posterior tibial tendinitis, unspecified leg: Secondary | ICD-10-CM | POA: Diagnosis not present

## 2021-04-28 NOTE — Progress Notes (Signed)
SITUATION Reason for Visit: Dispensation and Fitting of [place] Patient Report: Patient reports comfort in ambulation and understands all instructions.  OBJECTIVE DATA Patient History / Diagnosis:     ICD-10-CM   1. Diabetic peripheral neuropathy associated with type 2 diabetes mellitus (HCC)  E11.42     2. Pes planus of right foot  M21.41     3. Acquired pes planovalgus of left foot  M21.42     4. Posterior tibial tendon dysfunction  M76.829     5. Foot deformity, acquired, left  M21.962       Provided Device:  Custom Molded Gauntlet: Style [place] Tall Velcro Full Foot with Insole     Apex [id number] Diabetic shoes  Goals of Orthosis: - Improve gait - Decrease energy expenditure during the gait cycle - Improve balance - Stabilize motion at ankle and subtalar joint - Compensate for muscle weakness - Facilitate motion - Provide triplanar ankle and foot stabilization for weight bearing activities  Device Justification: - Patient is ambulatory  - Device is medically necessary as part of the overall treatment due to the patient's condition and related symptoms - It is anticipated that the patient will benefit functionally with use of the device.  - The custom device is utilized in an attempt to avoid the need for surgery and because a prefabricated device is inappropriate.  Upon gait analysis, the device appeared to be fitting well and the patient states that the device is comfortable.  ACTIONS PERFORMED Patient was fit with [employer] and diabetic shoes. Patient tolerated fitting procedure. Fit of the device is good. Patient was able to apply properly and ambulate without distress. Device function is to restrict and limit motion and provide stabilization in the ankle joint.   Goals and function of this device were explained in detail to the patient. The patient was shown how to properly apply, wear, and care for the device. It was explained  that the device will fit and function best in an adjustable-closure shoe with a firm heel counter and a wide base of support. When the device was dispensed, it was suitable for the patient's condition and not substandard. No guarantees were given. Precautions were reviewed.   Written instructions, warranty information, and a copy of DMEPOS Supplier Standards were provided. All questions answered and concerns addressed.  PLAN Patient is to contact patient when diabetic insoles are ready. Plan of care was discussed with and agreed upon by patient.

## 2021-05-03 ENCOUNTER — Telehealth: Payer: Self-pay

## 2021-05-03 NOTE — Telephone Encounter (Signed)
° °  Telephone encounter was:  Successful.  05/03/2021 Name: Rebecca [name] MRN: [id number] DOB: 1932/06/24  Rebecca [name] is a 88 y.o. year old female who is a primary care patient of Burns, [name], MD . The community resource team was consulted for assistance with Transportation Needs , Food Insecurity, and Financial Difficulties related to Medical bills  Care guide performed the following interventions:  Pt has been advised that she is currently on the waiting list for meals on wheels. Coordinator Romeo Apple advised she applied in November. Regarding transportation, pt has 48-one-way trips she can use with [employer]. For resources with Medical bills, a list will be sent out too (13 total resources) .  Follow Up Plan:  Care guide will follow up with patient by phone over the next few days to ensure mail has been received.   Enclosed in mail: Land, Engineer, manufacturing for [employer], [employer] Program, [employer] information, and [employer]. Patient son Rebecca [name] has been advised of all information as well.  [facility] Guide, Embedded Care Coordination [facility]  [place], [place] [address]  Main Phone: [phone]   E-mail: [email or web address]  Website: [email or web address]

## 2021-05-08 ENCOUNTER — Other Ambulatory Visit: Payer: Self-pay

## 2021-05-08 ENCOUNTER — Other Ambulatory Visit: Payer: Self-pay | Admitting: Occupational Therapy

## 2021-05-08 NOTE — Patient Outreach (Signed)
Aging Gracefully Program  OT Follow-Up Visit  05/08/2021  SHADY PADRON [birth year] [id number]  Visit:  2- Second Visit  Start Time:  0915 End Time:  0958 Total Minutes:  43        Readiness to Change Score :  Readiness to Change Score: 7.67   Durable Medical Equipment: Durable Medical Equipment: Elevated Commode Seat Durable Medical Equipment Distribution Date: 05/08/21  Patient Education: Education Provided: Yes Education Details: Educated on use of raised toilet seat, how to remove and re-install if necessary at any point. Person(s) Educated: Patient Comprehension: Verbalized Understanding, Returned Demonstration  Goals:   Goals Addressed             This Visit's Progress    COMPLETED: Patient stated       Pt will improve safety and independence in getting on and off the commode.   ACTION PLANNING - FUNCTIONAL MOBILITY Target Problem Area: Getting up from and down to the commode  Why Problem May Occur:  -low commode height  -limited LB mobility  -limited options for holding-grab bars, countertop, etc.   Target Goal: To get up from and down to commode safely and independently    STRATEGIES Saving Your Energy: DO: DON'T:  Take breaks  Don't rush  Raise the height of surfaces -install raised toilet seat   Remove tripping hazards     Modifying your home environment and making it safe: DO: DON'T:  Install grab bars in the bathroom Hold onto unsafe surfaces (towel racks, shower curtains, soap dishes, etc)  Remove or strongly secure throw rugs    Provide adequate lighting Use dim lights or lights that cast a lot of shadows  Simplifying the way you set up tasks or daily routines: DO: DON'T:  Move slowly Rush during transfers or walking    Practice It is important to practice the strategies so we can determine if they will be effective in helping to reach your goal. Follow these specific recommendations:  1. Install raised commode and grab bars  if possible 2. Use raised toilet seat with handles if needed 3. Use non-slip bath mats at commode if you need a mat  If a strategy does not work the first time, try it again and again (and maybe again). We may make some changes over the next few sessions, based on how they work.              Post Clinical Reasoning: Client Action (Goal) One Interventions: Pt will improve safety and independence in getting on and off of the commode. Did Client Try?: Yes Targeted Problem Area Status: A Lot Better  Clinician View Of Client Situation:: Pt ready for a better commode seat and eager to install. OT installing and educating on how to use, remove and replace if ever needed. Pt with good form and improved safety with this commode seat as there are no legs to trip her as there were on her other seat.  Client View Of His/Her Situation:: Pt reports she likes the new seat and feels secure with use. Pt trialing and determining the best ways to get up and down from the seat. Pt then requesting OT assist with moving old seat out and putting in laundry room out of the way.  Next Visit Plan:: Provide adaptive equipment for meal prep and trial use   Ezra Sites, OTR/L  [phone] 05/08/2021

## 2021-05-09 DIAGNOSIS — E1142 Type 2 diabetes mellitus with diabetic polyneuropathy: Secondary | ICD-10-CM | POA: Diagnosis not present

## 2021-05-09 DIAGNOSIS — I5033 Acute on chronic diastolic (congestive) heart failure: Secondary | ICD-10-CM

## 2021-05-09 DIAGNOSIS — Z794 Long term (current) use of insulin: Secondary | ICD-10-CM | POA: Diagnosis not present

## 2021-05-11 ENCOUNTER — Telehealth: Payer: Medicare HMO

## 2021-05-11 NOTE — Progress Notes (Unsigned)
Chronic Care Management Pharmacy Note  05/11/2021 Name:  Rebecca [name] MRN:  [id number] DOB:  1932-07-07  Summary: -Patient reports that she is doing wel, BG in AM typically averaging 90-100 - no issues with lows - uses novolog for BG over 150, BG in PM after eating can average from 120-180's depending on what she has been eating / how much she has been eating  -Patient recently has bene able to start using her BP cuff, has not recorded BP since being able to start checking at home  -No longer needs to use norco for pain, using APAP as needed for pain control  Recommendations/Changes made from today's visit: -Recommending for patient to continue recording blood sugars twice daily - in future if desired could try for CGM, patient to start checking BP at least once weekly   Plan: -Follow up in 3 months   Subjective: Rebecca [name] is an 88 y.o. year old female who is a primary patient of Burns, [name], MD.  The CCM team was consulted for assistance with disease management and care coordination needs.    Engaged with patient by telephone for follow up visit in response to provider referral for pharmacy case management and/or care coordination services.   Consent to Services:  The patient was given the following information about Chronic Care Management services today, agreed to services, and gave verbal consent: 1. CCM service includes personalized support from designated clinical staff supervised by the primary care provider, including individualized plan of care and coordination with other care providers 2. 24/7 contact phone numbers for assistance for urgent and routine care needs. 3. Service will only be billed when office clinical staff spend 20 minutes or more in a month to coordinate care. 4. Only one practitioner may furnish and bill the service in a calendar month. 5.The patient may stop CCM services at any time (effective at the end of the month) by phone call to the  office staff. 6. The patient will be responsible for cost sharing (co-pay) of up to 20% of the service fee (after annual deductible is met). Patient agreed to services and consent obtained.  Patient Care Team: Binnie Rail, MD as PCP - General (Internal Medicine) Knox Royalty, RN as [place], RN as [place] Management  Recent office visits: None since last visit   Recent consult visits: 03/03/2021 - Dr. Dwyane Dee  - Endocrinology - no changes to medications  - no follow up needed - to has thyroid levels check by PCP periodically  02/17/2021 - Dr. Elisha Ponder - Podiatry - painful right great toe - cellulitis and abscess  - rx'd cephalexin and mupirocin ointment   Hospital visits: None in previous 6 months  Objective:  Lab Results  Component Value Date   CREATININE 1.22 (H) 01/13/2021   BUN 34 (H) 01/13/2021   GFR 69.18 01/18/2020   GFRNONAA >60 02/08/2020   GFRAA 45 (L) 11/30/2019   NA 142 01/13/2021   K 5.0 01/13/2021   CALCIUM 9.2 01/13/2021   CO2 27 01/13/2021   GLUCOSE 136 (H) 01/13/2021    Lab Results  Component Value Date/Time   HGBA1C 5.3 01/13/2021 04:36 PM   HGBA1C 6.5 (H) 01/03/2020 05:41 AM   FRUCTOSAMINE 278 09/01/2009 08:01 PM   GFR 69.18 01/18/2020 03:11 PM   GFR 51.67 (L) 06/01/2019 10:47 AM   MICROALBUR 1.9 10/24/2015 11:37 AM   MICROALBUR 2.24 (H) 04/20/2013 10:03 AM  Last diabetic Eye exam:  Lab Results  Component Value Date/Time   HMDIABEYEEXA Retinopathy (A) 08/24/2019 12:00 AM   HMDIABEYEEXA Retinopathy (A) 08/24/2019 12:00 AM    Last diabetic Foot exam:  Lab Results  Component Value Date/Time   HMDIABFOOTEX done 11/11/2018 12:00 AM     Lab Results  Component Value Date   CHOL 197 01/13/2021   HDL 85 01/13/2021   LDLCALC 94 01/13/2021   TRIG 86 01/13/2021   CHOLHDL 2.3 01/13/2021    Hepatic Function Latest Ref Rng & Units 01/13/2021 07/27/2020 02/06/2020  Total Protein  6.1 - 8.1 g/dL 6.2 - 4.4(L)  Albumin 3.5 - 5.0 g/dL - - 1.4(L)  AST 10 - 35 U/L 16 - 23  ALT 6 - 29 U/L _0 Alk Phosphatase 38 - 126 U/L - - 110  Total Bilirubin 0.2 - 1.2 mg/dL 0.3 - 0.4  Bilirubin, Direct <=0.2 mg/dL - - -    Lab Results  Component Value Date/Time   TSH 0.74 01/13/2021 04:36 PM   TSH 0.97 08/29/2020 04:32 PM   FREET4 1.3 01/13/2021 04:36 PM   FREET4 1.07 08/29/2020 04:32 PM    CBC Latest Ref Rng & Units 01/13/2021 07/27/2020 02/10/2020  WBC 3.8 - 10.8 Thousand/uL 5.8 7.8 10.0  Hemoglobin 11.7 - 15.5 g/dL 10.9(L) 10.3(L) 9.1(L)  Hematocrit 35.0 - 45.0 % 33.7(L) 31.4(L) 27.5(L)  Platelets 140 - 400 Thousand/uL 220 266.0 375    Lab Results  Component Value Date/Time   VD25OH 19 (L) 02/07/2009 07:38 PM   VD25OH 24 (L) 08/12/2008 10:42 PM    Clinical ASCVD: No  The ASCVD Risk score (Arnett DK, et al., 2019) failed to calculate for the following reasons:   The 2019 ASCVD risk score is only valid for ages 22 to 42    Depression screen PHQ 2/9 04/21/2021 04/10/2021 02/24/2021  Decreased Interest 0 0 0  Down, Depressed, Hopeless 0 0 1  PHQ - 2 Score 0 0 1  Altered sleeping - - -  Tired, decreased energy - - -  Change in appetite - - -  Feeling bad or failure about yourself  - - -  Trouble concentrating - - -  Moving slowly or fidgety/restless - - -  Suicidal thoughts - - -  PHQ-9 Score - - -  Difficult doing work/chores - - -  Some recent data might be hidden    Social History   Tobacco Use  Smoking Status Never  Smokeless Tobacco Never   BP Readings from Last 3 Encounters:  03/03/21 138/70  01/13/21 120/60  11/16/20 118/60   Pulse Readings from Last 3 Encounters:  03/03/21 68  01/13/21 67  11/16/20 (!) 54   Wt Readings from Last 3 Encounters:  03/03/21 179 lb 3.2 oz (81.3 kg)  01/13/21 183 lb (83 kg)  11/16/20 184 lb 12.8 oz (83.8 kg)   BMI Readings from Last 3 Encounters:  03/03/21 31.74 kg/m  01/13/21 32.42 kg/m  11/16/20 32.74  kg/m    Assessment/Interventions: Review of patient past medical history, allergies, medications, health status, including review of consultants reports, laboratory and other test data, was performed as part of comprehensive evaluation and provision of chronic care management services.   SDOH:  (Social Determinants of Health) assessments and interventions performed: Yes  SDOH Screenings   Alcohol Screen: Low Risk    Last Alcohol Screening Score (AUDIT): 0  Depression (PHQ2-9): Low Risk    PHQ-2 Score: 0  Financial Resource Strain: Medium  Risk   Difficulty of Paying Living Expenses: Somewhat hard  Food Insecurity: Food Insecurity Present   Worried About Charity fundraiser in the Last Year: Sometimes true   Ran Out of Food in the Last Year: Never true  Housing: Low Risk    Last Housing Risk Score: 0  Physical Activity: Insufficiently Active   Days of Exercise per Week: 7 days   Minutes of Exercise per Session: 20 min  Social Connections: Moderately Isolated   Frequency of Communication with Friends and Family: More than three times a week   Frequency of Social Gatherings with Friends and Family: Once a week   Attends Religious Services: 1 to 4 times per year   Active Member of [employer] or Organizations: No   Attends Archivist Meetings: Never   Marital Status: Widowed  Stress: No Stress Concern Present   Feeling of Stress : Not at all  Tobacco Use: Low Risk    Smoking Tobacco Use: Never   Smokeless Tobacco Use: Never   Passive Exposure: Not on file  Transportation Needs: Unmet Transportation Needs   Lack of Transportation (Medical): Yes   Lack of Transportation (Non-Medical): Yes    CCM Care Plan  Allergies  Allergen Reactions   Codeine     "Makes me drunk"    Medications Reviewed Today     Reviewed by Knox Royalty, RN (Registered Nurse) on 04/21/21 at 1317  Med List Status: <None>   Medication Order Taking? Sig Documenting Provider Last Dose Status  Informant  acetaminophen (TYLENOL) 500 MG tablet [id number] No Take 1,000 mg by mouth daily as needed for moderate pain. [provider] Taking Active Nursing Home Medication Administration Guide (MAG)  apixaban (ELIQUIS) 5 MG TABS tablet [id number] No Take 1 tablet (5 mg total) by mouth 2 (two) times daily. Binnie Rail, MD Taking Active   Ascorbic Acid (VITAMIN C) 500 MG tablet [id number] No Take 500 mg by mouth daily. [provider] Taking Active Nursing Home Medication Administration Guide (MAG)  atorvastatin (LIPITOR) 10 MG tablet [id number] No TAKE 1 TABLET BY MOUTH ONCE DAILY AT Robinette Haines, MD Taking Active   blood glucose meter kit and supplies KIT [id number] No Dispense based on patient and insurance preference. Use up to four times daily as directed. (FOR E11.9). Binnie Rail, MD Taking Active   brimonidine [facility]) 0.2 % ophthalmic solution [id number] No Place 1 drop into the right eye 2 (two) times daily. [provider] Taking Active Nursing Home Medication Administration Guide (MAG)           Med Note Griselda Miner Feb 01, 2020  3:01 PM)    calcium carbonate (OS-CAL) 600 MG TABS [id number] No Take 600 mg by mouth daily. [provider] Taking Active Nursing Home Medication Administration Guide (MAG)  Cod Liver Oil 1000 MG CAPS [id number] No Take 1,000 mg by mouth daily.  [provider] Taking Active Nursing Home Medication Administration Guide (MAG)  Coenzyme Q10 (CO Q-10) 200 MG CAPS [id number] No Take 2 each by mouth daily in the afternoon. [provider] Taking Active   DORZOLAMIDE HCL-TIMOLOL MAL OP [id number] No Place 1 drop into the right eye 2 (two) times daily. [provider] Taking Active Nursing Home Medication Administration Guide (MAG)           Med Note Griselda Miner Feb 01, 2020  3:02 PM)    Dulaglutide (TRULICITY)  1.5 MG/0.5ML SOPN [id number] No Inject 1.5 mg into the skin once a week.  Binnie Rail, MD Taking Active   gabapentin (NEURONTIN) 300 MG capsule [id number] No Take 1 capsule (300 mg total) by mouth daily. Binnie Rail, MD Taking Active   Glucerna [facility]) [place] [id number] No Take 237 mLs by mouth daily.  Patient not taking: Reported on 04/10/2021   [provider] Not Taking Active Nursing Home Medication Administration Guide (MAG)  glucose blood (ACCU-CHEK GUIDE) test strip [id number]  USE 1 STRIP TO CHECK GLUCOSE UP TO FOUR TIMES DAILY Burns, [name], MD  Active   hydrochlorothiazide (HYDRODIURIL) 12.5 MG tablet [id number] No Take 12.5 mg by mouth daily as needed (swelling). [provider] Taking Active   HYDROcodone-acetaminophen (NORCO/VICODIN) 5-325 MG tablet [id number] No Take 1 tablet by mouth daily as needed for moderate pain (chronic foot pain).  Patient not taking: Reported on 04/10/2021   Binnie Rail, MD Not Taking Active   insulin aspart (NOVOLOG) 100 UNIT/ML FlexPen [id number] No Patient Sig: 0-15 Units, Subcutaneous, 3 times daily with meals.  CBG < 70: Implement Hypoglycemia measures, call MD.  CBG 70 - 120: 0 units.   CBG 121 - 150: 2 units.  CBG 151 - 200: 3 units.  CBG 201 - 250: 5 units.  CBG . 251 - 300: 8 units.  CBG 301 - 350: 11 units.  CBG 351 - 400: 15 units.  CBG > 400: call MD Binnie Rail, MD Taking Active   insulin glargine (LANTUS SOLOSTAR) 100 UNIT/ML Solostar Pen [id number] No Inject 10 Units into the skin daily.  Patient taking differently: Inject 8 Units into the skin daily.   Binnie Rail, MD Taking Active   Insulin Pen Needle (BD AUTOSHIELD DUO) 30G X 5 MM MISC [id number] No USE AS DIRECTED WITH INSULIN PEN NEEDLE 4 TIMES DAILY Binnie Rail, MD Taking Active   Lancet Device MISC [id number]  One Touch ultra 2 Delica plus lancet device. Use as directed to monitor sugars.  E11.42 Binnie Rail, MD  Active   Lancets [facility] ULTRASOFT) lancets [id number]  Use as instructed Binnie Rail, MD  Active   latanoprost  (XALATAN) 0.005 % ophthalmic solution [id number] No Place 1 drop into both eyes at bedtime. [provider] Taking Active Nursing Home Medication Administration Guide (MAG)  lisinopril (ZESTRIL) 20 MG tablet [id number] No Take 1 tablet (20 mg total) by mouth daily. Binnie Rail, MD Taking Active   lisinopril (ZESTRIL) 40 MG tablet [id number] No Take 1 tablet (40 mg total) by mouth daily. Binnie Rail, MD Taking Active   Multiple Vitamin (MULTIVITAMIN) tablet [id number] No Take 1 tablet by mouth daily. Prn [provider] Taking Active Nursing Home Medication Administration Guide (MAG)  mupirocin ointment (BACTROBAN) 2 % [id number] No Apply to right great toe once daily  Patient not taking: Reported on 04/10/2021   Marzetta Board, DPM Not Taking Active   nitroGLYCERIN (NITROSTAT) 0.4 MG SL tablet [id number] No DISSOLVE ONE TABLET UNDER TONGUE AS DIRECTED FOR CHEST PAIN  Patient taking differently: Place 0.4 mg under the tongue every 5 (five) minutes as needed for chest pain.   Larey Dresser, MD Taking Active            Med Note Griselda Miner Feb 01, 2020  3:11 PM)    omeprazole (PRILOSEC) 20 MG capsule [id number] No Take 1 capsule (20 mg total) by mouth daily. Burns,  [name], MD Taking Active   Turmeric 500 MG CAPS [id number] No Take 1 capsule by mouth daily in the afternoon. [provider] Taking Active   Med List Note Maud Deed, CPhT 02/05/20 1436): [place]             Patient Active Problem List   Diagnosis Date Noted   Paroxysmal A-fib ([place]) 01/12/2020   H/O thyroid storm 01/06/2020   Spinal stenosis 01/06/2020   Chronic kidney disease, stage 3a ([place]) 01/03/2020   Microcytic anemia 01/03/2020   Acute on chronic diastolic CHF (congestive heart failure) ([place]) 01/03/2020   Physical deconditioning 01/03/2020   Generalized osteoarthritis of hand 06/01/2019   Peripheral neuropathy 06/01/2019   Degenerative arthritis of  right knee 12/15/2018   Knee osteoarthritis 09/08/2018   Pes planus 12/01/2017   Foot deformity, acquired, left 11/04/2017   Degenerative disc disease, lumbar 08/01/2016   Glaucoma 07/23/2016   Numbness 04/23/2016   Chronic pain of right knee 12/28/2015   Lower back pain 10/24/2015   Pain in both feet 10/24/2015   Abnormality of gait 04/01/2013   Posterior tibial tendon dysfunction 04/01/2013   Diabetes ([place]) 04/01/2013   Ankle pain, left 10/20/2010   UNSPECIFIED VITAMIN D DEFICIENCY 11/08/2008   EDEMA- LOCALIZED 11/08/2008   Sickle-cell trait ([place]) 01/01/2008   Esophageal reflux 09/02/2007   GOITER, MULTINODULAR 11/26/2006   Coronary artery disease involving native coronary artery of native heart without angina pectoris 11/26/2006   HIATAL HERNIA 11/26/2006   HYPERCHOLESTEROLEMIA 05/27/2006   Essential hypertension 05/27/2006    Immunization History  Administered Date(s) Administered   Fluad Quad(high Dose 65+) 11/24/2018, 01/08/2020   Influenza Split 01/22/2011, 02/20/2012   Influenza Whole 03/13/1999, 01/01/2008, 02/07/2009, 12/13/2009   Influenza, High Dose Seasonal PF 02/07/2016, 02/04/2017, 01/27/2018   Influenza,inj,Quad PF,6+ Mos 01/16/2013   Moderna Covid-19 Vaccine Bivalent Booster 61yr & up 09/10/2020   PFIZER(Purple Top)SARS-COV-2 Vaccination 04/25/2019, 05/20/2019, 12/26/2019   Pneumococcal Conjugate-13 07/14/2015   Pneumococcal Polysaccharide-23 05/08/2006    Conditions to be addressed/monitored:  Hypertension, Hyperlipidemia, Diabetes, Atrial Fibrillation, GERD, and Chronic Kidney Disease  There are no care plans that you recently modified to display for this patient.      Medication Assistance: None required.  Patient affirms current coverage meets needs.  Compliance/Adherence/Medication fill history: Care Gaps: DEXA scan, Foot Exam, Ophthalmology Exam  Patient's preferred pharmacy is:  [facility] [address]Phone: [phone]: [phone] [place] [address] [place] [address]Phone: [phone]: [phone]   Uses pill box? No - feels she is better able to manage without  Pt endorses 100% compliance  Care Plan and Follow Up Patient Decision:  Patient agrees to Care Plan and Follow-up.  Plan: Telephone follow up appointment with care management team member scheduled for:  3 months  The patient has been provided with contact information for the care management team and has been advised to call with any health related questions or concerns.   DTomasa Blase PharmD Clinical Pharmacist, [place]

## 2021-05-15 ENCOUNTER — Telehealth: Payer: Self-pay

## 2021-05-15 NOTE — Telephone Encounter (Signed)
Returned patient's phonecall. She reported the braces are not like her previous ones and that having full foot length braces results in too much material under her toes and she does not like the feel of it. Advised patient to bring braces back at her earliest convenience for modification. Patient will bring braces back on 3/14 when she has an appointment to have nail work done. All questions answered and concerns addressed. ?

## 2021-05-16 ENCOUNTER — Telehealth: Payer: Self-pay

## 2021-05-16 NOTE — Telephone Encounter (Signed)
? ?  Telephone encounter was:  Successful.  ?05/16/2021 ?Name: Rebecca [name] MRN: [id number] DOB: 1933/02/05 ? ?[name] is a 88 y.o. year old female who is a primary care patient of Burns, [name], MD . The community resource team was consulted for assistance with Transportation Needs , Food Insecurity, and Financial Difficulties related to Medical Bills ? ?Care guide performed the following interventions:  Pt called in advising mail received. She also had a few questions regarding the programs sent. We discussed [employer] transportation and their rules as well as went over information for the debt relief program. Pt son was advised of transportation as well.  Also Meals on Wheels application was done in November per coordinator Tina, pt advised she wasn't interested in food banks, therefore, none was sent out because she currently goes to one. ? ?Follow Up Plan:  No further follow up planned at this time. The patient has been provided with needed resources. ? ?Hessie Knows ?Care Guide, Embedded Care Coordination ?[facility]  Care management  ?[place], [place] [address]  ?Main Phone: [phone]  E-mail: [email or web address]  ?Website: [email or web address] ? ? ? ?

## 2021-05-17 ENCOUNTER — Other Ambulatory Visit: Payer: Self-pay

## 2021-05-19 NOTE — Patient Instructions (Signed)
Visit Information ? ?Thank you for taking time to visit with me today. Please don't hesitate to contact me if I can be of assistance to you before our next scheduled telephone appointment. ? ?Following are the goals we discussed today:  ? Goals Addressed   ? ?  ?  ?  ?  ?  ? This Visit's Progress  ?   Patient Stated (pt-stated)     ?   Aging Gracefully RN Goals: ? ?Goal: Patient will report better understanding of how to use her lancet device in the next 120 days. ? ?04/10/2021 ?Assessment: Patient is not able to use the lancet device that goes with her Accu chek monitor.  She not able to get the cap off where the lancet is. Reports she has told the MD office and they don't understand. Patient is currently using One Touch Ultrea 2- Delica plus Lancet, She is able to manage this lancet but needs a replacement.  ?Interventions:  Demonstrated many times with patient how to remove cap for lanet device and she is not able to do this activity due to her hand strength.  ?PLAN: in basket message sent to MD to order CCM services. Will see patient again in 1 month.  ?Tomasa Rand RN, BSN, CEN ?RN Case Manager for Aging Gracefully ?[place] ?Mobile: [phone]  ? ?05/17/2021 ?Assessment: Patient reports that she has her new lancet device and it working well.  CBG today 112. ? ?Interventions: reviewed with patient to call MD for any problems with lancet device. ? ?PLAN: Follow up home visit on 07/12/2021  at 1pm ?  ?   Patient Stated     ?   Aging Gracefully RN Goal: ? ?Goal: Patient will report talking with social worker and getting help with transportation problems in the next 60 days. ? ?04/10/2021 ?Assessment:  Patient reports that she does not drive and that her son is a Administrator and has limited availability to take her to the store and MD appointment. Past due for an eye exam. Needs a foot recheck and needs to be able to get groceries. ?Interventions: will inquire if MD will order CCM services to  assist client. ?Plan: Follow up in 1 month.  ? ?Tomasa Rand RN, BSN, CEN ?RN Case Manager for Aging Gracefully ?[place] ?Mobile: [phone]   ? ?05/17/2021 ?Assessment Patient reports that she spoke with someone about transportation and she knows to call the company days in advance to get a ride. ? ?Intervention:  encouraged patient to follow instructions provided by Education officer, museum ?PLAN: follow up home visit planned for 07/12/2021 at 1pm ? ?Tomasa Rand RN, BSN, CEN ?RN Case Manager for Aging Gracefully ?[place] ?Mobile: [phone]  ?  ?   Patient Stated     ?   Aging Gracefully RN ? ?Goal: Patient will report no falls in the next 120 days. ? ?04/10/2021 ?Assessment: Patient uses a rolling walker.  She has throw rugs all over her home, which she reports is no problem.  Noted that patient leans over her walker due to back pain. Patient has an upright walker that she occasionally uses. ?Interventions: Reviewed fall precaustions ?Plan: will follow up in 1 month ? ?Tomasa Rand RN, BSN, CEN ?RN Case Manager for Aging Gracefully ?[place] ?Mobile: [phone]  ? ?05/17/2021 ?Assessment: no falls since last home visit. Noted that feet turn out and patient has problems with ambulation. Using a Rolator. ? ?Interventions: provided home exercise plan in  written form. Reviewed and demonstrated. Client able to return demonstrate. Encourage patient to do home exercises daily. ? ?Plan: follow up home visit planned for 07/12/2021  at 1pm ?  ? ?  ?  ? ?Our next appointment is  home visit  on 07/12/2021 at 1pm ? ?Please call the care guide team at [phone] if you need to cancel or reschedule your appointment.  ? ?If you are experiencing a Mental Health or [place] or need someone to talk to, please call the Suicide and Crisis Lifeline: 988 ?call the [place] National Suicide Prevention Lifeline: (316)300-3562 or TTY: 301-537-6778 TTY (754)802-0901) to talk to a  trained counselor ?call 1-800-273-TALK (toll free, 24 hour hotline) ?call 911  ? ?The patient verbalized understanding of instructions, educational materials, and care plan provided today and agreed to receive a mailed copy of patient instructions, educational materials, and care plan.  ? ?Tomasa Rand RN, BSN, CEN ?RN Case Manager for Aging Gracefully ?[place] ?Mobile: [phone]  ?

## 2021-05-19 NOTE — Patient Outreach (Signed)
Aging Gracefully Program ? ?RN Visit ? ?05/19/2021 ? ?Rebecca [name] ?April 01, 1932 ?[id number] ? ?Visit:   RN home visit #2 ? ?Start Time:   1400 ?End Time:   1445 ?Total Minutes:    ? ?Readiness To Change Score:    ? ?Universal RN Interventions: ?Calendar Distribution: Yes ?Exercise Review: Yes ?Medications: Yes ?Medication Changes: Yes ?Mood: Yes ?Pain: Yes ?PCP Advocacy/Support: Yes ?Fall Prevention: Yes ?Incontinence: Yes ?Clinician View Of Client Situation: Patient answered door slowly. ambulating with a rollator.  No caregiver. Well dressed. Noted feet turning out. ?Client View Of His/Her Situation: Patient reports that her caregiver did not show up. Reports she call agency and was told she needed a " reassessment"  Patient is unsure what this means. She called her Medicaid case worker named Jeri Modena at [phone] . She left a voicemail.  I called Ms, Jerrye Noble today during home visit and left a mesage.  Patient reports that she is going to struggle with no assistance in her home.   Patient reports to me that she got her new lancet device and it is working well. CBG today of 112. Reports she heard from a Child psychotherapist and got transportation services.  Reports she needs to complete some forms but does not know how to do this.  Appears happy that she has gotten some help from [employer]. ? ?Healthcare Provider Communication: ?Did Surveyor, mining With [employer] Provider?: No ?According to Client, Did PCP Report Communication With An Aging Gracefully RN?: No ? ?Clinician View of Client Situation: ?Clinician View Of Client Situation: Patient answered door slowly. ambulating with a rollator.  No caregiver. Well dressed. Noted feet turning out. ?Client's View of His/Her Situation: ?Client View Of His/Her Situation: Patient reports that her caregiver did not show up. Reports she call agency and was told she needed a " reassessment"  Patient is unsure what this means. She called her Medicaid case  worker named Jeri Modena at [phone] . She left a voicemail.  I called Ms, Jerrye Noble today during home visit and left a mesage.  Patient reports that she is going to struggle with no assistance in her home.   Patient reports to me that she got her new lancet device and it is working well. CBG today of 112. Reports she heard from a Child psychotherapist and got transportation services.  Reports she needs to complete some forms but does not know how to do this.  Appears happy that she has gotten some help from [employer]. ? ?Medication Assessment: denies any changes to medications ?  ? ?OT Update: pending community housing assessment ? ?Session Summary: Client with no PCS services any longer and no one to assist with home needs.  According to Jeri Modena no longer has long term care medicaid.   Collaborated with Marja Kays ( managed medicaid and embedded assistant director) to help coordinate next steps to help patient. Informs patient's case manager Ricke Hey as well.  Marja Kays will plan next steps to help patient. ? ? Goals Addressed   ? ?  ?  ?  ?  ?  ? This Visit's Progress  ?   Patient Stated (pt-stated)     ?   Aging Gracefully RN Goals: ? ?Goal: Patient will report better understanding of how to use her lancet device in the next 120 days. ? ?04/10/2021 ?Assessment: Patient is not able to use the lancet device that goes with her Accu chek monitor.  She not able to  get the cap off where the lancet is. Reports she has told the MD office and they don't understand. Patient is currently using One Touch Ultrea 2- Delica plus Lancet, She is able to manage this lancet but needs a replacement.  ?Interventions:  Demonstrated many times with patient how to remove cap for lanet device and she is not able to do this activity due to her hand strength.  ?PLAN: in basket message sent to MD to order CCM services. Will see patient again in 1 month.  ?Rowe Pavy RN, BSN, CEN ?RN Case Manager for Aging  Gracefully ?[facility] [employer] [facility] ?Mobile: [phone]  ? ?05/17/2021 ?Assessment: Patient reports that she has her new lancet device and it working well.  CBG today 112. ? ?Interventions: reviewed with patient to call MD for any problems with lancet device. ? ?PLAN: Follow up home visit on 07/12/2021  at 1pm ?  ?   Patient Stated     ?   Aging Gracefully RN Goal: ? ?Goal: Patient will report talking with social worker and getting help with transportation problems in the next 60 days. ? ?04/10/2021 ?Assessment:  Patient reports that she does not drive and that her son is a Naval architect and has limited availability to take her to the store and MD appointment. Past due for an eye exam. Needs a foot recheck and needs to be able to get groceries. ?Interventions: will inquire if MD will order CCM services to assist client. ?Plan: Follow up in 1 month.  ? ?Rowe Pavy RN, BSN, CEN ?RN Case Manager for Aging Gracefully ?[facility] [employer] [facility] ?Mobile: [phone]   ? ?05/17/2021 ?Assessment Patient reports that she spoke with someone about transportation and she knows to call the company days in advance to get a ride. ? ?Intervention:  encouraged patient to follow instructions provided by Child psychotherapist ?PLAN: follow up home visit planned for 07/12/2021 at 1pm ? ?Rowe Pavy RN, BSN, CEN ?RN Case Manager for Aging Gracefully ?[facility] [employer] [facility] ?Mobile: [phone]  ?  ?   Patient Stated     ?   Aging Gracefully RN ? ?Goal: Patient will report no falls in the next 120 days. ? ?04/10/2021 ?Assessment: Patient uses a rolling walker.  She has throw rugs all over her home, which she reports is no problem.  Noted that patient leans over her walker due to back pain. Patient has an upright walker that she occasionally uses. ?Interventions: Reviewed fall precaustions ?Plan: will follow up in 1 month ? ?Rowe Pavy RN, BSN, CEN ?RN Case Manager for Aging Gracefully ?[facility] ?Mobile: [phone]   ? ?05/17/2021 ?Assessment: no falls since last home visit. Noted that feet turn out and patient has problems with ambulation. Using a Rolator. ? ?Interventions: provided home exercise plan in written form. Reviewed and demonstrated. Client able to return demonstrate. Encourage patient to do home exercises daily. ? ?Plan: follow up home visit planned for 07/12/2021  at 1pm ?  ? ?  ?  ?Rowe Pavy RN, BSN, CEN ?RN Case Manager for Aging Gracefully ?[facility] ?Mobile: [phone]  ? ?

## 2021-05-23 ENCOUNTER — Ambulatory Visit: Payer: Medicare HMO | Admitting: Podiatry

## 2021-05-23 ENCOUNTER — Encounter: Payer: Self-pay | Admitting: Podiatry

## 2021-05-23 ENCOUNTER — Other Ambulatory Visit: Payer: Self-pay

## 2021-05-23 ENCOUNTER — Ambulatory Visit (INDEPENDENT_AMBULATORY_CARE_PROVIDER_SITE_OTHER): Payer: Medicare HMO

## 2021-05-23 DIAGNOSIS — E1140 Type 2 diabetes mellitus with diabetic neuropathy, unspecified: Secondary | ICD-10-CM

## 2021-05-23 DIAGNOSIS — M2141 Flat foot [pes planus] (acquired), right foot: Secondary | ICD-10-CM | POA: Diagnosis not present

## 2021-05-23 DIAGNOSIS — M2142 Flat foot [pes planus] (acquired), left foot: Secondary | ICD-10-CM | POA: Diagnosis not present

## 2021-05-23 DIAGNOSIS — E1142 Type 2 diabetes mellitus with diabetic polyneuropathy: Secondary | ICD-10-CM

## 2021-05-23 DIAGNOSIS — I509 Heart failure, unspecified: Secondary | ICD-10-CM | POA: Diagnosis not present

## 2021-05-23 DIAGNOSIS — B351 Tinea unguium: Secondary | ICD-10-CM | POA: Diagnosis not present

## 2021-05-23 DIAGNOSIS — D573 Sickle-cell trait: Secondary | ICD-10-CM | POA: Diagnosis not present

## 2021-05-23 NOTE — Progress Notes (Addendum)
SITUATION ?Reason for Consult: Follow-up with AFO ?Patient / Caregiver Report: Patient does not like that it goes full foot and would like it to be 3/4 length like her last one ? ?OBJECTIVE DATA ?History / Diagnosis:  ?  ICD-10-CM   ?1. Diabetic peripheral neuropathy associated with type 2 diabetes mellitus (HCC)  E11.42   ?  ?2. Pes planus of right foot  M21.41   ?  ?3. Acquired pes planovalgus of left foot  M21.42   ?  ? ? ?Change in Pathology: None ? ?ACTIONS PERFORMED ?Patient's equipment was checked for structural stability and fit. Brace returned to manufacturer for modification.  All questions answered and concerns addressed. ? ?PLAN ?Patient to be contacted when brace ready. Plan of care discussed with and agreed upon by patient / caregiver. ? ?

## 2021-05-23 NOTE — Progress Notes (Signed)
SITUATION ?Reason for Visit: Fitting of Diabetic Shoes & Insoles ?Patient / Caregiver Report:  Patient received her diabetic shoes last visit and is here for inserts today ?OBJECTIVE DATA: ?Patient History / Diagnosis:   ?  ICD-10-CM   ?1. Diabetic peripheral neuropathy associated with type 2 diabetes mellitus (HCC)  E11.42   ?  ?2. Pes planus of right foot  M21.41   ?  ?3. Acquired pes planovalgus of left foot  M21.42   ?  ? ? ?Change in Status:   None ? ?ACTIONS PERFORMED: ?In-Person Delivery, patient was fit with: ?- 3x pair [phone] PDAC approved vacuum formed custom diabetic insoles; [employer]: [id number] ? ?Shoes and insoles were verified for structural integrity and safety. Patient wore shoes and insoles in office. Skin was inspected and free of areas of concern after wearing shoes and inserts. Shoes and inserts fit properly. Patient / Caregiver provided with ferbal instruction and demonstration regarding donning, doffing, wear, care, proper fit, function, purpose, cleaning, and use of shoes and insoles ' and in all related precautions and risks and benefits regarding shoes and insoles. Patient / Caregiver was instructed to wear properly fitting socks with shoes at all times. Patient was also provided with verbal instruction regarding how to report any failures or malfunctions of shoes or inserts, and necessary follow up care. Patient / Caregiver was also instructed to contact physician regarding change in status that may affect function of shoes and inserts.  ? ?Patient / Caregiver verbalized undersatnding of instruction provided. Patient / Caregiver demonstrated independence with proper donning and doffing of shoes and inserts. ? ?PLAN ?Patient to follow up as needed. Plan of care was discussed with and agreed upon by patient and/or caregiver. All questions were answered and concerns addressed.  ?

## 2021-05-24 ENCOUNTER — Encounter: Payer: Self-pay | Admitting: Licensed Clinical Social Worker

## 2021-05-24 ENCOUNTER — Ambulatory Visit (INDEPENDENT_AMBULATORY_CARE_PROVIDER_SITE_OTHER): Payer: Medicare HMO | Admitting: Licensed Clinical Social Worker

## 2021-05-24 DIAGNOSIS — Z7689 Persons encountering health services in other specified circumstances: Secondary | ICD-10-CM

## 2021-05-24 DIAGNOSIS — I10 Essential (primary) hypertension: Secondary | ICD-10-CM

## 2021-05-24 DIAGNOSIS — N1831 Chronic kidney disease, stage 3a: Secondary | ICD-10-CM

## 2021-05-24 NOTE — Chronic Care Management (AMB) (Addendum)
?Chronic Care Management  ? Clinical Social Work Note ? ?05/24/2021 ?Name: Rebecca [name] MRN: [id number] DOB: [birth year] ? ?Rebecca [name] is a 88 y.o. year old female who is a primary care patient of Burns, [name], MD. The CCM team was consulted to assist the patient with chronic disease management and/or care coordination needs related to: Level of Care Concerns.  ? ?Engaged with patient by telephone for initial visit in response to provider referral for social work chronic care management and care coordination services.  ? ?Consent to Services:  ?The patient was given the following information about Chronic Care Management services today, agreed to services, and gave verbal consent: 1. CCM service includes personalized support from designated clinical staff supervised by the primary care provider, including individualized plan of care and coordination with other care providers 2. 24/7 contact phone numbers for assistance for urgent and routine care needs. 3. Service will only be billed when office clinical staff spend 20 minutes or more in a month to coordinate care. 4. Only one practitioner may furnish and bill the service in a calendar month. 5.The patient may stop CCM services at any time (effective at the end of the month) by phone call to the office staff. 6. The patient will be responsible for cost sharing (co-pay) of up to 20% of the service fee (after annual deductible is met). Patient agreed to services and consent obtained. ? ?Patient agreed to services and consent obtained.  ? ?Summary: Assessed patient's previous and current treatment, coping skills, support system and barriers to care..  She is experiencing barriers with meeting ADL's . After assessing all options there are no quick fixes to meet patient's needs as she is unable to private pay for an aide, does not qualify for full Medicaid which would provide an aide, currently has MQB Medicaid. See Care Plan below for interventions and  patient self-care actives. ?Additional concerns: difficulty with paying medical bills from nursing facility, EMS etc. ( Care guide mailed information for [facility] Financial Assistance,) patient reports difficulty with completing form. Reviewed with patient what she needs for the application, Patient requested time to gather the information. LCSW will address during next encounter. ? ?Recommendation: Patient may benefit from, and is in agreement for LCSW to make referral for CAPS program and In-Home Aide program. She understands both programs have wait list and this will be a long process.  ? ?Follow up Plan: Patient would like continued follow-up from CCM LCSW.  per patient's request will follow up in 30 days.  Will call office if needed prior to next encounter. ?  ?Assessment: Review of patient past medical history, allergies, medications, and health status, including review of relevant consultants reports was performed today as part of a comprehensive evaluation and provision of chronic care management and care coordination services.    ? ?SDOH (Social Determinants of Health) assessments and interventions performed:   ? ?Advanced Directives Status: Not addressed in this encounter. ? ?CCM Care Plan ?Conditions to be addressed/monitored: HTN and DMII; Limited social support and Level of care concerns ? ?Care Plan : LCSW Care Plan  ?Updates made by Maurine Cane, LCSW since 05/24/2021 12:00 AM  ?  ? ?Problem: Mobility and Independence with ADL's   ?  ? ?Long-Range Goal: Mobility and Independence Optimized with additional home support   ?Start Date: 05/24/2021  ?This Visit's Progress: On track  ?Priority: High  ?Note:   ?Current Barriers:  ?Financial constraints related to fixed income, medical bills and  Limited social support  ?Level of Care Concerns:Inability to perform ADL's independently ?Lacks knowledge of how to connect  ? ?CSW Clinical Goal(s):  ?Patient  will  work with CAPS program and In-Home Aide program   through collaboration with [facility] representative, provider, and care team.  ? ?Interventions: ?1:1 collaboration with primary care provider regarding development and update of comprehensive plan of care as evidenced by provider attestation and co-signature ?Inter-disciplinary care team collaboration (see longitudinal plan of care) ?Evaluation of current treatment plan related to  self management and patient's adherence to plan as established by provider ?Review resources, discussed options and provided patient information about  ?Department of Social Services ( does not qualify for Medicaid ) ?Transportation provided by insurance provider (Will call insurance provider 3 days prior to appointments) ?Referral to care guide (Has been placed by CCM RN)  ?PACE Program (not interested but willing to review information ) ?Community Alternative Program  (CAP) ?Private pay options for personal care needs :(unable to pay at this time) ?[facility] in-home aide program:(called to place patient on wait list [phone]) ?Meals on wheels (currently on wait list) ? ?Level of Care Concerns in a patient with HTN and DMII:  (Status: New goal.) ?Current level of care: home, alone ?Evaluation of patient safety in current living environment ?Solution-Focused Strategies employed:  ?Active listening / Reflection utilized  ?Problem Solving /Task Center strategies reviewed ?Made referral to CAPS program and In-Home Aide program,( both have wait list) ?Discussed PACE program ? ?Task & activities to accomplish goals: ?Remember to call about  transportation to your appointment 1 week before   ?I have placed a referral for the CAPS program to assist with getting you an aide, remember this is a long process. You will receive a packet in the mail, once form for you to complete and the other will be for Dr. Quay Burow to complete. ?I have also called [place] program to put you on the wait list.  They will call you to  gather more information about your needs.  You may call them if no one has called you in a week  [phone]  ?Please review the information on the PACE program for possible assistance with and aide ?  ?  ? ?Casimer Lanius, LCSW ?Licensed Clinical Social Worker [facility]  ?[place]  ?[phone]  ? ? ?

## 2021-05-24 NOTE — Patient Instructions (Signed)
Visit Information  ? ?Thank you for taking time to visit with me today. Please don't hesitate to contact me if I can be of assistance to you before our next scheduled telephone appointment. ? ?Following are the goals we discussed today:  Options for an Aide ? ?Our next appointment is by telephone on April 17th at 9:00 ? ?Casimer Lanius, LCSW ?Licensed Clinical Social Worker [facility]  ?[place]  ? ?Please call the care guide team at [phone] if you need to cancel or reschedule your appointment.  ? ?If you are experiencing a Mental Health or [place] or need someone to talk to, please call 1-800-273-TALK (toll free, 24 hour hotline)  ? ?Following is a copy of your full care plan:  ?Care Plan : [place]  ?Updates made by Maurine Cane, LCSW since 05/24/2021 12:00 AM  ?  ? ?Problem: Mobility and Independence with ADL's   ?  ? ?Long-Range Goal: Mobility and Independence Optimized with additional home support   ?Start Date: 05/24/2021  ?This Visit's Progress: On track  ?Priority: High  ?Note:   ?Current Barriers:  ?Financial constraints related to fixed income, medical bills and Limited social support  ?Level of Care Concerns:Inability to perform ADL's independently ?Lacks knowledge of how to connect  ? ?CSW Clinical Goal(s):  ?Patient  will  work with CAPS program and In-Home Aide program  through collaboration with [facility] representative, provider, and care team.  ? ?Interventions: ?1:1 collaboration with primary care provider regarding development and update of comprehensive plan of care as evidenced by provider attestation and co-signature ?Inter-disciplinary care team collaboration (see longitudinal plan of care) ?Evaluation of current treatment plan related to  self management and patient's adherence to plan as established by provider ?Review resources, discussed options and provided patient information about  ?Department of Social Services ( does not  qualify for Medicaid ) ?Transportation provided by insurance provider (Will call insurance provider 3 days prior to appointments) ?Referral to care guide (Has been placed by CCM RN)  ?PACE Program (not interested but willing to review information ) ?Community Alternative Program  (CAP) ?Private pay options for personal care needs :(unable to pay at this time) ?[facility] in-home aide program:(called to place patient on wait list [phone]) ?Meals on wheels (currently on wait list) ? ?Level of Care Concerns in a patient with HTN and DMII:  (Status: New goal.) ?Current level of care: home, alone ?Evaluation of patient safety in current living environment ?Solution-Focused Strategies employed:  ?Active listening / Reflection utilized  ?Problem Solving /Task Center strategies reviewed ?Made referral to CAPS program and In-Home Aide program,( both have wait list) ?Discussed PACE program ? ?Task & activities to accomplish goals: ?Remember to call about  transportation to your appointment 1 week before   ?I have placed a referral for the CAPS program to assist with getting you an aide, remember this is a long process. You will receive a packet in the mail, once form for you to complete and the other will be for Dr. Quay Burow to complete. ?I have also called [facility] program to put you on the wait list.  They will call you to gather more information about your needs.  You may call them if no one has called you in a week  [phone]  ?Please review the information on the PACE program for possible assistance with and aide ?  ? ? ?Consent to CCM Services: ?Ms. Fiala was  given information about Chronic Care Management services including:  ?CCM service includes personalized support from designated clinical staff supervised by her physician, including individualized plan of care and coordination with other care providers ?24/7 contact phone numbers for assistance for urgent and routine care  needs. ?Service will only be billed when office clinical staff spend 20 minutes or more in a month to coordinate care. ?Only one practitioner may furnish and bill the service in a calendar month. ?The patient may stop CCM services at any time (effective at the end of the month) by phone call to the office staff. ?The patient will be responsible for cost sharing (co-pay) of up to 20% of the service fee (after annual deductible is met). ? ?Patient agreed to services and verbal consent obtained.  ? ?The patient verbalized understanding of instructions, educational materials, and care plan provided today and agreed to receive a mailed copy of patient instructions, educational materials, and care plan.  ? ? ? ? ?  ?

## 2021-05-26 ENCOUNTER — Telehealth: Payer: Self-pay | Admitting: Internal Medicine

## 2021-05-26 ENCOUNTER — Ambulatory Visit: Payer: Medicare HMO | Admitting: *Deleted

## 2021-05-26 ENCOUNTER — Other Ambulatory Visit: Payer: Self-pay

## 2021-05-26 DIAGNOSIS — E1122 Type 2 diabetes mellitus with diabetic chronic kidney disease: Secondary | ICD-10-CM

## 2021-05-26 DIAGNOSIS — I5032 Chronic diastolic (congestive) heart failure: Secondary | ICD-10-CM

## 2021-05-26 MED ORDER — INSULIN ASPART 100 UNIT/ML FLEXPEN: PEN_INJECTOR | SUBCUTANEOUS | 11 refills | Status: DC

## 2021-05-26 NOTE — Chronic Care Management (AMB) (Signed)
?Chronic Care Management  ? ?CCM RN Visit Note ? ?05/26/2021 ?Name: Rebecca [name] MRN: [id number] DOB: 29-Jan-1933 ? ?Subjective: ?Rebecca [name] is a 88 y.o. year old female who is a primary care patient of Burns, [name], MD. The care management team was consulted for assistance with disease management and care coordination needs.   ? ?Engaged with patient by telephone for follow up visit in response to provider referral for case management and/or care coordination services.  ? ?Consent to Services:  ?The patient was given information about Chronic Care Management services, agreed to services, and gave verbal consent prior to initiation of services.  Please see initial visit note for detailed documentation.  ?Patient agreed to services and verbal consent obtained.  ? ?Assessment: Review of patient past medical history, allergies, medications, health status, including review of consultants reports, laboratory and other test data, was performed as part of comprehensive evaluation and provision of chronic care management services.  ?CCM Care Plan ? ?Allergies  ?Allergen Reactions  ? Codeine   ?  "Makes me drunk"  ? ?Outpatient Encounter Medications as of 05/26/2021  ?Medication Sig  ? acetaminophen (TYLENOL) 500 MG tablet Take 1,000 mg by mouth daily as needed for moderate pain.  ? Ascorbic Acid (VITAMIN C) 500 MG tablet Take 500 mg by mouth daily.  ? atorvastatin (LIPITOR) 10 MG tablet TAKE 1 TABLET BY MOUTH ONCE DAILY AT 6PM  ? blood glucose meter kit and supplies KIT Dispense based on patient and insurance preference. Use up to four times daily as directed. (FOR E11.9).  ? brimonidine (ALPHAGAN) 0.2 % ophthalmic solution Place 1 drop into the right eye 2 (two) times daily.  ? calcium carbonate (OS-CAL) 600 MG TABS Take 600 mg by mouth daily.  ? Cod Liver Oil 1000 MG CAPS Take 1,000 mg by mouth daily.   ? Coenzyme Q10 (CO Q-10) 200 MG CAPS Take 2 each by mouth daily in the afternoon.  ? DORZOLAMIDE  HCL-TIMOLOL MAL OP Place 1 drop into the right eye 2 (two) times daily.  ? Dulaglutide (TRULICITY) 1.5 IR/4.4RX SOPN Inject 1.5 mg into the skin once a week.  ? ELIQUIS 5 MG TABS tablet TAKE 1 TABLET TWICE DAILY (APPOINTMENT IS NEEDED FOR MORE REFILLS)  ? gabapentin (NEURONTIN) 300 MG capsule Take 1 capsule (300 mg total) by mouth daily.  ? Glucerna (GLUCERNA) LIQD Take 237 mLs by mouth daily. (Patient not taking: Reported on 04/10/2021)  ? glucose blood (ACCU-CHEK GUIDE) test strip USE 1 STRIP TO CHECK GLUCOSE UP TO FOUR TIMES DAILY  ? hydrochlorothiazide (HYDRODIURIL) 12.5 MG tablet Take 12.5 mg by mouth daily as needed (swelling).  ? HYDROcodone-acetaminophen (NORCO/VICODIN) 5-325 MG tablet Take 1 tablet by mouth daily as needed for moderate pain (chronic foot pain). (Patient not taking: Reported on 04/10/2021)  ? insulin aspart (NOVOLOG) 100 UNIT/ML FlexPen Patient Sig: 0-15 Units, Subcutaneous, 3 times daily with meals.  CBG < 70: Implement Hypoglycemia measures, call MD.  CBG 70 - 120: 0 units.   CBG 121 - 150: 2 units.  CBG 151 - 200: 3 units.  CBG 201 - 250: 5 units.  CBG . 251 - 300: 8 units.  CBG 301 - 350: 11 units.  CBG 351 - 400: 15 units.  CBG > 400: call MD  ? insulin glargine (LANTUS SOLOSTAR) 100 UNIT/ML Solostar Pen Inject 10 Units into the skin daily. (Patient taking differently: Inject 8 Units into the skin daily.)  ? Insulin Pen Needle (BD AUTOSHIELD  DUO) 30G X 5 MM MISC USE AS DIRECTED WITH INSULIN PEN NEEDLE 4 TIMES DAILY  ? Lancet Device MISC One Touch ultra 2 Delica plus lancet device. Use as directed to monitor sugars.  E11.42  ? Lancets (ONETOUCH ULTRASOFT) lancets Use as instructed  ? latanoprost (XALATAN) 0.005 % ophthalmic solution Place 1 drop into both eyes at bedtime.  ? lisinopril (ZESTRIL) 20 MG tablet Take 1 tablet (20 mg total) by mouth daily.  ? lisinopril (ZESTRIL) 40 MG tablet Take 1 tablet (40 mg total) by mouth daily.  ? Multiple Vitamin (MULTIVITAMIN) tablet Take 1 tablet by  mouth daily. Prn  ? mupirocin ointment (BACTROBAN) 2 % Apply to right great toe once daily (Patient not taking: Reported on 04/10/2021)  ? nitroGLYCERIN (NITROSTAT) 0.4 MG SL tablet DISSOLVE ONE TABLET UNDER TONGUE AS DIRECTED FOR CHEST PAIN (Patient taking differently: Place 0.4 mg under the tongue every 5 (five) minutes as needed for chest pain.)  ? omeprazole (PRILOSEC) 20 MG capsule Take 1 capsule (20 mg total) by mouth daily.  ? Turmeric 500 MG CAPS Take 1 capsule by mouth daily in the afternoon.  ? ?No facility-administered encounter medications on file as of 05/26/2021.  ? ?Patient Active Problem List  ? Diagnosis Date Noted  ? Paroxysmal A-fib ([place]) 01/12/2020  ? H/O thyroid storm 01/06/2020  ? Spinal stenosis 01/06/2020  ? Chronic kidney disease, stage 3a ([place]) 01/03/2020  ? Microcytic anemia 01/03/2020  ? Acute on chronic diastolic CHF (congestive heart failure) ([place]) 01/03/2020  ? Physical deconditioning 01/03/2020  ? Generalized osteoarthritis of hand 06/01/2019  ? Peripheral neuropathy 06/01/2019  ? Degenerative arthritis of right knee 12/15/2018  ? Knee osteoarthritis 09/08/2018  ? Pes planus 12/01/2017  ? Foot deformity, acquired, left 11/04/2017  ? Degenerative disc disease, lumbar 08/01/2016  ? Glaucoma 07/23/2016  ? Numbness 04/23/2016  ? Chronic pain of right knee 12/28/2015  ? Lower back pain 10/24/2015  ? Pain in both feet 10/24/2015  ? Abnormality of gait 04/01/2013  ? Posterior tibial tendon dysfunction 04/01/2013  ? Diabetes ([place]) 04/01/2013  ? Ankle pain, left 10/20/2010  ? UNSPECIFIED VITAMIN D DEFICIENCY 11/08/2008  ? EDEMA- LOCALIZED 11/08/2008  ? Sickle-cell trait ([place]) 01/01/2008  ? Esophageal reflux 09/02/2007  ? GOITER, MULTINODULAR 11/26/2006  ? Coronary artery disease involving native coronary artery of native heart without angina pectoris 11/26/2006  ? HIATAL HERNIA 11/26/2006  ? HYPERCHOLESTEROLEMIA 05/27/2006  ? Essential hypertension 05/27/2006  ? ?Conditions to be  addressed/monitored:  CHF and DMII ? ?Care Plan : RN Care Manager Plan of Care  ?Updates made by Knox Royalty, RN since 05/26/2021 12:00 AM  ?  ? ?Problem: Chronic Disease Management Needs   ?Priority: High  ?  ? 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 ?[facility] Program through [employer] participant-- for home safety modifications/ interventions ?Has ramp outside back entry-- reports does not use front entry "at all" ?Reports has paid caregiver 3 x/ week through "[employer];" states caregiver assists with household chores primarily, occasionally assists patient with hygiene; patient reports she is not happy with her current caregiver because  she brings her "infant grandchild" along with her, which makes patient uncomfortable: encouraged patient to follow up with this agency about her concerns; she  states she has been planning and will do this in the near future ?05/26/21: was made aware 05/19/21 by [facility] RN that patient reported to her that she no longer has in-home care assistance through her medicaid benefit- CSW referral referral facilitated accordingly ?Confirmed CCM CSW actively involved in patient's care needs- CCM CSW outreached patient 05/24/21, with ongoing follow up scheduled ?High fall risk due to impaired mobility (uses walker) and chronic pain/ potential for hypoglycemia ?SDOH needs for transportation/ food/ financial resources: confirmed [facility] currently involved ?05/26/21: confirmed [employer] outreached patient and provided resources as  requested; patient confirms she has spoken to [employer]; she denies ongoing need for resources today; states she plans to begin using her insurance benefit for transportation "soon;" encouraged her to call well

## 2021-05-26 NOTE — Patient Instructions (Signed)
Visit Information ? ?[name], thank you for taking time to talk with me today. Please don't hesitate to contact me if I can be of assistance to you before our next scheduled telephone appointment ? ?Below are the goals we discussed today:  ?Patient Self-Care Activities: ?Patient Rebecca [name] will: ?Take medications as prescribed ?Attend all scheduled provider appointments-- remember to call your insurance company to arrange transportation to your doctors visits a week before the appointment is scheduled ?Call pharmacy for medication refills ?Call provider office for new concerns or questions ?Continue to check fasting (first thing in the morning, before eating) blood sugars at home, and then again later in day, 2 hours after eating a regular meal- the blood sugars we reviewed today are all in very good range ?Continue to follow heart healthy, low salt, low cholesterol, carbohydrate-modified, low sugar diet ?Keep up the great work avoiding falls in your home-- keep working with the [facility] [employer] team to make modifications to your home and to learn ways you can specifically prevent falls; keep using your walker and taking your time; don't rush to get tasks completed ?Stay in touch with the Social Worker and the Pharmacist at Dr. Lawerance BachBurns' office ? ?Our next scheduled telephone follow up visit/ appointment is scheduled on: Wednesday, Jul 19, 2021 at 2:15 pm- This is a PHONE CALL appointment ? ?If you need to cancel or re-schedule our visit, please call [phone] and our care guide team will be happy to assist you. ?  ?I look forward to hearing about your progress. ?  ?Caryl PinaLaine Mckinney Leyani Gargus, RN, BSN, CCRN Alumnus ?CCM Clinic RN Care Coordination- [facility] ?[phone]) [phone]: direct office ? ?If you are experiencing a Mental Health or Behavioral Health Crisis or need someone to talk to, please all the Suicide and Crisis Lifeline: 988 ?call the [place] National Suicide Prevention  Lifeline: 782-274-57881-805-801-5459 or TTY: 604-303-22081-800-799-4 TTY (548) 679-2592(1-928-221-2668) to talk to a trained counselor ?call 1-800-273-TALK (toll free, 24 hour hotline) ?go to [facility] [address], [place] [phone]) ?call 911  ? ?The patient verbalized understanding of instructions, educational materials, and care plan provided today and agreed to receive a mailed copy of patient instructions, educational materials, and care plan ? ?Living With Heart Failure ?Heart failure is a long-term (chronic) condition in which the heart cannot pump enough blood through the body. When this happens, parts of the body do not get the blood and oxygen they need. ?There is no cure for heart failure at this time, so it is important for you to take good care of yourself and follow the treatment plan you set with your health care provider. If you are living with heart failure, there are ways to help you manage the disease. ?How to manage lifestyle changes ?Living with heart failure requires you to make changes in your life. Your health care team will teach you about the changes you need to make in order to relieve your symptoms and lower your risk of going to the hospital. Work with your health care provider to develop a treatment plan that works for you. ?Activity ?Ask your health care provider about attending cardiac rehabilitation. These programs include aerobic physical activity, which provides many benefits for your heart. ?If no cardiac rehabilitation program is available, ask your health care provider what aerobic exercises are safe for you to do. ?Return to your normal activities as told by your health care provider. Ask your health care provider what activities are safe  for you. ?Pace your daily activities and allow time for rest as needed. ?Managing stress ?It is normal to have many emotions about your diagnosis, such as fear, sadness, anger, and loss. If you feel any of these emotions and need  help coping, contact your health care provider. Here are some ways to help yourself manage these emotions: ?Talk to friends and family members about your condition. They can give you support and guidance. Explain your symptoms to them and, if comfortable, invite them to attend appointments or rehabilitation with you. ?Join a support group for people with chronic heart failure. Talking with other people who have the same symptoms may give you new ways of coping with your disease and your emotions. ?Accept help from others. Do not be ashamed if you need help with certain tasks. ?Use stress management techniques, such as meditation, breathing exercises, or listening to relaxing music. ?Conditions such as depression and anxiety are common in persons with heart failure. Pay attention to changes in your mood, emotions, and stress levels. Tell your health care provider if you have any of the following symptoms: ?Trouble sleeping or a change in your sleeping patterns. ?Feeling sad, down, or depressed more often than not, every day for more than 2 weeks. ?Losing interest in activities you normally enjoy. ?Feeling irritable or crying for no reason. ?Finding yourself worrying about the future often. ?Work ?You may need to develop a plan with your health care provider if heart failure interferes with your ability to work. This may include: ?Reducing your work hours. ?Finding functions that are less active or require less effort. ?Planning rest periods during your work hours. ?Travel ?Talk with your health care provider if you plan to travel. There may be circumstances in which your health care provider recommends that you do not travel or that you delay travel until your condition is under control. ?When you travel, bring your medicine and a list of your medicines. If you are traveling by air, keep your medicines with you in a carry-on bag. ?Consider finding a medical facility in the area you will be traveling to and determine  what your health insurance will cover. ?If you will be traveling by public transportation (airplane, train, bus), contact the company prior to traveling if you have special needs. This may include needs related to diet, oxygen, a wheelchair, a seating request, or help with luggage. ?If you use oxygen, make sure to bring enough oxygen with you. ?If you have a battery-powered device, bring a fully charged extra battery with you. ?If you have a device, bring a note from your health care provider and inform all security screening personnel that you have the device. You may need to go through special screening for safety. ?Sexual activity ? ?Ask your health care provider when it is safe for you to resume sexual activity. ?You may need to start slowly and gradually increase intimacy. You can increase intimacy by doing such things as caressing, touching, and holding each other. ?Get regular exercise as told by your health care provider. This can benefit your sex life by building strength and endurance. ?Sleep ?If your condition interferes with your sleep, find ways to improve your sleep quality, such as: ?Sleep lying on your side, or sleep with your head elevated by raising the head of your bed or using multiple pillows. ?Ask your health care provider about screening for sleep apnea. ?Try to go to sleep and wake up at the same times every day. ?Sleep in a dark,  cool room. ?Do not do any physical activity or eating for a few hours before bedtime. ?Plan rest periods during the day, but do not take long naps during the day. ? ?Where to find support ?Consider talking with: ?Family members. ?Close friends. ?A mental health professional or therapist. ?A member of your church, faith, or community group. ?Other sources of support include: ?Local support groups. Ask your health care provider about groups near you. ?Online support groups, such as those found through the American Heart Association: supportnetwork.heart.org ?Local  home care agencies, community agencies, or social agencies. ?A palliative care specialist. Palliative care can help you manage symptoms, promote comfort, improve quality of life, and maintain dignity. ?Where to fin

## 2021-05-26 NOTE — Telephone Encounter (Signed)
1.Medication Requested: insulin aspart (NOVOLOG) 100 UNIT/ML FlexPen ? ?2. Pharmacy (Name, Street, [facility]): [facility] - [place], [place] - [address]  ?Phone:  [phone] ?Fax:  [phone] ? ? ?3. On Med List: yes ? ?4. Last Visit with PCP: 11.04.22 ? ?5. Next visit date with PCP: ? ? ?Agent: Please be advised that RX refills may take up to 3 business days. We ask that you follow-up with your pharmacy.  ?

## 2021-05-26 NOTE — Telephone Encounter (Signed)
Sent in today 

## 2021-05-29 NOTE — Progress Notes (Signed)
?  Subjective:  ?Patient ID: Rebecca [name], female    DOB: [birth year],  MRN: [id number] ? ?Rebecca [name] presents to clinic today for at risk foot care. Pt has h/o NIDDM with PAD and painful elongated mycotic toenails 1-5 bilaterally which are tender when wearing enclosed shoe gear. Pain is relieved with periodic professional debridement. ? ?New problem(s): None.  ? ?PCP is Pincus Sanes, MD , and last visit was January 13, 2021. ? ?Allergies  ?Allergen Reactions  ? Codeine   ?  "Makes me drunk"  ? ? ?Review of Systems: Negative except as noted in the HPI. ? ?Objective: No changes noted in today's physical examination. ?Rebecca [name] is a pleasant 88 y.o. female in NAD. AAO X 3. ? ?Vascular Examination: ?CFT <4 seconds b/l LE. Faintly palpable DP pulses b/l LE. Faintly palpable PT pulse(s) b/l LE. Pedal hair absent. No pain with calf compression b/l. +2 pitting edema BLE. No ischemia or gangrene noted b/l LE. No cyanosis or clubbing noted b/l LE. ? ?Dermatological Examination: ?Pedal skin is warm and supple b/l LE. No open wounds b/l LE. No interdigital macerations noted b/l LE. Toenails 1-5 left, R hallux, R 3rd toe, R 4th toe, and R 5th toe elongated, discolored, dystrophic, thickened, and crumbly with subungual debris and tenderness to dorsal palpation. Onycholysis of R hallux.distal 1/2 nailplate of right hallux.  The nailbed remains intact. ? ?Musculoskeletal Examination: ?Muscle strength 5/5 to all lower extremity muscle groups bilaterally. Severely collapsed flatfoot of the left lower extremity. Wearing appropriate fitting shoe gear. Utilizes rollator for ambulation assistance. ? ?Neurological Examination: ?Pt has subjective symptoms of neuropathy. Protective sensation intact 5/5 intact bilaterally with 10g monofilament b/l. Vibratory sensation diminished b/l. ? ?Hemoglobin A1C Latest Ref Rng & Units 01/13/2021  ?HGBA1C <5.7 % of total Hgb 5.3  ?Some recent data might be hidden   ? ?Assessment/Plan: ?1. Onychomycosis   ?2. Diabetic peripheral neuropathy associated with type 2 diabetes mellitus (HCC)   ?  ?-Examined patient. ?-Mycotic toenails 1-5 bilaterally were debrided in length and girth with sterile nail nippers and dremel without iatrogenic bleeding. ?-Loose nailplate R hallux gently debrided to level of adherence. Digit cleansed with alcohol. triple antibiotic ointment applied to nailbed followed by light dressing. No further treatment required by patient. ?-Patient/POA to call should there be question/concern in the interim.  ? ?Return in about 3 months (around 08/23/2021). ? ?Freddie Breech, DPM  ?

## 2021-06-09 DIAGNOSIS — I509 Heart failure, unspecified: Secondary | ICD-10-CM

## 2021-06-09 DIAGNOSIS — E1159 Type 2 diabetes mellitus with other circulatory complications: Secondary | ICD-10-CM | POA: Diagnosis not present

## 2021-06-09 DIAGNOSIS — Z794 Long term (current) use of insulin: Secondary | ICD-10-CM

## 2021-06-09 DIAGNOSIS — I110 Hypertensive heart disease with heart failure: Secondary | ICD-10-CM

## 2021-06-13 DIAGNOSIS — H401132 Primary open-angle glaucoma, bilateral, moderate stage: Secondary | ICD-10-CM | POA: Diagnosis not present

## 2021-06-13 DIAGNOSIS — R6889 Other general symptoms and signs: Secondary | ICD-10-CM | POA: Diagnosis not present

## 2021-06-13 DIAGNOSIS — E113593 Type 2 diabetes mellitus with proliferative diabetic retinopathy without macular edema, bilateral: Secondary | ICD-10-CM | POA: Diagnosis not present

## 2021-06-13 DIAGNOSIS — H26491 Other secondary cataract, right eye: Secondary | ICD-10-CM | POA: Diagnosis not present

## 2021-06-13 DIAGNOSIS — H04123 Dry eye syndrome of bilateral lacrimal glands: Secondary | ICD-10-CM | POA: Diagnosis not present

## 2021-06-26 ENCOUNTER — Encounter: Payer: Self-pay | Admitting: Licensed Clinical Social Worker

## 2021-06-26 ENCOUNTER — Ambulatory Visit (INDEPENDENT_AMBULATORY_CARE_PROVIDER_SITE_OTHER): Payer: Medicare HMO | Admitting: Licensed Clinical Social Worker

## 2021-06-26 DIAGNOSIS — I5032 Chronic diastolic (congestive) heart failure: Secondary | ICD-10-CM

## 2021-06-26 DIAGNOSIS — E1122 Type 2 diabetes mellitus with diabetic chronic kidney disease: Secondary | ICD-10-CM

## 2021-06-26 NOTE — Patient Instructions (Addendum)
Visit Information ? ?Thank you for taking time to visit with me today. Please don't hesitate to contact me if I can be of assistance to you before our next scheduled telephone appointment. ? ?Following are the goals we discussed today: Level of Care ? ?Task & activities to accomplish goals: ?Remember to call about  transportation to your appointment 1 week before your next appointment with Dr. Lawerance Bach on May 5th 3:20   ?I have placed a referral for the CAPS program to assist with getting you an aide, You will receive a packet in the mail, one form for you to complete and the other will be for Dr. Lawerance Bach to complete. ?I called Department of Social Services and left a message for In-Home Aide program to follow up on the wait list.    ?You may call them if no one has called you  [phone]  ?Our next appointment is by telephone on May 22nd at 9:00 ? ?Please call the care guide team at [phone] if you need to cancel or reschedule your appointment.  ? ?If you are experiencing a Mental Health or Behavioral Health Crisis or need someone to talk to, please call 1-800-273-TALK (toll free, 24 hour hotline)  ? ?The patient verbalized understanding of instructions, educational materials, and care plan provided today and agreed to receive a mailed copy of patient instructions, educational materials, and care plan.  ? ? Sammuel Hines, LCSW ?Licensed Clinical Social Worker [name]  ?[facility]  ?[phone]  ?

## 2021-06-26 NOTE — Chronic Care Management (AMB) (Signed)
?Chronic Care Management  ? Clinical Social Work Note ? ?06/26/2021 ?Name: [name] MRN: [id number] DOB: 1932-03-27 ? ?Rebecca [name] is a 88 y.o. year old female who is a primary care patient of Burns, [name], MD. The CCM team was consulted to assist the patient with chronic disease management and/or care coordination needs related to: Level of Care Concerns.  ? ?Engaged with patient by telephone for follow up visit in response to provider referral for social work chronic care management and care coordination services.  ? ?Consent to Services:  ?The patient was given information about Chronic Care Management services, agreed to services, and gave verbal consent prior to initiation of services.  Please see initial visit note for detailed documentation.  ? ?Patient agreed to services and consent obtained.  ? ?Summary: Assessed patient's current treatment, progress, coping skills, support system and barriers to care.  She continues to experience difficulty with meeting her ADL's. Trying to remain as active as possible going to church and out with family. Remains on wait list for in-home Aide program and has not received CAPS application.  Not able to move forward with [facility] Financial assistance due to billing being in collections .Marland KitchenOver all patient reports doing well.  See Care Plan below for interventions and patient self-care actives. ? ?Follow up Plan: Patient would like continued follow-up from CCM LCSW.  per patient's request will follow up in 30 days.  Will call office if needed prior to next encounter. ?  ?Assessment: Review of patient past medical history, allergies, medications, and health status, including review of relevant consultants reports was performed today as part of a comprehensive evaluation and provision of chronic care management and care coordination services.    ? ?SDOH (Social Determinants of Health) assessments and interventions performed:   ? ?Advanced Directives Status: Not  addressed in this encounter. ? ?CCM Care Plan ? ?Conditions to be addressed/monitored: ; Level of care concerns ? ?Care Plan : LCSW Care Plan  ?Updates made by Soundra Pilon, LCSW since 06/26/2021 12:00 AM  ?  ? ?Problem: Mobility and Independence with ADL's   ?  ? ?Long-Range Goal: Mobility and Independence Optimized with additional home support   ?Start Date: 05/24/2021  ?This Visit's Progress: On track  ?Recent Progress: On track  ?Priority: High  ?Note:   ?Current Barriers:  ?Financial constraints related to fixed income, medical bills and Limited social support  ?Level of Care Concerns:Inability to perform ADL's independently ?Lacks knowledge of how to connect  ? ?CSW Clinical Goal(s):  ?Patient  will  work with CAPS program and In-Home Aide program  through collaboration with Visual merchandiser, provider, and care team.  ? ?Interventions: ?1:1 collaboration with primary care provider regarding development and update of comprehensive plan of care as evidenced by provider attestation and co-signature ?Inter-disciplinary care team collaboration (see longitudinal plan of care) ?Evaluation of current treatment plan related to  self management and patient's adherence to plan as established by provider ?Review resources, discussed options and provided patient information about  ?Department of Social Services ( does not qualify for Medicaid ) ?Transportation provided by insurance provider (Will call insurance provider 3 days prior to appointments) ?Referral to care guide (Has been placed by CCM RN)  ?PACE Program (not interested but willing to review information ) ?Community Alternative Program  (CAP) ?Private pay options for personal care needs :(unable to pay at this time) ?[facility] in-home aide program:(called to place patient on wait list [phone]) ?Meals on wheels (  currently on wait list) ? ?Level of Care Concerns in a patient with HTN and DMII:  (Status: Goal on Track (progressing): YES.) ?Current level of  care: home, alone ?Evaluation of patient safety in current living environment ?Solution-Focused Strategies employed:  ?Active listening / Reflection utilized  ?Problem Solving /Task Center strategies reviewed ?Collaborated with CAPS program Application mailed out, will send another, patient did not received it. Also reached out to DSS In-Home Aide program for update, left message for Michel Harrow [phone] ? ?Task & activities to accomplish goals: ?Remember to call about  transportation to your appointment 1 week before your next appointment with Dr. Lawerance Bach on May 5th 3:20   ?I have placed a referral for the CAPS program to assist with getting you an aide, You will receive a packet in the mail, one form for you to complete and the other will be for Dr. Lawerance Bach to complete. ?I called Department of Social Services and left a message for In-Home Aide program to follow up on the wait list.    ?You may call them if no one has called you  [phone]  ? ?  ? Sammuel Hines, LCSW ?Licensed Clinical Social Worker [name]  ? [facility]   ?[phone]  ?

## 2021-07-05 ENCOUNTER — Other Ambulatory Visit: Payer: Self-pay | Admitting: Internal Medicine

## 2021-07-12 ENCOUNTER — Ambulatory Visit (INDEPENDENT_AMBULATORY_CARE_PROVIDER_SITE_OTHER): Payer: Medicare HMO | Admitting: Licensed Clinical Social Worker

## 2021-07-12 DIAGNOSIS — I5032 Chronic diastolic (congestive) heart failure: Secondary | ICD-10-CM

## 2021-07-12 DIAGNOSIS — N1831 Chronic kidney disease, stage 3a: Secondary | ICD-10-CM

## 2021-07-13 ENCOUNTER — Encounter: Payer: Self-pay | Admitting: Licensed Clinical Social Worker

## 2021-07-13 ENCOUNTER — Ambulatory Visit: Payer: Self-pay | Admitting: Licensed Clinical Social Worker

## 2021-07-13 ENCOUNTER — Other Ambulatory Visit: Payer: Self-pay | Admitting: *Deleted

## 2021-07-13 ENCOUNTER — Encounter: Payer: Self-pay | Admitting: Internal Medicine

## 2021-07-13 DIAGNOSIS — N1831 Chronic kidney disease, stage 3a: Secondary | ICD-10-CM

## 2021-07-13 DIAGNOSIS — I5032 Chronic diastolic (congestive) heart failure: Secondary | ICD-10-CM

## 2021-07-13 NOTE — Patient Instructions (Signed)
Goals Addressed   ? ?  ?  ?  ?  ?  ? This Visit's Progress  ?   Patient Stated   On track  ?   I want to stay as active as possible by continuing with stretching and chair exercises. Continue to enjoy church and family.  ? ?  ?   Patient Stated (pt-stated)   On track  ?   Aging Gracefully RN Goals: ? ?Goal: Patient will report better understanding of how to use her lancet device in the next 120 days. ? ? ?07/12/21 - Rescheduled appointment to 07/13/21 ?07/13/21  Assessment:  Patient reports ability to self check blood sugar with lancet and glucometer.  CBG today 135.  Reports eating late yesterday which she feels was cause of elevated CBG today, "usually doesn't run that high".  Education given. ?  ?   Patient Stated   On track  ?   Aging Gracefully RN Goal: ? ?Goal: Patient will report talking with social worker and getting help with transportation problems in the next 60 days. ? ? ?07/12/21 Appointment rescheduled to 07/13/21 ? ?07/13/21 Getting transportation benefits through [employer]. Her son is also helping when he can get off work to take her to her appointments.  Education given to work with his schedule to set up appointments.   ? ?[name], MSN, RN, CCM, CNS  ?Aging Gracefully ?[place]  [facility] ?Clinical Nurse Specialist ?[email or web address] ?Direct Dial:  [phone]:  [phone] ? ?  ?  ?   Patient Stated   On track  ?   Aging Gracefully RN ? ?Goal: Patient will report no falls in the next 120 days. ? ? 07/12/21 - Rescheduled visit for 07/13/21 ?07/13/21 - Assessment: Client reports no falls this visits ? ?Interventions - Continue home exercises and using assistive devices when ambulating.  Wear skid resistant shoes.     ? ?[name], MSN, RN, CCM, CNS  ?Aging Gracefully ?  [facility] ?Clinical Nurse Specialist ?[email or web address] ?Direct Dial:  [phone]:  [phone] ?  ? ?  ?  ?

## 2021-07-13 NOTE — Chronic Care Management (AMB) (Signed)
?Chronic Care Management  ? Clinical Social Work Note ? ?07/13/2021 ?Name: Rebecca [name] MRN: [id number] DOB: 1932-11-20 ? ?Rebecca [name] is a 88 y.o. year old female who is a primary care patient of Burns, [name], MD. The CCM team was consulted to assist the patient with chronic disease management and/or care coordination needs related to: Level of Care Concerns.  ? ?Engaged with patient by telephone for follow up visit in response to provider referral for social work chronic care management and care coordination services.  ? ?Consent to Services:  ?The patient was given information about Chronic Care Management services, agreed to services, and gave verbal consent prior to initiation of services.  Please see initial visit note for detailed documentation.  ? ?Patient agreed to services and consent obtained.  ? ?Summary:  LCSW assisted patient with understanding CAP program application and completing her section of the packet. She will take part B to office so that PCP will complete the medication section and  have it faxed to CAP program. LCSW will collaborate with PCP. Marland Kitchen  See Care Plan below for interventions and patient self-care actives. ? ?Follow up Plan: Patient would like continued follow-up from CCM LCSW.  per patient's request will follow up in 30 days.  Will call office if needed prior to next encounter. ?  ?Assessment: Review of patient past medical history, allergies, medications, and health status, including review of relevant consultants reports was performed today as part of a comprehensive evaluation and provision of chronic care management and care coordination services.    ? ?SDOH (Social Determinants of Health) assessments and interventions performed:   ? ?Advanced Directives Status: Not addressed in this encounter. ? ?CCM Care Plan ?Conditions to be addressed/monitored: ; Level of care concerns ? ?Care Plan : LCSW Care Plan  ?Updates made by Maurine Cane, LCSW since 07/13/2021 12:00  AM  ?  ? ?Problem: Mobility and Independence with ADL's   ?  ? ?Long-Range Goal: Mobility and Independence Optimized with additional home support   ?Start Date: 05/24/2021  ?This Visit's Progress: On track  ?Recent Progress: On track  ?Priority: High  ?Note:   ?Current Barriers:  ?Financial constraints related to fixed income, medical bills and Limited social support  ?Level of Care Concerns:Inability to perform ADL's independently ?Lacks knowledge of how to connect  ? ?CSW Clinical Goal(s):  ?Patient  will  work with CAPS program and In-Home Aide program  through collaboration with [facility] representative, provider, and care team.  ? ?Interventions: ?1:1 collaboration with primary care provider regarding development and update of comprehensive plan of care as evidenced by provider attestation and co-signature ?Inter-disciplinary care team collaboration (see longitudinal plan of care) ?Evaluation of current treatment plan related to  self management and patient's adherence to plan as established by provider ?Review resources, discussed options and provided patient information about  ?Department of Social Services ( does not qualify for Medicaid ) ?Transportation provided by insurance provider (Will call insurance provider 3 days prior to appointments) ?Referral to care guide (Has been placed by CCM RN)  ?PACE Program (not interested but willing to review information ) ?Community Alternative Program  (CAP) ?Private pay options for personal care needs :(unable to pay at this time) ?[facility] in-home aide program:(called to place patient on wait list [phone]) ?Meals on wheels (currently on wait list) ? ?Level of Care Concerns in a patient with HTN and DMII:  (Status: Goal on Track (progressing): YES.) ?Current level of care: home, alone ?  Evaluation of patient safety in current living environment ?Solution-Focused Strategies employed:  ?Active listening / Reflection utilized  ?Problem Solving /Task Center strategies  reviewed ?Assisted with completing CAPS application  ? ?Task & activities to accomplish goals: ?Remember to call about  transportation to your appointment 1 week before your next appointment with Dr. Quay Burow on May 5th 3:20   ?Take CAPS application to Dr. Quay Burow ?  ?  ?Casimer Lanius, LCSW ?Licensed Clinical Social Worker [facility]  ?[place]  ?[phone]  ? ? ?

## 2021-07-13 NOTE — Patient Instructions (Signed)
? ? ? ?  It was a pleasure speaking with you today. ?per your request your appointment is scheduled 07/13/21 to review CAP information with you ? ?Sammuel Hines, LCSW ?Licensed Clinical Social Worker [name]  ?[place] [facility]  ?[phone]  ?

## 2021-07-13 NOTE — Patient Instructions (Addendum)
? ? ?  Follow up with Cardiology.   ? ? ?Blood work was ordered.   ? ? ?Medications changes include :   none ? ? ? ? ?Return in about 6 months (around 01/14/2022) for follow up. ? ?

## 2021-07-13 NOTE — Progress Notes (Signed)
? ? ? ? ?Subjective:  ? ? Patient ID: Rebecca [name], female    DOB: 08-06-1932, 88 y.o.   MRN: [id number] ? ?This visit occurred during the SARS-CoV-2 public health emergency.  Safety protocols were in place, including screening questions prior to the visit, additional usage of staff PPE, and extensive cleaning of exam room while observing appropriate contact time as indicated for disinfecting solutions.   ? ? ?HPI ?Rebecca [name] is here for follow up of her chronic medical problems, including DM, CKD, hld, htn, paroxysmal afib, gerd.  She is here with her son. ? ?She needs [facility] paperwork filled out.  ? ?Wrist and hand weakness - difficulty getting up from chair and opening things.  She knows she has a lot of arthritis.  She does try to use ball to help strengthen her hands, but it does not seem to be helping. ? ?Does exercises from PT on her own at home.  She notes this does help with her strength and mobility. ? ?She is struggling to do things at home and on her own.  Can not do housework.  Needs some help with bathing and dressing.   She does her Trulicity injections once a week and her insulin injections-sometimes it is difficult because of her arthritis.. ? ?Medications and allergies reviewed with patient and updated if appropriate. ? ?Current Outpatient Medications on File Prior to Visit  ?Medication Sig Dispense Refill  ? acetaminophen (TYLENOL) 500 MG tablet Take 1,000 mg by mouth daily as needed for moderate pain.    ? Ascorbic Acid (VITAMIN C) 500 MG tablet Take 500 mg by mouth daily.    ? atorvastatin (LIPITOR) 10 MG tablet TAKE 1 TABLET BY MOUTH ONCE DAILY AT 6PM 90 tablet 1  ? blood glucose meter kit and supplies KIT Dispense based on patient and insurance preference. Use up to four times daily as directed. (FOR E11.9). 1 each 0  ? brimonidine (ALPHAGAN) 0.2 % ophthalmic solution Place 1 drop into the right eye 2 (two) times daily.    ? calcium carbonate (OS-CAL) 600 MG TABS Take 600 mg by mouth  daily.    ? Cod Liver Oil 1000 MG CAPS Take 1,000 mg by mouth daily.     ? Coenzyme Q10 (CO Q-10) 200 MG CAPS Take 2 each by mouth daily in the afternoon.    ? DORZOLAMIDE HCL-TIMOLOL MAL OP Place 1 drop into the right eye 2 (two) times daily.    ? Dulaglutide (TRULICITY) 1.5 HA/5.7XU SOPN Inject 1.5 mg into the skin once a week. 6.5 mL 2  ? ELIQUIS 5 MG TABS tablet TAKE 1 TABLET TWICE DAILY (APPOINTMENT IS NEEDED FOR MORE REFILLS) 180 tablet 2  ? gabapentin (NEURONTIN) 300 MG capsule Take 1 capsule (300 mg total) by mouth daily. 90 capsule 2  ? Glucerna (GLUCERNA) LIQD Take 237 mLs by mouth daily.    ? glucose blood (ACCU-CHEK GUIDE) test strip USE 1 STRIP TO CHECK GLUCOSE UP TO FOUR TIMES DAILY 100 each 11  ? hydrochlorothiazide (HYDRODIURIL) 12.5 MG tablet Take 12.5 mg by mouth daily as needed (swelling).    ? insulin aspart (NOVOLOG) 100 UNIT/ML FlexPen Patient Sig: 0-15 Units, Subcutaneous, 3 times daily with meals.  CBG < 70: Implement Hypoglycemia measures, call MD.  CBG 70 - 120: 0 units.   CBG 121 - 150: 2 units.  CBG 151 - 200: 3 units.  CBG 201 - 250: 5 units.  CBG . 251 - 300: 8 units.  CBG 301 - 350: 11 units.  CBG 351 - 400: 15 units.  CBG > 400: call MD 15 mL 11  ? insulin glargine (LANTUS SOLOSTAR) 100 UNIT/ML Solostar Pen Inject 10 Units into the skin daily. (Patient taking differently: Inject 8 Units into the skin daily.) 15 mL 2  ? Insulin Pen Needle (BD AUTOSHIELD DUO) 30G X 5 MM MISC USE AS DIRECTED WITH INSULIN PEN NEEDLE 4 TIMES DAILY 400 each 2  ? Lancet Device MISC One Touch ultra 2 Delica plus lancet device. Use as directed to monitor sugars.  E11.42 1 each 0  ? Lancets (ONETOUCH ULTRASOFT) lancets Use as instructed 100 each 12  ? latanoprost (XALATAN) 0.005 % ophthalmic solution Place 1 drop into both eyes at bedtime.    ? lisinopril (ZESTRIL) 40 MG tablet Take 1 tablet (40 mg total) by mouth daily. 90 tablet 2  ? Multiple Vitamin (MULTIVITAMIN) tablet Take 1 tablet by mouth daily. Prn     ? mupirocin ointment (BACTROBAN) 2 % Apply to right great toe once daily 30 g 1  ? nitroGLYCERIN (NITROSTAT) 0.4 MG SL tablet DISSOLVE ONE TABLET UNDER TONGUE AS DIRECTED FOR CHEST PAIN (Patient taking differently: Place 0.4 mg under the tongue every 5 (five) minutes as needed for chest pain.) 25 tablet 5  ? omeprazole (PRILOSEC) 20 MG capsule TAKE 1 CAPSULE EVERY DAY (STOP PANTOPRAZOLE) 90 capsule 1  ? Turmeric 500 MG CAPS Take 1 capsule by mouth daily in the afternoon.    ? ?No current facility-administered medications on file prior to visit.  ? ? ? ?Review of Systems  ?Constitutional:  Negative for chills and fever.  ?Respiratory:  Positive for shortness of breath (occ). Negative for cough and wheezing.   ?Cardiovascular:  Negative for chest pain, palpitations and leg swelling.  ?Neurological:  Negative for light-headedness and headaches.  ? ?   ?Objective:  ? ?Vitals:  ? 07/14/21 1518  ?BP: 140/72  ?Pulse: 61  ?Temp: 98.1 ?F (36.7 ?C)  ?SpO2: 99%  ? ?BP Readings from Last 3 Encounters:  ?07/14/21 140/72  ?03/03/21 138/70  ?01/13/21 120/60  ? ?Wt Readings from Last 3 Encounters:  ?07/14/21 175 lb (79.4 kg)  ?03/03/21 179 lb 3.2 oz (81.3 kg)  ?01/13/21 183 lb (83 kg)  ? ?Body mass index is 31 kg/m?. ? ?  ?Physical Exam ?Constitutional:   ?   General: She is not in acute distress. ?   Appearance: Normal appearance.  ?HENT:  ?   Head: Normocephalic and atraumatic.  ?Eyes:  ?   Conjunctiva/sclera: Conjunctivae normal.  ?Cardiovascular:  ?   Rate and Rhythm: Normal rate and regular rhythm.  ?   Heart sounds: Normal heart sounds. No murmur heard. ?Pulmonary:  ?   Effort: Pulmonary effort is normal. No respiratory distress.  ?   Breath sounds: Normal breath sounds. No wheezing.  ?Musculoskeletal:  ?   Cervical back: Neck supple.  ?   Right lower leg: Edema (Trace) present.  ?   Left lower leg: Edema (Trace) present.  ?Lymphadenopathy:  ?   Cervical: No cervical adenopathy.  ?Skin: ?   General: Skin is warm and dry.  ?    Findings: No rash.  ?Neurological:  ?   Mental Status: She is alert. Mental status is at baseline.  ?Psychiatric:     ?   Mood and Affect: Mood normal.     ?   Behavior: Behavior normal.  ? ?   ? ?Lab Results  ?Component  Value Date  ? WBC 5.8 01/13/2021  ? HGB 10.9 (L) 01/13/2021  ? HCT 33.7 (L) 01/13/2021  ? PLT 220 01/13/2021  ? GLUCOSE 136 (H) 01/13/2021  ? CHOL 197 01/13/2021  ? TRIG 86 01/13/2021  ? HDL 85 01/13/2021  ? [place] 94 01/13/2021  ? ALT 10 01/13/2021  ? AST 16 01/13/2021  ? NA 142 01/13/2021  ? K 5.0 01/13/2021  ? CL 109 01/13/2021  ? CREATININE 1.22 (H) 01/13/2021  ? BUN 34 (H) 01/13/2021  ? CO2 27 01/13/2021  ? TSH 0.74 01/13/2021  ? INR 1.9 (H) 02/05/2020  ? HGBA1C 5.3 01/13/2021  ? MICROALBUR 1.9 10/24/2015  ? ? ? ?Assessment & Plan:  ? ? ?See Problem List for Assessment and Plan of chronic medical problems.  ? ? ?

## 2021-07-13 NOTE — Chronic Care Management (AMB) (Signed)
?Chronic Care Management  ? Clinical Social Work Note ? ?07/13/2021 ?Name: Rebecca [name] MRN: [id number] DOB: 03-Sep-1932 ? ?Rebecca [name] is a 88 y.o. year old female who is a primary care patient of Burns, [name], MD. The CCM team was consulted to assist the patient with chronic disease management and/or care coordination needs related to: Level of Care Concerns.  ? ?Engaged with patient by telephone for follow up visit in response to provider referral for social work chronic care management and care coordination services.  ? ?Consent to Services:  ?The patient was given information about Chronic Care Management services, agreed to services, and gave verbal consent prior to initiation of services.  Please see initial visit note for detailed documentation.  ? ?Patient agreed to services and consent obtained.  ? ?Summary:  Patient received CAP paper work and not sure how to complete it .  See Care Plan below for interventions and patient self-care actives. ? ?Recommendation: Patient may benefit from, and is in agreement for LCSW to assist with reviewing CAP paperwork. Will schedule phone appointment 07/13/21 to review information with patient.  ? ?Follow up Plan: Patient would like continued follow-up from CCM LCSW.  per patient's request will follow up in 1 day.  Will call office if needed prior to next encounter. ?  ?Assessment: Review of patient past medical history, allergies, medications, and health status, including review of relevant consultants reports was performed today as part of a comprehensive evaluation and provision of chronic care management and care coordination services.    ? ?SDOH (Social Determinants of Health) assessments and interventions performed:   ? ?Advanced Directives Status: Not addressed in this encounter. ? ?CCM Care Plan ?Conditions to be addressed/monitored: ; Level of care concerns ? ?Care Plan : LCSW Care Plan  ?Updates made by Soundra Pilon, LCSW since 07/13/2021 12:00  AM  ?  ? ?Problem: Mobility and Independence with ADL's   ?  ? ?Long-Range Goal: Mobility and Independence Optimized with additional home support   ?Start Date: 05/24/2021  ?This Visit's Progress: On track  ?Recent Progress: On track  ?Priority: High  ?Note:   ?Current Barriers:  ?Financial constraints related to fixed income, medical bills and Limited social support  ?Level of Care Concerns:Inability to perform ADL's independently ?Lacks knowledge of how to connect  ? ?CSW Clinical Goal(s):  ?Patient  will  work with CAPS program and In-Home Aide program  through collaboration with Visual merchandiser, provider, and care team.  ? ?Interventions: ?1:1 collaboration with primary care provider regarding development and update of comprehensive plan of care as evidenced by provider attestation and co-signature ?Inter-disciplinary care team collaboration (see longitudinal plan of care) ?Evaluation of current treatment plan related to  self management and patient's adherence to plan as established by provider ?Review resources, discussed options and provided patient information about  ?Department of Social Services ( does not qualify for Medicaid ) ?Transportation provided by insurance provider (Will call insurance provider 3 days prior to appointments) ?Referral to care guide (Has been placed by CCM RN)  ?PACE Program (not interested but willing to review information ) ?Community Alternative Program  (CAP) ?Private pay options for personal care needs :(unable to pay at this time) ?[facility] in-home aide program:(called to place patient on wait list [phone]) ?Meals on wheels (currently on wait list) ? ?Level of Care Concerns in a patient with HTN and DMII:  (Status: Goal on Track (progressing): YES.) ?Current level of care: home, alone ?Evaluation of  patient safety in current living environment ?Solution-Focused Strategies employed:  ?Active listening / Reflection utilized  ?Problem Solving /Task Center strategies  reviewed ? ?Task & activities to accomplish goals: ?Remember to call about  transportation to your appointment 1 week before your next appointment with Dr. Lawerance Bach on May 5th 3:20   ?  ?  ?Sammuel Hines, LCSW ?Licensed Clinical Social Worker [name]  ?[facility]  ?[phone]  ? ? ?

## 2021-07-13 NOTE — Patient Outreach (Signed)
Aging Gracefully Program ? ?RN Visit ? ?07/13/2021 ? ?[place] ?1932-05-18 ?[id number] ? ?Visit:   3rd visit ? ?Start Time:  [id number]  ?End Time:  [id number]  ?Total Minutes:  50  ? ?Readiness To Change Score:    ? ?Universal RN Interventions: ?Calendar Distribution: Yes (Client states she is using this calendar tool to document all appointments with providers.) ?Exercise Review: Yes (Client states she has incorporated exercise routine into exercises she did while in her recent rehab stay.  States she does these exercises everyday and has not quesitons.) ?Medications: No (Client states she is taking medications as prescribed and has not questions or concerns at this time.) ?Medication Changes: No (Client states there have been no changes in medications.) ?Mood: Yes (Client states she feels down at times as though no one wants to help her, when she can't get family members to take her where she needs to go.  States she has a strong support system with church and has no desire to harm herself.  Education given.) ?Pain: No (Client has not complaint of pain during this visit) ?Fall Prevention: Yes (Client denies falls and is currently using rollator when walking.) ?Incontinence: Yes ?Clinician View Of Client Situation: Client ambulating slowly with rollator to back door to allow this care manager access to home.  Client is in good spirits and happy to visit with this care manager. ?Client View Of His/Her Situation: Client states CBG of 135 this morning.  States she ate dinner later than usual last night which included several slices of bread.  She contributes to the reason her CBG is a little higher than normal.  Client is aware of signs and symptoms of hyperglycemia and hypoglycemia.  Education given about the plate method for managing blood glucose. ? ?Healthcare Provider Communication: ?Did Higher education careers adviser With [employer] Provider?: No ?According to Client, Did PCP Report Communication With An Aging Gracefully RN?:  No ? ?Clinician View of Client Situation: ?Clinician View Of Client Situation: Client ambulating slowly with rollator to back door to allow this care manager access to home.  Client is in good spirits and happy to visit with this care manager. ?Client's View of His/Her Situation: ?Ambulance person Of His/Her Situation: Client states CBG of 135 this morning.  States she ate dinner later than usual last night which included several slices of bread.  She contributes to the reason her CBG is a little higher than normal.  Client is aware of signs and symptoms of hyperglycemia and hypoglycemia.  Education given about the plate method for managing blood glucose. ? ?Medication Assessment: ?Client reports no changes in medications.  ? ?OT Update: N/A ? ?Session Summary: Client is on track with Aging Gracefully goals which she established with care manager.  Client has no additional problems with lancet since receiving new device.  States she is checking blood sugar daily.  Client is frustrated that her PCS or CAP worker is no longer coming to her home and it is difficult for her to get someone to assist her.  This care manager reviewed paperwork and was then advised that she is working with [place].  Contact was made with LCSW as stated above.   ? ?Plan - Next scheduled visit 08/16/21 ? ? ?

## 2021-07-13 NOTE — Patient Outreach (Deleted)
Aging Gracefully Program ? ?RN Visit ? ?07/13/2021 ? ?Rebecca [name] ?04-05-1932 ?[id number] ? ?Visit:    ? ?Start Time:    ?End Time:    ?Total Minutes:    ? ?Readiness To Change Score:    ? ?Universal RN Interventions: ?Calendar Distribution: Yes (Client states she is using this calendar tool to document all appointments with providers.) ?Exercise Review: Yes (Client states she has incorporated exercise routine into exercises she did while in her recent rehab stay.  States she does these exercises everyday and has not quesitons.) ?Medications: No (Client states she is taking medications as prescribed and has not questions or concerns at this time.) ?Medication Changes: No (Client states there have been no changes in medications.) ?Mood: Yes (Client states she feels down at times as though no one wants to help her, when she can't get family members to take her where she needs to go.  States she has a strong support system with church and has no desire to harm herself.  Education given.) ?Pain: No (Client has not complaint of pain during this visit) ?Fall Prevention: Yes (Client denies falls and is currently using rollator when walking.) ?Incontinence: Yes ?Clinician View Of Client Situation: Client ambulating slowly with rollator to back door to allow this care manager access to home.  Client is in good spirits and happy to visit with this care manager. ?Client View Of His/Her Situation: Client states CBG of 135 this morning.  States she ate dinner later than usual last night which included several slices of bread.  She contributes to the reason her CBG is a little higher than normal.  Client is aware of signs and symptoms of hyperglycemia and hypoglycemia.  Education given about the plate method for managing blood glucose. ? ?Healthcare Provider Communication: ?Did Surveyor, mining With [employer] Provider?: No ?According to Client, Did PCP Report Communication With An Aging Gracefully RN?: No ? ?Clinician  View of Client Situation: ?Clinician View Of Client Situation: Client ambulating slowly with rollator to back door to allow this care manager access to home.  Client is in good spirits and happy to visit with this care manager. ?Client's View of His/Her Situation: ?Regulatory affairs officer Of His/Her Situation: Client states CBG of 135 this morning.  States she ate dinner later than usual last night which included several slices of bread.  She contributes to the reason her CBG is a little higher than normal.  Client is aware of signs and symptoms of hyperglycemia and hypoglycemia.  Education given about the plate method for managing blood glucose. ? ?Medication Assessment: ?  ? ?OT Update: *** ? ?Session Summary: *** ? ? ?

## 2021-07-13 NOTE — Patient Instructions (Signed)
Visit Information ? ?Thank you for taking time to visit with me today. Please don't hesitate to contact me if I can be of assistance to you before our next scheduled telephone appointment. ? ?Following are the goals we discussed today: Level of Care ?Worked on your CAPS application ? ?Our next appointment is by telephone on June 5th at 1:15 ? ?Please call the care guide team at [phone] if you need to cancel or reschedule your appointment.  ? ?If you are experiencing a Mental Health or Behavioral Health Crisis or need someone to talk to, please call 1-800-273-TALK (toll free, 24 hour hotline)  ? ?The patient verbalized understanding of instructions, educational materials, and care plan provided today and agreed to receive a mailed copy of patient instructions, educational materials, and care plan.  ? ?Sammuel Hines, LCSW ?Licensed Clinical Social Worker [name]  ?[facility]  ?[phone]  ?

## 2021-07-14 ENCOUNTER — Ambulatory Visit (INDEPENDENT_AMBULATORY_CARE_PROVIDER_SITE_OTHER): Payer: Medicare HMO | Admitting: Internal Medicine

## 2021-07-14 VITALS — BP 140/72 | HR 61 | Temp 98.1°F | Ht 63.0 in | Wt 175.0 lb

## 2021-07-14 DIAGNOSIS — Z8639 Personal history of other endocrine, nutritional and metabolic disease: Secondary | ICD-10-CM

## 2021-07-14 DIAGNOSIS — I10 Essential (primary) hypertension: Secondary | ICD-10-CM

## 2021-07-14 DIAGNOSIS — I480 Paroxysmal atrial fibrillation: Secondary | ICD-10-CM

## 2021-07-14 DIAGNOSIS — Z794 Long term (current) use of insulin: Secondary | ICD-10-CM | POA: Diagnosis not present

## 2021-07-14 DIAGNOSIS — N1831 Chronic kidney disease, stage 3a: Secondary | ICD-10-CM

## 2021-07-14 DIAGNOSIS — E1142 Type 2 diabetes mellitus with diabetic polyneuropathy: Secondary | ICD-10-CM | POA: Diagnosis not present

## 2021-07-14 DIAGNOSIS — D573 Sickle-cell trait: Secondary | ICD-10-CM | POA: Diagnosis not present

## 2021-07-14 DIAGNOSIS — E7800 Pure hypercholesterolemia, unspecified: Secondary | ICD-10-CM

## 2021-07-14 DIAGNOSIS — K219 Gastro-esophageal reflux disease without esophagitis: Secondary | ICD-10-CM | POA: Diagnosis not present

## 2021-07-14 LAB — T3, FREE: T3, Free: 2.4 pg/mL (ref 2.3–4.2)

## 2021-07-14 LAB — COMPREHENSIVE METABOLIC PANEL
ALT: 12 U/L (ref 0–35)
AST: 16 U/L (ref 0–37)
Albumin: 3.7 g/dL (ref 3.5–5.2)
Alkaline Phosphatase: 50 U/L (ref 39–117)
BUN: 31 mg/dL — ABNORMAL HIGH (ref 6–23)
CO2: 28 mEq/L (ref 19–32)
Calcium: 9.4 mg/dL (ref 8.4–10.5)
Chloride: 106 mEq/L (ref 96–112)
Creatinine, Ser: 1.14 mg/dL (ref 0.40–1.20)
GFR: 43.42 mL/min — ABNORMAL LOW (ref >60.00)
Glucose, Bld: 150 mg/dL — ABNORMAL HIGH (ref 70–99)
Potassium: 4.6 mEq/L (ref 3.5–5.1)
Sodium: 141 mEq/L (ref 135–145)
Total Bilirubin: 0.3 mg/dL (ref 0.2–1.2)
Total Protein: 6.4 g/dL (ref 6.0–8.3)

## 2021-07-14 LAB — CBC WITH DIFFERENTIAL/PLATELET
Basophils Absolute: 0.2 10*3/uL — ABNORMAL HIGH (ref 0.0–0.1)
Basophils Relative: 2.7 % (ref 0.0–3.0)
Eosinophils Absolute: 0.3 10*3/uL (ref 0.0–0.7)
Eosinophils Relative: 5.6 % — ABNORMAL HIGH (ref 0.0–5.0)
HCT: 33.2 % — ABNORMAL LOW (ref 36.0–46.0)
Hemoglobin: 10.8 g/dL — ABNORMAL LOW (ref 12.0–15.0)
Lymphocytes Relative: 42.7 % (ref 12.0–46.0)
Lymphs Abs: 2.4 10*3/uL (ref 0.7–4.0)
MCHC: 32.5 g/dL (ref 30.0–36.0)
MCV: 81.5 fl (ref 78.0–100.0)
Monocytes Absolute: 0.6 10*3/uL (ref 0.1–1.0)
Monocytes Relative: 11.5 % (ref 3.0–12.0)
Neutro Abs: 2.1 10*3/uL (ref 1.4–7.7)
Neutrophils Relative %: 37.5 % — ABNORMAL LOW (ref 43.0–77.0)
Platelets: 170 10*3/uL (ref 150.0–400.0)
RBC: 4.08 Mil/uL (ref 3.87–5.11)
RDW: 16 % — ABNORMAL HIGH (ref 11.5–15.5)
WBC: 5.5 10*3/uL (ref 4.0–10.5)

## 2021-07-14 LAB — LIPID PANEL
Cholesterol: 181 mg/dL (ref 0–200)
HDL: 79.5 mg/dL (ref >39.00)
LDL Cholesterol: 85 mg/dL (ref 0–99)
NonHDL: 101.95
Total CHOL/HDL Ratio: 2
Triglycerides: 84 mg/dL (ref 0.0–149.0)
VLDL: 16.8 mg/dL (ref 0.0–40.0)

## 2021-07-14 LAB — TSH: TSH: 0.58 u[IU]/mL (ref 0.35–5.50)

## 2021-07-14 LAB — HEMOGLOBIN A1C: Hgb A1c MFr Bld: 8 % — ABNORMAL HIGH (ref 4.6–6.5)

## 2021-07-14 LAB — T4, FREE: Free T4: 0.97 ng/dL (ref 0.60–1.60)

## 2021-07-14 NOTE — Assessment & Plan Note (Signed)
Chronic Blood pressure well controlled CMP Continue lisinopril 40 mg daily 

## 2021-07-14 NOTE — Assessment & Plan Note (Signed)
History of thyroid storm ?Not currently on medication ?Following with endocrine-Dr. Lucianne Muss ?Check TFTs ?

## 2021-07-14 NOTE — Assessment & Plan Note (Signed)
Chronic Has been stable CMP, CBC 

## 2021-07-14 NOTE — Assessment & Plan Note (Addendum)
Chronic ?Has not seen cardiology in a while due to transportation issues-advised follow-up ?Continue Eliquis 5 mg twice daily ?TFTs, CBC, CMP ?

## 2021-07-14 NOTE — Assessment & Plan Note (Signed)
Chronic GERD controlled Continue omeprazole 20 mg daily  

## 2021-07-14 NOTE — Assessment & Plan Note (Signed)
Chronic Regular exercise and healthy diet encouraged Check lipid panel  Continue atorvastatin 10 mg daily 

## 2021-07-14 NOTE — Assessment & Plan Note (Signed)
Chronic ?Lab Results  ?Component Value Date  ? HGBA1C 5.3 01/13/2021  ? ?Sugars well controlled ?Check A1c ?Continue Trulicity 1.5 mg weekly, Lantus 8 units daily and NovoLog sliding scale ?Stressed regular exercise, diabetic diet ?Will adjust medication as needed-anticipate stopping Lantus.  We will continue NovoLog sliding scale.  Will consider increasing Trulicity depending on A1c ? ?

## 2021-07-17 MED ORDER — TRULICITY 3 MG/0.5ML ~~LOC~~ SOAJ: 3.0000 mg | SUBCUTANEOUS | 2 refills | Status: DC

## 2021-07-17 NOTE — Addendum Note (Signed)
Addended by: Binnie Rail on: 07/17/2021 08:58 PM ? ? Modules accepted: Orders ? ?

## 2021-07-18 ENCOUNTER — Telehealth: Payer: Self-pay

## 2021-07-18 DIAGNOSIS — H26491 Other secondary cataract, right eye: Secondary | ICD-10-CM | POA: Diagnosis not present

## 2021-07-18 DIAGNOSIS — R6889 Other general symptoms and signs: Secondary | ICD-10-CM | POA: Diagnosis not present

## 2021-07-18 DIAGNOSIS — H401132 Primary open-angle glaucoma, bilateral, moderate stage: Secondary | ICD-10-CM | POA: Diagnosis not present

## 2021-07-18 NOTE — Telephone Encounter (Signed)
Pt daughter in law calling for update on forms for CAP program.  ? ? ?Rebecca [name] advised that they are ready for pick up. They will be by maybe tomorrow to pick up ? FYI ?

## 2021-07-19 ENCOUNTER — Ambulatory Visit: Payer: Medicare HMO | Admitting: *Deleted

## 2021-07-19 ENCOUNTER — Telehealth: Payer: Self-pay

## 2021-07-19 DIAGNOSIS — Z794 Long term (current) use of insulin: Secondary | ICD-10-CM

## 2021-07-19 DIAGNOSIS — I5032 Chronic diastolic (congestive) heart failure: Secondary | ICD-10-CM

## 2021-07-19 NOTE — Patient Instructions (Signed)
Visit Information ? ?[name], thank you for taking time to talk with me today. Please don't hesitate to contact me if I can be of assistance to you before our next scheduled telephone appointment ? ?Below are the goals we discussed today:  ?Patient Self-Care Activities: ?Patient Rebecca [name] will: ?Take medications as prescribed ?Attend all scheduled provider appointments-  remember to call your insurance company to arrange transportation to your doctors visits a week before the appointment is scheduled ?Call pharmacy for medication refills ?Call provider office for new concerns or questions ?Continue to check fasting (first thing in the morning, before eating) blood sugars at home, and then again later in day, 2 hours after eating a regular meal- the blood sugars we reviewed today are all in very good range ?Continue to follow heart healthy, low salt, low cholesterol, carbohydrate-modified, low sugar diet ?Keep up the great work avoiding falls in your home-- keep working with the [facility]) team to make modifications to your home and to learn ways you can specifically prevent falls; keep using your walker and taking your time; don't rush to get tasks completed ?Stay in touch with the Social Worker and the Pharmacist at Dr. Quay Burow' office ? ?Our next scheduled telephone follow up visit/ appointment is scheduled on: Wednesday, September 06, 2021 at 2:15 pm- This is a PHONE CALL appointment ? ?If you need to cancel or re-schedule our visit, please call [phone] and our care guide team will be happy to assist you. ?  ?I look forward to hearing about your progress. ?  ?[name], RN, BSN, CCRN Alumnus ?[place] ?[phone]: direct office ? ?If you are experiencing a Mental Health or [place] or need someone to talk to, please  ?call the Suicide and Crisis Lifeline: 988 ?call the [place] National Suicide Prevention  Lifeline: (231)694-4444 or TTY: (431)103-5670 TTY 540-574-6939) to talk to a trained counselor ?call 1-800-273-TALK (toll free, 24 hour hotline) ?go to [facility] [address], [place] [phone]) ?call 911  ? ?The patient verbalized understanding of instructions, educational materials, and care plan provided today and agreed to receive a mailed copy of patient instructions, educational materials, and care plan ? ?Preventing Diabetes Mellitus Complications ?You can help to prevent or slow down problems that are caused by diabetes (diabetes mellitus). Following your diabetes plan and taking care of yourself can reduce your risk of serious or life-threatening complications. ?What actions can I take to prevent diabetes complications? ?Diabetes management ? ?Follow instructions from your health care providers about managing your diabetes. Your diabetes may be managed by a team of health care providers who can teach you how to care for yourself and can answer questions that you have. ?Educate yourself about your condition so you can make healthy choices about eating and physical activity. ?Know your target range for your blood sugar (glucose), and check your blood glucose level as often as told. Your health care provider will help you decide how often to check your blood glucose level depending on your treatment goals and how well you are meeting them. ?Ask your health care provider if you should take low-dose aspirin daily and what dose is recommended for you. Taking low-dose aspirin daily is recommended to help prevent cardiovascular disease. ?Controlling your blood pressure and cholesterol ?Your personal target blood pressure is determined based on: ?Your age. ?Your medicines. ?How long you have had diabetes. ?Any other medical conditions you  have. ?To control your blood pressure: ?Follow instructions from your health care provider about meal planning, exercise, and  medicines. ?Make sure your health care provider checks your blood pressure at every medical visit. ?Monitor your blood pressure at home as told by your health care provider. ?To control your cholesterol: ?Follow instructions from your health care provider about meal planning, exercise, and medicines. ?Have your cholesterol checked at least once a year. ?You may be prescribed medicine to lower cholesterol (statin). If you are not taking a statin, ask your health care provider if you should be. ?Controlling your cholesterol may: ?Help prevent heart disease and stroke. These are the most common health problems for people with diabetes. ?Improve your blood flow. ? ?Medical appointments and vaccines ?Schedule and keep yearly physical exams and eye exams. Your health care provider will tell you how often you need medical visits depending on your diabetes management plan. Keep all follow-up visits as told. This is important so possible problems can be identified early and complications can be avoided or treated. ?Every visit with your health care provider should include measuring your: ?Weight. ?Blood pressure. ?Blood glucose control. ?Your A1C (hemoglobin A1C) level should be checked: ?At least 2 times a year, if you are meeting your treatment goals. ?4 times a year, if you are not meeting treatment goals or if your treatment goals have changed. ?Your blood lipids (lipid profile) should be checked yearly. You should also be checked yearly for protein in your urine (urine microalbumin). ?If you have type 1 diabetes, get an eye exam 3-5 years after you are diagnosed, and then once a year after your first exam. ?If you have type 2 diabetes, get an eye exam as soon as you are diagnosed, and then once a year after your first exam. ?It is also important to keep your vaccines current. It is recommended that you receive: ?A flu (influenza) vaccine every year. ?A pneumonia (pneumococcal) vaccine and a hepatitis B vaccine. If you  are age 63 or older, you may get the pneumonia vaccine as a series of two separate shots. ?Ask your health care provider which other vaccines may be recommended. ?Lifestyle ?Do not use any products that contain nicotine or tobacco, such as cigarettes, e-cigarettes, and chewing tobacco. If you need help quitting, ask your health care provider. By avoiding nicotine and tobacco: ?You will lower your risk for heart attack, stroke, nerve disease, and kidney disease. ?Your cholesterol and blood pressure may improve. ?Your blood circulation will improve. ?If you drink alcohol: ?Limit how much you use to: ?0-1 drink a day for women who are not pregnant. ?0-2 drinks a day for men. ?Be aware of how much alcohol is in your drink. In the [place] one drink equals one 12 oz bottle of beer (355 mL), one 5 oz glass of wine (148 mL), or one 11?2 oz glass of hard liquor (44 mL). ?Taking care of your feet ?Diabetes may cause you to have poor blood circulation to your legs and feet. Because of this, taking care of your feet is very important. Diabetes can cause: ?The skin on the feet to get thinner, break more easily, and heal more slowly. ?Nerve damage in your legs and feet, which results in decreased feeling. You may not notice minor injuries that could lead to serious problems. ?To avoid foot problems: ?Check your skin and feet every day for cuts, bruises, redness, blisters, or sores. ?Schedule a foot exam with your health care provider once every year. This exam  includes: ?Inspecting the structure and skin of your feet. ?Checking the pulses and sensation in your feet. ?Make sure that your health care provider performs a visual foot exam at every medical visit. ? ?Taking care of your teeth ?People with poorly controlled diabetes are more likely to have gum (periodontal) disease. Diabetes can make periodontal diseases harder to control. If not treated, periodontal diseases can lead to tooth loss. To prevent this: ?Brush your teeth  twice a day. ?Floss at least once a day. ?Visit your dentist 2 times a year. ?Managing stress ?Living with diabetes can be stressful. When you are experiencing stress, your blood glucose may be affected in two ways

## 2021-07-19 NOTE — Telephone Encounter (Signed)
Pt called with questions about Lantus and what it meant by using Novolog sliding scale. ? ?I was about to answer question with office and the Rx. ? ?FYI ?

## 2021-07-19 NOTE — Chronic Care Management (AMB) (Signed)
?Chronic Care Management  ? ?CCM RN Visit Note ? ?07/19/2021 ?Name: Rebecca [name] MRN: [id number] DOB: 21-Sep-1932 ? ?Subjective: ?Rebecca [name] is a 88 y.o. year old female who is a primary care patient of Burns, [name], MD. The care management team was consulted for assistance with disease management and care coordination needs.   ? ?Engaged with patient by telephone for follow up visit in response to provider referral for case management and/or care coordination services.  ? ?Consent to Services:  ?The patient was given information about Chronic Care Management services, agreed to services, and gave verbal consent prior to initiation of services.  Please see initial visit note for detailed documentation.  ?Patient agreed to services and verbal consent obtained.  ? ?Assessment: Review of patient past medical history, allergies, medications, health status, including review of consultants reports, laboratory and other test data, was performed as part of comprehensive evaluation and provision of chronic care management services.  ?CCM Care Plan ? ?Allergies  ?Allergen Reactions  ? Codeine   ?  "Makes me drunk"  ? ?Outpatient Encounter Medications as of 07/19/2021  ?Medication Sig  ? acetaminophen (TYLENOL) 500 MG tablet Take 1,000 mg by mouth daily as needed for moderate pain.  ? Ascorbic Acid (VITAMIN C) 500 MG tablet Take 500 mg by mouth daily.  ? atorvastatin (LIPITOR) 10 MG tablet TAKE 1 TABLET BY MOUTH ONCE DAILY AT 6PM  ? blood glucose meter kit and supplies KIT Dispense based on patient and insurance preference. Use up to four times daily as directed. (FOR E11.9).  ? brimonidine (ALPHAGAN) 0.2 % ophthalmic solution Place 1 drop into the right eye 2 (two) times daily.  ? calcium carbonate (OS-CAL) 600 MG TABS Take 600 mg by mouth daily.  ? Cod Liver Oil 1000 MG CAPS Take 1,000 mg by mouth daily.   ? Coenzyme Q10 (CO Q-10) 200 MG CAPS Take 2 each by mouth daily in the afternoon.  ? DORZOLAMIDE  HCL-TIMOLOL MAL OP Place 1 drop into the right eye 2 (two) times daily.  ? Dulaglutide (TRULICITY) 3 HA/1.9FX SOPN Inject 3 mg as directed once a week.  ? ELIQUIS 5 MG TABS tablet TAKE 1 TABLET TWICE DAILY (APPOINTMENT IS NEEDED FOR MORE REFILLS)  ? gabapentin (NEURONTIN) 300 MG capsule Take 1 capsule (300 mg total) by mouth daily.  ? Glucerna (GLUCERNA) LIQD Take 237 mLs by mouth daily.  ? glucose blood (ACCU-CHEK GUIDE) test strip USE 1 STRIP TO CHECK GLUCOSE UP TO FOUR TIMES DAILY  ? hydrochlorothiazide (HYDRODIURIL) 12.5 MG tablet Take 12.5 mg by mouth daily as needed (swelling).  ? insulin aspart (NOVOLOG) 100 UNIT/ML FlexPen Patient Sig: 0-15 Units, Subcutaneous, 3 times daily with meals.  CBG < 70: Implement Hypoglycemia measures, call MD.  CBG 70 - 120: 0 units.   CBG 121 - 150: 2 units.  CBG 151 - 200: 3 units.  CBG 201 - 250: 5 units.  CBG . 251 - 300: 8 units.  CBG 301 - 350: 11 units.  CBG 351 - 400: 15 units.  CBG > 400: call MD  ? Insulin Pen Needle (BD AUTOSHIELD DUO) 30G X 5 MM MISC USE AS DIRECTED WITH INSULIN PEN NEEDLE 4 TIMES DAILY  ? Lancet Device MISC One Touch ultra 2 Delica plus lancet device. Use as directed to monitor sugars.  E11.42  ? Lancets (ONETOUCH ULTRASOFT) lancets Use as instructed  ? latanoprost (XALATAN) 0.005 % ophthalmic solution Place 1 drop into both eyes at  bedtime.  ? lisinopril (ZESTRIL) 40 MG tablet Take 1 tablet (40 mg total) by mouth daily.  ? Multiple Vitamin (MULTIVITAMIN) tablet Take 1 tablet by mouth daily. Prn  ? mupirocin ointment (BACTROBAN) 2 % Apply to right great toe once daily  ? nitroGLYCERIN (NITROSTAT) 0.4 MG SL tablet DISSOLVE ONE TABLET UNDER TONGUE AS DIRECTED FOR CHEST PAIN (Patient taking differently: Place 0.4 mg under the tongue every 5 (five) minutes as needed for chest pain.)  ? omeprazole (PRILOSEC) 20 MG capsule TAKE 1 CAPSULE EVERY DAY (STOP PANTOPRAZOLE)  ? Turmeric 500 MG CAPS Take 1 capsule by mouth daily in the afternoon.  ? ?No  facility-administered encounter medications on file as of 07/19/2021.  ? ?Patient Active Problem List  ? Diagnosis Date Noted  ? Paroxysmal A-fib ([place]) 01/12/2020  ? H/O thyroid storm 01/06/2020  ? Spinal stenosis 01/06/2020  ? Chronic kidney disease, stage 3a ([place]) 01/03/2020  ? Microcytic anemia 01/03/2020  ? Acute on chronic diastolic CHF (congestive heart failure) ([place]) 01/03/2020  ? Physical deconditioning 01/03/2020  ? Generalized osteoarthritis of hand 06/01/2019  ? Peripheral neuropathy 06/01/2019  ? Degenerative arthritis of right knee 12/15/2018  ? Knee osteoarthritis 09/08/2018  ? Pes planus 12/01/2017  ? Foot deformity, acquired, left 11/04/2017  ? Degenerative disc disease, lumbar 08/01/2016  ? Glaucoma 07/23/2016  ? Numbness 04/23/2016  ? Chronic pain of right knee 12/28/2015  ? Lower back pain 10/24/2015  ? Pain in both feet 10/24/2015  ? Abnormality of gait 04/01/2013  ? Posterior tibial tendon dysfunction 04/01/2013  ? Diabetes ([place]) 04/01/2013  ? Ankle pain, left 10/20/2010  ? UNSPECIFIED VITAMIN D DEFICIENCY 11/08/2008  ? EDEMA- LOCALIZED 11/08/2008  ? Sickle-cell trait ([place]) 01/01/2008  ? Esophageal reflux 09/02/2007  ? GOITER, MULTINODULAR 11/26/2006  ? Coronary artery disease involving native coronary artery of native heart without angina pectoris 11/26/2006  ? HIATAL HERNIA 11/26/2006  ? HYPERCHOLESTEROLEMIA 05/27/2006  ? Essential hypertension 05/27/2006  ? ?Conditions to be addressed/monitored:  CHF and DMII ? ?Care Plan : RN Care Manager Plan of Care  ?Updates made by Knox Royalty, RN since 07/19/2021 12:00 AM  ?  ? ?Problem: Chronic Disease Management Needs   ?Priority: High  ?  ? 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 ?Aging Gracefully Program through [employer] participant-- for home safety modifications/ interventions ?Has ramp outside back entry-- reports does not use front entry "at all" ?Reports has paid caregiver 3 x/ week through "[employer];" states caregiver assists with household chores primarily, occasionally assists patient with hygiene; patient reports she is not happy with her current caregiver because she brings her "infant grandchild" along with her, which makes patient uncomfortable: encouraged patient to follow up with this agency about her concerns; she  states she has been planning and will do this in the near future ?05/26/21: was made aware 05/19/21 by [facility] RN that patient reported to her that she no longer has in-home care assistance through her medicaid benefit- CSW referral referral facilitated accordingly ?Confirmed CCM CSW actively involved in patient's care needs- CCM CSW outreached patient 05/24/21, with ongoing  follow up scheduled ?07/19/21: Confirmed patient remains active with CCM CSW for in-home care needs assistance: patient reports that her family will be picking up CAPS form from PCP office, "this afternoon" ?High fall risk due to impaired mobility (uses walker) and chronic pain/ potential for hypoglycemia ?SDOH needs for transportation/ food/ financial resources: confirmed [facility] currently involved ?05/26/21: confirmed [employer] outreached patient and provided resources as requested; patient confirms she has spoken to [employer]; she denies ongoing need for resources today; states she plans to begin using her insurance benefit for transportation "soon;" encouraged her to call well in advance to arrange through insurance provider- she is aware/ reports "will do" ?Pharmacy needs/ assistance:  confirmed CCM Pharmacy team active/ involved ? ?RNCM Clinical Goal(s):  ?Patient will demonstrate ongoing health management independence as evidenced by adherence to plan of care for DMII and CHF        through collaboration w

## 2021-07-20 ENCOUNTER — Telehealth: Payer: Self-pay

## 2021-07-20 ENCOUNTER — Ambulatory Visit: Payer: Self-pay | Admitting: Licensed Clinical Social Worker

## 2021-07-20 DIAGNOSIS — I2510 Atherosclerotic heart disease of native coronary artery without angina pectoris: Secondary | ICD-10-CM

## 2021-07-20 DIAGNOSIS — I480 Paroxysmal atrial fibrillation: Secondary | ICD-10-CM

## 2021-07-20 DIAGNOSIS — Z741 Need for assistance with personal care: Secondary | ICD-10-CM

## 2021-07-20 DIAGNOSIS — E1142 Type 2 diabetes mellitus with diabetic polyneuropathy: Secondary | ICD-10-CM

## 2021-07-20 DIAGNOSIS — I5032 Chronic diastolic (congestive) heart failure: Secondary | ICD-10-CM

## 2021-07-20 NOTE — Patient Instructions (Addendum)
Visit Information ? ?Thank you for taking time to visit with me today. Please don't hesitate to contact me if I can be of assistance to you before our next scheduled telephone appointment. ? ?Following are the goals we discussed today: Assistance with ADL's ?Task & activities to accomplish goals: ?Call me if you here back from [place] on your CAP application ? ?Casimer Lanius, LCSW ?Licensed Clinical Social Worker [facility]  ?[place]  ?[phone]  ? ?Our next appointment is by telephone on July 3rd at 1:15 ? ?Please call the care guide team at [phone] if you need to cancel or reschedule your appointment.  ? ?If you are experiencing a Mental Health or [place] or need someone to talk to, please call 1-800-273-TALK (toll free, 24 hour hotline)  ? ?The patient verbalized understanding of instructions, educational materials, and care plan provided today and declined offer to receive copy of patient instructions, educational materials, and care plan.  ? ? ?

## 2021-07-20 NOTE — Telephone Encounter (Signed)
Pt is calling to get a referral to cardiology. She was seen in a while and need new referral. ? ?Dr. Marca Ancona ?Fax [phone] ?

## 2021-07-20 NOTE — Chronic Care Management (AMB) (Signed)
?Chronic Care Management  ? Clinical Social Work Note ? ?07/20/2021 ?Name: Rebecca [name] MRN: [id number] DOB: [birth year] ? ?Rebecca [name] is a 89 y.o. year old female who is a primary care patient of Burns, [name], MD. The CCM team was consulted to assist the patient with chronic disease management and/or care coordination needs related to: Level of Care Concerns.  ? ?Engaged with patient by telephone for follow up visit in response to provider referral for social work chronic care management and care coordination services.  ? ?Consent to Services:  ?The patient was given information about Chronic Care Management services, agreed to services, and gave verbal consent prior to initiation of services.  Please see initial visit note for detailed documentation.  ? ?Patient agreed to services and consent obtained.  ? ?Summary:  CAP application has been completed by PCP and faxed from office by CMA Ashely Green ?CCM LCSW collaborated with PCP and CMA  to assist with meeting patient's needs.  .  See Care Plan below for interventions and patient self-care actives. ? ?Recommendation: Patient may benefit from, and is in agreement to have son pick up copy of CAP application left at the front desk.  ? ?Follow up Plan: Patient would like continued follow-up from CCM LCSW.  per patient's request will follow up in 2 months July 3rd.  Will call office if needed prior to next encounter. ?  ?Assessment: Review of patient past medical history, allergies, medications, and health status, including review of relevant consultants reports was performed today as part of a comprehensive evaluation and provision of chronic care management and care coordination services.    ? ?SDOH (Social Determinants of Health) assessments and interventions performed:   ? ?Advanced Directives Status: Not addressed in this encounter. ? ?CCM Care Plan ?Conditions to be addressed/monitored: ; ADL IADL limitations ? ?Care Plan : LCSW Care Plan   ?Updates made by Maurine Cane, LCSW since 07/20/2021 12:00 AM  ?  ? ?Problem: Mobility and Independence with ADL's   ?  ? ?Long-Range Goal: Mobility and Independence Optimized with additional home support   ?Start Date: 05/24/2021  ?This Visit's Progress: On track  ?Recent Progress: On track  ?Priority: High  ?Note:   ?Current Barriers:  ?Financial constraints related to fixed income, medical bills and Limited social support  ?Level of Care Concerns:Inability to perform ADL's independently ?Lacks knowledge of how to connect  ? ?CSW Clinical Goal(s):  ?Patient  will  work with CAPS program and In-Home Aide program  through collaboration with [facility] representative, provider, and care team.  ? ?Interventions: ?1:1 collaboration with primary care provider regarding development and update of comprehensive plan of care as evidenced by provider attestation and co-signature ?Inter-disciplinary care team collaboration (see longitudinal plan of care) ?Evaluation of current treatment plan related to  self management and patient's adherence to plan as established by provider ?Review resources, discussed options and provided patient information about  ?Department of Social Services ( does not qualify for Medicaid ) ?Transportation provided by insurance provider (Will call insurance provider 3 days prior to appointments) ?Referral to care guide (Has been placed by CCM RN)  ?PACE Program (not interested but willing to review information ) ?Community Alternative Program  (CAP) ?Private pay options for personal care needs :(unable to pay at this time) ?[facility] in-home aide program:(called to place patient on wait list [phone]) ?Meals on wheels (currently on wait list) ? ?Level of Care Concerns in a patient with HTN and DMII:  (  Status: Goal on Track (progressing): YES.)  ?Current level of care: home, alone ?Evaluation of patient safety in current living environment ?Solution-Focused Strategies employed:  ?Active listening /  Reflection utilized  ?Problem Solving /Task Center strategies reviewed ?CAPS application completed by PCP and faxed from office by Hollace Hayward, CMA (patient's son will pick up copy from the office) ? ?Task & activities to accomplish goals: ?Call me if you here back from [place] on your CAP application ? ?  ? Casimer Lanius, LCSW ?Licensed Clinical Social Worker [facility]  ?[place]  ?[phone]  ? ?

## 2021-07-21 NOTE — Telephone Encounter (Signed)
Referral ordered

## 2021-07-27 ENCOUNTER — Ambulatory Visit: Payer: Medicare HMO

## 2021-07-27 DIAGNOSIS — M2142 Flat foot [pes planus] (acquired), left foot: Secondary | ICD-10-CM

## 2021-07-27 NOTE — Progress Notes (Signed)
SITUATION Reason for Consult: Follow-up with [facility] re-do Patient / Caregiver Report: Fit modified AFO to patient and patient was satisfied  OBJECTIVE DATA History / Diagnosis:    ICD-10-CM   1. Acquired pes planovalgus of left foot  M21.42       Change in Pathology: None  ACTIONS PERFORMED Patient's equipment was checked for structural stability and fit. Device(s) intact and fit is excellent. All questions answered and concerns addressed.  PLAN Follow-up as needed (PRN). Plan of care discussed with and agreed upon by patient / caregiver.

## 2021-07-31 ENCOUNTER — Telehealth: Payer: Medicare HMO

## 2021-08-07 ENCOUNTER — Other Ambulatory Visit: Payer: Self-pay | Admitting: Occupational Therapy

## 2021-08-07 NOTE — Patient Outreach (Signed)
Aging Gracefully Program  OT Follow-Up Visit  08/07/2021  Rebecca [name] [birth year] [id number]  Visit:  3- Third Visit  Start Time:  1000 End Time:  1055 Total Minutes:  55          Readiness to Change Score :  Readiness to Change Score: 8.33     Durable Medical Equipment: Adaptive Equipment: Other (jar openers, rocker knife) Adaptive Equipment Distribution Date: 08/07/21  Patient Education: Education Provided: Yes Education Details: Educated on use of AE Person(s) Educated: Patient Comprehension: Verbalized Understanding, Returned Demonstration  Goals:   Goals Addressed             This Visit's Progress    COMPLETED: Patient Stated       Pt will improve safety and independence in meal preparation tasks using AE as needed to compensate for hand weakness.   ACTION PLANNING - MEAL PREP  Target Problem Area: Difficulty opening jars and cutting foods  Why Problem May Occur: -bilateral hand weakness -difficulty gripping small things such as a knife handle -arthritis     Target Goal: Pt will improve safety and independence in meal preparation tasks using AE as needed to compensate for hand weakness.    STRATEGIES Saving Your Energy: DO: DON'T:  Take time to move slowly and deliberately Don't Rush  Sit on a sturdy surface to work whenever possible Stand in one spot for long periods of time  Slide items along your counter  Picking up items and carrying throughout kitchen  Modifying your home environment and making it safe: DO: DON'T:  Keep frequently used items in easy to reach places Avoid placing items on high shelves or excessively low surfaces  Use AE as needed for opening/cutting Don't strain your hands/fingers trying to open or cut items  Provide adequate lighting Use dim lights or lights that cast a lot of shadows  Simplifying the way you set up tasks or daily routines: DO: DON'T:  Use a rolling cart or caddie to transport items in the kitchen  Dont' Carry heavy bags, pots/pans, plates across the kitchen  Use electric appliances Don't Use manual force  Use lightweight items    PRACTICE It is important to practice the strategies so we can determine if they will be effective in helping to reach your goal. Follow these specific recommendations: 1.Use AE as needed for improved strength and decreased force on your hands 2.Sit to complete tasks 3.Stabilize items with one hand while using AE with other hand  If a strategy does not work the first time, try it again and again (and maybe again). We may make some changes over the next few sessions, based on how they work.              Post Clinical Reasoning: Client Action (Goal) One Interventions: Pt will improve safety and independence in meal preparation tasks using AE as needed to compensate for hand weakness. Did Client Try?: Yes Targeted Problem Area Status: A Lot Better  Clinician View Of Client Situation:: Pt is excited for her shower to be completed. OT providing AE today and educated on use. Pt was able to use jar opener to open a water bottle and simulated using her new rocker knife with improved success maintaining hold on knife and reducing stress on fingers.  Client View Of His/Her Situation:: Client reports she likes her commode seat and is ready for the shower to be completed. She explains the difficulty she has using her hands for opening items, actively  trialing her new AE and problem solving for where to place the jar opener on the bottle for the most success. Plans to practice for improved efficiency.  Next Visit Plan:: Provide DME for bed and shower, discharge pt  Ezra Sites, OTR/L  [phone] 08/07/2021

## 2021-08-09 ENCOUNTER — Encounter: Payer: Self-pay | Admitting: Internal Medicine

## 2021-08-09 ENCOUNTER — Ambulatory Visit: Payer: Medicare HMO | Admitting: Internal Medicine

## 2021-08-09 VITALS — BP 120/60 | HR 60 | Ht 63.0 in | Wt 175.4 lb

## 2021-08-09 DIAGNOSIS — R6889 Other general symptoms and signs: Secondary | ICD-10-CM | POA: Diagnosis not present

## 2021-08-09 DIAGNOSIS — I480 Paroxysmal atrial fibrillation: Secondary | ICD-10-CM | POA: Diagnosis not present

## 2021-08-09 DIAGNOSIS — H02403 Unspecified ptosis of bilateral eyelids: Secondary | ICD-10-CM | POA: Diagnosis not present

## 2021-08-09 DIAGNOSIS — H401132 Primary open-angle glaucoma, bilateral, moderate stage: Secondary | ICD-10-CM | POA: Diagnosis not present

## 2021-08-09 DIAGNOSIS — Z794 Long term (current) use of insulin: Secondary | ICD-10-CM

## 2021-08-09 DIAGNOSIS — I5030 Unspecified diastolic (congestive) heart failure: Secondary | ICD-10-CM

## 2021-08-09 DIAGNOSIS — E1159 Type 2 diabetes mellitus with other circulatory complications: Secondary | ICD-10-CM

## 2021-08-09 LAB — HM DIABETES EYE EXAM

## 2021-08-09 NOTE — Progress Notes (Signed)
Cardiology Office Note:    Date:  08/09/2021   ID:  Rebecca [name], DOB 10-10-1932, MRN [id number]  PCP:  Binnie Rail, MD   [place] [facility] Cardiologist:  Janina Mayo, MD     Referring MD: Binnie Rail, MD   No chief complaint on file. Establish Care  History of Present Illness:    Rebecca [name] is a 88 y.o. female with a hx of CAD PCI in the 1990s s/p PCI x2, DMII,  HTN sickle cell trait referral to establish care. She last saw Dr. Aundra Dubin in 2016. No changes at that time. Since then she has been diagnosed with paroxysmal afib on eliquis. She has CKD stage IIIa. 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 She was found to have afib with RVR at this time.  She subsequently had a massive PE in 02/2020 causing syncope s/p tpa. Her echo showed normal LV function, no valve dx, PA pressure 46 mmHg, RV had normal function. She was dc'ed to a SNF. She had a cardiac monitor during this time that did not show afib.  She was in the nursing facility for a good while.  Today, she notes she can feel some fast heart racing at times. No syncope. No chest pressure with activities. She denies SOB. She is in a nursing skilled facility.  No orthopnea or PND.  She mainly conducts her activities of daily living without any issues. Her family comes in to help.  Past Medical History:  Diagnosis Date   Anemia, unspecified    Ankle pain, left    Arthritis    CAD (coronary artery disease)    Esophageal reflux    Fibrocystic breast disease    Hiatal hernia    Hypercholesteremia    Hypertension    IDDM (insulin dependent diabetes mellitus)    Type II   Multinodular goiter    Sickle-cell trait ([place])     Past Surgical History:  Procedure Laterality Date   "FEMALE"  [place]   BIOPSY THYROID     Dr.Ellison-Neg    CORONARY ANGIOPLASTY     FOOT SURGERY  2004   RETINAL LASER PROCEDURE       Current Medications: Current Meds  Medication Sig   acetaminophen (TYLENOL) 500 MG tablet Take 1,000 mg by mouth daily as needed for moderate pain.   Ascorbic Acid (VITAMIN C) 500 MG tablet Take 500 mg by mouth daily.   atorvastatin (LIPITOR) 10 MG tablet TAKE 1 TABLET BY MOUTH ONCE DAILY AT 6PM   blood glucose meter kit and supplies KIT Dispense based on patient and insurance preference. Use up to four times daily as directed. (FOR E11.9).   brimonidine (ALPHAGAN) 0.2 % ophthalmic solution Place 1 drop into the right eye 2 (two) times daily.   calcium carbonate (OS-CAL) 600 MG TABS Take 600 mg by mouth daily.   Cod Liver Oil 1000 MG CAPS Take 1,000 mg by mouth daily.    Coenzyme Q10 (CO Q-10) 200 MG CAPS Take 2 each by mouth daily in the afternoon.   DORZOLAMIDE HCL-TIMOLOL MAL OP Place 1 drop into the right eye 2 (two) times daily.   Dulaglutide (TRULICITY) 3 FG/1.8EX SOPN Inject 3 mg as directed once a week.   ELIQUIS 5 MG TABS tablet TAKE 1 TABLET TWICE DAILY (APPOINTMENT IS NEEDED FOR MORE REFILLS)   gabapentin (NEURONTIN) 300 MG capsule Take  1 capsule (300 mg total) by mouth daily.   Glucerna (GLUCERNA) LIQD Take 237 mLs by mouth daily.   glucose blood (ACCU-CHEK GUIDE) test strip USE 1 STRIP TO CHECK GLUCOSE UP TO FOUR TIMES DAILY   hydrochlorothiazide (HYDRODIURIL) 12.5 MG tablet Take 12.5 mg by mouth daily as needed (swelling).   insulin aspart (NOVOLOG) 100 UNIT/ML FlexPen Patient Sig: 0-15 Units, Subcutaneous, 3 times daily with meals.  CBG < 70: Implement Hypoglycemia measures, call MD.  CBG 70 - 120: 0 units.   CBG 121 - 150: 2 units.  CBG 151 - 200: 3 units.  CBG 201 - 250: 5 units.  CBG . 251 - 300: 8 units.  CBG 301 - 350: 11 units.  CBG 351 - 400: 15 units.  CBG > 400: call MD   Insulin Pen Needle (BD AUTOSHIELD DUO) 30G X 5 MM MISC USE AS DIRECTED WITH INSULIN PEN NEEDLE 4 TIMES DAILY   Lancet Device MISC One Touch ultra 2 Delica plus lancet device. Use as directed to  monitor sugars.  E11.42   Lancets (ONETOUCH ULTRASOFT) lancets Use as instructed   latanoprost (XALATAN) 0.005 % ophthalmic solution Place 1 drop into both eyes at bedtime.   lisinopril (ZESTRIL) 40 MG tablet Take 1 tablet (40 mg total) by mouth daily.   Multiple Vitamin (MULTIVITAMIN) tablet Take 1 tablet by mouth daily. Prn   nitroGLYCERIN (NITROSTAT) 0.4 MG SL tablet DISSOLVE ONE TABLET UNDER TONGUE AS DIRECTED FOR CHEST PAIN (Patient taking differently: Place 0.4 mg under the tongue every 5 (five) minutes as needed for chest pain.)   omeprazole (PRILOSEC) 20 MG capsule TAKE 1 CAPSULE EVERY DAY (STOP PANTOPRAZOLE)   Turmeric 500 MG CAPS Take 1 capsule by mouth daily in the afternoon.     Allergies:   Codeine   Social History   Socioeconomic History   Marital status: Widowed    Spouse name: Not on file   Number of children: 1   Years of education: Not on file   Highest education level: 12th grade  Occupational History   Occupation: retired  Tobacco Use   Smoking status: Never   Smokeless tobacco: Never  Vaping Use   Vaping Use: Never used  Substance and Sexual Activity   Alcohol use: No   Drug use: No   Sexual activity: Not Currently  Other Topics Concern   Not on file  Social History Narrative   Widowed   Gets regular exercise   Supportive church network      Social Determinants of Health   Financial Resource Strain: Medium Risk   Difficulty of Paying Living Expenses: Somewhat hard  Food Insecurity: Landscape architect Present   Worried About Charity fundraiser in the Last Year: Sometimes true   Arboriculturist in the Last Year: Never true  Transportation Needs: Public librarian (Medical): Yes   Lack of Transportation (Non-Medical): Yes  Physical Activity: Insufficiently Active   Days of Exercise per Week: 7 days   Minutes of Exercise per Session: 20 min  Stress: No Stress Concern Present   Feeling of Stress : Not at all  Social  Connections: Moderately Isolated   Frequency of Communication with Friends and Family: More than three times a week   Frequency of Social Gatherings with Friends and Family: Once a week   Attends Religious Services: 1 to 4 times per year   Active Member of Clubs or Organizations: No   Attends  Club or Organization Meetings: Never   Marital Status: Widowed     Family History: The patient's family history includes Diabetes in her mother; Gout in her brother, brother, and sister; Heart attack in her father and mother; Heart disease in an other family member; Hypertension in an other family member; Lung cancer in an other family member; Thyroid disease in her brother.  ROS:   Please see the history of present illness.     All other systems reviewed and are negative.  EKGs/Labs/Other Studies Reviewed:    The following studies were reviewed today:   EKG:  EKG is  ordered today.  The ekg ordered today demonstrates   NSR, poor R wave progression  Recent Labs: 04/07/2021: Pro B Natriuretic peptide (BNP) 200.0 07/14/2021: ALT 12; BUN 31; Creatinine, Ser 1.14; Hemoglobin 10.8; Platelets 170.0; Potassium 4.6; Sodium 141; TSH 0.58   Recent Lipid Panel    Component Value Date/Time   CHOL 181 07/14/2021 1638   TRIG 84.0 07/14/2021 1638   HDL 79.50 07/14/2021 1638   CHOLHDL 2 07/14/2021 1638   VLDL 16.8 07/14/2021 1638   LDLCALC 85 07/14/2021 1638   LDLCALC 94 01/13/2021 1636     Risk Assessment/Calculations:    CHA2DS2-VASc Score = 6   This indicates a 9.7% annual risk of stroke. The patient's score is based upon: CHF History: 0 HTN History: 1 Diabetes History: 1 Stroke History: 0 Vascular Disease History: 1 Age Score: 2 Gender Score: 1          Physical Exam:    VS:  BP 120/60 (BP Location: Left Arm)   Pulse 60   Ht '5\' 3"'  (1.6 m)   Wt 175 lb 6.4 oz (79.6 kg)   SpO2 98%   BMI 31.07 kg/m     Wt Readings from Last 3 Encounters:  08/09/21 175 lb 6.4 oz (79.6 kg)   07/14/21 175 lb (79.4 kg)  03/03/21 179 lb 3.2 oz (81.3 kg)     GEN: Elderly,  Well nourished, well developed in no acute distress HEENT: Normal NECK: No JVD; No carotid bruits LYMPHATICS: No lymphadenopathy CARDIAC: RRR, no murmurs, rubs, gallops RESPIRATORY:  Clear to auscultation without rales, wheezing or rhonchi  ABDOMEN: Soft, non-tender, non-distended MUSCULOSKELETAL:  No edema; No deformity  SKIN: Warm and dry NEUROLOGIC:  Alert and oriented x 3 PSYCHIATRIC:  Normal affect   ASSESSMENT:    Ischemic Heart Disease: She has hx of ischemic heart disease from the 1990s. No CHF. Continue statin. Not on ASA, on eliquis. She has not needed to use her SL nitro.  pAF: CHADS2VASC=6.  She had a recent monitor with no recurrence. Will continue AC. Will refrain from BB for now.  HTN:  Well controlled. Continue current regimen.  PLAN:    In order of problems listed above:  Follow up in 6 months           Medication Adjustments/Labs and Tests Ordered: Current medicines are reviewed at length with the patient today.  Concerns regarding medicines are outlined above.  Orders Placed This Encounter  Procedures   EKG 12-Lead   No orders of the defined types were placed in this encounter.   Patient Instructions  Medication Instructions:  No changes *If you need a refill on your cardiac medications before your next appointment, please call your pharmacy*   Lab Work: None ordered If you have labs (blood work) drawn today and your tests are completely normal, you will receive your results only by: MyChart  Message (if you have MyChart) OR A paper copy in the mail If you have any lab test that is abnormal or we need to change your treatment, we will call you to review the results.   Testing/Procedures: None ordered   Follow-Up: At [facility], you and your health needs are our priority.  As part of our continuing mission to provide you with exceptional heart care, we  have created designated Provider Care Teams.  These Care Teams include your primary Cardiologist (physician) and Advanced Practice Providers (APPs -  Physician Assistants and Nurse Practitioners) who all work together to provide you with the care you need, when you need it.  We recommend signing up for the patient portal called "MyChart".  Sign up information is provided on this After Visit Summary.  MyChart is used to connect with patients for Virtual Visits (Telemedicine).  Patients are able to view lab/test results, encounter notes, upcoming appointments, etc.  Non-urgent messages can be sent to your provider as well.   To learn more about what you can do with MyChart, go to [email or web address].    Your next appointment:   6 month(s)  The format for your next appointment:   In Person  Provider:   Dr. Harl Bowie  Important Information About Sugar         Signed, Janina Mayo, MD  08/09/2021 2:54 PM    [place]

## 2021-08-09 NOTE — Patient Instructions (Signed)
Medication Instructions:  No changes *If you need a refill on your cardiac medications before your next appointment, please call your pharmacy*   Lab Work: None ordered If you have labs (blood work) drawn today and your tests are completely normal, you will receive your results only by: [place] (if you have MyChart) OR A paper copy in the mail If you have any lab test that is abnormal or we need to change your treatment, we will call you to review the results.   Testing/Procedures: None ordered   Follow-Up: At [facility], you and your health needs are our priority.  As part of our continuing mission to provide you with exceptional heart care, we have created designated Provider Care Teams.  These Care Teams include your primary Cardiologist (physician) and Advanced Practice Providers (APPs -  Physician Assistants and Nurse Practitioners) who all work together to provide you with the care you need, when you need it.  We recommend signing up for the patient portal called "MyChart".  Sign up information is provided on this After Visit Summary.  MyChart is used to connect with patients for Virtual Visits (Telemedicine).  Patients are able to view lab/test results, encounter notes, upcoming appointments, etc.  Non-urgent messages can be sent to your provider as well.   To learn more about what you can do with MyChart, go to [email or web address].    Your next appointment:   6 month(s)  The format for your next appointment:   In Person  Provider:   Dr. Harl Bowie  Important Information About Sugar

## 2021-08-10 ENCOUNTER — Telehealth: Payer: Self-pay | Admitting: Internal Medicine

## 2021-08-10 NOTE — Telephone Encounter (Signed)
PT calls today in regards to their recent visit to [facility]. They had seen Arnoldo Hooker and was informed that she needed to have blood work done over here. I had let her know we had received the findings from her but I didn't see in there that she would need blood work done, or what kind of blood work she needed done. Those findings are currently in the e-fax for Dr.Burns.   Would we want to get some orders in for labs? Would I need to try and reach out to [facility]?

## 2021-08-10 NOTE — Telephone Encounter (Signed)
She does have blood work done 1 month ago.  We should get more information before we order blood work.  Ideally her sugars need to be better controlled-this is probably what they are seen.  Find out how the sugars are at home.

## 2021-08-11 ENCOUNTER — Other Ambulatory Visit: Payer: Self-pay | Admitting: Internal Medicine

## 2021-08-11 ENCOUNTER — Other Ambulatory Visit: Payer: Medicare HMO

## 2021-08-11 DIAGNOSIS — H538 Other visual disturbances: Secondary | ICD-10-CM

## 2021-08-11 DIAGNOSIS — H547 Unspecified visual loss: Secondary | ICD-10-CM | POA: Insufficient documentation

## 2021-08-11 NOTE — Telephone Encounter (Signed)
Dr. Ronnald Ramp was able to place lab orders for patient.  Will send to Dr. Richard Miu office when we get them back.

## 2021-08-11 NOTE — Telephone Encounter (Signed)
Printed out and placed in fax folder

## 2021-08-14 ENCOUNTER — Telehealth: Payer: Medicare HMO

## 2021-08-18 ENCOUNTER — Other Ambulatory Visit: Payer: Self-pay

## 2021-08-18 NOTE — Patient Outreach (Signed)
Aging Gracefully Program  RN Visit  08/18/2021  Soila Printup [facility] 1932-12-22 [id number]  Visit:     Start Time:  0940  End Time:   10:20 Total Minutes:   40 minutes  Readiness To Change Score:  Readiness to Change Score: 10  Universal RN Interventions: Calendar Distribution: Yes (reports she continues to use her calendar) Exercise Review: Yes (reports does her excercises daily from booklet and will walk up and down ramp. states the arm stretch exercise has helped improved ROM) Medications: Yes (medications reviewed. reports takes medications as prescribed) Medication Changes: Yes Mood: Yes (states, "I stay in a pretty good mood") Pain: No (denies pain) PCP Advocacy/Support: No Fall Prevention: No (denies any falls since last home visit) Incontinence: No (denies incontinence.) Clinician View Of Client Situation: Client very pleasant in good spirits. Sitting in chair at kitchen table. Rollator walker in front of her. Client View Of His/Her Situation: Client reports she feels like she is doing better. She states she is managing blood sugar well. Blood sugar this morning was 102. She continues to excercise as daily.  Healthcare Provider Communication: Did Surveyor, mining With [employer] Provider?: No According to Client, Did PCP Report Communication With An Aging Gracefully RN?: No  Clinician View of Client Situation: Clinician View Of Client Situation: Client very pleasant in good spirits. Sitting in chair at kitchen table. Rollator walker in front of her. Client's View of His/Her Situation: Client View Of His/Her Situation: Client reports she feels like she is doing better. She states she is managing blood sugar well. Blood sugar this morning was 102. She continues to excercise as daily.  Medication Assessment: very knowledgeable about her medications. Denies any medication management needs.   OT Update: n/a  Session Summary: Medications reviewed with client; discussed  fall precaution. Encouraged to find alert device or get some other type fall alert system. and diabetes management. She continues to exercise and contact providers as needed. She reports a supportive family. She expressed her goals to do what she needs to do to continue to stay in her home.  Plan: Final Visit. Client encouraged to contact primary care provider going forward for any questions or concerns.  Kathyrn Sheriff, RN, MSN, BSN, CCM Aging Gracefully Care Management Coordinator [phone]

## 2021-08-18 NOTE — Patient Instructions (Signed)
Goals Addressed               This Visit's Progress     Patient Stated (pt-stated)          Aging Gracefully RN Goals:  Goal: Patient will report better understanding of how to use her lancet device in the next 120 days. Goal met    08/18/21 Assessment: Patient reports she is able to use Lancet device and checks blood sugar twice a day, although prescribed three times/day with meals. Ms. Mcgrory states she is aware, but is only going to check her blood sugar twice a day. She reports she can tell if her blood sugar is too high or too low. Blood sugar today 102.  Interventions:  Encouraged to continue to check blood sugars as prescribed. Reviewed medications and encouraged to take medications as prescribed Update embedded nurse case manager  Plan: Final Visit: Patient instructed to contact primary care provider going forward with any questions or concerns.  Thea Silversmith, RN, MSN, BSN, CCM Aging Gracefully Care Management Coordinator [phone]        Patient Stated        Aging Gracefully RN Goal:   Goal: Patient will report talking with social worker and getting help with transportation problems in the next 60 days. Goal met  08/18/21 Goal Met: Client reports she spoke with social worker and has transportation to her appointments. She also reports her daughter in law also will be taking her to appointments.  Plan: Final Visit: Patient instructed to contact primary care provider going forward with any questions or concerns.  Thea Silversmith, RN, MSN, BSN, CCM Aging Gracefully Care Management Coordinator [phone]         Patient Stated        Aging Gracefully RN  Goal: Patient will report no falls in the next 120 days. Goal met  08/18/21 Assessment: Client denies any falls.  Interventions: Encouraged to maintain fall prevention strategies Discussed life alert system and/or watch that can detect  falls,  Encouraged to continue to use assistive device with  ambulation Encouraged to continue her exercise program and remain active.  Plan: Final Visit: Patient instructed to contact primary care provider going forward with any questions or concerns.

## 2021-08-21 LAB — STRIATED MUSCLE ANTIBODY: STRIATED MUSCLE AB SCREEN: NEGATIVE

## 2021-08-21 LAB — ACETYLCHOLINE RECEPTOR, BINDING: A CHR BINDING ABS: <0.3 nmol/L

## 2021-08-22 ENCOUNTER — Ambulatory Visit: Payer: Medicare HMO | Admitting: Internal Medicine

## 2021-08-29 ENCOUNTER — Encounter: Payer: Self-pay | Admitting: Podiatry

## 2021-08-29 ENCOUNTER — Ambulatory Visit: Payer: Medicare HMO | Admitting: Podiatry

## 2021-08-29 DIAGNOSIS — M79675 Pain in left toe(s): Secondary | ICD-10-CM

## 2021-08-29 DIAGNOSIS — L84 Corns and callosities: Secondary | ICD-10-CM | POA: Diagnosis not present

## 2021-08-29 DIAGNOSIS — M79674 Pain in right toe(s): Secondary | ICD-10-CM

## 2021-08-29 DIAGNOSIS — E1142 Type 2 diabetes mellitus with diabetic polyneuropathy: Secondary | ICD-10-CM | POA: Diagnosis not present

## 2021-08-29 DIAGNOSIS — B351 Tinea unguium: Secondary | ICD-10-CM | POA: Diagnosis not present

## 2021-08-29 NOTE — Patient Instructions (Signed)
Choose a moisturizer from  the list below:  For normal skin: Moisturize feet once daily; do not apply between toes A.  CeraVe Daily Moisturizing Lotion B.  Lubriderm Advanced Therapy Lotion or Lubriderm Intense Skin Repair Lotion C.  Vaseline Intensive Care Lotion D.  Gold Bond Ultimate Diabetic Foot Lotion E.  Eucerin Intensive Repair Moisturizing Lotion  For extremely dry, cracked feet: moisturize feet once daily; do not apply between toes A. CeraVe Healing Ointment B. Eucerin Aquaphor Repairing Ointment (may be labeled Aquaphor Healing Ointment) C. Vaseline Petroleum Healing Jelly

## 2021-08-31 DIAGNOSIS — H401132 Primary open-angle glaucoma, bilateral, moderate stage: Secondary | ICD-10-CM | POA: Diagnosis not present

## 2021-09-04 ENCOUNTER — Ambulatory Visit (INDEPENDENT_AMBULATORY_CARE_PROVIDER_SITE_OTHER): Payer: Medicare HMO | Admitting: Internal Medicine

## 2021-09-04 ENCOUNTER — Encounter: Payer: Self-pay | Admitting: Internal Medicine

## 2021-09-04 VITALS — BP 142/72 | HR 50 | Temp 98.1°F | Ht 63.0 in | Wt 179.0 lb

## 2021-09-04 DIAGNOSIS — Z794 Long term (current) use of insulin: Secondary | ICD-10-CM | POA: Diagnosis not present

## 2021-09-04 DIAGNOSIS — E1142 Type 2 diabetes mellitus with diabetic polyneuropathy: Secondary | ICD-10-CM

## 2021-09-04 DIAGNOSIS — H02401 Unspecified ptosis of right eyelid: Secondary | ICD-10-CM | POA: Diagnosis not present

## 2021-09-04 DIAGNOSIS — E042 Nontoxic multinodular goiter: Secondary | ICD-10-CM

## 2021-09-04 DIAGNOSIS — Z8639 Personal history of other endocrine, nutritional and metabolic disease: Secondary | ICD-10-CM

## 2021-09-04 DIAGNOSIS — I10 Essential (primary) hypertension: Secondary | ICD-10-CM | POA: Diagnosis not present

## 2021-09-04 DIAGNOSIS — R6889 Other general symptoms and signs: Secondary | ICD-10-CM | POA: Diagnosis not present

## 2021-09-04 LAB — T4, FREE: Free T4: 0.85 ng/dL (ref 0.60–1.60)

## 2021-09-04 LAB — TSH: TSH: 1.03 u[IU]/mL (ref 0.35–5.50)

## 2021-09-04 LAB — BASIC METABOLIC PANEL
BUN: 25 mg/dL — ABNORMAL HIGH (ref 6–23)
CO2: 28 mEq/L (ref 19–32)
Calcium: 9.4 mg/dL (ref 8.4–10.5)
Chloride: 107 mEq/L (ref 96–112)
Creatinine, Ser: 1.09 mg/dL (ref 0.40–1.20)
GFR: 45.77 mL/min — ABNORMAL LOW (ref >60.00)
Glucose, Bld: 102 mg/dL — ABNORMAL HIGH (ref 70–99)
Potassium: 4.2 mEq/L (ref 3.5–5.1)
Sodium: 141 mEq/L (ref 135–145)

## 2021-09-04 LAB — T3, FREE: T3, Free: 2.3 pg/mL (ref 2.3–4.2)

## 2021-09-04 LAB — HEMOGLOBIN A1C: Hgb A1c MFr Bld: 8.1 % — ABNORMAL HIGH (ref 4.6–6.5)

## 2021-09-04 NOTE — Progress Notes (Signed)
  Subjective:  Patient ID: Rebecca [name], female    DOB: 1932-04-07,  MRN: [id number]  Rebecca [name] presents to clinic today for at risk foot care. Pt has h/o NIDDM with PAD  Patient states blood glucose was 99 mg/dl today.  Last A1c was 7%.  New problem(s): None.   PCP is Pincus Sanes, MD , and last visit was Jul 14, 2021.  Allergies  Allergen Reactions   Codeine     "Makes me drunk"    Review of Systems: Negative except as noted in the HPI.  Objective: Vascular Examination: CFT <4 seconds b/l LE. Faintly palpable DP pulses b/l LE. Faintly palpable PT pulse(s) b/l LE. Pedal hair absent. No pain with calf compression b/l. +2 pitting edema BLE. No ischemia or gangrene noted b/l LE. No cyanosis or clubbing noted b/l LE.  Dermatological Examination: Pedal skin is warm and supple b/l LE. No open wounds b/l LE. No interdigital macerations noted b/l LE. Toenails 1-5 left, R hallux, R 3rd toe, R 4th toe, and R 5th toe elongated, discolored, dystrophic, thickened, and crumbly with subungual debris and tenderness to dorsal palpation. Incurvated nailplate b/l lower extremities with tenderness to palpation. No erythema, no edema, no drainage noted. No fluctuance.  Musculoskeletal Examination: Muscle strength 5/5 to all lower extremity muscle groups bilaterally. Severely collapsed flatfoot of the left lower extremity. Wearing appropriate fitting shoe gear. Utilizes rollator for ambulation assistance.  Neurological Examination: Pt has subjective symptoms of neuropathy. Protective sensation intact 5/5 intact bilaterally with 10g monofilament b/l. Vibratory sensation diminished b/l.     Latest Ref Rng & Units 07/14/2021    4:38 PM 01/13/2021    4:36 PM  Hemoglobin A1C  Hemoglobin-A1c 4.6 - 6.5 % 8.0  5.3    Assessment/Plan: No diagnosis found.   -Patient was evaluated and treated. All patient's and/or POA's questions/concerns answered on today's visit. -Patient to continue soft,  supportive shoe gear daily. -Mycotic toenails 2-5 bilaterally were debrided in length and girth with sterile nail nippers and dremel without iatrogenic bleeding. -Callus(es) bilateral great toes pared utilizing sterile scalpel blade without complication or incident. Total number debrided =-Patient given list of recommended sho gear.. -Patient/POA to call should there be question/concern in the interim.   Return in about 3 months (around 11/29/2021).  Freddie Breech, DPM

## 2021-09-06 ENCOUNTER — Other Ambulatory Visit: Payer: Self-pay | Admitting: Internal Medicine

## 2021-09-06 ENCOUNTER — Ambulatory Visit (INDEPENDENT_AMBULATORY_CARE_PROVIDER_SITE_OTHER): Payer: Medicare HMO | Admitting: *Deleted

## 2021-09-06 DIAGNOSIS — E1142 Type 2 diabetes mellitus with diabetic polyneuropathy: Secondary | ICD-10-CM

## 2021-09-06 DIAGNOSIS — I5032 Chronic diastolic (congestive) heart failure: Secondary | ICD-10-CM

## 2021-09-07 NOTE — Chronic Care Management (AMB) (Signed)
Chronic Care Management   CCM RN Visit Note  09/07/2021 Name: DEMAYA [name] MRN: [id number] DOB: [birth year]  Subjective: Rebecca [name] is a 88 y.o. year old female who is a primary care patient of Burns, [name], MD. The care management team was consulted for assistance with disease management and care coordination needs.    Engaged with patient by telephone for follow up visit/ CCM RN CM case closure in response to provider referral for case management and/or care coordination services.   Consent to Services:  The patient was given information about Chronic Care Management services, agreed to services, and gave verbal consent prior to initiation of services.  Please see initial visit note for detailed documentation.  Patient agreed to services and verbal consent obtained.   Assessment: Review of patient past medical history, allergies, medications, health status, including review of consultants reports, laboratory and other test data, was performed as part of comprehensive evaluation and provision of chronic care management services.   SDOH (Social Determinants of Health) assessments and interventions performed:  SDOH Interventions    Flowsheet Row Most Recent Value  SDOH Interventions   Food Insecurity Interventions Intervention Not Indicated  [confirms she has been placed on MOW waiting list and has been provided resources for food aquisition,  denies need for additional resources today, states, "I am doing okay"]  Housing Interventions Intervention Not Indicated  [continues residing alone in single family home,  son lives next door and checks in frequently]  Transportation Interventions Intervention Not Indicated  [reports today using transportation benefit through insurance program]     CCM Care Plan  Allergies  Allergen Reactions   Codeine     "Makes me drunk"   Outpatient Encounter Medications as of 09/06/2021  Medication Sig   acetaminophen (TYLENOL) 500 MG  tablet Take 1,000 mg by mouth daily as needed for moderate pain.   Ascorbic Acid (VITAMIN C) 500 MG tablet Take 500 mg by mouth daily.   blood glucose meter kit and supplies KIT Dispense based on patient and insurance preference. Use up to four times daily as directed. (FOR E11.9).   brimonidine (ALPHAGAN) 0.2 % ophthalmic solution Place 1 drop into the right eye 2 (two) times daily.   calcium carbonate (OS-CAL) 600 MG TABS Take 600 mg by mouth daily.   Cod Liver Oil 1000 MG CAPS Take 1,000 mg by mouth daily.    Coenzyme Q10 (CO Q-10) 200 MG CAPS Take 2 each by mouth daily in the afternoon.   DORZOLAMIDE HCL-TIMOLOL MAL OP Place 1 drop into the right eye 2 (two) times daily.   Dulaglutide (TRULICITY) 3 XJ/1.5ZM SOPN Inject 3 mg as directed once a week.   ELIQUIS 5 MG TABS tablet TAKE 1 TABLET TWICE DAILY (APPOINTMENT IS NEEDED FOR MORE REFILLS)   gabapentin (NEURONTIN) 300 MG capsule Take 1 capsule (300 mg total) by mouth daily.   Glucerna (GLUCERNA) LIQD Take 237 mLs by mouth daily.   glucose blood (ACCU-CHEK GUIDE) test strip USE 1 STRIP TO CHECK GLUCOSE UP TO FOUR TIMES DAILY   hydrochlorothiazide (HYDRODIURIL) 12.5 MG tablet Take 12.5 mg by mouth daily as needed (swelling).   insulin aspart (NOVOLOG) 100 UNIT/ML FlexPen Patient Sig: 0-15 Units, Subcutaneous, 3 times daily with meals.  CBG < 70: Implement Hypoglycemia measures, call MD.  CBG 70 - 120: 0 units.   CBG 121 - 150: 2 units.  CBG 151 - 200: 3 units.  CBG 201 - 250: 5 units.  CBG .  251 - 300: 8 units.  CBG 301 - 350: 11 units.  CBG 351 - 400: 15 units.  CBG > 400: call MD   Insulin Pen Needle (BD AUTOSHIELD DUO) 30G X 5 MM MISC USE AS DIRECTED WITH INSULIN PEN NEEDLE 4 TIMES DAILY   Lancet Device MISC One Touch ultra 2 Delica plus lancet device. Use as directed to monitor sugars.  E11.42   Lancets (ONETOUCH ULTRASOFT) lancets Use as instructed   latanoprost (XALATAN) 0.005 % ophthalmic solution Place 1 drop into both eyes at bedtime.    lisinopril (ZESTRIL) 40 MG tablet Take 1 tablet (40 mg total) by mouth daily.   Multiple Vitamin (MULTIVITAMIN) tablet Take 1 tablet by mouth daily. Prn   nitroGLYCERIN (NITROSTAT) 0.4 MG SL tablet DISSOLVE ONE TABLET UNDER TONGUE AS DIRECTED FOR CHEST PAIN (Patient taking differently: Place 0.4 mg under the tongue every 5 (five) minutes as needed for chest pain.)   omeprazole (PRILOSEC) 20 MG capsule TAKE 1 CAPSULE EVERY DAY (STOP PANTOPRAZOLE)   Turmeric 500 MG CAPS Take 1 capsule by mouth daily in the afternoon.   [DISCONTINUED] atorvastatin (LIPITOR) 10 MG tablet TAKE 1 TABLET BY MOUTH ONCE DAILY AT 6PM   No facility-administered encounter medications on file as of 09/06/2021.   Patient Active Problem List   Diagnosis Date Noted   Acquired involutional ptosis of eyelid, right 09/04/2021   Blurred vision, bilateral 08/11/2021   Paroxysmal A-fib ([place]) 01/12/2020   H/O thyroid storm 01/06/2020   Spinal stenosis 01/06/2020   Chronic kidney disease, stage 3a ([place]) 01/03/2020   Microcytic anemia 01/03/2020   Acute on chronic diastolic CHF (congestive heart failure) ([place]) 01/03/2020   Physical deconditioning 01/03/2020   Generalized osteoarthritis of hand 06/01/2019   Peripheral neuropathy 06/01/2019   Degenerative arthritis of right knee 12/15/2018   Knee osteoarthritis 09/08/2018   Pes planus 12/01/2017   Foot deformity, acquired, left 11/04/2017   Degenerative disc disease, lumbar 08/01/2016   Glaucoma 07/23/2016   Numbness 04/23/2016   Chronic pain of right knee 12/28/2015   Lower back pain 10/24/2015   Pain in both feet 10/24/2015   Abnormality of gait 04/01/2013   Posterior tibial tendon dysfunction 04/01/2013   Diabetes ([place]) 04/01/2013   Ankle pain, left 10/20/2010   UNSPECIFIED VITAMIN D DEFICIENCY 11/08/2008   EDEMA- LOCALIZED 11/08/2008   Sickle-cell trait ([place]) 01/01/2008   Esophageal reflux 09/02/2007   GOITER, MULTINODULAR 11/26/2006   Coronary artery disease  involving native coronary artery of native heart without angina pectoris 11/26/2006   HIATAL HERNIA 11/26/2006   HYPERCHOLESTEROLEMIA 05/27/2006   Essential hypertension 05/27/2006   Conditions to be addressed/monitored:  CHF and DMII  Care Plan : RN Care Manager Plan of Care  Updates made by Knox Royalty, RN since 09/07/2021 12:00 AM     Problem: Chronic Disease Management Needs   Priority: High     Long-Range Goal: Ongoing adherence to established plan of care for long term chronic disease management   Start Date: 04/21/2021  Expected End Date: 04/21/2022  Priority: High  Note:   Current Barriers:  Chronic Disease Management support and education needs related to CHF and DMII Lives alone in single family one level home; adult son lives next door and assists 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, 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-- for home safety modifications/ interventions Has ramp outside back entry-- reports does not use front entry "at all" Reports has paid caregiver 3 x/ week  through "[employer];" states caregiver assists with household chores primarily, occasionally assists patient with hygiene; patient reports she is not happy with her current caregiver because she brings her "infant grandchild" along with her, which makes patient uncomfortable: encouraged patient to follow up with this agency about her concerns; she  states she has been planning and will do this in the near future 05/26/21: was made aware 05/19/21 by [facility] RN that patient reported to her that she no longer has in-home care assistance through her medicaid benefit- CSW referral referral facilitated accordingly Confirmed CCM CSW actively involved in patient's care needs- CCM CSW outreached patient 05/24/21, with ongoing follow up scheduled 07/19/21: Confirmed patient remains active with CCM CSW for in-home care needs assistance: patient reports that her  family will be picking up CAPS form from PCP office, "this afternoon" 09/06/21: confirmed patient continues to wait on follow up from CAPS application- has been told she has been approved; I assured patient that CCM CSW will be in touch by telephone as per scheduled visit 09/11/21; care coordination outreach placed with CCM CSW: she confirmed that she has follow up on CAPS status and will be updating patient as scheduled 09/11/21; made CCM CSW aware that I have closed CCM RN CM case today High fall risk due to impaired mobility (uses walker) and chronic pain/ potential for hypoglycemia SDOH needs for transportation/ food/ financial resources: confirmed [facility] currently involved 05/26/21: confirmed [employer] patient and provided resources as requested; patient confirms she has spoken to [employer]; she denies ongoing need for resources today; states she plans to begin using her insurance benefit for transportation "soon;" encouraged her to call well in advance to arrange through insurance provider- she is aware/ reports "will do" 09/06/21: patient confirms she is now using transportation benefit through her insurance provider; confirms she is on the wait list for MOW; today, she denies ongoing/ unmet transportation/ food insecurity needs Pharmacy needs/ assistance: confirmed CCM Pharmacy team active/ involved  RNCM Clinical Goal(s):  Patient will demonstrate ongoing health management independence as evidenced by adherence to plan of care for DMII and CHF        through collaboration with RN Care manager, provider, and care team.   Interventions: 1:1 collaboration with primary care provider regarding development and update of comprehensive plan of care as evidenced by provider attestation and co-signature Inter-disciplinary care team collaboration (see longitudinal plan of care) Evaluation of current treatment plan related to  self  management and patient's adherence to plan as established by provider 05/01/21- CCM RN CM initial assessment completed Review of patient status, including review of consultants reports, relevant laboratory and other test results, and medications completed SDOH updated: no new/ unmet concerns identified Pain assessment updated: denies pain today Falls assessment updated: she continues to deny new/ recent falls x 12 months- continues using walker "all the time;"  positive reinforcement provided with encouragement to continue efforts at fall prevention; previously provided education around fall risks/ prevention reinforced Medications discussed: reports continues to independently self-manage and denies current concerns/ issues/ questions around medications; endorses adherence to taking all medications as prescribed Reviewed recent provider office visits: 08/09/21- cardiology; 09/04/21- PCP; patient confirms no changes made to medications and verbalizes good understanding of post-visit instructions; denies post-office visit questions  Reviewed upcoming scheduled provider appointments: 09/11/21- CCM CSW telephone visit; 11/29/21- Podiatry; 01/19/22- PCP; patient confirms is aware of all and has plans to attend as scheduled Discussed plans  with patient for ongoing care management follow up- patient denies current care coordination/ care management needs and is agreeable to CCM RN CM case closure today; verbalizes understanding to contact PCP or other care providers for any needs that arise in the future, and confirms she has contact information for all care providers     Heart Failure Interventions:  (Status: 09/06/21: Goal Met.)  Long Term Goal  Wt Readings from Last 3 Encounters:  03/03/21 179 lb 3.2 oz (81.3 kg)  01/13/21 183 lb (83 kg)  11/16/20 184 lb 12.8 oz (83.8 kg)  Basic overview and discussion of pathophysiology of Heart Failure reviewed Provided education on low sodium diet Discussed the importance  of keeping all appointments with provider Confirmed patient remains unable to perform daily weights at home: does not have scale; reports unable to safely perform daily weights anyway, due to being fall risk Reinforced previously provided education around signs/ symptoms CHF yellow zone along with corresponding action plan for same: patient denies signs/ symptoms today, sounds to be in no distress throughout our phone call today; additionally she denies lower extremity/ abdominal swelling, shortness of breath outside of baseline; we discussed importance of daily assessment around breathing/ swelling status- she verbalizes good understanding of same Confirmed that patient follows "healthy diet;" and "does not ever eat added salt;" confirms she continues to do most of her own cooking  Diabetes:  (Status: 09/06/21: Goal Met.) Long Term Goal   Lab Results  Component Value Date   HGBA1C 8.1 (H) 09/04/2021    Provided education to patient about basic DM disease process; Reviewed prescribed diet with patient heart healthy, low salt, low cholesterol, carbohydrate modified/ low sugar; Counseled on importance of regular laboratory monitoring as prescribed;        Confirmed patient has been monitoring and continues to monitor blood sugars at home BID: fasting and before last meal of day Reviewed fasting blood sugars: she reports consistent fasting values between 90-140  Reviewed evening blood sugars: she reports consistent values between 100-150; with occasional high readings > 180; she states this happens "every so often," but "not regularly" Confirmed no recent episodes hypoglycemia: reinforced previously provided education around signs/ symptoms hypoglycemia; corresponding action plan for same reinforced Reinforced PCP goal  for patient to keep blood sugars at home > 100 Assessed patient's understanding of meaning/ significance of A1-C values: ongoing good baseline understanding of same- Reviewed  individual historical A1-C trends and provided education around correlation of A1-C value to blood sugar levels at home over 3 months; patient confirms that she has not received A1-C target range from PCP; we discussed that her most recent A1-C is very close to previous values; encouraged her ongoing discussion around blood sugar goals with PCP Confirmed patient no longer taking Lantus insulin- states "Dr. Quay Burow told me to stop it and it has helped to keep my blood sugars from dropping too low"      Plan: No further follow up required: patient denies current care coordination/ care management needs and is agreeable to CCM RN CM case closure today; CCM RN CM case closure accordingly     [name], RN, BSN, [place] [phone]: direct office

## 2021-09-08 DIAGNOSIS — Z794 Long term (current) use of insulin: Secondary | ICD-10-CM | POA: Diagnosis not present

## 2021-09-08 DIAGNOSIS — I509 Heart failure, unspecified: Secondary | ICD-10-CM

## 2021-09-08 DIAGNOSIS — E1159 Type 2 diabetes mellitus with other circulatory complications: Secondary | ICD-10-CM

## 2021-09-11 ENCOUNTER — Ambulatory Visit (INDEPENDENT_AMBULATORY_CARE_PROVIDER_SITE_OTHER): Payer: Medicare HMO | Admitting: Licensed Clinical Social Worker

## 2021-09-11 ENCOUNTER — Encounter: Payer: Self-pay | Admitting: Licensed Clinical Social Worker

## 2021-09-11 DIAGNOSIS — Z741 Need for assistance with personal care: Secondary | ICD-10-CM

## 2021-09-11 DIAGNOSIS — I5033 Acute on chronic diastolic (congestive) heart failure: Secondary | ICD-10-CM

## 2021-09-11 DIAGNOSIS — E1142 Type 2 diabetes mellitus with diabetic polyneuropathy: Secondary | ICD-10-CM

## 2021-09-11 DIAGNOSIS — Z794 Long term (current) use of insulin: Secondary | ICD-10-CM

## 2021-09-11 NOTE — Patient Instructions (Signed)
Visit Information  Congratulations on achieving your goals! It was a pleasure working with you, and I hope you continue to make great strides in improving your health. No Follow up Scheduled:  You do not require continued follow-up by care coordination team Per our conversation, I will disconnect from your care team at this time Please contact the office if needed  Following are the goals we discussed today: Care in your home Task & activities: Call the CAPS program for your assessment if no one has called you in two weeks [phone]  You are on the wait list for DSS in-home aide program call  [phone] to check if you are not approved for CAPS       Sammuel Hines, LCSW Licensed Clinical Social Worker [name]  [facility]  [phone]    The patient verbalized understanding of instructions, educational materials, and care plan provided today and agreed to receive a mailed copy of patient instructions, educational materials, and care plan.

## 2021-09-11 NOTE — Chronic Care Management (AMB) (Signed)
Chronic Care Management   Clinical Social Work Note  09/11/2021 Name: Rebecca [name] MRN: [id number] DOB: 12/02/1932  Rebecca [name] is a 88 y.o. year old female who is a primary care patient of Burns, [name], MD. The CCM team was consulted to assist the patient with chronic disease management and/or care coordination needs related to: Level of Care Concerns.   Engaged with patient by telephone for follow up visit in response to provider referral for social work chronic care management and care coordination services.   Consent to Services:  The patient was given information about Chronic Care Management services, agreed to services, and gave verbal consent prior to initiation of services.  Please see initial visit note for detailed documentation.   Patient agreed to services and consent obtained.   Summary:  Patient is making progress with care plan goals, per CAPS coordinator someone should be contacting patient in two weeks for the aide assessment.  Patient reports being excited about the renovation of her bathroom with the [facility] program. No other needs identified during this encounter  .  See Care Plan below for interventions and patient self-care actives.  Recommendation: Patient may benefit from, and is in agreement to follow up with CAPS in two weeks if no one has called her.   No Follow up Scheduled:  All care plan goals have been met. Will disconnect from care team after this encounter. Patient has been informed to contact the office if new needs arise.   Assessment: Review of patient past medical history, allergies, medications, and health status, including review of relevant consultants reports was performed today as part of a comprehensive evaluation and provision of chronic care management and care coordination services.     SDOH (Social Determinants of Health) assessments and interventions performed:    Advanced Directives Status: Not addressed in this  encounter.  CCM Care Plan Conditions to be addressed/monitored: ; Level of care concerns  Care Plan : LCSW Care Plan  Updates made by Maurine Cane, LCSW since 09/11/2021 12:00 AM     Problem: Mobility and Independence with ADL's      Long-Range Goal: Mobility and Independence Optimized with additional home support Completed 09/11/2021  Start Date: 05/24/2021  This Visit's Progress: On track  Recent Progress: On track  Priority: High  Note:   Current Barriers:  Financial constraints related to fixed income, medical bills and Limited social support  Level of Care Concerns:Inability to perform ADL's independently Lacks knowledge of how to connect   [place]):  Patient  will  work with CAPS program and In-Home Aide program  through collaboration with [facility] representative, provider, and care team.   Interventions: 1:1 collaboration with primary care provider regarding development and update of comprehensive plan of care as evidenced by provider attestation and co-signature Inter-disciplinary care team collaboration (see longitudinal plan of care) Evaluation of current treatment plan related to  self management and patient's adherence to plan as established by provider Review resources, discussed options and provided patient information about  Community Alternative Program  (CAP) Private pay options for personal care needs :(unable to pay at this time) DSS in-home aide program:(called to place patient on wait list [phone]) Meals on wheels (currently on wait list)  Level of Care Concerns in a patient with HTN and DMII:  (Status: Goal on Track (progressing): YES.)  Current level of care: home, alone Evaluation of patient safety in current living environment Solution-Focused Strategies employed:  Active listening /  Reflection utilized  Problem Solving /Task Center strategies reviewed Collaborated with CAPS program coordinator informed patient's case manager will be  contacting her in a few weeks to schedule assessment  Task & activities: Call the [place] program for your assessment if no one has called you in two weeks [phone] You are on the wait list for DSS in-home aide program call  [phone] to check if you are not approved for CAPS     Casimer Lanius, LCSW Licensed Clinical Social Worker [facility]  [place]  [phone]

## 2021-09-14 ENCOUNTER — Telehealth: Payer: Self-pay | Admitting: Internal Medicine

## 2021-09-14 NOTE — Telephone Encounter (Signed)
Spoke with patient today and questions answered. 

## 2021-09-14 NOTE — Telephone Encounter (Signed)
Pt called requesting lab results. Advised pt "Your kidney function is decreased, but stable.  Your sugars are not ideally controlled-your A1c is 8.1%.  If you are finding that your sugars are high at home we may need to consider restarting the Lantus.  The other option is increasing Trulicity to 4.5 mg weekly.  Her thyroid functions in the normal range."   Pt is requesting a call. She would like to speak about her A1C level. Pt stated she does not get high glucose readings at home so she is unsure how her A1C could be 8.1.   Please advise  CB: [phone]

## 2021-10-09 DIAGNOSIS — Z794 Long term (current) use of insulin: Secondary | ICD-10-CM | POA: Diagnosis not present

## 2021-10-09 DIAGNOSIS — I5033 Acute on chronic diastolic (congestive) heart failure: Secondary | ICD-10-CM | POA: Diagnosis not present

## 2021-10-09 DIAGNOSIS — E1142 Type 2 diabetes mellitus with diabetic polyneuropathy: Secondary | ICD-10-CM

## 2021-10-19 DIAGNOSIS — R6889 Other general symptoms and signs: Secondary | ICD-10-CM | POA: Diagnosis not present

## 2021-10-19 DIAGNOSIS — H04211 Epiphora due to excess lacrimation, right lacrimal gland: Secondary | ICD-10-CM | POA: Diagnosis not present

## 2021-10-19 DIAGNOSIS — E049 Nontoxic goiter, unspecified: Secondary | ICD-10-CM | POA: Insufficient documentation

## 2021-10-19 DIAGNOSIS — H1045 Other chronic allergic conjunctivitis: Secondary | ICD-10-CM | POA: Diagnosis not present

## 2021-10-19 DIAGNOSIS — H02423 Myogenic ptosis of bilateral eyelids: Secondary | ICD-10-CM | POA: Diagnosis not present

## 2021-10-19 DIAGNOSIS — H0279 Other degenerative disorders of eyelid and periocular area: Secondary | ICD-10-CM | POA: Diagnosis not present

## 2021-10-19 DIAGNOSIS — H53483 Generalized contraction of visual field, bilateral: Secondary | ICD-10-CM | POA: Diagnosis not present

## 2021-10-19 DIAGNOSIS — H16211 Exposure keratoconjunctivitis, right eye: Secondary | ICD-10-CM | POA: Diagnosis not present

## 2021-10-28 ENCOUNTER — Other Ambulatory Visit: Payer: Self-pay | Admitting: Occupational Therapy

## 2021-10-30 NOTE — Patient Outreach (Signed)
Aging Gracefully Program  OT FINAL Visit  10/30/2021  Rebecca [name] [facility] 07-14-1932 [id number]  Visit:  4- Fourth Visit  Start Time:  1000 End Time:  1056 Total Minutes:  56    Readiness to Change:  Readiness to Change Score: 9     Durable Medical Equipment: Durable Medical Equipment: Shower Chair With Back Durable Medical Equipment Distribution Date: 10/28/21 Adaptive Equipment: Other (bedrail) Adaptive Equipment Distribution Date: 10/28/21  Patient Education: Education Provided: Yes Education Details: Educated on use of AE and DME Person(s) Educated: Patient Comprehension: Verbalized Understanding, Returned Demonstration  Goals:  Goals Addressed             This Visit's Progress    COMPLETED: Patient Stated       Pt will improve safety and independence getting in and out of bed, using DME as needed.   ACTION PLANNING - FUNCTIONAL MOBILITY Target Problem Area: Getting in and out of the bed  Why Problem May Occur: -high bed -no bedrail      Target Goal: Pt will improve safety and independence getting in and out of bed, using DME as needed.    STRATEGIES Saving Your Energy: DO: DON'T:  Take breaks    Raise the height of surfaces    Take your time   Remove tripping hazards     Modifying your home environment and making it safe: DO: DON'T:  Install a bed rail Hold onto unsafe surfaces   Remove or strongly secure throw rugs    Provide adequate lighting Use dim lights or lights that cast a lot of shadows  Simplifying the way you set up tasks or daily routines: DO: DON'T:  Move slowly Rush during transfers or walking    Practice It is important to practice the strategies so we can determine if they will be effective in helping to reach your goal. Follow these specific recommendations: Use the bedrail 2.   Take your time 3.   Position yourself safely before getting in or out of the bed  If a strategy does not work the first time, try it again  and again (and maybe again). We may make some changes over the next few sessions, based on how they work.   Ezra Sites, OTR/L  [phone]        COMPLETED: Patient Stated       Pt will improve safety and independence in getting in and out of the tub and performing bathing tasks  ACTION PLANNING - BATHING Target Problem Area: Safe bathing  Why Problem May Occur: -no shower seat -no grab bars -unable to lift leg over side of tub     Target Goal: Pt will improve safety and independence in getting in and out of the tub.    STRATEGIES Saving Your Energy: DO: DON'T:  Use a tub bench/seat Stand while bathing, it uses more energy  Use appropriate adaptive equipment:  long handled sponge, soap on a rope Rush  Keep all items you'll need within easy reach   Modifying your home environment and making it safe: DO: DON'T:  Install grab bars n the shower and next to the toilet   Place a rubber mat along the entire length of the tub Place loose rugs in the bathroom- they can trip you or your walker/cane can get caught on them  Make sure the bathroom is well lit    Install a hand held shower head   Simplifying the way you set up tasks or daily routines:  DO: DON'T:  Plan to bathe/shower before you're overly tired Rush through [employer] all items before getting started    PRACTICE It is important to practice the strategies so we can determine if they will be effective in helping to reach your goal. Follow these specific recommendations: Use a shower seat and grab bars 2.   Take your time and don't bathe when overly tired 3.   Use shower grippers and non-slip rugs  If a strategy does not work the first time, try it again and again (and maybe again). We may make some changes over the next few sessions, based on how they work.   Ezra Sites, OTR/L  [phone]        Post Clinical Reasoning: Client Action (Goal) One Interventions: Pt will improve  safety and independence getting in and out of bed, using DME as needed. Did Client Try?: Yes Targeted Problem Area Status: A Lot Better  Client Action (Goal) Two Interventions: Pt will improve safety and independence in getting in and out of the tub and performing bathing tasks Did Client Try?: Yes Targeted Problem Area Status: A Lot Better  Clinician View Of Client Situation:: Pt is excited for her shower and shower seat, as well as the bedrail that will enable improved ability to get into and out of the bed. Pt trialing each item, OT adjusting for height and teaching safe use, pt demonstrating and verbalizing teach back. OT adjusting shower curtain rod so that shower curtain will hang low enough to prevent water from getting out on the floor. Pt is excited for her modifications, is using DME/AE with improvement in safety. Has benefitted from the program in multiple areas of functioning.  Client View Of His/Her Situation:: Pt reports she is happy with all of her modifications and feels safer in her home. Does not worry about falling as much anymore. States she is trying to see if she can hire some help for housekeeping tasks.  Next Visit Plan:: N/A-discharge today   Ezra Sites, OTR/L  [phone]

## 2021-11-03 DIAGNOSIS — R6889 Other general symptoms and signs: Secondary | ICD-10-CM | POA: Diagnosis not present

## 2021-11-03 DIAGNOSIS — H401132 Primary open-angle glaucoma, bilateral, moderate stage: Secondary | ICD-10-CM | POA: Diagnosis not present

## 2021-11-07 ENCOUNTER — Telehealth: Payer: Self-pay | Admitting: Internal Medicine

## 2021-11-07 ENCOUNTER — Other Ambulatory Visit: Payer: Self-pay

## 2021-11-07 MED ORDER — GABAPENTIN 300 MG PO CAPS: 300.0000 mg | ORAL_CAPSULE | Freq: Every day | ORAL | 0 refills | Status: DC

## 2021-11-07 NOTE — Telephone Encounter (Signed)
30 day supply sent to pharmacy requested

## 2021-11-07 NOTE — Telephone Encounter (Signed)
Patient needs her gabapentin - she is completely out - her home delivery said it would be 7-10 days before she receives her medication - Please send in enough to get her through till she receives it - Please send to [facility] on [address].

## 2021-11-19 ENCOUNTER — Other Ambulatory Visit: Payer: Self-pay | Admitting: Internal Medicine

## 2021-11-24 ENCOUNTER — Ambulatory Visit (INDEPENDENT_AMBULATORY_CARE_PROVIDER_SITE_OTHER): Payer: Medicare HMO

## 2021-11-24 VITALS — Ht 63.0 in | Wt 175.0 lb

## 2021-11-24 DIAGNOSIS — Z780 Asymptomatic menopausal state: Secondary | ICD-10-CM | POA: Diagnosis not present

## 2021-11-24 DIAGNOSIS — Z0000 Encounter for general adult medical examination without abnormal findings: Secondary | ICD-10-CM | POA: Diagnosis not present

## 2021-11-24 NOTE — Progress Notes (Signed)
Subjective:   Rebecca [name] is a 88 y.o. female who presents for Medicare Annual (Subsequent) preventive examination.   Virtual Visit via Telephone Note  I connected with  Rebecca [name] on 11/24/21 at  1:00 PM EDT by telephone and verified that I am speaking with the correct person using two identifiers.  Location: Patient: home  Provider: Paulla Fore  Persons participating in the virtual visit: patient/Nurse Health Advisor   I discussed the limitations, risks, security and privacy concerns of performing an evaluation and management service by telephone and the availability of in person appointments. The patient expressed understanding and agreed to proceed.  Interactive audio and video telecommunications were attempted between this nurse and patient, however failed, due to patient having technical difficulties OR patient did not have access to video capability.  We continued and completed visit with audio only.  Some vital signs may be absent or patient reported.   Daphane Shepherd, LPN  Review of Systems     Cardiac Risk Factors include: advanced age (>43mn, >>59women);diabetes mellitus;dyslipidemia;hypertension     Objective:    Today's Vitals   11/24/21 1311  Weight: 175 lb (79.4 kg)  Height: _0  (1.6 m)   Body mass index is 31 kg/m.     11/24/2021    1:16 PM 11/17/2020    6:14 PM 02/06/2020    9:00 PM 02/01/2020    2:59 PM 01/12/2020    1:55 PM 01/02/2020   10:44 PM 11/24/2018    1:55 PM  Advanced Directives  Does Patient Have a Medical Advance Directive? Yes Yes Yes Yes No No Yes  Type of Advance Directive  Living will Living will    [place]  Does patient want to make changes to medical advance directive?  Yes (ED - Information included in AVS) No - Patient declined No - Patient declined     Copy of [place] Chart?       No - copy requested  Would patient like information on creating a medical  advance directive?     No - Patient declined No - Patient declined     Current Medications (verified) Outpatient Encounter Medications as of 11/24/2021  Medication Sig   acetaminophen (TYLENOL) 500 MG tablet Take 1,000 mg by mouth daily as needed for moderate pain.   Ascorbic Acid (VITAMIN C) 500 MG tablet Take 500 mg by mouth daily.   atorvastatin (LIPITOR) 10 MG tablet TAKE 1 TABLET ONE TIME DAILY AT 6PM   blood glucose meter kit and supplies KIT Dispense based on patient and insurance preference. Use up to four times daily as directed. (FOR E11.9).   brimonidine (ALPHAGAN) 0.2 % ophthalmic solution Place 1 drop into the right eye 2 (two) times daily.   calcium carbonate (OS-CAL) 600 MG TABS Take 600 mg by mouth daily.   Cod Liver Oil 1000 MG CAPS Take 1,000 mg by mouth daily.    Coenzyme Q10 (CO Q-10) 200 MG CAPS Take 2 each by mouth daily in the afternoon.   DORZOLAMIDE HCL-TIMOLOL MAL OP Place 1 drop into the right eye 2 (two) times daily.   Dulaglutide (TRULICITY) 3 MZG/0.1VCSOPN Inject 3 mg as directed once a week.   ELIQUIS 5 MG TABS tablet TAKE 1 TABLET TWICE DAILY (APPOINTMENT IS NEEDED FOR MORE REFILLS)   gabapentin (NEURONTIN) 300 MG capsule Take 1 capsule (300 mg total) by mouth daily.   Glucerna (GLUCERNA) LIQD Take 237 mLs  by mouth daily.   glucose blood (ACCU-CHEK GUIDE) test strip TEST BLOOD SUGAR UP TO FOUR TIMES DAILY   hydrochlorothiazide (HYDRODIURIL) 12.5 MG tablet Take 12.5 mg by mouth daily as needed (swelling).   insulin aspart (NOVOLOG) 100 UNIT/ML FlexPen Patient Sig: 0-15 Units, Subcutaneous, 3 times daily with meals.  CBG < 70: Implement Hypoglycemia measures, call MD.  CBG 70 - 120: 0 units.   CBG 121 - 150: 2 units.  CBG 151 - 200: 3 units.  CBG 201 - 250: 5 units.  CBG . 251 - 300: 8 units.  CBG 301 - 350: 11 units.  CBG 351 - 400: 15 units.  CBG > 400: call MD   Insulin Pen Needle (BD AUTOSHIELD DUO) 30G X 5 MM MISC USE AS DIRECTED WITH INSULIN PEN NEEDLE 4 TIMES  DAILY   Lancet Device MISC One Touch ultra 2 Delica plus lancet device. Use as directed to monitor sugars.  E11.42   Lancets (ONETOUCH ULTRASOFT) lancets Use as instructed   latanoprost (XALATAN) 0.005 % ophthalmic solution Place 1 drop into both eyes at bedtime.   lisinopril (ZESTRIL) 40 MG tablet Take 1 tablet (40 mg total) by mouth daily.   Multiple Vitamin (MULTIVITAMIN) tablet Take 1 tablet by mouth daily. Prn   nitroGLYCERIN (NITROSTAT) 0.4 MG SL tablet DISSOLVE ONE TABLET UNDER TONGUE AS DIRECTED FOR CHEST PAIN (Patient taking differently: Place 0.4 mg under the tongue every 5 (five) minutes as needed for chest pain.)   omeprazole (PRILOSEC) 20 MG capsule TAKE 1 CAPSULE EVERY DAY (STOP PANTOPRAZOLE)   Turmeric 500 MG CAPS Take 1 capsule by mouth daily in the afternoon.   No facility-administered encounter medications on file as of 11/24/2021.    Allergies (verified) Codeine   History: Past Medical History:  Diagnosis Date   Anemia, unspecified    Ankle pain, left    Arthritis    CAD (coronary artery disease)    Esophageal reflux    Fibrocystic breast disease    Hiatal hernia    Hypercholesteremia    Hypertension    IDDM (insulin dependent diabetes mellitus)    Type II   Multinodular goiter    Sickle-cell trait ([place])    Past Surgical History:  Procedure Laterality Date   "FEMALE"  [place]    CORONARY ANGIOPLASTY     FOOT SURGERY  2004   RETINAL LASER PROCEDURE     Family History  Problem Relation Age of Onset   Hypertension Other    Lung cancer Other    Heart disease Other    Gout Sister    Gout Brother    Thyroid disease Brother    Heart attack Father    Heart attack Mother    Diabetes Mother    Gout Brother    Social History   Socioeconomic History   Marital status: Widowed    Spouse name: Not on file   Number of children: 1   Years of education: Not on file   Highest education level: 12th  grade  Occupational History   Occupation: retired  Tobacco Use   Smoking status: Never   Smokeless tobacco: Never  Vaping Use   Vaping Use: Never used  Substance and Sexual Activity   Alcohol use: No   Drug use: No   Sexual activity: Not Currently  Other Topics Concern   Not on file  Social History Narrative  Widowed   Gets regular exercise   Supportive church network      Social Determinants of Health   Financial Resource Strain: Low Risk  (11/24/2021)   Overall Financial Resource Strain (CARDIA)    Difficulty of Paying Living Expenses: Not hard at all  Food Insecurity: No Food Insecurity (11/24/2021)   Hunger Vital Sign    Worried About Running Out of Food in the Last Year: Never true    Ran Out of Food in the Last Year: Never true  Transportation Needs: No Transportation Needs (11/24/2021)   PRAPARE - Hydrologist (Medical): No    Lack of Transportation (Non-Medical): No  Physical Activity: Insufficiently Active (11/24/2021)   Exercise Vital Sign    Days of Exercise per Week: 4 days    Minutes of Exercise per Session: 30 min  Stress: No Stress Concern Present (11/24/2021)   [place]    Feeling of Stress : Not at all  Social Connections: Moderately Isolated (11/24/2021)   Social Connection and Isolation Panel [NHANES]    Frequency of Communication with Friends and Family: More than three times a week    Frequency of Social Gatherings with Friends and Family: More than three times a week    Attends Religious Services: More than 4 times per year    Active Member of [employer] or Organizations: No    Attends Archivist Meetings: Never    Marital Status: Widowed    Tobacco Counseling Counseling given: Not Answered   Clinical Intake:  Pre-visit preparation completed: Yes  Pain : No/denies pain     Nutritional Risks: None Diabetes: No  How often do you need to  have someone help you when you read instructions, pamphlets, or other written materials from your doctor or pharmacy?: 1 - Never  Diabetic?yes  Nutrition Risk Assessment:  Has the patient had any N/V/D within the last 2 months?  No  Does the patient have any non-healing wounds?  No  Has the patient had any unintentional weight loss or weight gain?  No   Diabetes:  Is the patient diabetic?  Yes  If diabetic, was a CBG obtained today?  No  Did the patient bring in their glucometer from home?  No  How often do you monitor your CBG's? 2 xday .   Financial Strains and Diabetes Management:  Are you having any financial strains with the device, your supplies or your medication? No .  Does the patient want to be seen by Chronic Care Management for management of their diabetes?  No  Would the patient like to be referred to a Nutritionist or for Diabetic Management?  No   Diabetic Exams:  Diabetic Eye Exam: Completed 11/2021 Diabetic Foot Exam: Overdue, Pt has been advised about the importance in completing this exam. Pt is scheduled for diabetic foot exam on next office viait .   Interpreter Needed?: No  Information entered by :: Jadene Pierini, LPN   Activities of Daily Living    11/24/2021    1:17 PM  In your present state of health, do you have any difficulty performing the following activities:  Hearing? 0  Vision? 0  Difficulty concentrating or making decisions? 0  Walking or climbing stairs? 0  Dressing or bathing? 0  Doing errands, shopping? 0  Preparing Food and eating ? N  Using the Toilet? N  In the past six months, have you accidently leaked urine?  N  Do you have problems with loss of bowel control? N  Managing your Medications? N  Managing your Finances? N  Housekeeping or managing your Housekeeping? N    Patient Care Team: Binnie Rail, MD as PCP - General (Internal Medicine) Janina Mayo, MD as PCP - Cardiology (Cardiology)  Indicate any recent Medical  Services you may have received from other than [facility] providers in the past year (date may be approximate).     Assessment:   This is a routine wellness examination for Tia.  Hearing/Vision screen Vision Screening - Comments:: Annual eye exams wear glasses   Dietary issues and exercise activities discussed: Current Exercise Habits: Home exercise routine, Type of exercise: stretching, Time (Minutes): 30, Frequency (Times/Week): 3, Weekly Exercise (Minutes/Week): 90, Intensity: Mild, Exercise limited by: orthopedic condition(s)   Goals Addressed             This Visit's Progress    Exercise 3x per week (30 min per time)         Depression Screen    11/24/2021    1:15 PM 09/06/2021    2:15 PM 04/21/2021    1:15 PM 04/10/2021   12:55 PM 02/24/2021    1:02 PM 11/17/2020    6:25 PM 11/25/2018    9:50 AM  PHQ 2/9 Scores  PHQ - 2 Score 0 0 0 0 1 0 0    Fall Risk    11/24/2021    1:13 PM 09/06/2021    2:15 PM 07/19/2021    2:15 PM 05/26/2021    2:30 PM 04/21/2021    1:15 PM  Fall Risk   Falls in the past year? 0 0 0  0  Comment  Patient denies new/ recent falls x 12 months; uses walker "all the time" continues to deny recent falls x 12 months- continues using walker "all the time" Denies new/ recent falls since last outreach 05/01/21- no falls x 12 months; uses walker "all the time"   Number falls in past yr: 0 0 0  0  Injury with Fall? 0 0 0  0  Comment   N/A- no falls reported x 12 months  N/A- no falls reported x 12 months  Risk for fall due to : No Fall Risks Impaired mobility;Medication side effect Impaired mobility;Medication side effect  History of fall(s);Impaired balance/gait;Impaired mobility;Medication side effect  Follow up Falls prevention discussed Falls prevention discussed Falls prevention discussed  Falls prevention discussed    FALL RISK PREVENTION PERTAINING TO THE HOME:  Any stairs in or around the home? No  If so, are there any without handrails? No  Home  free of loose throw rugs in walkways, pet beds, electrical cords, etc? Yes  Adequate lighting in your home to reduce risk of falls? Yes   ASSISTIVE DEVICES UTILIZED TO PREVENT FALLS:  Life alert? No  Use of a cane, walker or w/c? Yes  Grab bars in the bathroom? Yes  Shower chair or bench in shower? Yes  Elevated toilet seat or a handicapped toilet? Yes       11/18/2017   11:33 AM 07/23/2016    4:14 PM  MMSE - Mini Mental State Exam  Orientation to time 5 5  Orientation to Place 5 5  Registration 3 3  Attention/ Calculation 5 4  Recall 1 2  Language- name 2 objects 2 2  Language- repeat 1 1  Language- follow 3 step command 3 3  Language- read & follow direction 1  1  Write a sentence 1 1  Copy design 1 1  Total score 28 28        11/24/2021    1:17 PM 11/17/2020    6:37 PM 11/24/2018    1:56 PM  6CIT Screen  What Year? 0 points 0 points 0 points  What month? 0 points 0 points 0 points  What time? 0 points 0 points 0 points  Count back from 20 0 points 0 points 0 points  Months in reverse 0 points 4 points 2 points  Repeat phrase 4 points 0 points 0 points  Total Score 4 points 4 points 2 points    Immunizations Immunization History  Administered Date(s) Administered   Fluad Quad(high Dose 65+) 11/24/2018, 01/08/2020   Influenza Split 01/22/2011, 02/20/2012   Influenza Whole 03/13/1999, 01/01/2008, 02/07/2009, 12/13/2009   Influenza, High Dose Seasonal PF 02/07/2016, 02/04/2017, 01/27/2018   Influenza,inj,Quad PF,6+ Mos 01/16/2013   Moderna Covid-19 Vaccine Bivalent Booster 73yr & up 09/10/2020   PFIZER(Purple Top)SARS-COV-2 Vaccination 04/25/2019, 05/20/2019, 12/26/2019   Pneumococcal Conjugate-13 07/14/2015   Pneumococcal Polysaccharide-23 05/08/2006    TDAP status: Due, Education has been provided regarding the importance of this vaccine. Advised may receive this vaccine at local pharmacy or Health Dept. Aware to provide a copy of the vaccination record if  obtained from local pharmacy or Health Dept. Verbalized acceptance and understanding.  Flu Vaccine status: Due, Education has been provided regarding the importance of this vaccine. Advised may receive this vaccine at local pharmacy or Health Dept. Aware to provide a copy of the vaccination record if obtained from local pharmacy or Health Dept. Verbalized acceptance and understanding.  Pneumococcal vaccine status: Up to date  Covid-19 vaccine status: Completed vaccines  Qualifies for Shingles Vaccine? Yes   Zostavax completed No   Shingrix Completed?: No.    Education has been provided regarding the importance of this vaccine. Patient has been advised to call insurance company to determine out of pocket expense if they have not yet received this vaccine. Advised may also receive vaccine at local pharmacy or Health Dept. Verbalized acceptance and understanding.  Screening Tests Health Maintenance  Topic Date Due   TETANUS/TDAP  Never done   Zoster Vaccines- Shingrix (1 of 2) Never done   DEXA SCAN  Never done   COVID-19 Vaccine (5 - Pfizer series) 01/11/2021   INFLUENZA VACCINE  10/10/2021   HEMOGLOBIN A1C  03/06/2022   FOOT EXAM  05/24/2022   OPHTHALMOLOGY EXAM  08/10/2022   Pneumonia Vaccine 88 Years old  Completed   HPV VACCINES  Aged Out    Health Maintenance  Health Maintenance Due  Topic Date Due   TETANUS/TDAP  Never done   Zoster Vaccines- Shingrix (1 of 2) Never done   DEXA SCAN  Never done   COVID-19 Vaccine (5 - Pfizer series) 01/11/2021   INFLUENZA VACCINE  10/10/2021    Colorectal cancer screening: No longer required.   Mammogram status: No longer required due to age.  Bone Density status: Ordered 11/24/2021. Pt provided with contact info and advised to call to schedule appt.  Lung Cancer Screening: (Low Dose CT Chest recommended if Age 86-80years, 30 pack-year currently smoking OR have quit w/in 15years.) does not qualify.   Lung Cancer Screening Referral:  n/a  Additional Screening:  Hepatitis C Screening: does not qualify;  Vision Screening: Recommended annual ophthalmology exams for early detection of glaucoma and other disorders of the eye. Is the patient up to date with  their annual eye exam?  Yes  Who is the provider or what is the name of the office in which the patient attends annual eye exams? Dr.Vann  If pt is not established with a provider, would they like to be referred to a provider to establish care? No .   Dental Screening: Recommended annual dental exams for proper oral hygiene  Community Resource Referral / Chronic Care Management: CRR required this visit?  No   CCM required this visit?  No      Plan:     I have personally reviewed and noted the following in the patient's chart:   Medical and social history Use of alcohol, tobacco or illicit drugs  Current medications and supplements including opioid prescriptions. Patient is not currently taking opioid prescriptions. Functional ability and status Nutritional status Physical activity Advanced directives List of other physicians Hospitalizations, surgeries, and ER visits in previous 12 months Vitals Screenings to include cognitive, depression, and falls Referrals and appointments  In addition, I have reviewed and discussed with patient certain preventive protocols, quality metrics, and best practice recommendations. A written personalized care plan for preventive services as well as general preventive health recommendations were provided to patient.     Daphane Shepherd, LPN   [id number]   Nurse Notes: Due flu/DTAP vaccines  DEXA

## 2021-11-24 NOTE — Patient Instructions (Signed)
Rebecca [name] , Thank you for taking time to come for your Medicare Wellness Visit. I appreciate your ongoing commitment to your health goals. Please review the following plan we discussed and let me know if I can assist you in the future.   Screening recommendations/referrals: Colonoscopy: no longer required  Mammogram: no longer required  Bone Density: referral 11/24/2021 Recommended yearly ophthalmology/optometry visit for glaucoma screening and checkup Recommended yearly dental visit for hygiene and checkup  Vaccinations: Influenza vaccine: due in the fall Pneumococcal vaccine: completed Tdap vaccine: due  Shingles vaccine: will consider    Covid-19:completed   Advanced directives: Please bring a copy of your health care power of attorney and living will to the office to be added to your chart at your convenience.   Conditions/risks identified: Aim for 30 minutes of exercise or brisk walking, 6-8 glasses of water, and 5 servings of fruits and vegetables each day.   Next appointment: Follow up in one year for your annual wellness visit    Preventive Care 65 Years and Older, Female Preventive care refers to lifestyle choices and visits with your health care provider that can promote health and wellness. What does preventive care include? A yearly physical exam. This is also called an annual well check. Dental exams once or twice a year. Routine eye exams. Ask your health care provider how often you should have your eyes checked. Personal lifestyle choices, including: Daily care of your teeth and gums. Regular physical activity. Eating a healthy diet. Avoiding tobacco and drug use. Limiting alcohol use. Practicing safe sex. Taking low-dose aspirin every day. Taking vitamin and mineral supplements as recommended by your health care provider. What happens during an annual well check? The services and screenings done by your health care provider during your annual well check will  depend on your age, overall health, lifestyle risk factors, and family history of disease. Counseling  Your health care provider may ask you questions about your: Alcohol use. Tobacco use. Drug use. Emotional well-being. Home and relationship well-being. Sexual activity. Eating habits. History of falls. Memory and ability to understand (cognition). Work and work Astronomer. Reproductive health. Screening  You may have the following tests or measurements: Height, weight, and BMI. Blood pressure. Lipid and cholesterol levels. These may be checked every 5 years, or more frequently if you are over 35 years old. Skin check. Lung cancer screening. You may have this screening every year starting at age 58 if you have a 30-pack-year history of smoking and currently smoke or have quit within the past 15 years. Fecal occult blood test (FOBT) of the stool. You may have this test every year starting at age 28. Flexible sigmoidoscopy or colonoscopy. You may have a sigmoidoscopy every 5 years or a colonoscopy every 10 years starting at age 27. Hepatitis C blood test. Hepatitis B blood test. Sexually transmitted disease (STD) testing. Diabetes screening. This is done by checking your blood sugar (glucose) after you have not eaten for a while (fasting). You may have this done every 1-3 years. Bone density scan. This is done to screen for osteoporosis. You may have this done starting at age 58. Mammogram. This may be done every 1-2 years. Talk to your health care provider about how often you should have regular mammograms. Talk with your health care provider about your test results, treatment options, and if necessary, the need for more tests. Vaccines  Your health care provider may recommend certain vaccines, such as: Influenza vaccine. This is recommended every  year. Tetanus, diphtheria, and acellular pertussis (Tdap, Td) vaccine. You may need a Td booster every 10 years. Zoster vaccine. You may  need this after age 57. Pneumococcal 13-valent conjugate (PCV13) vaccine. One dose is recommended after age 59. Pneumococcal polysaccharide (PPSV23) vaccine. One dose is recommended after age 41. Talk to your health care provider about which screenings and vaccines you need and how often you need them. This information is not intended to replace advice given to you by your health care provider. Make sure you discuss any questions you have with your health care provider. Document Released: 03/25/2015 Document Revised: 11/16/2015 Document Reviewed: 12/28/2014 Elsevier Interactive Patient Education  2017 [place] Prevention in the Home Falls can cause injuries. They can happen to people of all ages. There are many things you can do to make your home safe and to help prevent falls. What can I do on the outside of my home? Regularly fix the edges of walkways and driveways and fix any cracks. Remove anything that might make you trip as you walk through a door, such as a raised step or threshold. Trim any bushes or trees on the path to your home. Use bright outdoor lighting. Clear any walking paths of anything that might make someone trip, such as rocks or tools. Regularly check to see if handrails are loose or broken. Make sure that both sides of any steps have handrails. Any raised decks and porches should have guardrails on the edges. Have any leaves, snow, or ice cleared regularly. Use sand or salt on walking paths during winter. Clean up any spills in your garage right away. This includes oil or grease spills. What can I do in the bathroom? Use night lights. Install grab bars by the toilet and in the tub and shower. Do not use towel bars as grab bars. Use non-skid mats or decals in the tub or shower. If you need to sit down in the shower, use a plastic, non-slip stool. Keep the floor dry. Clean up any water that spills on the floor as soon as it happens. Remove soap buildup in  the tub or shower regularly. Attach bath mats securely with double-sided non-slip rug tape. Do not have throw rugs and other things on the floor that can make you trip. What can I do in the bedroom? Use night lights. Make sure that you have a light by your bed that is easy to reach. Do not use any sheets or blankets that are too big for your bed. They should not hang down onto the floor. Have a firm chair that has side arms. You can use this for support while you get dressed. Do not have throw rugs and other things on the floor that can make you trip. What can I do in the kitchen? Clean up any spills right away. Avoid walking on wet floors. Keep items that you use a lot in easy-to-reach places. If you need to reach something above you, use a strong step stool that has a grab bar. Keep electrical cords out of the way. Do not use floor polish or wax that makes floors slippery. If you must use wax, use non-skid floor wax. Do not have throw rugs and other things on the floor that can make you trip. What can I do with my stairs? Do not leave any items on the stairs. Make sure that there are handrails on both sides of the stairs and use them. Fix handrails that are broken or  loose. Make sure that handrails are as long as the stairways. Check any carpeting to make sure that it is firmly attached to the stairs. Fix any carpet that is loose or worn. Avoid having throw rugs at the top or bottom of the stairs. If you do have throw rugs, attach them to the floor with carpet tape. Make sure that you have a light switch at the top of the stairs and the bottom of the stairs. If you do not have them, ask someone to add them for you. What else can I do to help prevent falls? Wear shoes that: Do not have high heels. Have rubber bottoms. Are comfortable and fit you well. Are closed at the toe. Do not wear sandals. If you use a stepladder: Make sure that it is fully opened. Do not climb a closed  stepladder. Make sure that both sides of the stepladder are locked into place. Ask someone to hold it for you, if possible. Clearly mark and make sure that you can see: Any grab bars or handrails. First and last steps. Where the edge of each step is. Use tools that help you move around (mobility aids) if they are needed. These include: Canes. Walkers. Scooters. Crutches. Turn on the lights when you go into a dark area. Replace any light bulbs as soon as they burn out. Set up your furniture so you have a clear path. Avoid moving your furniture around. If any of your floors are uneven, fix them. If there are any pets around you, be aware of where they are. Review your medicines with your doctor. Some medicines can make you feel dizzy. This can increase your chance of falling. Ask your doctor what other things that you can do to help prevent falls. This information is not intended to replace advice given to you by your health care provider. Make sure you discuss any questions you have with your health care provider. Document Released: 12/23/2008 Document Revised: 08/04/2015 Document Reviewed: 04/02/2014 Elsevier Interactive Patient Education  2017 [employer].

## 2021-11-29 ENCOUNTER — Encounter: Payer: Self-pay | Admitting: Podiatry

## 2021-11-29 ENCOUNTER — Ambulatory Visit: Payer: Medicare HMO | Admitting: Podiatry

## 2021-11-29 DIAGNOSIS — E1142 Type 2 diabetes mellitus with diabetic polyneuropathy: Secondary | ICD-10-CM | POA: Diagnosis not present

## 2021-11-29 DIAGNOSIS — M79674 Pain in right toe(s): Secondary | ICD-10-CM | POA: Diagnosis not present

## 2021-11-29 DIAGNOSIS — M79675 Pain in left toe(s): Secondary | ICD-10-CM | POA: Diagnosis not present

## 2021-11-29 DIAGNOSIS — B351 Tinea unguium: Secondary | ICD-10-CM

## 2021-11-29 NOTE — Patient Instructions (Signed)
You may purchase Lock Laces for your shoes which turn your shoes into slip on shoes. You will no longer have to lace your shoes. They may be purchased on [employer].  ?

## 2021-11-30 ENCOUNTER — Other Ambulatory Visit: Payer: Self-pay

## 2021-11-30 NOTE — Patient Outreach (Signed)
Aging Gracefully Program  11/30/2021  Tonilynn Bie[facility] 01/19/1933 [id number]   [facility] Evaluation Interviewer made contact with patient. Aging Gracefully 5 months survey completed.   Sent email to [facility].   [place] Management Assistant [phone]

## 2021-12-03 ENCOUNTER — Other Ambulatory Visit: Payer: Self-pay | Admitting: Internal Medicine

## 2021-12-05 DIAGNOSIS — R6889 Other general symptoms and signs: Secondary | ICD-10-CM | POA: Diagnosis not present

## 2021-12-05 DIAGNOSIS — H401132 Primary open-angle glaucoma, bilateral, moderate stage: Secondary | ICD-10-CM | POA: Diagnosis not present

## 2021-12-05 DIAGNOSIS — H1045 Other chronic allergic conjunctivitis: Secondary | ICD-10-CM | POA: Diagnosis not present

## 2021-12-05 NOTE — Progress Notes (Signed)
Subjective:  Patient ID: Rebecca [name], female    DOB: Jun 30, 1932,  MRN: [id number]  Rebecca [name] presents to clinic today for preventative diabetic foot care and painful elongated mycotic toenails 1-5 bilaterally which are tender when wearing enclosed shoe gear. Pain is relieved with periodic professional debridement.  Objective: Vascular Examination: CFT <4 seconds b/l LE. Faintly palpable DP pulses b/l LE. Faintly palpable PT pulse(s) b/l LE. Pedal hair absent. No pain with calf compression b/l. +2 pitting edema BLE. No ischemia or gangrene noted b/l LE. No cyanosis or clubbing noted b/l LE.  Dermatological Examination: Pedal skin is warm and supple b/l LE. No open wounds b/l LE. No interdigital macerations noted b/l LE. Toenails 1-5 left, R hallux, R 3rd toe, R 4th toe, and R 5th toe elongated, discolored, dystrophic, thickened, and crumbly with subungual debris and tenderness to dorsal palpation. Incurvated nailplate b/l lower extremities with tenderness to palpation. No erythema, no edema, no drainage noted. No fluctuance.   Musculoskeletal Examination: Muscle strength 5/5 to all lower extremity muscle groups bilaterally. Severely collapsed flatfoot of the left lower extremity. Wearing appropriate fitting shoe gear. Utilizes rollator for ambulation assistance.  Neurological Examination: Pt has subjective symptoms of neuropathy. Protective sensation intact 5/5 intact bilaterally with 10g monofilament b/l. Vibratory sensation diminished b/l.  Patient states blood glucose was 93 mg/dl today.  Last known  HgA1c was near 7%.    New problem(s): None.   PCP is Binnie Rail, MD , and last visit was  October 09, 2021.  Allergies  Allergen Reactions   Codeine     "Makes me drunk"    Review of Systems: Negative except as noted in the HPI.  Objective: No changes noted in today's physical examination. Rebecca [name] is a pleasant 88 y.o. female in NAD. AAO x  3.  Vascular Examination: CFT <4 seconds b/l LE. Faintly palpable DP pulses b/l LE. Faintly palpable PT pulse(s) b/l LE. Pedal hair absent. No pain with calf compression b/l. +2 pitting edema BLE. No ischemia or gangrene noted b/l LE. No cyanosis or clubbing noted b/l LE.  Dermatological Examination: Pedal skin is warm and supple b/l LE. No open wounds b/l LE. No interdigital macerations noted b/l LE. Toenails 1-5 left, R hallux, R 3rd toe, R 4th toe, and R 5th toe elongated, discolored, dystrophic, thickened, and crumbly with subungual debris and tenderness to dorsal palpation. Incurvated nailplate b/l lower extremities with tenderness to palpation. No erythema, no edema, no drainage noted. No fluctuance.   Musculoskeletal Examination: Muscle strength 5/5 to all lower extremity muscle groups bilaterally. Severely collapsed flatfoot of the left lower extremity. Wearing appropriate fitting shoe gear. Utilizes rollator for ambulation assistance.  Neurological Examination: Pt has subjective symptoms of neuropathy. Protective sensation intact 5/5 intact bilaterally with 10g monofilament b/l. Vibratory sensation diminished b/l.  Assessment/Plan: 1. Pain due to onychomycosis of toenails of both feet   2. Diabetic peripheral neuropathy associated with type 2 diabetes mellitus ([place])     -Consent given for treatment as described below: -Examined patient. -Toenails 1-5 b/l were debrided in length and girth with sterile nail nippers and dremel without iatrogenic bleeding.  -You may purchase Lock Laces for your shoes which turn your shoes into slip on shoes. You will no longer have to lace your shoes. They may be purchased on [employer].  Information discussed with her son. -Patient/POA to call should there be question/concern in the interim.   Return in about 3 months (around 02/28/2022).  Marzetta Board, DPM

## 2021-12-26 ENCOUNTER — Encounter: Payer: Self-pay | Admitting: Internal Medicine

## 2022-01-15 ENCOUNTER — Telehealth: Payer: Self-pay | Admitting: Internal Medicine

## 2022-01-15 NOTE — Telephone Encounter (Signed)
Caller & Relationship to patient: PATIENT  Call Back Number: [phone]  Date of Last Office Visit: 07/14/21  Date of Next Office Visit:  01/26/22  Medication(s) to be Refilled: TRULICITY 3 MG  Preferred Pharmacy:  [facility], [address], [place] [address] Phone: [phone]  Fax: [phone]   COMMENTS: pt says she had her last shot this morning and takes it weekly.  ** Please notify patient to allow 48-72 hours to process** **Let patient know to contact pharmacy at the end of the day to make sure medication is ready. ** **If patient has not been seen in a year or longer, book an appointment **Advise to use MyChart for refill requests OR to contact their pharmacy

## 2022-01-16 ENCOUNTER — Other Ambulatory Visit: Payer: Self-pay

## 2022-01-16 MED ORDER — TRULICITY 3 MG/0.5ML ~~LOC~~ SOAJ: 3.0000 mg | SUBCUTANEOUS | 2 refills | Status: DC

## 2022-01-16 NOTE — Telephone Encounter (Signed)
Sent to pharmacy on file for mail order today.

## 2022-01-19 ENCOUNTER — Ambulatory Visit: Payer: Medicare HMO | Admitting: Internal Medicine

## 2022-01-22 ENCOUNTER — Ambulatory Visit: Payer: Medicare HMO | Attending: Internal Medicine | Admitting: Internal Medicine

## 2022-01-22 ENCOUNTER — Encounter: Payer: Self-pay | Admitting: Internal Medicine

## 2022-01-22 VITALS — BP 104/60 | HR 63 | Ht 63.0 in | Wt 178.2 lb

## 2022-01-22 DIAGNOSIS — I480 Paroxysmal atrial fibrillation: Secondary | ICD-10-CM

## 2022-01-22 DIAGNOSIS — R6889 Other general symptoms and signs: Secondary | ICD-10-CM | POA: Diagnosis not present

## 2022-01-22 NOTE — Patient Instructions (Signed)
Medication Instructions:  No Changes In Medications at this time.  *If you need a refill on your cardiac medications before your next appointment, please call your pharmacy*  Lab Work: None Ordered At This Time.  If you have labs (blood work) drawn today and your tests are completely normal, you will receive your results only by: MyChart Message (if you have MyChart) OR A paper copy in the mail If you have any lab test that is abnormal or we need to change your treatment, we will call you to review the results.  Testing/Procedures: None Ordered At This Time.   Follow-Up: At [facility], you and your health needs are our priority.  As part of our continuing mission to provide you with exceptional heart care, we have created designated Provider Care Teams.  These Care Teams include your primary Cardiologist (physician) and Advanced Practice Providers (APPs -  Physician Assistants and Nurse Practitioners) who all work together to provide you with the care you need, when you need it.  Your next appointment:   6 month(s)  The format for your next appointment:   In Person  Provider:   Branch, Mary E, MD           

## 2022-01-22 NOTE — Progress Notes (Signed)
Cardiology Office Note:    Date:  01/22/2022   ID:  [name], DOB 11/06/1932, MRN [id number]  PCP:  Binnie Rail, MD   [facility] Cardiologist:  Janina Mayo, MD     Referring MD: Binnie Rail, MD   No chief complaint on file. Establish Care  History of Present Illness:    Rebecca [name] is a 88 y.o. female with a hx of CAD PCI in the 1990s s/p PCI x2, DMII,  HTN sickle cell trait referral to establish care. She last saw Dr. Aundra Dubin in 2016. No changes at that time. Since then she has been diagnosed with paroxysmal afib on eliquis. She has CKD stage IIIa. 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 She was found to have afib with RVR at this time.  She subsequently had a massive PE in 02/2020 causing syncope s/p tpa. Her echo showed normal LV function, no valve dx, PA pressure 46 mmHg, RV had normal function. She was dc'ed to a SNF. She had a cardiac monitor during this time that did not show afib.  She was in the nursing facility for a good while.  Today, she notes she can feel some fast heart racing at times. No syncope. No chest pressure with activities. She denies SOB. She is in a nursing skilled facility.  No orthopnea or PND.  She mainly conducts her activities of daily living without any issues. Her family comes in to help.  Interim hx 01/22/2022 Today,   Past Medical History:  Diagnosis Date   Anemia, unspecified    Ankle pain, left    Arthritis    CAD (coronary artery disease)    Esophageal reflux    Fibrocystic breast disease    Hiatal hernia    Hypercholesteremia    Hypertension    IDDM (insulin dependent diabetes mellitus)    Type II   Multinodular goiter    Sickle-cell trait ([place])     Past Surgical History:  Procedure Laterality Date   "FEMALE"  [place]    CORONARY ANGIOPLASTY     FOOT SURGERY  2004    RETINAL LASER PROCEDURE      Current Medications: Current Meds  Medication Sig   acetaminophen (TYLENOL) 500 MG tablet Take 1,000 mg by mouth daily as needed for moderate pain.   Ascorbic Acid (VITAMIN C) 500 MG tablet Take 500 mg by mouth daily.   atorvastatin (LIPITOR) 10 MG tablet TAKE 1 TABLET ONE TIME DAILY AT 6PM   blood glucose meter kit and supplies KIT Dispense based on patient and insurance preference. Use up to four times daily as directed. (FOR E11.9).   brimonidine (ALPHAGAN) 0.2 % ophthalmic solution Place 1 drop into the right eye 2 (two) times daily.   calcium carbonate (OS-CAL) 600 MG TABS Take 600 mg by mouth daily.   Cod Liver Oil 1000 MG CAPS Take 1,000 mg by mouth daily.    DORZOLAMIDE HCL-TIMOLOL MAL OP Place 1 drop into the right eye 2 (two) times daily.   Dulaglutide (TRULICITY) 3 AY/3.0ZS SOPN Inject 3 mg as directed once a week.   ELIQUIS 5 MG TABS tablet TAKE 1 TABLET TWICE DAILY (APPOINTMENT IS NEEDED FOR MORE REFILLS)   gabapentin (NEURONTIN) 300 MG capsule Take 1 capsule (300 mg total) by mouth daily.   Glucerna ([place]) LIQD  Take 237 mLs by mouth daily.   glucose blood (ACCU-CHEK GUIDE) test strip TEST BLOOD SUGAR UP TO FOUR TIMES DAILY   hydrochlorothiazide (HYDRODIURIL) 12.5 MG tablet Take 12.5 mg by mouth daily as needed (swelling).   insulin aspart (NOVOLOG) 100 UNIT/ML FlexPen Patient Sig: 0-15 Units, Subcutaneous, 3 times daily with meals.  CBG < 70: Implement Hypoglycemia measures, call MD.  CBG 70 - 120: 0 units.   CBG 121 - 150: 2 units.  CBG 151 - 200: 3 units.  CBG 201 - 250: 5 units.  CBG . 251 - 300: 8 units.  CBG 301 - 350: 11 units.  CBG 351 - 400: 15 units.  CBG > 400: call MD   Insulin Pen Needle (BD AUTOSHIELD DUO) 30G X 5 MM MISC USE AS DIRECTED WITH INSULIN PEN NEEDLE 4 TIMES DAILY   ketorolac (ACULAR) 0.5 % ophthalmic solution Place 1 drop into the right eye 4 (four) times daily.   Lancet Device MISC One Touch ultra 2 Delica plus lancet  device. Use as directed to monitor sugars.  E11.42   Lancets (ONETOUCH ULTRASOFT) lancets Use as instructed   latanoprost (XALATAN) 0.005 % ophthalmic solution Place 1 drop into both eyes at bedtime.   lisinopril (ZESTRIL) 40 MG tablet TAKE 1 TABLET EVERY DAY   Multiple Vitamin (MULTIVITAMIN) tablet Take 1 tablet by mouth daily. Prn   nitroGLYCERIN (NITROSTAT) 0.4 MG SL tablet DISSOLVE ONE TABLET UNDER TONGUE AS DIRECTED FOR CHEST PAIN (Patient taking differently: Place 0.4 mg under the tongue every 5 (five) minutes as needed for chest pain.)   omeprazole (PRILOSEC) 20 MG capsule TAKE 1 CAPSULE EVERY DAY (STOP PANTOPRAZOLE)   RHOPRESSA 0.02 % SOLN Apply to eye.   Turmeric 500 MG CAPS Take 1 capsule by mouth daily in the afternoon.     Allergies:   Codeine   Social History   Socioeconomic History   Marital status: Widowed    Spouse name: Not on file   Number of children: 1   Years of education: Not on file   Highest education level: 12th grade  Occupational History   Occupation: retired  Tobacco Use   Smoking status: Never   Smokeless tobacco: Never  Vaping Use   Vaping Use: Never used  Substance and Sexual Activity   Alcohol use: No   Drug use: No   Sexual activity: Not Currently  Other Topics Concern   Not on file  Social History Narrative   Widowed   Gets regular exercise   Supportive church network      Social Determinants of Health   Financial Resource Strain: [place]  (11/24/2021)   Overall Financial Resource Strain (CARDIA)    Difficulty of Paying Living Expenses: Not hard at all  Food Insecurity: No Food Insecurity (11/24/2021)   Hunger Vital Sign    Worried About Running Out of Food in the Last Year: Never true    [place] in the Last Year: Never true  Transportation Needs: No Transportation Needs (11/24/2021)   PRAPARE - Hydrologist (Medical): No    Lack of Transportation (Non-Medical): No  Physical Activity:  Insufficiently Active (11/24/2021)   Exercise Vital Sign    Days of Exercise per Week: 4 days    Minutes of Exercise per Session: 30 min  Stress: No Stress Concern Present (11/24/2021)   [place]    Feeling of Stress : Not at  all  Social Connections: Moderately Isolated (11/24/2021)   Social Connection and Isolation Panel [NHANES]    Frequency of Communication with Friends and Family: More than three times a week    Frequency of Social Gatherings with Friends and Family: More than three times a week    Attends Religious Services: More than 4 times per year    Active Member of [employer] or Organizations: No    Attends Archivist Meetings: Never    Marital Status: Widowed     Family History: The patient's family history includes Diabetes in her mother; Gout in her brother, brother, and sister; Heart attack in her father and mother; Heart disease in an other family member; Hypertension in an other family member; Lung cancer in an other family member; Thyroid disease in her brother.  ROS:   Please see the history of present illness.     All other systems reviewed and are negative.  EKGs/Labs/Other Studies Reviewed:    The following studies were reviewed today:   EKG:  EKG is  ordered today.  The ekg ordered today demonstrates   NSR, poor R wave progression  11/13- NSR, poor r wave progression  Recent Labs: 04/07/2021: Pro B Natriuretic peptide (BNP) 200.0 07/14/2021: ALT 12; Hemoglobin 10.8; Platelets 170.0 09/04/2021: BUN 25; Creatinine, Ser 1.09; Potassium 4.2; Sodium 141; TSH 1.03   Recent Lipid Panel    Component Value Date/Time   CHOL 181 07/14/2021 1638   TRIG 84.0 07/14/2021 1638   HDL 79.50 07/14/2021 1638   CHOLHDL 2 07/14/2021 1638   VLDL 16.8 07/14/2021 1638   LDLCALC 85 07/14/2021 1638   LDLCALC 94 01/13/2021 1636     Risk Assessment/Calculations:    CHA2DS2-VASc Score = 6   This  indicates a 9.7% annual risk of stroke. The patient's score is based upon: CHF History: 0 HTN History: 1 Diabetes History: 1 Stroke History: 0 Vascular Disease History: 1 Age Score: 2 Gender Score: 1          Physical Exam:    VS:  BP 104/60   Pulse 63   Ht _0  (1.6 m)   Wt 178 lb 3.2 oz (80.8 kg)   SpO2 99%   BMI 31.57 kg/m     Wt Readings from Last 3 Encounters:  01/22/22 178 lb 3.2 oz (80.8 kg)  11/24/21 175 lb (79.4 kg)  09/04/21 179 lb (81.2 kg)     GEN: Elderly,  Well nourished, well developed in no acute distress HEENT: Normal NECK: No JVD; No carotid bruits LYMPHATICS: No lymphadenopathy CARDIAC: RRR, no murmurs, rubs, gallops RESPIRATORY:  Clear to auscultation without rales, wheezing or rhonchi  ABDOMEN: Soft, non-tender, non-distended MUSCULOSKELETAL:  No edema; No deformity  SKIN: Warm and dry NEUROLOGIC:  Alert and oriented x 3 PSYCHIATRIC:  Normal affect   ASSESSMENT:    Ischemic Heart Disease: She has hx of ischemic heart disease from the 1990s. No CHF. Continue statin. Not on ASA, on eliquis. She has not needed to use her SL nitro.  pAF: CHADS2VASC=6.  She had a recent monitor with no recurrence. Will continue eliquis. Will refrain from BB for now.   HTN:  Well controlled. Continue current regimen.  PLAN:    In order of problems listed above:  No changes Follow up in 6 months      Medication Adjustments/Labs and Tests Ordered: Current medicines are reviewed at length with the patient today.  Concerns regarding medicines are outlined above.  Orders Placed This  Encounter  Procedures   EKG 12-Lead   No orders of the defined types were placed in this encounter.   Patient Instructions  Medication Instructions:  No Changes In Medications at this time.  *If you need a refill on your cardiac medications before your next appointment, please call your pharmacy*  Lab Work: None Ordered At This Time.  If you have labs (blood work) drawn  today and your tests are completely normal, you will receive your results only by: [place] (if you have MyChart) OR A paper copy in the mail If you have any lab test that is abnormal or we need to change your treatment, we will call you to review the results.  Testing/Procedures: None Ordered At This Time.   Follow-Up: At [facility], you and your health needs are our priority.  As part of our continuing mission to provide you with exceptional heart care, we have created designated Provider Care Teams.  These Care Teams include your primary Cardiologist (physician) and Advanced Practice Providers (APPs -  Physician Assistants and Nurse Practitioners) who all work together to provide you with the care you need, when you need it.  Your next appointment:   6 month(s)  The format for your next appointment:   In Person  Provider:   Janina Mayo, MD             Signed, Janina Mayo, MD  01/22/2022 11:32 AM    [place]

## 2022-01-25 ENCOUNTER — Encounter: Payer: Self-pay | Admitting: Internal Medicine

## 2022-01-25 NOTE — Progress Notes (Unsigned)
Subjective:    Patient ID: Rebecca [name], female    DOB: 1932-10-31, 88 y.o.   MRN: [id number]     HPI Idelle is here for follow up of her chronic medical problems, including DM, CKD, htn, hld, PAfib, GERD  She went to the cardio - BP was low - she dec lisinopril to 20 mg daily.  She does not check her blood pressure at home.  Sugar today was 91.  Sugars have been well controlled-going to the upper 90s, but not lower.  Medications and allergies reviewed with patient and updated if appropriate.  Current Outpatient Medications on File Prior to Visit  Medication Sig Dispense Refill   acetaminophen (TYLENOL) 500 MG tablet Take 1,000 mg by mouth daily as needed for moderate pain.     Ascorbic Acid (VITAMIN C) 500 MG tablet Take 500 mg by mouth daily.     atorvastatin (LIPITOR) 10 MG tablet TAKE 1 TABLET ONE TIME DAILY AT 6PM 90 tablet 1   calcium carbonate (OS-CAL) 600 MG TABS Take 600 mg by mouth daily.     Cod Liver Oil 1000 MG CAPS Take 1,000 mg by mouth daily.      DORZOLAMIDE HCL-TIMOLOL MAL OP Place 1 drop into the right eye 2 (two) times daily.     Dulaglutide (TRULICITY) 3 MG/0.5ML SOPN Inject 3 mg as directed once a week. 2 mL 2   ELIQUIS 5 MG TABS tablet TAKE 1 TABLET TWICE DAILY (APPOINTMENT IS NEEDED FOR MORE REFILLS) 180 tablet 2   gabapentin (NEURONTIN) 300 MG capsule Take 1 capsule (300 mg total) by mouth daily. 30 capsule 0   Glucerna (GLUCERNA) LIQD Take 237 mLs by mouth daily.     glucose blood (ACCU-CHEK GUIDE) test strip TEST BLOOD SUGAR UP TO FOUR TIMES DAILY 400 strip 2   hydrochlorothiazide (HYDRODIURIL) 12.5 MG tablet Take 12.5 mg by mouth daily as needed (swelling).     insulin aspart (NOVOLOG) 100 UNIT/ML FlexPen Patient Sig: 0-15 Units, Subcutaneous, 3 times daily with meals.  CBG < 70: Implement Hypoglycemia measures, call MD.  CBG 70 - 120: 0 units.   CBG 121 - 150: 2 units.  CBG 151 - 200: 3 units.  CBG 201 - 250: 5 units.  CBG . 251 - 300: 8  units.  CBG 301 - 350: 11 units.  CBG 351 - 400: 15 units.  CBG > 400: call MD 15 mL 11   Insulin Pen Needle (BD AUTOSHIELD DUO) 30G X 5 MM MISC USE AS DIRECTED WITH INSULIN PEN NEEDLE 4 TIMES DAILY 400 each 2   ketorolac (ACULAR) 0.5 % ophthalmic solution Place 1 drop into the right eye 4 (four) times daily.     Lancet Device MISC One Touch ultra 2 Delica plus lancet device. Use as directed to monitor sugars.  E11.42 1 each 0   Lancets (ONETOUCH ULTRASOFT) lancets Use as instructed 100 each 12   latanoprost (XALATAN) 0.005 % ophthalmic solution Place 1 drop into both eyes at bedtime.     lisinopril (ZESTRIL) 40 MG tablet TAKE 1 TABLET EVERY DAY 90 tablet 2   Multiple Vitamin (MULTIVITAMIN) tablet Take 1 tablet by mouth daily. Prn     nitroGLYCERIN (NITROSTAT) 0.4 MG SL tablet DISSOLVE ONE TABLET UNDER TONGUE AS DIRECTED FOR CHEST PAIN (Patient taking differently: Place 0.4 mg under the tongue every 5 (five) minutes as needed for chest pain.) 25 tablet 5   omeprazole (PRILOSEC) 20 MG capsule TAKE  1 CAPSULE EVERY DAY (STOP PANTOPRAZOLE) 90 capsule 1   Turmeric 500 MG CAPS Take 1 capsule by mouth daily in the afternoon.     RHOPRESSA 0.02 % SOLN Apply to eye. (Patient not taking: Reported on 01/26/2022)     No current facility-administered medications on file prior to visit.     Review of Systems  Constitutional:  Negative for fever.  Respiratory:  Positive for shortness of breath (if she overexerts herself). Negative for cough and wheezing.   Cardiovascular:  Positive for chest pain (occ tightness - cardiology aware) and leg swelling. Negative for palpitations.  Neurological:  Positive for dizziness (with low BP) and light-headedness (once when BP was low). Negative for headaches.       Objective:   Vitals:   01/26/22 1436 01/26/22 1440  BP: (!) 142/70 (!) 142/66  Pulse: 60   Temp: 98.5 F (36.9 C)   SpO2: 100%    BP Readings from Last 3 Encounters:  01/26/22 (!) 142/66  01/22/22  104/60  09/04/21 (!) 142/72   Wt Readings from Last 3 Encounters:  01/26/22 179 lb (81.2 kg)  01/22/22 178 lb 3.2 oz (80.8 kg)  11/24/21 175 lb (79.4 kg)   Body mass index is 31.71 kg/m.    Physical Exam Constitutional:      General: She is not in acute distress.    Appearance: Normal appearance.  HENT:     Head: Normocephalic and atraumatic.  Eyes:     Conjunctiva/sclera: Conjunctivae normal.  Cardiovascular:     Rate and Rhythm: Normal rate and regular rhythm.     Heart sounds: Normal heart sounds. No murmur heard. Pulmonary:     Effort: Pulmonary effort is normal. No respiratory distress.     Breath sounds: Normal breath sounds. No wheezing.  Musculoskeletal:     Cervical back: Neck supple.     Right lower leg: Edema present.     Left lower leg: Edema present.  Lymphadenopathy:     Cervical: No cervical adenopathy.  Skin:    General: Skin is warm and dry.     Findings: No rash.  Neurological:     Mental Status: She is alert. Mental status is at baseline.  Psychiatric:        Mood and Affect: Mood normal.        Behavior: Behavior normal.        Lab Results  Component Value Date   WBC 5.5 07/14/2021   HGB 10.8 (L) 07/14/2021   HCT 33.2 (L) 07/14/2021   PLT 170.0 07/14/2021   GLUCOSE 102 (H) 09/04/2021   CHOL 181 07/14/2021   TRIG 84.0 07/14/2021   HDL 79.50 07/14/2021   LDLCALC 85 07/14/2021   ALT 12 07/14/2021   AST 16 07/14/2021   NA 141 09/04/2021   K 4.2 09/04/2021   CL 107 09/04/2021   CREATININE 1.09 09/04/2021   BUN 25 (H) 09/04/2021   CO2 28 09/04/2021   TSH 1.03 09/04/2021   INR 1.9 (H) 02/05/2020   HGBA1C 8.1 (H) 09/04/2021   MICROALBUR 1.9 10/24/2015     Assessment & Plan:    See Problem List for Assessment and Plan of chronic medical problems.

## 2022-01-25 NOTE — Patient Instructions (Addendum)
    Flu immunization administered today.      Blood work was ordered.   The lab is on the first floor.    Medications changes include :   none    Return in about 6 months (around 07/27/2022) for follow up.    Novolog Instructions 0-15 Units, Subcutaneous, 3 times daily with meals. CBG < 70: Implement Hypoglycemia measures, call MD. CBG 70 - 120: 0 units. CBG 121 - 150: 2 units. CBG 151 - 200: 3 units. CBG 201 - 250: 5 units. CBG . 251 - 300: 8 units. CBG 301 - 350: 11 units. CBG 351 - 400: 15 units. CBG > 400: call MD

## 2022-01-26 ENCOUNTER — Encounter: Payer: Self-pay | Admitting: Internal Medicine

## 2022-01-26 ENCOUNTER — Ambulatory Visit (INDEPENDENT_AMBULATORY_CARE_PROVIDER_SITE_OTHER): Payer: Medicare HMO | Admitting: Internal Medicine

## 2022-01-26 VITALS — BP 142/66 | HR 60 | Temp 98.5°F | Ht 63.0 in | Wt 179.0 lb

## 2022-01-26 DIAGNOSIS — I10 Essential (primary) hypertension: Secondary | ICD-10-CM

## 2022-01-26 DIAGNOSIS — K219 Gastro-esophageal reflux disease without esophagitis: Secondary | ICD-10-CM | POA: Diagnosis not present

## 2022-01-26 DIAGNOSIS — I480 Paroxysmal atrial fibrillation: Secondary | ICD-10-CM | POA: Diagnosis not present

## 2022-01-26 DIAGNOSIS — N1831 Chronic kidney disease, stage 3a: Secondary | ICD-10-CM | POA: Diagnosis not present

## 2022-01-26 DIAGNOSIS — R6889 Other general symptoms and signs: Secondary | ICD-10-CM | POA: Diagnosis not present

## 2022-01-26 DIAGNOSIS — Z23 Encounter for immunization: Secondary | ICD-10-CM | POA: Diagnosis not present

## 2022-01-26 DIAGNOSIS — I2510 Atherosclerotic heart disease of native coronary artery without angina pectoris: Secondary | ICD-10-CM | POA: Diagnosis not present

## 2022-01-26 DIAGNOSIS — E1142 Type 2 diabetes mellitus with diabetic polyneuropathy: Secondary | ICD-10-CM | POA: Diagnosis not present

## 2022-01-26 DIAGNOSIS — E7800 Pure hypercholesterolemia, unspecified: Secondary | ICD-10-CM | POA: Diagnosis not present

## 2022-01-26 DIAGNOSIS — Z794 Long term (current) use of insulin: Secondary | ICD-10-CM

## 2022-01-26 LAB — COMPREHENSIVE METABOLIC PANEL
ALT: 10 U/L (ref 0–35)
AST: 17 U/L (ref 0–37)
Albumin: 3.9 g/dL (ref 3.5–5.2)
Alkaline Phosphatase: 52 U/L (ref 39–117)
BUN: 23 mg/dL (ref 6–23)
CO2: 31 mEq/L (ref 19–32)
Calcium: 9.3 mg/dL (ref 8.4–10.5)
Chloride: 105 mEq/L (ref 96–112)
Creatinine, Ser: 1.15 mg/dL (ref 0.40–1.20)
GFR: 42.81 mL/min — ABNORMAL LOW (ref >60.00)
Glucose, Bld: 116 mg/dL — ABNORMAL HIGH (ref 70–99)
Potassium: 4.8 mEq/L (ref 3.5–5.1)
Sodium: 139 mEq/L (ref 135–145)
Total Bilirubin: 0.3 mg/dL (ref 0.2–1.2)
Total Protein: 6.5 g/dL (ref 6.0–8.3)

## 2022-01-26 LAB — CBC WITH DIFFERENTIAL/PLATELET
Basophils Absolute: 0.1 10*3/uL (ref 0.0–0.1)
Basophils Relative: 1.8 % (ref 0.0–3.0)
Eosinophils Absolute: 0.2 10*3/uL (ref 0.0–0.7)
Eosinophils Relative: 4.4 % (ref 0.0–5.0)
HCT: 33.4 % — ABNORMAL LOW (ref 36.0–46.0)
Hemoglobin: 10.7 g/dL — ABNORMAL LOW (ref 12.0–15.0)
Lymphocytes Relative: 38.7 % (ref 12.0–46.0)
Lymphs Abs: 2 10*3/uL (ref 0.7–4.0)
MCHC: 32.2 g/dL (ref 30.0–36.0)
MCV: 81.5 fl (ref 78.0–100.0)
Monocytes Absolute: 0.7 10*3/uL (ref 0.1–1.0)
Monocytes Relative: 12.7 % — ABNORMAL HIGH (ref 3.0–12.0)
Neutro Abs: 2.2 10*3/uL (ref 1.4–7.7)
Neutrophils Relative %: 42.4 % — ABNORMAL LOW (ref 43.0–77.0)
Platelets: 200 10*3/uL (ref 150.0–400.0)
RBC: 4.09 Mil/uL (ref 3.87–5.11)
RDW: 16.5 % — ABNORMAL HIGH (ref 11.5–15.5)
WBC: 5.2 10*3/uL (ref 4.0–10.5)

## 2022-01-26 LAB — LIPID PANEL
Cholesterol: 187 mg/dL (ref 0–200)
HDL: 89.1 mg/dL (ref >39.00)
LDL Cholesterol: 80 mg/dL (ref 0–99)
NonHDL: 97.93
Total CHOL/HDL Ratio: 2
Triglycerides: 90 mg/dL (ref 0.0–149.0)
VLDL: 18 mg/dL (ref 0.0–40.0)

## 2022-01-26 LAB — TSH: TSH: 0.93 u[IU]/mL (ref 0.35–5.50)

## 2022-01-26 LAB — HEMOGLOBIN A1C: Hgb A1c MFr Bld: 7.5 % — ABNORMAL HIGH (ref 4.6–6.5)

## 2022-01-26 NOTE — Assessment & Plan Note (Signed)
Chronic GERD controlled Continue omeprazole 20 mg daily  

## 2022-01-26 NOTE — Assessment & Plan Note (Signed)
Chronic Regular exercise and healthy diet encouraged Check lipid panel  Continue atorvastatin 10 mg daily 

## 2022-01-26 NOTE — Assessment & Plan Note (Signed)
Chronic CMP, CBC 

## 2022-01-26 NOTE — Assessment & Plan Note (Signed)
Chronic Former cardiology No symptoms consistent with angina Continue atorvastatin 10 mg daily

## 2022-01-26 NOTE — Assessment & Plan Note (Signed)
Chronic Following with cardiology On Eliquis 5 mg twice daily CBC, CMP, TSH 

## 2022-01-26 NOTE — Assessment & Plan Note (Signed)
Chronic   Lab Results  Component Value Date   HGBA1C 8.1 (H) 09/04/2021   Sugars not ideally controlled Testing sugars 1-2 times a day Check A1c Continue Trulicity 3 mg weekly, NovoLog sliding scale as needed Stressed regular exercise, diabetic diet

## 2022-01-26 NOTE — Assessment & Plan Note (Addendum)
Chronic Blood pressure well controlled CMP Continue hydrochlorothiazide 12.5 mg daily lisinopril 40 mg daily

## 2022-01-29 ENCOUNTER — Other Ambulatory Visit: Payer: Self-pay | Admitting: Internal Medicine

## 2022-01-30 ENCOUNTER — Telehealth: Payer: Self-pay | Admitting: Internal Medicine

## 2022-01-30 NOTE — Telephone Encounter (Signed)
PT calls today regarding their recent lab results as well as clarification on a recent diagnosis they had seen on their AVS. PT is wanting a call back from Dr.Burns and staff to go over their lab results from 11/17.  PT was also wanting to know more about their Chronic Kidney Disease (Stage 3A) diagnosis and what that means for her/how to handle it in the future?   PT stated that this had been brought up to her a couple years ago (stated it was prior to her going to the nursing home, couldn't remember the year) and was informed that this was nothing to be worried about at the time.  CB for PT: [phone]

## 2022-01-31 NOTE — Telephone Encounter (Signed)
Spoke with patient and labs explained.  Copy put in mail today.

## 2022-02-07 ENCOUNTER — Other Ambulatory Visit: Payer: Self-pay | Admitting: Internal Medicine

## 2022-02-08 ENCOUNTER — Encounter: Payer: Self-pay | Admitting: Internal Medicine

## 2022-02-08 ENCOUNTER — Emergency Department (HOSPITAL_COMMUNITY)
Admission: EM | Admit: 2022-02-08 | Discharge: 2022-02-09 | Disposition: A | Discharge status: Home / Self Care | Payer: Medicare HMO | Attending: Emergency Medicine | Admitting: Emergency Medicine

## 2022-02-08 ENCOUNTER — Encounter (HOSPITAL_COMMUNITY): Payer: Self-pay

## 2022-02-08 ENCOUNTER — Telehealth: Payer: Self-pay | Admitting: Internal Medicine

## 2022-02-08 ENCOUNTER — Other Ambulatory Visit: Payer: Self-pay

## 2022-02-08 DIAGNOSIS — R001 Bradycardia, unspecified: Secondary | ICD-10-CM | POA: Insufficient documentation

## 2022-02-08 DIAGNOSIS — Z7901 Long term (current) use of anticoagulants: Secondary | ICD-10-CM | POA: Insufficient documentation

## 2022-02-08 DIAGNOSIS — E860 Dehydration: Secondary | ICD-10-CM | POA: Insufficient documentation

## 2022-02-08 DIAGNOSIS — Z794 Long term (current) use of insulin: Secondary | ICD-10-CM | POA: Diagnosis not present

## 2022-02-08 DIAGNOSIS — I10 Essential (primary) hypertension: Secondary | ICD-10-CM | POA: Diagnosis not present

## 2022-02-08 DIAGNOSIS — R42 Dizziness and giddiness: Secondary | ICD-10-CM | POA: Diagnosis not present

## 2022-02-08 DIAGNOSIS — R531 Weakness: Secondary | ICD-10-CM

## 2022-02-08 DIAGNOSIS — Z79899 Other long term (current) drug therapy: Secondary | ICD-10-CM | POA: Diagnosis not present

## 2022-02-08 LAB — COMPREHENSIVE METABOLIC PANEL
ALT: 15 U/L (ref 0–44)
AST: 16 U/L (ref 15–41)
Albumin: 3.5 g/dL (ref 3.5–5.0)
Alkaline Phosphatase: 47 U/L (ref 38–126)
Anion gap: 8 (ref 5–15)
BUN: 33 mg/dL — ABNORMAL HIGH (ref 8–23)
CO2: 24 mmol/L (ref 22–32)
Calcium: 9.5 mg/dL (ref 8.9–10.3)
Chloride: 107 mmol/L (ref 98–111)
Creatinine, Ser: 1.16 mg/dL — ABNORMAL HIGH (ref 0.44–1.00)
GFR, Estimated: 46 mL/min — ABNORMAL LOW (ref >60)
Glucose, Bld: 146 mg/dL — ABNORMAL HIGH (ref 70–99)
Potassium: 4.3 mmol/L (ref 3.5–5.1)
Sodium: 139 mmol/L (ref 135–145)
Total Bilirubin: 0.3 mg/dL (ref 0.3–1.2)
Total Protein: 6.5 g/dL (ref 6.5–8.1)

## 2022-02-08 LAB — PROTIME-INR
INR: 1.4 — ABNORMAL HIGH (ref 0.8–1.2)
Prothrombin Time: 16.7 seconds — ABNORMAL HIGH (ref 11.4–15.2)

## 2022-02-08 LAB — CBC
HCT: 33.1 % — ABNORMAL LOW (ref 36.0–46.0)
Hemoglobin: 10.9 g/dL — ABNORMAL LOW (ref 12.0–15.0)
MCH: 26.8 pg (ref 26.0–34.0)
MCHC: 32.9 g/dL (ref 30.0–36.0)
MCV: 81.3 fL (ref 80.0–100.0)
Platelets: 217 10*3/uL (ref 150–400)
RBC: 4.07 MIL/uL (ref 3.87–5.11)
RDW: 15.6 % — ABNORMAL HIGH (ref 11.5–15.5)
WBC: 3.9 10*3/uL — ABNORMAL LOW (ref 4.0–10.5)
nRBC: 0 % (ref 0.0–0.2)

## 2022-02-08 LAB — DIFFERENTIAL
Abs Immature Granulocytes: 0.01 10*3/uL (ref 0.00–0.07)
Basophils Absolute: 0.1 10*3/uL (ref 0.0–0.1)
Basophils Relative: 2 %
Eosinophils Absolute: 0.1 10*3/uL (ref 0.0–0.5)
Eosinophils Relative: 3 %
Immature Granulocytes: 0 %
Lymphocytes Relative: 40 %
Lymphs Abs: 1.6 10*3/uL (ref 0.7–4.0)
Monocytes Absolute: 0.4 10*3/uL (ref 0.1–1.0)
Monocytes Relative: 10 %
Neutro Abs: 1.7 10*3/uL (ref 1.7–7.7)
Neutrophils Relative %: 45 %

## 2022-02-08 LAB — I-STAT CHEM 8, ED
BUN: 33 mg/dL — ABNORMAL HIGH (ref 8–23)
Calcium, Ion: 1.21 mmol/L (ref 1.15–1.40)
Chloride: 107 mmol/L (ref 98–111)
Creatinine, Ser: 1.1 mg/dL — ABNORMAL HIGH (ref 0.44–1.00)
Glucose, Bld: 145 mg/dL — ABNORMAL HIGH (ref 70–99)
HCT: 32 % — ABNORMAL LOW (ref 36.0–46.0)
Hemoglobin: 10.9 g/dL — ABNORMAL LOW (ref 12.0–15.0)
Potassium: 4.4 mmol/L (ref 3.5–5.1)
Sodium: 139 mmol/L (ref 135–145)
TCO2: 23 mmol/L (ref 22–32)

## 2022-02-08 LAB — URINALYSIS, ROUTINE W REFLEX MICROSCOPIC
Bacteria, UA: NONE SEEN
Bilirubin Urine: NEGATIVE
Glucose, UA: NEGATIVE mg/dL
Hgb urine dipstick: NEGATIVE
Ketones, ur: NEGATIVE mg/dL
Nitrite: NEGATIVE
Protein, ur: NEGATIVE mg/dL
Specific Gravity, Urine: 1.011 (ref 1.005–1.030)
pH: 5 (ref 5.0–8.0)

## 2022-02-08 LAB — ETHANOL: Alcohol, Ethyl (B): <10 mg/dL (ref <10)

## 2022-02-08 LAB — RAPID URINE DRUG SCREEN, HOSP PERFORMED
Amphetamines: NOT DETECTED
Barbiturates: NOT DETECTED
Benzodiazepines: NOT DETECTED
Cocaine: NOT DETECTED
Opiates: NOT DETECTED
Tetrahydrocannabinol: NOT DETECTED

## 2022-02-08 LAB — APTT: aPTT: 37 seconds — ABNORMAL HIGH (ref 24–36)

## 2022-02-08 NOTE — ED Triage Notes (Addendum)
Pt here from home. Pt reports feeling dizzy and weak today. No LOC or syncope. Unable to ambulate normally. Pt reports feeling normal going to bed and symptoms started around 7am when she woke up. Pt reports she was worried about her BP - reports that 160s is high for her.

## 2022-02-08 NOTE — Telephone Encounter (Signed)
Attempted to reach patient today but phone keeps ringing and no voicemail picking up.  Called alternate number and spoke with her daughter.  She states patient called EMS today after not feeling well and dizzy. BP was elevated when EMS came out but late they got it to go down. She declined to go with them. Daughter felt like she still may need to be seen so appointment set for tomorrow.

## 2022-02-08 NOTE — Telephone Encounter (Signed)
Patient said that she got dizzy when she got up and vomited a little - she wants her doctor to give her a call

## 2022-02-08 NOTE — ED Provider Triage Note (Signed)
  Emergency Medicine Provider Triage Evaluation Note  MRN:  [id number]  Arrival date & time: 02/08/22    Medically screening exam initiated at 10:17 PM.   CC:   Dizziness and Weakness   HPI:  Rebecca [name] is a 88 y.o. year-old female presents to the ED with chief complaint of dizziness and weakness that started this morning when she awoke.  LKW last night before bed.  She states she feels off balance when she is walking and like she is going to tip over.  Denies any recent illnesses.  States she hasn't had much of an appetite.  History provided by patient. ROS:  -As included in HPI PE:   Vitals:   02/08/22 2122  BP: (!) 166/68  Pulse: (!) 54  Resp: 18  Temp: 97.9 F (36.6 C)  SpO2: 100%    Non-toxic appearing No respiratory distress CN3-12 grossly intact MDM:  Based on signs and symptoms, stroke is highest on my differential, followed by dehydration, deconditioning, or electrolyte imbalance. I've ordered labs and imaging in triage to expedite lab/diagnostic workup.  Patient was informed that the remainder of the evaluation will be completed by another provider, this initial triage assessment does not replace that evaluation, and the importance of remaining in the ED until their evaluation is complete.    Roxy Horseman, PA-C 02/08/22 2219

## 2022-02-08 NOTE — Progress Notes (Signed)
Subjective:    Patient ID: Rebecca [name], female    DOB: 08-Jan-1933, 88 y.o.   MRN: [id number]      HPI [facility] is here for  Chief Complaint  Patient presents with   Follow-up    Patient still feels weak but not as dizzy as much     Dizzy and nauseated -went to ED last night - tests reviewed and  - symptoms thought to be related to dehydration.  She received IV fluids and the dizziness improved.  Sugar this morning was 120.  She did eat breakfast.  She knows she needs to drink more fluids but she did a little bit today and when she goes home she plans agree more fluids.  She [place] drinks water and some tea.   Dizziness is better.  Just weak.  She has been eating breakfast and then dinner, but has been eating much recently. Underwent today she has lost weight over the last couple of weeks.  She is not sure if that is what is causing some of the weakness or not.  Will   Medications and allergies reviewed with patient and updated if appropriate.  Current Outpatient Medications on File Prior to Visit  Medication Sig Dispense Refill   acetaminophen (TYLENOL) 500 MG tablet Take 1,000 mg by mouth daily as needed for moderate pain.     Ascorbic Acid (VITAMIN C) 500 MG tablet Take 500 mg by mouth daily.     atorvastatin (LIPITOR) 10 MG tablet TAKE 1 TABLET ONE TIME DAILY AT 6PM 90 tablet 1   brimonidine (ALPHAGAN) 0.2 % ophthalmic solution SMARTSIG:In Eye(s)     calcium carbonate (OS-CAL) 600 MG TABS Take 600 mg by mouth daily.     Cod Liver Oil 1000 MG CAPS Take 1,000 mg by mouth daily.      DORZOLAMIDE HCL-TIMOLOL MAL OP Place 1 drop into the right eye 2 (two) times daily.     Dulaglutide (TRULICITY) 3 MG/0.5ML SOPN Inject 3 mg as directed once a week. 2 mL 2   ELIQUIS 5 MG TABS tablet TAKE 1 TABLET TWICE DAILY (APPOINTMENT IS NEEDED FOR MORE REFILLS) 180 tablet 2   gabapentin (NEURONTIN) 300 MG capsule TAKE 1 CAPSULE EVERY DAY 90 capsule 3   Glucerna (GLUCERNA) LIQD  Take 237 mLs by mouth daily.     glucose blood (ACCU-CHEK GUIDE) test strip TEST BLOOD SUGAR UP TO FOUR TIMES DAILY 400 strip 2   hydrochlorothiazide (HYDRODIURIL) 12.5 MG tablet Take 12.5 mg by mouth daily as needed (swelling).     insulin aspart (NOVOLOG) 100 UNIT/ML FlexPen Patient Sig: 0-15 Units, Subcutaneous, 3 times daily with meals.  CBG < 70: Implement Hypoglycemia measures, call MD.  CBG 70 - 120: 0 units.   CBG 121 - 150: 2 units.  CBG 151 - 200: 3 units.  CBG 201 - 250: 5 units.  CBG . 251 - 300: 8 units.  CBG 301 - 350: 11 units.  CBG 351 - 400: 15 units.  CBG > 400: call MD 15 mL 11   Insulin Pen Needle (BD AUTOSHIELD DUO) 30G X 5 MM MISC USE AS DIRECTED WITH INSULIN PEN NEEDLE 4 TIMES DAILY 400 each 2   ketorolac (ACULAR) 0.5 % ophthalmic solution Place 1 drop into the right eye 4 (four) times daily.     Lancet Device MISC One Touch ultra 2 Delica plus lancet device. Use as directed to monitor sugars.  E11.42 1 each 0   Lancets (  ONETOUCH ULTRASOFT) lancets Use as instructed 100 each 12   latanoprost (XALATAN) 0.005 % ophthalmic solution Place 1 drop into both eyes at bedtime.     lisinopril (ZESTRIL) 40 MG tablet TAKE 1 TABLET EVERY DAY 90 tablet 2   Multiple Vitamin (MULTIVITAMIN) tablet Take 1 tablet by mouth daily. Prn     nitroGLYCERIN (NITROSTAT) 0.4 MG SL tablet DISSOLVE ONE TABLET UNDER TONGUE AS DIRECTED FOR CHEST PAIN (Patient taking differently: Place 0.4 mg under the tongue every 5 (five) minutes as needed for chest pain.) 25 tablet 5   omeprazole (PRILOSEC) 20 MG capsule TAKE 1 CAPSULE EVERY DAY (STOP PANTOPRAZOLE) 90 capsule 10   RHOPRESSA 0.02 % SOLN Place 1 drop into the right eye at bedtime.     Turmeric 500 MG CAPS Take 1 capsule by mouth daily in the afternoon.     No current facility-administered medications on file prior to visit.    Review of Systems  Constitutional:  Positive for fatigue. Negative for fever.       Weakness  HENT:  Negative for congestion,  ear pain, sinus pain and sore throat.   Respiratory:  Positive for shortness of breath (with exertion at times). Negative for cough and wheezing.   Cardiovascular:  Positive for palpitations and leg swelling. Negative for chest pain.  Gastrointestinal:  Positive for nausea (with dizziness). Negative for diarrhea.  Neurological:  Positive for dizziness. Negative for headaches.       Objective:   Vitals:   02/09/22 1550  BP: 126/60  Pulse: (!) 58  Temp: 97.7 F (36.5 C)  SpO2: 99%   BP Readings from Last 3 Encounters:  02/09/22 126/60  02/09/22 (!) 190/60  01/26/22 (!) 142/66   Wt Readings from Last 3 Encounters:  02/09/22 173 lb (78.5 kg)  02/08/22 179 lb (81.2 kg)  01/26/22 179 lb (81.2 kg)   Body mass index is 30.65 kg/m.    Physical Exam Constitutional:      General: She is not in acute distress.    Appearance: Normal appearance.  HENT:     Head: Normocephalic and atraumatic.  Eyes:     Conjunctiva/sclera: Conjunctivae normal.  Cardiovascular:     Rate and Rhythm: Normal rate and regular rhythm.     Heart sounds: Normal heart sounds. No murmur heard. Pulmonary:     Effort: Pulmonary effort is normal. No respiratory distress.     Breath sounds: Normal breath sounds. No wheezing.  Musculoskeletal:     Cervical back: Neck supple.     Right lower leg: Edema present.     Left lower leg: Edema present.  Lymphadenopathy:     Cervical: No cervical adenopathy.  Skin:    General: Skin is warm and dry.     Findings: No rash.  Neurological:     Mental Status: She is alert. Mental status is at baseline.  Psychiatric:        Mood and Affect: Mood normal.        Behavior: Behavior normal.         CT HEAD WO CONTRAST CLINICAL DATA:  Vertigo  EXAM: CT HEAD WITHOUT CONTRAST  TECHNIQUE: Contiguous axial images were obtained from the base of the skull through the vertex without intravenous contrast.  RADIATION DOSE REDUCTION: This exam was performed according  to the departmental dose-optimization program which includes automated exposure control, adjustment of the mA and/or kV according to patient size and/or use of iterative reconstruction technique.  COMPARISON:  None Available.  FINDINGS: Brain: There is no mass, hemorrhage or extra-axial collection. The size and configuration of the ventricles and extra-axial CSF spaces are normal. The brain parenchyma is normal, without acute or chronic infarction.  Vascular: No abnormal hyperdensity of the major intracranial arteries or dural venous sinuses. No intracranial atherosclerosis.  Skull: The visualized skull base, calvarium and extracranial soft tissues are normal.  Sinuses/Orbits: No fluid levels or advanced mucosal thickening of the visualized paranasal sinuses. No mastoid or middle ear effusion. The orbits are normal.  IMPRESSION: No acute intracranial abnormality.  Electronically Signed   By: Deatra Robinson M.D.   On: 02/09/2022 01:12    Lab Results  Component Value Date   WBC 3.9 (L) 02/08/2022   HGB 10.9 (L) 02/08/2022   HCT 32.0 (L) 02/08/2022   PLT 217 02/08/2022   GLUCOSE 145 (H) 02/08/2022   CHOL 187 01/26/2022   TRIG 90.0 01/26/2022   HDL 89.10 01/26/2022   LDLCALC 80 01/26/2022   ALT 15 02/08/2022   AST 16 02/08/2022   NA 139 02/08/2022   K 4.4 02/08/2022   CL 107 02/08/2022   CREATININE 1.10 (H) 02/08/2022   BUN 33 (H) 02/08/2022   CO2 24 02/08/2022   TSH 0.93 01/26/2022   INR 1.4 (H) 02/08/2022   HGBA1C 7.5 (H) 01/26/2022   MICROALBUR 1.9 10/24/2015       Assessment & Plan:    See Problem List for Assessment and Plan of chronic medical problems.

## 2022-02-09 ENCOUNTER — Ambulatory Visit (INDEPENDENT_AMBULATORY_CARE_PROVIDER_SITE_OTHER): Payer: Medicare HMO | Admitting: Internal Medicine

## 2022-02-09 ENCOUNTER — Emergency Department (HOSPITAL_COMMUNITY): Payer: Medicare HMO

## 2022-02-09 VITALS — BP 126/60 | HR 58 | Temp 97.7°F | Ht 63.0 in | Wt 173.0 lb

## 2022-02-09 DIAGNOSIS — E1142 Type 2 diabetes mellitus with diabetic polyneuropathy: Secondary | ICD-10-CM | POA: Diagnosis not present

## 2022-02-09 DIAGNOSIS — R531 Weakness: Secondary | ICD-10-CM

## 2022-02-09 DIAGNOSIS — R42 Dizziness and giddiness: Secondary | ICD-10-CM

## 2022-02-09 DIAGNOSIS — Z794 Long term (current) use of insulin: Secondary | ICD-10-CM

## 2022-02-09 DIAGNOSIS — I10 Essential (primary) hypertension: Secondary | ICD-10-CM

## 2022-02-09 MED ORDER — SODIUM CHLORIDE 0.9 % IV BOLUS
500.0000 mL | Freq: Once | INTRAVENOUS | Status: AC
  Administered 2022-02-09: 500 mL via INTRAVENOUS

## 2022-02-09 NOTE — Patient Instructions (Addendum)
     Medications changes include :   none   Increase your water intake.    Monitor your sugar and blood pressure.   Eat lunch for the next few days.    Return if symptoms worsen or fail to improve.

## 2022-02-09 NOTE — Assessment & Plan Note (Signed)
Acute Experiencing generalized weakness-seen in the emergency room yesterday for dizziness and weakness-dizziness resolved after IV fluids and she does feel little bit better, but still is weak Reviewed test from emergency room-symptom thought to be related to dehydration She is pushing more fluids and stressed drinking more water on a regular basis Advise monitoring sugar, blood pressure in addition to increasing her fluids She has lost a little bit of weight and if she continues to skip lunch we may need to adjust her medication-will try to eat lunch or snack midday these next few days to make sure that is not the cause

## 2022-02-09 NOTE — ED Provider Notes (Signed)
[facility] EMERGENCY DEPARTMENT Provider Note   CSN: [id number] Arrival date & time: 02/08/22  2052     History  Chief Complaint  Patient presents with   Dizziness   Weakness    [place] is a 88 y.o. female.  Patient presents to the emergency department for evaluation of weakness and dizziness.  Patient reports that symptoms have been present all day.  She has not felt well today, has not eaten anything.  She reports the only liquid she took was with her meds.  She has been very concerned about her health recently, blood pressures have been up and down at her doctor's office.  She reports that she feels fine lying in the bed but when she gets up and moves she feels lightheaded and is afraid she will fall.       Home Medications Prior to Admission medications   Medication Sig Start Date End Date Taking? Authorizing Provider  acetaminophen (TYLENOL) 500 MG tablet Take 1,000 mg by mouth daily as needed for moderate pain.    [provider]  Ascorbic Acid (VITAMIN C) 500 MG tablet Take 500 mg by mouth daily.    [provider]  atorvastatin (LIPITOR) 10 MG tablet TAKE 1 TABLET ONE TIME DAILY AT 6PM 09/06/21   Binnie Rail, MD  calcium carbonate (OS-CAL) 600 MG TABS Take 600 mg by mouth daily.    [provider]  Cod Liver Oil 1000 MG CAPS Take 1,000 mg by mouth daily.     [provider]  DORZOLAMIDE HCL-TIMOLOL MAL OP Place 1 drop into the right eye 2 (two) times daily.    [provider]  Dulaglutide (TRULICITY) 3 0000000 SOPN Inject 3 mg as directed once a week. 01/16/22   Burns, [name], MD  ELIQUIS 5 MG TABS tablet TAKE 1 TABLET TWICE DAILY (APPOINTMENT IS NEEDED FOR MORE REFILLS) 04/26/21   Binnie Rail, MD  gabapentin (NEURONTIN) 300 MG capsule TAKE 1 CAPSULE EVERY DAY 02/07/22   Burns, [name], MD  Glucerna (GLUCERNA) LIQD Take 237 mLs by mouth daily. 01/22/20   [provider]  glucose  blood (ACCU-CHEK GUIDE) test strip TEST BLOOD SUGAR UP TO FOUR TIMES DAILY 11/20/21   Binnie Rail, MD  hydrochlorothiazide (HYDRODIURIL) 12.5 MG tablet Take 12.5 mg by mouth daily as needed (swelling).    [provider]  insulin aspart (NOVOLOG) 100 UNIT/ML FlexPen Patient Sig: 0-15 Units, Subcutaneous, 3 times daily with meals.  CBG < 70: Implement Hypoglycemia measures, call MD.  CBG 70 - 120: 0 units.   CBG 121 - 150: 2 units.  CBG 151 - 200: 3 units.  CBG 201 - 250: 5 units.  CBG . 251 - 300: 8 units.  CBG 301 - 350: 11 units.  CBG 351 - 400: 15 units.  CBG > 400: call MD 05/26/21   Binnie Rail, MD  Insulin Pen Needle (BD AUTOSHIELD DUO) 30G X 5 MM MISC USE AS DIRECTED WITH INSULIN PEN NEEDLE 4 TIMES DAILY 04/03/21   Binnie Rail, MD  ketorolac (ACULAR) 0.5 % ophthalmic solution Place 1 drop into the right eye 4 (four) times daily. 11/03/21   [provider]  Lancet Device MISC One Touch ultra 2 Delica plus lancet device. Use as directed to monitor sugars.  E11.42 04/11/21   Binnie Rail, MD  Lancets [facility] ULTRASOFT) lancets Use as instructed 04/19/21   Binnie Rail, MD  latanoprost (  XALATAN) 0.005 % ophthalmic solution Place 1 drop into both eyes at bedtime. 01/11/20   [provider]  lisinopril (ZESTRIL) 40 MG tablet TAKE 1 TABLET EVERY DAY 12/04/21   Pincus Sanes, MD  Multiple Vitamin (MULTIVITAMIN) tablet Take 1 tablet by mouth daily. Prn    [provider]  nitroGLYCERIN (NITROSTAT) 0.4 MG SL tablet DISSOLVE ONE TABLET UNDER TONGUE AS DIRECTED FOR CHEST PAIN Patient taking differently: Place 0.4 mg under the tongue every 5 (five) minutes as needed for chest pain. 08/05/13   Laurey Morale, MD  omeprazole (PRILOSEC) 20 MG capsule TAKE 1 CAPSULE EVERY DAY (STOP PANTOPRAZOLE) 01/29/22   Burns, [name], MD  Turmeric 500 MG CAPS Take 1 capsule by mouth daily in the afternoon.    [provider]      Allergies    Codeine    Review of  Systems   Review of Systems  Physical Exam Updated Vital Signs BP (!) 188/61   Pulse (!) 48   Temp 97.8 F (36.6 C) (Oral)   Resp 10   Ht 5\' 3"  (1.6 m)   Wt 81.2 kg   SpO2 100%   BMI 31.71 kg/m  Physical Exam Vitals and nursing note reviewed.  Constitutional:      General: She is not in acute distress.    Appearance: She is well-developed.  HENT:     Head: Normocephalic and atraumatic.     Mouth/Throat:     Mouth: Mucous membranes are moist.  Eyes:     General: Vision grossly intact. Gaze aligned appropriately.     Extraocular Movements: Extraocular movements intact.     Conjunctiva/sclera: Conjunctivae normal.  Cardiovascular:     Rate and Rhythm: Normal rate and regular rhythm.     Pulses: Normal pulses.     Heart sounds: Normal heart sounds, S1 normal and S2 normal. No murmur heard.    No friction rub. No gallop.  Pulmonary:     Effort: Pulmonary effort is normal. No respiratory distress.     Breath sounds: Normal breath sounds.  Abdominal:     General: Bowel sounds are normal.     Palpations: Abdomen is soft.     Tenderness: There is no abdominal tenderness. There is no guarding or rebound.     Hernia: No hernia is present.  Musculoskeletal:        General: No swelling.     Cervical back: Full passive range of motion without pain, normal range of motion and neck supple. No spinous process tenderness or muscular tenderness. Normal range of motion.     Right lower leg: No edema.     Left lower leg: No edema.  Skin:    General: Skin is warm and dry.     Capillary Refill: Capillary refill takes less than 2 seconds.     Findings: No ecchymosis, erythema, rash or wound.  Neurological:     General: No focal deficit present.     Mental Status: She is alert and oriented to person, place, and time.     GCS: GCS eye subscore is 4. GCS verbal subscore is 5. GCS motor subscore is 6.     Cranial Nerves: Cranial nerves 2-12 are intact.     Sensory: Sensation is intact.      Motor: Motor function is intact.     Coordination: Coordination is intact.  Psychiatric:        Attention and Perception: Attention normal.  Mood and Affect: Mood normal.        Speech: Speech normal.        Behavior: Behavior normal.     ED Results / Procedures / Treatments   Labs (all labs ordered are listed, but only abnormal results are displayed) Labs Reviewed  PROTIME-INR - Abnormal; Notable for the following components:      Result Value   Prothrombin Time 16.7 (*)    INR 1.4 (*)    All other components within normal limits  APTT - Abnormal; Notable for the following components:   aPTT 37 (*)    All other components within normal limits  CBC - Abnormal; Notable for the following components:   WBC 3.9 (*)    Hemoglobin 10.9 (*)    HCT 33.1 (*)    RDW 15.6 (*)    All other components within normal limits  COMPREHENSIVE METABOLIC PANEL - Abnormal; Notable for the following components:   Glucose, Bld 146 (*)    BUN 33 (*)    Creatinine, Ser 1.16 (*)    GFR, Estimated 46 (*)    All other components within normal limits  URINALYSIS, ROUTINE W REFLEX MICROSCOPIC - Abnormal; Notable for the following components:   Color, Urine STRAW (*)    Leukocytes,Ua MODERATE (*)    All other components within normal limits  I-STAT CHEM 8, ED - Abnormal; Notable for the following components:   BUN 33 (*)    Creatinine, Ser 1.10 (*)    Glucose, Bld 145 (*)    Hemoglobin 10.9 (*)    HCT 32.0 (*)    All other components within normal limits  ETHANOL  DIFFERENTIAL  RAPID URINE DRUG SCREEN, HOSP PERFORMED    EKG EKG Interpretation  Date/Time:  Thursday February 08 2022 22:05:11 EST Ventricular Rate:  54 PR Interval:  200 QRS Duration: 82 QT Interval:  446 QTC Calculation: 422 R Axis:   -1 Text Interpretation: Sinus bradycardia Low voltage QRS Cannot rule out Anterior infarct , age undetermined Abnormal ECG Confirmed by Orpah Greek [phone]) on 02/09/2022  12:05:19 AM  Radiology CT HEAD WO CONTRAST  Result Date: 02/09/2022 CLINICAL DATA:  Vertigo EXAM: CT HEAD WITHOUT CONTRAST TECHNIQUE: Contiguous axial images were obtained from the base of the skull through the vertex without intravenous contrast. RADIATION DOSE REDUCTION: This exam was performed according to the departmental dose-optimization program which includes automated exposure control, adjustment of the mA and/or kV according to patient size and/or use of iterative reconstruction technique. COMPARISON:  None Available. FINDINGS: Brain: There is no mass, hemorrhage or extra-axial collection. The size and configuration of the ventricles and extra-axial CSF spaces are normal. The brain parenchyma is normal, without acute or chronic infarction. Vascular: No abnormal hyperdensity of the major intracranial arteries or dural venous sinuses. No intracranial atherosclerosis. Skull: The visualized skull base, calvarium and extracranial soft tissues are normal. Sinuses/Orbits: No fluid levels or advanced mucosal thickening of the visualized paranasal sinuses. No mastoid or middle ear effusion. The orbits are normal. IMPRESSION: No acute intracranial abnormality. Electronically Signed   By: Ulyses Jarred M.D.   On: 02/09/2022 01:12    Procedures Procedures    Medications Ordered in ED Medications  sodium chloride 0.9 % bolus 500 mL (0 mLs Intravenous Stopped 02/09/22 0147)    ED Course/ Medical Decision Making/ A&P  Medical Decision Making Amount and/or Complexity of Data Reviewed External Data Reviewed: labs, ECG and notes. Labs: ordered. Decision-making details documented in ED Course. Radiology: ordered and independent interpretation performed. Decision-making details documented in ED Course. ECG/medicine tests: ordered and independent interpretation performed. Decision-making details documented in ED Course.   Presents to the emergency for evaluation of generalized  weakness with dizziness.  Patient reports that symptoms have been present throughout the day.  Patient has had poor oral intake including very little liquid intake today.  She has not had any vomiting.  Abdominal exam is benign, no pain or tenderness.  Examination is unremarkable.  No focal neurodeficits.  Neurologic exam essentially normal.  Patient noted to be bradycardic.  Reviewing her prior records at primary care and other visits, this is chronic for her.  She had moderate hypertension.  This is also chronic.  Lab work is entirely normal.  No signs of infection.  No anemia.  Patient had a head CT which was unremarkable.  She was given IV fluids and has had improvement.  She has been ambulatory here in the hallway with her walker which is usual for her.  She has not had any difficulty.  With no focal deficits, does not require MRI or further work-up.  Will discharge, primary care follow-up.        Final Clinical Impression(s) / ED Diagnoses Final diagnoses:  Dehydration  Generalized weakness    Rx / DC Orders ED Discharge Orders     None         Shenique Childers, Gwenyth Allegra, MD 02/09/22 [phone]

## 2022-02-09 NOTE — Assessment & Plan Note (Signed)
Chronic Lab Results  Component Value Date   HGBA1C 7.5 (H) 01/26/2022   Sugars have been well-controlled for her age She does monitor her sugars closely and she will let me know if they are on the low side Continue Trulicity 3 mg daily Continue NovoLog sliding scale if needed

## 2022-02-09 NOTE — Assessment & Plan Note (Signed)
Chronic Blood pressure well-controlled-lower today than it has been, which could be related to mild dehydration No changes today She will start monitoring her blood pressure daily at home Continue hydrochlorothiazide 12.5 mg daily as needed, lisinopril 20 mg daily

## 2022-02-09 NOTE — Assessment & Plan Note (Addendum)
Acute Resolved since emergency room/yesterday-improved after IV fluids, still feeling weak Reviewed test from emergency room-symptom thought to be related to dehydration She is pushing more fluids and stressed drinking more water on a regular basis Advise monitoring sugar, blood pressure in addition to increasing her fluids She has lost a little bit of weight and if she continues to skip lunch we may need to adjust her medication-will try to eat lunch or snack midday these next few days to make sure that is not the cause

## 2022-02-12 ENCOUNTER — Other Ambulatory Visit: Payer: Self-pay | Admitting: Podiatry

## 2022-02-12 DIAGNOSIS — L02611 Cutaneous abscess of right foot: Secondary | ICD-10-CM

## 2022-02-14 ENCOUNTER — Other Ambulatory Visit: Payer: Self-pay | Admitting: Internal Medicine

## 2022-02-15 ENCOUNTER — Telehealth: Payer: Self-pay | Admitting: Internal Medicine

## 2022-02-15 NOTE — Telephone Encounter (Signed)
Called patient to inform her about the recommendations in drinking Pedialyte. Patient understood recommendations

## 2022-02-15 NOTE — Telephone Encounter (Signed)
Patient called and said that she was in the hospital last week and she was dehydrated, They told her to take pedialyte and she said she did and said she doesn't have anymore but that her son told her to call us because he thought she probably needed to stop taking it so it doesn't throw off her levels. She said she is confused about it and is not sure what she needs to do. Call back is [phone]. She wanted to know if she can please get a call back today

## 2022-02-16 DIAGNOSIS — H401112 Primary open-angle glaucoma, right eye, moderate stage: Secondary | ICD-10-CM | POA: Diagnosis not present

## 2022-02-16 DIAGNOSIS — H524 Presbyopia: Secondary | ICD-10-CM | POA: Diagnosis not present

## 2022-02-16 DIAGNOSIS — R6889 Other general symptoms and signs: Secondary | ICD-10-CM | POA: Diagnosis not present

## 2022-03-16 ENCOUNTER — Telehealth: Payer: Self-pay | Admitting: Internal Medicine

## 2022-03-16 ENCOUNTER — Other Ambulatory Visit: Payer: Self-pay

## 2022-03-16 DIAGNOSIS — E1142 Type 2 diabetes mellitus with diabetic polyneuropathy: Secondary | ICD-10-CM

## 2022-03-16 MED ORDER — BLOOD GLUCOSE MONITOR KIT: PACK | 0 refills | Status: AC

## 2022-03-16 NOTE — Telephone Encounter (Signed)
Patient called and states she needs the accu-check meter as she is completely out and cannot check her blood sugar, she would like it sent to the [facility] she said on [address], she was not completely sure, patient can be reached at [phone].

## 2022-03-16 NOTE — Telephone Encounter (Signed)
Meter sent in today

## 2022-03-21 ENCOUNTER — Encounter: Payer: Self-pay | Admitting: Podiatry

## 2022-03-21 ENCOUNTER — Ambulatory Visit: Payer: Medicare HMO | Admitting: Podiatry

## 2022-03-21 VITALS — BP 165/76

## 2022-03-21 DIAGNOSIS — E1142 Type 2 diabetes mellitus with diabetic polyneuropathy: Secondary | ICD-10-CM

## 2022-03-21 DIAGNOSIS — M79674 Pain in right toe(s): Secondary | ICD-10-CM | POA: Diagnosis not present

## 2022-03-21 DIAGNOSIS — B351 Tinea unguium: Secondary | ICD-10-CM | POA: Diagnosis not present

## 2022-03-21 DIAGNOSIS — M79675 Pain in left toe(s): Secondary | ICD-10-CM

## 2022-03-21 NOTE — Progress Notes (Signed)
  Subjective:  Patient ID: Rebecca [name], female    DOB: [birth year],  MRN: [id number]  Rebecca [name] presents to clinic today for at risk foot care with history of diabetic neuropathy and painful thick toenails that are difficult to trim. Pain interferes with ambulation. Aggravating factors include wearing enclosed shoe gear. Pain is relieved with periodic professional debridement.  Chief Complaint  Patient presents with   Nail Problem    [facility] BS-97 A1C-7.? PCP-[name], Rebecca PCP VST-02/2022   New problem(s): None.   PCP is Rebecca Rail, MD.  Allergies  Allergen Reactions   Codeine     "Makes me drunk"    Review of Systems: Negative except as noted in the HPI.  Objective:   Vitals:   03/21/22 1714  BP: (!) 165/76   Rebecca [name] is a pleasant 89 y.o. female in NAD. AAO x 3.  Vascular Examination: CFT <4 seconds b/l LE. Faintly palpable DP pulses b/l LE. Faintly palpable PT pulse(s) b/l LE. Pedal hair absent. No pain with calf compression b/l. +2 pitting edema BLE. No ischemia or gangrene noted b/l LE. No cyanosis or clubbing noted b/l LE.  Dermatological Examination: Pedal skin is warm and supple b/l LE. No open wounds b/l LE. No interdigital macerations noted b/l LE. Toenails 1-5 left, R hallux, R 3rd toe, R 4th toe, and R 5th toe elongated, discolored, dystrophic, thickened, and crumbly with subungual debris and tenderness to dorsal palpation.   Anonychia noted right 2nd toe. Nailbed remains intact.  Incurvated nailplate b/l lower extremities with tenderness to palpation. No erythema, no edema, no drainage noted. No fluctuance.   Musculoskeletal Examination: Muscle strength 5/5 to all lower extremity muscle groups bilaterally. Severely collapsed flatfoot of the left lower extremity. Wearing appropriate fitting shoe gear. Utilizes rollator for ambulation assistance.  Neurological Examination: Pt has subjective symptoms of neuropathy. Protective  sensation intact 5/5 intact bilaterally with 10g monofilament b/l. Vibratory sensation diminished b/l.  Assessment/Plan: 1. Pain due to onychomycosis of toenails of both feet   2. Diabetic peripheral neuropathy associated with type 2 diabetes mellitus ([place])     -Patient was evaluated and treated. All patient's and/or POA's questions/concerns answered on today's visit. -Continue foot and shoe inspections daily. Monitor blood glucose per PCP/Endocrinologist's recommendations. -Continue supportive shoe gear daily. -Mycotic toenails 1-5 bilaterally were debrided in length and girth with sterile nail nippers and dremel without incident. -Patient/POA to call should there be question/concern in the interim.   Return in about 3 months (around 06/20/2022).  Rebecca [name], DPM

## 2022-03-28 DIAGNOSIS — H524 Presbyopia: Secondary | ICD-10-CM | POA: Diagnosis not present

## 2022-03-28 DIAGNOSIS — H1045 Other chronic allergic conjunctivitis: Secondary | ICD-10-CM | POA: Diagnosis not present

## 2022-03-28 DIAGNOSIS — R6889 Other general symptoms and signs: Secondary | ICD-10-CM | POA: Diagnosis not present

## 2022-03-28 DIAGNOSIS — H401132 Primary open-angle glaucoma, bilateral, moderate stage: Secondary | ICD-10-CM | POA: Diagnosis not present

## 2022-03-30 DIAGNOSIS — H524 Presbyopia: Secondary | ICD-10-CM | POA: Diagnosis not present

## 2022-04-04 ENCOUNTER — Telehealth: Payer: Self-pay | Admitting: Internal Medicine

## 2022-04-04 ENCOUNTER — Other Ambulatory Visit: Payer: Self-pay

## 2022-04-04 MED ORDER — TRULICITY 3 MG/0.5ML ~~LOC~~ SOAJ: 3.0000 mg | SUBCUTANEOUS | 0 refills | Status: DC

## 2022-04-04 NOTE — Telephone Encounter (Signed)
Faxed in today. 

## 2022-04-04 NOTE — Telephone Encounter (Signed)
MEDICATION: Dulaglutide (TRULICITY) 3 RF/1.6BW SOPN   PHARMACY: [facility], [place]  Comments:   **Let patient know to contact pharmacy at the end of the day to make sure medication is ready. **  ** Please notify patient to allow 48-72 hours to process**  **Encourage patient to contact the pharmacy for refills or they can request refills through [facility]**

## 2022-04-19 ENCOUNTER — Other Ambulatory Visit: Payer: Self-pay

## 2022-04-19 ENCOUNTER — Other Ambulatory Visit: Payer: Self-pay | Admitting: Internal Medicine

## 2022-05-10 ENCOUNTER — Other Ambulatory Visit: Payer: Self-pay

## 2022-05-10 DIAGNOSIS — E119 Type 2 diabetes mellitus without complications: Secondary | ICD-10-CM

## 2022-05-10 NOTE — Patient Outreach (Signed)
Aging Gracefully Program  05/10/2022  Rebecca [name] [facility] 11-02-1932 [id number]   [facility] Evaluation Interviewer made contact with patient. Aging Gracefully 9 month survey completed.    [place] Management Assistant [phone]

## 2022-05-11 ENCOUNTER — Telehealth: Payer: Self-pay | Admitting: *Deleted

## 2022-05-11 IMAGING — DX DG CHEST 1V PORT
1 series · 1 of 1 positions shown · non-contrast
Comparison: None.

CLINICAL DATA: Weakness

EXAM:
PORTABLE CHEST 1 VIEW

[chest ap]
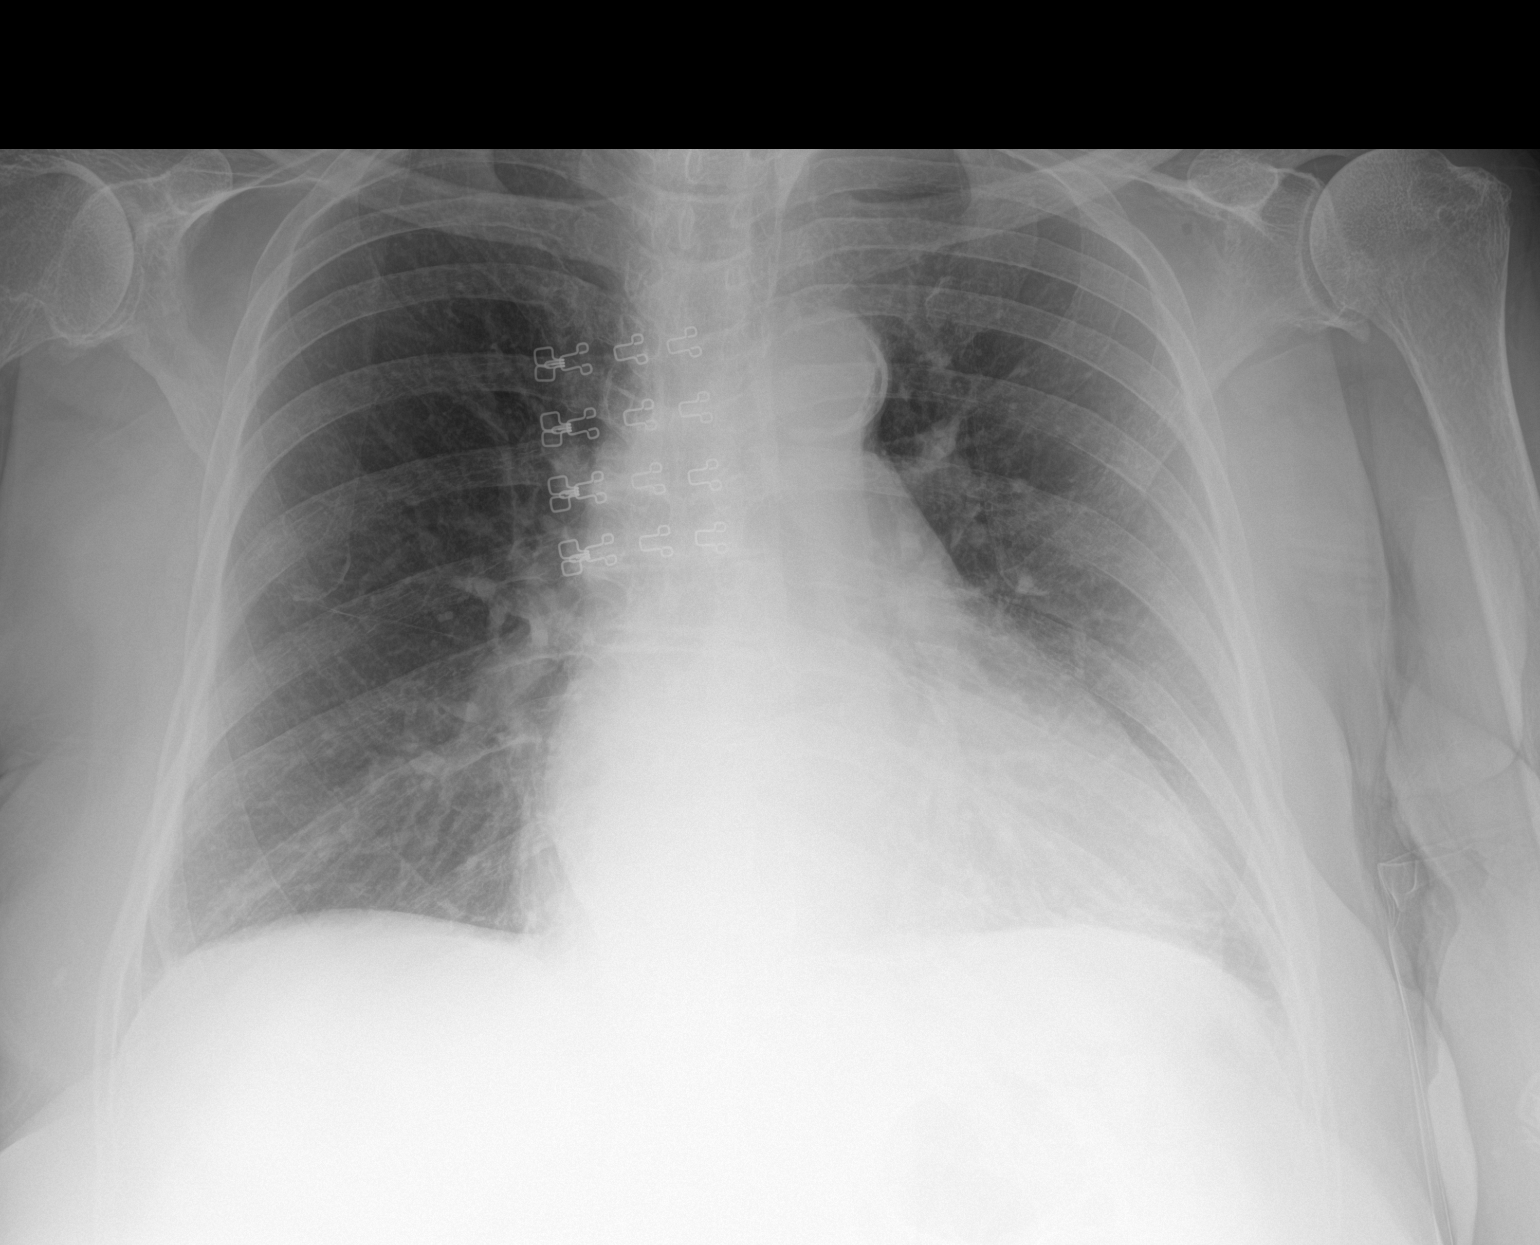

[1 of 1 positions shown; findings below may reference images not displayed]

FINDINGS: The heart size and mediastinal contours are mildly enlarged. Aortic
knob calcifications are seen. There is prominence of the central
pulmonary vasculature. Both lungs are clear. The visualized skeletal
structures are unremarkable.
IMPRESSION: Mild cardiomegaly and pulmonary vascular congestion.

## 2022-05-11 NOTE — Progress Notes (Unsigned)
  Care Coordination  Outreach Note  05/11/2022 Name: Rebecca [name] MRN: [id number] DOB: June 19, 1932   Care Coordination Outreach Attempts: An unsuccessful telephone outreach was attempted today to offer the patient information about available care coordination services as a benefit of their health plan.   Follow Up Plan:  Additional outreach attempts will be made to offer the patient care coordination information and services.   Encounter Outcome:  No Answer  Julian Hy, [place] Direct Dial: [phone]

## 2022-05-13 IMAGING — DX DG CHEST 1V PORT
1 series · 1 of 1 positions shown · non-contrast
Comparison: Prior chest x-ray 01/02/2020

CLINICAL DATA: 87-year-old female with shortness of breath

EXAM:
PORTABLE CHEST 1 VIEW

[chest ap]
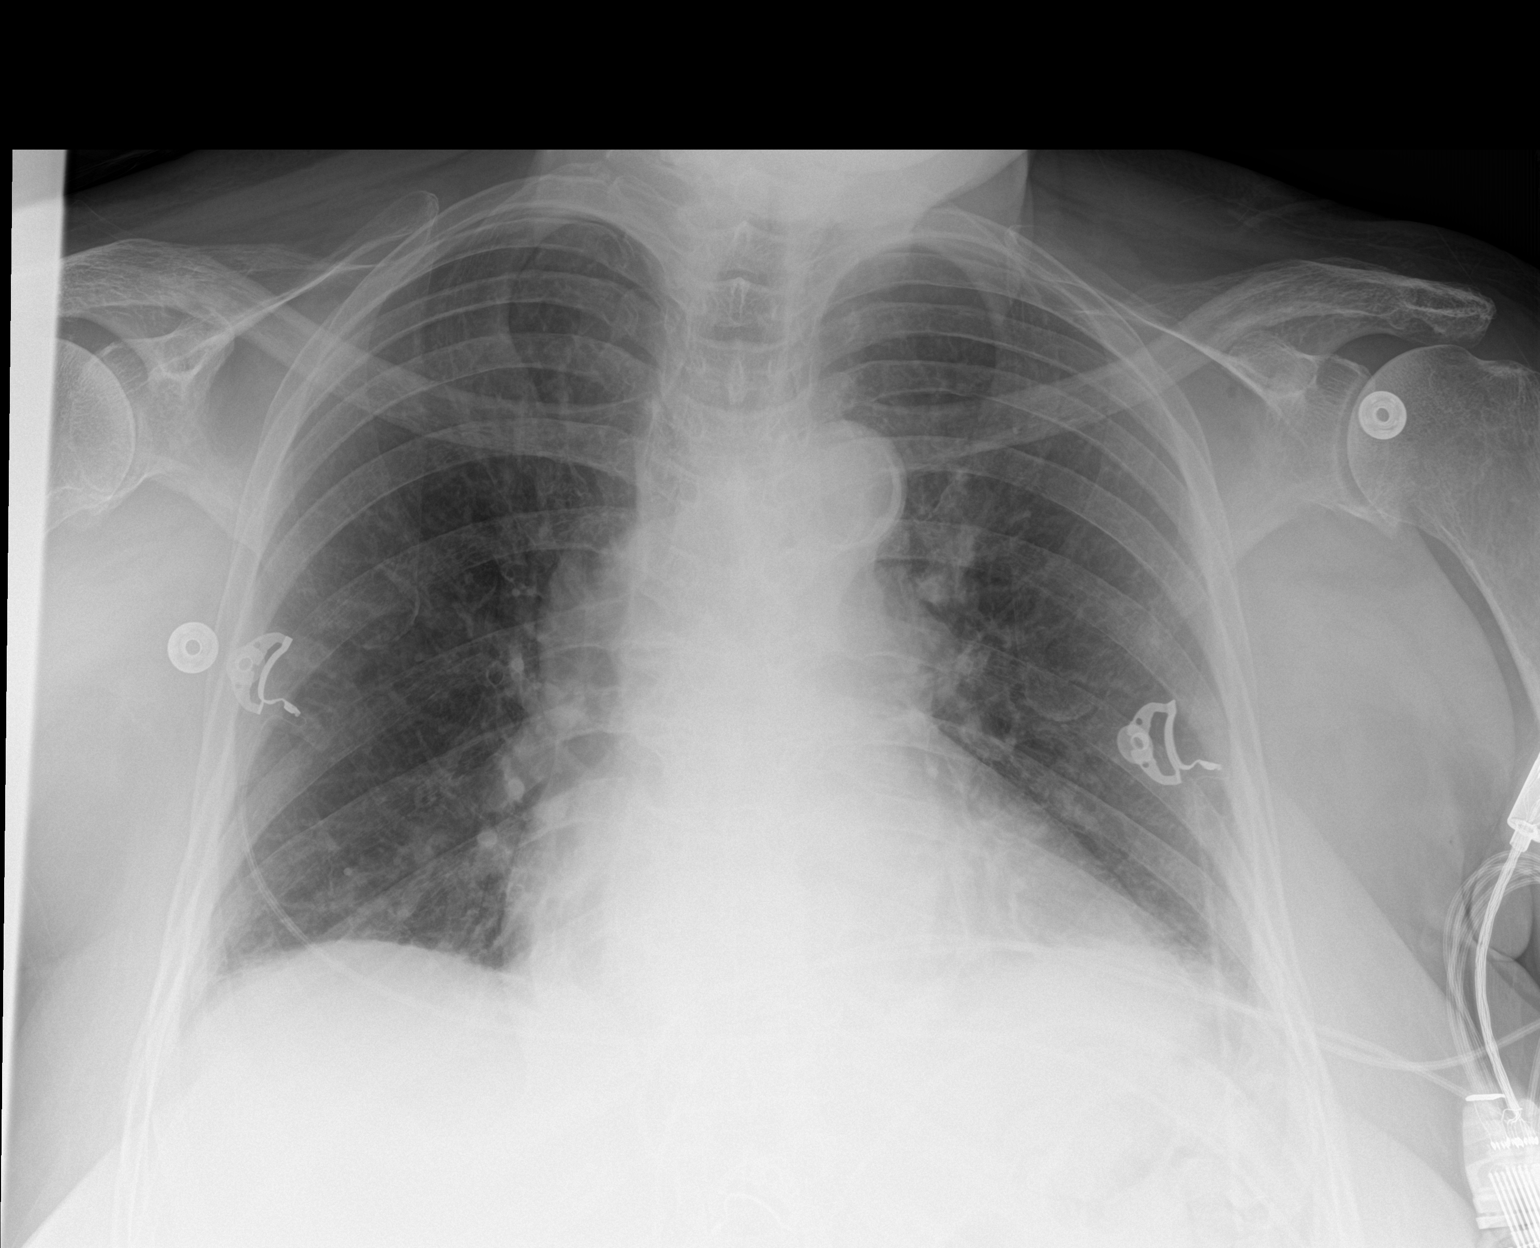

[1 of 1 positions shown; findings below may reference images not displayed]

FINDINGS: Increasing streaky airspace opacities in the left lower lobe
partially obscuring the left hemidiaphragm. Stable cardiomegaly.
Atherosclerotic calcifications again noted in the transverse aorta.
No pulmonary edema, large pleural effusion or pneumothorax. Minimal
atelectasis versus scarring in the right lung base, unchanged. No
acute osseous abnormality.
IMPRESSION: 1. Increasing streaky airspace opacities in the left lower lobe
which may reflect atelectasis and/or infiltrate.
2. Stable cardiomegaly.
3. Aortic calcifications.

## 2022-05-14 IMAGING — DX DG CHEST 1V PORT
1 series · 1 of 1 positions shown · non-contrast
Comparison: January 04, 2020

CLINICAL DATA: Hypertension and lower extremity edema. Shortness of
breath.

EXAM:
PORTABLE CHEST 1 VIEW

[chest ap]
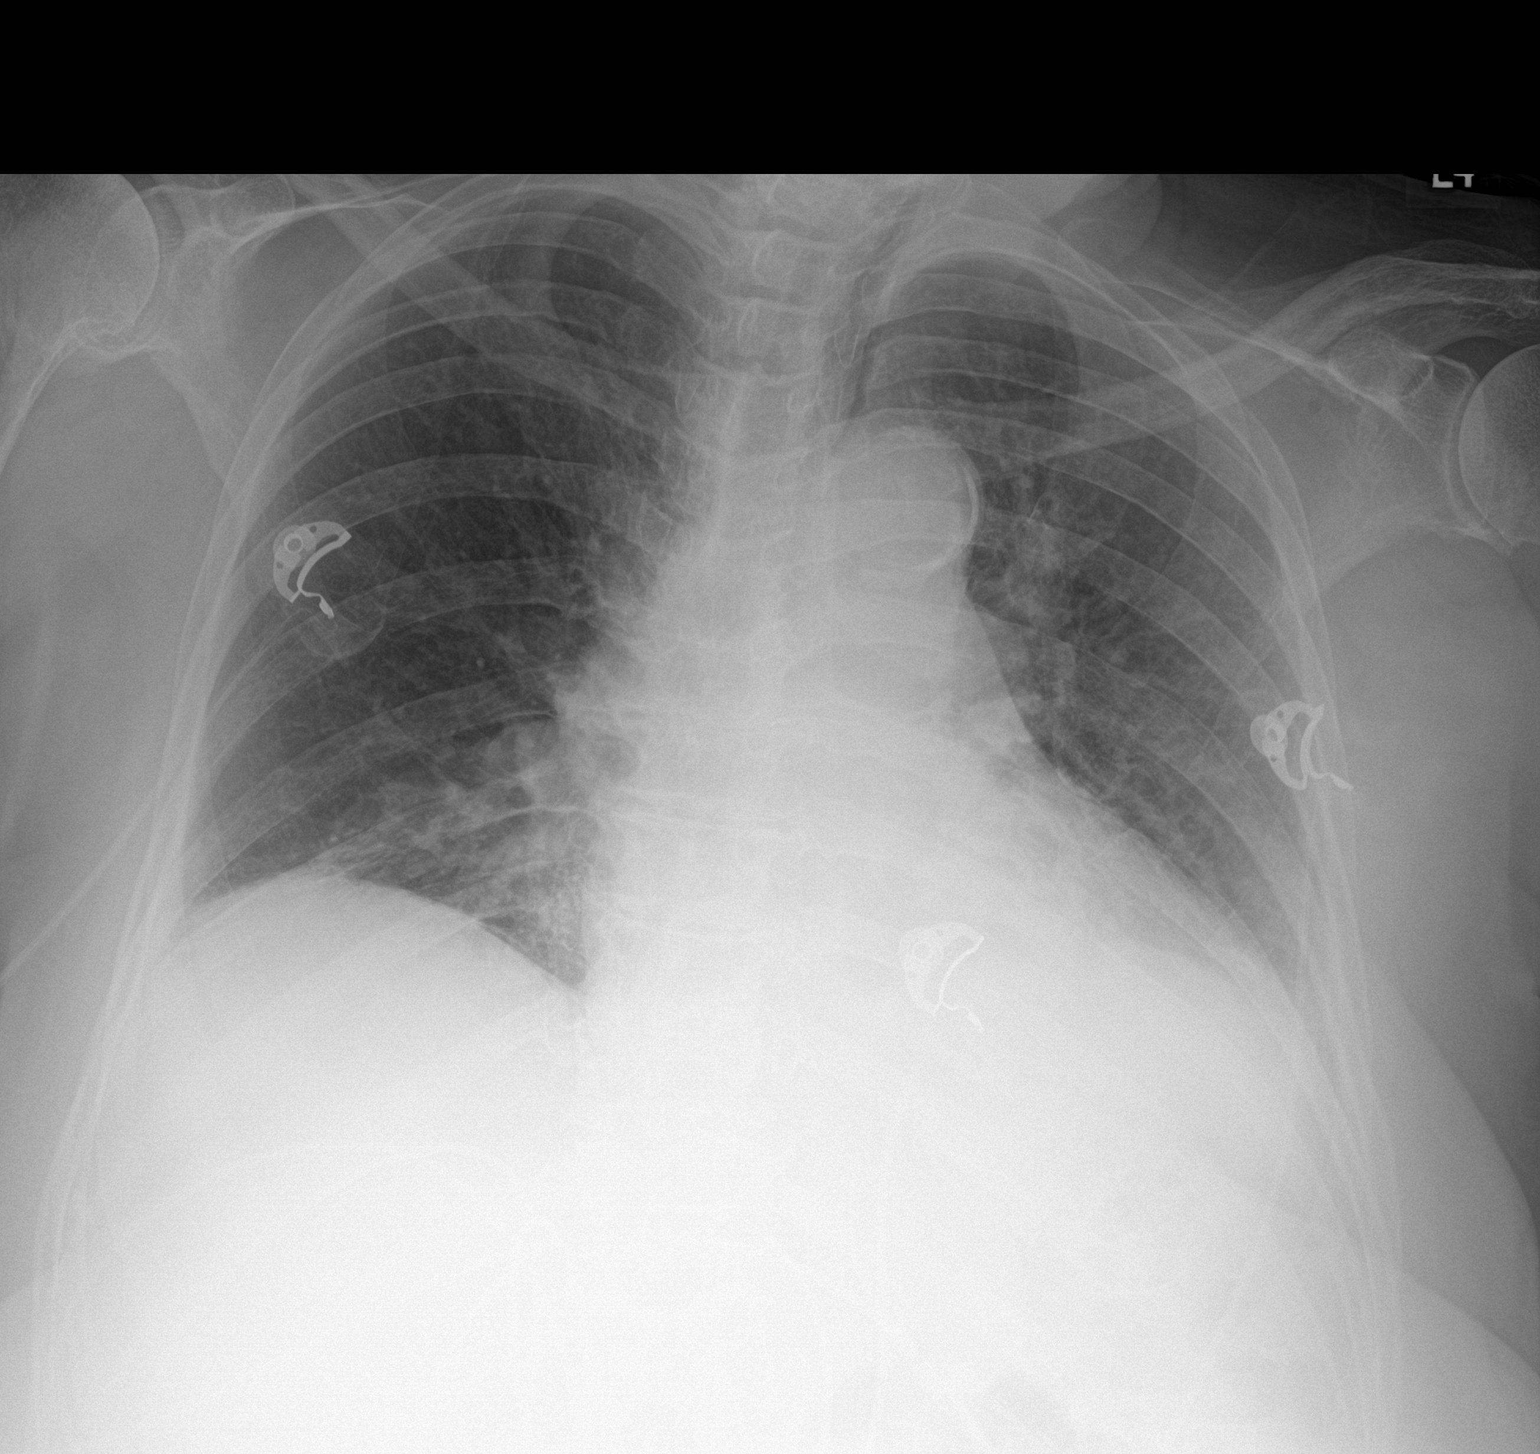

[1 of 1 positions shown; findings below may reference images not displayed]

FINDINGS: There is bibasilar atelectasis. There is subtle consolidation medial
left base. Lungs elsewhere are clear. Heart is upper normal in size
with pulmonary vascularity normal. No adenopathy. There is aortic
atherosclerosis. No bone lesions.
IMPRESSION: Subtle consolidation medial left base, likely developing pneumonia.
There is also bibasilar atelectasis. Lungs elsewhere clear. Stable
cardiac prominence. No adenopathy evident.

Aortic Atherosclerosis ([id number]).

## 2022-05-14 NOTE — Progress Notes (Signed)
  Care Coordination   Note   05/14/2022 Name: Rebecca [name] MRN: [id number] DOB: Sep 11, 1932  [name] is a 89 y.o. year old female who sees Burns, [name], MD for primary care. I reached out to [employer] by phone today to offer care coordination services.  [name] was given information about Care Coordination services today including:   The Care Coordination services include support from the care team which includes your Nurse Coordinator, Clinical Social Worker, or Pharmacist.  The Care Coordination team is here to help remove barriers to the health concerns and goals most important to you. Care Coordination services are voluntary, and the patient may decline or stop services at any time by request to their care team member.   Care Coordination Consent Status: Patient agreed to services and verbal consent obtained.   Follow up plan:  Telephone appointment with care coordination team member scheduled for:  05/18/2022  Encounter Outcome:  Pt. Scheduled from referral   Julian Hy, [place] Direct Dial: [phone]

## 2022-05-17 ENCOUNTER — Ambulatory Visit
Admission: RE | Admit: 2022-05-17 | Discharge: 2022-05-17 | Disposition: A | Discharge status: Home / Self Care | Payer: Medicare HMO | Source: Ambulatory Visit | Attending: Internal Medicine | Admitting: Internal Medicine

## 2022-05-17 DIAGNOSIS — Z780 Asymptomatic menopausal state: Secondary | ICD-10-CM

## 2022-05-17 DIAGNOSIS — M8589 Other specified disorders of bone density and structure, multiple sites: Secondary | ICD-10-CM | POA: Diagnosis not present

## 2022-05-17 DIAGNOSIS — R6889 Other general symptoms and signs: Secondary | ICD-10-CM | POA: Diagnosis not present

## 2022-05-17 IMAGING — US US THYROID
1 series · 14 of 24 positions shown · non-contrast
Comparison: 06/20/2006

CLINICAL DATA: Thyroid storm.

EXAM:
THYROID ULTRASOUND
TECHNIQUE: Ultrasound examination of the thyroid gland and adjacent soft
tissues was performed.

[Series 1: us thyroid · 24 acquisitions, 14 frames shown]
[im 1/24]
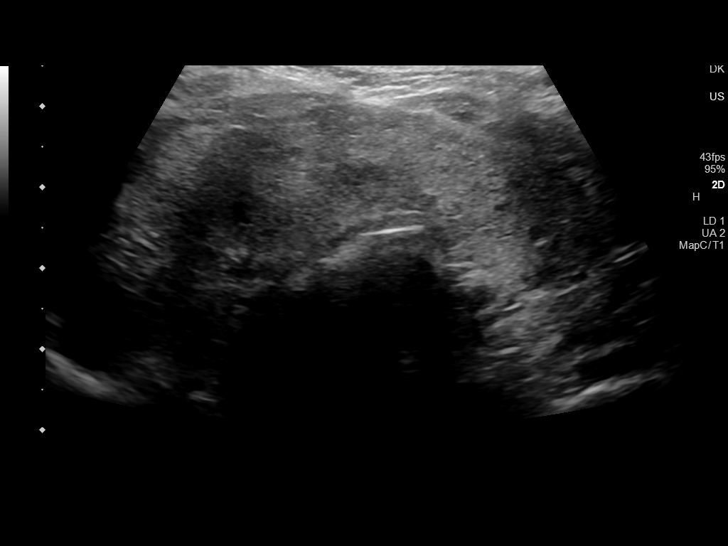
[im 3/24]
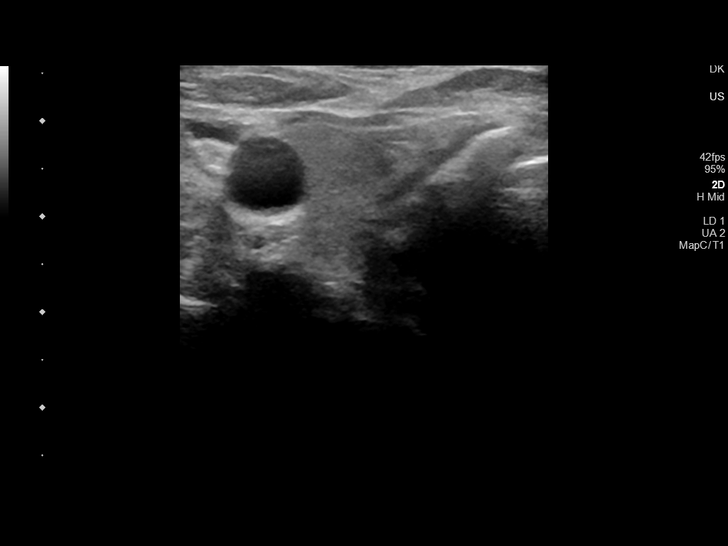
[im 5/24]
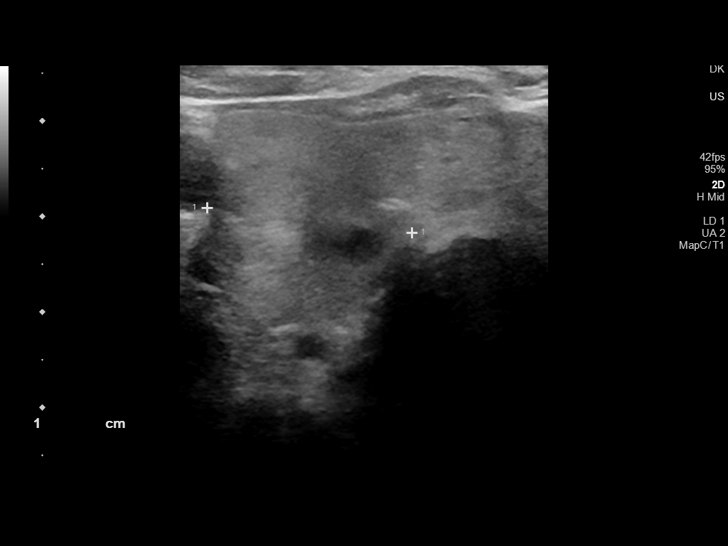
[im 7/24]
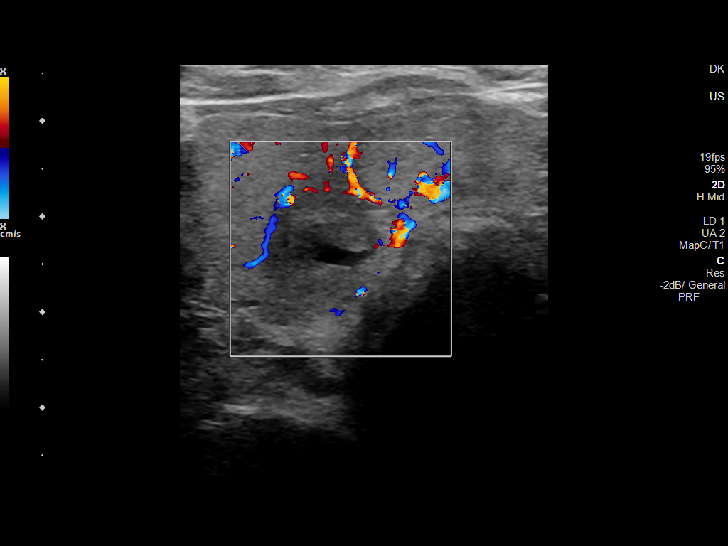
[im 8/24]
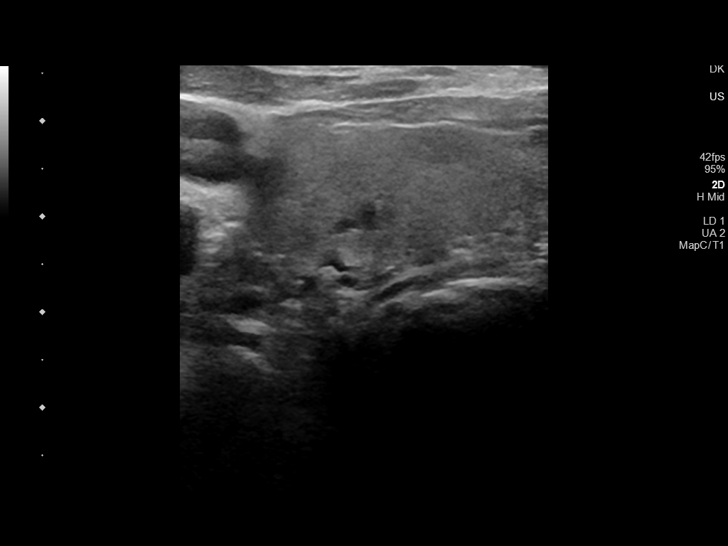
[im 10/24]
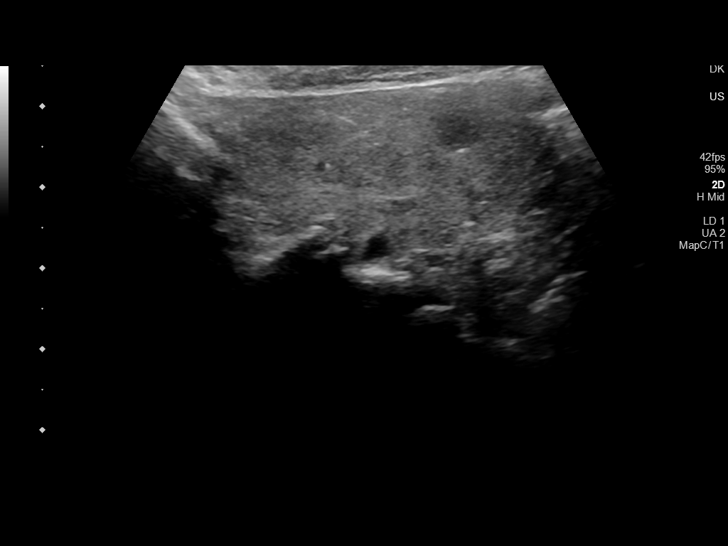
[im 12/24]
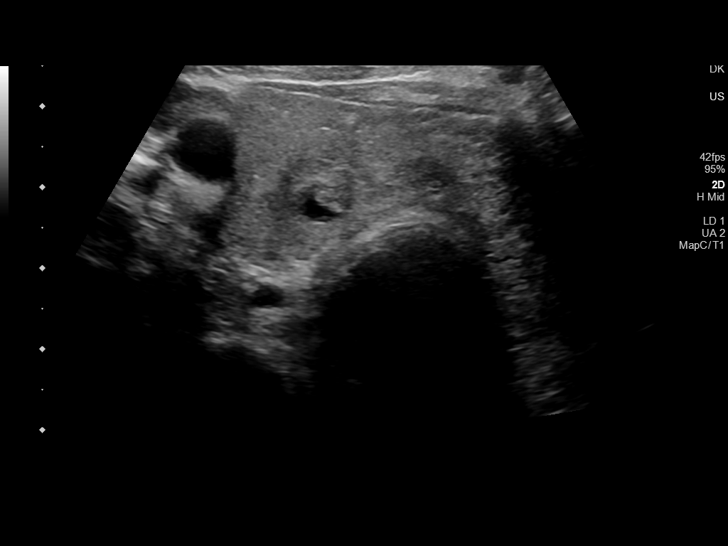
[im 13/24]
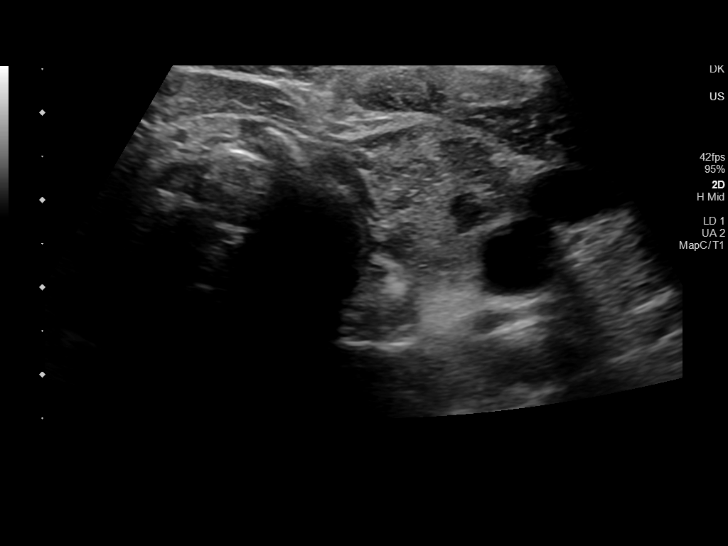
[im 15/24]
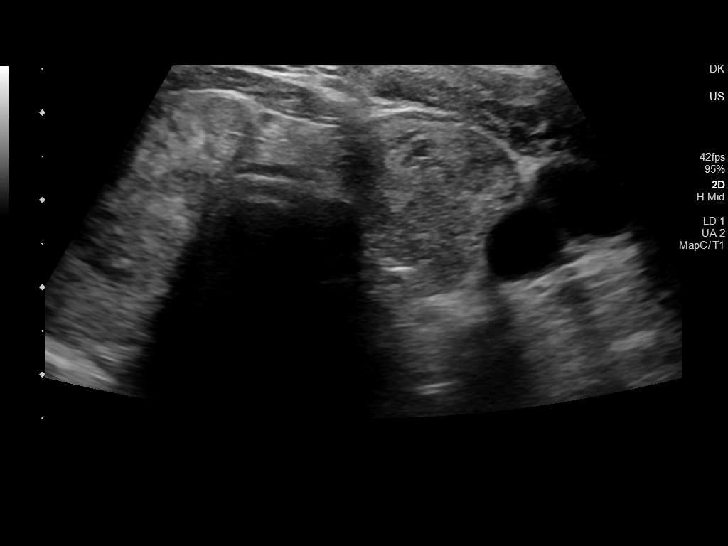
[im 17/24]
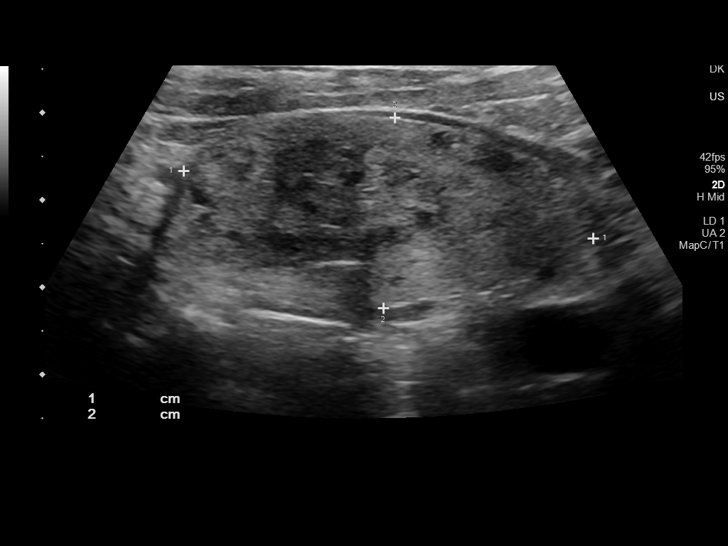
[im 19/24]
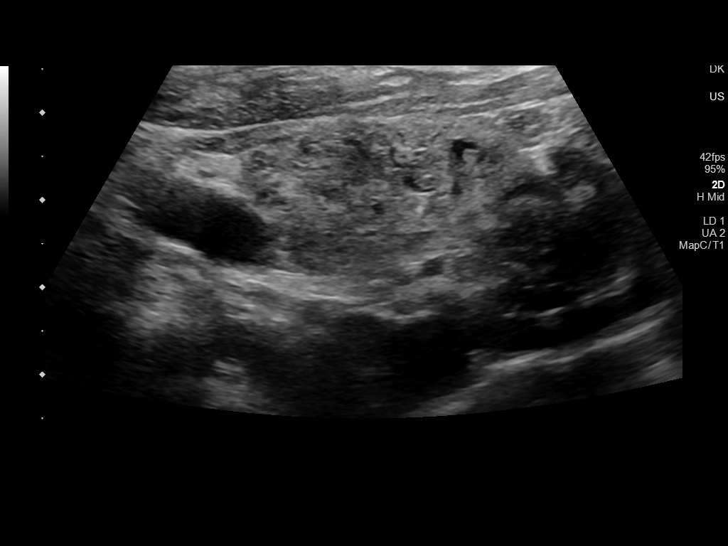
[im 20/24]
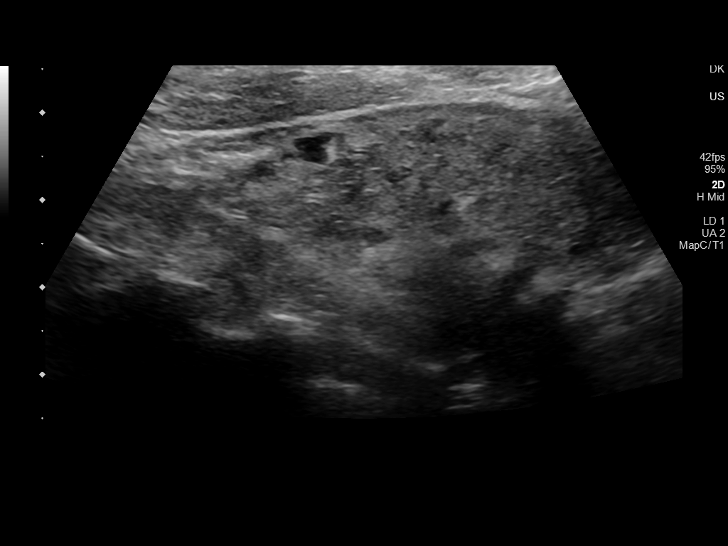
[im 22/24]
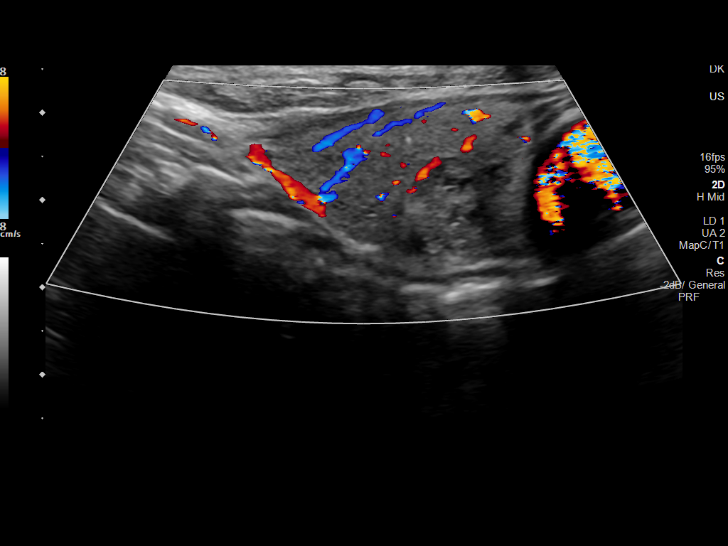
[im 24/24]
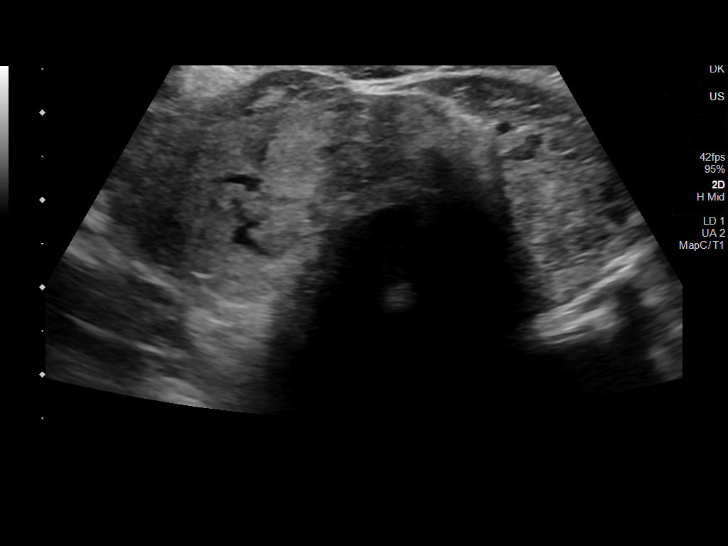

[14 of 24 positions shown; findings below may reference images not displayed]

FINDINGS: Parenchymal Echotexture: Markedly heterogenous

Isthmus:

Right lobe: 3.4 x 1.6 x 2.2 cm

Left lobe: 4.8 x 2.2 x 2 cm

_________________________________________________________

Estimated total number of nodules >/= 1 cm: 0

Number of spongiform nodules >/=  2 cm not described below (TR1): 0

Number of mixed cystic and solid nodules >/= 1.5 cm not described
below (TR2): 0

_________________________________________________________

No discrete nodules are seen within the thyroid gland.
IMPRESSION: Markedly heterogeneous thyroid gland without evidence for distinct
thyroid nodule.

The above is in keeping with the ACR TI-RADS recommendations - [HOSPITAL] 4087;[DATE].

## 2022-05-18 ENCOUNTER — Ambulatory Visit: Payer: Self-pay

## 2022-05-18 DIAGNOSIS — Z139 Encounter for screening, unspecified: Secondary | ICD-10-CM

## 2022-05-18 NOTE — Patient Outreach (Signed)
  Care Coordination   Follow Up Visit Note   05/18/2022 Name: Rebecca [name] MRN: [id number] DOB: Mar 19, 1932  Rebecca [name] is a 89 y.o. year old female who sees Burns, [name], MD for primary care. I spoke with  Rebecca [name] by phone today.  What matters to the patients health and wellness today?  Rebecca [name] reports she lives alone. She states her son assist her as needed, but has limited availability because he work. She reports she gets her medications delivered and she gets meals on wheels. She reports she has a crack in her wall and would like to know if someone can assist with repairing the crack. She reports history of diabetes and states blood sugar 85 today, but states never goes below 70. She states she does not have a blood pressure monitor.  Goals Addressed             This Visit's Progress    Care Coordination activities       Interventions Today    Flowsheet Row Most Recent Value  Chronic Disease   Chronic disease during today's visit Hypertension (HTN), Diabetes  General Interventions   General Interventions Discussed/Reviewed General Interventions Discussed, Durable Medical Equipment (DME), Doctor Visits  Doctor Visits Discussed/Reviewed Doctor Visits Discussed, PCP  Durable Medical Equipment (DME) Gilford Rile, BP Cuff  Education Interventions   Education Provided Provided Education  [encouraged to continue to check BS as recommended by provider. encouraged to continue to eat healthy. discussed CGM(patient is interested),  instructed to contact provider with health questions or concerns.]  Provided Verbal Education On [employer], When to see the doctor, Blood Sugar Monitoring  [encouraged to contact insurance [employer], to discuss over the counter benefits.]  Pharmacy Interventions   Pharmacy Dicussed/Reviewed Pharmacy Topics Discussed            SDOH assessments and interventions completed:  Yes  SDOH Interventions Today    Flowsheet Row  Most Recent Value  SDOH Interventions   Food Insecurity Interventions Intervention Not Indicated  Housing Interventions Other (Comment)  [care guide referral]  Transportation Interventions Intervention Not Indicated  [states uses [employer]'s transportation]  Utilities Interventions Intervention Not Indicated     Care Coordination Interventions:  Yes, provided   Follow up plan: Follow up call scheduled for 05/30/22    Encounter Outcome:  Pt. Visit Completed   Thea Silversmith, RN, MSN, BSN, [place] Coordinator [phone]

## 2022-05-18 NOTE — Patient Instructions (Addendum)
Visit Information  Thank you for taking time to visit with me today. Please don't hesitate to contact me if I can be of assistance to you.   Following are the goals we discussed today:   Goals Addressed             This Visit's Progress    Care Coordination activities       Interventions Today    Flowsheet Row Most Recent Value  Chronic Disease   Chronic disease during today's visit Hypertension (HTN), Diabetes  General Interventions   General Interventions Discussed/Reviewed General Interventions Discussed, Durable Medical Equipment (DME), Doctor Visits  Doctor Visits Discussed/Reviewed Doctor Visits Discussed, PCP  Durable Medical Equipment (DME) Gilford Rile, BP Cuff  Education Interventions   Education Provided Provided Education  [encouraged to continue to check BS as recommended by provider. encouraged to continue to eat healthy. discussed CGM(patient is interested),  instructed to contact provider with health questions or concerns.]  Provided Verbal Education On Insurance Plans, When to see the doctor, Blood Sugar Monitoring  [encouraged to contact insurance [employer], to discuss over the counter benefits.]  Pharmacy Interventions   Pharmacy Dicussed/Reviewed Pharmacy Topics Discussed            Our next appointment is by telephone on 05/30/22 at 11:30 am  Please call the care guide team at [phone] if you need to cancel or reschedule your appointment.   If you are experiencing a Mental Health or [place] or need someone to talk to, please call the Suicide and Crisis Lifeline: [place], RN, MSN, BSN, [facility] [phone]

## 2022-05-19 ENCOUNTER — Encounter: Payer: Self-pay | Admitting: Internal Medicine

## 2022-05-19 DIAGNOSIS — M8580 Other specified disorders of bone density and structure, unspecified site: Secondary | ICD-10-CM | POA: Insufficient documentation

## 2022-05-25 ENCOUNTER — Other Ambulatory Visit: Payer: Self-pay | Admitting: Internal Medicine

## 2022-05-25 ENCOUNTER — Other Ambulatory Visit: Payer: Self-pay

## 2022-05-30 ENCOUNTER — Telehealth: Payer: Self-pay

## 2022-05-30 ENCOUNTER — Telehealth: Payer: Self-pay | Admitting: *Deleted

## 2022-05-30 NOTE — Telephone Encounter (Signed)
   Telephone encounter was:  Unsuccessful.  05/30/2022 Name: Rebecca [name] MRN: [id number] DOB: 1932/05/26  Unsuccessful outbound call made today to assist with:  Home Modifications  Outreach Attempt:  1st Attempt  A HIPAA compliant voice message was left requesting a return call.  Instructed patient to call back at [phone].  [place] [phone] [address] , [place] [address] Email : [name]

## 2022-05-30 NOTE — Patient Outreach (Signed)
  Care Coordination   05/30/2022 Name: Rebecca [name] MRN: [id number] DOB: 10/20/1932   Care Coordination Outreach Attempts:  An unsuccessful telephone outreach was attempted today to offer the patient information about available care coordination services as a benefit of their health plan.   Follow Up Plan:  Additional outreach attempts will be made to offer the patient care coordination information and services.   Encounter Outcome:  No Answer   Care Coordination Interventions:  No, not indicated    Thea Silversmith, RN, MSN, BSN, [place] Coordinator [phone]

## 2022-05-31 DIAGNOSIS — E113593 Type 2 diabetes mellitus with proliferative diabetic retinopathy without macular edema, bilateral: Secondary | ICD-10-CM | POA: Diagnosis not present

## 2022-05-31 DIAGNOSIS — H401133 Primary open-angle glaucoma, bilateral, severe stage: Secondary | ICD-10-CM | POA: Diagnosis not present

## 2022-05-31 DIAGNOSIS — H35373 Puckering of macula, bilateral: Secondary | ICD-10-CM | POA: Diagnosis not present

## 2022-05-31 DIAGNOSIS — R6889 Other general symptoms and signs: Secondary | ICD-10-CM | POA: Diagnosis not present

## 2022-05-31 DIAGNOSIS — H04123 Dry eye syndrome of bilateral lacrimal glands: Secondary | ICD-10-CM | POA: Diagnosis not present

## 2022-05-31 LAB — HM DIABETES EYE EXAM

## 2022-06-01 ENCOUNTER — Telehealth: Payer: Self-pay | Admitting: *Deleted

## 2022-06-01 NOTE — Telephone Encounter (Signed)
   Telephone encounter was:  Successful.  06/01/2022 Name: Rebecca [name] MRN: [id number] DOB: 1932/12/01  Rebecca [name] is a 89 y.o. year old female who is a primary care patient of Burns, [name], MD . The community resource team was consulted for assistance with Home Modifications Patient needing help with home repair  referred through [facility] Care guide performed the following interventions: Patient provided with information about care guide support team and interviewed to confirm resource needs.  Follow Up Plan:  No further follow up planned at this time. The patient has been provided with needed resources.  [place] [phone] [address] , [place] [address] Email : [name]

## 2022-06-04 ENCOUNTER — Ambulatory Visit: Payer: Self-pay

## 2022-06-04 DIAGNOSIS — Z139 Encounter for screening, unspecified: Secondary | ICD-10-CM

## 2022-06-04 NOTE — Patient Outreach (Signed)
  Care Coordination   Follow Up Visit Note   06/04/2022 Name: Rebecca [name] MRN: [id number] DOB: [birth year]  Rebecca [name] is a 89 y.o. year old female who sees Burns, [name], MD for primary care. I spoke with  Rebecca [name] by phone today.  What matters to the patients health and wellness today?  Rebecca [name] reports she was told that she may be losing some hours for in home assistance due to her income. She reports she receives services with [facility] through [place] service. She reports she has a son, but states he is not able to be there due to work long hours. has a supportive son, who works and is not able to be there all the time.  Goals Addressed             This Visit's Progress    Care Coordination activities       Interventions Today    Flowsheet Row Most Recent Value  Chronic Disease   Chronic disease during today's visit Diabetes, Hypertension (HTN)  General Interventions   General Interventions Discussed/Reviewed General Interventions Reviewed, Annual Eye Exam, Doctor Visits, Communication with, Community Resources  [upcoming appointment with eye doctor]  Doctor Visits Discussed/Reviewed Doctor Visits Discussed, Specialist, PCP  [upcoming appointment wtih Dr. Nicholes Mango) and Podiatrist]  Communication with Social Work  [per patient in home care- currently has 3hr/day 3 days/week., but states may be loosing some hours.discussed with Casimer Lanius, LCSW, patient in home care need concerns. referral to care guide to provide list for in home care agencies.]  Exercise Interventions   Exercise Discussed/Reviewed Physical Activity  [encouraged to remain as active as tolerated]  Physical Activity Discussed/Reviewed Physical Activity Discussed  [discussed [place] therapy. [name] states she will speak with provider about it at next visit. She states, she will call for a sooner appointment if she thinks that is needed.]  Education Interventions   Provided  Verbal Education On [employer]            SDOH assessments and interventions completed:  No  Care Coordination Interventions:  Yes, provided   Follow up plan: Follow up call scheduled for 07/05/22    Encounter Outcome:  Pt. Visit Completed

## 2022-06-04 NOTE — Patient Instructions (Signed)
Visit Information  Thank you for taking time to visit with me today. Please don't hesitate to contact me if I can be of assistance to you.   Following are the goals we discussed today:   Goals Addressed             This Visit's Progress    Care Coordination activities       Interventions Today    Flowsheet Row Most Recent Value  Chronic Disease   Chronic disease during today's visit Diabetes, Hypertension (HTN)  General Interventions   General Interventions Discussed/Reviewed General Interventions Reviewed, Annual Eye Exam, Doctor Visits, Communication with, Community Resources  [upcoming appointment with eye doctor]  Doctor Visits Discussed/Reviewed Doctor Visits Discussed, Specialist, PCP  [upcoming appointment wtih Dr. Nicholes Mango) and Podiatrist]  Communication with Social Work  [per patient in home care- currently has 3hr/day 3 days/week., but states may be loosing some hours.discussed with Casimer Lanius, LCSW, patient in home care need concerns. referral to care guide to provide list for in home care agencies.]  Exercise Interventions   Exercise Discussed/Reviewed Physical Activity  [encouraged to remain as active as tolerated]  Physical Activity Discussed/Reviewed Physical Activity Discussed  [discussed [place] therapy. Ms. Martus states she will speak with provider about it at next visit. She states, she will call for a sooner appointment if she thinks that is needed.]  Education Interventions   Provided Verbal Education On [place] next appointment is by telephone on 07/05/22 at 2:30 pm  Please call the care guide team at [phone] if you need to cancel or reschedule your appointment.   If you are experiencing a Mental Health or [place] or need someone to talk to, please call the Suicide and Crisis Lifeline: [place], RN, MSN, BSN, [place] [phone]

## 2022-06-05 ENCOUNTER — Telehealth: Payer: Self-pay

## 2022-06-05 NOTE — Telephone Encounter (Addendum)
   Telephone encounter was:  Successful.  06/05/2022 Name: Rebecca [name] MRN: [id number] DOB: 03-23-1932  Rebecca [name] is a 89 y.o. year old female who is a primary care patient of Burns, [name], MD . The community resource team was consulted for assistance with  in-home care list.  Care guide performed the following interventions: Spoke with patient verified home address to mail in-home care provider list. No further assistance needed at this time.  Follow Up Plan:  No further follow up planned at this time. The patient has been provided with needed resources.  [place] Resource Care Guide   ??[name]  ?? [id number]   Website: [email or web address]  [email or web address]

## 2022-06-12 ENCOUNTER — Telehealth: Payer: Self-pay | Admitting: Internal Medicine

## 2022-06-12 ENCOUNTER — Encounter (INDEPENDENT_AMBULATORY_CARE_PROVIDER_SITE_OTHER): Payer: Medicare HMO | Admitting: Ophthalmology

## 2022-06-12 DIAGNOSIS — R6889 Other general symptoms and signs: Secondary | ICD-10-CM | POA: Diagnosis not present

## 2022-06-12 DIAGNOSIS — I10 Essential (primary) hypertension: Secondary | ICD-10-CM

## 2022-06-12 DIAGNOSIS — H35372 Puckering of macula, left eye: Secondary | ICD-10-CM | POA: Diagnosis not present

## 2022-06-12 DIAGNOSIS — H35033 Hypertensive retinopathy, bilateral: Secondary | ICD-10-CM

## 2022-06-12 DIAGNOSIS — E113593 Type 2 diabetes mellitus with proliferative diabetic retinopathy without macular edema, bilateral: Secondary | ICD-10-CM

## 2022-06-12 DIAGNOSIS — H43813 Vitreous degeneration, bilateral: Secondary | ICD-10-CM | POA: Diagnosis not present

## 2022-06-12 NOTE — Telephone Encounter (Signed)
Patient would like a recommendation for a gynecologist. She said she was told she needs to get a pap smear done. [facility] does not accept her insurance. I suggested contacting her insurance company to find doctors that will take her insurance, but she would like a recommendation from Dr. Quay Burow. She would like a call back at [phone].

## 2022-06-13 ENCOUNTER — Ambulatory Visit: Payer: Self-pay | Admitting: Licensed Clinical Social Worker

## 2022-06-13 NOTE — Telephone Encounter (Signed)
Spoke with patient and recommendations given along with phone numbers:  [facility]

## 2022-06-13 NOTE — Patient Outreach (Signed)
  Care Coordination  Initial Visit Note   06/13/2022 Name: Rebecca [name] MRN: [id number] DOB: 22-Sep-1932  Rebecca [name] is a 89 y.o. year old female who sees Burns, [name], MD for primary care. I spoke with  Rebecca [name] by phone today.  What matters to the patients health and wellness today?  Losing her CAPS services  Patient is currently receiving CAPS services which is scheduled to end unless she is able to pay $1,000 per month or meet a monthly deductible.  Patient is unable to pay and does not understand why she has to pay.  LCSW collaborated with CAPS and DSS Medicaid to gather information to explain to patient.  Patient's Medicaid will transition to [facility] May 1st and will not cover CAPS or PCS services this will only cover part B of her Medicare.  CAPS RN will schedule a home visit with patient to end the services at the end of April.    Goals Addressed             This Visit's Progress    Care Coordination related to additional support in the home       Activities and task to complete in order to accomplish goals.   Your CAPS RN Rebecca [name] will call to schedule an appointment so that you can sign papers to end your CAPS services I will follow up with the Department of Social Services to see if you are still on the in-home aide wait list         SDOH assessments and interventions completed:  No   Care Coordination Interventions:  Yes, provided  Interventions Today    Flowsheet Row Most Recent Value  Chronic Disease   Chronic disease during today's visit Diabetes, Hypertension (HTN), Chronic Kidney Disease/End Stage Renal Disease (ESRD)  General Interventions   General Interventions Discussed/Reviewed General Interventions Discussed, Level of Care, Communication with  Communication with RN  [CAPS program RN,  and DHHS Medicaid Direct Rep]  Level of [place]  [CAPS program]  [place]  Rebecca [name] related to CAP hours, Solution focused, emotional support & and advocating]       Follow up plan:  LCSW will continue to collaborate with DSS and follow up with patient in 5 to 7 days    Encounter Outcome:  Pt. Visit Completed   Rebecca [name], [place] [phone]

## 2022-06-13 NOTE — Patient Instructions (Signed)
Visit Information  Thank you for taking time to visit with me today. Please don't hesitate to contact me if I can be of assistance to you.   Following are the goals we discussed today:   Goals Addressed             This Visit's Progress    Care Coordination related to additional support in the home       Activities and task to complete in order to accomplish goals.   Your CAPS RN Nira Conn will call to schedule an appointment so that you can sign papers to end your CAPS services I will follow up with the Department of Social Services to see if you are still on the in-home aide wait list          Please call the care guide team at [phone] if you need to cancel or reschedule your appointment.    The patient verbalized understanding of instructions, educational materials, and care plan provided today and DECLINED offer to receive copy of patient instructions, educational materials, and care plan.   Rebecca [name], [place] [phone]

## 2022-06-14 ENCOUNTER — Ambulatory Visit: Payer: Self-pay | Admitting: Licensed Clinical Social Worker

## 2022-06-14 NOTE — Patient Outreach (Signed)
  Care Coordination  Follow Up Visit Note   06/14/2022 Name: Rebecca [name] MRN: [id number] DOB: [birth year]  Rebecca [name] is a 89 y.o. year old female who sees Burns, [name], MD for primary care. I spoke with  Rebecca [name] by phone today.  What matters to the patients health and wellness today?  She wants answers about her CAP Services and Medicaid benefits.    Goals Addressed             This Visit's Progress    Care Coordination related to additional support in the home       Activities and task to complete in order to accomplish goals.   Your CAPS RN Nira Conn will call to schedule an appointment so that you can sign papers to end your CAPS services I will follow up with the Department of Social Services to see if you are still on the in-home aide wait list   I will continue to collaborate with Nira Conn to resolve barriers and get answers to your questions about CAPS Thanks for sharing the ref: [phone]  with me from your call at [place]        SDOH assessments and interventions completed:  No   Care Coordination Interventions:  Yes, provided   Follow up plan:  will collaborate with CAPS coordinator and f/u with patient afterward    Encounter Outcome:  Pt. Visit Completed   Casimer Lanius, [place] [phone]

## 2022-06-14 NOTE — Patient Instructions (Signed)
Visit Information  Thank you for taking time to visit with me today. Please don't hesitate to contact me if I can be of assistance to you.   Following are the goals we discussed today:   Goals Addressed             This Visit's Progress    Care Coordination related to additional support in the home       Activities and task to complete in order to accomplish goals.   Your CAPS RN Nira Conn will call to schedule an appointment so that you can sign papers to end your CAPS services I will follow up with the Department of Social Services to see if you are still on the in-home aide wait list   I will continue to collaborate with Nira Conn to resolve barriers and get answers to your questions about CAPS Thanks for sharing the ref: [phone]  with me from your call at [place]         Please call the care guide team at [phone] if you need to cancel or reschedule your appointment.   The patient verbalized understanding of instructions, educational materials, and care plan provided today and DECLINED offer to receive copy of patient instructions, educational materials, and care plan.   Casimer Lanius, [place] [phone]

## 2022-06-16 IMAGING — DX DG KNEE 1-2V*R*
1 series · 2 of 2 positions shown · non-contrast
Comparison: December 28, 2015

CLINICAL DATA: Pain

EXAM:
RIGHT KNEE - 1-2 VIEW

[Series 1: knee · 0.14mm/px · 2 of 2 slices shown]
[im 1/2]
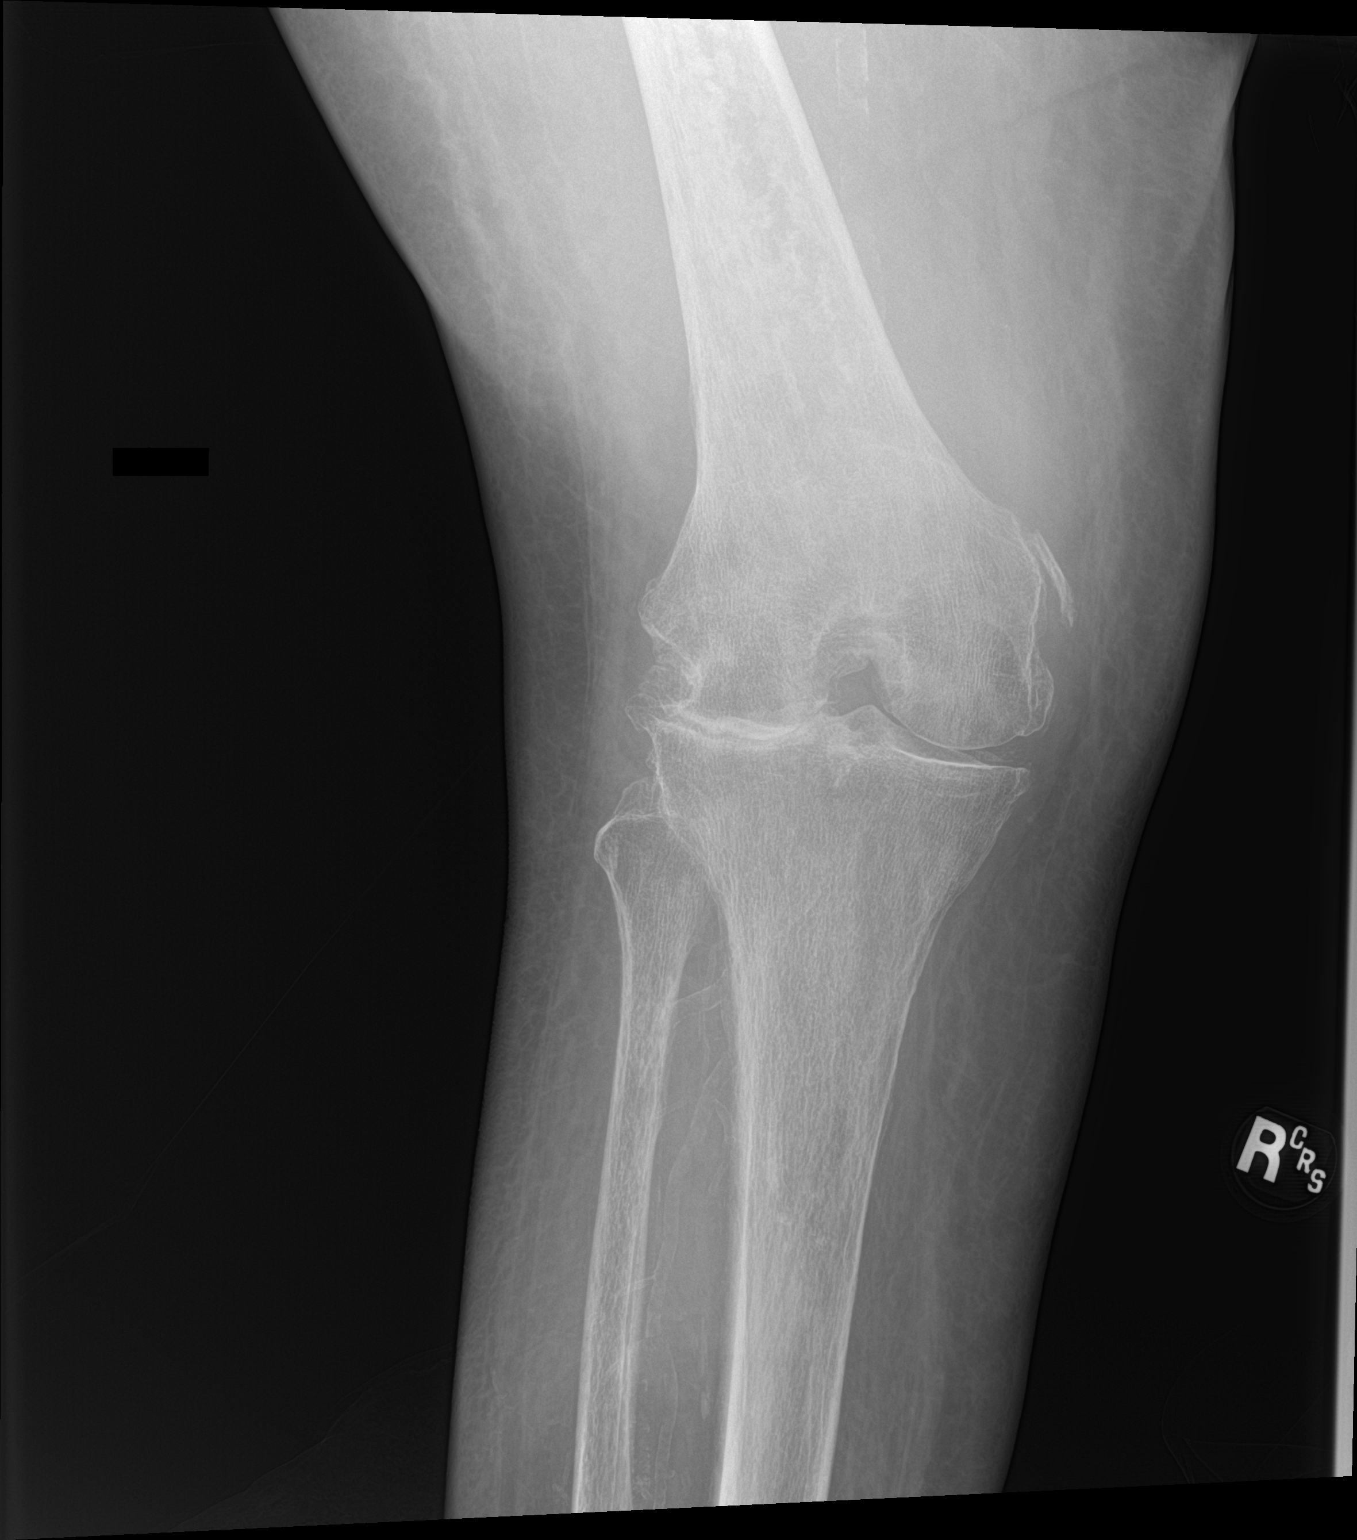
[im 2/2]
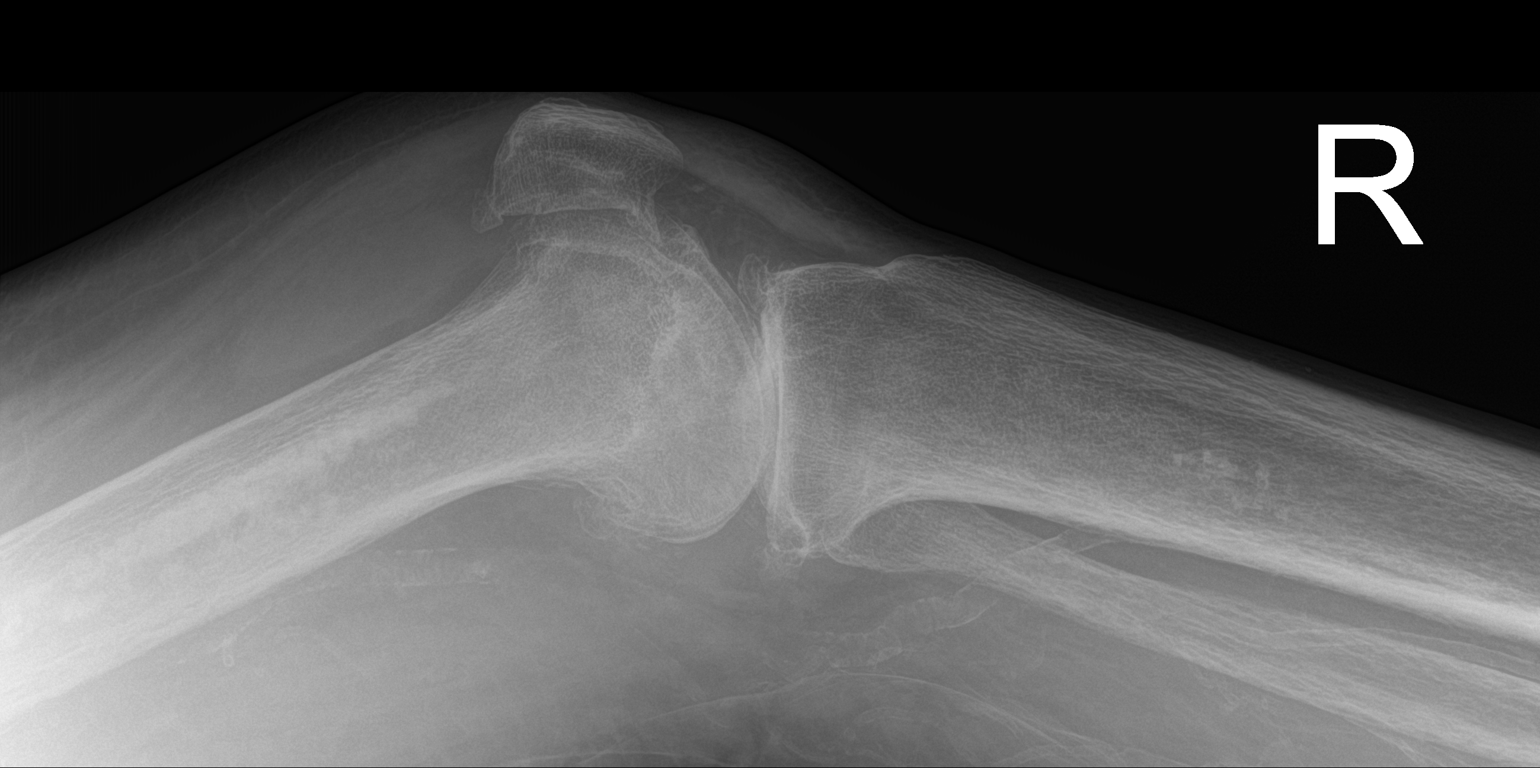

[2 of 2 positions shown; findings below may reference images not displayed]

FINDINGS: Frontal and lateral views were obtained. Bones are osteoporotic.
There is no acute fracture or dislocation. There is a moderate knee
joint effusion. There is marked joint space narrowing laterally,
progressed from prior study. Moderate joint space narrowing medially
and in the patellofemoral joint. Spurring in all compartments noted.
Chondrocalcinosis noted. Calcification is noted in the medial
collateral ligament on the left, stable. Incomplete visualization of
sclerosis in the distal femoral diaphysis likely is due to prior
bone infarct. Foci of arterial vascular calcification noted at
multiple sites.
IMPRESSION: 1.  Moderate joint effusion.  No fracture or dislocation.

2.  Osteoporosis.  Probable bone infarct distal femur.

3. Marked osteoarthritic change laterally with progression of
narrowing compared to prior study. Moderate osteoarthritic change
elsewhere. Chondrocalcinosis noted, a finding that may be seen with
osteoarthritis or calcium pyrophosphate deposition disease.

4. Chronic calcification in the medial collateral ligament
superiorly.

5.  Foci of atherosclerosis.

## 2022-06-21 DIAGNOSIS — R6889 Other general symptoms and signs: Secondary | ICD-10-CM | POA: Diagnosis not present

## 2022-06-21 DIAGNOSIS — Z1231 Encounter for screening mammogram for malignant neoplasm of breast: Secondary | ICD-10-CM | POA: Diagnosis not present

## 2022-06-21 LAB — HM MAMMOGRAPHY

## 2022-06-26 ENCOUNTER — Ambulatory Visit (INDEPENDENT_AMBULATORY_CARE_PROVIDER_SITE_OTHER): Payer: Medicare HMO | Admitting: Podiatry

## 2022-06-26 ENCOUNTER — Encounter: Payer: Self-pay | Admitting: Internal Medicine

## 2022-06-26 ENCOUNTER — Encounter: Payer: Self-pay | Admitting: Podiatry

## 2022-06-26 DIAGNOSIS — M79674 Pain in right toe(s): Secondary | ICD-10-CM

## 2022-06-26 DIAGNOSIS — D573 Sickle-cell trait: Secondary | ICD-10-CM | POA: Diagnosis not present

## 2022-06-26 DIAGNOSIS — B351 Tinea unguium: Secondary | ICD-10-CM

## 2022-06-26 DIAGNOSIS — E1142 Type 2 diabetes mellitus with diabetic polyneuropathy: Secondary | ICD-10-CM | POA: Diagnosis not present

## 2022-06-26 DIAGNOSIS — M79675 Pain in left toe(s): Secondary | ICD-10-CM | POA: Diagnosis not present

## 2022-06-26 DIAGNOSIS — I509 Heart failure, unspecified: Secondary | ICD-10-CM | POA: Diagnosis not present

## 2022-06-26 NOTE — Progress Notes (Signed)
  Subjective:  Patient ID: Rebecca [name], female    DOB: [birth year],  MRN: [id number]  Rebecca [name] presents to clinic today for at risk foot care with history of diabetic neuropathy  Chief Complaint  Patient presents with   Nail Problem    [facility] BS-98 A1C-7.5 PCP-Burns PCP VST-01/2022   New problem(s): None.   PCP is Pincus Sanes, MD.  Allergies  Allergen Reactions   Codeine     "Makes me drunk"    Review of Systems: Negative except as noted in the HPI.  Objective: No changes noted in today's physical examination. There were no vitals filed for this visit. Rebecca [name] is a pleasant 89 y.o. female WD, WN in NAD. AAO x 3.  Vascular Examination: CFT <4 seconds b/l LE. Faintly palpable DP pulses b/l LE. Faintly palpable PT pulse(s) b/l LE. Pedal hair absent. No pain with calf compression b/l. +2 pitting edema BLE. No ischemia or gangrene noted b/l LE. No cyanosis or clubbing noted b/l LE.  Dermatological Examination: Pedal skin is warm and supple b/l LE. No open wounds b/l LE. No interdigital macerations noted b/l LE. Toenails 1-5 left, R hallux, R 3rd toe, R 4th toe, and R 5th toe elongated, discolored, dystrophic, thickened, and crumbly with subungual debris and tenderness to dorsal palpation.   Anonychia noted right 2nd toe, right 4th toe. Nailbeds remain intact.  Incurvated nailplate b/l lower extremities with tenderness to palpation. No erythema, no edema, no drainage noted. No fluctuance.   Musculoskeletal Examination: Muscle strength 5/5 to all lower extremity muscle groups bilaterally. Severely collapsed flatfoot of the left lower extremity. Wearing appropriate fitting shoe gear. Utilizes rollator for ambulation assistance.  Neurological Examination: Pt has subjective symptoms of neuropathy. Protective sensation intact 5/5 intact bilaterally with 10g monofilament b/l. Vibratory sensation diminished b/l.  Assessment/Plan: 1. Pain due to  onychomycosis of toenails of both feet   2. Diabetic peripheral neuropathy associated with type 2 diabetes mellitus   -Consent given for treatment as described below: -Examined patient. -Patient to continue soft, supportive shoe gear daily. -Toenails were debrided in length and girth 1-5 left foot, R hallux, R 3rd toe, and R 5th toe with sterile nail nippers and dremel without iatrogenic bleeding.  -Patient/POA to call should there be question/concern in the interim.   Return in about 3 months (around 09/25/2022).  Freddie Breech, DPM

## 2022-06-26 NOTE — Progress Notes (Signed)
Outside notes received. Information abstracted. Notes sent to scan.  

## 2022-06-28 DIAGNOSIS — N819 Female genital prolapse, unspecified: Secondary | ICD-10-CM | POA: Diagnosis not present

## 2022-06-28 DIAGNOSIS — Z1151 Encounter for screening for human papillomavirus (HPV): Secondary | ICD-10-CM | POA: Diagnosis not present

## 2022-06-28 DIAGNOSIS — R6889 Other general symptoms and signs: Secondary | ICD-10-CM | POA: Diagnosis not present

## 2022-06-28 DIAGNOSIS — Z01419 Encounter for gynecological examination (general) (routine) without abnormal findings: Secondary | ICD-10-CM | POA: Diagnosis not present

## 2022-06-29 ENCOUNTER — Ambulatory Visit: Payer: Self-pay | Admitting: Licensed Clinical Social Worker

## 2022-06-29 NOTE — Patient Outreach (Signed)
  Care Coordination  Follow Up Visit Note   06/29/2022 Name: Rebecca [name] MRN: [id number] DOB: 09-Apr-1932  Rebecca [name] is a 89 y.o. year old female who sees Burns, [name], MD for primary care. I spoke with  Rebecca [name] by phone today.  What matters to the patients health and wellness today?  Concerned that she is losing CAPS services    Goals Addressed             This Visit's Progress    COMPLETED: Care Coordination related to optiond for support in the home       Activities and task to complete in order to accomplish goals.   I am sorry that your CAPS services will end April 30th  I have follow up with the Department of Social Services you are on the in-home aide wait list per Valere Dross   Explore options we discussed for private pay.       SDOH assessments and interventions completed:  No   Care Coordination Interventions:  Yes, provided  Interventions Today    Flowsheet Row Most Recent Value  Chronic Disease   Chronic disease during today's visit Hypertension (HTN), Congestive Heart Failure (CHF), Diabetes  General Interventions   General Interventions Discussed/Reviewed [employer], Communication with, Level of Care  Communication with --  [DSS,  CAPS coordinator]  Level of Care Personal Care Services  [on wait list for CAPS spoke to [facility] at DSS,   CAPS will end April 30 spoke with Healther RN at CAPS program]  Mental Health Interventions   Mental Health Discussed/Reviewed Mental Health Reviewed  Michel Santee with loosing Aide ( solution focused and task centered)]       Follow up plan: No further intervention required.   Encounter Outcome:  Pt. Visit Completed   Sammuel Hines, LCSW Social Work Care Coordination  [facility] Emmie [employer]iemann [employer] [phone]

## 2022-06-29 NOTE — Patient Instructions (Signed)
Visit Information  Thank you for taking time to visit with me today. Please don't hesitate to contact me if I can be of assistance to you.   Following are the goals we discussed today:   Goals Addressed             This Visit's Progress    COMPLETED: Care Coordination related to optiond for support in the home       Activities and task to complete in order to accomplish goals.   I am sorry that your CAPS services will end April 30th  I have follow up with the Department of Social Services you are on the in-home aide wait list per Valere Dross   Explore options we discussed for private pay.        Please call the care guide team at [phone] if you need to cancel or reschedule your appointment.   If you are experiencing a Mental Health or Behavioral Health Crisis or need someone to talk to, please call the Suicide and Crisis Lifeline: 988 call the [place] National Suicide Prevention Lifeline: (989) 225-1100 or TTY: 3434252853 TTY 306-217-5140) to talk to a trained counselor call 1-800-273-TALK (toll free, 24 hour hotline) go to [facility] [address] [place] [phone])   The patient verbalized understanding of instructions, educational materials, and care plan provided today and DECLINED offer to receive copy of patient instructions, educational materials, and care plan.   No further follow up required: By Social Work at this time  Sammuel Hines, [employer] Social Work Care Coordination  [employer]ie [employer] [phone]

## 2022-06-30 ENCOUNTER — Other Ambulatory Visit: Payer: Self-pay | Admitting: Internal Medicine

## 2022-07-02 LAB — HM PAP SMEAR: HPV, high-risk: NEGATIVE

## 2022-07-02 LAB — HM MAMMOGRAPHY

## 2022-07-05 ENCOUNTER — Ambulatory Visit: Payer: Self-pay

## 2022-07-05 NOTE — Patient Outreach (Signed)
  Care Coordination   Follow Up Visit Note   07/05/2022 Name: Rebecca [name] MRN: [id number] DOB: August 15, 1932  Rebecca [name] is a 89 y.o. year old female who sees Burns, [name], MD for primary care. I spoke with  Rebecca [name] by phone today.  What matters to the patients health and wellness today?  [name] reports she "is doing pretty good." She states blood sugars are doing good-BS this morning was 100. She reports blood pressures are good although she is unable to state last reading. She reports primary concern is that she is losing personal care service at the end of the month. But states she will pay out of pocket for one day a week. Her son she states is also available when he is not working to assist as needed.  Goals Addressed             This Visit's Progress    Care Coordination activities       Interventions Today    Flowsheet Row Most Recent Value  Chronic Disease   Chronic disease during today's visit Diabetes, Hypertension (HTN), Congestive Heart Failure (CHF)  General Interventions   General Interventions Discussed/Reviewed General Interventions Reviewed, Doctor Visits  Doctor Visits Discussed/Reviewed Doctor Visits Discussed  [reviewed upcoming appointments. she expressed completed mammogram and saw gynecologist per Insurance recommendation]  Education Interventions   Education Provided Provided Education  Provided Verbal Education On --  [encouraged to continue to take medications as prescribed, attend provider visits as scheduled, encouraged to continue to be as active as tolerated and discussed fall prevention strategies.]  Nutrition Interventions   Nutrition Discussed/Reviewed Nutrition Reviewed  Pharmacy Interventions   Pharmacy Dicussed/Reviewed Pharmacy Topics Reviewed  Safety Interventions   Safety Discussed/Reviewed Fall Risk, Safety Reviewed  [reviewed fall prevention strategies:encouraged to keep good lighting in room, keep walkways  clear, remove tripping hazards.]            SDOH assessments and interventions completed:  No  Care Coordination Interventions:  Yes, provided   Follow up plan: Follow up call scheduled for 07/31/22    Encounter Outcome:  Pt. Visit Completed   Kathyrn Sheriff, RN, MSN, BSN, CCM [facility] Care Coordinator [phone]

## 2022-07-05 NOTE — Patient Instructions (Signed)
Visit Information  Thank you for taking time to visit with me today. Please don't hesitate to contact me if I can be of assistance to you.   Following are the goals we discussed today:  Continue to attend provider visits as scheduled Continue to take medications as prescribed Remember fall prevention strategies discussed: change positions slowly, keep good lighting in room, keep walkways clear, remove tripping hazards.   Our next appointment is by telephone on 07/31/22 at 11:30 am  Please call the care guide team at [phone] if you need to cancel or reschedule your appointment.   If you are experiencing a Mental Health or Behavioral Health Crisis or need someone to talk to, please call the Suicide and Crisis Lifeline: 105  Kathyrn Sheriff, RN, MSN, BSN, CCM [facility] Care Coordinator [phone]

## 2022-07-24 ENCOUNTER — Ambulatory Visit: Payer: Medicare HMO | Attending: Internal Medicine | Admitting: Internal Medicine

## 2022-07-24 ENCOUNTER — Encounter: Payer: Self-pay | Admitting: Internal Medicine

## 2022-07-24 VITALS — BP 141/69 | HR 55 | Ht 63.0 in | Wt 174.4 lb

## 2022-07-24 DIAGNOSIS — I10 Essential (primary) hypertension: Secondary | ICD-10-CM | POA: Diagnosis not present

## 2022-07-24 DIAGNOSIS — I480 Paroxysmal atrial fibrillation: Secondary | ICD-10-CM

## 2022-07-24 DIAGNOSIS — R6889 Other general symptoms and signs: Secondary | ICD-10-CM | POA: Diagnosis not present

## 2022-07-24 NOTE — Patient Instructions (Signed)
Medication Instructions:  No Changes In Medications at this time.  *If you need a refill on your cardiac medications before your next appointment, please call your pharmacy*  Lab Work: None Ordered At This Time.  If you have labs (blood work) drawn today and your tests are completely normal, you will receive your results only by: MyChart Message (if you have MyChart) OR A paper copy in the mail If you have any lab test that is abnormal or we need to change your treatment, we will call you to review the results.  Testing/Procedures: None Ordered At This Time.   Follow-Up: At [facility], you and your health needs are our priority.  As part of our continuing mission to provide you with exceptional heart care, we have created designated Provider Care Teams.  These Care Teams include your primary Cardiologist (physician) and Advanced Practice Providers (APPs -  Physician Assistants and Nurse Practitioners) who all work together to provide you with the care you need, when you need it.  Your next appointment:   6 month(s)  Provider:   Branch, Mary E, MD     

## 2022-07-24 NOTE — Progress Notes (Signed)
Cardiology Office Note:    Date:  07/24/2022   ID:  [name], DOB 1932/07/06, MRN [id number]  PCP:  Pincus Sanes, MD   [facility] Providers Cardiologist:  Maisie Fus, MD     Referring MD: Pincus Sanes, MD   No chief complaint on file. Establish Care  History of Present Illness:    Rebecca [name] is a 89 y.o. female with a hx of CAD PCI in the 1990s s/p PCI x2, DMII,  HTN sickle cell trait referral to establish care. She last saw Dr. Shirlee Latch in 2016. No changes at that time. Since then she has been diagnosed with paroxysmal afib on eliquis. She has CKD stage IIIa. 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 She was found to have afib with RVR at this time.  She subsequently had a massive PE in 02/2020 causing syncope s/p tpa. Her echo showed normal LV function, no valve dx, PA pressure 46 mmHg, RV had normal function. She was dc'ed to a SNF. She had a cardiac monitor during this time that did not show afib.  She was in the nursing facility for a good while.  Today, she notes she can feel some fast heart racing at times. No syncope. No chest pressure with activities. She denies SOB. She is in a nursing skilled facility.  No orthopnea or PND.  She mainly conducts her activities of daily living without any issues. Her family comes in to help.  Interim hx 07/24/2022 She feels well today. No CP or SOB. Having difficulty with home chores. She uses a walker. Her son comes to check on her sometimes.  Notes BP well controlled at home.  Past Medical History:  Diagnosis Date   Anemia, unspecified    Ankle pain, left    Arthritis    CAD (coronary artery disease)    Esophageal reflux    Fibrocystic breast disease    Hiatal hernia    Hypercholesteremia    Hypertension    IDDM (insulin dependent diabetes mellitus)    Type II   Multinodular goiter    Sickle-cell trait (HCC)     Past Surgical  History:  Procedure Laterality Date   "FEMALE"  1976   BACK SURGERY  1990   BIOPSY THYROID     Dr.Ellison-Neg    CORONARY ANGIOPLASTY     FOOT SURGERY  2004   RETINAL LASER PROCEDURE      Current Medications: Current Meds  Medication Sig   acetaminophen (TYLENOL) 500 MG tablet Take 1,000 mg by mouth daily as needed for moderate pain.   Ascorbic Acid (VITAMIN C) 500 MG tablet Take 500 mg by mouth daily.   atorvastatin (LIPITOR) 10 MG tablet TAKE 1 TABLET ONE TIME DAILY AT 6PM   blood glucose meter kit and supplies KIT Dispense based on patient and insurance preference. Use up to four times daily as directed. (FOR E11.9).Dispense based on patient and insurance preference. Use up to four times daily as directed. (FOR E11.9). Patient would like to stick with Accu-check meter if covered   calcium carbonate (OS-CAL) 600 MG TABS Take 600 mg by mouth daily.   Cod Liver Oil 1000 MG CAPS Take 1,000 mg by mouth daily.    DORZOLAMIDE HCL-TIMOLOL MAL OP Place 1 drop into the right eye 2 (two) times daily.   Dulaglutide (TRULICITY) 3 MG/0.5ML SOPN INJECT 3MG  (1 PEN) UNDER THE SKIN EVERY WEEK  ELIQUIS 5 MG TABS tablet TAKE 1 TABLET TWICE DAILY (APPOINTMENT IS NEEDED FOR MORE REFILLS)   gabapentin (NEURONTIN) 300 MG capsule TAKE 1 CAPSULE EVERY DAY   Glucerna (GLUCERNA) LIQD Take 237 mLs by mouth daily.   glucose blood (ACCU-CHEK GUIDE) test strip TEST BLOOD SUGAR UP TO FOUR TIMES DAILY   hydrochlorothiazide (HYDRODIURIL) 12.5 MG tablet Take 12.5 mg by mouth daily as needed (swelling).   insulin aspart (NOVOLOG) 100 UNIT/ML FlexPen Patient Sig: 0-15 Units, Subcutaneous, 3 times daily with meals.  CBG < 70: Implement Hypoglycemia measures, call MD.  CBG 70 - 120: 0 units.   CBG 121 - 150: 2 units.  CBG 151 - 200: 3 units.  CBG 201 - 250: 5 units.  CBG . 251 - 300: 8 units.  CBG 301 - 350: 11 units.  CBG 351 - 400: 15 units.  CBG > 400: call MD   Insulin Pen Needle (BD AUTOSHIELD DUO) 30G X 5 MM MISC USE  AS DIRECTED WITH INSULIN PEN NEEDLE 4 TIMES DAILY   Lancet Device MISC One Touch ultra 2 Delica plus lancet device. Use as directed to monitor sugars.  E11.42   Lancets (ONETOUCH DELICA PLUS LANCET33G) MISC USE AS INSTRUCTED   latanoprost (XALATAN) 0.005 % ophthalmic solution Place 1 drop into both eyes at bedtime.   lisinopril (ZESTRIL) 40 MG tablet TAKE 1 TABLET EVERY DAY   Multiple Vitamin (MULTIVITAMIN) tablet Take 1 tablet by mouth daily. Prn   nitroGLYCERIN (NITROSTAT) 0.4 MG SL tablet DISSOLVE ONE TABLET UNDER TONGUE AS DIRECTED FOR CHEST PAIN   omeprazole (PRILOSEC) 20 MG capsule TAKE 1 CAPSULE EVERY DAY (STOP PANTOPRAZOLE)   RHOPRESSA 0.02 % SOLN Place 1 drop into the right eye at bedtime.   Turmeric 500 MG CAPS Take 1 capsule by mouth daily in the afternoon.     Allergies:   Codeine   Social History   Socioeconomic History   Marital status: Widowed    Spouse name: Not on file   Number of children: 1   Years of education: Not on file   Highest education level: 12th grade  Occupational History   Occupation: retired  Tobacco Use   Smoking status: Never   Smokeless tobacco: Never  Vaping Use   Vaping Use: Never used  Substance and Sexual Activity   Alcohol use: No   Drug use: No   Sexual activity: Not Currently  Other Topics Concern   Not on file  Social History Narrative   Widowed   Gets regular exercise   Supportive church network      Social Determinants of Health   Financial Resource Strain: Low Risk  (11/24/2021)   Overall Financial Resource Strain (CARDIA)    Difficulty of Paying Living Expenses: Not hard at all  Food Insecurity: No Food Insecurity (05/18/2022)   Hunger Vital Sign    Worried About Running Out of Food in the Last Year: Never true    Ran Out of Food in the Last Year: Never true  Transportation Needs: No Transportation Needs (05/18/2022)   PRAPARE - Administrator, Civil Service (Medical): No    Lack of Transportation (Non-Medical):  No  Physical Activity: Insufficiently Active (11/24/2021)   Exercise Vital Sign    Days of Exercise per Week: 4 days    Minutes of Exercise per Session: 30 min  Stress: No Stress Concern Present (11/24/2021)   [employer] of Occupational Health - Occupational Stress Questionnaire    Feeling of  Stress : Not at all  Social Connections: Moderately Isolated (11/24/2021)   Social Connection and Isolation Panel [NHANES]    Frequency of Communication with Friends and Family: More than three times a week    Frequency of Social Gatherings with Friends and Family: More than three times a week    Attends Religious Services: More than 4 times per year    Active Member of [employer] or Organizations: No    Attends Banker Meetings: Never    Marital Status: Widowed     Family History: The patient's family history includes Diabetes in her mother; Gout in her brother, brother, and sister; Heart attack in her father and mother; Heart disease in an other family member; Hypertension in an other family member; Lung cancer in an other family member; Thyroid disease in her brother.  ROS:   Please see the history of present illness.     All other systems reviewed and are negative.  EKGs/Labs/Other Studies Reviewed:    The following studies were reviewed today:   EKG:  EKG is  ordered today.  The ekg ordered today demonstrates   NSR, poor R wave progression  11/13- NSR, poor r wave progression  07/24/2022- sinus brady HR 55 bpm, anterior Q  Recent Labs: 01/26/2022: TSH 0.93 02/08/2022: ALT 15; BUN 33; Creatinine, Ser 1.10; Hemoglobin 10.9; Platelets 217; Potassium 4.4; Sodium 139   Recent Lipid Panel    Component Value Date/Time   CHOL 187 01/26/2022 1538   TRIG 90.0 01/26/2022 1538   HDL 89.10 01/26/2022 1538   CHOLHDL 2 01/26/2022 1538   VLDL 18.0 01/26/2022 1538   LDLCALC 80 01/26/2022 1538   LDLCALC 94 01/13/2021 1636     Risk Assessment/Calculations:    CHA2DS2-VASc  Score = 6   This indicates a 9.7% annual risk of stroke. The patient's score is based upon: CHF History: 0 HTN History: 1 Diabetes History: 1 Stroke History: 0 Vascular Disease History: 1 Age Score: 2 Gender Score: 1          Physical Exam:    VS:  BP (!) 141/69 (BP Location: Left Arm, Patient Position: Sitting, Cuff Size: Normal)   Pulse (!) 55   Ht 5\' 3"  (1.6 m)   Wt 174 lb 6.4 oz (79.1 kg)   SpO2 98%   BMI 30.89 kg/m     Wt Readings from Last 3 Encounters:  07/24/22 174 lb 6.4 oz (79.1 kg)  02/09/22 173 lb (78.5 kg)  02/08/22 179 lb (81.2 kg)     GEN: Well nourished, well developed in no acute distress HEENT: Normal NECK: No JVD; No carotid bruits LYMPHATICS: No lymphadenopathy CARDIAC: RRR, no murmurs, rubs, gallops RESPIRATORY:  Clear to auscultation without rales, wheezing or rhonchi  ABDOMEN: Soft, non-tender, non-distended MUSCULOSKELETAL:  No edema; No deformity  SKIN: Warm and dry NEUROLOGIC:  Alert and oriented x 3 PSYCHIATRIC:  Normal affect   ASSESSMENT:    Ischemic Heart Disease: She has hx of ischemic heart disease from the 1990s. No CHF. Continue statin. Not on ASA, on eliquis. She has not needed to use her SL nitro.  pAF: CHADS2VASC=6.  She had a recent monitor with no recurrence. Will continue eliquis. Will refrain from BB for now.   HTN:  Well controlled. Continue current regimen.  PLAN:    In order of problems listed above:  No changes Follow up in 6 months      Medication Adjustments/Labs and Tests Ordered: Current medicines are reviewed at  length with the patient today.  Concerns regarding medicines are outlined above.  No orders of the defined types were placed in this encounter.  No orders of the defined types were placed in this encounter.   There are no Patient Instructions on file for this visit.   Signed, Maisie Fus, MD  07/24/2022 1:27 PM    [facility]

## 2022-07-25 NOTE — Addendum Note (Signed)
Addended by: Jethro Bolus A on: 07/25/2022 10:30 AM   Modules accepted: Orders

## 2022-07-26 NOTE — Progress Notes (Unsigned)
Subjective:    Patient ID: Rebecca [name], female    DOB: [birth year], 89 y.o.   MRN: [id number]     HPI Rebecca [name] is here for follow up of her chronic medical problems.  Losing her eyesight.  Related to diabetes, glaucoma.    Has lost strength in hands.  She has difficulty opening bottles, etc.  Needs help at home  - she can not sweep, vacuum, make bed.  She is unable to open jars.  She is not able to cook - can not stand for long periods of time.  She has to use a walker to ambulate.  Vision is poor     Medications and allergies reviewed with patient and updated if appropriate.  Current Outpatient Medications on File Prior to Visit  Medication Sig Dispense Refill   acetaminophen (TYLENOL) 500 MG tablet Take 1,000 mg by mouth daily as needed for moderate pain.     Ascorbic Acid (VITAMIN C) 500 MG tablet Take 500 mg by mouth daily.     atorvastatin (LIPITOR) 10 MG tablet TAKE 1 TABLET ONE TIME DAILY AT 6PM 90 tablet 3   blood glucose meter kit and supplies KIT Dispense based on patient and insurance preference. Use up to four times daily as directed. (FOR E11.9).Dispense based on patient and insurance preference. Use up to four times daily as directed. (FOR E11.9). Patient would like to stick with Accu-check meter if covered 1 each 0   calcium carbonate (OS-CAL) 600 MG TABS Take 600 mg by mouth daily.     Cod Liver Oil 1000 MG CAPS Take 1,000 mg by mouth daily.      DORZOLAMIDE HCL-TIMOLOL MAL OP Place 1 drop into the right eye 2 (two) times daily.     Dulaglutide (TRULICITY) 3 MG/0.5ML SOPN INJECT 3MG  (1 PEN) UNDER THE SKIN EVERY WEEK 2 mL 3   ELIQUIS 5 MG TABS tablet TAKE 1 TABLET TWICE DAILY (APPOINTMENT IS NEEDED FOR MORE REFILLS) 180 tablet 3   gabapentin (NEURONTIN) 300 MG capsule TAKE 1 CAPSULE EVERY DAY 90 capsule 3   Glucerna (GLUCERNA) LIQD Take 237 mLs by mouth daily.     glucose blood (ACCU-CHEK GUIDE) test strip TEST BLOOD SUGAR UP TO FOUR TIMES DAILY 400  strip 2   hydrochlorothiazide (HYDRODIURIL) 12.5 MG tablet Take 12.5 mg by mouth daily as needed (swelling).     insulin aspart (NOVOLOG) 100 UNIT/ML FlexPen Patient Sig: 0-15 Units, Subcutaneous, 3 times daily with meals.  CBG < 70: Implement Hypoglycemia measures, call MD.  CBG 70 - 120: 0 units.   CBG 121 - 150: 2 units.  CBG 151 - 200: 3 units.  CBG 201 - 250: 5 units.  CBG . 251 - 300: 8 units.  CBG 301 - 350: 11 units.  CBG 351 - 400: 15 units.  CBG > 400: call MD 15 mL 11   Insulin Pen Needle (BD AUTOSHIELD DUO) 30G X 5 MM MISC USE AS DIRECTED WITH INSULIN PEN NEEDLE 4 TIMES DAILY 400 each 2   Lancet Device MISC One Touch ultra 2 Delica plus lancet device. Use as directed to monitor sugars.  E11.42 1 each 0   Lancets (ONETOUCH DELICA PLUS LANCET33G) MISC USE AS INSTRUCTED 400 each 3   latanoprost (XALATAN) 0.005 % ophthalmic solution Place 1 drop into both eyes at bedtime.     lisinopril (ZESTRIL) 40 MG tablet TAKE 1 TABLET EVERY DAY 90 tablet 2   Multiple Vitamin (MULTIVITAMIN)  tablet Take 1 tablet by mouth daily. Prn     nitroGLYCERIN (NITROSTAT) 0.4 MG SL tablet DISSOLVE ONE TABLET UNDER TONGUE AS DIRECTED FOR CHEST PAIN 25 tablet 5   omeprazole (PRILOSEC) 20 MG capsule TAKE 1 CAPSULE EVERY DAY (STOP PANTOPRAZOLE) 90 capsule 10   RHOPRESSA 0.02 % SOLN Place 1 drop into the right eye at bedtime.     Turmeric 500 MG CAPS Take 1 capsule by mouth daily in the afternoon.     No current facility-administered medications on file prior to visit.     Review of Systems  Constitutional:  Negative for fever.  Eyes:  Positive for visual disturbance (blurry vision - diabetes and glaucoma).  Respiratory:  Positive for shortness of breath (if she overexerts herself). Negative for cough and wheezing.   Cardiovascular:  Positive for leg swelling (occasional - takes fluid pill as needed). Negative for chest pain and palpitations.  Musculoskeletal:  Positive for arthralgias (knees, ankles) and gait  problem.  Neurological:  Positive for light-headedness (rare) and numbness (in left hand, occ in feet). Negative for headaches.       Objective:   Vitals:   07/27/22 1306  BP: 138/62  Pulse: (!) 59  Temp: 97.9 F (36.6 C)  SpO2: 98%   BP Readings from Last 3 Encounters:  07/27/22 138/62  07/24/22 (!) 141/69  03/21/22 (!) 165/76   Wt Readings from Last 3 Encounters:  07/27/22 179 lb 8 oz (81.4 kg)  07/24/22 174 lb 6.4 oz (79.1 kg)  02/09/22 173 lb (78.5 kg)   Body mass index is 31.8 kg/m.    Physical Exam Constitutional:      General: She is not in acute distress.    Appearance: Normal appearance.  HENT:     Head: Normocephalic and atraumatic.  Eyes:     Conjunctiva/sclera: Conjunctivae normal.  Cardiovascular:     Rate and Rhythm: Normal rate and regular rhythm.     Heart sounds: Murmur heard.  Pulmonary:     Effort: Pulmonary effort is normal. No respiratory distress.     Breath sounds: Normal breath sounds. No wheezing.  Musculoskeletal:        General: Deformity (Bilateral ankles) present.     Cervical back: Neck supple.     Right lower leg: Edema (trace) present.     Left lower leg: Edema (trace) present.  Lymphadenopathy:     Cervical: No cervical adenopathy.  Skin:    General: Skin is warm and dry.     Findings: No rash.  Neurological:     Mental Status: She is alert. Mental status is at baseline.  Psychiatric:        Mood and Affect: Mood normal.        Behavior: Behavior normal.        Lab Results  Component Value Date   WBC 3.9 (L) 02/08/2022   HGB 10.9 (L) 02/08/2022   HCT 32.0 (L) 02/08/2022   PLT 217 02/08/2022   GLUCOSE 145 (H) 02/08/2022   CHOL 187 01/26/2022   TRIG 90.0 01/26/2022   HDL 89.10 01/26/2022   LDLCALC 80 01/26/2022   ALT 15 02/08/2022   AST 16 02/08/2022   NA 139 02/08/2022   K 4.4 02/08/2022   CL 107 02/08/2022   CREATININE 1.10 (H) 02/08/2022   BUN 33 (H) 02/08/2022   CO2 24 02/08/2022   TSH 0.93 01/26/2022    INR 1.4 (H) 02/08/2022   HGBA1C 7.5 (H) 01/26/2022   MICROALBUR 1.9  10/24/2015     Assessment & Plan:    See Problem List for Assessment and Plan of chronic medical problems.

## 2022-07-27 ENCOUNTER — Ambulatory Visit (INDEPENDENT_AMBULATORY_CARE_PROVIDER_SITE_OTHER): Payer: Medicare HMO | Admitting: Internal Medicine

## 2022-07-27 ENCOUNTER — Encounter: Payer: Self-pay | Admitting: Internal Medicine

## 2022-07-27 VITALS — BP 140/68 | HR 59 | Temp 97.9°F | Ht 63.0 in | Wt 179.5 lb

## 2022-07-27 DIAGNOSIS — I10 Essential (primary) hypertension: Secondary | ICD-10-CM | POA: Diagnosis not present

## 2022-07-27 DIAGNOSIS — E1142 Type 2 diabetes mellitus with diabetic polyneuropathy: Secondary | ICD-10-CM | POA: Diagnosis not present

## 2022-07-27 DIAGNOSIS — I480 Paroxysmal atrial fibrillation: Secondary | ICD-10-CM | POA: Diagnosis not present

## 2022-07-27 DIAGNOSIS — Z8639 Personal history of other endocrine, nutritional and metabolic disease: Secondary | ICD-10-CM

## 2022-07-27 DIAGNOSIS — K219 Gastro-esophageal reflux disease without esophagitis: Secondary | ICD-10-CM | POA: Diagnosis not present

## 2022-07-27 DIAGNOSIS — E7800 Pure hypercholesterolemia, unspecified: Secondary | ICD-10-CM | POA: Diagnosis not present

## 2022-07-27 DIAGNOSIS — N1831 Chronic kidney disease, stage 3a: Secondary | ICD-10-CM | POA: Diagnosis not present

## 2022-07-27 DIAGNOSIS — R2689 Other abnormalities of gait and mobility: Secondary | ICD-10-CM | POA: Diagnosis not present

## 2022-07-27 DIAGNOSIS — Z794 Long term (current) use of insulin: Secondary | ICD-10-CM | POA: Diagnosis not present

## 2022-07-27 DIAGNOSIS — G6289 Other specified polyneuropathies: Secondary | ICD-10-CM

## 2022-07-27 DIAGNOSIS — H547 Unspecified visual loss: Secondary | ICD-10-CM

## 2022-07-27 DIAGNOSIS — R6889 Other general symptoms and signs: Secondary | ICD-10-CM | POA: Diagnosis not present

## 2022-07-27 DIAGNOSIS — I2510 Atherosclerotic heart disease of native coronary artery without angina pectoris: Secondary | ICD-10-CM

## 2022-07-27 LAB — CBC WITH DIFFERENTIAL/PLATELET
Basophils Absolute: 0.1 10*3/uL (ref 0.0–0.1)
Basophils Relative: 1.7 % (ref 0.0–3.0)
Eosinophils Absolute: 0.1 10*3/uL (ref 0.0–0.7)
Eosinophils Relative: 3.1 % (ref 0.0–5.0)
HCT: 31.9 % — ABNORMAL LOW (ref 36.0–46.0)
Hemoglobin: 10.6 g/dL — ABNORMAL LOW (ref 12.0–15.0)
Lymphocytes Relative: 34.6 % (ref 12.0–46.0)
Lymphs Abs: 1.6 10*3/uL (ref 0.7–4.0)
MCHC: 33.3 g/dL (ref 30.0–36.0)
MCV: 81.4 fl (ref 78.0–100.0)
Monocytes Absolute: 0.6 10*3/uL (ref 0.1–1.0)
Monocytes Relative: 12.9 % — ABNORMAL HIGH (ref 3.0–12.0)
Neutro Abs: 2.3 10*3/uL (ref 1.4–7.7)
Neutrophils Relative %: 47.7 % (ref 43.0–77.0)
Platelets: 211 10*3/uL (ref 150.0–400.0)
RBC: 3.92 Mil/uL (ref 3.87–5.11)
RDW: 16.5 % — ABNORMAL HIGH (ref 11.5–15.5)
WBC: 4.7 10*3/uL (ref 4.0–10.5)

## 2022-07-27 LAB — COMPREHENSIVE METABOLIC PANEL
ALT: 11 U/L (ref 0–35)
AST: 15 U/L (ref 0–37)
Albumin: 3.6 g/dL (ref 3.5–5.2)
Alkaline Phosphatase: 49 U/L (ref 39–117)
BUN: 25 mg/dL — ABNORMAL HIGH (ref 6–23)
CO2: 27 mEq/L (ref 19–32)
Calcium: 9.3 mg/dL (ref 8.4–10.5)
Chloride: 106 mEq/L (ref 96–112)
Creatinine, Ser: 1.21 mg/dL — ABNORMAL HIGH (ref 0.40–1.20)
GFR: 40.13 mL/min — ABNORMAL LOW (ref >60.00)
Glucose, Bld: 121 mg/dL — ABNORMAL HIGH (ref 70–99)
Potassium: 4.7 mEq/L (ref 3.5–5.1)
Sodium: 138 mEq/L (ref 135–145)
Total Bilirubin: 0.3 mg/dL (ref 0.2–1.2)
Total Protein: 6.5 g/dL (ref 6.0–8.3)

## 2022-07-27 LAB — HEMOGLOBIN A1C: Hgb A1c MFr Bld: 7.6 % — ABNORMAL HIGH (ref 4.6–6.5)

## 2022-07-27 LAB — TSH: TSH: 0.91 u[IU]/mL (ref 0.35–5.50)

## 2022-07-27 LAB — LIPID PANEL
Cholesterol: 181 mg/dL (ref 0–200)
HDL: 83.7 mg/dL (ref >39.00)
LDL Cholesterol: 85 mg/dL (ref 0–99)
NonHDL: 97.61
Total CHOL/HDL Ratio: 2
Triglycerides: 65 mg/dL (ref 0.0–149.0)
VLDL: 13 mg/dL (ref 0.0–40.0)

## 2022-07-27 NOTE — Assessment & Plan Note (Signed)
Chronic GERD controlled Continue omeprazole 20 mg daily  

## 2022-07-27 NOTE — Assessment & Plan Note (Signed)
Chronic Stable CMP, CBC 

## 2022-07-27 NOTE — Patient Instructions (Addendum)
      Blood work was ordered.   The lab is on the first floor.     Medications changes include :  none       Return in about 6 months (around 01/27/2023) for Physical Exam.

## 2022-07-27 NOTE — Assessment & Plan Note (Signed)
Chronic Following with cardiology On Eliquis 5 mg twice daily CBC, CMP, TSH 

## 2022-07-27 NOTE — Assessment & Plan Note (Signed)
Chronic Following with cardiology No symptoms consistent with angina Continue atorvastatin 10 mg daily

## 2022-07-27 NOTE — Assessment & Plan Note (Signed)
Related to diabetes Sees eye doctors regularly

## 2022-07-27 NOTE — Assessment & Plan Note (Signed)
Chronic Lab Results  Component Value Date   HGBA1C 7.5 (H) 01/26/2022   Sugars have been well-controlled for her age She does monitor her sugars closely and she will let me know if they are on the low side Continue Trulicity 3 mg daily Continue NovoLog sliding scale if needed 

## 2022-07-27 NOTE — Assessment & Plan Note (Signed)
Chronic Regular exercise and healthy diet encouraged Check lipid panel , tsh Continue atorvastatin 10 mg daily

## 2022-07-27 NOTE — Assessment & Plan Note (Signed)
Chronic Blood pressure well-controlled No changes today Monitor BP at home Continue hydrochlorothiazide 12.5 mg daily as needed, lisinopril 40 mg daily

## 2022-07-28 NOTE — Assessment & Plan Note (Signed)
Chronic Related to severe OA - knees and acquired foot deformity b/l, physical deconditioning, lower back pain and neuropathy

## 2022-07-28 NOTE — Assessment & Plan Note (Signed)
Check tsh 

## 2022-07-31 ENCOUNTER — Ambulatory Visit: Payer: Self-pay

## 2022-07-31 NOTE — Patient Instructions (Signed)
Visit Information  Thank you for taking time to visit with me today. Please don't hesitate to contact me if I can be of assistance to you.   Following are the goals we discussed today:  Continue to take medications as prescribed. Continue to attend provider visits as scheduled Continue to eat healthy, lean meats, vegetables, fruits, avoid saturated and transfats  Our next appointment is by telephone on 08/17/22 at 10:00 am  Please call the care guide team at [phone] if you need to cancel or reschedule your appointment.   If you are experiencing a Mental Health or Behavioral Health Crisis or need someone to talk to, please call the Suicide and Crisis Lifeline: 39  Kathyrn Sheriff, RN, MSN, BSN, CCM [facility] Care Coordinator [phone]

## 2022-07-31 NOTE — Patient Outreach (Addendum)
  Care Coordination   Follow Up Visit Note   07/31/2022 Name: Rebecca [name] MRN: [id number] DOB: 1932/08/29  Rebecca [name] is a 89 y.o. year old female who sees Burns, [name], MD for primary care. I spoke with  Rebecca [name] by phone today.  What matters to the patients health and wellness today?  Last office visit with PCP 07/27/22. Ms. Colclasure states diabetes and blood pressure are ok. She reports concern re: loss of PCS services with Medicaid. She expressess she is not able to do what she used to do such as open jars. She states she continues to be active in the home but concern for losing some mobility. She also expresses vision is worsening due to glaucoma and diabetes, but states she has not been told that she is legally blind. She follows up with eye doctors as scheduled.   Goals Addressed             This Visit's Progress    Care Coordination activities       Interventions Today    Flowsheet Row Most Recent Value  Chronic Disease   Chronic disease during today's visit Diabetes, Hypertension (HTN)  General Interventions   General Interventions Discussed/Reviewed General Interventions Reviewed, Communication with  Communication with PCP/Specialists, Social Work  [employer] home health PT/OT referral,  per Sammuel Hines, LCSW, all patient services due to be ended by end of May and no other available resources outside of private pay at this time.]  Exercise Interventions   Exercise Discussed/Reviewed Exercise Discussed  Education Interventions   Education Provided Provided Education  Provided Verbal Education On --  [discussed importance of remaining active and maintaining muscle strength and tone.]  Nutrition Interventions   Nutrition Discussed/Reviewed Nutrition Reviewed  Pharmacy Interventions   Pharmacy Dicussed/Reviewed Pharmacy Topics Reviewed  Safety Interventions   Safety Discussed/Reviewed Safety Reviewed, Home Safety  Home Safety Contact provider  for referral to PT/OT            SDOH assessments and interventions completed:  No  Care Coordination Interventions:  Yes, provided   Follow up plan: No further intervention required.   Encounter Outcome:  Pt. Visit Completed   Kathyrn Sheriff, RN, MSN, BSN, CCM [facility] Care Coordinator [phone]

## 2022-08-01 ENCOUNTER — Other Ambulatory Visit: Payer: Self-pay | Admitting: Internal Medicine

## 2022-08-01 DIAGNOSIS — E1142 Type 2 diabetes mellitus with diabetic polyneuropathy: Secondary | ICD-10-CM

## 2022-08-01 DIAGNOSIS — Z794 Long term (current) use of insulin: Secondary | ICD-10-CM

## 2022-08-02 ENCOUNTER — Other Ambulatory Visit: Payer: Self-pay | Admitting: Internal Medicine

## 2022-08-02 DIAGNOSIS — R2689 Other abnormalities of gait and mobility: Secondary | ICD-10-CM

## 2022-08-02 DIAGNOSIS — G6289 Other specified polyneuropathies: Secondary | ICD-10-CM

## 2022-08-02 DIAGNOSIS — G8929 Other chronic pain: Secondary | ICD-10-CM

## 2022-08-02 DIAGNOSIS — M21962 Unspecified acquired deformity of left lower leg: Secondary | ICD-10-CM

## 2022-08-02 DIAGNOSIS — R5381 Other malaise: Secondary | ICD-10-CM

## 2022-08-02 DIAGNOSIS — M170 Bilateral primary osteoarthritis of knee: Secondary | ICD-10-CM

## 2022-08-04 DIAGNOSIS — M170 Bilateral primary osteoarthritis of knee: Secondary | ICD-10-CM | POA: Diagnosis not present

## 2022-08-04 DIAGNOSIS — E1142 Type 2 diabetes mellitus with diabetic polyneuropathy: Secondary | ICD-10-CM | POA: Diagnosis not present

## 2022-08-04 DIAGNOSIS — I509 Heart failure, unspecified: Secondary | ICD-10-CM | POA: Diagnosis not present

## 2022-08-04 DIAGNOSIS — E1122 Type 2 diabetes mellitus with diabetic chronic kidney disease: Secondary | ICD-10-CM | POA: Diagnosis not present

## 2022-08-04 DIAGNOSIS — E7800 Pure hypercholesterolemia, unspecified: Secondary | ICD-10-CM | POA: Diagnosis not present

## 2022-08-04 DIAGNOSIS — I2510 Atherosclerotic heart disease of native coronary artery without angina pectoris: Secondary | ICD-10-CM | POA: Diagnosis not present

## 2022-08-04 DIAGNOSIS — M545 Low back pain, unspecified: Secondary | ICD-10-CM | POA: Diagnosis not present

## 2022-08-04 DIAGNOSIS — I130 Hypertensive heart and chronic kidney disease with heart failure and stage 1 through stage 4 chronic kidney disease, or unspecified chronic kidney disease: Secondary | ICD-10-CM | POA: Diagnosis not present

## 2022-08-04 DIAGNOSIS — D631 Anemia in chronic kidney disease: Secondary | ICD-10-CM | POA: Diagnosis not present

## 2022-08-07 ENCOUNTER — Telehealth: Payer: Self-pay | Admitting: Internal Medicine

## 2022-08-07 ENCOUNTER — Telehealth: Payer: Self-pay

## 2022-08-07 NOTE — Telephone Encounter (Signed)
Rebecca [name] from [facility] requested we change the pt's evaluation from this afternoon to the next medicare date, 6.4.24, Please call the number below to confirm, VM is secure.  [phone]

## 2022-08-07 NOTE — Telephone Encounter (Signed)
Spoke with Melissa.  She is with [facility] OT and wanted verbal to change appointment.  Verbal given.

## 2022-08-07 NOTE — Patient Outreach (Signed)
  Care Coordination   Follow Up Visit Note   08/07/2022 Name: KERSTYN FU MRN: [id number] DOB: December 03, 1932  [name] is a 89 y.o. year old female who sees Burns, [name], MD for primary care. I spoke with  [name] by phone today.  What matters to the patients health and wellness today?  RNCM returned call to patient from voice message left on 08/03/22 with question regarding copay for home health therapy. Ms. Rosenkrantz reports she has been in contact with agency and that she does not have a copay. She states she has had evaluation and is awaiting the start of therapy. Ms. Yano is without any questions or concerns at this time.  Goals Addressed             This Visit's Progress    Care Coordination activities       Interventions Today    Flowsheet Row Most Recent Value  General Interventions   General Interventions Discussed/Reviewed General Interventions Reviewed  [confirmed patient has been in contact with home health agency for therapy.]            SDOH assessments and interventions completed:  No  Care Coordination Interventions:  Yes, provided   Follow up plan:  follow up as previously scheduled    Encounter Outcome:  Pt. Visit Completed   Kathyrn Sheriff, RN, MSN, BSN, CCM [facility] Care Coordinator [phone]

## 2022-08-09 DIAGNOSIS — D631 Anemia in chronic kidney disease: Secondary | ICD-10-CM | POA: Diagnosis not present

## 2022-08-09 DIAGNOSIS — I509 Heart failure, unspecified: Secondary | ICD-10-CM | POA: Diagnosis not present

## 2022-08-09 DIAGNOSIS — I130 Hypertensive heart and chronic kidney disease with heart failure and stage 1 through stage 4 chronic kidney disease, or unspecified chronic kidney disease: Secondary | ICD-10-CM | POA: Diagnosis not present

## 2022-08-09 DIAGNOSIS — E7800 Pure hypercholesterolemia, unspecified: Secondary | ICD-10-CM | POA: Diagnosis not present

## 2022-08-09 DIAGNOSIS — M170 Bilateral primary osteoarthritis of knee: Secondary | ICD-10-CM | POA: Diagnosis not present

## 2022-08-09 DIAGNOSIS — E1142 Type 2 diabetes mellitus with diabetic polyneuropathy: Secondary | ICD-10-CM | POA: Diagnosis not present

## 2022-08-09 DIAGNOSIS — I2510 Atherosclerotic heart disease of native coronary artery without angina pectoris: Secondary | ICD-10-CM | POA: Diagnosis not present

## 2022-08-09 DIAGNOSIS — M545 Low back pain, unspecified: Secondary | ICD-10-CM | POA: Diagnosis not present

## 2022-08-09 DIAGNOSIS — E1122 Type 2 diabetes mellitus with diabetic chronic kidney disease: Secondary | ICD-10-CM | POA: Diagnosis not present

## 2022-08-13 DIAGNOSIS — Z7901 Long term (current) use of anticoagulants: Secondary | ICD-10-CM

## 2022-08-13 DIAGNOSIS — E7800 Pure hypercholesterolemia, unspecified: Secondary | ICD-10-CM | POA: Diagnosis not present

## 2022-08-13 DIAGNOSIS — E1139 Type 2 diabetes mellitus with other diabetic ophthalmic complication: Secondary | ICD-10-CM

## 2022-08-13 DIAGNOSIS — M545 Low back pain, unspecified: Secondary | ICD-10-CM | POA: Diagnosis not present

## 2022-08-13 DIAGNOSIS — H548 Legal blindness, as defined in USA: Secondary | ICD-10-CM

## 2022-08-13 DIAGNOSIS — I509 Heart failure, unspecified: Secondary | ICD-10-CM | POA: Diagnosis not present

## 2022-08-13 DIAGNOSIS — I2510 Atherosclerotic heart disease of native coronary artery without angina pectoris: Secondary | ICD-10-CM | POA: Diagnosis not present

## 2022-08-13 DIAGNOSIS — I480 Paroxysmal atrial fibrillation: Secondary | ICD-10-CM

## 2022-08-13 DIAGNOSIS — E0501 Thyrotoxicosis with diffuse goiter with thyrotoxic crisis or storm: Secondary | ICD-10-CM

## 2022-08-13 DIAGNOSIS — I130 Hypertensive heart and chronic kidney disease with heart failure and stage 1 through stage 4 chronic kidney disease, or unspecified chronic kidney disease: Secondary | ICD-10-CM | POA: Diagnosis not present

## 2022-08-13 DIAGNOSIS — K219 Gastro-esophageal reflux disease without esophagitis: Secondary | ICD-10-CM

## 2022-08-13 DIAGNOSIS — Z7985 Long-term (current) use of injectable non-insulin antidiabetic drugs: Secondary | ICD-10-CM

## 2022-08-13 DIAGNOSIS — D631 Anemia in chronic kidney disease: Secondary | ICD-10-CM | POA: Diagnosis not present

## 2022-08-13 DIAGNOSIS — E215 Disorder of parathyroid gland, unspecified: Secondary | ICD-10-CM

## 2022-08-13 DIAGNOSIS — H9193 Unspecified hearing loss, bilateral: Secondary | ICD-10-CM

## 2022-08-13 DIAGNOSIS — Z794 Long term (current) use of insulin: Secondary | ICD-10-CM

## 2022-08-13 DIAGNOSIS — E1142 Type 2 diabetes mellitus with diabetic polyneuropathy: Secondary | ICD-10-CM | POA: Diagnosis not present

## 2022-08-13 DIAGNOSIS — K5900 Constipation, unspecified: Secondary | ICD-10-CM

## 2022-08-13 DIAGNOSIS — M170 Bilateral primary osteoarthritis of knee: Secondary | ICD-10-CM | POA: Diagnosis not present

## 2022-08-13 DIAGNOSIS — E1122 Type 2 diabetes mellitus with diabetic chronic kidney disease: Secondary | ICD-10-CM | POA: Diagnosis not present

## 2022-08-14 ENCOUNTER — Telehealth: Payer: Self-pay | Admitting: Internal Medicine

## 2022-08-14 DIAGNOSIS — E7800 Pure hypercholesterolemia, unspecified: Secondary | ICD-10-CM | POA: Diagnosis not present

## 2022-08-14 DIAGNOSIS — I130 Hypertensive heart and chronic kidney disease with heart failure and stage 1 through stage 4 chronic kidney disease, or unspecified chronic kidney disease: Secondary | ICD-10-CM | POA: Diagnosis not present

## 2022-08-14 DIAGNOSIS — M170 Bilateral primary osteoarthritis of knee: Secondary | ICD-10-CM | POA: Diagnosis not present

## 2022-08-14 DIAGNOSIS — E1122 Type 2 diabetes mellitus with diabetic chronic kidney disease: Secondary | ICD-10-CM | POA: Diagnosis not present

## 2022-08-14 DIAGNOSIS — I2510 Atherosclerotic heart disease of native coronary artery without angina pectoris: Secondary | ICD-10-CM | POA: Diagnosis not present

## 2022-08-14 DIAGNOSIS — I509 Heart failure, unspecified: Secondary | ICD-10-CM | POA: Diagnosis not present

## 2022-08-14 DIAGNOSIS — M545 Low back pain, unspecified: Secondary | ICD-10-CM | POA: Diagnosis not present

## 2022-08-14 DIAGNOSIS — D631 Anemia in chronic kidney disease: Secondary | ICD-10-CM | POA: Diagnosis not present

## 2022-08-14 DIAGNOSIS — E1142 Type 2 diabetes mellitus with diabetic polyneuropathy: Secondary | ICD-10-CM | POA: Diagnosis not present

## 2022-08-14 NOTE — Telephone Encounter (Signed)
Verbal Orders  Social work F/U for 2 weeks only for 1 time Child psychotherapist will be trying to connect with home health care agent.

## 2022-08-14 NOTE — Telephone Encounter (Signed)
Okay for orders? 

## 2022-08-16 ENCOUNTER — Ambulatory Visit: Payer: Self-pay

## 2022-08-16 DIAGNOSIS — E7800 Pure hypercholesterolemia, unspecified: Secondary | ICD-10-CM | POA: Diagnosis not present

## 2022-08-16 DIAGNOSIS — I130 Hypertensive heart and chronic kidney disease with heart failure and stage 1 through stage 4 chronic kidney disease, or unspecified chronic kidney disease: Secondary | ICD-10-CM | POA: Diagnosis not present

## 2022-08-16 DIAGNOSIS — M170 Bilateral primary osteoarthritis of knee: Secondary | ICD-10-CM | POA: Diagnosis not present

## 2022-08-16 DIAGNOSIS — D631 Anemia in chronic kidney disease: Secondary | ICD-10-CM | POA: Diagnosis not present

## 2022-08-16 DIAGNOSIS — I509 Heart failure, unspecified: Secondary | ICD-10-CM | POA: Diagnosis not present

## 2022-08-16 DIAGNOSIS — E1142 Type 2 diabetes mellitus with diabetic polyneuropathy: Secondary | ICD-10-CM | POA: Diagnosis not present

## 2022-08-16 DIAGNOSIS — M545 Low back pain, unspecified: Secondary | ICD-10-CM | POA: Diagnosis not present

## 2022-08-16 DIAGNOSIS — I2510 Atherosclerotic heart disease of native coronary artery without angina pectoris: Secondary | ICD-10-CM | POA: Diagnosis not present

## 2022-08-16 DIAGNOSIS — E1122 Type 2 diabetes mellitus with diabetic chronic kidney disease: Secondary | ICD-10-CM | POA: Diagnosis not present

## 2022-08-16 NOTE — Patient Outreach (Signed)
  Care Coordination   Follow Up Visit Note   08/16/2022 Name: Rebecca [name] MRN: [id number] DOB: August 20, 1932  Rebecca [name] is a 89 y.o. year old female who sees Burns, [name], MD for primary care. I spoke with  Rebecca [name] by phone today.  What matters to the patients health and wellness today?  Rebecca [name] confirmed that she is now active with home health, [facility], for PT, bath aid, Child psychotherapist. She reports she received a letter from [facility] and states the social worker from home health is scheduled to come see her to review and assist as needed. Rebecca [name] reports no issues with blood pressure or blood sugars. She states Blood sugar today was 96. She expresses she has an eye exam with Dr. Zenaida Niece, opthamologist, scheduled for July. She denies any problems or concerns at this time. Patient to contact RNCM if care coordination needs in the future.   Goals Addressed             This Visit's Progress    COMPLETED: Care Coordination activities       Interventions Today    Flowsheet Row Most Recent Value  Chronic Disease   Chronic disease during today's visit Diabetes, Hypertension (HTN)  General Interventions   General Interventions Discussed/Reviewed General Interventions Reviewed, Doctor Visits  [Encouraged patient to contact RNCM for care coordination needs as needed. confirmed she has RNCM contact number.]  Doctor Visits Discussed/Reviewed Doctor Visits Discussed  [reviewed upcoming appointments and confirmed patient has transportation to appointment]  Exercise Interventions   Exercise Discussed/Reviewed Exercise Reviewed  [Patient active with [facility]-encouraged to perform exercises as recommended by therapy]  Education Interventions   Education Provided Provided Education  [reiterated pending eye doctor assessment of vision encouraged patient to discuss [place] Child psychotherapist for the blind resources with her eye doctor and  home health social worker if  needed/eligible]  Provided Verbal Education On [employer], Medication, Exercise, Blood Sugar Monitoring, Other, Nutrition, Eye Care, When to see the doctor  [advised to attend provider visit as scheduled and as needed, encouraged to know insurance benefits and use the OTC benefit, confirmed she has an eye exam scheduled, encouraged to take medications as prescribed]  Nutrition Interventions   Nutrition Discussed/Reviewed Nutrition Reviewed  Pharmacy Interventions   Pharmacy Dicussed/Reviewed Pharmacy Topics Reviewed  Safety Interventions   Safety Discussed/Reviewed Safety Reviewed            SDOH assessments and interventions completed:  No  Care Coordination Interventions:  Yes, provided   Follow up plan: No further intervention required.   Encounter Outcome:  Pt. Visit Completed   Kathyrn Sheriff, RN, MSN, BSN, CCM [facility] Care Coordinator [phone]

## 2022-08-16 NOTE — Patient Instructions (Signed)
Visit Information  Thank you for taking time to visit with me today. Please don't hesitate to contact me if I can be of assistance to you.   Following are the goals we discussed today:  Attend provider visits as scheduled/recommended Continue to take medications as scheduled Perform exercises as recommended by therapist Continue to take medications as prescribed Continue fall prevention strategies Contact your provider with health questions or concerns   If you are experiencing a Mental Health or Behavioral Health Crisis or need someone to talk to, please call the Suicide and Crisis Lifeline: 83  Kathyrn Sheriff, RN, MSN, BSN, CCM [facility] Care Coordinator [phone]

## 2022-08-17 ENCOUNTER — Encounter: Payer: Self-pay | Admitting: Internal Medicine

## 2022-08-17 NOTE — Progress Notes (Signed)
Outside notes received. Information abstracted. Notes sent to scan.  

## 2022-08-20 ENCOUNTER — Telehealth: Payer: Self-pay | Admitting: Internal Medicine

## 2022-08-20 DIAGNOSIS — E1142 Type 2 diabetes mellitus with diabetic polyneuropathy: Secondary | ICD-10-CM | POA: Diagnosis not present

## 2022-08-20 DIAGNOSIS — M170 Bilateral primary osteoarthritis of knee: Secondary | ICD-10-CM | POA: Diagnosis not present

## 2022-08-20 DIAGNOSIS — D631 Anemia in chronic kidney disease: Secondary | ICD-10-CM | POA: Diagnosis not present

## 2022-08-20 DIAGNOSIS — E7800 Pure hypercholesterolemia, unspecified: Secondary | ICD-10-CM | POA: Diagnosis not present

## 2022-08-20 DIAGNOSIS — I509 Heart failure, unspecified: Secondary | ICD-10-CM | POA: Diagnosis not present

## 2022-08-20 DIAGNOSIS — I130 Hypertensive heart and chronic kidney disease with heart failure and stage 1 through stage 4 chronic kidney disease, or unspecified chronic kidney disease: Secondary | ICD-10-CM | POA: Diagnosis not present

## 2022-08-20 DIAGNOSIS — M545 Low back pain, unspecified: Secondary | ICD-10-CM | POA: Diagnosis not present

## 2022-08-20 DIAGNOSIS — I2510 Atherosclerotic heart disease of native coronary artery without angina pectoris: Secondary | ICD-10-CM | POA: Diagnosis not present

## 2022-08-20 DIAGNOSIS — E1122 Type 2 diabetes mellitus with diabetic chronic kidney disease: Secondary | ICD-10-CM | POA: Diagnosis not present

## 2022-08-20 NOTE — Telephone Encounter (Signed)
Patient called to let Dr. Lawerance Bach know that her current BP is 115/60 and that her pulse was 52. She would like a call back to discuss those levels. Best callback is [phone].

## 2022-08-21 DIAGNOSIS — I509 Heart failure, unspecified: Secondary | ICD-10-CM | POA: Diagnosis not present

## 2022-08-21 DIAGNOSIS — E1142 Type 2 diabetes mellitus with diabetic polyneuropathy: Secondary | ICD-10-CM | POA: Diagnosis not present

## 2022-08-21 DIAGNOSIS — M170 Bilateral primary osteoarthritis of knee: Secondary | ICD-10-CM | POA: Diagnosis not present

## 2022-08-21 DIAGNOSIS — D631 Anemia in chronic kidney disease: Secondary | ICD-10-CM | POA: Diagnosis not present

## 2022-08-21 DIAGNOSIS — I2510 Atherosclerotic heart disease of native coronary artery without angina pectoris: Secondary | ICD-10-CM | POA: Diagnosis not present

## 2022-08-21 DIAGNOSIS — E7800 Pure hypercholesterolemia, unspecified: Secondary | ICD-10-CM | POA: Diagnosis not present

## 2022-08-21 DIAGNOSIS — E1122 Type 2 diabetes mellitus with diabetic chronic kidney disease: Secondary | ICD-10-CM | POA: Diagnosis not present

## 2022-08-21 DIAGNOSIS — M545 Low back pain, unspecified: Secondary | ICD-10-CM | POA: Diagnosis not present

## 2022-08-21 DIAGNOSIS — I130 Hypertensive heart and chronic kidney disease with heart failure and stage 1 through stage 4 chronic kidney disease, or unspecified chronic kidney disease: Secondary | ICD-10-CM | POA: Diagnosis not present

## 2022-08-21 NOTE — Telephone Encounter (Signed)
That is ok - continue to monitor - I need more readings to be able to know how her BP,HR really is = have her contineu to monitor and call back next week with readings

## 2022-08-22 NOTE — Telephone Encounter (Signed)
Spoke with patient today.  She will call next week to report blood pressure readings.

## 2022-08-23 DIAGNOSIS — M545 Low back pain, unspecified: Secondary | ICD-10-CM | POA: Diagnosis not present

## 2022-08-23 DIAGNOSIS — E1122 Type 2 diabetes mellitus with diabetic chronic kidney disease: Secondary | ICD-10-CM | POA: Diagnosis not present

## 2022-08-23 DIAGNOSIS — I2510 Atherosclerotic heart disease of native coronary artery without angina pectoris: Secondary | ICD-10-CM | POA: Diagnosis not present

## 2022-08-23 DIAGNOSIS — E1142 Type 2 diabetes mellitus with diabetic polyneuropathy: Secondary | ICD-10-CM | POA: Diagnosis not present

## 2022-08-23 DIAGNOSIS — D631 Anemia in chronic kidney disease: Secondary | ICD-10-CM | POA: Diagnosis not present

## 2022-08-23 DIAGNOSIS — I509 Heart failure, unspecified: Secondary | ICD-10-CM | POA: Diagnosis not present

## 2022-08-23 DIAGNOSIS — E7800 Pure hypercholesterolemia, unspecified: Secondary | ICD-10-CM | POA: Diagnosis not present

## 2022-08-23 DIAGNOSIS — M170 Bilateral primary osteoarthritis of knee: Secondary | ICD-10-CM | POA: Diagnosis not present

## 2022-08-23 DIAGNOSIS — I130 Hypertensive heart and chronic kidney disease with heart failure and stage 1 through stage 4 chronic kidney disease, or unspecified chronic kidney disease: Secondary | ICD-10-CM | POA: Diagnosis not present

## 2022-08-28 DIAGNOSIS — I130 Hypertensive heart and chronic kidney disease with heart failure and stage 1 through stage 4 chronic kidney disease, or unspecified chronic kidney disease: Secondary | ICD-10-CM | POA: Diagnosis not present

## 2022-08-28 DIAGNOSIS — M545 Low back pain, unspecified: Secondary | ICD-10-CM | POA: Diagnosis not present

## 2022-08-28 DIAGNOSIS — I2510 Atherosclerotic heart disease of native coronary artery without angina pectoris: Secondary | ICD-10-CM | POA: Diagnosis not present

## 2022-08-28 DIAGNOSIS — E1122 Type 2 diabetes mellitus with diabetic chronic kidney disease: Secondary | ICD-10-CM | POA: Diagnosis not present

## 2022-08-28 DIAGNOSIS — E7800 Pure hypercholesterolemia, unspecified: Secondary | ICD-10-CM | POA: Diagnosis not present

## 2022-08-28 DIAGNOSIS — M170 Bilateral primary osteoarthritis of knee: Secondary | ICD-10-CM | POA: Diagnosis not present

## 2022-08-28 DIAGNOSIS — E1142 Type 2 diabetes mellitus with diabetic polyneuropathy: Secondary | ICD-10-CM | POA: Diagnosis not present

## 2022-08-28 DIAGNOSIS — D631 Anemia in chronic kidney disease: Secondary | ICD-10-CM | POA: Diagnosis not present

## 2022-08-28 DIAGNOSIS — I509 Heart failure, unspecified: Secondary | ICD-10-CM | POA: Diagnosis not present

## 2022-08-29 ENCOUNTER — Telehealth: Payer: Self-pay | Admitting: Internal Medicine

## 2022-08-29 NOTE — Telephone Encounter (Signed)
Patient called to inform Dr. Lawerance Bach of her blood pressure readings. Dates and readings are below:  08/14/2022 : 108/61/71 08/16/2022 : 110/50 08/18/2022: 120/50 08/19/2022 : 129/60/58 08/20/2022 : 115/60 08/21/2022 : 120/58/55  08/28/2022 : 120/54

## 2022-08-30 NOTE — Telephone Encounter (Signed)
Hold lisinopril for 1 day and then start at half dose or 20 mg daily and monitor BP   [Med list updated to reflect 20 mg of lisinopril instead of 40]

## 2022-08-30 NOTE — Telephone Encounter (Signed)
BP well controlled at home.  

## 2022-08-30 NOTE — Telephone Encounter (Signed)
08/30/2022 reading 93/44 and 93/47 Pulse at 59... Pt states she is going to nt taker bp pill tomorrow to see if this will help with this low reading. I informed pt I will inform her pcp and the nurse will get back to her with further instructions.

## 2022-08-30 NOTE — Addendum Note (Signed)
Addended by: Pincus Sanes on: 08/30/2022 04:54 PM   Modules accepted: Orders

## 2022-08-31 DIAGNOSIS — I130 Hypertensive heart and chronic kidney disease with heart failure and stage 1 through stage 4 chronic kidney disease, or unspecified chronic kidney disease: Secondary | ICD-10-CM | POA: Diagnosis not present

## 2022-08-31 DIAGNOSIS — I509 Heart failure, unspecified: Secondary | ICD-10-CM | POA: Diagnosis not present

## 2022-08-31 DIAGNOSIS — M170 Bilateral primary osteoarthritis of knee: Secondary | ICD-10-CM | POA: Diagnosis not present

## 2022-08-31 DIAGNOSIS — M545 Low back pain, unspecified: Secondary | ICD-10-CM | POA: Diagnosis not present

## 2022-08-31 DIAGNOSIS — I2510 Atherosclerotic heart disease of native coronary artery without angina pectoris: Secondary | ICD-10-CM | POA: Diagnosis not present

## 2022-08-31 DIAGNOSIS — D631 Anemia in chronic kidney disease: Secondary | ICD-10-CM | POA: Diagnosis not present

## 2022-08-31 DIAGNOSIS — E7800 Pure hypercholesterolemia, unspecified: Secondary | ICD-10-CM | POA: Diagnosis not present

## 2022-08-31 DIAGNOSIS — E1142 Type 2 diabetes mellitus with diabetic polyneuropathy: Secondary | ICD-10-CM | POA: Diagnosis not present

## 2022-08-31 DIAGNOSIS — E1122 Type 2 diabetes mellitus with diabetic chronic kidney disease: Secondary | ICD-10-CM | POA: Diagnosis not present

## 2022-08-31 NOTE — Telephone Encounter (Signed)
Spoke with pt and she understood 2 stop taking the lisinopril for 1 day and then half or 20mg  daily and to continue monitoring her bp and give [place] an update.

## 2022-09-04 ENCOUNTER — Other Ambulatory Visit: Payer: Self-pay | Admitting: Internal Medicine

## 2022-09-04 DIAGNOSIS — I2510 Atherosclerotic heart disease of native coronary artery without angina pectoris: Secondary | ICD-10-CM | POA: Diagnosis not present

## 2022-09-04 DIAGNOSIS — I130 Hypertensive heart and chronic kidney disease with heart failure and stage 1 through stage 4 chronic kidney disease, or unspecified chronic kidney disease: Secondary | ICD-10-CM | POA: Diagnosis not present

## 2022-09-04 DIAGNOSIS — M170 Bilateral primary osteoarthritis of knee: Secondary | ICD-10-CM | POA: Diagnosis not present

## 2022-09-04 DIAGNOSIS — E1142 Type 2 diabetes mellitus with diabetic polyneuropathy: Secondary | ICD-10-CM | POA: Diagnosis not present

## 2022-09-04 DIAGNOSIS — D631 Anemia in chronic kidney disease: Secondary | ICD-10-CM | POA: Diagnosis not present

## 2022-09-04 DIAGNOSIS — E1122 Type 2 diabetes mellitus with diabetic chronic kidney disease: Secondary | ICD-10-CM | POA: Diagnosis not present

## 2022-09-04 DIAGNOSIS — I509 Heart failure, unspecified: Secondary | ICD-10-CM | POA: Diagnosis not present

## 2022-09-04 DIAGNOSIS — E7800 Pure hypercholesterolemia, unspecified: Secondary | ICD-10-CM | POA: Diagnosis not present

## 2022-09-04 DIAGNOSIS — M545 Low back pain, unspecified: Secondary | ICD-10-CM | POA: Diagnosis not present

## 2022-09-05 ENCOUNTER — Other Ambulatory Visit: Payer: Self-pay | Admitting: Internal Medicine

## 2022-09-06 DIAGNOSIS — E1122 Type 2 diabetes mellitus with diabetic chronic kidney disease: Secondary | ICD-10-CM | POA: Diagnosis not present

## 2022-09-06 DIAGNOSIS — D631 Anemia in chronic kidney disease: Secondary | ICD-10-CM | POA: Diagnosis not present

## 2022-09-06 DIAGNOSIS — I2510 Atherosclerotic heart disease of native coronary artery without angina pectoris: Secondary | ICD-10-CM | POA: Diagnosis not present

## 2022-09-06 DIAGNOSIS — E7800 Pure hypercholesterolemia, unspecified: Secondary | ICD-10-CM | POA: Diagnosis not present

## 2022-09-06 DIAGNOSIS — M545 Low back pain, unspecified: Secondary | ICD-10-CM | POA: Diagnosis not present

## 2022-09-06 DIAGNOSIS — I130 Hypertensive heart and chronic kidney disease with heart failure and stage 1 through stage 4 chronic kidney disease, or unspecified chronic kidney disease: Secondary | ICD-10-CM | POA: Diagnosis not present

## 2022-09-06 DIAGNOSIS — I509 Heart failure, unspecified: Secondary | ICD-10-CM | POA: Diagnosis not present

## 2022-09-06 DIAGNOSIS — E1142 Type 2 diabetes mellitus with diabetic polyneuropathy: Secondary | ICD-10-CM | POA: Diagnosis not present

## 2022-09-06 DIAGNOSIS — M170 Bilateral primary osteoarthritis of knee: Secondary | ICD-10-CM | POA: Diagnosis not present

## 2022-09-06 MED ORDER — LISINOPRIL 40 MG PO TABS: 20.0000 mg | ORAL_TABLET | Freq: Every day | ORAL | 2 refills | Status: DC

## 2022-09-06 NOTE — Addendum Note (Signed)
Addended by: Pincus Sanes on: 09/06/2022 07:09 AM   Modules accepted: Orders

## 2022-09-11 ENCOUNTER — Other Ambulatory Visit: Payer: Self-pay

## 2022-09-11 ENCOUNTER — Telehealth: Payer: Self-pay | Admitting: Internal Medicine

## 2022-09-11 DIAGNOSIS — I130 Hypertensive heart and chronic kidney disease with heart failure and stage 1 through stage 4 chronic kidney disease, or unspecified chronic kidney disease: Secondary | ICD-10-CM | POA: Diagnosis not present

## 2022-09-11 DIAGNOSIS — D631 Anemia in chronic kidney disease: Secondary | ICD-10-CM | POA: Diagnosis not present

## 2022-09-11 DIAGNOSIS — I2510 Atherosclerotic heart disease of native coronary artery without angina pectoris: Secondary | ICD-10-CM | POA: Diagnosis not present

## 2022-09-11 DIAGNOSIS — E1142 Type 2 diabetes mellitus with diabetic polyneuropathy: Secondary | ICD-10-CM

## 2022-09-11 DIAGNOSIS — M170 Bilateral primary osteoarthritis of knee: Secondary | ICD-10-CM | POA: Diagnosis not present

## 2022-09-11 DIAGNOSIS — E7800 Pure hypercholesterolemia, unspecified: Secondary | ICD-10-CM | POA: Diagnosis not present

## 2022-09-11 DIAGNOSIS — M545 Low back pain, unspecified: Secondary | ICD-10-CM | POA: Diagnosis not present

## 2022-09-11 DIAGNOSIS — I509 Heart failure, unspecified: Secondary | ICD-10-CM | POA: Diagnosis not present

## 2022-09-11 DIAGNOSIS — E1122 Type 2 diabetes mellitus with diabetic chronic kidney disease: Secondary | ICD-10-CM | POA: Diagnosis not present

## 2022-09-11 MED ORDER — DULAGLUTIDE 4.5 MG/0.5ML ~~LOC~~ SOAJ
4.5000 mg | SUBCUTANEOUS | 0 refills | Status: DC
Start: 2022-09-11 — End: 2022-10-10

## 2022-09-11 MED ORDER — NOVOLOG FLEXPEN 100 UNIT/ML ~~LOC~~ SOPN
PEN_INJECTOR | SUBCUTANEOUS | 3 refills | Status: AC
Start: 2022-09-11 — End: ?

## 2022-09-11 NOTE — Telephone Encounter (Signed)
Refills sent in and next dose sent in for the 4.5 mg.

## 2022-09-11 NOTE — Telephone Encounter (Signed)
Prescription Request  09/11/2022 Pt had mentioned her and Dr Lawerance Bach had spoken about upping her dosage for Trulicity to 4 mg LOV: 07/27/2022  What is the name of the medication or equipment? NOVOLOG FLEXPEN 100 UNIT/ML FlexPen  Dulaglutide (TRULICITY) 3 MG/0.5ML SOPN   Have you contacted your pharmacy to request a refill? No   Which pharmacy would you like this sent to?    [facility] - [place], [place] - [address] [place] [address] Phone: [phone] Fax: [phone]   Patient notified that their request is being sent to the clinical staff for review and that they should receive a response within 2 business days.   Please advise at Mobile There is no such number on file (mobile).

## 2022-09-18 ENCOUNTER — Telehealth: Payer: Self-pay | Admitting: Internal Medicine

## 2022-09-18 DIAGNOSIS — E1122 Type 2 diabetes mellitus with diabetic chronic kidney disease: Secondary | ICD-10-CM | POA: Diagnosis not present

## 2022-09-18 DIAGNOSIS — M545 Low back pain, unspecified: Secondary | ICD-10-CM | POA: Diagnosis not present

## 2022-09-18 DIAGNOSIS — I130 Hypertensive heart and chronic kidney disease with heart failure and stage 1 through stage 4 chronic kidney disease, or unspecified chronic kidney disease: Secondary | ICD-10-CM | POA: Diagnosis not present

## 2022-09-18 DIAGNOSIS — I509 Heart failure, unspecified: Secondary | ICD-10-CM | POA: Diagnosis not present

## 2022-09-18 DIAGNOSIS — E7800 Pure hypercholesterolemia, unspecified: Secondary | ICD-10-CM | POA: Diagnosis not present

## 2022-09-18 DIAGNOSIS — D631 Anemia in chronic kidney disease: Secondary | ICD-10-CM | POA: Diagnosis not present

## 2022-09-18 DIAGNOSIS — M170 Bilateral primary osteoarthritis of knee: Secondary | ICD-10-CM | POA: Diagnosis not present

## 2022-09-18 DIAGNOSIS — I2510 Atherosclerotic heart disease of native coronary artery without angina pectoris: Secondary | ICD-10-CM | POA: Diagnosis not present

## 2022-09-18 DIAGNOSIS — E1142 Type 2 diabetes mellitus with diabetic polyneuropathy: Secondary | ICD-10-CM | POA: Diagnosis not present

## 2022-09-18 NOTE — Telephone Encounter (Signed)
She does the dosing based on how many carbs she eats.  It is a sliding scale.

## 2022-09-18 NOTE — Telephone Encounter (Signed)
[facility] pharmacy called and requested directions for insulin aspart (NOVOLOG FLEXPEN) 100 UNIT/ML FlexPen. Best callback for them is [phone].

## 2022-09-20 ENCOUNTER — Other Ambulatory Visit: Payer: Self-pay | Admitting: Internal Medicine

## 2022-09-20 DIAGNOSIS — M545 Low back pain, unspecified: Secondary | ICD-10-CM | POA: Diagnosis not present

## 2022-09-20 DIAGNOSIS — M170 Bilateral primary osteoarthritis of knee: Secondary | ICD-10-CM | POA: Diagnosis not present

## 2022-09-20 DIAGNOSIS — E1142 Type 2 diabetes mellitus with diabetic polyneuropathy: Secondary | ICD-10-CM | POA: Diagnosis not present

## 2022-09-20 DIAGNOSIS — I2510 Atherosclerotic heart disease of native coronary artery without angina pectoris: Secondary | ICD-10-CM | POA: Diagnosis not present

## 2022-09-20 DIAGNOSIS — I509 Heart failure, unspecified: Secondary | ICD-10-CM | POA: Diagnosis not present

## 2022-09-20 DIAGNOSIS — E1122 Type 2 diabetes mellitus with diabetic chronic kidney disease: Secondary | ICD-10-CM | POA: Diagnosis not present

## 2022-09-20 DIAGNOSIS — E7800 Pure hypercholesterolemia, unspecified: Secondary | ICD-10-CM | POA: Diagnosis not present

## 2022-09-20 DIAGNOSIS — I130 Hypertensive heart and chronic kidney disease with heart failure and stage 1 through stage 4 chronic kidney disease, or unspecified chronic kidney disease: Secondary | ICD-10-CM | POA: Diagnosis not present

## 2022-09-20 DIAGNOSIS — D631 Anemia in chronic kidney disease: Secondary | ICD-10-CM | POA: Diagnosis not present

## 2022-09-20 NOTE — Telephone Encounter (Signed)
Spoke with Mia in the pharmacy and sliding scale given for patient to take max of 45 ml per day (15 units TID) based on glucose numbers.

## 2022-09-23 ENCOUNTER — Other Ambulatory Visit: Payer: Self-pay

## 2022-09-23 ENCOUNTER — Encounter (HOSPITAL_COMMUNITY): Payer: Self-pay

## 2022-09-23 ENCOUNTER — Emergency Department (HOSPITAL_COMMUNITY)
Admission: EM | Admit: 2022-09-23 | Discharge: 2022-09-23 | Disposition: A | Discharge status: Home / Self Care | Payer: Medicare HMO | Source: Home / Self Care | Attending: Emergency Medicine | Admitting: Emergency Medicine

## 2022-09-23 ENCOUNTER — Emergency Department (HOSPITAL_COMMUNITY): Payer: Medicare HMO

## 2022-09-23 DIAGNOSIS — E119 Type 2 diabetes mellitus without complications: Secondary | ICD-10-CM | POA: Insufficient documentation

## 2022-09-23 DIAGNOSIS — Z7901 Long term (current) use of anticoagulants: Secondary | ICD-10-CM | POA: Insufficient documentation

## 2022-09-23 DIAGNOSIS — W07XXXA Fall from chair, initial encounter: Secondary | ICD-10-CM | POA: Insufficient documentation

## 2022-09-23 DIAGNOSIS — W19XXXA Unspecified fall, initial encounter: Secondary | ICD-10-CM

## 2022-09-23 DIAGNOSIS — Z794 Long term (current) use of insulin: Secondary | ICD-10-CM | POA: Insufficient documentation

## 2022-09-23 DIAGNOSIS — S7002XA Contusion of left hip, initial encounter: Secondary | ICD-10-CM | POA: Diagnosis not present

## 2022-09-23 DIAGNOSIS — I10 Essential (primary) hypertension: Secondary | ICD-10-CM | POA: Diagnosis not present

## 2022-09-23 DIAGNOSIS — M47816 Spondylosis without myelopathy or radiculopathy, lumbar region: Secondary | ICD-10-CM | POA: Diagnosis not present

## 2022-09-23 DIAGNOSIS — M85852 Other specified disorders of bone density and structure, left thigh: Secondary | ICD-10-CM | POA: Diagnosis not present

## 2022-09-23 DIAGNOSIS — Z043 Encounter for examination and observation following other accident: Secondary | ICD-10-CM | POA: Diagnosis not present

## 2022-09-23 LAB — CBC WITH DIFFERENTIAL/PLATELET
Abs Immature Granulocytes: 0.01 10*3/uL (ref 0.00–0.07)
Basophils Absolute: 0.1 10*3/uL (ref 0.0–0.1)
Basophils Relative: 2 %
Eosinophils Absolute: 0.1 10*3/uL (ref 0.0–0.5)
Eosinophils Relative: 3 %
HCT: 27.2 % — ABNORMAL LOW (ref 36.0–46.0)
Hemoglobin: 8.9 g/dL — ABNORMAL LOW (ref 12.0–15.0)
Immature Granulocytes: 0 %
Lymphocytes Relative: 31 %
Lymphs Abs: 1.3 10*3/uL (ref 0.7–4.0)
MCH: 26.9 pg (ref 26.0–34.0)
MCHC: 32.7 g/dL (ref 30.0–36.0)
MCV: 82.2 fL (ref 80.0–100.0)
Monocytes Absolute: 0.8 10*3/uL (ref 0.1–1.0)
Monocytes Relative: 18 %
Neutro Abs: 1.9 10*3/uL (ref 1.7–7.7)
Neutrophils Relative %: 46 %
Platelets: 190 10*3/uL (ref 150–400)
RBC: 3.31 MIL/uL — ABNORMAL LOW (ref 3.87–5.11)
RDW: 15.8 % — ABNORMAL HIGH (ref 11.5–15.5)
WBC: 4.2 10*3/uL (ref 4.0–10.5)
nRBC: 0 % (ref 0.0–0.2)

## 2022-09-23 LAB — COMPREHENSIVE METABOLIC PANEL
ALT: 15 U/L (ref 0–44)
AST: 22 U/L (ref 15–41)
Albumin: 3 g/dL — ABNORMAL LOW (ref 3.5–5.0)
Alkaline Phosphatase: 43 U/L (ref 38–126)
Anion gap: 12 (ref 5–15)
BUN: 26 mg/dL — ABNORMAL HIGH (ref 8–23)
CO2: 24 mmol/L (ref 22–32)
Calcium: 8.9 mg/dL (ref 8.9–10.3)
Chloride: 103 mmol/L (ref 98–111)
Creatinine, Ser: 1.34 mg/dL — ABNORMAL HIGH (ref 0.44–1.00)
GFR, Estimated: 38 mL/min — ABNORMAL LOW (ref >60)
Glucose, Bld: 155 mg/dL — ABNORMAL HIGH (ref 70–99)
Potassium: 4.2 mmol/L (ref 3.5–5.1)
Sodium: 139 mmol/L (ref 135–145)
Total Bilirubin: <0.1 mg/dL — ABNORMAL LOW (ref 0.3–1.2)
Total Protein: 5.6 g/dL — ABNORMAL LOW (ref 6.5–8.1)

## 2022-09-23 LAB — CK: Total CK: 207 U/L (ref 38–234)

## 2022-09-23 MED ORDER — SODIUM CHLORIDE 0.9 % IV BOLUS
500.0000 mL | Freq: Once | INTRAVENOUS | Status: AC
  Administered 2022-09-23: 500 mL via INTRAVENOUS

## 2022-09-23 MED ORDER — TRAMADOL HCL 50 MG PO TABS
50.0000 mg | ORAL_TABLET | Freq: Once | ORAL | Status: AC
  Administered 2022-09-23: 50 mg via ORAL
  Filled 2022-09-23: qty 1

## 2022-09-23 MED ORDER — TRAMADOL HCL 50 MG PO TABS: 50.0000 mg | ORAL_TABLET | Freq: Four times a day (QID) | ORAL | 0 refills | Status: DC | PRN

## 2022-09-23 NOTE — Discharge Instructions (Signed)
As we discussed, you likely have hip contusion.  Take Tylenol for pain and tramadol for severe pain  Follow-up with your doctor and please use your walker and your leg brace for several days  Return to ER if you have worsening head pain or another fall or passing out

## 2022-09-23 NOTE — ED Triage Notes (Signed)
Pt fell out of her recliner last Thursday, and fell again today. Denied hitting her head. Pt stated her L leg gave up when she fell today. A&O X4.

## 2022-09-23 NOTE — ED Provider Notes (Signed)
[place] EMERGENCY DEPARTMENT AT [facility] Provider Note   CSN: [id number] Arrival date & time: 09/23/22  1813     History  Chief Complaint  Patient presents with   Nathanial Rancher is a 89 y.o. female history of diabetes, A-fib on Eliquis, here presenting with fall.  Patient states that 3 days ago she had a fall and fell onto her recliner.  She states that she was doing fine and today she states that her left leg gave out and she had another fall.  She states that she hit her left hip.  The history is provided by the patient.       Home Medications Prior to Admission medications   Medication Sig Start Date End Date Taking? Authorizing Provider  acetaminophen (TYLENOL) 500 MG tablet Take 1,000 mg by mouth daily as needed for moderate pain.    [provider]  Ascorbic Acid (VITAMIN C) 500 MG tablet Take 500 mg by mouth daily.    [provider]  atorvastatin (LIPITOR) 10 MG tablet TAKE 1 TABLET ONE TIME DAILY AT 6PM 04/19/22   Pincus Sanes, MD  blood glucose meter kit and supplies KIT Dispense based on patient and insurance preference. Use up to four times daily as directed. (FOR E11.9).Dispense based on patient and insurance preference. Use up to four times daily as directed. (FOR E11.9). Patient would like to stick with Accu-check meter if covered 03/16/22   Pincus Sanes, MD  calcium carbonate (OS-CAL) 600 MG TABS Take 600 mg by mouth daily.    [provider]  Cod Liver Oil 1000 MG CAPS Take 1,000 mg by mouth daily.     [provider]  DORZOLAMIDE HCL-TIMOLOL MAL OP Place 1 drop into the right eye 2 (two) times daily.    [provider]  Dulaglutide (TRULICITY) 3 MG/0.5ML SOPN INJECT 3MG  (1 PEN) UNDER THE SKIN EVERY WEEK 07/02/22   Burns, [name], MD  Dulaglutide 4.5 MG/0.5ML SOPN Inject 4.5 mg into the skin once a week. 09/11/22   Burns, [name], MD  ELIQUIS 5 MG TABS tablet TAKE 1 TABLET TWICE DAILY  (APPOINTMENT IS NEEDED FOR MORE REFILLS) 02/14/22   Pincus Sanes, MD  gabapentin (NEURONTIN) 300 MG capsule TAKE 1 CAPSULE EVERY DAY 02/07/22   Burns, [name], MD  Glucerna (GLUCERNA) LIQD Take 237 mLs by mouth daily. 01/22/20   [provider]  glucose blood (ACCU-CHEK GUIDE) test strip TEST BLOOD SUGAR UP TO FOUR TIMES DAILY 11/20/21   Pincus Sanes, MD  hydrochlorothiazide (HYDRODIURIL) 12.5 MG tablet Take 12.5 mg by mouth daily as needed (swelling).    [provider]  insulin aspart (NOVOLOG FLEXPEN) 100 UNIT/ML FlexPen USE AS DIRECTED BY YOUR PRESCRIBER. COMPLETE DIRECTIONS ARE INCLUDED IN A LETTER WITH YOUR ORIGINAL ORDER 09/11/22   Pincus Sanes, MD  Insulin Pen Needle (BD AUTOSHIELD DUO) 30G X 5 MM MISC USE AS DIRECTED WITH INSULIN PEN NEEDLE 4 TIMES DAILY 04/03/21   Pincus Sanes, MD  Lancet Devices [facility] PLUS LANCING) MISC USE AS DIRECTED 08/02/22   Pincus Sanes, MD  Lancets [facility] DELICA PLUS [place]) MISC USE AS INSTRUCTED 05/25/22   Pincus Sanes, MD  latanoprost (XALATAN) 0.005 % ophthalmic solution Place 1 drop into both eyes at bedtime. 01/11/20   [provider]  lisinopril (ZESTRIL) 40 MG tablet TAKE 1 TABLET EVERY DAY 09/20/22   Pincus Sanes, MD  Multiple Vitamin (MULTIVITAMIN) tablet Take 1 tablet by mouth daily. Prn    [provider]  nitroGLYCERIN (NITROSTAT) 0.4 MG SL tablet DISSOLVE ONE TABLET UNDER TONGUE AS DIRECTED FOR CHEST PAIN 08/05/13   Laurey Morale, MD  omeprazole (PRILOSEC) 20 MG capsule TAKE 1 CAPSULE EVERY DAY (STOP PANTOPRAZOLE) 01/29/22   Burns, [name], MD  RHOPRESSA 0.02 % SOLN Place 1 drop into the right eye at bedtime. 02/05/22   [provider]  Turmeric 500 MG CAPS Take 1 capsule by mouth daily in the afternoon.    [provider]      Allergies    Codeine    Review of Systems   Review of Systems  Musculoskeletal:        Left hip pain  All other systems reviewed and are  negative.   Physical Exam Updated Vital Signs BP (!) 132/56   Pulse 75   Temp 97.8 F (36.6 C) (Oral)   Resp 20   SpO2 100%  Physical Exam Vitals and nursing note reviewed.  Constitutional:      Appearance: Normal appearance.  HENT:     Head: Normocephalic and atraumatic.     Nose: Nose normal.     Mouth/Throat:     Mouth: Mucous membranes are moist.  Eyes:     Extraocular Movements: Extraocular movements intact.     Pupils: Pupils are equal, round, and reactive to light.  Cardiovascular:     Rate and Rhythm: Normal rate and regular rhythm.     Pulses: Normal pulses.     Heart sounds: Normal heart sounds.  Pulmonary:     Effort: Pulmonary effort is normal.     Breath sounds: Normal breath sounds.  Abdominal:     General: Abdomen is flat.     Palpations: Abdomen is soft.  Musculoskeletal:     Cervical back: Normal range of motion and neck supple.     Comments: Mild hip tenderness.  Able to range the hip.  Able to bear weight on the leg  Skin:    General: Skin is warm.     Capillary Refill: Capillary refill takes less than 2 seconds.  Neurological:     General: No focal deficit present.     Mental Status: She is oriented to person, place, and time.  Psychiatric:        Behavior: Behavior normal.    ED Results / Procedures / Treatments   Labs (all labs ordered are listed, but only abnormal results are displayed) Labs Reviewed  CBC WITH DIFFERENTIAL/PLATELET  COMPREHENSIVE METABOLIC PANEL  CK    EKG None  Radiology No results found.  Procedures Procedures    Medications Ordered in ED Medications  sodium chloride 0.9 % bolus 500 mL (500 mLs Intravenous New Bag/Given 09/23/22 1844)    ED Course/ Medical Decision Making/ A&P                             Medical Decision Making FAYRENE SARTI is a 89 y.o. female here presenting with fall with left hip pain.  Plan to get lab work and x-ray of the hip.  8:25 PM I reviewed patient's labs and they  were unremarkable.  I reviewed x-rays and did not show any obvious fracture.  They cannot exclude a subtle subcapital fracture.  However she is able to bear weight on it.  She walks with a walker at baseline.  At this  point I think she is stable for discharge.  Family request some pain medicine I will prescribe tramadol as needed.   Problems Addressed: Contusion of left hip, initial encounter: acute illness or injury Fall, initial encounter: acute illness or injury  Amount and/or Complexity of Data Reviewed Labs: ordered. Decision-making details documented in ED Course. Radiology: ordered and independent interpretation performed. Decision-making details documented in ED Course. ECG/medicine tests: ordered and independent interpretation performed. Decision-making details documented in ED Course.  Risk Prescription drug management.    Final Clinical Impression(s) / ED Diagnoses Final diagnoses:  None    Rx / DC Orders ED Discharge Orders     None         Charlynne Pander, MD 09/23/22 2031

## 2022-09-25 DIAGNOSIS — H401133 Primary open-angle glaucoma, bilateral, severe stage: Secondary | ICD-10-CM | POA: Diagnosis not present

## 2022-09-25 DIAGNOSIS — R6889 Other general symptoms and signs: Secondary | ICD-10-CM | POA: Diagnosis not present

## 2022-09-27 ENCOUNTER — Ambulatory Visit: Payer: Self-pay

## 2022-09-27 ENCOUNTER — Telehealth: Payer: Self-pay

## 2022-09-27 DIAGNOSIS — I509 Heart failure, unspecified: Secondary | ICD-10-CM | POA: Diagnosis not present

## 2022-09-27 DIAGNOSIS — E7800 Pure hypercholesterolemia, unspecified: Secondary | ICD-10-CM | POA: Diagnosis not present

## 2022-09-27 DIAGNOSIS — I130 Hypertensive heart and chronic kidney disease with heart failure and stage 1 through stage 4 chronic kidney disease, or unspecified chronic kidney disease: Secondary | ICD-10-CM | POA: Diagnosis not present

## 2022-09-27 DIAGNOSIS — M545 Low back pain, unspecified: Secondary | ICD-10-CM | POA: Diagnosis not present

## 2022-09-27 DIAGNOSIS — D631 Anemia in chronic kidney disease: Secondary | ICD-10-CM | POA: Diagnosis not present

## 2022-09-27 DIAGNOSIS — M170 Bilateral primary osteoarthritis of knee: Secondary | ICD-10-CM | POA: Diagnosis not present

## 2022-09-27 DIAGNOSIS — I2510 Atherosclerotic heart disease of native coronary artery without angina pectoris: Secondary | ICD-10-CM | POA: Diagnosis not present

## 2022-09-27 DIAGNOSIS — E1122 Type 2 diabetes mellitus with diabetic chronic kidney disease: Secondary | ICD-10-CM | POA: Diagnosis not present

## 2022-09-27 DIAGNOSIS — E1142 Type 2 diabetes mellitus with diabetic polyneuropathy: Secondary | ICD-10-CM | POA: Diagnosis not present

## 2022-09-27 NOTE — Transitions of Care (Post Inpatient/ED Visit) (Addendum)
09/27/2022  Name: Rebecca [name] MRN: [id number] DOB: [birth year]  Today's TOC FU Call Status: Today's TOC FU Call Status:: Successful TOC FU Call Competed TOC FU Call Complete Date: 09/27/22  Red on EMMI-ED Discharge Alert Date & Reason:09/25/22 "Scheduled follow-up appt? No"   Transition Care Management Follow-up Telephone Call Date of Discharge: 09/23/22 Discharge Facility[facility]) Type of Discharge: Emergency Department Reason for ED Visit: Other: ("contusion of left hip,fall") How have you been since you were released from the hospital?: Better (Pt states she is being careful-no more falls-using walker. Hip has some slight bruising. She is only taking the Tramdol 2x/day. Current pain level 3/10-she is getting ready to take morning meds and pain med.) Any questions or concerns?: No  Items Reviewed: Did you receive and understand the discharge instructions provided?: Yes Medications obtained,verified, and reconciled?: Yes (Medications Reviewed) Any new allergies since your discharge?: No Dietary orders reviewed?: Yes Type of Diet Ordered:: low salt/heart healthy/carb modified Do you have support at home?: Yes People in Home: alone Name of Support/Comfort Primary Source: lives [place] but [place] lives nearby and assists her as needed  Medications Reviewed Today: Medications Reviewed Today     Reviewed by Charlyn Minerva, RN (Registered Nurse) on 09/27/22 at 1041  Med List Status: <None>   Medication Order Taking? Sig Documenting Provider Last Dose Status Informant  acetaminophen (TYLENOL) 500 MG tablet [id number] Yes Take 1,000 mg by mouth daily as needed for moderate pain. [provider] Taking Active Nursing Home Medication Administration Guide (MAG)  Ascorbic Acid (VITAMIN C) 500 MG tablet [id number] Yes Take 500 mg by mouth daily. [provider] Taking Active Nursing Home Medication Administration Guide (MAG)  atorvastatin (LIPITOR)  10 MG tablet [id number] Yes TAKE 1 TABLET ONE TIME DAILY AT Jose Persia, MD Taking Active   blood glucose meter kit and supplies KIT [id number]  Dispense based on patient and insurance preference. Use up to four times daily as directed. (FOR E11.9).Dispense based on patient and insurance preference. Use up to four times daily as directed. (FOR E11.9). Patient would like to stick with Accu-check meter if covered Pincus Sanes, MD  Active   calcium carbonate (OS-CAL) 600 MG TABS [id number] Yes Take 600 mg by mouth daily. [provider] Taking Active Nursing Home Medication Administration Guide (MAG)  Cod Liver Oil 1000 MG CAPS [id number] Yes Take 1,000 mg by mouth daily.  [provider] Taking Active Nursing Home Medication Administration Guide (MAG)  DORZOLAMIDE HCL-TIMOLOL MAL OP [id number] Yes Place 1 drop into the right eye 2 (two) times daily. [provider] Taking Active Nursing Home Medication Administration Guide (MAG)           Med Note Joanne Gavel Feb 01, 2020  3:02 PM)    Dulaglutide (TRULICITY) 3 MG/0.5ML Namon Cirri [id number] Yes INJECT 3MG  (1 PEN) UNDER THE SKIN EVERY WEEK Burns, [name], MD Taking Active   Dulaglutide 4.5 MG/0.5ML SOPN [id number] Yes Inject 4.5 mg into the skin once a week. Pincus Sanes, MD Taking Active   ELIQUIS 5 MG TABS tablet [id number] Yes TAKE 1 TABLET TWICE DAILY (APPOINTMENT IS NEEDED FOR MORE REFILLS) Pincus Sanes, MD Taking Active   gabapentin (NEURONTIN) 300 MG capsule [id number] Yes TAKE 1 CAPSULE EVERY DAY Burns, [name], MD Taking Active   Glucerna [facility]) LIQD [id number] Yes Take 237 mLs by mouth daily. [provider] Taking Active Nursing Home Medication Administration Guide (MAG)  glucose blood (ACCU-CHEK GUIDE) test strip [id number] Yes TEST BLOOD SUGAR UP TO FOUR TIMES DAILY Burns, [name], MD Taking Active   hydrochlorothiazide (HYDRODIURIL) 12.5 MG tablet [id number] Yes Take 12.5 mg by mouth daily as needed  (swelling). [provider] Taking Active   insulin aspart (NOVOLOG FLEXPEN) 100 UNIT/ML FlexPen [id number] Yes USE AS DIRECTED BY YOUR PRESCRIBER. COMPLETE DIRECTIONS ARE INCLUDED IN A LETTER WITH YOUR ORIGINAL ORDER Burns, [name], MD Taking Active   Insulin Pen Needle (BD AUTOSHIELD DUO) 30G X 5 MM MISC [id number] Yes USE AS DIRECTED WITH INSULIN PEN NEEDLE 4 TIMES DAILY Pincus Sanes, MD Taking Active   Lancet Devices [facility] DELICA PLUS LANCING) MISC [id number] Yes USE AS DIRECTED Pincus Sanes, MD Taking Active   Lancets [facility] [place] [place] PLUS [place]) MISC [id number] Yes USE AS INSTRUCTED Pincus Sanes, MD Taking Active   latanoprost (XALATAN) 0.005 % ophthalmic solution [id number] Yes Place 1 drop into both eyes at bedtime. [provider] Taking Active Nursing Home Medication Administration Guide (MAG)  lisinopril (ZESTRIL) 40 MG tablet [id number] Yes TAKE 1 TABLET EVERY DAY Burns, [name], MD Taking Active   Multiple Vitamin (MULTIVITAMIN) tablet [id number] Yes Take 1 tablet by mouth daily. Prn [provider] Taking Active Nursing Home Medication Administration Guide (MAG)  nitroGLYCERIN (NITROSTAT) 0.4 MG SL tablet [id number] Yes DISSOLVE ONE TABLET UNDER TONGUE AS DIRECTED FOR CHEST PAIN Laurey Morale, MD Taking Active            Med Note Joanne Gavel Feb 01, 2020  3:11 PM)    omeprazole (PRILOSEC) 20 MG capsule [id number] Yes TAKE 1 CAPSULE EVERY DAY (STOP PANTOPRAZOLE) Pincus Sanes, MD Taking Active   RHOPRESSA 0.02 % SOLN [id number] Yes Place 1 drop into the right eye at bedtime. [provider] Taking Active   traMADol (ULTRAM) 50 MG tablet [id number] Yes Take 1 tablet (50 mg total) by mouth every 6 (six) hours as needed. Charlynne Pander, MD Taking Active   Turmeric 500 MG CAPS [id number] Yes Take 1 capsule by mouth daily in the afternoon. [provider] Taking Active   Med List Note Lenor Derrick, CPhT 02/05/20 1436): [facility]             Home Care and Equipment/Supplies: Were Home Health Services Ordered?: NA Any new equipment or medical supplies ordered?: NA  Functional Questionnaire: Do you need assistance with bathing/showering or dressing?: No Do you need assistance with meal preparation?: No Do you need assistance with eating?: No Do you have difficulty maintaining continence: No Do you need assistance with getting out of bed/getting out of a chair/moving?: No Do you have difficulty managing or taking your medications?: No  Follow up appointments reviewed: PCP Follow-up appointment confirmed?: Yes Date of PCP follow-up appointment?: 10/01/22 Follow-up Provider[facility] Follow-up appointment confirmed?: NA Do you need transportation to your follow-up appointment?: No (pt confirms she no longer drives but family takes her to appts) Do you understand care options if your condition(s) worsen?: Yes-patient verbalized understanding  SDOH Interventions Today    Flowsheet Row Most Recent Value  SDOH Interventions   Food Insecurity Interventions Intervention Not Indicated  Transportation Interventions Intervention Not Indicated      TOC Interventions Today    Flowsheet Row Most Recent Value  TOC Interventions   TOC Interventions Discussed/Reviewed TOC Interventions Discussed      Interventions Today    Flowsheet  Row Most Recent Value  General Interventions   General Interventions Discussed/Reviewed General Interventions Discussed, Doctor Visits  Doctor Visits Discussed/Reviewed Doctor Visits Discussed, PCP  PCP/Specialist Visits Compliance with follow-up visit  Education Interventions   Education Provided Provided Education  Provided Verbal Education On Nutrition, When to see the doctor, Medication, Blood Sugar Monitoring, Other  [pain mgmt. bowel mgmt]  Nutrition Interventions   Nutrition Discussed/Reviewed Nutrition Discussed   Safety Interventions   Safety Discussed/Reviewed Safety Discussed, Fall Risk, Home Safety  Home Safety Assistive Devices        [place], [place] [facility] Care Management Care Management Community Coordinator Direct Phone: [phone] Toll Free: [phone] Fax: [phone]

## 2022-09-27 NOTE — Chronic Care Management (AMB) (Signed)
   09/27/2022  Rebecca [name] Rebecca [name] 1932/09/27 [id number]   Reason for Encounter: Patient is not currently enrolled in the CCM program. CCM status changed to Previously enrolled.   [facility]/Chronic Care Management [phone]

## 2022-10-01 ENCOUNTER — Ambulatory Visit (INDEPENDENT_AMBULATORY_CARE_PROVIDER_SITE_OTHER): Payer: Medicare HMO

## 2022-10-01 ENCOUNTER — Ambulatory Visit (INDEPENDENT_AMBULATORY_CARE_PROVIDER_SITE_OTHER): Payer: Medicare HMO | Admitting: Family Medicine

## 2022-10-01 ENCOUNTER — Encounter: Payer: Self-pay | Admitting: Family Medicine

## 2022-10-01 VITALS — BP 122/62 | HR 84 | Temp 98.1°F | Resp 20 | Ht 63.0 in | Wt 179.0 lb

## 2022-10-01 DIAGNOSIS — R350 Frequency of micturition: Secondary | ICD-10-CM | POA: Diagnosis not present

## 2022-10-01 DIAGNOSIS — R6889 Other general symptoms and signs: Secondary | ICD-10-CM | POA: Diagnosis not present

## 2022-10-01 DIAGNOSIS — D649 Anemia, unspecified: Secondary | ICD-10-CM | POA: Diagnosis not present

## 2022-10-01 DIAGNOSIS — M25562 Pain in left knee: Secondary | ICD-10-CM

## 2022-10-01 DIAGNOSIS — R296 Repeated falls: Secondary | ICD-10-CM | POA: Diagnosis not present

## 2022-10-01 DIAGNOSIS — M25462 Effusion, left knee: Secondary | ICD-10-CM | POA: Diagnosis not present

## 2022-10-01 DIAGNOSIS — M159 Polyosteoarthritis, unspecified: Secondary | ICD-10-CM

## 2022-10-01 DIAGNOSIS — M1712 Unilateral primary osteoarthritis, left knee: Secondary | ICD-10-CM | POA: Diagnosis not present

## 2022-10-01 LAB — CBC WITH DIFFERENTIAL/PLATELET
Basophils Absolute: 0.1 10*3/uL (ref 0.0–0.1)
Basophils Relative: 1.2 % (ref 0.0–3.0)
Eosinophils Absolute: 0.3 10*3/uL (ref 0.0–0.7)
Eosinophils Relative: 5.4 % — ABNORMAL HIGH (ref 0.0–5.0)
HCT: 31.7 % — ABNORMAL LOW (ref 36.0–46.0)
Hemoglobin: 10.2 g/dL — ABNORMAL LOW (ref 12.0–15.0)
Lymphocytes Relative: 29.3 % (ref 12.0–46.0)
Lymphs Abs: 1.4 10*3/uL (ref 0.7–4.0)
MCHC: 32 g/dL (ref 30.0–36.0)
MCV: 81.7 fl (ref 78.0–100.0)
Monocytes Absolute: 0.7 10*3/uL (ref 0.1–1.0)
Monocytes Relative: 14.4 % — ABNORMAL HIGH (ref 3.0–12.0)
Neutro Abs: 2.4 10*3/uL (ref 1.4–7.7)
Neutrophils Relative %: 49.7 % (ref 43.0–77.0)
Platelets: 227 10*3/uL (ref 150.0–400.0)
RBC: 3.88 Mil/uL (ref 3.87–5.11)
RDW: 16.7 % — ABNORMAL HIGH (ref 11.5–15.5)
WBC: 4.9 10*3/uL (ref 4.0–10.5)

## 2022-10-01 LAB — POCT URINALYSIS DIPSTICK
Bilirubin, UA: NEGATIVE
Blood, UA: NEGATIVE
Glucose, UA: NEGATIVE
Ketones, UA: NEGATIVE
Nitrite, UA: NEGATIVE
Protein, UA: POSITIVE — AB
Spec Grav, UA: 1.01 (ref 1.010–1.025)
Urobilinogen, UA: NEGATIVE E.U./dL — AB
pH, UA: 7.5 (ref 5.0–8.0)

## 2022-10-01 LAB — TSH: TSH: 1.46 u[IU]/mL (ref 0.35–5.50)

## 2022-10-01 MED ORDER — TRAMADOL HCL 50 MG PO TABS
50.0000 mg | ORAL_TABLET | Freq: Two times a day (BID) | ORAL | 0 refills | Status: AC | PRN
Start: 2022-10-01 — End: 2022-10-06

## 2022-10-01 MED ORDER — DICLOFENAC SODIUM 1 % EX GEL
2.0000 g | Freq: Four times a day (QID) | CUTANEOUS | 0 refills | Status: AC
Start: 2022-10-01 — End: ?

## 2022-10-01 NOTE — Progress Notes (Signed)
Assessment & Plan:  1. Multiple falls Discussed using a grab bar instead of bending over to pick things up. Advised of the option to call 911 when she is on the ground so they can help her get back up. Concern that her weakness causing her to fall is related to her anemia.  - CBC with Differential/Platelet - TSH  2. Anemia, unspecified type - CBC with Differential/Platelet  3. Acute pain of left knee - traMADol (ULTRAM) 50 MG tablet; Take 1 tablet (50 mg total) by mouth every 12 (twelve) hours as needed for up to 5 days.  Dispense: 10 tablet; Refill: 0 - DG Knee 1-2 Views Left; Future  4. Primary osteoarthritis involving multiple joints - diclofenac Sodium (VOLTAREN) 1 % GEL; Apply 2 g topically 4 (four) times daily.  Dispense: 350 g; Refill: 0  5. Urinary frequency - POCT urinalysis dipstick - Urine Culture   Follow up plan: Return if symptoms worsen or fail to improve.  Rebecca Boston, MSN, APRN, FNP-C  Subjective:  HPI: Rebecca [name] is a 89 y.o. female presenting on 10/01/2022 for Hospitalization Follow-up (ER follow up - [place] 7/14. She had a fall on 7/11, 7/14 (and now 4 more falls on 7/20). [name] is weak on the Left hip area, left knee is also sore [name] is also concerned of her HGB being low (8.9 on 7/14))  Patient is accompanied by her great granddaughter, who she is okay with being present.  Patient was seen in the ER on 09/23/2022 after a fall on 7/11 and 7/14.  Her left hip x-ray could not exclude a subtle left-sided subcapital fracture.  Since she was able to bear weight she was discharged home with a prescription for tramadol.  Her hemoglobin was low at that time; it had dropped from 10.6 on 07/27/2022 down to 8.9 on 09/23/2022.  She has had 4 more falls since leaving the ER.  She reports today she is falling when she is bending over to pick things up and she loses her balance. She does use a walker or Rolator at all times. She has just finished working with  physical therapy. She denies dizziness and shortness of breath. She does report weakness and loss of strength. She has a life alert, which she has not been wearing. Denies and visible blood in urine, stool, etc.  Her hip pain is improving, but now her left knee is in pain from the subsequent falls. She rates the pain 8/10 currently. Her last dose of Tramadol was last night. She has not been taking anything else for pain, but has been applying rubbing alcohol.   She would like her urine checked as she reports she thought she had an infection 3 weeks ago when she was experiencing urgency and frequency. She took AZO at that time to resolve her symptoms.    ROS: Negative unless specifically indicated above in HPI.   Relevant past medical history reviewed and updated as indicated.   Allergies and medications reviewed and updated.   Current Outpatient Medications:    acetaminophen (TYLENOL) 500 MG tablet, Take 1,000 mg by mouth daily as needed for moderate pain., Disp: , Rfl:    Ascorbic Acid (VITAMIN C) 500 MG tablet, Take 500 mg by mouth daily., Disp: , Rfl:    atorvastatin (LIPITOR) 10 MG tablet, TAKE 1 TABLET ONE TIME DAILY AT 6PM, Disp: 90 tablet, Rfl: 3   blood glucose meter kit and supplies KIT, Dispense based on patient and insurance  preference. Use up to four times daily as directed. (FOR E11.9).Dispense based on patient and insurance preference. Use up to four times daily as directed. (FOR E11.9). Patient would like to stick with Accu-check meter if covered, Disp: 1 each, Rfl: 0   calcium carbonate (OS-CAL) 600 MG TABS, Take 600 mg by mouth daily., Disp: , Rfl:    Cod Liver Oil 1000 MG CAPS, Take 1,000 mg by mouth daily. , Disp: , Rfl:    DORZOLAMIDE HCL-TIMOLOL MAL OP, Place 1 drop into the right eye 2 (two) times daily., Disp: , Rfl:    Dulaglutide (TRULICITY) 3 MG/0.5ML SOPN, INJECT 3MG  (1 PEN) UNDER THE SKIN EVERY WEEK, Disp: 2 mL, Rfl: 3   Dulaglutide 4.5 MG/0.5ML SOPN, Inject 4.5 mg  into the skin once a week., Disp: 2 mL, Rfl: 0   ELIQUIS 5 MG TABS tablet, TAKE 1 TABLET TWICE DAILY (APPOINTMENT IS NEEDED FOR MORE REFILLS), Disp: 180 tablet, Rfl: 3   gabapentin (NEURONTIN) 300 MG capsule, TAKE 1 CAPSULE EVERY DAY, Disp: 90 capsule, Rfl: 3   Glucerna (GLUCERNA) LIQD, Take 237 mLs by mouth daily., Disp: , Rfl:    glucose blood (ACCU-CHEK GUIDE) test strip, TEST BLOOD SUGAR UP TO FOUR TIMES DAILY, Disp: 400 strip, Rfl: 2   hydrochlorothiazide (HYDRODIURIL) 12.5 MG tablet, Take 12.5 mg by mouth daily as needed (swelling)., Disp: , Rfl:    insulin aspart (NOVOLOG FLEXPEN) 100 UNIT/ML FlexPen, USE AS DIRECTED BY YOUR PRESCRIBER. COMPLETE DIRECTIONS ARE INCLUDED IN A LETTER WITH YOUR ORIGINAL ORDER, Disp: 45 mL, Rfl: 3   Insulin Pen Needle (BD AUTOSHIELD DUO) 30G X 5 MM MISC, USE AS DIRECTED WITH INSULIN PEN NEEDLE 4 TIMES DAILY, Disp: 400 each, Rfl: 2   Lancet Devices (ONETOUCH DELICA PLUS LANCING) MISC, USE AS DIRECTED, Disp: 1 each, Rfl: 3   Lancets (ONETOUCH DELICA PLUS LANCET33G) MISC, USE AS INSTRUCTED, Disp: 400 each, Rfl: 3   latanoprost (XALATAN) 0.005 % ophthalmic solution, Place 1 drop into both eyes at bedtime., Disp: , Rfl:    lisinopril (ZESTRIL) 40 MG tablet, TAKE 1 TABLET EVERY DAY, Disp: 90 tablet, Rfl: 2   Multiple Vitamin (MULTIVITAMIN) tablet, Take 1 tablet by mouth daily. Prn, Disp: , Rfl:    nitroGLYCERIN (NITROSTAT) 0.4 MG SL tablet, DISSOLVE ONE TABLET UNDER TONGUE AS DIRECTED FOR CHEST PAIN, Disp: 25 tablet, Rfl: 5   omeprazole (PRILOSEC) 20 MG capsule, TAKE 1 CAPSULE EVERY DAY (STOP PANTOPRAZOLE), Disp: 90 capsule, Rfl: 10   RHOPRESSA 0.02 % SOLN, Place 1 drop into the right eye at bedtime., Disp: , Rfl:    traMADol (ULTRAM) 50 MG tablet, Take 1 tablet (50 mg total) by mouth every 6 (six) hours as needed., Disp: 10 tablet, Rfl: 0   Turmeric 500 MG CAPS, Take 1 capsule by mouth daily in the afternoon., Disp: , Rfl:   Allergies  Allergen Reactions   Codeine      "Makes me drunk"    Objective:   BP 122/62   Pulse 84   Temp 98.1 F (36.7 C)   Resp 20   Ht 5\' 3"  (1.6 m)   Wt 179 lb (81.2 kg)   BMI 31.71 kg/m    Physical Exam Vitals reviewed.  Constitutional:      General: She is not in acute distress.    Appearance: Normal appearance. She is not ill-appearing, toxic-appearing or diaphoretic.  HENT:     Head: Normocephalic and atraumatic.  Eyes:     General: No  scleral icterus.       Right eye: No discharge.        Left eye: No discharge.     Conjunctiva/sclera: Conjunctivae normal.  Cardiovascular:     Rate and Rhythm: Normal rate and regular rhythm.     Heart sounds: Normal heart sounds. No murmur heard.    No friction rub. No gallop.  Pulmonary:     Effort: Pulmonary effort is normal. No respiratory distress.     Breath sounds: Normal breath sounds. No stridor. No wheezing, rhonchi or rales.  Musculoskeletal:        General: Normal range of motion.     Cervical back: Normal range of motion.     Left knee: No swelling, effusion, erythema, ecchymosis, lacerations or bony tenderness. Normal range of motion. Tenderness present over the medial joint line and patellar tendon. No LCL laxity, MCL laxity, ACL laxity or PCL laxity.Normal alignment, normal meniscus and normal patellar mobility.     Instability Tests: Anterior drawer test negative. Posterior drawer test negative. Anterior Lachman test negative. Medial McMurray test negative and lateral McMurray test negative.     Left ankle: Deformity (wearing a brace) present.  Skin:    General: Skin is warm and dry.     Capillary Refill: Capillary refill takes less than 2 seconds.  Neurological:     General: No focal deficit present.     Mental Status: She is alert and oriented to person, place, and time. Mental status is at baseline.  Psychiatric:        Mood and Affect: Mood normal.        Behavior: Behavior normal.        Thought Content: Thought content normal.        Judgment:  Judgment normal.

## 2022-10-02 LAB — URINE CULTURE

## 2022-10-10 ENCOUNTER — Other Ambulatory Visit: Payer: Self-pay | Admitting: Internal Medicine

## 2022-10-10 DIAGNOSIS — Z794 Long term (current) use of insulin: Secondary | ICD-10-CM

## 2022-10-24 ENCOUNTER — Telehealth: Payer: Self-pay | Admitting: Internal Medicine

## 2022-10-24 NOTE — Telephone Encounter (Signed)
Pt called stating that she has a over acting bladder. Pt was asking was Drifenacin was good for her to take. Please advise.

## 2022-10-25 NOTE — Telephone Encounter (Signed)
It may or may not be.  She should be evaluated first prior to starting any medications to make sure there is no infection, etc.

## 2022-10-25 NOTE — Telephone Encounter (Signed)
Appointment made for next Wednesday.

## 2022-10-30 DIAGNOSIS — R6889 Other general symptoms and signs: Secondary | ICD-10-CM | POA: Diagnosis not present

## 2022-10-30 NOTE — Progress Notes (Unsigned)
Subjective:    Patient ID: Rebecca [name], female    DOB: 1932-07-13, 89 y.o.   MRN: [id number]      HPI Rebecca [name] is here for No chief complaint on file.   Overactive bladder -      Medications and allergies reviewed with patient and updated if appropriate.  Current Outpatient Medications on File Prior to Visit  Medication Sig Dispense Refill   acetaminophen (TYLENOL) 500 MG tablet Take 1,000 mg by mouth daily as needed for moderate pain.     Ascorbic Acid (VITAMIN C) 500 MG tablet Take 500 mg by mouth daily.     atorvastatin (LIPITOR) 10 MG tablet TAKE 1 TABLET ONE TIME DAILY AT 6PM 90 tablet 3   blood glucose meter kit and supplies KIT Dispense based on patient and insurance preference. Use up to four times daily as directed. (FOR E11.9).Dispense based on patient and insurance preference. Use up to four times daily as directed. (FOR E11.9). Patient would like to stick with Accu-check meter if covered 1 each 0   calcium carbonate (OS-CAL) 600 MG TABS Take 600 mg by mouth daily.     Cod Liver Oil 1000 MG CAPS Take 1,000 mg by mouth daily.      diclofenac Sodium (VOLTAREN) 1 % GEL Apply 2 g topically 4 (four) times daily. 350 g 0   DORZOLAMIDE HCL-TIMOLOL MAL OP Place 1 drop into the right eye 2 (two) times daily.     Dulaglutide (TRULICITY) 3 MG/0.5ML SOPN INJECT 3MG  (1 PEN) UNDER THE SKIN EVERY WEEK 2 mL 3   Dulaglutide (TRULICITY) 4.5 MG/0.5ML SOPN INJECT 4.5MG  (1 PEN) UNDER THE SKIN EVERY WEEK 2 mL 11   ELIQUIS 5 MG TABS tablet TAKE 1 TABLET TWICE DAILY (APPOINTMENT IS NEEDED FOR MORE REFILLS) 180 tablet 3   gabapentin (NEURONTIN) 300 MG capsule TAKE 1 CAPSULE EVERY DAY 90 capsule 3   Glucerna (GLUCERNA) LIQD Take 237 mLs by mouth daily.     glucose blood (ACCU-CHEK GUIDE) test strip TEST BLOOD SUGAR UP TO FOUR TIMES DAILY 400 strip 2   hydrochlorothiazide (HYDRODIURIL) 12.5 MG tablet Take 12.5 mg by mouth daily as needed (swelling).     insulin aspart (NOVOLOG  FLEXPEN) 100 UNIT/ML FlexPen USE AS DIRECTED BY YOUR PRESCRIBER. COMPLETE DIRECTIONS ARE INCLUDED IN A LETTER WITH YOUR ORIGINAL ORDER 45 mL 3   Insulin Pen Needle (BD AUTOSHIELD DUO) 30G X 5 MM MISC USE AS DIRECTED WITH INSULIN PEN NEEDLE 4 TIMES DAILY 400 each 2   Lancet Devices (ONETOUCH DELICA PLUS LANCING) MISC USE AS DIRECTED 1 each 3   Lancets (ONETOUCH DELICA PLUS LANCET33G) MISC USE AS INSTRUCTED 400 each 3   latanoprost (XALATAN) 0.005 % ophthalmic solution Place 1 drop into both eyes at bedtime.     lisinopril (ZESTRIL) 40 MG tablet TAKE 1 TABLET EVERY DAY 90 tablet 2   Multiple Vitamin (MULTIVITAMIN) tablet Take 1 tablet by mouth daily. Prn     nitroGLYCERIN (NITROSTAT) 0.4 MG SL tablet DISSOLVE ONE TABLET UNDER TONGUE AS DIRECTED FOR CHEST PAIN 25 tablet 5   omeprazole (PRILOSEC) 20 MG capsule TAKE 1 CAPSULE EVERY DAY (STOP PANTOPRAZOLE) 90 capsule 10   RHOPRESSA 0.02 % SOLN Place 1 drop into the right eye at bedtime.     Turmeric 500 MG CAPS Take 1 capsule by mouth daily in the afternoon.     No current facility-administered medications on file prior to visit.    Review of Systems  Objective:  There were no vitals filed for this visit. BP Readings from Last 3 Encounters:  10/01/22 122/62  09/23/22 (!) 143/63  07/27/22 (!) 140/68   Wt Readings from Last 3 Encounters:  10/01/22 179 lb (81.2 kg)  07/27/22 179 lb 8 oz (81.4 kg)  07/24/22 174 lb 6.4 oz (79.1 kg)   There is no height or weight on file to calculate BMI.    Physical Exam         Assessment & Plan:    See Problem List for Assessment and Plan of chronic medical problems.

## 2022-10-31 ENCOUNTER — Encounter: Payer: Self-pay | Admitting: Internal Medicine

## 2022-10-31 ENCOUNTER — Ambulatory Visit (INDEPENDENT_AMBULATORY_CARE_PROVIDER_SITE_OTHER): Payer: Medicare HMO | Admitting: Internal Medicine

## 2022-10-31 VITALS — BP 112/64 | HR 74 | Temp 98.2°F | Ht 63.0 in | Wt 178.0 lb

## 2022-10-31 DIAGNOSIS — N3000 Acute cystitis without hematuria: Secondary | ICD-10-CM | POA: Diagnosis not present

## 2022-10-31 DIAGNOSIS — R6889 Other general symptoms and signs: Secondary | ICD-10-CM | POA: Diagnosis not present

## 2022-10-31 DIAGNOSIS — I10 Essential (primary) hypertension: Secondary | ICD-10-CM | POA: Diagnosis not present

## 2022-10-31 DIAGNOSIS — R350 Frequency of micturition: Secondary | ICD-10-CM

## 2022-10-31 LAB — POC URINALSYSI DIPSTICK (AUTOMATED)
Bilirubin, UA: NEGATIVE
Blood, UA: NEGATIVE
Glucose, UA: NEGATIVE
Ketones, UA: NEGATIVE
Nitrite, UA: NEGATIVE
Protein, UA: NEGATIVE
Spec Grav, UA: 1.015 (ref 1.010–1.025)
Urobilinogen, UA: 0.2 E.U./dL
pH, UA: 6 (ref 5.0–8.0)

## 2022-10-31 MED ORDER — CEPHALEXIN 500 MG PO CAPS: 500.0000 mg | ORAL_CAPSULE | Freq: Two times a day (BID) | ORAL | 0 refills | Status: DC

## 2022-10-31 NOTE — Assessment & Plan Note (Signed)
Acute Urine dip consistent with UTI Will send urine for culture Take the antibiotic as prescribed.  Keflex bid x 7 days Take tylenol if needed.   Increase your water intake.  Call if no improvement

## 2022-10-31 NOTE — Assessment & Plan Note (Signed)
Chronic Blood pressure well-controlled Denies lightheadedness Monitor BP at home Continue hydrochlorothiazide 12.5 mg daily as needed, lisinopril 40 mg daily

## 2022-10-31 NOTE — Patient Instructions (Signed)
Your urine suggests an infection.   Take the antibiotic as prescribed.  Take tylenol if needed.     Increase your water intake.   Call if no improvement     Urinary Tract Infection, Adult A urinary tract infection (UTI) is an infection of any part of the urinary tract, which includes the kidneys, ureters, bladder, and urethra. These organs make, store, and get rid of urine in the body. UTI can be a bladder infection (cystitis) or kidney infection (pyelonephritis). What are the causes? This infection may be caused by fungi, viruses, or bacteria. Bacteria are the most common cause of UTIs. This condition can also be caused by repeated incomplete emptying of the bladder during urination. What increases the risk? This condition is more likely to develop if: You ignore your need to urinate or hold urine for long periods of time. You do not empty your bladder completely during urination. You wipe back to front after urinating or having a bowel movement, if you are female. You are uncircumcised, if you are female. You are constipated. You have a urinary catheter that stays in place (indwelling). You have a weak defense (immune) system. You have a medical condition that affects your bowels, kidneys, or bladder. You have diabetes. You take antibiotic medicines frequently or for long periods of time, and the antibiotics no longer work well against certain types of infections (antibiotic resistance). You take medicines that irritate your urinary tract. You are exposed to chemicals that irritate your urinary tract. You are female.  What are the signs or symptoms? Symptoms of this condition include: Fever. Frequent urination or passing small amounts of urine frequently. Needing to urinate urgently. Pain or burning with urination. Urine that smells bad or unusual. Cloudy urine. Pain in the lower abdomen or back. Trouble urinating. Blood in the urine. Vomiting or being less hungry than  normal. Diarrhea or abdominal pain. Vaginal discharge, if you are female.  How is this diagnosed? This condition is diagnosed with a medical history and physical exam. You will also need to provide a urine sample to test your urine. Other tests may be done, including: Blood tests. Sexually transmitted disease (STD) testing.  If you have had more than one UTI, a cystoscopy or imaging studies may be done to determine the cause of the infections. How is this treated? Treatment for this condition often includes a combination of two or more of the following: Antibiotic medicine. Other medicines to treat less common causes of UTI. Over-the-counter medicines to treat pain. Drinking enough water to stay hydrated.  Follow these instructions at home: Take over-the-counter and prescription medicines only as told by your health care provider. If you were prescribed an antibiotic, take it as told by your health care provider. Do not stop taking the antibiotic even if you start to feel better. Avoid alcohol, caffeine, tea, and carbonated beverages. They can irritate your bladder. Drink enough fluid to keep your urine clear or pale yellow. Keep all follow-up visits as told by your health care provider. This is important. Make sure to: Empty your bladder often and completely. Do not hold urine for long periods of time. Empty your bladder before and after sex. Wipe from front to back after a bowel movement if you are female. Use each tissue one time when you wipe. Contact a health care provider if: You have back pain. You have a fever. You feel nauseous or vomit. Your symptoms do not get better after 3 days. Your symptoms go  away and then return. Get help right away if: You have severe back pain or lower abdominal pain. You are vomiting and cannot keep down any medicines or water. This information is not intended to replace advice given to you by your health care provider. Make sure you discuss  any questions you have with your health care provider. Document Released: 12/06/2004 Document Revised: 08/10/2015 Document Reviewed: 01/17/2015 Elsevier Interactive Patient Education  [employer].

## 2022-10-31 NOTE — Addendum Note (Signed)
Addended by: Karma Ganja on: 10/31/2022 11:36 AM   Modules accepted: Orders

## 2022-11-04 LAB — URINE CULTURE

## 2022-11-04 MED ORDER — NITROFURANTOIN MONOHYD MACRO 100 MG PO CAPS: 100.0000 mg | ORAL_CAPSULE | Freq: Two times a day (BID) | ORAL | 0 refills | Status: DC

## 2022-11-04 NOTE — Addendum Note (Signed)
Addended by: Pincus Sanes on: 11/04/2022 12:08 PM   Modules accepted: Orders

## 2022-11-21 ENCOUNTER — Ambulatory Visit (INDEPENDENT_AMBULATORY_CARE_PROVIDER_SITE_OTHER): Payer: Medicare HMO | Admitting: Podiatry

## 2022-11-21 DIAGNOSIS — B351 Tinea unguium: Secondary | ICD-10-CM

## 2022-11-21 DIAGNOSIS — M79675 Pain in left toe(s): Secondary | ICD-10-CM | POA: Diagnosis not present

## 2022-11-21 DIAGNOSIS — M79674 Pain in right toe(s): Secondary | ICD-10-CM | POA: Diagnosis not present

## 2022-11-21 DIAGNOSIS — E1142 Type 2 diabetes mellitus with diabetic polyneuropathy: Secondary | ICD-10-CM

## 2022-11-22 DIAGNOSIS — R6889 Other general symptoms and signs: Secondary | ICD-10-CM | POA: Diagnosis not present

## 2022-11-25 ENCOUNTER — Encounter: Payer: Self-pay | Admitting: Podiatry

## 2022-11-25 NOTE — Progress Notes (Signed)
Subjective:  Patient ID: Rebecca [name], female    DOB: [birth year],  MRN: [id number]  Rebecca [name] presents to clinic today for at risk foot care with history of diabetic neuropathy and painful thick toenails that are difficult to trim. Pain interferes with ambulation. Aggravating factors include wearing enclosed shoe gear. Pain is relieved with periodic professional debridement.  Chief Complaint  Patient presents with   Diabetes    [place] BS - 104  A1C - 7 LVPCP -10/2022   New problem(s): None.   PCP is Pincus Sanes, MD.  Allergies  Allergen Reactions   Codeine     "Makes me drunk"    Review of Systems: Negative except as noted in the HPI.  Objective: No changes noted in today's physical examination. There were no vitals filed for this visit. Rebecca [name] is a pleasant 89 y.o. female WD, WN in NAD. AAO x 3.  Vascular Examination: CFT <4 seconds b/l LE. Faintly palpable DP pulses b/l LE. Faintly palpable PT pulse(s) b/l LE. Pedal hair absent. No pain with calf compression b/l. +2 pitting edema BLE. No ischemia or gangrene noted b/l LE. No cyanosis or clubbing noted b/l LE.  Dermatological Examination: Pedal skin is warm and supple b/l LE. No open wounds b/l LE. No interdigital macerations noted b/l LE. Toenails 1-5 left, R hallux, R 3rd toe, R 4th toe, and R 5th toe elongated, discolored, dystrophic, thickened, and crumbly with subungual debris and tenderness to dorsal palpation.   Anonychia noted right 2nd toe, right 4th toe. Nailbeds remain intact.  Incurvated nailplate b/l lower extremities with tenderness to palpation. No erythema, no edema, no drainage noted. No fluctuance.   Musculoskeletal Examination: Muscle strength 5/5 to all lower extremity muscle groups bilaterally. Severely collapsed flatfoot of the left lower extremity. Wearing appropriate fitting shoe gear. Utilizes rollator for ambulation assistance.  Neurological Examination: Pt has  subjective symptoms of neuropathy. Protective sensation intact 5/5 intact bilaterally with 10g monofilament b/l. Vibratory sensation diminished b/l.  Assessment/Plan: 1. Pain due to onychomycosis of toenails of both feet   2. Diabetic peripheral neuropathy associated with type 2 diabetes mellitus (HCC)     -Patient was evaluated and treated. All patient's and/or POA's questions/concerns answered on today's visit. -Continue supportive shoe gear daily. -Mycotic toenails 1-5 left foot, right great toe, right third digit, right fourth digit, and right fifth digit were debrided in length and girth with sterile nail nippers and dremel without iatrogenic bleeding. -Patient/POA to call should there be question/concern in the interim.   Return in about 3 months (around 02/20/2023).  Freddie Breech, DPM

## 2022-11-27 ENCOUNTER — Telehealth: Payer: Self-pay | Admitting: Internal Medicine

## 2022-11-27 ENCOUNTER — Other Ambulatory Visit: Payer: Self-pay

## 2022-11-27 DIAGNOSIS — R300 Dysuria: Secondary | ICD-10-CM

## 2022-11-27 NOTE — Telephone Encounter (Signed)
Spoke with patient today.  She will arrange transportation and drop of urine.  Lab order placed

## 2022-11-27 NOTE — Telephone Encounter (Signed)
Can she bring in another urine sample to check for infection ?   Also I can refer her to a specialist if she wants

## 2022-11-27 NOTE — Telephone Encounter (Signed)
Pt called stating she have took the medication for her bladder but she is still having excessive urination to the point she is unable to hold her urine. Please advise. Pt stated this is urgent

## 2022-11-29 ENCOUNTER — Other Ambulatory Visit (INDEPENDENT_AMBULATORY_CARE_PROVIDER_SITE_OTHER): Payer: Medicare HMO

## 2022-11-29 DIAGNOSIS — R300 Dysuria: Secondary | ICD-10-CM

## 2022-11-30 LAB — URINALYSIS, ROUTINE W REFLEX MICROSCOPIC
Bilirubin Urine: NEGATIVE
Hgb urine dipstick: NEGATIVE
Ketones, ur: NEGATIVE
Nitrite: NEGATIVE
Specific Gravity, Urine: 1.015 (ref 1.000–1.030)
Total Protein, Urine: NEGATIVE
Urine Glucose: NEGATIVE
Urobilinogen, UA: 0.2 (ref 0.0–1.0)
pH: 6 (ref 5.0–8.0)

## 2022-12-02 LAB — CULTURE, URINE COMPREHENSIVE

## 2022-12-02 MED ORDER — CEPHALEXIN 500 MG PO CAPS: 500.0000 mg | ORAL_CAPSULE | Freq: Two times a day (BID) | ORAL | 0 refills | Status: AC

## 2022-12-03 ENCOUNTER — Other Ambulatory Visit: Payer: Self-pay | Admitting: Internal Medicine

## 2022-12-05 ENCOUNTER — Ambulatory Visit: Payer: Medicare HMO

## 2022-12-05 DIAGNOSIS — Z0000 Encounter for general adult medical examination without abnormal findings: Secondary | ICD-10-CM

## 2022-12-05 NOTE — Progress Notes (Signed)
Subjective:   Rebecca [name] is a 89 y.o. female who presents for Medicare Annual (Subsequent) preventive examination.  Visit Complete: Virtual  I connected with  [name] on 12/05/22 by a audio enabled telemedicine application and verified that I am speaking with the correct person using two identifiers.  Patient Location: Home  Provider Location: Home Office  I discussed the limitations of evaluation and management by telemedicine. The patient expressed understanding and agreed to proceed.  Patient Medicare AWV questionnaire was completed by the patient on 11-04-2022; I have confirmed that all information answered by patient is correct and no changes since this date.  Because this visit was a virtual/telehealth visit, some criteria may be missing or patient reported. Any vitals not documented were not able to be obtained and vitals that have been documented are patient reported.   Cardiac Risk Factors include: advanced age (>7men, >36 women);diabetes mellitus;hypertension     Objective:    There were no vitals filed for this visit. There is no height or weight on file to calculate BMI.     12/05/2022    3:11 PM 09/23/2022    6:27 PM 02/08/2022   10:15 PM 11/24/2021    1:16 PM 11/17/2020    6:14 PM 02/06/2020    9:00 PM 02/01/2020    2:59 PM  Advanced Directives  Does Patient Have a Medical Advance Directive? Yes No Yes Yes Yes Yes Yes  Type of Health and safety inspector Power of Attorney  Living will Living will   Does patient want to make changes to medical advance directive?     Yes (ED - Information included in AVS) No - Patient declined No - Patient declined  Copy of Healthcare Power of Attorney in Chart? No - copy requested          Current Medications (verified) Outpatient Encounter Medications as of 12/05/2022  Medication Sig   acetaminophen (TYLENOL) 500 MG tablet Take 1,000 mg by mouth daily as needed for moderate  pain.   Ascorbic Acid (VITAMIN C) 500 MG tablet Take 500 mg by mouth daily.   atorvastatin (LIPITOR) 10 MG tablet TAKE 1 TABLET ONE TIME DAILY AT 6PM   blood glucose meter kit and supplies KIT Dispense based on patient and insurance preference. Use up to four times daily as directed. (FOR E11.9).Dispense based on patient and insurance preference. Use up to four times daily as directed. (FOR E11.9). Patient would like to stick with Accu-check meter if covered   calcium carbonate (OS-CAL) 600 MG TABS Take 600 mg by mouth daily.   cephALEXin (KEFLEX) 500 MG capsule Take 1 capsule (500 mg total) by mouth 2 (two) times daily for 7 days.   Cod Liver Oil 1000 MG CAPS Take 1,000 mg by mouth daily.    diclofenac Sodium (VOLTAREN) 1 % GEL Apply 2 g topically 4 (four) times daily.   DORZOLAMIDE HCL-TIMOLOL MAL OP Place 1 drop into the right eye 2 (two) times daily.   Dulaglutide (TRULICITY) 4.5 MG/0.5ML SOPN INJECT 4.5MG  (1 PEN) UNDER THE SKIN EVERY WEEK   ELIQUIS 5 MG TABS tablet TAKE 1 TABLET TWICE DAILY (APPOINTMENT IS NEEDED FOR MORE REFILLS)   gabapentin (NEURONTIN) 300 MG capsule TAKE 1 CAPSULE EVERY DAY   Glucerna (GLUCERNA) LIQD Take 237 mLs by mouth daily.   glucose blood (ACCU-CHEK GUIDE) test strip TEST BLOOD SUGAR UP TO FOUR TIMES DAILY   hydrochlorothiazide (HYDRODIURIL) 12.5 MG tablet Take 12.5 mg by  mouth daily as needed (swelling).   insulin aspart (NOVOLOG FLEXPEN) 100 UNIT/ML FlexPen USE AS DIRECTED BY YOUR PRESCRIBER. COMPLETE DIRECTIONS ARE INCLUDED IN A LETTER WITH YOUR ORIGINAL ORDER   Insulin Pen Needle (BD AUTOSHIELD DUO) 30G X 5 MM MISC USE AS DIRECTED WITH INSULIN PEN NEEDLE 4 TIMES DAILY   Lancet Devices (ONETOUCH DELICA PLUS LANCING) MISC USE AS DIRECTED   Lancets (ONETOUCH DELICA PLUS LANCET33G) MISC USE AS INSTRUCTED   latanoprost (XALATAN) 0.005 % ophthalmic solution Place 1 drop into both eyes at bedtime.   lisinopril (ZESTRIL) 40 MG tablet TAKE 1 TABLET EVERY DAY   Multiple  Vitamin (MULTIVITAMIN) tablet Take 1 tablet by mouth daily. Prn   nitrofurantoin, macrocrystal-monohydrate, (MACROBID) 100 MG capsule Take 1 capsule (100 mg total) by mouth 2 (two) times daily.   nitroGLYCERIN (NITROSTAT) 0.4 MG SL tablet DISSOLVE ONE TABLET UNDER TONGUE AS DIRECTED FOR CHEST PAIN   omeprazole (PRILOSEC) 20 MG capsule TAKE 1 CAPSULE EVERY DAY (STOP PANTOPRAZOLE)   RHOPRESSA 0.02 % SOLN Place 1 drop into the right eye at bedtime.   Turmeric 500 MG CAPS Take 1 capsule by mouth daily in the afternoon.   No facility-administered encounter medications on file as of 12/05/2022.    Allergies (verified) Codeine   History: Past Medical History:  Diagnosis Date   Anemia, unspecified    Ankle pain, left    Arthritis    CAD (coronary artery disease)    Esophageal reflux    Fibrocystic breast disease    Hiatal hernia    Hypercholesteremia    Hypertension    IDDM (insulin dependent diabetes mellitus)    Type II   Multinodular goiter    Sickle-cell trait (HCC)    Past Surgical History:  Procedure Laterality Date   "FEMALE"  1976   BACK SURGERY  1990   BIOPSY THYROID     Dr.Ellison-Neg    CORONARY ANGIOPLASTY     FOOT SURGERY  2004   RETINAL LASER PROCEDURE     Family History  Problem Relation Age of Onset   Hypertension Other    Lung cancer Other    Heart disease Other    Gout Sister    Gout Brother    Thyroid disease Brother    Heart attack Father    Heart attack Mother    Diabetes Mother    Gout Brother    Social History   Socioeconomic History   Marital status: Widowed    Spouse name: Not on file   Number of children: 1   Years of education: Not on file   Highest education level: 12th grade  Occupational History   Occupation: retired  Tobacco Use   Smoking status: Never   Smokeless tobacco: Never  Vaping Use   Vaping status: Never Used  Substance and Sexual Activity   Alcohol use: No   Drug use: No   Sexual activity: Not Currently  Other  Topics Concern   Not on file  Social History Narrative   Widowed   Gets regular exercise   Supportive church network      Social Determinants of Health   Financial Resource Strain: Low Risk  (12/05/2022)   Overall Financial Resource Strain (CARDIA)    Difficulty of Paying Living Expenses: Not hard at all  Food Insecurity: No Food Insecurity (12/05/2022)   Hunger Vital Sign    Worried About Running Out of Food in the Last Year: Never true    Ran Out of Food  in the Last Year: Never true  Transportation Needs: No Transportation Needs (12/05/2022)   PRAPARE - Administrator, Civil Service (Medical): No    Lack of Transportation (Non-Medical): No  Physical Activity: Inactive (12/05/2022)   Exercise Vital Sign    Days of Exercise per Week: 0 days    Minutes of Exercise per Session: 0 min  Stress: No Stress Concern Present (12/05/2022)   [employer] of Occupational Health - Occupational Stress Questionnaire    Feeling of Stress : Not at all  Social Connections: Moderately Integrated (12/05/2022)   Social Connection and Isolation Panel [NHANES]    Frequency of Communication with Friends and Family: More than three times a week    Frequency of Social Gatherings with Friends and Family: Once a week    Attends Religious Services: More than 4 times per year    Active Member of [employer] or Organizations: Yes    Attends Banker Meetings: More than 4 times per year    Marital Status: Widowed    Tobacco Counseling Counseling given: Not Answered   Clinical Intake:  Pre-visit preparation completed: Yes  Pain : No/denies pain     Diabetes: Yes CBG done?: No Did pt. bring in CBG monitor from home?: No  How often do you need to have someone help you when you read instructions, pamphlets, or other written materials from your doctor or pharmacy?: 1 - Never  Interpreter Needed?: No  Information entered by :: Remi Haggard LPN   Activities of Daily Living     12/05/2022    3:12 PM  In your present state of health, do you have any difficulty performing the following activities:  Hearing? 0  Vision? 0  Difficulty concentrating or making decisions? 0  Walking or climbing stairs? 1  Dressing or bathing? 0  Doing errands, shopping? 1  Preparing Food and eating ? N  Using the Toilet? N  In the past six months, have you accidently leaked urine? Y  Do you have problems with loss of bowel control? N  Managing your Medications? N  Managing your Finances? N  Housekeeping or managing your Housekeeping? N    Patient Care Team: Pincus Sanes, MD as PCP - General (Internal Medicine) Maisie Fus, MD as PCP - Cardiology (Cardiology)  Indicate any recent Medical Services you may have received from other than [facility] providers in the past year (date may be approximate).     Assessment:   This is a routine wellness examination for Noelly.  Hearing/Vision screen Hearing Screening - Comments:: No trouble hearing Vision Screening - Comments:: Up to date  Claudia Desanctis   Goals Addressed             This Visit's Progress    Patient Stated       Would like to walk without the walker       Depression Screen    12/05/2022    3:14 PM 10/31/2022   10:31 AM 10/01/2022   11:06 AM 05/10/2022    3:24 PM 02/09/2022    4:07 PM 01/26/2022    2:36 PM 11/30/2021    1:21 PM  PHQ 2/9 Scores  PHQ - 2 Score 0 0 0 0 0 0 0  PHQ- 9 Score 0 4   2      Fall Risk    12/05/2022    3:05 PM 10/31/2022   10:31 AM 10/01/2022   11:05 AM 07/27/2022    1:08  PM 02/09/2022    4:06 PM  Fall Risk   Falls in the past year? 1 1 1 1  0  Number falls in past yr: 1 1 1  0 0  Injury with Fall? 1 1 1  0 0  Risk for fall due to :  No Fall Risks History of fall(s);Impaired balance/gait Impaired balance/gait History of fall(s)  Follow up Falls evaluation completed;Education provided;Falls prevention discussed Falls evaluation completed Falls evaluation completed Falls evaluation  completed Falls evaluation completed    MEDICARE RISK AT HOME: Medicare Risk at Home Any stairs in or around the home?: No If so, are there any without handrails?: No Home free of loose throw rugs in walkways, pet beds, electrical cords, etc?: Yes Adequate lighting in your home to reduce risk of falls?: Yes Life alert?: No Use of a cane, walker or w/c?: Yes Grab bars in the bathroom?: No Shower chair or bench in shower?: Yes Elevated toilet seat or a handicapped toilet?: No  TIMED UP AND GO:  Was the test performed?  No    Cognitive Function:    11/18/2017   11:33 AM 07/23/2016    4:14 PM  MMSE - Mini Mental State Exam  Orientation to time 5 5  Orientation to Place 5 5  Registration 3 3  Attention/ Calculation 5 4  Recall 1 2  Language- name 2 objects 2 2  Language- repeat 1 1  Language- follow 3 step command 3 3  Language- read & follow direction 1 1  Write a sentence 1 1  Copy design 1 1  Total score 28 28        12/05/2022    3:14 PM 11/24/2021    1:17 PM 11/17/2020    6:37 PM 11/24/2018    1:56 PM  6CIT Screen  What Year? 0 points 0 points 0 points 0 points  What month? 0 points 0 points 0 points 0 points  What time? 0 points 0 points 0 points 0 points  Count back from 20 0 points 0 points 0 points 0 points  Months in reverse 2 points 0 points 4 points 2 points  Repeat phrase 2 points 4 points 0 points 0 points  Total Score 4 points 4 points 4 points 2 points    Immunizations Immunization History  Administered Date(s) Administered   Fluad Quad(high Dose 65+) 11/24/2018, 01/08/2020, 01/26/2022   Influenza Split 01/22/2011, 02/20/2012   Influenza Whole 03/13/1999, 01/01/2008, 02/07/2009, 12/13/2009   Influenza, High Dose Seasonal PF 02/07/2016, 02/04/2017, 01/27/2018   Influenza,inj,Quad PF,6+ Mos 01/16/2013   Moderna Covid-19 Vaccine Bivalent Booster 21yrs & up 09/10/2020   PFIZER(Purple Top)SARS-COV-2 Vaccination 04/25/2019, 05/20/2019, 12/26/2019    Pneumococcal Conjugate-13 07/14/2015   Pneumococcal Polysaccharide-23 05/08/2006    TDAP status: Due, Education has been provided regarding the importance of this vaccine. Advised may receive this vaccine at local pharmacy or Health Dept. Aware to provide a copy of the vaccination record if obtained from local pharmacy or Health Dept. Verbalized acceptance and understanding.  Flu Vaccine status: Due, Education has been provided regarding the importance of this vaccine. Advised may receive this vaccine at local pharmacy or Health Dept. Aware to provide a copy of the vaccination record if obtained from local pharmacy or Health Dept. Verbalized acceptance and understanding.  Pneumococcal vaccine status: Up to date  Covid-19 vaccine status: Information provided on how to obtain vaccines.   Qualifies for Shingles Vaccine? Yes   Zostavax completed No   Shingrix Completed?: No.  Education has been provided regarding the importance of this vaccine. Patient has been advised to call insurance company to determine out of pocket expense if they have not yet received this vaccine. Advised may also receive vaccine at local pharmacy or Health Dept. Verbalized acceptance and understanding.  Screening Tests Health Maintenance  Topic Date Due   COVID-19 Vaccine (5 - 2023-24 season) 12/21/2022 (Originally 11/11/2022)   Zoster Vaccines- Shingrix (1 of 2) 03/06/2023 (Originally [birth year]20/1984)   INFLUENZA VACCINE  06/10/2023 (Originally 10/11/2022)   HEMOGLOBIN A1C  01/27/2023   OPHTHALMOLOGY EXAM  05/31/2023   FOOT EXAM  11/21/2023   Medicare Annual Wellness (AWV)  12/05/2023   DEXA SCAN  05/16/2024   Pneumonia Vaccine 75+ Years old  Completed   HPV VACCINES  Aged Out   DTaP/Tdap/Td  Discontinued    Health Maintenance    Bone Density status: Completed 2024. Results reflect: Bone density results: OSTEOPENIA. Repeat every   years.  Lung Cancer Screening: (Low Dose CT Chest recommended if Age 28-80 years, 20  pack-year currently smoking OR have quit w/in 15years.) does not qualify.   Lung Cancer Screening Referral:   Additional Screening:  Hepatitis C Screening  never  Vision Screening: Recommended annual ophthalmology exams for early detection of glaucoma and other disorders of the eye. Is the patient up to date with their annual eye exam?  Yes  Who is the provider or what is the name of the office in which the patient attends annual eye exams? [facility] If pt is not established with a provider, would they like to be referred to a provider to establish care? No .   Dental Screening: Recommended annual dental exams for proper oral hygiene  Nutrition Risk Assessment:  Has the patient had any N/V/D within the last 2 months?  No  Does the patient have any non-healing wounds?  No  Has the patient had any unintentional weight loss or weight gain?  No   Diabetes:  Is the patient diabetic?  Yes  If diabetic, was a CBG obtained today?  No  Did the patient bring in their glucometer from home?  No  How often do you monitor your CBG's? 2x day.   Financial Strains and Diabetes Management:  Are you having any financial strains with the device, your supplies or your medication? No .  Does the patient want to be seen by Chronic Care Management for management of their diabetes?  No  Would the patient like to be referred to a Nutritionist or for Diabetic Management?  No   Diabetic Exams:  Diabetic Eye Exam: Completed . Overdue for diabetic eye exam. Pt has been advised about the importance in completing this exam.   Diabetic Foot Exam: Pt has been advised about the importance in completing this exam.   Community Resource Referral / Chronic Care Management: CRR required this visit?  No   CCM required this visit?  No     Plan:     I have personally reviewed and noted the following in the patient's chart:   Medical and social history Use of alcohol, tobacco or illicit drugs  Current  medications and supplements including opioid prescriptions. Patient is not currently taking opioid prescriptions. Functional ability and status Nutritional status Physical activity Advanced directives List of other physicians Hospitalizations, surgeries, and ER visits in previous 12 months Vitals Screenings to include cognitive, depression, and falls Referrals and appointments  In addition, I have reviewed and discussed with patient certain preventive protocols, quality metrics,  and best practice recommendations. A written personalized care plan for preventive services as well as general preventive health recommendations were provided to patient.     Remi Haggard, LPN   [id number]   After Visit Summary: (MyChart) Due to this being a telephonic visit, the after visit summary with patients personalized plan was offered to patient via MyChart   Nurse Notes:

## 2022-12-05 NOTE — Patient Instructions (Signed)
Rebecca [name] , Thank you for taking time to come for your Medicare Wellness Visit. I appreciate your ongoing commitment to your health goals. Please review the following plan we discussed and let me know if I can assist you in the future.   Screening recommendations/referrals: Colonoscopy: no longer required Bone Density: up to date Recommended yearly ophthalmology/optometry visit for glaucoma screening and checkup Recommended yearly dental visit for hygiene and checkup  Vaccinations: Influenza vaccine: Education provided Pneumococcal vaccine: up to date Tdap vaccine: Education provided Shingles vaccine: Education provided    Advanced directives: yes not on file   Preventive Care 65 Years and Older, Female Preventive care refers to lifestyle choices and visits with your health care provider that can promote health and wellness. What does preventive care include? A yearly physical exam. This is also called an annual well check. Dental exams once or twice a year. Routine eye exams. Ask your health care provider how often you should have your eyes checked. Personal lifestyle choices, including: Daily care of your teeth and gums. Regular physical activity. Eating a healthy diet. Avoiding tobacco and drug use. Limiting alcohol use. Practicing safe sex. Taking low-dose aspirin every day. Taking vitamin and mineral supplements as recommended by your health care provider. What happens during an annual well check? The services and screenings done by your health care provider during your annual well check will depend on your age, overall health, lifestyle risk factors, and family history of disease. Counseling  Your health care provider may ask you questions about your: Alcohol use. Tobacco use. Drug use. Emotional well-being. Home and relationship well-being. Sexual activity. Eating habits. History of falls. Memory and ability to understand (cognition). Work and work  Astronomer. Reproductive health. Screening  You may have the following tests or measurements: Height, weight, and BMI. Blood pressure. Lipid and cholesterol levels. These may be checked every 5 years, or more frequently if you are over 6 years old. Skin check. Lung cancer screening. You may have this screening every year starting at age 25 if you have a 30-pack-year history of smoking and currently smoke or have quit within the past 15 years. Fecal occult blood test (FOBT) of the stool. You may have this test every year starting at age 64. Flexible sigmoidoscopy or colonoscopy. You may have a sigmoidoscopy every 5 years or a colonoscopy every 10 years starting at age 83. Hepatitis C blood test. Hepatitis B blood test. Sexually transmitted disease (STD) testing. Diabetes screening. This is done by checking your blood sugar (glucose) after you have not eaten for a while (fasting). You may have this done every 1-3 years. Bone density scan. This is done to screen for osteoporosis. You may have this done starting at age 54. Mammogram. This may be done every 1-2 years. Talk to your health care provider about how often you should have regular mammograms. Talk with your health care provider about your test results, treatment options, and if necessary, the need for more tests. Vaccines  Your health care provider may recommend certain vaccines, such as: Influenza vaccine. This is recommended every year. Tetanus, diphtheria, and acellular pertussis (Tdap, Td) vaccine. You may need a Td booster every 10 years. Zoster vaccine. You may need this after age 26. Pneumococcal 13-valent conjugate (PCV13) vaccine. One dose is recommended after age 69. Pneumococcal polysaccharide (PPSV23) vaccine. One dose is recommended after age 2. Talk to your health care provider about which screenings and vaccines you need and how often you need them. This information  is not intended to replace advice given to you by  your health care provider. Make sure you discuss any questions you have with your health care provider. Document Released: 03/25/2015 Document Revised: 11/16/2015 Document Reviewed: 12/28/2014 Elsevier Interactive Patient Education  2017 [employer].  Fall Prevention in the Home Falls can cause injuries. They can happen to people of all ages. There are many things you can do to make your home safe and to help prevent falls. What can I do on the outside of my home? Regularly fix the edges of walkways and driveways and fix any cracks. Remove anything that might make you trip as you walk through a door, such as a raised step or threshold. Trim any bushes or trees on the path to your home. Use bright outdoor lighting. Clear any walking paths of anything that might make someone trip, such as rocks or tools. Regularly check to see if handrails are loose or broken. Make sure that both sides of any steps have handrails. Any raised decks and porches should have guardrails on the edges. Have any leaves, snow, or ice cleared regularly. Use sand or salt on walking paths during winter. Clean up any spills in your garage right away. This includes oil or grease spills. What can I do in the bathroom? Use night lights. Install grab bars by the toilet and in the tub and shower. Do not use towel bars as grab bars. Use non-skid mats or decals in the tub or shower. If you need to sit down in the shower, use a plastic, non-slip stool. Keep the floor dry. Clean up any water that spills on the floor as soon as it happens. Remove soap buildup in the tub or shower regularly. Attach bath mats securely with double-sided non-slip rug tape. Do not have throw rugs and other things on the floor that can make you trip. What can I do in the bedroom? Use night lights. Make sure that you have a light by your bed that is easy to reach. Do not use any sheets or blankets that are too big for your bed. They should not hang  down onto the floor. Have a firm chair that has side arms. You can use this for support while you get dressed. Do not have throw rugs and other things on the floor that can make you trip. What can I do in the kitchen? Clean up any spills right away. Avoid walking on wet floors. Keep items that you use a lot in easy-to-reach places. If you need to reach something above you, use a strong step stool that has a grab bar. Keep electrical cords out of the way. Do not use floor polish or wax that makes floors slippery. If you must use wax, use non-skid floor wax. Do not have throw rugs and other things on the floor that can make you trip. What can I do with my stairs? Do not leave any items on the stairs. Make sure that there are handrails on both sides of the stairs and use them. Fix handrails that are broken or loose. Make sure that handrails are as long as the stairways. Check any carpeting to make sure that it is firmly attached to the stairs. Fix any carpet that is loose or worn. Avoid having throw rugs at the top or bottom of the stairs. If you do have throw rugs, attach them to the floor with carpet tape. Make sure that you have a light switch at the top of  the stairs and the bottom of the stairs. If you do not have them, ask someone to add them for you. What else can I do to help prevent falls? Wear shoes that: Do not have high heels. Have rubber bottoms. Are comfortable and fit you well. Are closed at the toe. Do not wear sandals. If you use a stepladder: Make sure that it is fully opened. Do not climb a closed stepladder. Make sure that both sides of the stepladder are locked into place. Ask someone to hold it for you, if possible. Clearly mark and make sure that you can see: Any grab bars or handrails. First and last steps. Where the edge of each step is. Use tools that help you move around (mobility aids) if they are needed. These  include: Canes. Walkers. Scooters. Crutches. Turn on the lights when you go into a dark area. Replace any light bulbs as soon as they burn out. Set up your furniture so you have a clear path. Avoid moving your furniture around. If any of your floors are uneven, fix them. If there are any pets around you, be aware of where they are. Review your medicines with your doctor. Some medicines can make you feel dizzy. This can increase your chance of falling. Ask your doctor what other things that you can do to help prevent falls. This information is not intended to replace advice given to you by your health care provider. Make sure you discuss any questions you have with your health care provider. Document Released: 12/23/2008 Document Revised: 08/04/2015 Document Reviewed: 04/02/2014 Elsevier Interactive Patient Education  2017 [employer].

## 2023-01-10 ENCOUNTER — Other Ambulatory Visit: Payer: Self-pay

## 2023-01-10 ENCOUNTER — Telehealth: Payer: Self-pay | Admitting: Internal Medicine

## 2023-01-10 DIAGNOSIS — Z794 Long term (current) use of insulin: Secondary | ICD-10-CM

## 2023-01-10 MED ORDER — TRULICITY 4.5 MG/0.5ML ~~LOC~~ SOAJ: 4.5000 mg | SUBCUTANEOUS | 2 refills | Status: DC

## 2023-01-10 NOTE — Telephone Encounter (Signed)
Prescription Request  01/10/2023  LOV: 10/31/2022  What is the name of the medication or equipment? trulicity  Have you contacted your pharmacy to request a refill? Yes   Which pharmacy would you like this sent to?   [facility] [address] ([address]), [place] - [address] [place]) [place] [address] Phone: [phone] Fax: [phone]    Patient notified that their request is being sent to the clinical staff for review and that they should receive a response within 2 business days.   Please advise at Mobile There is no such number on file (mobile).

## 2023-01-10 NOTE — Telephone Encounter (Signed)
Patient called to request a referral for home health care for personal assistance for 2 days a week, 4 hours both days. She said it should be through [facility] Patient has a physical scheduled for 01/17/2023. Best callback is [phone].

## 2023-01-10 NOTE — Telephone Encounter (Signed)
Faxed today

## 2023-01-11 NOTE — Telephone Encounter (Signed)
Spoke with patient today. 

## 2023-01-16 ENCOUNTER — Encounter: Payer: Self-pay | Admitting: Internal Medicine

## 2023-01-16 NOTE — Patient Instructions (Addendum)
Flu immunization administered today.      Blood work was ordered.   The lab is on the first floor.    Medications changes include :   none     Return in about 6 months (around 07/17/2023) for follow up.   Health Maintenance, Female Adopting a healthy lifestyle and getting preventive care are important in promoting health and wellness. Ask your health care provider about: The right schedule for you to have regular tests and exams. Things you can do on your own to prevent diseases and keep yourself healthy. What should I know about diet, weight, and exercise? Eat a healthy diet  Eat a diet that includes plenty of vegetables, fruits, low-fat dairy products, and lean protein. Do not eat a lot of foods that are high in solid fats, added sugars, or sodium. Maintain a healthy weight Body mass index (BMI) is used to identify weight problems. It estimates body fat based on height and weight. Your health care provider can help determine your BMI and help you achieve or maintain a healthy weight. Get regular exercise Get regular exercise. This is one of the most important things you can do for your health. Most adults should: Exercise for at least 150 minutes each week. The exercise should increase your heart rate and make you sweat (moderate-intensity exercise). Do strengthening exercises at least twice a week. This is in addition to the moderate-intensity exercise. Spend less time sitting. Even light physical activity can be beneficial. Watch cholesterol and blood lipids Have your blood tested for lipids and cholesterol at 87 years of age, then have this test every 5 years. Have your cholesterol levels checked more often if: Your lipid or cholesterol levels are high. You are older than 87 years of age. You are at high risk for heart disease. What should I know about cancer screening? Depending on your health history and family history, you may need to have cancer screening at  various ages. This may include screening for: Breast cancer. Cervical cancer. Colorectal cancer. Skin cancer. Lung cancer. What should I know about heart disease, diabetes, and high blood pressure? Blood pressure and heart disease High blood pressure causes heart disease and increases the risk of stroke. This is more likely to develop in people who have high blood pressure readings or are overweight. Have your blood pressure checked: Every 3-5 years if you are 73-70 years of age. Every year if you are 13 years old or older. Diabetes Have regular diabetes screenings. This checks your fasting blood sugar level. Have the screening done: Once every three years after age 63 if you are at a normal weight and have a low risk for diabetes. More often and at a younger age if you are overweight or have a high risk for diabetes. What should I know about preventing infection? Hepatitis B If you have a higher risk for hepatitis B, you should be screened for this virus. Talk with your health care provider to find out if you are at risk for hepatitis B infection. Hepatitis C Testing is recommended for: Everyone born from [birth year] through 1963. Anyone with known risk factors for hepatitis C. Sexually transmitted infections (STIs) Get screened for STIs, including gonorrhea and chlamydia, if: You are sexually active and are younger than 87 years of age. You are older than 88 years of age and your health care provider tells you that you are at risk for this type of infection. Your sexual activity has changed since  you were last screened, and you are at increased risk for chlamydia or gonorrhea. Ask your health care provider if you are at risk. Ask your health care provider about whether you are at high risk for HIV. Your health care provider may recommend a prescription medicine to help prevent HIV infection. If you choose to take medicine to prevent HIV, you should first get tested for HIV. You should then be  tested every 3 months for as long as you are taking the medicine. Pregnancy If you are about to stop having your period (premenopausal) and you may become pregnant, seek counseling before you get pregnant. Take 400 to 800 micrograms (mcg) of folic acid every day if you become pregnant. Ask for birth control (contraception) if you want to prevent pregnancy. Osteoporosis and menopause Osteoporosis is a disease in which the bones lose minerals and strength with aging. This can result in bone fractures. If you are 27 years old or older, or if you are at risk for osteoporosis and fractures, ask your health care provider if you should: Be screened for bone loss. Take a calcium or vitamin D supplement to lower your risk of fractures. Be given hormone replacement therapy (HRT) to treat symptoms of menopause. Follow these instructions at home: Alcohol use Do not drink alcohol if: Your health care provider tells you not to drink. You are pregnant, may be pregnant, or are planning to become pregnant. If you drink alcohol: Limit how much you have to: 0-1 drink a day. Know how much alcohol is in your drink. In the [place] one drink equals one 12 oz bottle of beer (355 mL), one 5 oz glass of wine (148 mL), or one 1 oz glass of hard liquor (44 mL). Lifestyle Do not use any products that contain nicotine or tobacco. These products include cigarettes, chewing tobacco, and vaping devices, such as e-cigarettes. If you need help quitting, ask your health care provider. Do not use street drugs. Do not share needles. Ask your health care provider for help if you need support or information about quitting drugs. General instructions Schedule regular health, dental, and eye exams. Stay current with your vaccines. Tell your health care provider if: You often feel depressed. You have ever been abused or do not feel safe at home. Summary Adopting a healthy lifestyle and getting preventive care are important in  promoting health and wellness. Follow your health care provider's instructions about healthy diet, exercising, and getting tested or screened for diseases. Follow your health care provider's instructions on monitoring your cholesterol and blood pressure. This information is not intended to replace advice given to you by your health care provider. Make sure you discuss any questions you have with your health care provider. Document Revised: 07/18/2020 Document Reviewed: 07/18/2020 Elsevier Patient Education  2024 [employer].

## 2023-01-16 NOTE — Progress Notes (Unsigned)
Subjective:    Patient ID: Rebecca [name], female    DOB: February 26, 1933, 89 y.o.   MRN: [id number]      HPI Rebecca [name] is here for a Physical exam and her chronic medical problems.   Needs DM shoes - will discuss with podiatry.  Her insurance told her she can only get help at home via medicaid direct and [employer] medicare-was told that something needs to be sent to [facility].  Has no strength in hands.  She has noticed that occasionally her left leg will move on its own-she wondered if this was a nerve issue.  She is having more difficulty doing things at home.  She tries to do what ever she can do on her own.  She is not able to drive because of her neuropathy.  She has neuropathy in both her hands and feet.  This results in numbness, tingling and decreased strength.  She has difficulty gripping with her hands.  Her balance is off because of her neuropathy.  She has to use a walker to ambulate.  She also has foot deformities which affects her balance and mobility.  She has bilateral knee arthritis.  She has chronic back pain.  Her back pain only hurts when she is up doing things-sitting down provides relief  Can not go to grocery store by herself - she needs help with the groceries-putting him in the car, picking them up-she tries to help as much as possible.      She is in need of assistance at home which I believe 8 hours a week would be sufficient.    Medications and allergies reviewed with patient and updated if appropriate.  Current Outpatient Medications on File Prior to Visit  Medication Sig Dispense Refill   acetaminophen (TYLENOL) 500 MG tablet Take 1,000 mg by mouth daily as needed for moderate pain.     Ascorbic Acid (VITAMIN C) 500 MG tablet Take 500 mg by mouth daily.     atorvastatin (LIPITOR) 10 MG tablet TAKE 1 TABLET ONE TIME DAILY AT 6PM 90 tablet 3   blood glucose meter kit and supplies KIT Dispense based on patient and insurance preference. Use up to four times  daily as directed. (FOR E11.9).Dispense based on patient and insurance preference. Use up to four times daily as directed. (FOR E11.9). Patient would like to stick with Accu-check meter if covered 1 each 0   calcium carbonate (OS-CAL) 600 MG TABS Take 600 mg by mouth daily.     Cod Liver Oil 1000 MG CAPS Take 1,000 mg by mouth daily.      diclofenac Sodium (VOLTAREN) 1 % GEL Apply 2 g topically 4 (four) times daily. 350 g 0   DORZOLAMIDE HCL-TIMOLOL MAL OP Place 1 drop into the right eye 2 (two) times daily.     Dulaglutide (TRULICITY) 4.5 MG/0.5ML SOAJ Inject 4.5 mg into the skin once a week. INJECT 4.5MG  (1 PEN) UNDER THE SKIN EVERY WEEK 2 mL 2   ELIQUIS 5 MG TABS tablet TAKE 1 TABLET TWICE DAILY (APPOINTMENT IS NEEDED FOR MORE REFILLS) 180 tablet 3   gabapentin (NEURONTIN) 300 MG capsule TAKE 1 CAPSULE EVERY DAY 90 capsule 3   Glucerna (GLUCERNA) LIQD Take 237 mLs by mouth daily.     glucose blood (ACCU-CHEK GUIDE) test strip TEST BLOOD SUGAR UP TO FOUR TIMES DAILY 400 strip 2   hydrochlorothiazide (HYDRODIURIL) 12.5 MG tablet Take 12.5 mg by mouth daily as needed (swelling).  insulin aspart (NOVOLOG FLEXPEN) 100 UNIT/ML FlexPen USE AS DIRECTED BY YOUR PRESCRIBER. COMPLETE DIRECTIONS ARE INCLUDED IN A LETTER WITH YOUR ORIGINAL ORDER 45 mL 3   Insulin Pen Needle (BD AUTOSHIELD DUO) 30G X 5 MM MISC USE AS DIRECTED WITH INSULIN PEN NEEDLE 4 TIMES DAILY 400 each 2   Lancet Devices (ONETOUCH DELICA PLUS LANCING) MISC USE AS DIRECTED 1 each 3   Lancets (ONETOUCH DELICA PLUS LANCET33G) MISC USE AS INSTRUCTED 400 each 3   latanoprost (XALATAN) 0.005 % ophthalmic solution Place 1 drop into both eyes at bedtime.     lisinopril (ZESTRIL) 40 MG tablet TAKE 1 TABLET EVERY DAY 90 tablet 2   Multiple Vitamin (MULTIVITAMIN) tablet Take 1 tablet by mouth daily. Prn     nitroGLYCERIN (NITROSTAT) 0.4 MG SL tablet DISSOLVE ONE TABLET UNDER TONGUE AS DIRECTED FOR CHEST PAIN 25 tablet 5   omeprazole (PRILOSEC) 20  MG capsule TAKE 1 CAPSULE EVERY DAY (STOP PANTOPRAZOLE) 90 capsule 10   RHOPRESSA 0.02 % SOLN Place 1 drop into the right eye at bedtime.     Turmeric 500 MG CAPS Take 1 capsule by mouth daily in the afternoon.     No current facility-administered medications on file prior to visit.    Review of Systems  Constitutional:  Negative for fever.  Eyes:  Negative for visual disturbance.       Does not see well  Respiratory:  Positive for shortness of breath (with moderate exertion - not new). Negative for cough and wheezing.   Cardiovascular:  Positive for leg swelling (controlled). Negative for chest pain and palpitations.  Gastrointestinal:  Negative for abdominal pain, blood in stool, constipation and diarrhea.       No gerd  Genitourinary:  Negative for dysuria.  Musculoskeletal:  Positive for arthralgias (knees, feet) and back pain (lower back pain when up and doing things).  Skin:  Negative for rash.  Neurological:  Positive for headaches (occ mild). Negative for light-headedness.  Psychiatric/Behavioral:  Negative for dysphoric mood. The patient is not nervous/anxious.        Objective:   Vitals:   01/17/23 1005  BP: 130/74  Pulse: 80  Temp: 98.4 F (36.9 C)  SpO2: 97%   Filed Weights   01/17/23 1005  Weight: 177 lb (80.3 kg)   Body mass index is 31.35 kg/m.  BP Readings from Last 3 Encounters:  01/17/23 130/74  10/31/22 112/64  10/01/22 122/62    Wt Readings from Last 3 Encounters:  01/17/23 177 lb (80.3 kg)  10/31/22 178 lb (80.7 kg)  10/01/22 179 lb (81.2 kg)       Physical Exam Constitutional: She appears well-developed and well-nourished. No distress.  HENT:  Head: Normocephalic and atraumatic.  Right Ear: External ear normal. Normal ear canal and TM Left Ear: External ear normal.  Normal ear canal and TM Mouth/Throat: Oropharynx is clear and moist.  Eyes: Conjunctivae normal.  Neck: Neck supple. No tracheal deviation present. No thyromegaly  present.  No carotid bruit  Cardiovascular: Normal rate, regular rhythm and normal heart sounds.   2/6 systolic murmur heard.  Mild b/l LE edema. Pulmonary/Chest: Effort normal and breath sounds normal. No respiratory distress. She has no wheezes. She has no rales.  Breast: deferred   Abdominal: Soft. She exhibits no distension. There is no tenderness. Musculoskeletal: Bilateral ankle/foot deformities-chronic Lymphadenopathy: She has no cervical adenopathy.  Skin: Skin is warm and dry. She is not diaphoretic.  Psychiatric: She has a normal mood  and affect. Her behavior is normal.     Lab Results  Component Value Date   WBC 4.9 10/01/2022   HGB 10.2 (L) 10/01/2022   HCT 31.7 (L) 10/01/2022   PLT 227.0 10/01/2022   GLUCOSE 155 (H) 09/23/2022   CHOL 181 07/27/2022   TRIG 65.0 07/27/2022   HDL 83.70 07/27/2022   LDLCALC 85 07/27/2022   ALT 15 09/23/2022   AST 22 09/23/2022   NA 139 09/23/2022   K 4.2 09/23/2022   CL 103 09/23/2022   CREATININE 1.34 (H) 09/23/2022   BUN 26 (H) 09/23/2022   CO2 24 09/23/2022   TSH 1.46 10/01/2022   INR 1.4 (H) 02/08/2022   HGBA1C 7.6 (H) 07/27/2022   MICROALBUR 1.9 10/24/2015         Assessment & Plan:   Physical exam: Screening blood work  ordered Exercise  minimal Weight  losing weight slowly with trulciity Substance abuse  none   Reviewed recommended immunizations.   Health Maintenance  Topic Date Due   COVID-19 Vaccine (5 - 2023-24 season) 02/02/2023 (Originally 11/11/2022)   Zoster Vaccines- Shingrix (1 of 2) 03/06/2023 (Originally 06/20/1984)   INFLUENZA VACCINE  06/10/2023 (Originally 10/11/2022)   HEMOGLOBIN A1C  01/27/2023   OPHTHALMOLOGY EXAM  05/31/2023   FOOT EXAM  11/21/2023   Medicare Annual Wellness (AWV)  12/05/2023   DEXA SCAN  05/16/2024   Pneumonia Vaccine 81+ Years old  Completed   HPV VACCINES  Aged Out   DTaP/Tdap/Td  Discontinued          See Problem List for Assessment and Plan of chronic medical  problems.

## 2023-01-17 ENCOUNTER — Other Ambulatory Visit: Payer: Medicare HMO

## 2023-01-17 ENCOUNTER — Telehealth: Payer: Self-pay

## 2023-01-17 ENCOUNTER — Ambulatory Visit: Payer: Medicare HMO | Admitting: Internal Medicine

## 2023-01-17 VITALS — BP 130/74 | HR 80 | Temp 98.4°F | Ht 63.0 in | Wt 177.0 lb

## 2023-01-17 DIAGNOSIS — G6289 Other specified polyneuropathies: Secondary | ICD-10-CM | POA: Diagnosis not present

## 2023-01-17 DIAGNOSIS — Z8639 Personal history of other endocrine, nutritional and metabolic disease: Secondary | ICD-10-CM | POA: Diagnosis not present

## 2023-01-17 DIAGNOSIS — I2510 Atherosclerotic heart disease of native coronary artery without angina pectoris: Secondary | ICD-10-CM | POA: Diagnosis not present

## 2023-01-17 DIAGNOSIS — R6889 Other general symptoms and signs: Secondary | ICD-10-CM | POA: Diagnosis not present

## 2023-01-17 DIAGNOSIS — E1342 Other specified diabetes mellitus with diabetic polyneuropathy: Secondary | ICD-10-CM

## 2023-01-17 DIAGNOSIS — R5381 Other malaise: Secondary | ICD-10-CM

## 2023-01-17 DIAGNOSIS — E1142 Type 2 diabetes mellitus with diabetic polyneuropathy: Secondary | ICD-10-CM

## 2023-01-17 DIAGNOSIS — I10 Essential (primary) hypertension: Secondary | ICD-10-CM

## 2023-01-17 DIAGNOSIS — G8929 Other chronic pain: Secondary | ICD-10-CM

## 2023-01-17 DIAGNOSIS — Z23 Encounter for immunization: Secondary | ICD-10-CM | POA: Diagnosis not present

## 2023-01-17 DIAGNOSIS — I480 Paroxysmal atrial fibrillation: Secondary | ICD-10-CM

## 2023-01-17 DIAGNOSIS — K219 Gastro-esophageal reflux disease without esophagitis: Secondary | ICD-10-CM

## 2023-01-17 DIAGNOSIS — N1831 Chronic kidney disease, stage 3a: Secondary | ICD-10-CM | POA: Diagnosis not present

## 2023-01-17 DIAGNOSIS — M8589 Other specified disorders of bone density and structure, multiple sites: Secondary | ICD-10-CM | POA: Diagnosis not present

## 2023-01-17 DIAGNOSIS — Z794 Long term (current) use of insulin: Secondary | ICD-10-CM | POA: Diagnosis not present

## 2023-01-17 DIAGNOSIS — M545 Low back pain, unspecified: Secondary | ICD-10-CM

## 2023-01-17 DIAGNOSIS — Z0000 Encounter for general adult medical examination without abnormal findings: Secondary | ICD-10-CM

## 2023-01-17 DIAGNOSIS — E7800 Pure hypercholesterolemia, unspecified: Secondary | ICD-10-CM | POA: Diagnosis not present

## 2023-01-17 DIAGNOSIS — E559 Vitamin D deficiency, unspecified: Secondary | ICD-10-CM

## 2023-01-17 LAB — LIPID PANEL
Cholesterol: 179 mg/dL (ref 0–200)
HDL: 77.9 mg/dL (ref >39.00)
LDL Cholesterol: 89 mg/dL (ref 0–99)
NonHDL: 100.81
Total CHOL/HDL Ratio: 2
Triglycerides: 60 mg/dL (ref 0.0–149.0)
VLDL: 12 mg/dL (ref 0.0–40.0)

## 2023-01-17 LAB — COMPREHENSIVE METABOLIC PANEL
ALT: 13 U/L (ref 0–35)
AST: 19 U/L (ref 0–37)
Albumin: 3.7 g/dL (ref 3.5–5.2)
Alkaline Phosphatase: 59 U/L (ref 39–117)
BUN: 25 mg/dL — ABNORMAL HIGH (ref 6–23)
CO2: 27 meq/L (ref 19–32)
Calcium: 9.2 mg/dL (ref 8.4–10.5)
Chloride: 105 meq/L (ref 96–112)
Creatinine, Ser: 1.16 mg/dL (ref 0.40–1.20)
GFR: 42.07 mL/min — ABNORMAL LOW (ref >60.00)
Glucose, Bld: 95 mg/dL (ref 70–99)
Potassium: 4.7 meq/L (ref 3.5–5.1)
Sodium: 139 meq/L (ref 135–145)
Total Bilirubin: 0.4 mg/dL (ref 0.2–1.2)
Total Protein: 7 g/dL (ref 6.0–8.3)

## 2023-01-17 LAB — IBC PANEL
Iron: 91 ug/dL (ref 42–145)
Saturation Ratios: 36.5 % (ref 20.0–50.0)
TIBC: 249.2 ug/dL — ABNORMAL LOW (ref 250.0–450.0)
Transferrin: 178 mg/dL — ABNORMAL LOW (ref 212.0–360.0)

## 2023-01-17 LAB — CBC WITH DIFFERENTIAL/PLATELET
Basophils Absolute: 0.1 10*3/uL (ref 0.0–0.1)
Basophils Relative: 1.3 % (ref 0.0–3.0)
Eosinophils Absolute: 0.2 10*3/uL (ref 0.0–0.7)
Eosinophils Relative: 3.8 % (ref 0.0–5.0)
HCT: 31.9 % — ABNORMAL LOW (ref 36.0–46.0)
Hemoglobin: 10.3 g/dL — ABNORMAL LOW (ref 12.0–15.0)
Lymphocytes Relative: 41.4 % (ref 12.0–46.0)
Lymphs Abs: 1.7 10*3/uL (ref 0.7–4.0)
MCHC: 32.4 g/dL (ref 30.0–36.0)
MCV: 82.4 fL (ref 78.0–100.0)
Monocytes Absolute: 0.6 10*3/uL (ref 0.1–1.0)
Monocytes Relative: 14.1 % — ABNORMAL HIGH (ref 3.0–12.0)
Neutro Abs: 1.7 10*3/uL (ref 1.4–7.7)
Neutrophils Relative %: 39.4 % — ABNORMAL LOW (ref 43.0–77.0)
Platelets: 213 10*3/uL (ref 150.0–400.0)
RBC: 3.87 Mil/uL (ref 3.87–5.11)
RDW: 16.5 % — ABNORMAL HIGH (ref 11.5–15.5)
WBC: 4.2 10*3/uL (ref 4.0–10.5)

## 2023-01-17 LAB — FERRITIN: Ferritin: 338.7 ng/mL — ABNORMAL HIGH (ref 10.0–291.0)

## 2023-01-17 LAB — VITAMIN D 25 HYDROXY (VIT D DEFICIENCY, FRACTURES): VITD: 41.15 ng/mL (ref 30.00–100.00)

## 2023-01-17 LAB — TSH: TSH: 0.79 u[IU]/mL (ref 0.35–5.50)

## 2023-01-17 MED ORDER — LUMBAR BACK BRACE/SUPPORT PAD MISC
0 refills | Status: AC
Start: 2023-01-17 — End: ?

## 2023-01-17 NOTE — Assessment & Plan Note (Signed)
Chronic Has several medical problems contributing Has difficulty performing all of her ADLs Has neuropathy of hands and feet resulting in weakness and numbness.  She has poor strength in her hands, difficulty ambulating and is not able to drive as a result She has chronic back pain-not able to lift anything heavy or stand long periods of time She has bilateral knee arthritis resulting in balance issues and pain Her vision is not great and she is not able to see as well. She has bilateral foot/ankle deformities resulting in difficulty with ambulation She would benefit from assistance with her ADLs and at home-8 hours a week would be sufficient

## 2023-01-17 NOTE — Assessment & Plan Note (Addendum)
Chronic Pain in both feet and hands Also has numbness and decreased strength affecting her ability to grip and her ability to ambulate Not able to drive because of the neuropathy Pain was not ideally controlled We increased gabapentin to 300 mg at night at her last visit Continue gabapentin 300 mg at bedtime

## 2023-01-17 NOTE — Assessment & Plan Note (Signed)
Clinically euthyroid Check tsh

## 2023-01-17 NOTE — Assessment & Plan Note (Signed)
Chronic GERD controlled Continue omeprazole 20 mg daily  

## 2023-01-17 NOTE — Assessment & Plan Note (Signed)
Chronic DEXA up-to-date Severe osteopenia-low FRAX Continue calcium and vitamin D intake Encourage as much exercise as possible

## 2023-01-17 NOTE — Assessment & Plan Note (Signed)
Chronic Taking vitamin D daily Check vitamin D level  

## 2023-01-17 NOTE — Assessment & Plan Note (Signed)
Chronic Regular exercise and healthy diet encouraged Check lipid panel, CMP Continue atorvastatin 10 mg daily 

## 2023-01-17 NOTE — Assessment & Plan Note (Signed)
Chronic Has pain when she is standing for long periods-sitting down helps, but makes doing some of her ADLs difficult She would like a back brace-prescription given which would help her perform some of her ADLs

## 2023-01-17 NOTE — Telephone Encounter (Signed)
I contacted [facility] provider service line and was told patient would need to call and find out where information needed to be faxed to.

## 2023-01-17 NOTE — Telephone Encounter (Signed)
-----   Message from Pincus Sanes sent at 01/17/2023 12:14 PM EST ----- Regarding: Send note to [facility] When she was in for her physical she had spoken to [facility] recently and they said that she would qualify between [facility] and Medicaid for some home assistance-maybe 8 hours a week.  I did document this in my note.  She said we just have to send a note to [facility]-?

## 2023-01-17 NOTE — Addendum Note (Signed)
Addended by: Karma Ganja on: 01/17/2023 12:47 PM   Modules accepted: Orders

## 2023-01-17 NOTE — Assessment & Plan Note (Signed)
Chronic Following with cardiology No symptoms consistent with angina Continue atorvastatin 10 mg daily

## 2023-01-17 NOTE — Assessment & Plan Note (Signed)
Chronic Following with cardiology On Eliquis 5 mg twice daily CBC, CMP, TSH

## 2023-01-17 NOTE — Assessment & Plan Note (Signed)
Chronic Lab Results  Component Value Date   HGBA1C 7.6 (H) 07/27/2022   Sugars have been well-controlled for her age She does monitor her sugars closely and she will let me know if they are on the low side Continue Trulicity4.5 mg weekly Continue NovoLog sliding scale if needed

## 2023-01-17 NOTE — Assessment & Plan Note (Signed)
Chronic Stable CMP, CBC 

## 2023-01-17 NOTE — Assessment & Plan Note (Signed)
Chronic Blood pressure well-controlled CBC, CMP Denies lightheadedness Monitor BP at home Continue hydrochlorothiazide 12.5 mg daily as needed, lisinopril 40 mg daily

## 2023-01-18 LAB — HEMOGLOBIN A1C
Hgb A1c MFr Bld: 6 %{Hb} — ABNORMAL HIGH (ref <5.7)
Mean Plasma Glucose: 126 mg/dL
eAG (mmol/L): 7 mmol/L

## 2023-01-22 ENCOUNTER — Encounter: Payer: Medicare HMO | Admitting: Internal Medicine

## 2023-01-25 ENCOUNTER — Telehealth: Payer: Self-pay | Admitting: Internal Medicine

## 2023-01-25 NOTE — Telephone Encounter (Signed)
Pt has been every where searching for a back brace and she can't find that certain one. Pt is asking can she have some type of assistance with this. Please advise.

## 2023-01-29 ENCOUNTER — Ambulatory Visit: Payer: Medicare HMO | Attending: Internal Medicine | Admitting: Internal Medicine

## 2023-01-30 DIAGNOSIS — R6889 Other general symptoms and signs: Secondary | ICD-10-CM | POA: Diagnosis not present

## 2023-01-30 DIAGNOSIS — H401133 Primary open-angle glaucoma, bilateral, severe stage: Secondary | ICD-10-CM | POA: Diagnosis not present

## 2023-01-30 NOTE — Telephone Encounter (Signed)
Spoke with patient today and recommended [facility] supply for brace.

## 2023-02-04 NOTE — Telephone Encounter (Signed)
Patient called back and said that she can not get help through her insurance.  She wants to know if Dr. Lawerance Bach know of any other way for her to get home help.  Please call patient at [phone]

## 2023-02-06 ENCOUNTER — Other Ambulatory Visit: Payer: Self-pay | Admitting: Internal Medicine

## 2023-02-14 NOTE — Telephone Encounter (Signed)
Message was left for patient last week.(Forgot to document)

## 2023-02-20 ENCOUNTER — Ambulatory Visit (INDEPENDENT_AMBULATORY_CARE_PROVIDER_SITE_OTHER): Payer: Medicare HMO | Admitting: Podiatry

## 2023-02-20 DIAGNOSIS — Z91199 Patient's noncompliance with other medical treatment and regimen due to unspecified reason: Secondary | ICD-10-CM

## 2023-02-20 NOTE — Progress Notes (Signed)
No show

## 2023-02-26 ENCOUNTER — Ambulatory Visit: Payer: Medicare HMO | Attending: Internal Medicine | Admitting: Internal Medicine

## 2023-02-26 DIAGNOSIS — I2510 Atherosclerotic heart disease of native coronary artery without angina pectoris: Secondary | ICD-10-CM

## 2023-02-26 DIAGNOSIS — R6889 Other general symptoms and signs: Secondary | ICD-10-CM | POA: Diagnosis not present

## 2023-02-26 DIAGNOSIS — I480 Paroxysmal atrial fibrillation: Secondary | ICD-10-CM

## 2023-02-26 NOTE — Patient Instructions (Signed)
Medication Instructions:  No changes  *If you need a refill on your cardiac medications before your next appointment, please call your pharmacy*   Lab Work: None needed  If you have labs (blood work) drawn today and your tests are completely normal, you will receive your results only by: MyChart Message (if you have MyChart) OR A paper copy in the mail If you have any lab test that is abnormal or we need to change your treatment, we will call you to review the results.   Testing/Procedures: None ordered    Follow-Up: At [facility], you and your health needs are our priority.  As part of our continuing mission to provide you with exceptional heart care, we have created designated Provider Care Teams.  These Care Teams include your primary Cardiologist (physician) and Advanced Practice Providers (APPs -  Physician Assistants and Nurse Practitioners) who all work together to provide you with the care you need, when you need it.    Your next appointment:   6 month(s)  Provider:   Maisie Fus, MD

## 2023-02-26 NOTE — Progress Notes (Signed)
Cardiology Office Note:    Date:  02/26/2023   ID:  [name], DOB 1932/06/16, MRN [id number]  PCP:  Pincus Sanes, MD   [facility] Providers Cardiologist:  Maisie Fus, MD     Referring MD: Pincus Sanes, MD   No chief complaint on file. Establish Care  History of Present Illness:    Rebecca [name] is a 89 y.o. female with a hx of CAD PCI in the 1990s s/p PCI x2, DMII,  HTN sickle cell trait referral to establish care. She last saw Dr. Shirlee Latch in 2016. No changes at that time. Since then she has been diagnosed with paroxysmal afib on eliquis. She has CKD stage IIIa. 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 She was found to have afib with RVR at this time.  She subsequently had a massive PE in 02/2020 causing syncope s/p tpa. Her echo showed normal LV function, no valve dx, PA pressure 46 mmHg, RV had normal function. She was dc'ed to a SNF. She had a cardiac monitor during this time that did not show afib.  She was in the nursing facility for a good while.  Today, she notes she can feel some fast heart racing at times. No syncope. No chest pressure with activities. She denies SOB. She is in a nursing skilled facility.  No orthopnea or PND.  She mainly conducts her activities of daily living without any issues. Her family comes in to help.  Interim hx 07/24/2022 She feels well today. No CP or SOB. Having difficulty with home chores. She uses a walker. Her son comes to check on her sometimes.  Notes BP well controlled at home.  Interim hx 02/26/2023 [name] comes in today for follow-up. She is staying local for the holiday. She is doing well. She denies chest pressure. She has to slow down with a hill. She walks with her walker. She walks a little bit for activity.  She can do many activities in the house however she is limited with arthritis.  Her bps are typically well controlled.  Past  Medical History:  Diagnosis Date   Anemia, unspecified    Ankle pain, left    Arthritis    CAD (coronary artery disease)    Esophageal reflux    Fibrocystic breast disease    Hiatal hernia    Hypercholesteremia    Hypertension    IDDM (insulin dependent diabetes mellitus)    Type II   Multinodular goiter    Sickle-cell trait (HCC)     Past Surgical History:  Procedure Laterality Date   "FEMALE"  1976   BACK SURGERY  1990   BIOPSY THYROID     Dr.Ellison-Neg    CORONARY ANGIOPLASTY     FOOT SURGERY  2004   RETINAL LASER PROCEDURE      Current Medications: Current Outpatient Medications on File Prior to Visit  Medication Sig Dispense Refill   acetaminophen (TYLENOL) 500 MG tablet Take 1,000 mg by mouth daily as needed for moderate pain.     Ascorbic Acid (VITAMIN C) 500 MG tablet Take 500 mg by mouth daily.     atorvastatin (LIPITOR) 10 MG tablet TAKE 1 TABLET ONE TIME DAILY AT 6PM 90 tablet 3   blood glucose meter kit and supplies KIT Dispense based on patient and insurance preference. Use up to four times daily as directed. (FOR E11.9).Dispense based on patient and insurance preference.  Use up to four times daily as directed. (FOR E11.9). Patient would like to stick with Accu-check meter if covered 1 each 0   calcium carbonate (OS-CAL) 600 MG TABS Take 600 mg by mouth daily.     Cod Liver Oil 1000 MG CAPS Take 1,000 mg by mouth daily.      diclofenac Sodium (VOLTAREN) 1 % GEL Apply 2 g topically 4 (four) times daily. 350 g 0   DORZOLAMIDE HCL-TIMOLOL MAL OP Place 1 drop into the right eye 2 (two) times daily.     Dulaglutide (TRULICITY) 4.5 MG/0.5ML SOAJ Inject 4.5 mg into the skin once a week. INJECT 4.5MG  (1 PEN) UNDER THE SKIN EVERY WEEK 2 mL 2   Elastic Bandages & Supports (LUMBAR BACK BRACE/SUPPORT PAD) MISC UAD when standing long periods of time for chronic back pain 1 each 0   ELIQUIS 5 MG TABS tablet TAKE 1 TABLET TWICE DAILY (APPOINTMENT IS NEEDED FOR MORE REFILLS)  180 tablet 3   gabapentin (NEURONTIN) 300 MG capsule TAKE 1 CAPSULE EVERY DAY 90 capsule 3   Glucerna (GLUCERNA) LIQD Take 237 mLs by mouth daily.     glucose blood (ACCU-CHEK GUIDE) test strip TEST BLOOD SUGAR UP TO FOUR TIMES DAILY 400 strip 2   hydrochlorothiazide (HYDRODIURIL) 12.5 MG tablet Take 12.5 mg by mouth daily as needed (swelling).     insulin aspart (NOVOLOG FLEXPEN) 100 UNIT/ML FlexPen USE AS DIRECTED BY YOUR PRESCRIBER. COMPLETE DIRECTIONS ARE INCLUDED IN A LETTER WITH YOUR ORIGINAL ORDER 45 mL 3   Insulin Pen Needle (BD AUTOSHIELD DUO) 30G X 5 MM MISC USE AS DIRECTED WITH INSULIN PEN NEEDLE 4 TIMES DAILY 400 each 2   Lancet Devices (ONETOUCH DELICA PLUS LANCING) MISC USE AS DIRECTED 1 each 3   Lancets (ONETOUCH DELICA PLUS LANCET33G) MISC USE AS INSTRUCTED 400 each 3   latanoprost (XALATAN) 0.005 % ophthalmic solution Place 1 drop into both eyes at bedtime.     lisinopril (ZESTRIL) 40 MG tablet TAKE 1 TABLET EVERY DAY 90 tablet 2   Multiple Vitamin (MULTIVITAMIN) tablet Take 1 tablet by mouth daily. Prn     nitroGLYCERIN (NITROSTAT) 0.4 MG SL tablet DISSOLVE ONE TABLET UNDER TONGUE AS DIRECTED FOR CHEST PAIN 25 tablet 5   omeprazole (PRILOSEC) 20 MG capsule TAKE 1 CAPSULE EVERY DAY (STOP PANTOPRAZOLE) 90 capsule 10   RHOPRESSA 0.02 % SOLN Place 1 drop into the right eye at bedtime.     Turmeric 500 MG CAPS Take 1 capsule by mouth daily in the afternoon.     No current facility-administered medications on file prior to visit.     Allergies:   Codeine  Family History: The patient's family history includes Diabetes in her mother; Gout in her brother, brother, and sister; Heart attack in her father and mother; Heart disease in an other family member; Hypertension in an other family member; Lung cancer in an other family member; Thyroid disease in her brother.  ROS:   Please see the history of present illness.     All other systems reviewed and are negative.  EKGs/Labs/Other  Studies Reviewed:    The following studies were reviewed today:   EKG:  EKG is  ordered today.  The ekg ordered today demonstrates   NSR, poor R wave progression  11/13- NSR, poor r wave progression  07/24/2022- sinus brady HR 55 bpm, anterior Q  Recent Labs: 01/17/2023: ALT 13; BUN 25; Creatinine, Ser 1.16; Hemoglobin 10.3; Platelets 213.0; Potassium 4.7; Sodium  139; TSH 0.79   Recent Lipid Panel    Component Value Date/Time   CHOL 179 01/17/2023 1115   TRIG 60.0 01/17/2023 1115   HDL 77.90 01/17/2023 1115   CHOLHDL 2 01/17/2023 1115   VLDL 12.0 01/17/2023 1115   LDLCALC 89 01/17/2023 1115   LDLCALC 94 01/13/2021 1636     Risk Assessment/Calculations:    CHA2DS2-VASc Score =     This indicates a  % annual risk of stroke. The patient's score is based upon:            Physical Exam:    VS: Wt Readings from Last 3 Encounters:  01/17/23 177 lb (80.3 kg)  10/31/22 178 lb (80.7 kg)  10/01/22 179 lb (81.2 kg)     GEN: Well nourished, well developed in no acute distress HEENT: Normal NECK: No JVD; No carotid bruits LYMPHATICS: No lymphadenopathy CARDIAC: RRR, no murmurs, rubs, gallops RESPIRATORY:  Clear to auscultation without rales, wheezing or rhonchi  ABDOMEN: Soft, non-tender, non-distended MUSCULOSKELETAL:  No edema; No deformity  SKIN: Warm and dry NEUROLOGIC:  Alert and oriented x 3 PSYCHIATRIC:  Normal affect   ASSESSMENT:    Ischemic Heart Disease: She has hx of ischemic heart disease from the 1990s. No CHF. Continue statin. Not on ASA, on eliquis. She has not needed to use her SL nitro.  pAF: CHADS2VASC=6.  She had a recent monitor with no recurrence. Will continue eliquis. Will refrain from BB for now.   HTN:  Well controlled. Continue current regimen.  PLAN:    In order of problems listed above:  No changes Follow up in 6 months      Medication Adjustments/Labs and Tests Ordered: Current medicines are reviewed at length with the  patient today.  Concerns regarding medicines are outlined above.  No orders of the defined types were placed in this encounter.  No orders of the defined types were placed in this encounter.   There are no Patient Instructions on file for this visit.   Signed, Maisie Fus, MD  02/26/2023 1:50 PM    [facility]

## 2023-03-10 ENCOUNTER — Other Ambulatory Visit: Payer: Self-pay | Admitting: Internal Medicine

## 2023-03-11 ENCOUNTER — Other Ambulatory Visit: Payer: Self-pay | Admitting: Internal Medicine

## 2023-03-12 ENCOUNTER — Emergency Department (HOSPITAL_COMMUNITY): Payer: Medicare HMO

## 2023-03-12 ENCOUNTER — Encounter (HOSPITAL_COMMUNITY): Payer: Self-pay | Admitting: Emergency Medicine

## 2023-03-12 ENCOUNTER — Ambulatory Visit: Payer: Self-pay | Admitting: Internal Medicine

## 2023-03-12 ENCOUNTER — Emergency Department (HOSPITAL_COMMUNITY)
Admission: EM | Admit: 2023-03-12 | Discharge: 2023-03-12 | Disposition: A | Discharge status: Home / Self Care | Payer: Medicare HMO | Attending: Emergency Medicine | Admitting: Emergency Medicine

## 2023-03-12 ENCOUNTER — Other Ambulatory Visit: Payer: Self-pay

## 2023-03-12 DIAGNOSIS — W19XXXA Unspecified fall, initial encounter: Secondary | ICD-10-CM | POA: Diagnosis not present

## 2023-03-12 DIAGNOSIS — I2510 Atherosclerotic heart disease of native coronary artery without angina pectoris: Secondary | ICD-10-CM | POA: Insufficient documentation

## 2023-03-12 DIAGNOSIS — R42 Dizziness and giddiness: Secondary | ICD-10-CM | POA: Insufficient documentation

## 2023-03-12 DIAGNOSIS — Z794 Long term (current) use of insulin: Secondary | ICD-10-CM | POA: Diagnosis not present

## 2023-03-12 DIAGNOSIS — Z7901 Long term (current) use of anticoagulants: Secondary | ICD-10-CM | POA: Diagnosis not present

## 2023-03-12 DIAGNOSIS — Z79899 Other long term (current) drug therapy: Secondary | ICD-10-CM | POA: Insufficient documentation

## 2023-03-12 LAB — COMPREHENSIVE METABOLIC PANEL
ALT: 13 U/L (ref 0–44)
AST: 19 U/L (ref 15–41)
Albumin: 3.2 g/dL — ABNORMAL LOW (ref 3.5–5.0)
Alkaline Phosphatase: 50 U/L (ref 38–126)
Anion gap: 8 (ref 5–15)
BUN: 30 mg/dL — ABNORMAL HIGH (ref 8–23)
CO2: 23 mmol/L (ref 22–32)
Calcium: 9.3 mg/dL (ref 8.9–10.3)
Chloride: 109 mmol/L (ref 98–111)
Creatinine, Ser: 1.22 mg/dL — ABNORMAL HIGH (ref 0.44–1.00)
GFR, Estimated: 43 mL/min — ABNORMAL LOW (ref >60)
Glucose, Bld: 204 mg/dL — ABNORMAL HIGH (ref 70–99)
Potassium: 4.6 mmol/L (ref 3.5–5.1)
Sodium: 140 mmol/L (ref 135–145)
Total Bilirubin: 0.2 mg/dL (ref 0.0–1.2)
Total Protein: 6.3 g/dL — ABNORMAL LOW (ref 6.5–8.1)

## 2023-03-12 LAB — DIFFERENTIAL
Abs Immature Granulocytes: 0.02 10*3/uL (ref 0.00–0.07)
Basophils Absolute: 0.1 10*3/uL (ref 0.0–0.1)
Basophils Relative: 1 %
Eosinophils Absolute: 0.1 10*3/uL (ref 0.0–0.5)
Eosinophils Relative: 2 %
Immature Granulocytes: 0 %
Lymphocytes Relative: 20 %
Lymphs Abs: 1 10*3/uL (ref 0.7–4.0)
Monocytes Absolute: 0.5 10*3/uL (ref 0.1–1.0)
Monocytes Relative: 10 %
Neutro Abs: 3.5 10*3/uL (ref 1.7–7.7)
Neutrophils Relative %: 67 %

## 2023-03-12 LAB — I-STAT CHEM 8, ED
BUN: 27 mg/dL — ABNORMAL HIGH (ref 8–23)
Calcium, Ion: 1.16 mmol/L (ref 1.15–1.40)
Chloride: 108 mmol/L (ref 98–111)
Creatinine, Ser: 1.2 mg/dL — ABNORMAL HIGH (ref 0.44–1.00)
Glucose, Bld: 195 mg/dL — ABNORMAL HIGH (ref 70–99)
HCT: 27 % — ABNORMAL LOW (ref 36.0–46.0)
Hemoglobin: 9.2 g/dL — ABNORMAL LOW (ref 12.0–15.0)
Potassium: 4.6 mmol/L (ref 3.5–5.1)
Sodium: 139 mmol/L (ref 135–145)
TCO2: 21 mmol/L — ABNORMAL LOW (ref 22–32)

## 2023-03-12 LAB — CBC
HCT: 29.2 % — ABNORMAL LOW (ref 36.0–46.0)
Hemoglobin: 9.5 g/dL — ABNORMAL LOW (ref 12.0–15.0)
MCH: 26.6 pg (ref 26.0–34.0)
MCHC: 32.5 g/dL (ref 30.0–36.0)
MCV: 81.8 fL (ref 80.0–100.0)
Platelets: 210 10*3/uL (ref 150–400)
RBC: 3.57 MIL/uL — ABNORMAL LOW (ref 3.87–5.11)
RDW: 16.1 % — ABNORMAL HIGH (ref 11.5–15.5)
WBC: 5.3 10*3/uL (ref 4.0–10.5)
nRBC: 0 % (ref 0.0–0.2)

## 2023-03-12 LAB — PROTIME-INR
INR: 1.2 (ref 0.8–1.2)
Prothrombin Time: 15.8 s — ABNORMAL HIGH (ref 11.4–15.2)

## 2023-03-12 LAB — ETHANOL: Alcohol, Ethyl (B): <10 mg/dL (ref <10)

## 2023-03-12 LAB — TROPONIN I (HIGH SENSITIVITY): Troponin I (High Sensitivity): 10 ng/L (ref <18)

## 2023-03-12 LAB — APTT: aPTT: 36 s (ref 24–36)

## 2023-03-12 MED ORDER — SODIUM CHLORIDE 0.9% FLUSH: 3.0000 mL | Freq: Once | INTRAVENOUS | Status: DC

## 2023-03-12 NOTE — ED Provider Notes (Addendum)
 [place] EMERGENCY DEPARTMENT AT [facility] Provider Note   CSN: [id number] Arrival date & time: 03/12/23  1138     History  Chief Complaint  Patient presents with   Dizziness   Fall    Rebecca [name] is a 89 y.o. female.  Patient here after she fell while walking with her walker.  She got little lightheaded fell forward.  Was able to catch herself.  She did hit her head but she is on a blood thinner.  Does not have any extremity pain.  She has left-sided weakness at baseline.  She feels much better now.  No dizziness lightheadedness.  Symptoms come and go at times but has noticed it more the last 2 days.  She denies any black or bloody stools.  Denies any chest pain shortness of breath fever chills.  No vision changes or speech changes.  Symptoms seem to have gotten better.  Is not having any extremity pain.  The history is provided by the patient.       Home Medications Prior to Admission medications   Medication Sig Start Date End Date Taking? Authorizing Provider  acetaminophen  (TYLENOL ) 500 MG tablet Take 1,000 mg by mouth daily as needed for moderate pain.    [provider]  Ascorbic Acid (VITAMIN C) 500 MG tablet Take 500 mg by mouth daily.    [provider]  atorvastatin  (LIPITOR) 10 MG tablet TAKE 1 TABLET ONE TIME DAILY AT 6PM 02/06/23   Geofm Glade PARAS, MD  blood glucose meter kit and supplies KIT Dispense based on patient and insurance preference. Use up to four times daily as directed. (FOR E11.9).Dispense based on patient and insurance preference. Use up to four times daily as directed. (FOR E11.9). Patient would like to stick with Accu-check meter if covered 03/16/22   Geofm Glade PARAS, MD  calcium  carbonate (OS-CAL) 600 MG TABS Take 600 mg by mouth daily.    [provider]  Cod Liver Oil 1000 MG CAPS Take 1,000 mg by mouth daily.     [provider]  diclofenac  Sodium (VOLTAREN ) 1 % GEL Apply 2 g topically 4  (four) times daily. 10/01/22   Merlynn Niki FALCON, FNP  DORZOLAMIDE  HCL-TIMOLOL  MAL OP Place 1 drop into the right eye 2 (two) times daily.    [provider]  Dulaglutide  (TRULICITY ) 4.5 MG/0.5ML SOAJ Inject 4.5 mg into the skin once a week. INJECT 4.5MG  (1 PEN) UNDER THE SKIN EVERY WEEK 01/10/23   Burns, Glade PARAS, MD  Elastic Bandages & Supports (LUMBAR BACK BRACE/SUPPORT PAD) MISC UAD when standing long periods of time for chronic back pain 01/17/23   Geofm Glade PARAS, MD  ELIQUIS  5 MG TABS tablet TAKE 1 TABLET TWICE DAILY (APPOINTMENT IS NEEDED FOR MORE REFILLS) 12/03/22   Geofm Glade PARAS, MD  gabapentin  (NEURONTIN ) 300 MG capsule TAKE 1 CAPSULE EVERY DAY 03/11/23   Burns, Glade PARAS, MD  Glucerna (GLUCERNA) LIQD Take 237 mLs by mouth daily. 01/22/20   [provider]  glucose blood (ACCU-CHEK GUIDE) test strip TEST BLOOD SUGAR UP TO FOUR TIMES DAILY 11/20/21   Geofm Glade PARAS, MD  hydrochlorothiazide  (HYDRODIURIL ) 12.5 MG tablet Take 12.5 mg by mouth daily as needed (swelling).    [provider]  insulin  aspart (NOVOLOG  FLEXPEN) 100 UNIT/ML FlexPen USE AS DIRECTED BY YOUR PRESCRIBER. COMPLETE DIRECTIONS ARE INCLUDED IN A LETTER WITH YOUR ORIGINAL ORDER 09/11/22   Geofm Glade PARAS, MD  Insulin  Pen Needle (BD  AUTOSHIELD DUO) 30G X 5 MM MISC USE AS DIRECTED WITH INSULIN  PEN NEEDLE 4 TIMES DAILY 04/03/21   Geofm Glade PARAS, MD  Lancet Devices [facility] PLUS LANCING) MISC USE AS DIRECTED 08/02/22   Geofm Glade PARAS, MD  Lancets [facility] DELICA PLUS [place]) MISC USE AS INSTRUCTED 03/11/23   Geofm Glade PARAS, MD  latanoprost  (XALATAN ) 0.005 % ophthalmic solution Place 1 drop into both eyes at bedtime. 01/11/20   [provider]  lisinopril  (ZESTRIL ) 40 MG tablet TAKE 1 TABLET EVERY DAY 09/20/22   Geofm Glade PARAS, MD  Multiple Vitamin (MULTIVITAMIN) tablet Take 1 tablet by mouth daily. Prn    [provider]  nitroGLYCERIN  (NITROSTAT ) 0.4 MG SL tablet DISSOLVE ONE TABLET  UNDER TONGUE AS DIRECTED FOR CHEST PAIN 08/05/13   Rolan Ezra RAMAN, MD  omeprazole  (PRILOSEC) 20 MG capsule TAKE 1 CAPSULE EVERY DAY (STOP PANTOPRAZOLE ) 01/29/22   Burns, Glade PARAS, MD  RHOPRESSA 0.02 % SOLN Place 1 drop into the right eye at bedtime. 02/05/22   [provider]  Turmeric 500 MG CAPS Take 1 capsule by mouth daily in the afternoon.    [provider]      Allergies    Codeine    Review of Systems   Review of Systems  Physical Exam Updated Vital Signs BP (!) 167/64   Pulse (!) 50   Temp (!) 97.4 F (36.3 C) (Oral)   Resp (!) 8   SpO2 100%  Physical Exam Vitals and nursing note reviewed.  Constitutional:      General: She is not in acute distress.    Appearance: She is well-developed. She is not ill-appearing.  HENT:     Head: Normocephalic and atraumatic.     Nose: Nose normal.     Mouth/Throat:     Mouth: Mucous membranes are moist.  Eyes:     Extraocular Movements: Extraocular movements intact.     Conjunctiva/sclera: Conjunctivae normal.     Pupils: Pupils are equal, round, and reactive to light.  Cardiovascular:     Rate and Rhythm: Normal rate and regular rhythm.     Pulses: Normal pulses.     Heart sounds: Normal heart sounds. No murmur heard. Pulmonary:     Effort: Pulmonary effort is normal. No respiratory distress.     Breath sounds: Normal breath sounds.  Abdominal:     Palpations: Abdomen is soft.     Tenderness: There is no abdominal tenderness.  Musculoskeletal:        General: No swelling.     Cervical back: Normal range of motion and neck supple.  Skin:    General: Skin is warm and dry.     Capillary Refill: Capillary refill takes less than 2 seconds.  Neurological:     Mental Status: She is alert.     Comments: She is able to ambulate with her walker, she is got a little bit of left-sided weakness compared to her right which she states is baseline, normal speech, normal visual fields, normal cranial nerves, normal  coordination  Psychiatric:        Mood and Affect: Mood normal.     ED Results / Procedures / Treatments   Labs (all labs ordered are listed, but only abnormal results are displayed) Labs Reviewed  PROTIME-INR - Abnormal; Notable for the following components:      Result Value   Prothrombin Time 15.8 (*)    All other components within normal limits  CBC -  Abnormal; Notable for the following components:   RBC 3.57 (*)    Hemoglobin 9.5 (*)    HCT 29.2 (*)    RDW 16.1 (*)    All other components within normal limits  COMPREHENSIVE METABOLIC PANEL - Abnormal; Notable for the following components:   Glucose, Bld 204 (*)    BUN 30 (*)    Creatinine, Ser 1.22 (*)    Total Protein 6.3 (*)    Albumin  3.2 (*)    GFR, Estimated 43 (*)    All other components within normal limits  I-STAT CHEM 8, ED - Abnormal; Notable for the following components:   BUN 27 (*)    Creatinine, Ser 1.20 (*)    Glucose, Bld 195 (*)    TCO2 21 (*)    Hemoglobin 9.2 (*)    HCT 27.0 (*)    All other components within normal limits  APTT  DIFFERENTIAL  ETHANOL  TROPONIN I (HIGH SENSITIVITY)    EKG EKG Interpretation Date/Time:  Tuesday March 12 2023 12:10:28 EST Ventricular Rate:  66 PR Interval:  210 QRS Duration:  74 QT Interval:  440 QTC Calculation: 461 R Axis:   -50  Text Interpretation: Sinus rhythm with 1st degree A-V block no changed from prior When compared with ECG of 23-Sep-2022 18:29, PREVIOUS ECG IS PRESENT Confirmed by Ruthe Cornet [phone]) on 03/12/2023 5:39:29 PM  Radiology CT HEAD WO CONTRAST Result Date: 03/12/2023 CLINICAL DATA:  Syncope/presyncope, cerebrovascular cause suspected. Fall. Dizziness. EXAM: CT HEAD WITHOUT CONTRAST TECHNIQUE: Contiguous axial images were obtained from the base of the skull through the vertex without intravenous contrast. RADIATION DOSE REDUCTION: This exam was performed according to the departmental dose-optimization program which includes  automated exposure control, adjustment of the mA and/or kV according to patient size and/or use of iterative reconstruction technique. COMPARISON:  Head CT 02/09/2022. FINDINGS: Brain: No acute hemorrhage. 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 No new loss of gray-white differentiation. No hydrocephalus or extra-axial collection. No mass effect or midline shift. Vascular: No hyperdense vessel or unexpected calcification. Skull: No calvarial fracture or suspicious bone lesion. Skull base is unremarkable. Sinuses/Orbits: No acute finding. Other: None. IMPRESSION: 1. No acute intracranial abnormality. 2. 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 Electronically Signed   By: Ryan Chess M.D.   On: 03/12/2023 14:53    Procedures Procedures    Medications Ordered in ED Medications  sodium chloride  flush (NS) 0.9 % injection 3 mL (has no administration in time range)    ED Course/ Medical Decision Making/ A&P                                 Medical Decision Making Amount and/or Complexity of Data Reviewed Labs: ordered. Radiology: ordered.   Patria ORLANDO THALMANN is here with lightheadedness.  Overall unremarkable vitals.  EKG shows sinus rhythm.  No ischemic changes.  History of high cholesterol CAD.  Neurologically she is intact.  She had an episode of dizziness this morning when she stood up she fell.  She was able to really catch herself.  Does not really have any pain but she is on Eliquis .  She has already had blood work done prior to my evaluation.  Is not having any extremity pain.  She is neurologically intact.  She is able to ambulate with her walker with me in the room.  She is not having any dizziness or lightheadedness.  Her orthostatics are unremarkable.  Her vital signs are unremarkable.  Her  EKG is reassuring.  She has had labs including troponin CBC CMP CT scan of the head completed prior to my evaluation.  Per my review interpretation labs there is no significant anemia or electrolyte abnormality or kidney injury or leukocytosis.  Head CT is unremarkable.  Overall her lab work is reassuring.  She is clinically reassuring.  She is able to ambulate with her walker which is her baseline.  Overall I suspect dizziness likely from mild orthostasis or some peripheral vertigo type process.  She has had this happen before.  She has some left-sided weakness at baseline has some ambulation issues at baseline.  She has a little bit of bradycardia here but is sinus rhythm.  Could be some medication related process to and recommend that she follow-up with her primary care doctor for reevaluation may be medicine evaluation.  But at this time I do not think there is a stroke or other acute process.  She is safe for discharge.  She understands return precautions.  This chart was dictated using voice recognition software.  Despite best efforts to proofread,  errors can occur which can change the documentation meaning.         Final Clinical Impression(s) / ED Diagnoses Final diagnoses:  Lightheadedness    Rx / DC Orders ED Discharge Orders     None         Ruthe Cornet, DO 03/12/23 1909    Ruthe Cornet, DO 03/12/23 1926

## 2023-03-12 NOTE — Telephone Encounter (Signed)
 Chief Complaint: dizziness Symptoms: dizzy, fall Frequency: began yesterday Pertinent Negatives: Patient denies SOB, Chest pain, nausea, LOC Disposition: [x] ED /[] Urgent Care (no appt availability in office) / [] Appointment(In office/virtual)/ []  [place] [facility]/ [] Home Care/ [] Refused Recommended Disposition /[] [place] [facility]/ []  Follow-up with PCP Additional Notes: Patient called stating she has been experiencing dizziness and lightheadedness with movement since yesterday. States last night in the middle of the night she had a fall when trying to get up and fell to her knees. She reports that did not hit her head, denies LOC. She reports that this morning when she was trying to sit down in her chair she fell into it and she vomited one time. States she is on blood thinner. She denies chest pain, SOB, nausea, LOC, or head injury. Son is present with patient and per patient is taking her to the ED for evaluation. Patient is asking for a callback from PCP regarding her blood thinner, she believes that is the cause of her dizziness. Per protocol, ED for eval. Care advice reviewed, patient verbalized understanding. Alerting PCP for review and follow-up.   Copied from CRM [id number]. Topic: Clinical - Red Word Triage >> Mar 12, 2023 10:32 AM Rebecca [name] wrote: Reason for CRM: Pt called stating she is on blood thinners and have been dizzy yesterday and today. Pt stated she had a bad fall last night and is headed to the ER today. Pt foot and knee is sore because she fell on her knees and the pt does not know if the blood thinners need to be changed or not. Pt stated she almost fell again today but she caught herself Reason for Disposition  SEVERE dizziness (e.g., unable to stand, requires support to walk, feels like passing out now)  Answer Assessment - Initial Assessment Questions 1. DESCRIPTION: Describe your dizziness.     Feels lightheaded, worse with movement 2. LIGHTHEADED: Do you  feel lightheaded? (e.g., somewhat faint, woozy, weak upon standing)     Yes, and had a fall 3. VERTIGO: Do you feel like either you or the room is spinning or tilting? (i.e. vertigo)     Denies 4. SEVERITY: How bad is it?  Do you feel like you are going to faint? Can you stand and walk?   - MILD: Feels slightly dizzy, but walking normally.   - MODERATE: Feels unsteady when walking, but not falling; interferes with normal activities (e.g., school, work).   - SEVERE: Unable to walk without falling, or requires assistance to walk without falling; feels like passing out now.      Severe 5. ONSET:  When did the dizziness begin?     Yesterday, worsening last night- states that she had a fall 6. AGGRAVATING FACTORS: Does anything make it worse? (e.g., standing, change in head position)     Movement 7. HEART RATE: Can you tell me your heart rate? How many beats in 15 seconds?  (Note: not all patients can do this)       Does not have ability to do that. 8. CAUSE: What do you think is causing the dizziness?     Blood thinner 9. RECURRENT SYMPTOM: Have you had dizziness before? If Yes, ask: When was the last time? What happened that time?     One time before, also had a fall. Several months ago 10. OTHER SYMPTOMS: Do you have any other symptoms? (e.g., fever, chest pain, vomiting, diarrhea, bleeding)       Vomited this morning when  sitting.  Protocols used: Dizziness - Lightheadedness-A-AH

## 2023-03-12 NOTE — ED Notes (Signed)
Discharge instructions reviewed with patient. Patient questions answered and opportunity for education reviewed. Patient voices understanding of discharge instructions with no further questions. Patient to lobby via wheelchair. 

## 2023-03-12 NOTE — Telephone Encounter (Signed)
Patient currently at ED now.

## 2023-03-12 NOTE — Telephone Encounter (Signed)
Message left for patient to return call to clinic.

## 2023-03-12 NOTE — ED Provider Triage Note (Signed)
 Emergency Medicine Provider Triage Evaluation Note  Rebecca [name] , a 89 y.o. female  was evaluated in triage.  Pt complains of dizziness since yesterday.  States she fell yesterday due to this dizziness, but did not hit her head.  Dizziness has persisted into this morning and is worse when she bends over to pick something up..  Denies any new weakness.  Denies any chest pain or shortness of breath.  Denies any vision changes.  Review of Systems  Positive: As above Negative: As above  Physical Exam  BP (!) 150/77   Pulse 71   Temp 98.1 F (36.7 C) (Oral)   Resp 16   SpO2 98%  Gen:   Awake, no distress   Resp:  Normal effort  MSK:   Moves extremities without difficulty    Medical Decision Making  Medically screening exam initiated at 12:15 PM.  Appropriate orders placed.  Rebecca [name] was informed that the remainder of the evaluation will be completed by another provider, this initial triage assessment does not replace that evaluation, and the importance of remaining in the ED until their evaluation is complete.     Veta Palma, PA-C 03/12/23 1216

## 2023-03-12 NOTE — Discharge Instructions (Signed)
Lab work today is normal.  As we discussed please take your time when you change positions.  You did very well walking with her walker today.  Continue to take your time changing positions and walking.  Follow-up with your primary care doctor.

## 2023-03-12 NOTE — ED Triage Notes (Signed)
 Pt reports dizziness since yesterday.  No new weakness.  Reports history of L sided weakness from previous fall several months ago.  Pt fell yesterday due to dizziness and reports bilateral knee and foot pain.  No arm drift.  Pt takes blood thinner.  Denies hitting her head.  States she almost fell this morning.

## 2023-03-14 ENCOUNTER — Emergency Department (HOSPITAL_COMMUNITY): Payer: Medicare HMO

## 2023-03-14 ENCOUNTER — Ambulatory Visit: Payer: Self-pay | Admitting: Internal Medicine

## 2023-03-14 ENCOUNTER — Emergency Department (HOSPITAL_COMMUNITY)
Admission: EM | Admit: 2023-03-14 | Discharge: 2023-03-15 | Disposition: A | Discharge status: Home / Self Care | Payer: Medicare HMO | Attending: Emergency Medicine | Admitting: Emergency Medicine

## 2023-03-14 DIAGNOSIS — M1712 Unilateral primary osteoarthritis, left knee: Secondary | ICD-10-CM | POA: Diagnosis not present

## 2023-03-14 DIAGNOSIS — R42 Dizziness and giddiness: Secondary | ICD-10-CM | POA: Diagnosis not present

## 2023-03-14 DIAGNOSIS — M25562 Pain in left knee: Secondary | ICD-10-CM | POA: Diagnosis not present

## 2023-03-14 DIAGNOSIS — I10 Essential (primary) hypertension: Secondary | ICD-10-CM | POA: Insufficient documentation

## 2023-03-14 DIAGNOSIS — Z794 Long term (current) use of insulin: Secondary | ICD-10-CM | POA: Insufficient documentation

## 2023-03-14 DIAGNOSIS — W1830XA Fall on same level, unspecified, initial encounter: Secondary | ICD-10-CM | POA: Diagnosis not present

## 2023-03-14 DIAGNOSIS — E119 Type 2 diabetes mellitus without complications: Secondary | ICD-10-CM | POA: Insufficient documentation

## 2023-03-14 DIAGNOSIS — K449 Diaphragmatic hernia without obstruction or gangrene: Secondary | ICD-10-CM | POA: Diagnosis not present

## 2023-03-14 DIAGNOSIS — Z043 Encounter for examination and observation following other accident: Secondary | ICD-10-CM | POA: Diagnosis not present

## 2023-03-14 DIAGNOSIS — K5730 Diverticulosis of large intestine without perforation or abscess without bleeding: Secondary | ICD-10-CM | POA: Diagnosis not present

## 2023-03-14 DIAGNOSIS — Z79899 Other long term (current) drug therapy: Secondary | ICD-10-CM | POA: Insufficient documentation

## 2023-03-14 DIAGNOSIS — S8991XA Unspecified injury of right lower leg, initial encounter: Secondary | ICD-10-CM | POA: Diagnosis not present

## 2023-03-14 DIAGNOSIS — Z7901 Long term (current) use of anticoagulants: Secondary | ICD-10-CM | POA: Diagnosis not present

## 2023-03-14 DIAGNOSIS — M47816 Spondylosis without myelopathy or radiculopathy, lumbar region: Secondary | ICD-10-CM | POA: Diagnosis not present

## 2023-03-14 DIAGNOSIS — S301XXA Contusion of abdominal wall, initial encounter: Secondary | ICD-10-CM | POA: Diagnosis not present

## 2023-03-14 DIAGNOSIS — S8992XA Unspecified injury of left lower leg, initial encounter: Secondary | ICD-10-CM | POA: Diagnosis not present

## 2023-03-14 DIAGNOSIS — R109 Unspecified abdominal pain: Secondary | ICD-10-CM | POA: Diagnosis present

## 2023-03-14 DIAGNOSIS — R29818 Other symptoms and signs involving the nervous system: Secondary | ICD-10-CM | POA: Diagnosis not present

## 2023-03-14 DIAGNOSIS — R296 Repeated falls: Secondary | ICD-10-CM | POA: Insufficient documentation

## 2023-03-14 DIAGNOSIS — S0990XA Unspecified injury of head, initial encounter: Secondary | ICD-10-CM | POA: Diagnosis not present

## 2023-03-14 DIAGNOSIS — M25561 Pain in right knee: Secondary | ICD-10-CM | POA: Diagnosis not present

## 2023-03-14 DIAGNOSIS — K8020 Calculus of gallbladder without cholecystitis without obstruction: Secondary | ICD-10-CM | POA: Diagnosis not present

## 2023-03-14 DIAGNOSIS — I6789 Other cerebrovascular disease: Secondary | ICD-10-CM | POA: Diagnosis not present

## 2023-03-14 DIAGNOSIS — R531 Weakness: Secondary | ICD-10-CM | POA: Diagnosis not present

## 2023-03-14 DIAGNOSIS — W19XXXA Unspecified fall, initial encounter: Secondary | ICD-10-CM

## 2023-03-14 DIAGNOSIS — M1711 Unilateral primary osteoarthritis, right knee: Secondary | ICD-10-CM | POA: Diagnosis not present

## 2023-03-14 DIAGNOSIS — R29898 Other symptoms and signs involving the musculoskeletal system: Secondary | ICD-10-CM

## 2023-03-14 NOTE — Telephone Encounter (Signed)
  Chief Complaint: fall  Symptoms: dizziness, fall, right sided body and back pain, weakness Frequency: fall occurred last night Pertinent Negatives: Patient denies fever, chest pain, SOB, head injury, neck pain  Disposition: [x] ED /[] Urgent Care (no appt availability in office) / [] Appointment(In office/virtual)/ []  [place] [facility]/ [] Home Care/ [] Refused Recommended Disposition /[] [place] [facility]/ []  Follow-up with PCP Additional Notes: Patient [name]/o dizziness, weakness and increased occurrence of falls. Pt states most recent fall was last night, fell and injured right side of body. Patient [name]/o severe right back pain with movement. Patient states she is on Eliquis . Pt agreeable to ED disposition.  Copied from CRM [id number]. Topic: Clinical - Red Word Triage >> Mar 14, 2023  3:19 PM Rebecca [name] wrote: Red Word that prompted transfer to Nurse Triage: pt called and stated that she fell again last night and fell backwards. She stated that this is her second time falling this week and she is having really bad pain on her side and knees. She rated the pain a 9/10. Reason for Disposition  Injury (or injuries) that need emergency care  Answer Assessment - Initial Assessment Questions 1. MECHANISM: How did the fall happen?     Patient states she was pulling a throw blanket off her bed and as she was pulling she turned and fell backwards.  2. DOMESTIC VIOLENCE AND ELDER ABUSE SCREENING: Did you fall because someone pushed you or tried to hurt you? If Yes, ask: Are you safe now?     No.  3. ONSET: When did the fall happen? (e.g., minutes, hours, or days ago)     Last night.  4. LOCATION: What part of the body hit the ground? (e.g., back, buttocks, head, hips, knees, hands, head, stomach)     Patient states her right side of her body going into her back hit the corner of the bed frame and the floor.  5. INJURY: Did you hurt (injure) yourself when you fell? If Yes, ask: What did  you injure? Tell me more about this? (e.g., body area; type of injury; pain severity)     Right side of body and right side of back.  6. PAIN: Is there any pain? If Yes, ask: How bad is the pain? (e.g., Scale 1-10; or mild,  moderate, severe)   - NONE (0): No pain   - MILD (1-3): Doesn't interfere with normal activities    - MODERATE (4-7): Interferes with normal activities or awakens from sleep    - SEVERE (8-10): Excruciating pain, unable to do any normal activities      2/10 reclining in chair. Patient states when she tries to stand up 10/10 pain.  7. SIZE: For cuts, bruises, or swelling, ask: How large is it? (e.g., inches or centimeters)      Patient states her granddaughter told her she has a cut on the right side of her back and she is unable to see it.  8. OTHER SYMPTOMS: Do you have any other symptoms? (e.g., dizziness, fever, weakness; new onset or worsening).      Dizziness x 3 days, numbness in left hand x couple of months, weakness  9. CAUSE: What do you think caused the fall (or falling)? (e.g., tripped, dizzy spell)       Patient states she had been dizzy all day yesterday, she states she was lightheaded when the fall occurred.  Protocols used: Falls and [facility]

## 2023-03-14 NOTE — ED Triage Notes (Signed)
 Pt states that last night she was bent over to pull a blanket off of her bed and when she stood up she got dizzy and fell backwards, onto her R side. Pt anticoagulated on Eliquis , but states that she did not hit her head. Pt c/o R rib pain and bilateral knee pain.

## 2023-03-14 NOTE — ED Provider Triage Note (Signed)
 Emergency Medicine Provider Triage Evaluation Note  LESTER PLATAS , a 89 y.o. female  was evaluated in triage.  Pt complains of fall onto her right side when she was feeling dizzy.  Denies hitting her head or loss of consciousness.  Reports recent increase in falls recently.  Evaluating on 12/31 for similar complaints.  Review of Systems  Positive:  Negative:   Physical Exam  BP (!) 118/59 (BP Location: Right Arm)   Pulse 77   Temp 97.6 F (36.4 C) (Oral)   Resp 14   Ht 5' 3 (1.6 m)   Wt 80.3 kg   SpO2 99%   BMI 31.35 kg/m  Gen:   Awake, no distress   Resp:  Normal effort  MSK:   Right flank with area of ecchymosis, superficial abrasion, tender without gross deformity, moves upper extremities without difficulty, lower extremities with pain from prior falls Other:  No gross neurodeficits  Medical Decision Making  Medically screening exam initiated at 5:18 PM.  Appropriate orders placed.  GALILEA QUITO was informed that the remainder of the evaluation will be completed by another provider, this initial triage assessment does not replace that evaluation, and the importance of remaining in the ED until their evaluation is complete.     Donnajean Lynwood DEL, PA-C 03/14/23 1732

## 2023-03-15 ENCOUNTER — Telehealth: Payer: Self-pay | Admitting: Internal Medicine

## 2023-03-15 ENCOUNTER — Emergency Department (HOSPITAL_COMMUNITY): Payer: Medicare HMO

## 2023-03-15 DIAGNOSIS — K8020 Calculus of gallbladder without cholecystitis without obstruction: Secondary | ICD-10-CM | POA: Diagnosis not present

## 2023-03-15 DIAGNOSIS — R29818 Other symptoms and signs involving the nervous system: Secondary | ICD-10-CM | POA: Diagnosis not present

## 2023-03-15 DIAGNOSIS — M25561 Pain in right knee: Secondary | ICD-10-CM | POA: Diagnosis not present

## 2023-03-15 DIAGNOSIS — M25562 Pain in left knee: Secondary | ICD-10-CM | POA: Diagnosis not present

## 2023-03-15 DIAGNOSIS — S0990XA Unspecified injury of head, initial encounter: Secondary | ICD-10-CM | POA: Diagnosis not present

## 2023-03-15 DIAGNOSIS — R42 Dizziness and giddiness: Secondary | ICD-10-CM | POA: Diagnosis not present

## 2023-03-15 DIAGNOSIS — S301XXA Contusion of abdominal wall, initial encounter: Secondary | ICD-10-CM | POA: Diagnosis not present

## 2023-03-15 DIAGNOSIS — S8991XA Unspecified injury of right lower leg, initial encounter: Secondary | ICD-10-CM | POA: Diagnosis not present

## 2023-03-15 DIAGNOSIS — M1712 Unilateral primary osteoarthritis, left knee: Secondary | ICD-10-CM | POA: Diagnosis not present

## 2023-03-15 DIAGNOSIS — S8992XA Unspecified injury of left lower leg, initial encounter: Secondary | ICD-10-CM | POA: Diagnosis not present

## 2023-03-15 DIAGNOSIS — M1711 Unilateral primary osteoarthritis, right knee: Secondary | ICD-10-CM | POA: Diagnosis not present

## 2023-03-15 DIAGNOSIS — K5730 Diverticulosis of large intestine without perforation or abscess without bleeding: Secondary | ICD-10-CM | POA: Diagnosis not present

## 2023-03-15 DIAGNOSIS — K449 Diaphragmatic hernia without obstruction or gangrene: Secondary | ICD-10-CM | POA: Diagnosis not present

## 2023-03-15 LAB — I-STAT CHEM 8, ED
BUN: 29 mg/dL — ABNORMAL HIGH (ref 8–23)
Calcium, Ion: 1.21 mmol/L (ref 1.15–1.40)
Chloride: 106 mmol/L (ref 98–111)
Creatinine, Ser: 1.4 mg/dL — ABNORMAL HIGH (ref 0.44–1.00)
Glucose, Bld: 123 mg/dL — ABNORMAL HIGH (ref 70–99)
HCT: 30 % — ABNORMAL LOW (ref 36.0–46.0)
Hemoglobin: 10.2 g/dL — ABNORMAL LOW (ref 12.0–15.0)
Potassium: 4.9 mmol/L (ref 3.5–5.1)
Sodium: 140 mmol/L (ref 135–145)
TCO2: 24 mmol/L (ref 22–32)

## 2023-03-15 LAB — COMPREHENSIVE METABOLIC PANEL
ALT: 14 U/L (ref 0–44)
AST: 18 U/L (ref 15–41)
Albumin: 3.3 g/dL — ABNORMAL LOW (ref 3.5–5.0)
Alkaline Phosphatase: 51 U/L (ref 38–126)
Anion gap: 9 (ref 5–15)
BUN: 28 mg/dL — ABNORMAL HIGH (ref 8–23)
CO2: 24 mmol/L (ref 22–32)
Calcium: 9 mg/dL (ref 8.9–10.3)
Chloride: 104 mmol/L (ref 98–111)
Creatinine, Ser: 1.32 mg/dL — ABNORMAL HIGH (ref 0.44–1.00)
GFR, Estimated: 39 mL/min — ABNORMAL LOW (ref >60)
Glucose, Bld: 123 mg/dL — ABNORMAL HIGH (ref 70–99)
Potassium: 4.9 mmol/L (ref 3.5–5.1)
Sodium: 137 mmol/L (ref 135–145)
Total Bilirubin: 0.2 mg/dL (ref 0.0–1.2)
Total Protein: 6.4 g/dL — ABNORMAL LOW (ref 6.5–8.1)

## 2023-03-15 LAB — CBC WITH DIFFERENTIAL/PLATELET
Abs Immature Granulocytes: 0.01 10*3/uL (ref 0.00–0.07)
Basophils Absolute: 0.1 10*3/uL (ref 0.0–0.1)
Basophils Relative: 1 %
Eosinophils Absolute: 0.2 10*3/uL (ref 0.0–0.5)
Eosinophils Relative: 4 %
HCT: 30.2 % — ABNORMAL LOW (ref 36.0–46.0)
Hemoglobin: 9.7 g/dL — ABNORMAL LOW (ref 12.0–15.0)
Immature Granulocytes: 0 %
Lymphocytes Relative: 39 %
Lymphs Abs: 1.7 10*3/uL (ref 0.7–4.0)
MCH: 26.3 pg (ref 26.0–34.0)
MCHC: 32.1 g/dL (ref 30.0–36.0)
MCV: 81.8 fL (ref 80.0–100.0)
Monocytes Absolute: 0.7 10*3/uL (ref 0.1–1.0)
Monocytes Relative: 17 %
Neutro Abs: 1.6 10*3/uL — ABNORMAL LOW (ref 1.7–7.7)
Neutrophils Relative %: 39 %
Platelets: 219 10*3/uL (ref 150–400)
RBC: 3.69 MIL/uL — ABNORMAL LOW (ref 3.87–5.11)
RDW: 16.2 % — ABNORMAL HIGH (ref 11.5–15.5)
WBC: 4.2 10*3/uL (ref 4.0–10.5)
nRBC: 0 % (ref 0.0–0.2)

## 2023-03-15 MED ORDER — FENTANYL CITRATE PF 50 MCG/ML IJ SOSY
50.0000 ug | PREFILLED_SYRINGE | Freq: Once | INTRAMUSCULAR | Status: AC
  Administered 2023-03-15: 50 ug via INTRAVENOUS
  Filled 2023-03-15: qty 1

## 2023-03-15 MED ORDER — MECLIZINE HCL 25 MG PO TABS
12.5000 mg | ORAL_TABLET | Freq: Once | ORAL | Status: AC
  Administered 2023-03-15: 12.5 mg via ORAL
  Filled 2023-03-15: qty 1

## 2023-03-15 MED ORDER — LIDOCAINE 5 % EX PTCH
1.0000 | MEDICATED_PATCH | CUTANEOUS | Status: DC
  Administered 2023-03-15: 1 via TRANSDERMAL
  Filled 2023-03-15: qty 1

## 2023-03-15 MED ORDER — IOHEXOL 350 MG/ML SOLN
75.0000 mL | Freq: Once | INTRAVENOUS | Status: AC | PRN
  Administered 2023-03-15: 75 mL via INTRAVENOUS

## 2023-03-15 MED ORDER — MECLIZINE HCL 25 MG PO TABS
25.0000 mg | ORAL_TABLET | Freq: Three times a day (TID) | ORAL | 0 refills | Status: DC | PRN
Start: 2023-03-15 — End: 2023-03-18

## 2023-03-15 MED ORDER — ONDANSETRON HCL 4 MG/2ML IJ SOLN
4.0000 mg | Freq: Once | INTRAMUSCULAR | Status: AC
  Administered 2023-03-15: 4 mg via INTRAVENOUS
  Filled 2023-03-15: qty 2

## 2023-03-15 NOTE — ED Notes (Signed)
 Pt transported to imaging.

## 2023-03-15 NOTE — Discharge Instructions (Addendum)
 1.  Make an appoint to see your family doctor and your neurologist as soon as possible. 2.  Return to the emergency department immediately if you have any new or worsening symptoms. 3.  You may take Antivert  as prescribed for vertigo if needed.  If this is not helping her symptoms, do not take this medication.  It only helps with symptoms and does not cure the problem. 4.  Take extra strength Tylenol  every 6 hours for pain.  You may apply over-the-counter pain patches to areas of bruising or swelling.  He also apply well wrapped ice pack.

## 2023-03-15 NOTE — ED Notes (Signed)
 Pt unable to ambulate due to pain. EDP aware.

## 2023-03-15 NOTE — ED Provider Notes (Signed)
 On eliquis , several falls, difficulty walking. Getting MRI r/o CVA, if negative, anticipate TOC. Physical Exam  BP (!) 133/59   Pulse (!) 52   Temp 97.8 F (36.6 C) (Oral)   Resp 16   Ht 5' 3 (1.6 m)   Wt 80.3 kg   SpO2 100%   BMI 31.35 kg/m   Physical Exam  Procedures  Procedures  ED Course / MDM    Medical Decision Making Amount and/or Complexity of Data Reviewed Labs: ordered. Radiology: ordered.  Risk Prescription drug management.   MRI shows a small area in the corpus callosum per radiology favored to be subacute stroke.  Patient is alert.  No distress.  She reports that she had dizziness that was a vertigo quality.  She reports is a lot better now and she feels like she can manage at home.  She reports the left-sided weakness started several months ago after a fall.  She reports ever since that time the left leg has been less steady.  She reports more acutely with her recent falls she feels that vertigo is a contributing factor.  She reports she had vertigo a number of years ago and got a lot better with meclizine  so she tried to half tablets from her old meclizine  prescription and found improvement.  Patient has been up and ambulatory at her baseline using her walker in the emergency department.  She reports she feels steady as is normal for her.  Patient wants to go home.  She has maintained an alert mental status and is exhibiting good function.  Consult: Reviewed with Dr. Vanessa.  He has reviewed MRI.  Agreed patient is stable for discharge given time of onset and history.  Okay to continue her Eliquis  on outpatient basis.  Patient is anxious to go home.  We reviewed home management.  She is up and ambulatory.  She feels that she can function adequately at home and has adequate assistance.  States she has been advised to get close follow-up.       Armenta Canning, MD 03/15/23 [phone]

## 2023-03-15 NOTE — Telephone Encounter (Signed)
 Copied from CRM [id number]. Topic: Clinical - Medication Question >> Mar 15, 2023  2:55 PM [name] wrote: Reason for CRM: patient was seen in ED and prescribed  meclizine  (ANTIVERT ) 25 MG tablet  For dizziness,as needed.She wasn't told where to pick up prescription from,which pharmacy.She would like to know if Dr Geofm has to send in another script for her?  [facility] [address] ([address]), [place] - [address]  Phone: [phone] Fax: [phone]

## 2023-03-15 NOTE — ED Provider Notes (Signed)
 [facility] Provider Note   CSN: [id number] Arrival date & time: 03/14/23  1629     History  Chief Complaint  Patient presents with   [place]    KAMEKO HUKILL is a 89 y.o. female.  The history is provided by the patient, a relative and medical records.  Fall  Zelpha Messing Esqueda is a 89 y.o. female who presents to the Emergency Department complaining of fall.  She presents to the emergency department accompanied by her cousin for evaluation of multiple falls.  She had a fall on Tuesday as well as Wednesday.  She states that she got dizzy and fell backwards, hitting her right side on Wednesday.  She laid on the ground for about 30 minutes.  She does not believe she lost consciousness.  No fever, chest pain.  She did have nausea and vomiting on Tuesday.  She has chronic left lower extremity weakness.  Pain is predominantly on her right flank.  She typically lives alone and walks with a walker but her cousin has been staying with her since she started falling.  She does take Eliquis .     Home Medications Prior to Admission medications   Medication Sig Start Date End Date Taking? Authorizing Provider  acetaminophen  (TYLENOL ) 500 MG tablet Take 1,000 mg by mouth daily as needed for moderate pain.    [provider]  Ascorbic Acid (VITAMIN C) 500 MG tablet Take 500 mg by mouth daily.    [provider]  atorvastatin  (LIPITOR) 10 MG tablet TAKE 1 TABLET ONE TIME DAILY AT 6PM 02/06/23   Geofm Glade PARAS, MD  blood glucose meter kit and supplies KIT Dispense based on patient and insurance preference. Use up to four times daily as directed. (FOR E11.9).Dispense based on patient and insurance preference. Use up to four times daily as directed. (FOR E11.9). Patient would like to stick with Accu-check meter if covered 03/16/22   Geofm Glade PARAS, MD  calcium  carbonate (OS-CAL) 600 MG TABS Take 600 mg by mouth daily.    [provider]  Cod Liver Oil 1000 MG CAPS Take 1,000 mg by mouth daily.     [provider]  diclofenac  Sodium (VOLTAREN ) 1 % GEL Apply 2 g topically 4 (four) times daily. 10/01/22   Merlynn Niki FALCON, FNP  DORZOLAMIDE  HCL-TIMOLOL  MAL OP Place 1 drop into the right eye 2 (two) times daily.    [provider]  Dulaglutide  (TRULICITY ) 4.5 MG/0.5ML SOAJ Inject 4.5 mg into the skin once a week. INJECT 4.5MG  (1 PEN) UNDER THE SKIN EVERY WEEK 01/10/23   Burns, Glade PARAS, MD  Elastic Bandages & Supports (LUMBAR BACK BRACE/SUPPORT PAD) MISC UAD when standing long periods of time for chronic back pain 01/17/23   Geofm Glade PARAS, MD  ELIQUIS  5 MG TABS tablet TAKE 1 TABLET TWICE DAILY (APPOINTMENT IS NEEDED FOR MORE REFILLS) 12/03/22   Geofm Glade PARAS, MD  gabapentin  (NEURONTIN ) 300 MG capsule TAKE 1 CAPSULE EVERY DAY 03/11/23   Burns, Glade PARAS, MD  Glucerna (GLUCERNA) LIQD Take 237 mLs by mouth daily. 01/22/20   [provider]  glucose blood (ACCU-CHEK GUIDE) test strip TEST BLOOD SUGAR UP TO FOUR TIMES DAILY 11/20/21   Geofm Glade PARAS, MD  hydrochlorothiazide  (HYDRODIURIL ) 12.5 MG tablet Take 12.5 mg by mouth daily as needed (swelling).    [provider]  insulin  aspart (NOVOLOG  FLEXPEN) 100 UNIT/ML FlexPen USE AS DIRECTED BY YOUR PRESCRIBER. COMPLETE  DIRECTIONS ARE INCLUDED IN A LETTER WITH YOUR ORIGINAL ORDER 09/11/22   Geofm Glade PARAS, MD  Insulin  Pen Needle (BD AUTOSHIELD DUO) 30G X 5 MM MISC USE AS DIRECTED WITH INSULIN  PEN NEEDLE 4 TIMES DAILY 04/03/21   Geofm Glade PARAS, MD  Lancet Devices [facility] PLUS LANCING) MISC USE AS DIRECTED 08/02/22   Geofm Glade PARAS, MD  Lancets [facility] DELICA PLUS [place]) MISC USE AS INSTRUCTED 03/11/23   Geofm Glade PARAS, MD  latanoprost  (XALATAN ) 0.005 % ophthalmic solution Place 1 drop into both eyes at bedtime. 01/11/20   [provider]  lisinopril  (ZESTRIL ) 40 MG tablet TAKE 1 TABLET EVERY DAY 09/20/22   Geofm Glade PARAS, MD   Multiple Vitamin (MULTIVITAMIN) tablet Take 1 tablet by mouth daily. Prn    [provider]  nitroGLYCERIN  (NITROSTAT ) 0.4 MG SL tablet DISSOLVE ONE TABLET UNDER TONGUE AS DIRECTED FOR CHEST PAIN 08/05/13   Rolan Ezra RAMAN, MD  omeprazole  (PRILOSEC) 20 MG capsule TAKE 1 CAPSULE EVERY DAY (STOP PANTOPRAZOLE ) 01/29/22   Burns, Glade PARAS, MD  RHOPRESSA 0.02 % SOLN Place 1 drop into the right eye at bedtime. 02/05/22   [provider]  Turmeric 500 MG CAPS Take 1 capsule by mouth daily in the afternoon.    [provider]      Allergies    Codeine    Review of Systems   Review of Systems  All other systems reviewed and are negative.   Physical Exam Updated Vital Signs BP (!) 133/59   Pulse (!) 52   Temp 97.8 F (36.6 C) (Oral)   Resp 16   Ht 5' 3 (1.6 m)   Wt 80.3 kg   SpO2 100%   BMI 31.35 kg/m  Physical Exam Vitals and nursing note reviewed.  Constitutional:      Appearance: She is well-developed.  HENT:     Head: Normocephalic and atraumatic.  Cardiovascular:     Rate and Rhythm: Regular rhythm. Bradycardia present.     Heart sounds: No murmur heard. Pulmonary:     Effort: Pulmonary effort is normal. No respiratory distress.     Breath sounds: Normal breath sounds.  Abdominal:     Palpations: Abdomen is soft.     Tenderness: There is no abdominal tenderness. There is no guarding or rebound.     Comments: Hematoma and tenderness to palpation over the right flank.  Musculoskeletal:     Comments: Chronic deformity to the left ankle without any local tenderness.  There is mild tenderness over the knees bilaterally with flexion extension intact.  No significant tenderness over the right hip.  Skin:    General: Skin is warm and dry.  Neurological:     Mental Status: She is alert and oriented to person, place, and time.     Comments: No asymmetry of facial movement, global weakness, visual fields grossly intact.   Psychiatric:        Behavior:  Behavior normal.     ED Results / Procedures / Treatments   Labs (all labs ordered are listed, but only abnormal results are displayed) Labs Reviewed  COMPREHENSIVE METABOLIC PANEL - Abnormal; Notable for the following components:      Result Value   Glucose, Bld 123 (*)    BUN 28 (*)    Creatinine, Ser 1.32 (*)    Total Protein 6.4 (*)    Albumin  3.3 (*)    GFR, Estimated 39 (*)    All other components within  normal limits  CBC WITH DIFFERENTIAL/PLATELET - Abnormal; Notable for the following components:   RBC 3.69 (*)    Hemoglobin 9.7 (*)    HCT 30.2 (*)    RDW 16.2 (*)    Neutro Abs 1.6 (*)    All other components within normal limits  I-STAT CHEM 8, ED - Abnormal; Notable for the following components:   BUN 29 (*)    Creatinine, Ser 1.40 (*)    Glucose, Bld 123 (*)    Hemoglobin 10.2 (*)    HCT 30.0 (*)    All other components within normal limits  URINALYSIS, ROUTINE W REFLEX MICROSCOPIC    EKG EKG Interpretation Date/Time:  Thursday March 14 2023 17:37:44 EST Ventricular Rate:  75 PR Interval:  214 QRS Duration:  68 QT Interval:  384 QTC Calculation: 428 R Axis:   14  Text Interpretation: Sinus rhythm with 1st degree A-V block Low voltage QRS Cannot rule out Anterior infarct , age undetermined Abnormal ECG Confirmed by Griselda Norris [phone]) on 03/15/2023 1:34:34 AM  Radiology CT ABDOMEN PELVIS W CONTRAST Result Date: 03/15/2023 CLINICAL DATA:  89 year old female with history of right flank pain and hip pain after a fall. EXAM: CT ABDOMEN AND PELVIS WITH CONTRAST TECHNIQUE: Multidetector CT imaging of the abdomen and pelvis was performed using the standard protocol following bolus administration of intravenous contrast. RADIATION DOSE REDUCTION: This exam was performed according to the departmental dose-optimization program which includes automated exposure control, adjustment of the mA and/or kV according to patient size and/or use of iterative reconstruction  technique. CONTRAST:  75mL OMNIPAQUE  IOHEXOL  350 MG/ML SOLN COMPARISON:  No priors. FINDINGS: Lower chest: Large hiatal hernia. Atherosclerotic calcifications in the descending thoracic aorta as well as the left anterior descending, left circumflex and right coronary arteries. Scattered areas of scarring in the lower lobes of the lungs bilaterally. Hepatobiliary: No evidence of significant acute traumatic injury to the liver. No suspicious cystic or solid hepatic lesions. No intra or extrahepatic biliary ductal dilatation. Multiple calcified gallstones are noted in the gallbladder. Gallbladder is moderately distended. No gallbladder wall thickening, definite pericholecystic fluid or surrounding inflammatory changes. Pancreas: No pancreatic mass. No pancreatic ductal dilatation. No pancreatic or peripancreatic fluid collections or inflammatory changes. Spleen: No evidence of significant acute traumatic injury to the spleen. Spleen is normal in size and appearance. Adrenals/Urinary Tract: No evidence of significant acute traumatic injury to either kidney or adrenal gland. Poorly defined lesion in the interpolar region of the left kidney measuring 1.4 cm (coronal image 67 of series 6) which is intermediate attenuation (32 HU), considered indeterminate. Additional more well-defined low-intermediate attenuation (19-22 HU) lesion in the lower pole of the left kidney, likely a mildly proteinaceous cyst. Right kidney and bilateral adrenal glands are otherwise unremarkable in appearance. No hydroureteronephrosis. Base of the urinary bladder extends approximately 2 cm below the pubococcygeal line at rest, indicative of a cystocele. Urinary bladder is otherwise unremarkable in appearance. Stomach/Bowel: Intra-abdominal portion of the stomach is unremarkable in appearance. No pathologic dilatation of small bowel or colon. A few scattered colonic diverticuli are noted, without definite focal surrounding inflammatory changes to  indicate an acute diverticulitis at this time. The appendix is not confidently identified and may be surgically absent. Regardless, there are no inflammatory changes noted adjacent to the cecum to suggest the presence of an acute appendicitis at this time. Vascular/Lymphatic: Atherosclerosis in the abdominal aorta and pelvic vasculature. No definite lymphadenopathy noted in the abdomen or pelvis. Reproductive: Status post  hysterectomy. Ovaries are not confidently identified may be surgically absent or atrophic. Other: No significant volume of ascites.  No pneumoperitoneum. Musculoskeletal: There are no acute displaced fractures or aggressive appearing lytic or blastic lesions noted in the visualized portions of the skeleton. IMPRESSION: 1. No evidence of significant acute traumatic injury to the abdomen or pelvis. 2. Indeterminate lesion in the interpolar region of the left kidney. This may simply represent a proteinaceous/hemorrhagic cysts, however, follow-up nonemergent outpatient abdominal MRI with and without IV gadolinium should be considered in the near future to provide definitive characterization and exclude neoplasm. 3. Cholelithiasis without evidence of acute cholecystitis. 4. Colonic diverticulosis without evidence of acute diverticulitis at this time. 5. Large hiatal hernia. 6. Cystocele. 7. Aortic atherosclerosis, in addition to at least three-vessel coronary artery disease. Electronically Signed   By: Toribio Aye M.D.   On: 03/15/2023 06:03   CT Head Wo Contrast Result Date: 03/15/2023 CLINICAL DATA:  Minor head trauma EXAM: CT HEAD WITHOUT CONTRAST TECHNIQUE: Contiguous axial images were obtained from the base of the skull through the vertex without intravenous contrast. RADIATION DOSE REDUCTION: This exam was performed according to the departmental dose-optimization program which includes automated exposure control, adjustment of the mA and/or kV according to patient size and/or use of  iterative reconstruction technique. COMPARISON:  03/12/2023 FINDINGS: Brain: No evidence of acute infarction, hemorrhage, hydrocephalus, extra-axial collection or mass lesion/mass effect. 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 Mild for age cerebral volume loss which is generalized. Vascular: No hyperdense vessel or unexpected calcification. Skull: Normal. Negative for fracture or focal lesion. Sinuses/Orbits: No acute finding. IMPRESSION: No acute or interval finding. Electronically Signed   By: Dorn Roulette M.D.   On: 03/15/2023 05:25   DG Hip Unilat W or Wo Pelvis 2-3 Views Right Result Date: 03/14/2023 CLINICAL DATA:  fall EXAM: DG HIP (WITH OR WITHOUT PELVIS) 2-3V RIGHT COMPARISON:  X-ray pelvis 09/23/2022, x-ray left knee 10/01/2022 FINDINGS: There is no evidence of hip fracture or dislocation of the right hip. No acute displaced fracture or dislocation of left hip. No acute displaced fracture or diastasis of the bones of the pelvis. There is no evidence of arthropathy or aggressive appearing focal bone abnormality. Intramedullary calcifications of the distal shaft of the femur partially visualized. Vascular calcifications. Degenerative changes of the lower lumbar spine. IMPRESSION: Negative for acute traumatic injury. Electronically Signed   By: Morgane  Naveau M.D.   On: 03/14/2023 20:30    Procedures Procedures    Medications Ordered in ED Medications  lidocaine  (LIDODERM ) 5 % 1 patch (has no administration in time range)  fentaNYL  (SUBLIMAZE ) injection 50 mcg (50 mcg Intravenous Given 03/15/23 0331)  ondansetron  (ZOFRAN ) injection 4 mg (4 mg Intravenous Given 03/15/23 0331)  iohexol  (OMNIPAQUE ) 350 MG/ML injection 75 mL (75 mLs Intravenous Contrast Given 03/15/23 0513)  fentaNYL  (SUBLIMAZE ) injection 50 mcg (50 mcg Intravenous Given 03/15/23 9342)    ED Course/ Medical Decision Making/ A&P                                 Medical Decision Making Amount and/or  Complexity of Data Reviewed Labs: ordered. Radiology: ordered.  Risk Prescription drug management.   Pt with hx/o HTN, DM here for evaluation of injuries after a fall.  Has been dizzy for the last 2 days.  No focal deficits on examination.  She does have global weakness with tenderness over the  right flank.  Pain does limit her mobility.  Imaging is negative for retroperitoneal hematoma or complication secondary to her fall.  Hemoglobin is at her baseline.  Given her persistent dizziness for several days plan to obtain an MRI to rule out subacute CVA.  Patient care transferred pending MRI, ambulatory trial.        Final Clinical Impression(s) / ED Diagnoses Final diagnoses:  None    Rx / DC Orders ED Discharge Orders     None         Griselda Norris, MD 03/15/23 0700

## 2023-03-17 ENCOUNTER — Telehealth: Payer: Self-pay

## 2023-03-17 NOTE — Telephone Encounter (Signed)
 Patient called in to state she did not get her prescription in the discharge paperwork. She requested  medication be called in to [facility] on [address]. [facility] to [facility] as written.

## 2023-03-18 MED ORDER — MECLIZINE HCL 25 MG PO TABS: 25.0000 mg | ORAL_TABLET | Freq: Three times a day (TID) | ORAL | 0 refills | Status: DC | PRN

## 2023-03-18 NOTE — Telephone Encounter (Signed)
 Rx sent

## 2023-03-22 ENCOUNTER — Inpatient Hospital Stay: Payer: Medicare HMO | Admitting: Internal Medicine

## 2023-03-25 ENCOUNTER — Encounter: Payer: Self-pay | Admitting: Internal Medicine

## 2023-03-25 DIAGNOSIS — I69354 Hemiplegia and hemiparesis following cerebral infarction affecting left non-dominant side: Secondary | ICD-10-CM | POA: Insufficient documentation

## 2023-03-25 DIAGNOSIS — I6930 Unspecified sequelae of cerebral infarction: Secondary | ICD-10-CM | POA: Insufficient documentation

## 2023-03-25 NOTE — Progress Notes (Signed)
 Subjective:    Patient ID: Rebecca [name], female    DOB: Nov 20, 1932, 89 y.o.   MRN: [id number]     HPI Rebecca [name] is here for follow up from the hospital  Went to ED 1/2 after fall.  She fell multiple times within a few days.  She states getting dizzy and falling backwards.   One day she hit her right side.  She laid on the ground for 30 minutes.  She denies LOC.  One day she had nausea and vomiting. Pain is predominantly on her right flank.  She has chronic left lower extremity weakness. (Left lower extremity weakness started several months ago after a fall.)  No focal deficit on exam. On eliquis .  Imaging was negative for retroperitoneal hematoma or complication from fall.  Hgb at baseline.   MRI done - small area in the corpus callosum favoring subacute stroke.  Meclizine  at home did help the dizziness.  She was ambulating in the ED with walker.  Stable for discharge.  Continued on Eliquis .    No falls since being home from ED.    Takes eliquis  after breakfast - 10am and after dinner - 5pm  Weakness in hands - chronic.  Left leg is weak - it moves on its own.    Medications and allergies reviewed with patient and updated if appropriate.  Current Outpatient Medications on File Prior to Visit  Medication Sig Dispense Refill   acetaminophen  (TYLENOL ) 500 MG tablet Take 1,000 mg by mouth daily as needed for moderate pain.     Ascorbic Acid (VITAMIN C) 500 MG tablet Take 500 mg by mouth daily.     atorvastatin  (LIPITOR) 10 MG tablet TAKE 1 TABLET ONE TIME DAILY AT 6PM 90 tablet 3   blood glucose meter kit and supplies KIT Dispense based on patient and insurance preference. Use up to four times daily as directed. (FOR E11.9).Dispense based on patient and insurance preference. Use up to four times daily as directed. (FOR E11.9). Patient would like to stick with Accu-check meter if covered 1 each 0   calcium  carbonate (OS-CAL) 600 MG TABS Take 600 mg by mouth daily.     Cod Liver  Oil 1000 MG CAPS Take 1,000 mg by mouth daily.      diclofenac  Sodium (VOLTAREN ) 1 % GEL Apply 2 g topically 4 (four) times daily. 350 g 0   dorzolamide -timolol  (COSOPT ) 2-0.5 % ophthalmic solution Place 1 drop into the right eye 2 (two) times daily.     Dulaglutide  (TRULICITY ) 4.5 MG/0.5ML SOAJ Inject 4.5 mg into the skin once a week. INJECT 4.5MG  (1 PEN) UNDER THE SKIN EVERY WEEK 2 mL 2   Elastic Bandages & Supports (LUMBAR BACK BRACE/SUPPORT PAD) MISC UAD when standing long periods of time for chronic back pain 1 each 0   ELIQUIS  5 MG TABS tablet TAKE 1 TABLET TWICE DAILY (APPOINTMENT IS NEEDED FOR MORE REFILLS) 180 tablet 3   gabapentin  (NEURONTIN ) 300 MG capsule TAKE 1 CAPSULE EVERY DAY 90 capsule 3   Glucerna (GLUCERNA) LIQD Take 237 mLs by mouth daily.     glucose blood (ACCU-CHEK GUIDE) test strip TEST BLOOD SUGAR UP TO FOUR TIMES DAILY 400 strip 2   hydrochlorothiazide  (HYDRODIURIL ) 12.5 MG tablet Take 12.5 mg by mouth daily as needed (swelling).     insulin  aspart (NOVOLOG  FLEXPEN) 100 UNIT/ML FlexPen USE AS DIRECTED BY YOUR PRESCRIBER. COMPLETE DIRECTIONS ARE INCLUDED IN A LETTER WITH YOUR ORIGINAL ORDER 45 mL  3   Insulin  Pen Needle (BD AUTOSHIELD DUO) 30G X 5 MM MISC USE AS DIRECTED WITH INSULIN  PEN NEEDLE 4 TIMES DAILY 400 each 2   Lancet Devices (ONETOUCH DELICA PLUS LANCING) MISC USE AS DIRECTED 1 each 3   Lancets (ONETOUCH DELICA PLUS LANCET33G) MISC USE AS INSTRUCTED 400 each 3   latanoprost  (XALATAN ) 0.005 % ophthalmic solution Place 1 drop into both eyes at bedtime.     lisinopril  (ZESTRIL ) 40 MG tablet TAKE 1 TABLET EVERY DAY 90 tablet 2   meclizine  (ANTIVERT ) 25 MG tablet Take 1 tablet (25 mg total) by mouth 3 (three) times daily as needed for dizziness. 30 tablet 0   Multiple Vitamin (MULTIVITAMIN) tablet Take 1 tablet by mouth daily. Prn     nitroGLYCERIN  (NITROSTAT ) 0.4 MG SL tablet DISSOLVE ONE TABLET UNDER TONGUE AS DIRECTED FOR CHEST PAIN 25 tablet 5   omeprazole   (PRILOSEC) 20 MG capsule TAKE 1 CAPSULE EVERY DAY (STOP PANTOPRAZOLE ) 90 capsule 10   RHOPRESSA 0.02 % SOLN Place 1 drop into the right eye at bedtime.     Turmeric 500 MG CAPS Take 1 capsule by mouth daily in the afternoon.     No current facility-administered medications on file prior to visit.     Review of Systems  Constitutional:  Negative for fever.  Respiratory:  Negative for cough, shortness of breath and wheezing.   Cardiovascular:  Positive for leg swelling (controlled). Negative for chest pain and palpitations.  Gastrointestinal:  Positive for nausea (once only).  Neurological:  Positive for dizziness (getting better -woozy) and light-headedness (occ). Negative for headaches.       Objective:   Vitals:   03/26/23 1443 03/26/23 1448  BP: (!) 150/70 (!) 148/82  Pulse: 69   Temp: 97.9 F (36.6 C)   SpO2: 99%    BP Readings from Last 3 Encounters:  03/26/23 (!) 148/82  03/15/23 (!) 124/45  03/12/23 (!) 158/54   Wt Readings from Last 3 Encounters:  03/26/23 179 lb (81.2 kg)  03/14/23 177 lb (80.3 kg)  01/17/23 177 lb (80.3 kg)   Body mass index is 31.71 kg/m.    Physical Exam Constitutional:      General: She is not in acute distress.    Appearance: Normal appearance.  HENT:     Head: Normocephalic and atraumatic.  Eyes:     Conjunctiva/sclera: Conjunctivae normal.  Cardiovascular:     Rate and Rhythm: Normal rate and regular rhythm.     Heart sounds: Normal heart sounds.  Pulmonary:     Effort: Pulmonary effort is normal. No respiratory distress.     Breath sounds: Normal breath sounds. No wheezing.  Musculoskeletal:     Cervical back: Neck supple.     Right lower leg: Edema (mild) present.     Left lower leg: Edema (mild) present.  Lymphadenopathy:     Cervical: No cervical adenopathy.  Skin:    General: Skin is warm and dry.     Findings: No rash.  Neurological:     Mental Status: She is alert. Mental status is at baseline.     Motor:  Weakness (mild weakness in LLE compared to right) present.  Psychiatric:        Mood and Affect: Mood normal.        Behavior: Behavior normal.        Lab Results  Component Value Date   WBC 4.2 03/15/2023   HGB 10.2 (L) 03/15/2023   HCT 30.0 (L)  03/15/2023   PLT 219 03/15/2023   GLUCOSE 123 (H) 03/15/2023   CHOL 179 01/17/2023   TRIG 60.0 01/17/2023   HDL 77.90 01/17/2023   LDLCALC 89 01/17/2023   ALT 14 03/15/2023   AST 18 03/15/2023   NA 140 03/15/2023   K 4.9 03/15/2023   CL 106 03/15/2023   CREATININE 1.40 (H) 03/15/2023   BUN 29 (H) 03/15/2023   CO2 24 03/15/2023   TSH 0.79 01/17/2023   INR 1.2 03/12/2023   HGBA1C 6.0 (H) 01/17/2023   MICROALBUR 1.9 10/24/2015   MR Brain Wo Contrast (neuro protocol) CLINICAL DATA:  Neuro deficit, acute, stroke suspected.  Dizziness.  EXAM: MRI HEAD WITHOUT CONTRAST  TECHNIQUE: Multiplanar, multiecho pulse sequences of the brain and surrounding structures were obtained without intravenous contrast.  COMPARISON:  MRI brain 01/03/2020. Head CT 03/15/2023, 03/12/2023, 02/09/2022.  FINDINGS: Brain: Ovoid area of mixed signal intensity on DWI and T2 weighted images in the right aspect of the splenium of the corpus callosum, favored to reflect subacute infarct. No acute hemorrhage or significant mass effect. Background of mild chronic small-vessel disease with old perforator infarcts in the right basal ganglia, right thalamus and bilateral cerebellum. Hydrocephalus or extra-axial collection. No mass or midline shift. No foci of abnormal susceptibility.  Vascular: Normal flow voids.  Skull and upper cervical spine: Moderate upper cervical spondylosis.  Sinuses/Orbits: No acute findings.  Other: None.  IMPRESSION: 1. Ovoid area of mixed signal intensity on DWI and T2 weighted images in the right aspect of the splenium of the corpus callosum, favored to reflect subacute infarct. No acute hemorrhage or significant mass  effect. 2. Background of mild chronic small-vessel disease with old perforator infarcts in the right basal ganglia, right thalamus and bilateral cerebellum.  Electronically Signed   By: Ryan Chess M.D.   On: 03/15/2023 08:16 DG Knee Complete 4 Views Right CLINICAL DATA:  89 year old female with history of trauma from a fall. Bilateral knee pain.  EXAM: RIGHT KNEE - COMPLETE 4+ VIEW  COMPARISON:  No priors.  FINDINGS: Four views of the right knee demonstrate no definite acute displaced fracture, subluxation or dislocation. There is severe joint space narrowing, subchondral sclerosis, subchondral cyst formation and osteophyte formation, most pronounced in the lateral compartment. Irregular areas of sclerosis are noted in the distal femoral diaphysis, favored to reflect an enchondroma. Atherosclerotic calcifications are noted in the visualized vasculature.  IMPRESSION: 1. No acute radiographic abnormality of the right knee. 2. Tricompartmental osteoarthritis, most severe in the lateral compartment. 3. Irregular area of sclerosis in the distal right femoral diaphysis, which may suggest an enchondroma or areas of avascular necrosis.  Electronically Signed   By: Toribio Aye M.D.   On: 03/15/2023 07:45 DG Knee Complete 4 Views Left CLINICAL DATA:  89 year old female with history of trauma from a fall. Bilateral knee pain.  EXAM: LEFT KNEE - COMPLETE 4+ VIEW  COMPARISON:  No priors.  FINDINGS: Four views of the left knee demonstrate no acute displaced fracture, subluxation or dislocation. Irregular areas of sclerosis in the distal femoral diaphysis partially imaged, likely reflecting an enchondroma. Joint space narrowing, subchondral sclerosis, subchondral cyst formation and osteophyte formation is noted in a tricompartmental distribution, most severe in the patellofemoral compartment, compatible with advanced osteoarthritis. Numerous vascular calcifications  are noted.  IMPRESSION: 1. No acute radiographic abnormality of the left knee. 2. Tricompartmental osteoarthritis. 3. Irregular areas of sclerosis in the distal femoral diaphysis, favored to represent an enchondroma.  Electronically Signed  By: Toribio Aye M.D.   On: 03/15/2023 07:43 CT ABDOMEN PELVIS W CONTRAST CLINICAL DATA:  89 year old female with history of right flank pain and hip pain after a fall.  EXAM: CT ABDOMEN AND PELVIS WITH CONTRAST  TECHNIQUE: Multidetector CT imaging of the abdomen and pelvis was performed using the standard protocol following bolus administration of intravenous contrast.  RADIATION DOSE REDUCTION: This exam was performed according to the departmental dose-optimization program which includes automated exposure control, adjustment of the mA and/or kV according to patient size and/or use of iterative reconstruction technique.  CONTRAST:  75mL OMNIPAQUE  IOHEXOL  350 MG/ML SOLN  COMPARISON:  No priors.  FINDINGS: Lower chest: Large hiatal hernia. Atherosclerotic calcifications in the descending thoracic aorta as well as the left anterior descending, left circumflex and right coronary arteries. Scattered areas of scarring in the lower lobes of the lungs bilaterally.  Hepatobiliary: No evidence of significant acute traumatic injury to the liver. No suspicious cystic or solid hepatic lesions. No intra or extrahepatic biliary ductal dilatation. Multiple calcified gallstones are noted in the gallbladder. Gallbladder is moderately distended. No gallbladder wall thickening, definite pericholecystic fluid or surrounding inflammatory changes.  Pancreas: No pancreatic mass. No pancreatic ductal dilatation. No pancreatic or peripancreatic fluid collections or inflammatory changes.  Spleen: No evidence of significant acute traumatic injury to the spleen. Spleen is normal in size and appearance.  Adrenals/Urinary Tract: No evidence of  significant acute traumatic injury to either kidney or adrenal gland. Poorly defined lesion in the interpolar region of the left kidney measuring 1.4 cm (coronal image 67 of series 6) which is intermediate attenuation (32 HU), considered indeterminate. Additional more well-defined low-intermediate attenuation (19-22 HU) lesion in the lower pole of the left kidney, likely a mildly proteinaceous cyst. Right kidney and bilateral adrenal glands are otherwise unremarkable in appearance. No hydroureteronephrosis. Base of the urinary bladder extends approximately 2 cm below the pubococcygeal line at rest, indicative of a cystocele. Urinary bladder is otherwise unremarkable in appearance.  Stomach/Bowel: Intra-abdominal portion of the stomach is unremarkable in appearance. No pathologic dilatation of small bowel or colon. A few scattered colonic diverticuli are noted, without definite focal surrounding inflammatory changes to indicate an acute diverticulitis at this time. The appendix is not confidently identified and may be surgically absent. Regardless, there are no inflammatory changes noted adjacent to the cecum to suggest the presence of an acute appendicitis at this time.  Vascular/Lymphatic: Atherosclerosis in the abdominal aorta and pelvic vasculature. No definite lymphadenopathy noted in the abdomen or pelvis.  Reproductive: Status post hysterectomy. Ovaries are not confidently identified may be surgically absent or atrophic.  Other: No significant volume of ascites.  No pneumoperitoneum.  Musculoskeletal: There are no acute displaced fractures or aggressive appearing lytic or blastic lesions noted in the visualized portions of the skeleton.  IMPRESSION: 1. No evidence of significant acute traumatic injury to the abdomen or pelvis. 2. Indeterminate lesion in the interpolar region of the left kidney. This may simply represent a proteinaceous/hemorrhagic cysts, however,  follow-up nonemergent outpatient abdominal MRI with and without IV gadolinium should be considered in the near future to provide definitive characterization and exclude neoplasm. 3. Cholelithiasis without evidence of acute cholecystitis. 4. Colonic diverticulosis without evidence of acute diverticulitis at this time. 5. Large hiatal hernia. 6. Cystocele. 7. Aortic atherosclerosis, in addition to at least three-vessel coronary artery disease.  Electronically Signed   By: Toribio Aye M.D.   On: 03/15/2023 06:03 CT Head Wo Contrast CLINICAL DATA:  Minor  head trauma  EXAM: CT HEAD WITHOUT CONTRAST  TECHNIQUE: Contiguous axial images were obtained from the base of the skull through the vertex without intravenous contrast.  RADIATION DOSE REDUCTION: This exam was performed according to the departmental dose-optimization program which includes automated exposure control, adjustment of the mA and/or kV according to patient size and/or use of iterative reconstruction technique.  COMPARISON:  03/12/2023  FINDINGS: Brain: No evidence of acute infarction, hemorrhage, hydrocephalus, extra-axial collection or mass lesion/mass effect. 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 Mild for age cerebral volume loss which is generalized.  Vascular: No hyperdense vessel or unexpected calcification.  Skull: Normal. Negative for fracture or focal lesion.  Sinuses/Orbits: No acute finding.  IMPRESSION: No acute or interval finding.  Electronically Signed   By: Dorn Roulette M.D.   On: 03/15/2023 05:25    Assessment & Plan:    See Problem List for Assessment and Plan of chronic medical problems.

## 2023-03-25 NOTE — Patient Instructions (Addendum)
           Medications changes include :   try to take the eliquis  12 hours ago from each other.    take 1/2 of a meclizine  instead of a whole pill.   You can also try over the counter non-drowsy dramamine.      A referral was ordered for neurology and someone will call you to schedule an appointment.    Home PT and OT was ordered.     Return for follow up as scheduled.

## 2023-03-26 ENCOUNTER — Ambulatory Visit (INDEPENDENT_AMBULATORY_CARE_PROVIDER_SITE_OTHER): Payer: Medicare HMO | Admitting: Internal Medicine

## 2023-03-26 VITALS — BP 138/74 | HR 69 | Temp 97.9°F | Ht 63.0 in | Wt 179.0 lb

## 2023-03-26 DIAGNOSIS — R5381 Other malaise: Secondary | ICD-10-CM

## 2023-03-26 DIAGNOSIS — I6930 Unspecified sequelae of cerebral infarction: Secondary | ICD-10-CM

## 2023-03-26 DIAGNOSIS — I480 Paroxysmal atrial fibrillation: Secondary | ICD-10-CM

## 2023-03-26 DIAGNOSIS — G8929 Other chronic pain: Secondary | ICD-10-CM | POA: Diagnosis not present

## 2023-03-26 DIAGNOSIS — M545 Low back pain, unspecified: Secondary | ICD-10-CM

## 2023-03-26 DIAGNOSIS — I10 Essential (primary) hypertension: Secondary | ICD-10-CM | POA: Diagnosis not present

## 2023-03-26 DIAGNOSIS — R269 Unspecified abnormalities of gait and mobility: Secondary | ICD-10-CM

## 2023-03-26 DIAGNOSIS — M25561 Pain in right knee: Secondary | ICD-10-CM

## 2023-03-26 NOTE — Assessment & Plan Note (Signed)
 chronic Gait difficulty related to knee arthritis, feet deformities and chronic lower back pain Uses rollator all the time Referral for home PT, OT

## 2023-03-26 NOTE — Assessment & Plan Note (Signed)
 Chronic Related to OA Contributes to gait difficulty and falls Referral for home PT, OT

## 2023-03-26 NOTE — Assessment & Plan Note (Signed)
 Chronic Blood initially elevated - it did improve Monitor BP at home Continue hydrochlorothiazide 12.5 mg daily as needed, lisinopril 40 mg daily

## 2023-03-26 NOTE — Assessment & Plan Note (Signed)
 Recent MRI showed subacute stroke She is taking her Eliquis , but is taking that at approximately 10 AM and 5 PM-encouraged her to try to space this out to approximately 12 hours per Blood pressure slightly elevated here today-monitor at home Sugars well-controlled Will refer to neurology for further evaluation

## 2023-03-26 NOTE — Assessment & Plan Note (Signed)
 Chronic Likely contributing to falls Would benefit from PT Home PT, OT ordered

## 2023-03-26 NOTE — Assessment & Plan Note (Signed)
 Chronic Has pain when she is standing for long periods-sitting down helps, but makes doing some of her ADLs difficult Referral for home PT, OT

## 2023-03-26 NOTE — Assessment & Plan Note (Signed)
 Chronic Following with cardiology Taking Eliquis twice daily-she is currently taking that at 10 AM and 5 PM approximately-advised trying to space this out more to 12 hours apart

## 2023-03-28 ENCOUNTER — Encounter: Payer: Self-pay | Admitting: Neurology

## 2023-04-01 ENCOUNTER — Other Ambulatory Visit: Payer: Self-pay | Admitting: Internal Medicine

## 2023-04-04 DIAGNOSIS — Z0279 Encounter for issue of other medical certificate: Secondary | ICD-10-CM

## 2023-04-09 ENCOUNTER — Encounter: Payer: Self-pay | Admitting: Neurology

## 2023-04-09 ENCOUNTER — Ambulatory Visit (INDEPENDENT_AMBULATORY_CARE_PROVIDER_SITE_OTHER): Payer: Medicare HMO | Admitting: Neurology

## 2023-04-09 VITALS — BP 134/65 | HR 67 | Ht 63.0 in | Wt 187.0 lb

## 2023-04-09 DIAGNOSIS — M48062 Spinal stenosis, lumbar region with neurogenic claudication: Secondary | ICD-10-CM

## 2023-04-09 DIAGNOSIS — G8220 Paraplegia, unspecified: Secondary | ICD-10-CM | POA: Diagnosis not present

## 2023-04-09 NOTE — Progress Notes (Unsigned)
[facility] Neurology Division Clinic Note - Initial Visit   Date: 04/09/2023   DELAYNIE STETZER MRN: [id number] DOB: 14-Jan-1933   Dear Dr. Lawerance Bach:  Thank you for your kind referral of Rebecca [name] for consultation of left leg weakness. Although her history is well known to you, please allow [place] to reiterate it for the purpose of our medical record. The patient was accompanied to the clinic by self.    Eulogia AKOSUA CONSTANTINE is a 89 y.o. right-handed female with diabetes mellitus, hyperlipidemia, hypertension, CAD, and paroxsymal atrial fibrillation  presenting for evaluation of falls and leg weakness.   IMPRESSION/PLAN: Falls due to progressive bilateral (left > right) leg weakness.   Patient has marked weakness in the legs, which is worse on the left (3/5).  Her gait instability is also contributed by external rotation of the leg (overlapping hip pathology?) and pes planus.  Prior imaging from 2021 shows severe spinal canal stenosis at L2-L3 which is most likely causing her diffuse leg weakness.  I will update her MRI lumbar spine to look for interval change.  I stressed the importance of fall precautions, especially being on eliquis.  She uses a walker, which does not seem to be the safest option.  Unfortunately, she would not be able to use a wheelchair as she lives alone and has weakness in the hands.  I will send referral for home OT to do an assessment for power wheelchair.   Her recent MRI brain showed signal intensity on DWI and T2-weighted images at the right splenium.  This appears to be T2 shine through and less likely stroke, especially as there is signal intensity also present on ADC, which would be expected to be dark with stroke.  Further recommendations pending results.   ------------------------------------------------------------- History of present illness: Patient reports having progressive gait instability for the past few years and has been using a  Landscape architect.    She has been falling more over the last few months and has noticed that her both legs, but especially the left leg is much weaker.  She has some numbness in the legs and feet.  No cramps.  She has chronic low back pain.  MRI lumbar spine from 2021 with severe spinal canal stenosis at L2-3.  She is able to control bowel and bladder although reports some urinary urgency.    She had MRI brain in January 2025 which showed possible subacute stroke involving the right splenium.   Out-side paper records, electronic medical record, and images have been reviewed where available and summarized as:  MRI brain wo contrast 03/15/2023: 1. Ovoid area of mixed signal intensity on DWI and T2 weighted images in the right aspect of the splenium of the corpus callosum, favored to reflect subacute infarct. No acute hemorrhage or significant mass effect. 2. Background of mild chronic small-vessel disease with old perforator infarcts in the right basal ganglia, right thalamus and bilateral cerebellum.  MRI lumbar spine 01/03/2020: 1. Multilevel degenerative changes of the lumbar spine superimposed on a congenitally narrow canal. Findings are most pronounced at the L2-3 level where there is severe canal stenosis and moderate-to-severe right foraminal stenosis. 2. Additional levels of mild-to-moderate canal stenosis, as above. 3. Moderate-to-severe right foraminal stenosis at L3-4. 4. Moderate bilateral foraminal stenosis at L4-5. 5. Moderate distention of the urinary bladder. Correlate for urinary retention.     Lab Results  Component Value Date   HGBA1C 6.0 (H) 01/17/2023   Lab Results  Component Value Date  VITAMINB12 1,375 (H) 02/07/2020   Lab Results  Component Value Date   TSH 0.79 01/17/2023   Lab Results  Component Value Date   ESRSEDRATE 39 (H) 06/01/2019    Past Medical History:  Diagnosis Date   Anemia, unspecified    Ankle pain, left    Arthritis    CAD (coronary  artery disease)    Esophageal reflux    Fibrocystic breast disease    Hiatal hernia    Hypercholesteremia    Hypertension    IDDM (insulin dependent diabetes mellitus)    Type II   Multinodular goiter    Sickle-cell trait (HCC)     Past Surgical History:  Procedure Laterality Date   "FEMALE"  1976   BACK SURGERY  1990   BIOPSY THYROID     Dr.Ellison-Neg    CORONARY ANGIOPLASTY     FOOT SURGERY  2004   RETINAL LASER PROCEDURE       Medications:  Outpatient Encounter Medications as of 04/09/2023  Medication Sig   acetaminophen (TYLENOL) 500 MG tablet Take 1,000 mg by mouth daily as needed for moderate pain.   Ascorbic Acid (VITAMIN C) 500 MG tablet Take 500 mg by mouth daily.   atorvastatin (LIPITOR) 10 MG tablet TAKE 1 TABLET ONE TIME DAILY AT 6PM   blood glucose meter kit and supplies KIT Dispense based on patient and insurance preference. Use up to four times daily as directed. (FOR E11.9).Dispense based on patient and insurance preference. Use up to four times daily as directed. (FOR E11.9). Patient would like to stick with Accu-check meter if covered   calcium carbonate (OS-CAL) 600 MG TABS Take 600 mg by mouth daily.   Cod Liver Oil 1000 MG CAPS Take 1,000 mg by mouth daily.    diclofenac Sodium (VOLTAREN) 1 % GEL Apply 2 g topically 4 (four) times daily.   dorzolamide-timolol (COSOPT) 2-0.5 % ophthalmic solution Place 1 drop into the right eye 2 (two) times daily.   Dulaglutide (TRULICITY) 4.5 MG/0.5ML SOAJ Inject 4.5 mg into the skin once a week. INJECT 4.5MG  (1 PEN) UNDER THE SKIN EVERY WEEK   Elastic Bandages & Supports (LUMBAR BACK BRACE/SUPPORT PAD) MISC UAD when standing long periods of time for chronic back pain   ELIQUIS 5 MG TABS tablet TAKE 1 TABLET TWICE DAILY (APPOINTMENT IS NEEDED FOR MORE REFILLS)   gabapentin (NEURONTIN) 300 MG capsule TAKE 1 CAPSULE EVERY DAY   glucose blood (ACCU-CHEK GUIDE) test strip TEST BLOOD SUGAR UP TO FOUR TIMES DAILY    hydrochlorothiazide (HYDRODIURIL) 12.5 MG tablet Take 12.5 mg by mouth daily as needed (swelling).   insulin aspart (NOVOLOG FLEXPEN) 100 UNIT/ML FlexPen USE AS DIRECTED BY YOUR PRESCRIBER. COMPLETE DIRECTIONS ARE INCLUDED IN A LETTER WITH YOUR ORIGINAL ORDER   Insulin Pen Needle (BD AUTOSHIELD DUO) 30G X 5 MM MISC USE AS DIRECTED WITH INSULIN PEN NEEDLE 4 TIMES DAILY   Lancet Devices (ONETOUCH DELICA PLUS LANCING) MISC USE AS DIRECTED   Lancets (ONETOUCH DELICA PLUS LANCET33G) MISC USE AS INSTRUCTED   latanoprost (XALATAN) 0.005 % ophthalmic solution Place 1 drop into both eyes at bedtime.   lisinopril (ZESTRIL) 40 MG tablet TAKE 1 TABLET EVERY DAY   meclizine (ANTIVERT) 25 MG tablet Take 1 tablet (25 mg total) by mouth 3 (three) times daily as needed for dizziness.   Multiple Vitamin (MULTIVITAMIN) tablet Take 1 tablet by mouth daily. Prn   nitroGLYCERIN (NITROSTAT) 0.4 MG SL tablet DISSOLVE ONE TABLET UNDER TONGUE AS DIRECTED  FOR CHEST PAIN   omeprazole (PRILOSEC) 20 MG capsule TAKE 1 CAPSULE EVERY DAY (STOP PANTOPRAZOLE)   Turmeric 500 MG CAPS Take 1 capsule by mouth daily in the afternoon.   [DISCONTINUED] Glucerna (GLUCERNA) LIQD Take 237 mLs by mouth daily. (Patient not taking: Reported on 04/09/2023)   [DISCONTINUED] RHOPRESSA 0.02 % SOLN Place 1 drop into the right eye at bedtime. (Patient not taking: Reported on 04/09/2023)   No facility-administered encounter medications on file as of 04/09/2023.    Allergies:  Allergies  Allergen Reactions   Codeine     "Makes me drunk"    Family History: Family History  Problem Relation Age of Onset   Hypertension Other    Lung cancer Other    Heart disease Other    Gout Sister    Gout Brother    Thyroid disease Brother    Heart attack Father    Heart attack Mother    Diabetes Mother    Gout Brother     Social History: Social History   Tobacco Use   Smoking status: Never   Smokeless tobacco: Never  Vaping Use   Vaping status:  Never Used  Substance Use Topics   Alcohol use: No   Drug use: No   Social History   Social History Narrative   Widowed   Gets regular exercise   [employer]      Are you right handed or left handed? Right Handed   Are you currently employed ? No    What is your current occupation?   Do you live at home alone? Yes   Who lives with you?    What type of home do you live in: 1 story or 2 story? Lives in a one story home. Son lives right next door to patient.          One son deceased   One son living         Vital Signs:  BP 134/65   Pulse 67   Ht 5\' 3"  (1.6 m)   Wt 187 lb (84.8 kg)   SpO2 100%   BMI 33.13 kg/m    Neurological Exam: MENTAL STATUS including orientation to time, place, person, recent and remote memory, attention span and concentration, language, and fund of knowledge is normal.  Speech is not dysarthric.  CRANIAL NERVES: II:  No visual field defects.     III-IV-VI: Pupils equal round and reactive to light.  Normal conjugate, extra-ocular eye movements in all directions of gaze.  No nystagmus.  No ptosis.   V:  Normal facial sensation.    VII:  Normal facial symmetry and movements.   VIII:  Normal hearing and vestibular function.   IX-X:  Normal palatal movement.   XI:  Normal shoulder shrug and head rotation.   XII:  Normal tongue strength and range of motion, no deviation or fasciculation.  MOTOR:  Bilateral hand atrophy (FDI, ADM, ABP).  No fasciculations or abnormal movements.  No pronator drift. Left leg is externally rotated.  Bilateral pes planus.   Upper Extremity:  Right  Left  Deltoid  5/5   5/5   Biceps  5/5   5/5   Triceps  5/5   5/5   Wrist extensors  5/5   5/5   Wrist flexors  5/5   5/5   Finger extensors  4/5   4/5   Finger flexors  4/5   4/5   Dorsal interossei  4/5  4/5   Abductor pollicis  3/5   4/5   Tone (Ashworth scale)  0  0   Lower Extremity:  Right  Left  Hip flexors  3+/5   3/5   Adductor 3/5  3/5   Abductor 4/5  4/5  Knee flexors  5-/5   5/5   Knee extensors  5-/5   5/5   Dorsiflexors  4/5   4/5   Plantarflexors  4/5   4/5   Toe extensors  4/5   4/5   Toe flexors  4/5   4/5   Tone (Ashworth scale)  0  0   MSRs:                                           Right        Left brachioradialis 2+  2+  biceps 2+  2+  triceps 2+  2+  patellar 3+  3+  ankle jerk 0  0  Hoffman no  no  plantar response down  down   SENSORY:  Absent vibration, temperature and pin prick in the legs.   COORDINATION/GAIT: Normal finger-to- nose-finger.  Intact rapid alternating movements bilaterally.  Gait is assisted with walker and appears very unsteady with dragging of the feet and notable eversion of the left leg  Total time spent reviewing records, interview, history/exam, documentation, and coordination of care on day of encounter:  60 min   Thank you for allowing me to participate in patient's care.  If I can answer any additional questions, I would be pleased to do so.    Sincerely,    Ozie Dimaria K. Allena Katz, DO

## 2023-04-10 ENCOUNTER — Ambulatory Visit (INDEPENDENT_AMBULATORY_CARE_PROVIDER_SITE_OTHER): Payer: Medicare HMO | Admitting: Podiatry

## 2023-04-10 ENCOUNTER — Encounter: Payer: Self-pay | Admitting: Podiatry

## 2023-04-10 DIAGNOSIS — M79675 Pain in left toe(s): Secondary | ICD-10-CM | POA: Diagnosis not present

## 2023-04-10 DIAGNOSIS — B351 Tinea unguium: Secondary | ICD-10-CM | POA: Diagnosis not present

## 2023-04-10 DIAGNOSIS — E1142 Type 2 diabetes mellitus with diabetic polyneuropathy: Secondary | ICD-10-CM

## 2023-04-10 DIAGNOSIS — M79674 Pain in right toe(s): Secondary | ICD-10-CM

## 2023-04-10 NOTE — Progress Notes (Signed)
This patient returns to my office for at risk foot care.  This patient requires this care by a professional since this patient will be at risk due to having diabetes and coagulation defect.  This patient is unable to cut nails herself since the patient cannot reach her nails.These nails are painful walking and wearing shoes.  This patient presents for at risk foot care today.  General Appearance  Alert, conversant and in no acute stress.  Vascular  Dorsalis pedis and posterior tibial  pulses are  weakly palpable  bilaterally.  Capillary return is within normal limits  bilaterally. Temperature is within normal limits  bilaterally.  Neurologic  Senn-Weinstein monofilament wire test within normal limits  bilaterally. Muscle power within normal limits bilaterally.  Nails Thick disfigured discolored nails with subungual debris  from hallux to fifth toes bilaterally. No evidence of bacterial infection or drainage bilaterally.  Orthopedic  No limitations of motion  feet .  No crepitus or effusions noted.  No bony pathology or digital deformities noted.  Skin  normotropic skin with no porokeratosis noted bilaterally.  No signs of infections or ulcers noted.     Onychomycosis  Pain in right toes  Pain in left toes  Consent was obtained for treatment procedures.   Mechanical debridement of nails 1-5  bilaterally performed with a nail nipper.  Filed with dremel without incident.    Return office visit    3 months                  Told patient to return for periodic foot care and evaluation due to potential at risk complications.   Helane Gunther DPM

## 2023-04-17 ENCOUNTER — Other Ambulatory Visit: Payer: Self-pay

## 2023-04-17 DIAGNOSIS — G8220 Paraplegia, unspecified: Secondary | ICD-10-CM

## 2023-04-17 DIAGNOSIS — M48062 Spinal stenosis, lumbar region with neurogenic claudication: Secondary | ICD-10-CM

## 2023-04-28 ENCOUNTER — Other Ambulatory Visit: Payer: Self-pay | Admitting: Internal Medicine

## 2023-04-30 DIAGNOSIS — M48062 Spinal stenosis, lumbar region with neurogenic claudication: Secondary | ICD-10-CM | POA: Diagnosis not present

## 2023-04-30 DIAGNOSIS — I480 Paroxysmal atrial fibrillation: Secondary | ICD-10-CM | POA: Diagnosis not present

## 2023-04-30 DIAGNOSIS — D649 Anemia, unspecified: Secondary | ICD-10-CM | POA: Diagnosis not present

## 2023-04-30 DIAGNOSIS — I2510 Atherosclerotic heart disease of native coronary artery without angina pectoris: Secondary | ICD-10-CM | POA: Diagnosis not present

## 2023-04-30 DIAGNOSIS — Z794 Long term (current) use of insulin: Secondary | ICD-10-CM | POA: Diagnosis not present

## 2023-04-30 DIAGNOSIS — I10 Essential (primary) hypertension: Secondary | ICD-10-CM | POA: Diagnosis not present

## 2023-04-30 DIAGNOSIS — M1990 Unspecified osteoarthritis, unspecified site: Secondary | ICD-10-CM | POA: Diagnosis not present

## 2023-04-30 DIAGNOSIS — E119 Type 2 diabetes mellitus without complications: Secondary | ICD-10-CM | POA: Diagnosis not present

## 2023-04-30 DIAGNOSIS — G8220 Paraplegia, unspecified: Secondary | ICD-10-CM | POA: Diagnosis not present

## 2023-05-03 ENCOUNTER — Telehealth: Payer: Self-pay | Admitting: Neurology

## 2023-05-03 NOTE — Telephone Encounter (Signed)
Kate cld for pt. To have orders completed for Home Health Physical Therapy 1 a week 9 weeks and Social worker Eval

## 2023-05-03 NOTE — Telephone Encounter (Signed)
OK to give orders.

## 2023-05-03 NOTE — Telephone Encounter (Signed)
Verbal orders given to home health per Dr.Patel had to leave on voicemail confidently. To call if any other questions.

## 2023-05-07 ENCOUNTER — Encounter: Payer: Self-pay | Admitting: Podiatry

## 2023-05-07 ENCOUNTER — Ambulatory Visit (INDEPENDENT_AMBULATORY_CARE_PROVIDER_SITE_OTHER): Payer: Medicare HMO | Admitting: Podiatry

## 2023-05-07 DIAGNOSIS — Q666 Other congenital valgus deformities of feet: Secondary | ICD-10-CM

## 2023-05-07 DIAGNOSIS — I739 Peripheral vascular disease, unspecified: Secondary | ICD-10-CM

## 2023-05-07 DIAGNOSIS — E1142 Type 2 diabetes mellitus with diabetic polyneuropathy: Secondary | ICD-10-CM

## 2023-05-07 NOTE — Progress Notes (Signed)
 She presents today after having not seen her for some time with severe pes planus burning and tingling in her feet hammertoes weakness in her legs feet and hands.  Objective: Vital signs are stable alert oriented x 3 severe pes planus bilateral neurologic sensorium is diminished per Semmes Weinstein monofilament deep tendon reflexes are diminished muscle strength 3 out of 5 dorsiflexors plantar flexors inverters and everters.  Subtalar joint and ankle joint osteoarthritic changes limited range of motion.  Cutaneous evaluation of stress of well-hydrated cutis there is no erythema Dem salines drainage or odor mild hammertoe deformities noted bilateral side.  Diminished pulses bilateral  Assessment: Diabetes mellitus diabetic peripheral neuropathy diabetic angiopathy.  Hammertoe deformity severe pes planovalgus.  Plan: Referred her to [facility] for diabetic shoes.

## 2023-05-08 DIAGNOSIS — E119 Type 2 diabetes mellitus without complications: Secondary | ICD-10-CM | POA: Diagnosis not present

## 2023-05-08 DIAGNOSIS — M1990 Unspecified osteoarthritis, unspecified site: Secondary | ICD-10-CM | POA: Diagnosis not present

## 2023-05-08 DIAGNOSIS — D649 Anemia, unspecified: Secondary | ICD-10-CM | POA: Diagnosis not present

## 2023-05-08 DIAGNOSIS — I2510 Atherosclerotic heart disease of native coronary artery without angina pectoris: Secondary | ICD-10-CM | POA: Diagnosis not present

## 2023-05-08 DIAGNOSIS — I480 Paroxysmal atrial fibrillation: Secondary | ICD-10-CM | POA: Diagnosis not present

## 2023-05-08 DIAGNOSIS — Z794 Long term (current) use of insulin: Secondary | ICD-10-CM | POA: Diagnosis not present

## 2023-05-08 DIAGNOSIS — G8220 Paraplegia, unspecified: Secondary | ICD-10-CM | POA: Diagnosis not present

## 2023-05-08 DIAGNOSIS — M48062 Spinal stenosis, lumbar region with neurogenic claudication: Secondary | ICD-10-CM | POA: Diagnosis not present

## 2023-05-08 DIAGNOSIS — I10 Essential (primary) hypertension: Secondary | ICD-10-CM | POA: Diagnosis not present

## 2023-05-09 ENCOUNTER — Encounter: Payer: Self-pay | Admitting: Neurology

## 2023-05-10 DIAGNOSIS — I480 Paroxysmal atrial fibrillation: Secondary | ICD-10-CM | POA: Diagnosis not present

## 2023-05-10 DIAGNOSIS — Z794 Long term (current) use of insulin: Secondary | ICD-10-CM | POA: Diagnosis not present

## 2023-05-10 DIAGNOSIS — M48062 Spinal stenosis, lumbar region with neurogenic claudication: Secondary | ICD-10-CM | POA: Diagnosis not present

## 2023-05-10 DIAGNOSIS — I2510 Atherosclerotic heart disease of native coronary artery without angina pectoris: Secondary | ICD-10-CM | POA: Diagnosis not present

## 2023-05-10 DIAGNOSIS — G8220 Paraplegia, unspecified: Secondary | ICD-10-CM | POA: Diagnosis not present

## 2023-05-10 DIAGNOSIS — E119 Type 2 diabetes mellitus without complications: Secondary | ICD-10-CM | POA: Diagnosis not present

## 2023-05-10 DIAGNOSIS — D649 Anemia, unspecified: Secondary | ICD-10-CM | POA: Diagnosis not present

## 2023-05-10 DIAGNOSIS — M1990 Unspecified osteoarthritis, unspecified site: Secondary | ICD-10-CM | POA: Diagnosis not present

## 2023-05-10 DIAGNOSIS — I10 Essential (primary) hypertension: Secondary | ICD-10-CM | POA: Diagnosis not present

## 2023-05-14 ENCOUNTER — Inpatient Hospital Stay: Admission: RE | Admit: 2023-05-14 | Payer: Medicare HMO | Source: Ambulatory Visit

## 2023-05-15 DIAGNOSIS — D649 Anemia, unspecified: Secondary | ICD-10-CM | POA: Diagnosis not present

## 2023-05-15 DIAGNOSIS — E119 Type 2 diabetes mellitus without complications: Secondary | ICD-10-CM | POA: Diagnosis not present

## 2023-05-15 DIAGNOSIS — Z794 Long term (current) use of insulin: Secondary | ICD-10-CM | POA: Diagnosis not present

## 2023-05-15 DIAGNOSIS — G8220 Paraplegia, unspecified: Secondary | ICD-10-CM | POA: Diagnosis not present

## 2023-05-15 DIAGNOSIS — I480 Paroxysmal atrial fibrillation: Secondary | ICD-10-CM | POA: Diagnosis not present

## 2023-05-15 DIAGNOSIS — I10 Essential (primary) hypertension: Secondary | ICD-10-CM | POA: Diagnosis not present

## 2023-05-15 DIAGNOSIS — M1990 Unspecified osteoarthritis, unspecified site: Secondary | ICD-10-CM | POA: Diagnosis not present

## 2023-05-15 DIAGNOSIS — M48062 Spinal stenosis, lumbar region with neurogenic claudication: Secondary | ICD-10-CM | POA: Diagnosis not present

## 2023-05-15 DIAGNOSIS — I2510 Atherosclerotic heart disease of native coronary artery without angina pectoris: Secondary | ICD-10-CM | POA: Diagnosis not present

## 2023-05-16 DIAGNOSIS — D649 Anemia, unspecified: Secondary | ICD-10-CM | POA: Diagnosis not present

## 2023-05-16 DIAGNOSIS — E119 Type 2 diabetes mellitus without complications: Secondary | ICD-10-CM | POA: Diagnosis not present

## 2023-05-16 DIAGNOSIS — M1990 Unspecified osteoarthritis, unspecified site: Secondary | ICD-10-CM | POA: Diagnosis not present

## 2023-05-16 DIAGNOSIS — M48062 Spinal stenosis, lumbar region with neurogenic claudication: Secondary | ICD-10-CM | POA: Diagnosis not present

## 2023-05-16 DIAGNOSIS — I2510 Atherosclerotic heart disease of native coronary artery without angina pectoris: Secondary | ICD-10-CM | POA: Diagnosis not present

## 2023-05-16 DIAGNOSIS — I480 Paroxysmal atrial fibrillation: Secondary | ICD-10-CM | POA: Diagnosis not present

## 2023-05-16 DIAGNOSIS — G8220 Paraplegia, unspecified: Secondary | ICD-10-CM | POA: Diagnosis not present

## 2023-05-16 DIAGNOSIS — Z794 Long term (current) use of insulin: Secondary | ICD-10-CM | POA: Diagnosis not present

## 2023-05-16 DIAGNOSIS — I10 Essential (primary) hypertension: Secondary | ICD-10-CM | POA: Diagnosis not present

## 2023-05-20 ENCOUNTER — Telehealth: Payer: Self-pay

## 2023-05-20 NOTE — Telephone Encounter (Signed)
 Copied from CRM [id number]. Topic: Clinical - Medication Question >> May 20, 2023  4:00 PM Sonny Dandy B wrote: Reason for CRM: [facility] [id number] called to request clinic note for pt. States they are working on getting the pt a wheel chair, need more clinic notes. Please call back.  Send fax to [phone] Attn [facility]

## 2023-05-21 ENCOUNTER — Encounter: Payer: Self-pay | Admitting: Podiatry

## 2023-05-21 ENCOUNTER — Ambulatory Visit (INDEPENDENT_AMBULATORY_CARE_PROVIDER_SITE_OTHER): Payer: Medicare HMO | Admitting: Podiatry

## 2023-05-21 DIAGNOSIS — B351 Tinea unguium: Secondary | ICD-10-CM

## 2023-05-21 DIAGNOSIS — E119 Type 2 diabetes mellitus without complications: Secondary | ICD-10-CM | POA: Diagnosis not present

## 2023-05-21 DIAGNOSIS — M1990 Unspecified osteoarthritis, unspecified site: Secondary | ICD-10-CM | POA: Diagnosis not present

## 2023-05-21 DIAGNOSIS — I739 Peripheral vascular disease, unspecified: Secondary | ICD-10-CM

## 2023-05-21 DIAGNOSIS — M48062 Spinal stenosis, lumbar region with neurogenic claudication: Secondary | ICD-10-CM | POA: Diagnosis not present

## 2023-05-21 DIAGNOSIS — I480 Paroxysmal atrial fibrillation: Secondary | ICD-10-CM | POA: Diagnosis not present

## 2023-05-21 DIAGNOSIS — E1142 Type 2 diabetes mellitus with diabetic polyneuropathy: Secondary | ICD-10-CM

## 2023-05-21 DIAGNOSIS — I2510 Atherosclerotic heart disease of native coronary artery without angina pectoris: Secondary | ICD-10-CM | POA: Diagnosis not present

## 2023-05-21 DIAGNOSIS — D649 Anemia, unspecified: Secondary | ICD-10-CM | POA: Diagnosis not present

## 2023-05-21 DIAGNOSIS — G8220 Paraplegia, unspecified: Secondary | ICD-10-CM | POA: Diagnosis not present

## 2023-05-21 DIAGNOSIS — I10 Essential (primary) hypertension: Secondary | ICD-10-CM | POA: Diagnosis not present

## 2023-05-21 DIAGNOSIS — Z794 Long term (current) use of insulin: Secondary | ICD-10-CM | POA: Diagnosis not present

## 2023-05-21 DIAGNOSIS — M79674 Pain in right toe(s): Secondary | ICD-10-CM

## 2023-05-21 DIAGNOSIS — M79675 Pain in left toe(s): Secondary | ICD-10-CM

## 2023-05-21 NOTE — Telephone Encounter (Signed)
 21 pages faxed today and conformation received.

## 2023-05-22 DIAGNOSIS — G8220 Paraplegia, unspecified: Secondary | ICD-10-CM | POA: Diagnosis not present

## 2023-05-22 DIAGNOSIS — Z794 Long term (current) use of insulin: Secondary | ICD-10-CM | POA: Diagnosis not present

## 2023-05-22 DIAGNOSIS — M48062 Spinal stenosis, lumbar region with neurogenic claudication: Secondary | ICD-10-CM | POA: Diagnosis not present

## 2023-05-22 DIAGNOSIS — E119 Type 2 diabetes mellitus without complications: Secondary | ICD-10-CM | POA: Diagnosis not present

## 2023-05-22 DIAGNOSIS — I2510 Atherosclerotic heart disease of native coronary artery without angina pectoris: Secondary | ICD-10-CM | POA: Diagnosis not present

## 2023-05-22 DIAGNOSIS — M1990 Unspecified osteoarthritis, unspecified site: Secondary | ICD-10-CM | POA: Diagnosis not present

## 2023-05-22 DIAGNOSIS — I480 Paroxysmal atrial fibrillation: Secondary | ICD-10-CM | POA: Diagnosis not present

## 2023-05-22 DIAGNOSIS — D649 Anemia, unspecified: Secondary | ICD-10-CM | POA: Diagnosis not present

## 2023-05-22 DIAGNOSIS — I10 Essential (primary) hypertension: Secondary | ICD-10-CM | POA: Diagnosis not present

## 2023-05-23 DIAGNOSIS — I2510 Atherosclerotic heart disease of native coronary artery without angina pectoris: Secondary | ICD-10-CM | POA: Diagnosis not present

## 2023-05-23 DIAGNOSIS — G8220 Paraplegia, unspecified: Secondary | ICD-10-CM | POA: Diagnosis not present

## 2023-05-23 DIAGNOSIS — M1990 Unspecified osteoarthritis, unspecified site: Secondary | ICD-10-CM | POA: Diagnosis not present

## 2023-05-23 DIAGNOSIS — I480 Paroxysmal atrial fibrillation: Secondary | ICD-10-CM | POA: Diagnosis not present

## 2023-05-23 DIAGNOSIS — M48062 Spinal stenosis, lumbar region with neurogenic claudication: Secondary | ICD-10-CM | POA: Diagnosis not present

## 2023-05-23 DIAGNOSIS — Z794 Long term (current) use of insulin: Secondary | ICD-10-CM | POA: Diagnosis not present

## 2023-05-23 DIAGNOSIS — E119 Type 2 diabetes mellitus without complications: Secondary | ICD-10-CM | POA: Diagnosis not present

## 2023-05-23 DIAGNOSIS — D649 Anemia, unspecified: Secondary | ICD-10-CM | POA: Diagnosis not present

## 2023-05-23 DIAGNOSIS — I10 Essential (primary) hypertension: Secondary | ICD-10-CM | POA: Diagnosis not present

## 2023-05-26 NOTE — Progress Notes (Signed)
  Subjective:  Patient ID: Rebecca [name], female    DOB: 1932-11-13,  MRN: [id number]  Rebecca [name] presents to clinic today for at risk foot care. Patient has h/o PAD and neuropathy and painful, discolored, thick toenails which interfere with daily activities  Chief Complaint  Patient presents with   [facility]    She is here for diabetic nail trim, pcp is dr burns and seen every 6 months, Last A1C was 7 or 6"   New problem(s): None.   PCP is Pincus Sanes, MD.  Allergies  Allergen Reactions   Codeine     "Makes me drunk"    Review of Systems: Negative except as noted in the HPI.  Objective: No changes noted in today's physical examination. There were no vitals filed for this visit. Rebecca [name] is a pleasant 89 y.o. female in NAD. AAO x 3.  Vascular Examination: CFT <4 seconds b/l LE. Faintly palpable DP pulses b/l LE. Faintly palpable PT pulse(s) b/l LE. Pedal hair absent. No pain with calf compression b/l. +2 pitting edema BLE. No ischemia or gangrene noted b/l LE. No cyanosis or clubbing noted b/l LE.  Dermatological Examination: Pedal skin is warm and supple b/l LE. No open wounds b/l LE. No interdigital macerations noted b/l LE. Toenails 1-5 left, R hallux, R 3rd toe, R 4th toe, and R 5th toe elongated, discolored, dystrophic, thickened, and crumbly with subungual debris and tenderness to dorsal palpation.   Anonychia noted right 2nd toe, right 4th toe. Nailbeds remain intact.  Incurvated nailplate b/l lower extremities with tenderness to palpation. No erythema, no edema, no drainage noted. No fluctuance.   Musculoskeletal Examination: Muscle strength 5/5 to all lower extremity muscle groups bilaterally. Severely collapsed flatfoot of the left lower extremity. Wearing appropriate fitting shoe gear. Utilizes rollator for ambulation assistance.  Neurological Examination: Pt has subjective symptoms of neuropathy. Protective sensation intact 5/5 intact  bilaterally with 10g monofilament b/l. Vibratory sensation diminished b/l.  Assessment/Plan: 1. Pain due to onychomycosis of toenails of both feet   2. PVD (peripheral vascular disease) (HCC)   3. Diabetic peripheral neuropathy associated with type 2 diabetes mellitus [facility])   Consent given for treatment. Patient examined. All patient's and/or POA's questions/concerns addressed on today's visit. As a courtesy, toenails 1-5 debrided in length and girth without incident. Continue foot and shoe inspections daily. Monitor blood glucose per PCP/Endocrinologist's recommendations. Continue soft, supportive shoe gear daily. Report any pedal injuries to medical professional. Call office if there are any questions/concerns. -Patient/POA to call should there be question/concern in the interim.   Return in about 3 months (around 08/21/2023).  Freddie Breech, DPM      [place] LOCATION: [address] [place] [address]                   Office [phone]   [place] LOCATION: [address] [place], [place] [address] Office [phone]

## 2023-05-28 DIAGNOSIS — I480 Paroxysmal atrial fibrillation: Secondary | ICD-10-CM | POA: Diagnosis not present

## 2023-05-28 DIAGNOSIS — M48062 Spinal stenosis, lumbar region with neurogenic claudication: Secondary | ICD-10-CM | POA: Diagnosis not present

## 2023-05-28 DIAGNOSIS — E119 Type 2 diabetes mellitus without complications: Secondary | ICD-10-CM | POA: Diagnosis not present

## 2023-05-28 DIAGNOSIS — G8220 Paraplegia, unspecified: Secondary | ICD-10-CM | POA: Diagnosis not present

## 2023-05-28 DIAGNOSIS — I2510 Atherosclerotic heart disease of native coronary artery without angina pectoris: Secondary | ICD-10-CM | POA: Diagnosis not present

## 2023-05-28 DIAGNOSIS — Z794 Long term (current) use of insulin: Secondary | ICD-10-CM | POA: Diagnosis not present

## 2023-05-28 DIAGNOSIS — M1990 Unspecified osteoarthritis, unspecified site: Secondary | ICD-10-CM | POA: Diagnosis not present

## 2023-05-28 DIAGNOSIS — I10 Essential (primary) hypertension: Secondary | ICD-10-CM | POA: Diagnosis not present

## 2023-05-28 DIAGNOSIS — D649 Anemia, unspecified: Secondary | ICD-10-CM | POA: Diagnosis not present

## 2023-05-30 DIAGNOSIS — E119 Type 2 diabetes mellitus without complications: Secondary | ICD-10-CM | POA: Diagnosis not present

## 2023-05-30 DIAGNOSIS — E113593 Type 2 diabetes mellitus with proliferative diabetic retinopathy without macular edema, bilateral: Secondary | ICD-10-CM | POA: Diagnosis not present

## 2023-05-30 DIAGNOSIS — I2510 Atherosclerotic heart disease of native coronary artery without angina pectoris: Secondary | ICD-10-CM | POA: Diagnosis not present

## 2023-05-30 DIAGNOSIS — H401133 Primary open-angle glaucoma, bilateral, severe stage: Secondary | ICD-10-CM | POA: Diagnosis not present

## 2023-05-30 DIAGNOSIS — Z794 Long term (current) use of insulin: Secondary | ICD-10-CM | POA: Diagnosis not present

## 2023-05-30 DIAGNOSIS — I10 Essential (primary) hypertension: Secondary | ICD-10-CM | POA: Diagnosis not present

## 2023-05-30 DIAGNOSIS — I480 Paroxysmal atrial fibrillation: Secondary | ICD-10-CM | POA: Diagnosis not present

## 2023-05-30 DIAGNOSIS — G8220 Paraplegia, unspecified: Secondary | ICD-10-CM | POA: Diagnosis not present

## 2023-05-30 DIAGNOSIS — D649 Anemia, unspecified: Secondary | ICD-10-CM | POA: Diagnosis not present

## 2023-05-30 DIAGNOSIS — M48062 Spinal stenosis, lumbar region with neurogenic claudication: Secondary | ICD-10-CM | POA: Diagnosis not present

## 2023-05-30 DIAGNOSIS — E1137X3 Type 2 diabetes mellitus with diabetic macular edema, resolved following treatment, bilateral: Secondary | ICD-10-CM | POA: Diagnosis not present

## 2023-05-30 DIAGNOSIS — H35373 Puckering of macula, bilateral: Secondary | ICD-10-CM | POA: Diagnosis not present

## 2023-05-30 DIAGNOSIS — M1990 Unspecified osteoarthritis, unspecified site: Secondary | ICD-10-CM | POA: Diagnosis not present

## 2023-05-30 DIAGNOSIS — H524 Presbyopia: Secondary | ICD-10-CM | POA: Diagnosis not present

## 2023-05-30 LAB — HM DIABETES EYE EXAM

## 2023-06-06 DIAGNOSIS — D649 Anemia, unspecified: Secondary | ICD-10-CM | POA: Diagnosis not present

## 2023-06-06 DIAGNOSIS — M1990 Unspecified osteoarthritis, unspecified site: Secondary | ICD-10-CM | POA: Diagnosis not present

## 2023-06-06 DIAGNOSIS — M48062 Spinal stenosis, lumbar region with neurogenic claudication: Secondary | ICD-10-CM | POA: Diagnosis not present

## 2023-06-06 DIAGNOSIS — I2510 Atherosclerotic heart disease of native coronary artery without angina pectoris: Secondary | ICD-10-CM | POA: Diagnosis not present

## 2023-06-06 DIAGNOSIS — I10 Essential (primary) hypertension: Secondary | ICD-10-CM | POA: Diagnosis not present

## 2023-06-06 DIAGNOSIS — E119 Type 2 diabetes mellitus without complications: Secondary | ICD-10-CM | POA: Diagnosis not present

## 2023-06-06 DIAGNOSIS — G8220 Paraplegia, unspecified: Secondary | ICD-10-CM | POA: Diagnosis not present

## 2023-06-06 DIAGNOSIS — Z794 Long term (current) use of insulin: Secondary | ICD-10-CM | POA: Diagnosis not present

## 2023-06-06 DIAGNOSIS — I480 Paroxysmal atrial fibrillation: Secondary | ICD-10-CM | POA: Diagnosis not present

## 2023-06-07 ENCOUNTER — Telehealth: Payer: Self-pay | Admitting: Neurology

## 2023-06-07 ENCOUNTER — Ambulatory Visit
Admission: RE | Admit: 2023-06-07 | Discharge: 2023-06-07 | Disposition: A | Discharge status: Home / Self Care | Source: Ambulatory Visit | Attending: Neurology | Admitting: Neurology

## 2023-06-07 DIAGNOSIS — G8220 Paraplegia, unspecified: Secondary | ICD-10-CM

## 2023-06-07 DIAGNOSIS — M48062 Spinal stenosis, lumbar region with neurogenic claudication: Secondary | ICD-10-CM | POA: Diagnosis not present

## 2023-06-07 DIAGNOSIS — M47816 Spondylosis without myelopathy or radiculopathy, lumbar region: Secondary | ICD-10-CM | POA: Diagnosis not present

## 2023-06-07 DIAGNOSIS — M4804 Spinal stenosis, thoracic region: Secondary | ICD-10-CM | POA: Diagnosis not present

## 2023-06-07 NOTE — Telephone Encounter (Signed)
 I have re faxed 2 more times. All dates on forms are 06/05/23.

## 2023-06-07 NOTE — Telephone Encounter (Signed)
 [facility] speak to nurse as they are missing the evaluation to agree because the Pt. Is willing and able to use a power wheel chair. This has to be signed and dated (06/05/23) on that sheet. The date must match the other forms. [phone]

## 2023-06-10 ENCOUNTER — Other Ambulatory Visit: Payer: Self-pay

## 2023-06-11 DIAGNOSIS — I480 Paroxysmal atrial fibrillation: Secondary | ICD-10-CM | POA: Diagnosis not present

## 2023-06-11 DIAGNOSIS — E119 Type 2 diabetes mellitus without complications: Secondary | ICD-10-CM | POA: Diagnosis not present

## 2023-06-11 DIAGNOSIS — G8220 Paraplegia, unspecified: Secondary | ICD-10-CM | POA: Diagnosis not present

## 2023-06-11 DIAGNOSIS — I10 Essential (primary) hypertension: Secondary | ICD-10-CM | POA: Diagnosis not present

## 2023-06-11 DIAGNOSIS — M1990 Unspecified osteoarthritis, unspecified site: Secondary | ICD-10-CM | POA: Diagnosis not present

## 2023-06-11 DIAGNOSIS — M48062 Spinal stenosis, lumbar region with neurogenic claudication: Secondary | ICD-10-CM | POA: Diagnosis not present

## 2023-06-11 DIAGNOSIS — I2510 Atherosclerotic heart disease of native coronary artery without angina pectoris: Secondary | ICD-10-CM | POA: Diagnosis not present

## 2023-06-11 DIAGNOSIS — Z794 Long term (current) use of insulin: Secondary | ICD-10-CM | POA: Diagnosis not present

## 2023-06-11 DIAGNOSIS — D649 Anemia, unspecified: Secondary | ICD-10-CM | POA: Diagnosis not present

## 2023-06-12 ENCOUNTER — Ambulatory Visit: Payer: Medicare HMO

## 2023-06-12 DIAGNOSIS — L84 Corns and callosities: Secondary | ICD-10-CM

## 2023-06-12 DIAGNOSIS — E1142 Type 2 diabetes mellitus with diabetic polyneuropathy: Secondary | ICD-10-CM

## 2023-06-12 DIAGNOSIS — I739 Peripheral vascular disease, unspecified: Secondary | ICD-10-CM

## 2023-06-12 DIAGNOSIS — M2141 Flat foot [pes planus] (acquired), right foot: Secondary | ICD-10-CM

## 2023-06-12 NOTE — Progress Notes (Signed)
 Patient presents to the office today for diabetic shoe and insole measuring.  Patient was measured with brannock device to determine size and width for 1 pair of extra depth shoes and foam casted for 3 pair of insoles.   Documentation of medical necessity will be sent to patient's treating diabetic doctor to verify and sign.   Patient's diabetic provider: Cheryll Cockayne  Shoes and insoles will be ordered at that time and patient will be notified for an appointment for fitting when they arrive.   Shoe size (per patient): 12 Shoe choice:   12WD Shoe size ordered: X801W/blk tie up  Scans being held before sending to [facility] / wtg on documents to be recvd by treating DR  Addison Bailey CPed, CFo, CFm

## 2023-06-13 DIAGNOSIS — E119 Type 2 diabetes mellitus without complications: Secondary | ICD-10-CM | POA: Diagnosis not present

## 2023-06-13 DIAGNOSIS — M1990 Unspecified osteoarthritis, unspecified site: Secondary | ICD-10-CM | POA: Diagnosis not present

## 2023-06-13 DIAGNOSIS — I10 Essential (primary) hypertension: Secondary | ICD-10-CM | POA: Diagnosis not present

## 2023-06-13 DIAGNOSIS — I2510 Atherosclerotic heart disease of native coronary artery without angina pectoris: Secondary | ICD-10-CM | POA: Diagnosis not present

## 2023-06-13 DIAGNOSIS — Z794 Long term (current) use of insulin: Secondary | ICD-10-CM | POA: Diagnosis not present

## 2023-06-13 DIAGNOSIS — I480 Paroxysmal atrial fibrillation: Secondary | ICD-10-CM | POA: Diagnosis not present

## 2023-06-13 DIAGNOSIS — D649 Anemia, unspecified: Secondary | ICD-10-CM | POA: Diagnosis not present

## 2023-06-13 DIAGNOSIS — G8220 Paraplegia, unspecified: Secondary | ICD-10-CM | POA: Diagnosis not present

## 2023-06-13 DIAGNOSIS — M48062 Spinal stenosis, lumbar region with neurogenic claudication: Secondary | ICD-10-CM | POA: Diagnosis not present

## 2023-06-17 ENCOUNTER — Encounter (INDEPENDENT_AMBULATORY_CARE_PROVIDER_SITE_OTHER): Payer: Medicare HMO | Admitting: Ophthalmology

## 2023-06-18 ENCOUNTER — Ambulatory Visit: Payer: Self-pay

## 2023-06-18 DIAGNOSIS — I480 Paroxysmal atrial fibrillation: Secondary | ICD-10-CM | POA: Diagnosis not present

## 2023-06-18 DIAGNOSIS — I10 Essential (primary) hypertension: Secondary | ICD-10-CM | POA: Diagnosis not present

## 2023-06-18 DIAGNOSIS — G8220 Paraplegia, unspecified: Secondary | ICD-10-CM | POA: Diagnosis not present

## 2023-06-18 DIAGNOSIS — M48062 Spinal stenosis, lumbar region with neurogenic claudication: Secondary | ICD-10-CM | POA: Diagnosis not present

## 2023-06-18 DIAGNOSIS — D649 Anemia, unspecified: Secondary | ICD-10-CM | POA: Diagnosis not present

## 2023-06-18 DIAGNOSIS — E119 Type 2 diabetes mellitus without complications: Secondary | ICD-10-CM | POA: Diagnosis not present

## 2023-06-18 DIAGNOSIS — Z794 Long term (current) use of insulin: Secondary | ICD-10-CM | POA: Diagnosis not present

## 2023-06-18 DIAGNOSIS — I2510 Atherosclerotic heart disease of native coronary artery without angina pectoris: Secondary | ICD-10-CM | POA: Diagnosis not present

## 2023-06-18 DIAGNOSIS — M1990 Unspecified osteoarthritis, unspecified site: Secondary | ICD-10-CM | POA: Diagnosis not present

## 2023-06-18 NOTE — Telephone Encounter (Signed)
  Chief Complaint: low bp reading  Symptoms: weak and shaky   Disposition: [] ED /[x] Urgent Care (no appt availability in office) / [] Appointment(In office/virtual)/ []  [place] [facility]/ [] Home Care/ [x] Refused Recommended Disposition /[] [facility]/ []  Follow-up with PCP Additional Notes: Pt called with low bp reading of 95/50. Pt has just finished PT and the therapist took pressure. Pt states she feels weak and shaky. Pt sated her BP readings have been low all week. Pt stated she hasn't had to take BP medication due to how low readings have been. Pt states she hasn't eaten in several hours nor had any water.  Pt was advised to increase water intake by therapist. Pt advised pt go to UC to be evaluated but pt refused at this time. Pt stated "I will go drink lots of water and eat some peanut butter and crackers. If I don't feel better after that, I will go to UC." RN advised pt to call back tomorrow and make follow up with PCP. Pt verbalized understanding.            Copied from CRM [id number]. Topic: Clinical - Red Word Triage >> Jun 18, 2023  4:47 PM Geneva B wrote: [facility] that prompted transfer to Nurse Triage: patient blood pressure is 95/50 Reason for Disposition  [1] Systolic BP 90-110 AND [2] taking blood pressure medications AND [3] dizzy, lightheaded or weak  Answer Assessment - Initial Assessment Questions 1. BLOOD PRESSURE: "What is the blood pressure?" "Did you take at least two measurements 5 minutes apart?"     95/50 2. ONSET: "When did you take your blood pressure?"     This afternoon  3. HOW: "How did you obtain the blood pressure?" (e.g., visiting nurse, automatic home BP monitor)     Automatic  4. HISTORY: "Do you have a history of low blood pressure?" "What is your blood pressure normally?"     Occasionally  5. MEDICINES: "Are you taking any medications for blood pressure?" If Yes, ask: "Have they been changed recently?"     no 6. PULSE RATE: "Do you  know what your pulse rate is?"      N/a 7. OTHER SYMPTOMS: "Have you been sick recently?" "Have you had a recent injury?"     Weakness and shaky  Protocols used: Blood Pressure - Low-A-AH

## 2023-06-20 DIAGNOSIS — D649 Anemia, unspecified: Secondary | ICD-10-CM | POA: Diagnosis not present

## 2023-06-20 DIAGNOSIS — G8220 Paraplegia, unspecified: Secondary | ICD-10-CM | POA: Diagnosis not present

## 2023-06-20 DIAGNOSIS — M1990 Unspecified osteoarthritis, unspecified site: Secondary | ICD-10-CM | POA: Diagnosis not present

## 2023-06-20 DIAGNOSIS — I2510 Atherosclerotic heart disease of native coronary artery without angina pectoris: Secondary | ICD-10-CM | POA: Diagnosis not present

## 2023-06-20 DIAGNOSIS — I480 Paroxysmal atrial fibrillation: Secondary | ICD-10-CM | POA: Diagnosis not present

## 2023-06-20 DIAGNOSIS — M48062 Spinal stenosis, lumbar region with neurogenic claudication: Secondary | ICD-10-CM | POA: Diagnosis not present

## 2023-06-20 DIAGNOSIS — Z794 Long term (current) use of insulin: Secondary | ICD-10-CM | POA: Diagnosis not present

## 2023-06-20 DIAGNOSIS — E119 Type 2 diabetes mellitus without complications: Secondary | ICD-10-CM | POA: Diagnosis not present

## 2023-06-20 DIAGNOSIS — I10 Essential (primary) hypertension: Secondary | ICD-10-CM | POA: Diagnosis not present

## 2023-06-24 DIAGNOSIS — I10 Essential (primary) hypertension: Secondary | ICD-10-CM | POA: Diagnosis not present

## 2023-06-24 DIAGNOSIS — I480 Paroxysmal atrial fibrillation: Secondary | ICD-10-CM | POA: Diagnosis not present

## 2023-06-24 DIAGNOSIS — D649 Anemia, unspecified: Secondary | ICD-10-CM | POA: Diagnosis not present

## 2023-06-24 DIAGNOSIS — M48062 Spinal stenosis, lumbar region with neurogenic claudication: Secondary | ICD-10-CM | POA: Diagnosis not present

## 2023-06-24 DIAGNOSIS — Z794 Long term (current) use of insulin: Secondary | ICD-10-CM | POA: Diagnosis not present

## 2023-06-24 DIAGNOSIS — M1990 Unspecified osteoarthritis, unspecified site: Secondary | ICD-10-CM | POA: Diagnosis not present

## 2023-06-24 DIAGNOSIS — G8220 Paraplegia, unspecified: Secondary | ICD-10-CM | POA: Diagnosis not present

## 2023-06-24 DIAGNOSIS — E119 Type 2 diabetes mellitus without complications: Secondary | ICD-10-CM | POA: Diagnosis not present

## 2023-06-24 DIAGNOSIS — I2510 Atherosclerotic heart disease of native coronary artery without angina pectoris: Secondary | ICD-10-CM | POA: Diagnosis not present

## 2023-06-27 ENCOUNTER — Telehealth: Payer: Self-pay

## 2023-06-27 NOTE — Telephone Encounter (Signed)
OK to give orders.

## 2023-06-27 NOTE — Telephone Encounter (Signed)
 Received a call from Marita Sidle needing verbal orders for PT once a week for 9 weeks.  Call back number: [phone]

## 2023-06-27 NOTE — Telephone Encounter (Signed)
 Called [name] and gave her verbal orders. Rebecca [name] stated she may not be able to move forward with this as there is an appeal in process. She will take orders anyway

## 2023-06-28 DIAGNOSIS — M48062 Spinal stenosis, lumbar region with neurogenic claudication: Secondary | ICD-10-CM | POA: Diagnosis not present

## 2023-06-28 DIAGNOSIS — I10 Essential (primary) hypertension: Secondary | ICD-10-CM | POA: Diagnosis not present

## 2023-06-28 DIAGNOSIS — G8220 Paraplegia, unspecified: Secondary | ICD-10-CM | POA: Diagnosis not present

## 2023-06-28 DIAGNOSIS — E119 Type 2 diabetes mellitus without complications: Secondary | ICD-10-CM | POA: Diagnosis not present

## 2023-06-28 DIAGNOSIS — I2510 Atherosclerotic heart disease of native coronary artery without angina pectoris: Secondary | ICD-10-CM | POA: Diagnosis not present

## 2023-06-28 DIAGNOSIS — M1990 Unspecified osteoarthritis, unspecified site: Secondary | ICD-10-CM | POA: Diagnosis not present

## 2023-06-28 DIAGNOSIS — D649 Anemia, unspecified: Secondary | ICD-10-CM | POA: Diagnosis not present

## 2023-06-28 DIAGNOSIS — Z794 Long term (current) use of insulin: Secondary | ICD-10-CM | POA: Diagnosis not present

## 2023-06-28 DIAGNOSIS — I480 Paroxysmal atrial fibrillation: Secondary | ICD-10-CM | POA: Diagnosis not present

## 2023-06-29 DIAGNOSIS — G8220 Paraplegia, unspecified: Secondary | ICD-10-CM | POA: Diagnosis not present

## 2023-06-29 DIAGNOSIS — M48062 Spinal stenosis, lumbar region with neurogenic claudication: Secondary | ICD-10-CM | POA: Diagnosis not present

## 2023-06-29 DIAGNOSIS — Z794 Long term (current) use of insulin: Secondary | ICD-10-CM | POA: Diagnosis not present

## 2023-06-29 DIAGNOSIS — I10 Essential (primary) hypertension: Secondary | ICD-10-CM | POA: Diagnosis not present

## 2023-06-29 DIAGNOSIS — D649 Anemia, unspecified: Secondary | ICD-10-CM | POA: Diagnosis not present

## 2023-06-29 DIAGNOSIS — I2510 Atherosclerotic heart disease of native coronary artery without angina pectoris: Secondary | ICD-10-CM | POA: Diagnosis not present

## 2023-06-29 DIAGNOSIS — M1990 Unspecified osteoarthritis, unspecified site: Secondary | ICD-10-CM | POA: Diagnosis not present

## 2023-06-29 DIAGNOSIS — I480 Paroxysmal atrial fibrillation: Secondary | ICD-10-CM | POA: Diagnosis not present

## 2023-06-29 DIAGNOSIS — E119 Type 2 diabetes mellitus without complications: Secondary | ICD-10-CM | POA: Diagnosis not present

## 2023-07-01 DIAGNOSIS — M1990 Unspecified osteoarthritis, unspecified site: Secondary | ICD-10-CM | POA: Diagnosis not present

## 2023-07-01 DIAGNOSIS — M48062 Spinal stenosis, lumbar region with neurogenic claudication: Secondary | ICD-10-CM | POA: Diagnosis not present

## 2023-07-01 DIAGNOSIS — Z794 Long term (current) use of insulin: Secondary | ICD-10-CM | POA: Diagnosis not present

## 2023-07-01 DIAGNOSIS — E119 Type 2 diabetes mellitus without complications: Secondary | ICD-10-CM | POA: Diagnosis not present

## 2023-07-01 DIAGNOSIS — G8220 Paraplegia, unspecified: Secondary | ICD-10-CM | POA: Diagnosis not present

## 2023-07-01 DIAGNOSIS — D649 Anemia, unspecified: Secondary | ICD-10-CM | POA: Diagnosis not present

## 2023-07-01 DIAGNOSIS — I10 Essential (primary) hypertension: Secondary | ICD-10-CM | POA: Diagnosis not present

## 2023-07-01 DIAGNOSIS — I480 Paroxysmal atrial fibrillation: Secondary | ICD-10-CM | POA: Diagnosis not present

## 2023-07-01 DIAGNOSIS — I2510 Atherosclerotic heart disease of native coronary artery without angina pectoris: Secondary | ICD-10-CM | POA: Diagnosis not present

## 2023-07-03 DIAGNOSIS — G8220 Paraplegia, unspecified: Secondary | ICD-10-CM | POA: Diagnosis not present

## 2023-07-03 DIAGNOSIS — M48062 Spinal stenosis, lumbar region with neurogenic claudication: Secondary | ICD-10-CM | POA: Diagnosis not present

## 2023-07-08 DIAGNOSIS — H401133 Primary open-angle glaucoma, bilateral, severe stage: Secondary | ICD-10-CM | POA: Diagnosis not present

## 2023-07-10 DIAGNOSIS — E119 Type 2 diabetes mellitus without complications: Secondary | ICD-10-CM | POA: Diagnosis not present

## 2023-07-10 DIAGNOSIS — I10 Essential (primary) hypertension: Secondary | ICD-10-CM | POA: Diagnosis not present

## 2023-07-10 DIAGNOSIS — I480 Paroxysmal atrial fibrillation: Secondary | ICD-10-CM | POA: Diagnosis not present

## 2023-07-10 DIAGNOSIS — M48062 Spinal stenosis, lumbar region with neurogenic claudication: Secondary | ICD-10-CM | POA: Diagnosis not present

## 2023-07-10 DIAGNOSIS — G8220 Paraplegia, unspecified: Secondary | ICD-10-CM | POA: Diagnosis not present

## 2023-07-10 DIAGNOSIS — Z794 Long term (current) use of insulin: Secondary | ICD-10-CM | POA: Diagnosis not present

## 2023-07-10 DIAGNOSIS — M1990 Unspecified osteoarthritis, unspecified site: Secondary | ICD-10-CM | POA: Diagnosis not present

## 2023-07-10 DIAGNOSIS — D649 Anemia, unspecified: Secondary | ICD-10-CM | POA: Diagnosis not present

## 2023-07-10 DIAGNOSIS — I2510 Atherosclerotic heart disease of native coronary artery without angina pectoris: Secondary | ICD-10-CM | POA: Diagnosis not present

## 2023-07-12 DIAGNOSIS — I480 Paroxysmal atrial fibrillation: Secondary | ICD-10-CM | POA: Diagnosis not present

## 2023-07-12 DIAGNOSIS — Z794 Long term (current) use of insulin: Secondary | ICD-10-CM | POA: Diagnosis not present

## 2023-07-12 DIAGNOSIS — I2510 Atherosclerotic heart disease of native coronary artery without angina pectoris: Secondary | ICD-10-CM | POA: Diagnosis not present

## 2023-07-12 DIAGNOSIS — E119 Type 2 diabetes mellitus without complications: Secondary | ICD-10-CM | POA: Diagnosis not present

## 2023-07-12 DIAGNOSIS — I10 Essential (primary) hypertension: Secondary | ICD-10-CM | POA: Diagnosis not present

## 2023-07-12 DIAGNOSIS — M1990 Unspecified osteoarthritis, unspecified site: Secondary | ICD-10-CM | POA: Diagnosis not present

## 2023-07-12 DIAGNOSIS — M48062 Spinal stenosis, lumbar region with neurogenic claudication: Secondary | ICD-10-CM | POA: Diagnosis not present

## 2023-07-12 DIAGNOSIS — D649 Anemia, unspecified: Secondary | ICD-10-CM | POA: Diagnosis not present

## 2023-07-12 DIAGNOSIS — G8220 Paraplegia, unspecified: Secondary | ICD-10-CM | POA: Diagnosis not present

## 2023-07-17 ENCOUNTER — Ambulatory Visit: Payer: Medicare HMO | Admitting: Internal Medicine

## 2023-07-17 DIAGNOSIS — G8220 Paraplegia, unspecified: Secondary | ICD-10-CM | POA: Diagnosis not present

## 2023-07-17 DIAGNOSIS — E119 Type 2 diabetes mellitus without complications: Secondary | ICD-10-CM | POA: Diagnosis not present

## 2023-07-17 DIAGNOSIS — I2510 Atherosclerotic heart disease of native coronary artery without angina pectoris: Secondary | ICD-10-CM | POA: Diagnosis not present

## 2023-07-17 DIAGNOSIS — M48062 Spinal stenosis, lumbar region with neurogenic claudication: Secondary | ICD-10-CM | POA: Diagnosis not present

## 2023-07-17 DIAGNOSIS — M1990 Unspecified osteoarthritis, unspecified site: Secondary | ICD-10-CM | POA: Diagnosis not present

## 2023-07-17 DIAGNOSIS — I10 Essential (primary) hypertension: Secondary | ICD-10-CM | POA: Diagnosis not present

## 2023-07-17 DIAGNOSIS — D649 Anemia, unspecified: Secondary | ICD-10-CM | POA: Diagnosis not present

## 2023-07-17 DIAGNOSIS — Z794 Long term (current) use of insulin: Secondary | ICD-10-CM | POA: Diagnosis not present

## 2023-07-17 DIAGNOSIS — I480 Paroxysmal atrial fibrillation: Secondary | ICD-10-CM | POA: Diagnosis not present

## 2023-07-18 DIAGNOSIS — I480 Paroxysmal atrial fibrillation: Secondary | ICD-10-CM | POA: Diagnosis not present

## 2023-07-18 DIAGNOSIS — M1990 Unspecified osteoarthritis, unspecified site: Secondary | ICD-10-CM | POA: Diagnosis not present

## 2023-07-18 DIAGNOSIS — G8220 Paraplegia, unspecified: Secondary | ICD-10-CM | POA: Diagnosis not present

## 2023-07-18 DIAGNOSIS — E119 Type 2 diabetes mellitus without complications: Secondary | ICD-10-CM | POA: Diagnosis not present

## 2023-07-18 DIAGNOSIS — I2510 Atherosclerotic heart disease of native coronary artery without angina pectoris: Secondary | ICD-10-CM | POA: Diagnosis not present

## 2023-07-18 DIAGNOSIS — D649 Anemia, unspecified: Secondary | ICD-10-CM | POA: Diagnosis not present

## 2023-07-18 DIAGNOSIS — I10 Essential (primary) hypertension: Secondary | ICD-10-CM | POA: Diagnosis not present

## 2023-07-18 DIAGNOSIS — Z794 Long term (current) use of insulin: Secondary | ICD-10-CM | POA: Diagnosis not present

## 2023-07-18 DIAGNOSIS — M48062 Spinal stenosis, lumbar region with neurogenic claudication: Secondary | ICD-10-CM | POA: Diagnosis not present

## 2023-07-23 DIAGNOSIS — D649 Anemia, unspecified: Secondary | ICD-10-CM | POA: Diagnosis not present

## 2023-07-23 DIAGNOSIS — M1990 Unspecified osteoarthritis, unspecified site: Secondary | ICD-10-CM | POA: Diagnosis not present

## 2023-07-23 DIAGNOSIS — E119 Type 2 diabetes mellitus without complications: Secondary | ICD-10-CM | POA: Diagnosis not present

## 2023-07-23 DIAGNOSIS — Z794 Long term (current) use of insulin: Secondary | ICD-10-CM | POA: Diagnosis not present

## 2023-07-23 DIAGNOSIS — I10 Essential (primary) hypertension: Secondary | ICD-10-CM | POA: Diagnosis not present

## 2023-07-23 DIAGNOSIS — M48062 Spinal stenosis, lumbar region with neurogenic claudication: Secondary | ICD-10-CM | POA: Diagnosis not present

## 2023-07-23 DIAGNOSIS — I2510 Atherosclerotic heart disease of native coronary artery without angina pectoris: Secondary | ICD-10-CM | POA: Diagnosis not present

## 2023-07-23 DIAGNOSIS — I480 Paroxysmal atrial fibrillation: Secondary | ICD-10-CM | POA: Diagnosis not present

## 2023-07-23 DIAGNOSIS — G8220 Paraplegia, unspecified: Secondary | ICD-10-CM | POA: Diagnosis not present

## 2023-07-24 DIAGNOSIS — I2510 Atherosclerotic heart disease of native coronary artery without angina pectoris: Secondary | ICD-10-CM | POA: Diagnosis not present

## 2023-07-24 DIAGNOSIS — E119 Type 2 diabetes mellitus without complications: Secondary | ICD-10-CM | POA: Diagnosis not present

## 2023-07-24 DIAGNOSIS — M1990 Unspecified osteoarthritis, unspecified site: Secondary | ICD-10-CM | POA: Diagnosis not present

## 2023-07-24 DIAGNOSIS — I10 Essential (primary) hypertension: Secondary | ICD-10-CM | POA: Diagnosis not present

## 2023-07-24 DIAGNOSIS — Z794 Long term (current) use of insulin: Secondary | ICD-10-CM | POA: Diagnosis not present

## 2023-07-24 DIAGNOSIS — I480 Paroxysmal atrial fibrillation: Secondary | ICD-10-CM | POA: Diagnosis not present

## 2023-07-24 DIAGNOSIS — D649 Anemia, unspecified: Secondary | ICD-10-CM | POA: Diagnosis not present

## 2023-07-24 DIAGNOSIS — G8220 Paraplegia, unspecified: Secondary | ICD-10-CM | POA: Diagnosis not present

## 2023-07-24 DIAGNOSIS — M48062 Spinal stenosis, lumbar region with neurogenic claudication: Secondary | ICD-10-CM | POA: Diagnosis not present

## 2023-07-25 ENCOUNTER — Encounter: Payer: Self-pay | Admitting: Internal Medicine

## 2023-07-25 NOTE — Patient Instructions (Addendum)
      Blood work was ordered.       Medications changes include :   decrease lisinopril  20 mg daily      Return in about 6 months (around 01/26/2024) for Physical Exam.

## 2023-07-25 NOTE — Progress Notes (Unsigned)
 Subjective:    Patient ID: Rebecca [name], female    DOB: 04/03/1932, 89 y.o.   MRN: [id number]     HPI Rebecca [name] is here for follow up of her chronic medical problems.   She fell and has worsening of back pain.  She has severe spinal stenosis.  Her legs are weaker and they give out at times.    Has PT coming.  It is helping.  Has a lot of shoulder pain.    Has electric wheelchair  Sugars are good at home.   BP has been low at home - at times 106/55-sometimes she does not take her lisinopril  and sometimes she only takes half.  Medications and allergies reviewed with patient and updated if appropriate.  Current Outpatient Medications on File Prior to Visit  Medication Sig Dispense Refill   acetaminophen  (TYLENOL ) 500 MG tablet Take 1,000 mg by mouth daily as needed for moderate pain.     Ascorbic Acid (VITAMIN C) 500 MG tablet Take 500 mg by mouth daily.     atorvastatin  (LIPITOR) 10 MG tablet TAKE 1 TABLET ONE TIME DAILY AT 6PM 90 tablet 3   blood glucose meter kit and supplies KIT Dispense based on patient and insurance preference. Use up to four times daily as directed. (FOR E11.9).Dispense based on patient and insurance preference. Use up to four times daily as directed. (FOR E11.9). Patient would like to stick with Accu-check meter if covered 1 each 0   calcium  carbonate (OS-CAL) 600 MG TABS Take 600 mg by mouth daily.     Cod Liver Oil 1000 MG CAPS Take 1,000 mg by mouth daily.      diclofenac  Sodium (VOLTAREN ) 1 % GEL Apply 2 g topically 4 (four) times daily. 350 g 0   dorzolamide -timolol  (COSOPT ) 2-0.5 % ophthalmic solution Place 1 drop into the right eye 2 (two) times daily.     Dulaglutide  (TRULICITY ) 4.5 MG/0.5ML SOAJ Inject 4.5 mg into the skin once a week. INJECT 4.5MG  (1 PEN) UNDER THE SKIN EVERY WEEK 2 mL 2   Elastic Bandages & Supports (LUMBAR BACK BRACE/SUPPORT PAD) MISC UAD when standing long periods of time for chronic back pain 1 each 0   ELIQUIS  5  MG TABS tablet TAKE 1 TABLET TWICE DAILY (APPOINTMENT IS NEEDED FOR MORE REFILLS) 180 tablet 3   gabapentin  (NEURONTIN ) 300 MG capsule TAKE 1 CAPSULE EVERY DAY 90 capsule 3   glucose blood (ACCU-CHEK GUIDE) test strip TEST BLOOD SUGAR UP TO FOUR TIMES DAILY 400 strip 2   hydrochlorothiazide  (HYDRODIURIL ) 12.5 MG tablet Take 12.5 mg by mouth daily as needed (swelling).     insulin  aspart (NOVOLOG  FLEXPEN) 100 UNIT/ML FlexPen USE AS DIRECTED BY YOUR PRESCRIBER. COMPLETE DIRECTIONS ARE INCLUDED IN A LETTER WITH YOUR ORIGINAL ORDER 45 mL 3   Insulin  Pen Needle (BD AUTOSHIELD DUO) 30G X 5 MM MISC USE AS DIRECTED WITH INSULIN  PEN NEEDLE 4 TIMES DAILY 400 each 2   Lancet Devices (ONETOUCH DELICA PLUS LANCING) MISC USE AS DIRECTED 1 each 3   Lancets (ONETOUCH DELICA PLUS LANCET33G) MISC USE AS INSTRUCTED 400 each 3   latanoprost  (XALATAN ) 0.005 % ophthalmic solution Place 1 drop into both eyes at bedtime.     lisinopril  (ZESTRIL ) 40 MG tablet TAKE 1 TABLET EVERY DAY 90 tablet 3   Multiple Vitamin (MULTIVITAMIN) tablet Take 1 tablet by mouth daily. Prn     nitroGLYCERIN  (NITROSTAT ) 0.4 MG SL tablet DISSOLVE ONE TABLET UNDER  TONGUE AS DIRECTED FOR CHEST PAIN 25 tablet 5   omeprazole  (PRILOSEC) 20 MG capsule TAKE 1 CAPSULE EVERY DAY (STOP PANTOPRAZOLE ) 90 capsule 3   Turmeric 500 MG CAPS Take 1 capsule by mouth daily in the afternoon.     VYZULTA 0.024 % SOLN Apply 1 drop to eye at bedtime.     No current facility-administered medications on file prior to visit.     Review of Systems  Constitutional:  Negative for fever.  Respiratory:  Negative for cough, shortness of breath and wheezing.   Cardiovascular:  Negative for chest pain, palpitations and leg swelling.  Neurological:  Positive for light-headedness. Negative for headaches.       Objective:   Vitals:   07/26/23 1349  BP: 136/76  Pulse: 62  Temp: 98.7 F (37.1 C)  SpO2: 95%   BP Readings from Last 3 Encounters:  07/26/23 136/76   04/09/23 134/65  03/26/23 138/74   Wt Readings from Last 3 Encounters:  07/26/23 181 lb (82.1 kg)  04/09/23 187 lb (84.8 kg)  03/26/23 179 lb (81.2 kg)   Body mass index is 32.06 kg/m.    Physical Exam Constitutional:      General: She is not in acute distress.    Appearance: Normal appearance.  HENT:     Head: Normocephalic and atraumatic.  Eyes:     Conjunctiva/sclera: Conjunctivae normal.  Cardiovascular:     Rate and Rhythm: Normal rate and regular rhythm.     Heart sounds: Normal heart sounds.  Pulmonary:     Effort: Pulmonary effort is normal. No respiratory distress.     Breath sounds: Normal breath sounds. No wheezing.  Musculoskeletal:     Cervical back: Neck supple.     Right lower leg: Edema (Mild, nonpitting-stable) present.     Left lower leg: Edema (Mild, nonpitting-stable) present.  Lymphadenopathy:     Cervical: No cervical adenopathy.  Skin:    General: Skin is warm and dry.     Findings: No rash.  Neurological:     Mental Status: She is alert. Mental status is at baseline.  Psychiatric:        Mood and Affect: Mood normal.        Behavior: Behavior normal.        Lab Results  Component Value Date   WBC 4.2 03/15/2023   HGB 10.2 (L) 03/15/2023   HCT 30.0 (L) 03/15/2023   PLT 219 03/15/2023   GLUCOSE 123 (H) 03/15/2023   CHOL 179 01/17/2023   TRIG 60.0 01/17/2023   HDL 77.90 01/17/2023   LDLCALC 89 01/17/2023   ALT 14 03/15/2023   AST 18 03/15/2023   NA 140 03/15/2023   K 4.9 03/15/2023   CL 106 03/15/2023   CREATININE 1.40 (H) 03/15/2023   BUN 29 (H) 03/15/2023   CO2 24 03/15/2023   TSH 0.79 01/17/2023   INR 1.2 03/12/2023   HGBA1C 6.0 (H) 01/17/2023   MICROALBUR 1.9 10/24/2015     Assessment & Plan:    See Problem List for Assessment and Plan of chronic medical problems.

## 2023-07-26 ENCOUNTER — Ambulatory Visit (INDEPENDENT_AMBULATORY_CARE_PROVIDER_SITE_OTHER): Admitting: Internal Medicine

## 2023-07-26 VITALS — BP 136/76 | HR 62 | Temp 98.7°F | Ht 63.0 in | Wt 181.0 lb

## 2023-07-26 DIAGNOSIS — I480 Paroxysmal atrial fibrillation: Secondary | ICD-10-CM

## 2023-07-26 DIAGNOSIS — I10 Essential (primary) hypertension: Secondary | ICD-10-CM | POA: Diagnosis not present

## 2023-07-26 DIAGNOSIS — I2510 Atherosclerotic heart disease of native coronary artery without angina pectoris: Secondary | ICD-10-CM

## 2023-07-26 DIAGNOSIS — K219 Gastro-esophageal reflux disease without esophagitis: Secondary | ICD-10-CM

## 2023-07-26 DIAGNOSIS — E559 Vitamin D deficiency, unspecified: Secondary | ICD-10-CM | POA: Diagnosis not present

## 2023-07-26 DIAGNOSIS — Z8639 Personal history of other endocrine, nutritional and metabolic disease: Secondary | ICD-10-CM

## 2023-07-26 DIAGNOSIS — Z794 Long term (current) use of insulin: Secondary | ICD-10-CM

## 2023-07-26 DIAGNOSIS — N1831 Chronic kidney disease, stage 3a: Secondary | ICD-10-CM

## 2023-07-26 DIAGNOSIS — E7800 Pure hypercholesterolemia, unspecified: Secondary | ICD-10-CM

## 2023-07-26 DIAGNOSIS — E1142 Type 2 diabetes mellitus with diabetic polyneuropathy: Secondary | ICD-10-CM | POA: Diagnosis not present

## 2023-07-26 LAB — CBC WITH DIFFERENTIAL/PLATELET
Basophils Absolute: 0.1 10*3/uL (ref 0.0–0.1)
Basophils Relative: 1.9 % (ref 0.0–3.0)
Eosinophils Absolute: 0.1 10*3/uL (ref 0.0–0.7)
Eosinophils Relative: 2.7 % (ref 0.0–5.0)
HCT: 30.4 % — ABNORMAL LOW (ref 36.0–46.0)
Hemoglobin: 10 g/dL — ABNORMAL LOW (ref 12.0–15.0)
Lymphocytes Relative: 32.2 % (ref 12.0–46.0)
Lymphs Abs: 1.5 10*3/uL (ref 0.7–4.0)
MCHC: 32.8 g/dL (ref 30.0–36.0)
MCV: 79.6 fl (ref 78.0–100.0)
Monocytes Absolute: 0.7 10*3/uL (ref 0.1–1.0)
Monocytes Relative: 14.9 % — ABNORMAL HIGH (ref 3.0–12.0)
Neutro Abs: 2.2 10*3/uL (ref 1.4–7.7)
Neutrophils Relative %: 48.3 % (ref 43.0–77.0)
Platelets: 206 10*3/uL (ref 150.0–400.0)
RBC: 3.81 Mil/uL — ABNORMAL LOW (ref 3.87–5.11)
RDW: 16.5 % — ABNORMAL HIGH (ref 11.5–15.5)
WBC: 4.5 10*3/uL (ref 4.0–10.5)

## 2023-07-26 LAB — COMPREHENSIVE METABOLIC PANEL WITH GFR
ALT: 9 U/L (ref 0–35)
AST: 13 U/L (ref 0–37)
Albumin: 3.8 g/dL (ref 3.5–5.2)
Alkaline Phosphatase: 56 U/L (ref 39–117)
BUN: 31 mg/dL — ABNORMAL HIGH (ref 6–23)
CO2: 27 meq/L (ref 19–32)
Calcium: 9.1 mg/dL (ref 8.4–10.5)
Chloride: 106 meq/L (ref 96–112)
Creatinine, Ser: 1.34 mg/dL — ABNORMAL HIGH (ref 0.40–1.20)
GFR: 35.26 mL/min — ABNORMAL LOW (ref >60.00)
Glucose, Bld: 107 mg/dL — ABNORMAL HIGH (ref 70–99)
Potassium: 4.7 meq/L (ref 3.5–5.1)
Sodium: 140 meq/L (ref 135–145)
Total Bilirubin: 0.3 mg/dL (ref 0.2–1.2)
Total Protein: 7 g/dL (ref 6.0–8.3)

## 2023-07-26 LAB — LIPID PANEL
Cholesterol: 172 mg/dL (ref 0–200)
HDL: 71.3 mg/dL (ref >39.00)
LDL Cholesterol: 86 mg/dL (ref 0–99)
NonHDL: 100.43
Total CHOL/HDL Ratio: 2
Triglycerides: 70 mg/dL (ref 0.0–149.0)
VLDL: 14 mg/dL (ref 0.0–40.0)

## 2023-07-26 LAB — HEMOGLOBIN A1C: Hgb A1c MFr Bld: 7.3 % — ABNORMAL HIGH (ref 4.6–6.5)

## 2023-07-26 MED ORDER — LISINOPRIL 20 MG PO TABS: 20.0000 mg | ORAL_TABLET | Freq: Every day | ORAL | 3 refills | Status: AC

## 2023-07-26 NOTE — Assessment & Plan Note (Addendum)
 Chronic Lab Results  Component Value Date   HGBA1C 6.0 (H) 01/17/2023   Sugars have been well-controlled  She does monitor her sugars closely and she will let me know if they are on the low side Continue Trulicity  4.5 mg weekly No longer needs NovoLog  sliding scale if A1c is well controlled -she still takes occasion, but I do not think this is necessary

## 2023-07-26 NOTE — Assessment & Plan Note (Addendum)
 Chronic Following with cardiology Taking Eliquis  twice daily Cmp, cbc

## 2023-07-26 NOTE — Assessment & Plan Note (Signed)
 Chronic Taking vitamin D daily Check vitamin D level

## 2023-07-26 NOTE — Assessment & Plan Note (Signed)
 Clinically euthyroid Check tsh, ft4

## 2023-07-26 NOTE — Assessment & Plan Note (Addendum)
 Chronic Blood ok here but has been low at home  - too low Monitor BP at home Continue hydrochlorothiazide  12.5 mg daily as needed  ( has not taken in a while) Decrease lisinopril  to 20 mg daily

## 2023-07-26 NOTE — Assessment & Plan Note (Signed)
Chronic Following with cardiology No symptoms consistent with angina Continue atorvastatin 10 mg daily

## 2023-07-26 NOTE — Assessment & Plan Note (Signed)
Chronic GERD controlled Continue omeprazole 20 mg daily  

## 2023-07-26 NOTE — Assessment & Plan Note (Signed)
 Chronic Regular exercise and healthy diet encouraged Check lipid panel, CMP Continue atorvastatin 10 mg daily

## 2023-07-26 NOTE — Assessment & Plan Note (Signed)
 Chronic Stable CMP, CBC

## 2023-07-29 DIAGNOSIS — Z794 Long term (current) use of insulin: Secondary | ICD-10-CM | POA: Diagnosis not present

## 2023-07-29 DIAGNOSIS — E119 Type 2 diabetes mellitus without complications: Secondary | ICD-10-CM | POA: Diagnosis not present

## 2023-07-29 DIAGNOSIS — I10 Essential (primary) hypertension: Secondary | ICD-10-CM | POA: Diagnosis not present

## 2023-07-29 DIAGNOSIS — D649 Anemia, unspecified: Secondary | ICD-10-CM | POA: Diagnosis not present

## 2023-07-29 DIAGNOSIS — I2510 Atherosclerotic heart disease of native coronary artery without angina pectoris: Secondary | ICD-10-CM | POA: Diagnosis not present

## 2023-07-29 DIAGNOSIS — M1990 Unspecified osteoarthritis, unspecified site: Secondary | ICD-10-CM | POA: Diagnosis not present

## 2023-07-29 DIAGNOSIS — I480 Paroxysmal atrial fibrillation: Secondary | ICD-10-CM | POA: Diagnosis not present

## 2023-07-29 DIAGNOSIS — M48062 Spinal stenosis, lumbar region with neurogenic claudication: Secondary | ICD-10-CM | POA: Diagnosis not present

## 2023-07-29 DIAGNOSIS — H401133 Primary open-angle glaucoma, bilateral, severe stage: Secondary | ICD-10-CM | POA: Diagnosis not present

## 2023-07-29 DIAGNOSIS — G8220 Paraplegia, unspecified: Secondary | ICD-10-CM | POA: Diagnosis not present

## 2023-07-30 ENCOUNTER — Ambulatory Visit: Payer: Self-pay | Admitting: Internal Medicine

## 2023-07-30 DIAGNOSIS — I480 Paroxysmal atrial fibrillation: Secondary | ICD-10-CM | POA: Diagnosis not present

## 2023-07-30 DIAGNOSIS — M1990 Unspecified osteoarthritis, unspecified site: Secondary | ICD-10-CM | POA: Diagnosis not present

## 2023-07-30 DIAGNOSIS — I10 Essential (primary) hypertension: Secondary | ICD-10-CM | POA: Diagnosis not present

## 2023-07-30 DIAGNOSIS — I2510 Atherosclerotic heart disease of native coronary artery without angina pectoris: Secondary | ICD-10-CM | POA: Diagnosis not present

## 2023-07-30 DIAGNOSIS — E119 Type 2 diabetes mellitus without complications: Secondary | ICD-10-CM | POA: Diagnosis not present

## 2023-07-30 DIAGNOSIS — Z794 Long term (current) use of insulin: Secondary | ICD-10-CM | POA: Diagnosis not present

## 2023-07-30 DIAGNOSIS — M48062 Spinal stenosis, lumbar region with neurogenic claudication: Secondary | ICD-10-CM | POA: Diagnosis not present

## 2023-07-30 DIAGNOSIS — G8220 Paraplegia, unspecified: Secondary | ICD-10-CM | POA: Diagnosis not present

## 2023-07-30 DIAGNOSIS — D649 Anemia, unspecified: Secondary | ICD-10-CM | POA: Diagnosis not present

## 2023-07-30 LAB — TSH: TSH: 0.71 u[IU]/mL (ref 0.35–5.50)

## 2023-07-30 LAB — T4, FREE: Free T4: 0.83 ng/dL (ref 0.60–1.60)

## 2023-07-30 LAB — VITAMIN D 25 HYDROXY (VIT D DEFICIENCY, FRACTURES): VITD: 28.95 ng/mL — ABNORMAL LOW (ref 30.00–100.00)

## 2023-08-02 NOTE — Telephone Encounter (Signed)
 Copied from CRM [id number]. Topic: Clinical - Lab/Test Results >> Aug 02, 2023  4:16 PM Aisha D wrote: Reason for CRM: Pt called in to get the lab results from 5/16. I informed the pt of the results from Dr.Burns and pt stated she is would like for Dr.Burns to giver her a call regarding her A1c level.

## 2023-08-06 DIAGNOSIS — M1990 Unspecified osteoarthritis, unspecified site: Secondary | ICD-10-CM | POA: Diagnosis not present

## 2023-08-06 DIAGNOSIS — I480 Paroxysmal atrial fibrillation: Secondary | ICD-10-CM | POA: Diagnosis not present

## 2023-08-06 DIAGNOSIS — E119 Type 2 diabetes mellitus without complications: Secondary | ICD-10-CM | POA: Diagnosis not present

## 2023-08-06 DIAGNOSIS — D649 Anemia, unspecified: Secondary | ICD-10-CM | POA: Diagnosis not present

## 2023-08-06 DIAGNOSIS — I10 Essential (primary) hypertension: Secondary | ICD-10-CM | POA: Diagnosis not present

## 2023-08-06 DIAGNOSIS — I2510 Atherosclerotic heart disease of native coronary artery without angina pectoris: Secondary | ICD-10-CM | POA: Diagnosis not present

## 2023-08-06 DIAGNOSIS — M48062 Spinal stenosis, lumbar region with neurogenic claudication: Secondary | ICD-10-CM | POA: Diagnosis not present

## 2023-08-06 DIAGNOSIS — Z794 Long term (current) use of insulin: Secondary | ICD-10-CM | POA: Diagnosis not present

## 2023-08-06 DIAGNOSIS — G8220 Paraplegia, unspecified: Secondary | ICD-10-CM | POA: Diagnosis not present

## 2023-08-08 DIAGNOSIS — D649 Anemia, unspecified: Secondary | ICD-10-CM | POA: Diagnosis not present

## 2023-08-08 DIAGNOSIS — I10 Essential (primary) hypertension: Secondary | ICD-10-CM | POA: Diagnosis not present

## 2023-08-08 DIAGNOSIS — E119 Type 2 diabetes mellitus without complications: Secondary | ICD-10-CM | POA: Diagnosis not present

## 2023-08-08 DIAGNOSIS — I480 Paroxysmal atrial fibrillation: Secondary | ICD-10-CM | POA: Diagnosis not present

## 2023-08-08 DIAGNOSIS — M48062 Spinal stenosis, lumbar region with neurogenic claudication: Secondary | ICD-10-CM | POA: Diagnosis not present

## 2023-08-08 DIAGNOSIS — I2510 Atherosclerotic heart disease of native coronary artery without angina pectoris: Secondary | ICD-10-CM | POA: Diagnosis not present

## 2023-08-08 DIAGNOSIS — M1990 Unspecified osteoarthritis, unspecified site: Secondary | ICD-10-CM | POA: Diagnosis not present

## 2023-08-08 DIAGNOSIS — Z794 Long term (current) use of insulin: Secondary | ICD-10-CM | POA: Diagnosis not present

## 2023-08-08 DIAGNOSIS — G8220 Paraplegia, unspecified: Secondary | ICD-10-CM | POA: Diagnosis not present

## 2023-08-09 ENCOUNTER — Other Ambulatory Visit: Payer: Self-pay

## 2023-08-09 ENCOUNTER — Telehealth: Payer: Self-pay | Admitting: Internal Medicine

## 2023-08-09 NOTE — Telephone Encounter (Signed)
 Copied from CRM [id number]. Topic: Clinical - Medical Advice >> Aug 08, 2023  4:06 PM Sasha H wrote: Reason for CRM: Pt is wanting Vitamin D  5000 sent in if possible because she can't find it and can't drive drug store to drug store.

## 2023-08-09 NOTE — Telephone Encounter (Signed)
 Left message for patient that she could purchase any vitamin D  2000 as we discussed yesterday from any of the local pharmacies.

## 2023-08-12 DIAGNOSIS — I10 Essential (primary) hypertension: Secondary | ICD-10-CM | POA: Diagnosis not present

## 2023-08-12 DIAGNOSIS — M48062 Spinal stenosis, lumbar region with neurogenic claudication: Secondary | ICD-10-CM | POA: Diagnosis not present

## 2023-08-12 DIAGNOSIS — E119 Type 2 diabetes mellitus without complications: Secondary | ICD-10-CM | POA: Diagnosis not present

## 2023-08-12 DIAGNOSIS — Z794 Long term (current) use of insulin: Secondary | ICD-10-CM | POA: Diagnosis not present

## 2023-08-12 DIAGNOSIS — M1990 Unspecified osteoarthritis, unspecified site: Secondary | ICD-10-CM | POA: Diagnosis not present

## 2023-08-12 DIAGNOSIS — I2510 Atherosclerotic heart disease of native coronary artery without angina pectoris: Secondary | ICD-10-CM | POA: Diagnosis not present

## 2023-08-12 DIAGNOSIS — I480 Paroxysmal atrial fibrillation: Secondary | ICD-10-CM | POA: Diagnosis not present

## 2023-08-12 DIAGNOSIS — G8220 Paraplegia, unspecified: Secondary | ICD-10-CM | POA: Diagnosis not present

## 2023-08-12 DIAGNOSIS — D649 Anemia, unspecified: Secondary | ICD-10-CM | POA: Diagnosis not present

## 2023-08-16 ENCOUNTER — Other Ambulatory Visit: Payer: Self-pay

## 2023-08-16 ENCOUNTER — Telehealth: Payer: Self-pay | Admitting: Internal Medicine

## 2023-08-16 MED ORDER — VITAMIN D3 125 MCG (5000 UT) PO CAPS: 5000.0000 [IU] | ORAL_CAPSULE | Freq: Every day | ORAL | 3 refills | Status: DC

## 2023-08-16 MED ORDER — VITAMIN D3 125 MCG (5000 UT) PO CAPS: 5000.0000 [IU] | ORAL_CAPSULE | Freq: Every day | ORAL | 3 refills | Status: AC

## 2023-08-16 NOTE — Telephone Encounter (Signed)
 Copied from CRM [id number]. Topic: Clinical - Medication Question >> Aug 16, 2023  3:50 PM Allyne Areola wrote: Patient is calling because her pharmacy is telling her they haven't received the script for Cholecalciferol (VITAMIN D3) 125 MCG (5000 UT) CAPS [ , she would like to know if we can call the pharmacy.

## 2023-08-16 NOTE — Telephone Encounter (Signed)
 Copied from CRM [id number]. Topic: Clinical - Medication Question >> Aug 16, 2023 11:37 AM Dewanda Foots wrote: Reason for CRM: Pt states she has called over and over and has tried to get someone to call her. She is very upset and states that she does not want to talk to the nurse or staff, just Dr. Donnette Gal.   She was told she needs to take Vitamin D  5000 and cannot find it in any of the pharmacies anywhere. She has even had her granddaughter look for her and call around and there is nowhere that carries this, that it needs to be an order for this and she cannot seem to get anyone to call her back about it.  Please have Dr. Donnette Gal call at earliest convenience to help relieve her distress and address her concerns about this. She is very upset and states this has been ongoing for almost a month now.  She is going to the grocery store at 1 PM.  Patient contact [phone]

## 2023-08-16 NOTE — Telephone Encounter (Signed)
 Spoke with patient today and script sent in for her to pick up to make it easier for her to take.

## 2023-08-19 NOTE — Telephone Encounter (Signed)
 Script was resent to pharmacy on Friday.

## 2023-08-20 DIAGNOSIS — Z794 Long term (current) use of insulin: Secondary | ICD-10-CM | POA: Diagnosis not present

## 2023-08-20 DIAGNOSIS — D649 Anemia, unspecified: Secondary | ICD-10-CM | POA: Diagnosis not present

## 2023-08-20 DIAGNOSIS — M48062 Spinal stenosis, lumbar region with neurogenic claudication: Secondary | ICD-10-CM | POA: Diagnosis not present

## 2023-08-20 DIAGNOSIS — I2510 Atherosclerotic heart disease of native coronary artery without angina pectoris: Secondary | ICD-10-CM | POA: Diagnosis not present

## 2023-08-20 DIAGNOSIS — E119 Type 2 diabetes mellitus without complications: Secondary | ICD-10-CM | POA: Diagnosis not present

## 2023-08-20 DIAGNOSIS — I480 Paroxysmal atrial fibrillation: Secondary | ICD-10-CM | POA: Diagnosis not present

## 2023-08-20 DIAGNOSIS — M1990 Unspecified osteoarthritis, unspecified site: Secondary | ICD-10-CM | POA: Diagnosis not present

## 2023-08-20 DIAGNOSIS — G8220 Paraplegia, unspecified: Secondary | ICD-10-CM | POA: Diagnosis not present

## 2023-08-20 DIAGNOSIS — I10 Essential (primary) hypertension: Secondary | ICD-10-CM | POA: Diagnosis not present

## 2023-08-21 DIAGNOSIS — Z794 Long term (current) use of insulin: Secondary | ICD-10-CM | POA: Diagnosis not present

## 2023-08-21 DIAGNOSIS — M1990 Unspecified osteoarthritis, unspecified site: Secondary | ICD-10-CM | POA: Diagnosis not present

## 2023-08-21 DIAGNOSIS — I10 Essential (primary) hypertension: Secondary | ICD-10-CM | POA: Diagnosis not present

## 2023-08-21 DIAGNOSIS — M48062 Spinal stenosis, lumbar region with neurogenic claudication: Secondary | ICD-10-CM | POA: Diagnosis not present

## 2023-08-21 DIAGNOSIS — I480 Paroxysmal atrial fibrillation: Secondary | ICD-10-CM | POA: Diagnosis not present

## 2023-08-21 DIAGNOSIS — E119 Type 2 diabetes mellitus without complications: Secondary | ICD-10-CM | POA: Diagnosis not present

## 2023-08-21 DIAGNOSIS — G8220 Paraplegia, unspecified: Secondary | ICD-10-CM | POA: Diagnosis not present

## 2023-08-21 DIAGNOSIS — I2510 Atherosclerotic heart disease of native coronary artery without angina pectoris: Secondary | ICD-10-CM | POA: Diagnosis not present

## 2023-08-21 DIAGNOSIS — D649 Anemia, unspecified: Secondary | ICD-10-CM | POA: Diagnosis not present

## 2023-08-23 NOTE — Progress Notes (Signed)
 Cardiology Office Note:    Date:  08/28/2023   ID:  Rebecca [name], DOB June 18, 1932, MRN [id number]  PCP:  Geofm Glade PARAS, MD   [facility] Providers Cardiologist:  Alvan Ronal BRAVO, MD (Inactive)     Referring MD: Geofm Glade PARAS, MD   Chief Complaint  Patient presents with   Coronary Artery Disease   Atrial Fibrillation    History of Present Illness:    Rebecca [name] is a 89 y.o. female is seen to establish cardiac care.  Last seen in Dec by Dr Alvan. She has a  hx of CAD PCI in the 1990s s/p stent x2 by Dr Morris, DMII,  HTN sickle cell trait. She has been diagnosed with paroxysmal afib on eliquis . She has CKD stage IIIa. She was admitted for thyroid  storm 01/2020 managed with BB , iodine  drops , hydrocortisone , and methimazole . She was found to have afib with RVR at this time.   She subsequently had a massive PE in 02/2020 causing syncope s/p tpa. Her echo showed normal LV function, no valve dx, PA pressure 46 mmHg, RV had normal function. She had a cardiac monitor during this time that did not show afib.    She  has done well since that time. Still lives alone but next to her son. It primarily limited by arthritis. Denies any chest pain, palpitations, edema. Only notes SOB if walking up hill.        Past Medical History:  Diagnosis Date   Anemia, unspecified    Ankle pain, left    Arthritis    CAD (coronary artery disease)    Esophageal reflux    Fibrocystic breast disease    Hiatal hernia    Hypercholesteremia    Hypertension    IDDM (insulin  dependent diabetes mellitus)    Type II   Multinodular goiter    Sickle-cell trait (HCC)     Past Surgical History:  Procedure Laterality Date   FEMALE  1976   BACK SURGERY  1990   BIOPSY THYROID      Dr.Ellison-Neg    CORONARY ANGIOPLASTY     FOOT SURGERY  2004   RETINAL LASER PROCEDURE      Current Medications: Current Meds  Medication Sig   acetaminophen  (TYLENOL ) 500 MG tablet Take 1,000  mg by mouth daily as needed for moderate pain.   Ascorbic Acid (VITAMIN C) 500 MG tablet Take 500 mg by mouth daily.   atorvastatin  (LIPITOR) 10 MG tablet TAKE 1 TABLET ONE TIME DAILY AT 6PM   blood glucose meter kit and supplies KIT Dispense based on patient and insurance preference. Use up to four times daily as directed. (FOR E11.9).Dispense based on patient and insurance preference. Use up to four times daily as directed. (FOR E11.9). Patient would like to stick with Accu-check meter if covered   calcium  carbonate (OS-CAL) 600 MG TABS Take 600 mg by mouth daily.   Cholecalciferol (VITAMIN D3) 125 MCG (5000 UT) CAPS Take 1 capsule (5,000 Units total) by mouth daily.   Cod Liver Oil 1000 MG CAPS Take 1,000 mg by mouth daily.    dorzolamide -timolol  (COSOPT ) 2-0.5 % ophthalmic solution Place 1 drop into the right eye 2 (two) times daily.   Dulaglutide  (TRULICITY ) 4.5 MG/0.5ML SOAJ Inject 4.5 mg into the skin once a week. INJECT 4.5MG  (1 PEN) UNDER THE SKIN EVERY WEEK   Elastic Bandages & Supports (LUMBAR BACK BRACE/SUPPORT PAD) MISC UAD when standing long periods of time for chronic  back pain   ELIQUIS  5 MG TABS tablet TAKE 1 TABLET TWICE DAILY (APPOINTMENT IS NEEDED FOR MORE REFILLS)   gabapentin  (NEURONTIN ) 300 MG capsule TAKE 1 CAPSULE EVERY DAY   glucose blood (ACCU-CHEK GUIDE) test strip TEST BLOOD SUGAR UP TO FOUR TIMES DAILY   Lancet Devices (ONETOUCH DELICA PLUS LANCING) MISC USE AS DIRECTED   Lancets (ONETOUCH DELICA PLUS LANCET33G) MISC USE AS INSTRUCTED   lisinopril  (ZESTRIL ) 20 MG tablet Take 1 tablet (20 mg total) by mouth daily.   Multiple Vitamin (MULTIVITAMIN) tablet Take 1 tablet by mouth daily. Prn   nitroGLYCERIN  (NITROSTAT ) 0.4 MG SL tablet DISSOLVE ONE TABLET UNDER TONGUE AS DIRECTED FOR CHEST PAIN   omeprazole  (PRILOSEC) 20 MG capsule TAKE 1 CAPSULE EVERY DAY (STOP PANTOPRAZOLE )   Turmeric 500 MG CAPS Take 1 capsule by mouth daily in the afternoon.   VYZULTA 0.024 % SOLN  Apply 1 drop to eye at bedtime.   [DISCONTINUED] Insulin  Pen Needle (BD AUTOSHIELD DUO) 30G X 5 MM MISC USE AS DIRECTED WITH INSULIN  PEN NEEDLE 4 TIMES DAILY     Allergies:   Codeine   Social History   Socioeconomic History   Marital status: Widowed    Spouse name: Not on file   Number of children: 1   Years of education: Not on file   Highest education level: 12th grade  Occupational History   Occupation: retired  Tobacco Use   Smoking status: Never   Smokeless tobacco: Never  Vaping Use   Vaping status: Never Used  Substance and Sexual Activity   Alcohol  use: No   Drug use: No   Sexual activity: Not Currently  Other Topics Concern   Not on file  Social History Narrative   Widowed   Gets regular exercise   Supportive church network      Are you right handed or left handed? Right Handed   Are you currently employed ? No    What is your current occupation?   Do you live at home alone? Yes   Who lives with you?    What type of home do you live in: 1 story or 2 story? Lives in a one story home. Son lives right next door to patient.          One son deceased   One son living        Social Drivers of Corporate investment banker Strain: Low Risk  (12/05/2022)   Overall Financial Resource Strain (CARDIA)    Difficulty of Paying Living Expenses: Not hard at all  Food Insecurity: No Food Insecurity (12/05/2022)   Hunger Vital Sign    Worried About Running Out of Food in the Last Year: Never true    Ran Out of Food in the Last Year: Never true  Transportation Needs: No Transportation Needs (12/05/2022)   PRAPARE - Administrator, Civil Service (Medical): No    Lack of Transportation (Non-Medical): No  Physical Activity: Inactive (12/05/2022)   Exercise Vital Sign    Days of Exercise per Week: 0 days    Minutes of Exercise per Session: 0 min  Stress: No Stress Concern Present (12/05/2022)   [employer] of Occupational Health - Occupational Stress  Questionnaire    Feeling of Stress : Not at all  Social Connections: Moderately Integrated (12/05/2022)   Social Connection and Isolation Panel    Frequency of Communication with Friends and Family: More than three times a week  Frequency of Social Gatherings with Friends and Family: Once a week    Attends Religious Services: More than 4 times per year    Active Member of [employer] or Organizations: Yes    Attends Banker Meetings: More than 4 times per year    Marital Status: Widowed     Family History: The patient's family history includes Diabetes in her mother; Gout in her brother, brother, and sister; Heart attack in her father and mother; Heart disease in an other family member; Hypertension in an other family member; Lung cancer in an other family member; Thyroid  disease in her brother.  ROS:   Please see the history of present illness.     All other systems reviewed and are negative.  EKGs/Labs/Other Studies Reviewed:    The following studies were reviewed today: Echo 02/05/20: IMPRESSIONS     1. Left ventricular ejection fraction, by estimation, is 60 to 65%. The  left ventricle has normal function. The left ventricle has no regional  wall motion abnormalities. Left ventricular diastolic parameters are  consistent with Grade I diastolic  dysfunction (impaired relaxation). Elevated left atrial pressure. There is  the interventricular septum is flattened in systole, consistent with right  ventricular pressure overload.   2. The right ventricular free wall is hypokinetic, but apical motion and  overall systolic function are preserved (McConnell's sign). Right  ventricular systolic function is normal. The right ventricular size is  mildly enlarged. There is moderately  elevated pulmonary artery systolic pressure. The estimated right  ventricular systolic pressure is 46.6 mmHg.   3. Catheter in the right atrium. Right atrial size was mildly dilated.   4. The  mitral valve is normal in structure. No evidence of mitral valve  regurgitation. No evidence of mitral stenosis.   5. The aortic valve is normal in structure. Aortic valve regurgitation is  not visualized. No aortic stenosis is present.   6. The inferior vena cava is normal in size with greater than 50%  respiratory variability, suggesting right atrial pressure of 3 mmHg.   Comparison(s): A prior study was performed on 01/03/2020. Prior images  reviewed side by side. The right ventricular free wall appears to be  hypokinetic, there is abnormal ventricular septal motion and there is a  catheter in the right atrium. otherwise  little change.   Event monitor 02/20/20: No significant abnormalities.   Predominant rhythm is sinus rhythm. Range is 49 to 103 bpm with average of 65 bpm. No atrial fibrillation, sustained ventricular tachycardia, significant pause, or high degree AV block. Total ectopy <1%. 0 patient triggered events. No significant abnormalities.      Recent Labs: 07/26/2023: ALT 9; BUN 31; Creatinine, Ser 1.34; Hemoglobin 10.0; Platelets 206.0; Potassium 4.7; Sodium 140; TSH 0.71  Recent Lipid Panel    Component Value Date/Time   CHOL 172 07/26/2023 1458   TRIG 70.0 07/26/2023 1458   HDL 71.30 07/26/2023 1458   CHOLHDL 2 07/26/2023 1458   VLDL 14.0 07/26/2023 1458   LDLCALC 86 07/26/2023 1458   LDLCALC 94 01/13/2021 1636     Risk Assessment/Calculations:    CHA2DS2-VASc Score = 6   This indicates a 9.7% annual risk of stroke. The patient's score is based upon: CHF History: 0 HTN History: 1 Diabetes History: 1 Stroke History: 0 Vascular Disease History: 1 Age Score: 2 Gender Score: 1               Physical Exam:    VS:  BP ROLLEN)  106/54   Ht 5' 3 (1.6 m)   Wt 179 lb 3.2 oz (81.3 kg)   SpO2 98%   BMI 31.74 kg/m     Wt Readings from Last 3 Encounters:  08/28/23 179 lb 3.2 oz (81.3 kg)  07/26/23 181 lb (82.1 kg)  04/09/23 187 lb (84.8 kg)     GEN:   Well nourished, elderly in no acute distress HEENT: Normal NECK: No JVD; No carotid bruits LYMPHATICS: No lymphadenopathy CARDIAC: RRR, no murmurs, rubs, gallops RESPIRATORY:  Clear to auscultation without rales, wheezing or rhonchi  ABDOMEN: Soft, non-tender, non-distended MUSCULOSKELETAL:  No edema; arthritic deformities in hands and legs SKIN: Warm and dry NEUROLOGIC:  Alert and oriented x 3 PSYCHIATRIC:  Normal affect   ASSESSMENT:    1. Coronary artery disease involving native coronary artery of native heart without angina pectoris   2. Paroxysmal A-fib (HCC)   3. Essential hypertension    PLAN:    In order of problems listed above:  CAD with remote PCI in the 1990s. Now 89 yo. No angina. Not on ASA since on Eliquis . Continue statin.  History of paroxysmal Afib in setting of thyroid  storm in 2021. No clear recurrence. On Eliquis .  History of PE on Eliquis  History of thyroid  storm. Recent labs show normal TFTs.  DM on Trulicity . A1c 7.3%            Medication Adjustments/Labs and Tests Ordered: Current medicines are reviewed at length with the patient today.  Concerns regarding medicines are outlined above.  No orders of the defined types were placed in this encounter.  No orders of the defined types were placed in this encounter.   Patient Instructions  Medication Instructions:  Continue same medications *If you need a refill on your cardiac medications before your next appointment, please call your pharmacy*  Lab Work: None ordered  Testing/Procedures: None ordered  Follow-Up: At [facility], you and your health needs are our priority.  As part of our continuing mission to provide you with exceptional heart care, our providers are all part of one team.  This team includes your primary Cardiologist (physician) and Advanced Practice Providers or APPs (Physician Assistants and Nurse Practitioners) who all work together to provide you with the care you  need, when you need it.  Your next appointment:  6 months    Call in July to schedule Dec appointment     Provider:  Dr.Anniece Bleiler    We recommend signing up for the patient portal called MyChart.  Sign up information is provided on this After Visit Summary.  MyChart is used to connect with patients for Virtual Visits (Telemedicine).  Patients are able to view lab/test results, encounter notes, upcoming appointments, etc.  Non-urgent messages can be sent to your provider as well.   To learn more about what you can do with MyChart, go to [email or web address].          Signed, Celinda Dethlefs [place], MD  08/28/2023 11:13 AM    [facility]

## 2023-08-26 ENCOUNTER — Telehealth: Payer: Self-pay

## 2023-08-26 ENCOUNTER — Telehealth: Payer: Self-pay | Admitting: Podiatry

## 2023-08-26 DIAGNOSIS — M48062 Spinal stenosis, lumbar region with neurogenic claudication: Secondary | ICD-10-CM | POA: Diagnosis not present

## 2023-08-26 DIAGNOSIS — D649 Anemia, unspecified: Secondary | ICD-10-CM | POA: Diagnosis not present

## 2023-08-26 DIAGNOSIS — G8220 Paraplegia, unspecified: Secondary | ICD-10-CM | POA: Diagnosis not present

## 2023-08-26 DIAGNOSIS — I10 Essential (primary) hypertension: Secondary | ICD-10-CM | POA: Diagnosis not present

## 2023-08-26 DIAGNOSIS — E119 Type 2 diabetes mellitus without complications: Secondary | ICD-10-CM | POA: Diagnosis not present

## 2023-08-26 DIAGNOSIS — I2510 Atherosclerotic heart disease of native coronary artery without angina pectoris: Secondary | ICD-10-CM | POA: Diagnosis not present

## 2023-08-26 DIAGNOSIS — Z794 Long term (current) use of insulin: Secondary | ICD-10-CM | POA: Diagnosis not present

## 2023-08-26 DIAGNOSIS — I480 Paroxysmal atrial fibrillation: Secondary | ICD-10-CM | POA: Diagnosis not present

## 2023-08-26 DIAGNOSIS — M1990 Unspecified osteoarthritis, unspecified site: Secondary | ICD-10-CM | POA: Diagnosis not present

## 2023-08-26 NOTE — Telephone Encounter (Signed)
 Patient complaint- pt was measured for shoes but never heard anything back from us  regarding her shoes. Checked safestep.Aaron Aas order was cancelled due to missing docs signature on 08/12/23. The fax number is correct.

## 2023-08-26 NOTE — Telephone Encounter (Signed)
 Patient called in to check status of DM shoes. Please advise, Thanks!

## 2023-08-27 DIAGNOSIS — H401133 Primary open-angle glaucoma, bilateral, severe stage: Secondary | ICD-10-CM | POA: Diagnosis not present

## 2023-08-28 ENCOUNTER — Encounter: Payer: Self-pay | Admitting: Cardiology

## 2023-08-28 ENCOUNTER — Ambulatory Visit: Attending: Cardiology | Admitting: Cardiology

## 2023-08-28 VITALS — BP 106/54 | Ht 63.0 in | Wt 179.2 lb

## 2023-08-28 DIAGNOSIS — I2510 Atherosclerotic heart disease of native coronary artery without angina pectoris: Secondary | ICD-10-CM | POA: Diagnosis not present

## 2023-08-28 DIAGNOSIS — I10 Essential (primary) hypertension: Secondary | ICD-10-CM

## 2023-08-28 DIAGNOSIS — I480 Paroxysmal atrial fibrillation: Secondary | ICD-10-CM

## 2023-08-28 NOTE — Patient Instructions (Signed)
 Medication Instructions:  Continue same medications *If you need a refill on your cardiac medications before your next appointment, please call your pharmacy*  Lab Work: None ordered  Testing/Procedures: None ordered  Follow-Up: At [facility], you and your health needs are our priority.  As part of our continuing mission to provide you with exceptional heart care, our providers are all part of one team.  This team includes your primary Cardiologist (physician) and Advanced Practice Providers or APPs (Physician Assistants and Nurse Practitioners) who all work together to provide you with the care you need, when you need it.  Your next appointment:  6 months    Call in July to schedule Dec appointment     Provider:  Dr.Jordan    We recommend signing up for the patient portal called MyChart.  Sign up information is provided on this After Visit Summary.  MyChart is used to connect with patients for Virtual Visits (Telemedicine).  Patients are able to view lab/test results, encounter notes, upcoming appointments, etc.  Non-urgent messages can be sent to your provider as well.   To learn more about what you can do with MyChart, go to [email or web address].

## 2023-09-11 ENCOUNTER — Telehealth: Payer: Self-pay

## 2023-09-11 NOTE — Telephone Encounter (Signed)
 Spoke with patient today and explained that order was cancelled as 2nd page of [facility] ppw was never signed off by Dr. Geofm. I re-faxed new Ppw to Dr. Geofm if we do not receive ppw then order will not be placed for shoes. Ppw can then be given to patient to take to her Dr and get signed  Lolita

## 2023-09-18 ENCOUNTER — Encounter: Payer: Self-pay | Admitting: Podiatry

## 2023-09-18 ENCOUNTER — Other Ambulatory Visit: Payer: Self-pay | Admitting: Internal Medicine

## 2023-09-18 ENCOUNTER — Ambulatory Visit: Admitting: Podiatry

## 2023-09-18 DIAGNOSIS — B351 Tinea unguium: Secondary | ICD-10-CM

## 2023-09-18 DIAGNOSIS — M79675 Pain in left toe(s): Secondary | ICD-10-CM

## 2023-09-18 DIAGNOSIS — Z794 Long term (current) use of insulin: Secondary | ICD-10-CM

## 2023-09-18 DIAGNOSIS — M79674 Pain in right toe(s): Secondary | ICD-10-CM

## 2023-09-18 DIAGNOSIS — I739 Peripheral vascular disease, unspecified: Secondary | ICD-10-CM | POA: Diagnosis not present

## 2023-09-18 DIAGNOSIS — E1142 Type 2 diabetes mellitus with diabetic polyneuropathy: Secondary | ICD-10-CM | POA: Diagnosis not present

## 2023-09-22 ENCOUNTER — Other Ambulatory Visit: Payer: Self-pay | Admitting: Internal Medicine

## 2023-09-23 NOTE — Progress Notes (Signed)
  Subjective:  Patient ID: Rebecca [name], female    DOB: Mar 20, 1932,  MRN: [id number]  Rebecca [name] presents to clinic today for at risk footcare. Patient has h/o diabetes, neuropathy and PAD and is seen for  and painful elongated mycotic toenails 1-5 bilaterally which are tender when wearing enclosed shoe gear. Pain is relieved with periodic professional debridement. Patient c/o issue of never receiving her diabetic shoes. Has severe flatfoot deformity left foot. She does have AFO dispensed in 2023, which she has not been wearing. She states she has orthotics, but needs shoes to accommodate them. Chief Complaint  Patient presents with   Nail Problem    Patient is here for RFC   New problem(s): None.   PCP is Geofm Glade PARAS, MD.  Allergies  Allergen Reactions   Codeine     Makes me drunk   Review of Systems: Negative except as noted in the HPI.  Objective: No changes noted in today's physical examination. There were no vitals filed for this visit. Rebecca [name] is a pleasant 89 y.o. female WD, WN in NAD. AAO x 3.  Vascular Examination: CFT <3 seconds b/l. DP/PT pulses faintly palpable b/l. Skin temperature gradient warm to warm b/l. No pain with calf compression. No ischemia or gangrene. No cyanosis or clubbing noted b/l. Trace edema noted BLE.   Neurological Examination: Sensation grossly intact b/l with 10 gram monofilament.  Vibratory sensation diminished b/l.  Dermatological Examination: Pedal skin warm and supple b/l.   No open wounds. No interdigital macerations.  Toenails 1-5 b/l thick, discolored, elongated with subungual debris and pain on dorsal palpation.    No corns, calluses nor porokeratotic lesions noted.  Musculoskeletal Examination: Muscle strength 5/5 to all LE muscle groups of right foot. Severely collapsed pes planovalgus of LLE. Utilizes walker for ambulation assistance.  Radiographs: None  Last A1c:      Latest Ref Rng & Units  07/26/2023    2:58 PM 01/17/2023    2:36 PM  Hemoglobin A1C  Hemoglobin-A1c 4.6 - 6.5 % 7.3  6.0    Assessment/Plan: 1. Pain due to onychomycosis of toenails of both feet   2. PVD (peripheral vascular disease) (HCC)   3. Diabetic peripheral neuropathy associated with type 2 diabetes mellitus (HCC)   Will contact orthotics and prosthetics department to resent paperwork for diabetic shoes to PCP. She does have AFO for left foot, but prefers to wear orthotics. Patient was evaluated and treated. All patient's and/or POA's questions/concerns addressed on today's visit. Toenails 1-5 debrided in length and girth without incident. Continue foot and shoe inspections daily. Monitor blood glucose per PCP/Endocrinologist's recommendations. Continue soft, supportive shoe gear daily. Report any pedal injuries to medical professional. Call office if there are any questions/concerns. -Patient/POA to call should there be question/concern in the interim.   Return in about 3 months (around 12/19/2023).  Rebecca [name], DPM      [facility] LOCATION: [address] [place] [address]                   Office [phone]   [facility] LOCATION: [address] [place], [place] [address] Office [phone]

## 2023-09-27 ENCOUNTER — Telehealth: Payer: Self-pay

## 2023-09-27 NOTE — Telephone Encounter (Signed)
 Rcvd ppw exp date 12/26/23 Reorderd shoes 09/27/23 Ppw faxed to [employer] 09/27/23

## 2023-10-07 DIAGNOSIS — H401133 Primary open-angle glaucoma, bilateral, severe stage: Secondary | ICD-10-CM | POA: Diagnosis not present

## 2023-10-07 NOTE — Telephone Encounter (Signed)
 Called to schedule dm shoe fitting/ pu. Call dropped

## 2023-10-09 ENCOUNTER — Telehealth: Payer: Self-pay

## 2023-10-09 NOTE — Telephone Encounter (Signed)
 2nd attempt LVM to schedule DM shoe fitting/ pu

## 2023-10-09 NOTE — Telephone Encounter (Signed)
 Copied from CRM [id number]. Topic: General - Other >> Oct 09, 2023  4:25 PM Marissa P wrote: Reason for CRM: Patient is calling Norman Longs C back, please return her call regarding the shoe fitting please.  Tomorrow around 3pm she will be looking forward to the call and anytime after please.

## 2023-10-10 ENCOUNTER — Telehealth: Payer: Self-pay | Admitting: Internal Medicine

## 2023-10-10 NOTE — Telephone Encounter (Signed)
 Copied from CRM [id number]. Topic: General - Other >> Oct 10, 2023  9:38 AM Franky GRADE wrote: Reason for CRM: [facility] is calling because they received forms signed by Dr.Burns; however, there was a signature missing. They will be faxing over the documentation to get it completed in order to process the shoes for the patient.

## 2023-10-10 NOTE — Telephone Encounter (Signed)
 Waiting for form to be faxed.

## 2023-10-16 NOTE — Telephone Encounter (Signed)
 Copied from CRM [id number]. Topic: General - Other >> Oct 16, 2023  3:38 PM Mia F wrote: Pt calling back from 7/31 phone call stating that she received a call saying Dr Geofm was trying to get in touch with her. She says she believes the call is about the form that was supposed to be completed for her to get shoes. Based on the notes the office was waiting for form to be refaxed. Please provide pt with an update

## 2023-10-17 NOTE — Telephone Encounter (Signed)
 Attempted to reach patient today but line was busy several times.  Called back and phone kept ringing with not VM picking up.   Refaxed forms previously scanned in her chart today with forms resigned again.  Fax conformation received.

## 2023-11-15 ENCOUNTER — Ambulatory Visit

## 2023-11-15 VITALS — Ht 63.0 in | Wt 179.0 lb

## 2023-11-15 DIAGNOSIS — Z0000 Encounter for general adult medical examination without abnormal findings: Secondary | ICD-10-CM | POA: Diagnosis not present

## 2023-11-15 NOTE — Progress Notes (Signed)
 Subjective:   Rebecca [name] is a 89 y.o. who presents for a Medicare Wellness preventive visit.  As a reminder, Annual Wellness Visits don't include a physical exam, and some assessments may be limited, especially if this visit is performed virtually. We may recommend an in-person follow-up visit with your provider if needed.  Visit Complete: Virtual I connected with  Rebecca [name] on 11/15/23 by a audio enabled telemedicine application and verified that I am speaking with the correct person using two identifiers.  Patient Location: Home  Provider Location: Office/Clinic  I discussed the limitations of evaluation and management by telemedicine. The patient expressed understanding and agreed to proceed.  Vital Signs: Because this visit was a virtual/telehealth visit, some criteria may be missing or patient reported. Any vitals not documented were not able to be obtained and vitals that have been documented are patient reported.  VideoDeclined- This patient declined Librarian, academic. Therefore the visit was completed with audio only.  Persons Participating in Visit: Patient.  AWV Questionnaire: No: Patient Medicare AWV questionnaire was not completed prior to this visit.  Cardiac Risk Factors include: advanced age (>33men, >4 women);diabetes mellitus;hypertension;Other (see comment), Risk factor comments: CKD stage 3a     Objective:    Today's Vitals   11/15/23 1606  Weight: 179 lb (81.2 kg)  Height: 5' 3 (1.6 m)   Body mass index is 31.71 kg/m.     11/15/2023    4:13 PM 04/09/2023    2:07 PM 03/14/2023    4:51 PM 12/05/2022    3:11 PM 09/23/2022    6:27 PM 02/08/2022   10:15 PM 11/24/2021    1:16 PM  Advanced Directives  Does Patient Have a Medical Advance Directive? Yes Yes Yes Yes No Yes Yes  Type of Estate agent of [place];Living will Healthcare Power of [place];Living will Healthcare Power of  [place];Living will Healthcare Power of [employer] Power of Attorney   Copy of Healthcare Power of Attorney in Chart? No - copy requested   No - copy requested       Current Medications (verified) Outpatient Encounter Medications as of 11/15/2023  Medication Sig   acetaminophen  (TYLENOL ) 500 MG tablet Take 1,000 mg by mouth daily as needed for moderate pain.   Ascorbic Acid (VITAMIN C) 500 MG tablet Take 500 mg by mouth daily.   atorvastatin  (LIPITOR) 10 MG tablet TAKE 1 TABLET ONE TIME DAILY AT 6PM   blood glucose meter kit and supplies KIT Dispense based on patient and insurance preference. Use up to four times daily as directed. (FOR E11.9).Dispense based on patient and insurance preference. Use up to four times daily as directed. (FOR E11.9). Patient would like to stick with Accu-check meter if covered   calcium  carbonate (OS-CAL) 600 MG TABS Take 600 mg by mouth daily.   Cholecalciferol (VITAMIN D3) 125 MCG (5000 UT) CAPS Take 1 capsule (5,000 Units total) by mouth daily.   Cod Liver Oil 1000 MG CAPS Take 1,000 mg by mouth daily.    dorzolamide -timolol  (COSOPT ) 2-0.5 % ophthalmic solution Place 1 drop into the right eye 2 (two) times daily.   Dulaglutide  (TRULICITY ) 4.5 MG/0.5ML SOAJ INJECT 4.5MG  (1 PEN) UNDER THE SKIN EVERY WEEK   Elastic Bandages & Supports (LUMBAR BACK BRACE/SUPPORT PAD) MISC UAD when standing long periods of time for chronic back pain   ELIQUIS  5 MG TABS tablet TAKE 1 TABLET TWICE DAILY   gabapentin  (NEURONTIN ) 300 MG capsule TAKE  1 CAPSULE EVERY DAY   glucose blood (ACCU-CHEK GUIDE) test strip TEST BLOOD SUGAR UP TO FOUR TIMES DAILY   Lancet Devices (ONETOUCH DELICA PLUS LANCING) MISC USE AS DIRECTED   Lancets (ONETOUCH DELICA PLUS LANCET33G) MISC USE AS INSTRUCTED   lisinopril  (ZESTRIL ) 20 MG tablet Take 1 tablet (20 mg total) by mouth daily.   Multiple Vitamin (MULTIVITAMIN) tablet Take 1 tablet by mouth daily. Prn   nitroGLYCERIN  (NITROSTAT ) 0.4 MG SL  tablet DISSOLVE ONE TABLET UNDER TONGUE AS DIRECTED FOR CHEST PAIN   omeprazole  (PRILOSEC) 20 MG capsule TAKE 1 CAPSULE EVERY DAY (STOP PANTOPRAZOLE )   Turmeric 500 MG CAPS Take 1 capsule by mouth daily in the afternoon.   VYZULTA 0.024 % SOLN Apply 1 drop to eye at bedtime.   No facility-administered encounter medications on file as of 11/15/2023.    Allergies (verified) Codeine   History: Past Medical History:  Diagnosis Date   Anemia, unspecified    Ankle pain, left    Arthritis    CAD (coronary artery disease)    Esophageal reflux    Fibrocystic breast disease    Hiatal hernia    Hypercholesteremia    Hypertension    IDDM (insulin  dependent diabetes mellitus)    Type II   Multinodular goiter    Sickle-cell trait (HCC)    Past Surgical History:  Procedure Laterality Date   FEMALE  1976   BACK SURGERY  1990   BIOPSY THYROID      Dr.Ellison-Neg    CORONARY ANGIOPLASTY     FOOT SURGERY  2004   RETINAL LASER PROCEDURE     Family History  Problem Relation Age of Onset   Hypertension Other    Lung cancer Other    Heart disease Other    Gout Sister    Gout Brother    Thyroid  disease Brother    Heart attack Father    Heart attack Mother    Diabetes Mother    Gout Brother    Social History   Socioeconomic History   Marital status: Widowed    Spouse name: Not on file   Number of children: 1   Years of education: Not on file   Highest education level: 12th grade  Occupational History   Occupation: retired  Tobacco Use   Smoking status: Never   Smokeless tobacco: Never  Vaping Use   Vaping status: Never Used  Substance and Sexual Activity   Alcohol  use: No   Drug use: No   Sexual activity: Not Currently  Other Topics Concern   Not on file  Social History Narrative   Widowed   Gets regular exercise   Lives alone/2025   Supportive church network      Are you right handed or left handed? Right Handed   Are you currently employed ? No    What is your  current occupation?   Do you live at home alone? Yes   Who lives with you?    What type of home do you live in: 1 story or 2 story? Lives in a one story home. Son lives right next door to patient.          One son deceased   One son living        Social Drivers of Corporate investment banker Strain: Low Risk  (11/15/2023)   Overall Financial Resource Strain (CARDIA)    Difficulty of Paying Living Expenses: Not hard at all  Food Insecurity: No Food  Insecurity (11/15/2023)   Hunger Vital Sign    Worried About Running Out of Food in the Last Year: Never true    Ran Out of Food in the Last Year: Never true  Transportation Needs: No Transportation Needs (11/15/2023)   PRAPARE - Administrator, Civil Service (Medical): No    Lack of Transportation (Non-Medical): No  Physical Activity: Inactive (11/15/2023)   Exercise Vital Sign    Days of Exercise per Week: 0 days    Minutes of Exercise per Session: 0 min  Stress: No Stress Concern Present (11/15/2023)   [employer] of Occupational Health - Occupational Stress Questionnaire    Feeling of Stress: Not at all  Social Connections: Moderately Integrated (11/15/2023)   Social Connection and Isolation Panel    Frequency of Communication with Friends and Family: More than three times a week    Frequency of Social Gatherings with Friends and Family: Once a week    Attends Religious Services: More than 4 times per year    Active Member of [employer] or Organizations: Yes    Attends Banker Meetings: Never    Marital Status: Widowed    Tobacco Counseling Counseling given: Not Answered    Clinical Intake:  Pre-visit preparation completed: Yes  Pain : No/denies pain     BMI - recorded: 31.71 Nutritional Status: BMI > 30  Obese Nutritional Risks: None Diabetes: Yes CBG done?: Yes (84-per pt) CBG resulted in Enter/ Edit results?: No Did pt. bring in CBG monitor from home?: No  Lab Results  Component Value Date    HGBA1C 7.3 (H) 07/26/2023   HGBA1C 6.0 (H) 01/17/2023   HGBA1C 7.6 (H) 07/27/2022     How often do you need to have someone help you when you read instructions, pamphlets, or other written materials from your doctor or pharmacy?: 1 - Never  Interpreter Needed?: No  Information entered by :: Kirill Chatterjee, RMA   Activities of Daily Living     11/15/2023    4:08 PM 12/05/2022    3:12 PM  In your present state of health, do you have any difficulty performing the following activities:  Hearing? 0 0  Vision? 0 0  Difficulty concentrating or making decisions? 0 0  Walking or climbing stairs? 0 1  Dressing or bathing? 0 0  Doing errands, shopping? 0 1  Comment tranportation/ mobli meal   Preparing Food and eating ? N N  Using the Toilet? N N  In the past six months, have you accidently leaked urine? N Y  Do you have problems with loss of bowel control? N N  Managing your Medications? N N  Managing your Finances? N N  Housekeeping or managing your Housekeeping? N N    Patient Care Team: Geofm Glade PARAS, MD as PCP - General (Internal Medicine) Alvan Ronal BRAVO, MD (Inactive) as PCP - Cardiology (Cardiology) Patel, Donika K, DO as Consulting Physician (Neurology)  I have updated your Care Teams any recent Medical Services you may have received from other providers in the past year.     Assessment:   This is a routine wellness examination for Rebecca [name].  Hearing/Vision screen Hearing Screening - Comments:: Denies hearing difficulties   Vision Screening - Comments:: Wears eyeglasses/Dr. Fleeta   Goals Addressed   None    Depression Screen     11/15/2023    4:17 PM 03/26/2023    2:51 PM 01/17/2023    1:03 PM 12/05/2022  3:14 PM 10/31/2022   10:31 AM 10/01/2022   11:06 AM 05/10/2022    3:24 PM  PHQ 2/9 Scores  PHQ - 2 Score 0 0 0 0 0 0 0  PHQ- 9 Score 0  1 0 4      Fall Risk     11/15/2023    4:13 PM 04/09/2023    2:07 PM 03/26/2023    2:50 PM 01/17/2023    1:03 PM 12/05/2022     3:05 PM  Fall Risk   Falls in the past year? 1 1 1 1 1   Number falls in past yr: 1 1 1  0 1  Injury with Fall? 1 1 1 1 1   Comment back injury      Risk for fall due to :   Impaired balance/gait History of fall(s)   Follow up Falls evaluation completed;Falls prevention discussed Falls evaluation completed Falls evaluation completed Falls evaluation completed Falls evaluation completed;Education provided;Falls prevention discussed    MEDICARE RISK AT HOME:  Medicare Risk at Home Any stairs in or around the home?: No If so, are there any without handrails?: No Home free of loose throw rugs in walkways, pet beds, electrical cords, etc?: Yes Adequate lighting in your home to reduce risk of falls?: Yes Life alert?: No Use of a cane, walker or w/c?: No Grab bars in the bathroom?: Yes Shower chair or bench in shower?: Yes Elevated toilet seat or a handicapped toilet?: Yes  TIMED UP AND GO:  Was the test performed?  No  Cognitive Function: Declined/Normal: No cognitive concerns noted by patient or family. Patient alert, oriented, able to answer questions appropriately and recall recent events. No signs of memory loss or confusion.    11/18/2017   11:33 AM 07/23/2016    4:14 PM  MMSE - Mini Mental State Exam  Orientation to time 5 5   Orientation to Place 5 5   Registration 3 3   Attention/ Calculation 5 4   Recall 1 2   Language- name 2 objects 2 2   Language- repeat 1 1  Language- follow 3 step command 3 3   Language- read & follow direction 1 1   Write a sentence 1 1   Copy design 1 1   Total score 28 28      Data saved with a previous flowsheet row definition        12/05/2022    3:14 PM 11/24/2021    1:17 PM 11/17/2020    6:37 PM 11/24/2018    1:56 PM  6CIT Screen  What Year? 0 points 0 points 0 points 0 points  What month? 0 points 0 points 0 points 0 points  What time? 0 points 0 points 0 points 0 points  Count back from 20 0 points 0 points 0 points 0 points   Months in reverse 2 points 0 points 4 points 2 points  Repeat phrase 2 points 4 points 0 points 0 points  Total Score 4 points 4 points 4 points 2 points    Immunizations Immunization History  Administered Date(s) Administered   Fluad Quad(high Dose 65+) 11/24/2018, 01/08/2020, 01/26/2022   Fluad Trivalent(High Dose 65+) 01/17/2023   INFLUENZA, HIGH DOSE SEASONAL PF 02/07/2016, 02/04/2017, 01/27/2018   Influenza Split 01/22/2011, 02/20/2012   Influenza Whole 03/13/1999, 01/01/2008, 02/07/2009, 12/13/2009   Influenza,inj,Quad PF,6+ Mos 01/16/2013   Moderna Covid-19 Vaccine Bivalent Booster 26yrs & up 09/10/2020   PFIZER(Purple Top)SARS-COV-2 Vaccination 04/25/2019, 05/20/2019, 12/26/2019   Pneumococcal  Conjugate-13 07/14/2015   Pneumococcal Polysaccharide-23 05/08/2006    Screening Tests Health Maintenance  Topic Date Due   Zoster Vaccines- Shingrix (1 of 2) Never done   Influenza Vaccine  10/11/2023   COVID-19 Vaccine (5 - 2025-26 season) 11/11/2023   HEMOGLOBIN A1C  01/26/2024   DEXA SCAN  05/16/2024   FOOT EXAM  05/20/2024   OPHTHALMOLOGY EXAM  05/29/2024   Medicare Annual Wellness (AWV)  11/14/2024   Pneumococcal Vaccine: 50+ Years  Completed   HPV VACCINES  Aged Out   Meningococcal B Vaccine  Aged Out   DTaP/Tdap/Td  Discontinued    Health Maintenance Items Addressed: See Nurse Notes at the end of this note  Additional Screening:  Vision Screening: Recommended annual ophthalmology exams for early detection of glaucoma and other disorders of the eye. Is the patient up to date with their annual eye exam?  No  Who is the provider or what is the name of the office in which the patient attends annual eye exams? Dr. Fleeta  Dental Screening: Recommended annual dental exams for proper oral hygiene  Community Resource Referral / Chronic Care Management: CRR required this visit?  No   CCM required this visit?  No   Plan:    I have personally reviewed and noted the  following in the patient's chart:   Medical and social history Use of alcohol , tobacco or illicit drugs  Current medications and supplements including opioid prescriptions. Patient is not currently taking opioid prescriptions. Functional ability and status Nutritional status Physical activity Advanced directives List of other physicians Hospitalizations, surgeries, and ER visits in previous 12 months Vitals Screenings to include cognitive, depression, and falls Referrals and appointments  In addition, I have reviewed and discussed with patient certain preventive protocols, quality metrics, and best practice recommendations. A written personalized care plan for preventive services as well as general preventive health recommendations were provided to patient.   Whitten Andreoni L Luetta Piazza, CMA   11/15/2023   After Visit Summary: (MyChart) Due to this being a telephonic visit, the after visit summary with patients personalized plan was offered to patient via MyChart   Notes: Patient is due for a Flu vaccine and would like to wait until her next office visit here to get it.  Patient also stated that she would like her tyroid checked during her up coming office visit with Dr. Geofm.  She had no other concerns to address today.

## 2023-11-15 NOTE — Patient Instructions (Addendum)
 Rebecca [name],  Thank you for taking the time for your Medicare Wellness Visit. I appreciate your continued commitment to your health goals. Please review the care plan we discussed, and feel free to reach out if I can assist you further.  Medicare recommends these wellness visits once per year to help you and your care team stay ahead of potential health issues. These visits are designed to focus on prevention, allowing your provider to concentrate on managing your acute and chronic conditions during your regular appointments.  Please note that Annual Wellness Visits do not include a physical exam. Some assessments may be limited, especially if the visit was conducted virtually. If needed, we may recommend a separate in-person follow-up with your provider.  Ongoing Care Seeing your primary care provider every 3 to 6 months helps us  monitor your health and provide consistent, personalized care. Last office visit on 07/26/2023.  Referrals If a referral was made during today's visit and you haven't received any updates within two weeks, please contact the referred provider directly to check on the status.  Recommended Screenings:  Health Maintenance  Topic Date Due   Zoster (Shingles) Vaccine (1 of 2) Never done   Flu Shot  10/11/2023   COVID-19 Vaccine (5 - 2025-26 season) 11/11/2023   Hemoglobin A1C  01/26/2024   DEXA scan (bone density measurement)  05/16/2024   Complete foot exam   05/20/2024   Eye exam for diabetics  05/29/2024   Medicare Annual Wellness Visit  11/14/2024   Pneumococcal Vaccine for age over 76  Completed   HPV Vaccine  Aged Out   Meningitis B Vaccine  Aged Out   DTaP/Tdap/Td vaccine  Discontinued       11/15/2023    4:13 PM  Advanced Directives  Does Patient Have a Medical Advance Directive? Yes  Type of Estate agent of [place];Living will  Copy of Healthcare Power of Attorney in Chart? No - copy requested   Advance Care Planning is  important because it: Ensures you receive medical care that aligns with your values, goals, and preferences. Provides guidance to your family and loved ones, reducing the emotional burden of decision-making during critical moments.  Vision: Annual vision screenings are recommended for early detection of glaucoma, cataracts, and diabetic retinopathy. These exams can also reveal signs of chronic conditions such as diabetes and high blood pressure.  Dental: Annual dental screenings help detect early signs of oral cancer, gum disease, and other conditions linked to overall health, including heart disease and diabetes.  Please see the attached documents for additional preventive care recommendations. You are due for flu vaccine and can get it here during your next (pt prefers).

## 2023-11-28 ENCOUNTER — Ambulatory Visit

## 2023-11-28 DIAGNOSIS — E1142 Type 2 diabetes mellitus with diabetic polyneuropathy: Secondary | ICD-10-CM

## 2023-11-28 DIAGNOSIS — I739 Peripheral vascular disease, unspecified: Secondary | ICD-10-CM

## 2023-11-28 DIAGNOSIS — L84 Corns and callosities: Secondary | ICD-10-CM

## 2023-11-28 DIAGNOSIS — M2141 Flat foot [pes planus] (acquired), right foot: Secondary | ICD-10-CM

## 2023-11-28 DIAGNOSIS — M2142 Flat foot [pes planus] (acquired), left foot: Secondary | ICD-10-CM

## 2023-11-28 NOTE — Progress Notes (Signed)

## 2023-12-12 NOTE — Telephone Encounter (Signed)
 Error

## 2023-12-28 ENCOUNTER — Other Ambulatory Visit: Payer: Self-pay | Admitting: Internal Medicine

## 2023-12-31 ENCOUNTER — Ambulatory Visit: Admitting: Podiatry

## 2023-12-31 ENCOUNTER — Encounter: Payer: Self-pay | Admitting: Podiatry

## 2023-12-31 DIAGNOSIS — M79674 Pain in right toe(s): Secondary | ICD-10-CM | POA: Diagnosis not present

## 2023-12-31 DIAGNOSIS — E1142 Type 2 diabetes mellitus with diabetic polyneuropathy: Secondary | ICD-10-CM | POA: Diagnosis not present

## 2023-12-31 DIAGNOSIS — B351 Tinea unguium: Secondary | ICD-10-CM

## 2023-12-31 DIAGNOSIS — M79675 Pain in left toe(s): Secondary | ICD-10-CM

## 2024-01-05 NOTE — Progress Notes (Signed)
  Subjective:  Patient ID: Rebecca [name], female    DOB: [birth year],  MRN: [id number]  Rebecca [name] presents to clinic today for at risk foot care with history of diabetic neuropathy and painful mycotic toenails x 10 which interfere with daily activities. Pain is relieved with periodic professional debridement. Patient has received her new diabetic shoes. Chief Complaint  Patient presents with   RFC    DFC A1c 7.3 PCP  Dr Glade Hope, May 2025   New problem(s): None.   PCP is Hope Glade PARAS, MD.  Allergies  Allergen Reactions   Codeine     Makes me drunk    Review of Systems: Negative except as noted in the HPI.  Objective: No changes noted in today's physical examination. There were no vitals filed for this visit. Rebecca [name] is a pleasant 89 y.o. female WD, WN in NAD. AAO x 3.  Vascular Examination: CFT <3 seconds b/l. DP/PT pulses faintly palpable b/l. Digital hair absent. Skin temperature gradient warm to warm b/l. No pain with calf compression. No ischemia or gangrene. No cyanosis or clubbing noted b/l. Trace edema noted BLE.   Neurological Examination: Protective sensation intact 5/5 intact bilaterally with 10g monofilament b/l. Vibratory sensation diminished b/l.  Dermatological Examination: Pedal skin warm and supple b/l.   No open wounds. No interdigital macerations.  Toenails 1-5 b/l thick, discolored, elongated with subungual debris and pain on dorsal palpation.    No hyperkeratotic nor porokeratotic lesions.  Musculoskeletal Examination: Muscle strength 5/5 to all lower extremity muscle groups bilaterally. No pain, crepitus or joint limitation noted with ROM b/l LE. Muscle strength 5/5 to all LE muscle groups of right foot. Pes planovalgus deformity noted left foot.. Patient ambulates with walker assistance.  Radiographs: None  Last A1c:      Latest Ref Rng & Units 07/26/2023    2:58 PM 01/17/2023    2:36 PM  Hemoglobin A1C   Hemoglobin-A1c 4.6 - 6.5 % 7.3  6.0    Assessment/Plan: 1. Pain due to onychomycosis of toenails of both feet   2. Diabetic peripheral neuropathy associated with type 2 diabetes mellitus (HCC)   -Patient was evaluated today. All questions/concerns addressed on today's visit. -Examined patient. -Continue foot and shoe inspections daily. Monitor blood glucose per PCP/Endocrinologist's recommendations. -Continue diabetic shoes daily. -Mycotic toenails 2-5 bilaterally and left great toe were debrided in length and girth with sterile nail nippers and dremel without iatrogenic bleeding. -Patient has AFO for LLE, but prefers to wear orthotics. -Patient/POA to call should there be question/concern in the interim.   Return in about 3 months (around 04/01/2024).  Delon LITTIE Merlin, DPM      [place] LOCATION: [address], [place] [address]                   Office [phone]   [facility] LOCATION: [address] [place], [place] [address] Office [phone]

## 2024-01-08 DIAGNOSIS — G8220 Paraplegia, unspecified: Secondary | ICD-10-CM | POA: Diagnosis not present

## 2024-01-08 DIAGNOSIS — Z9181 History of falling: Secondary | ICD-10-CM | POA: Diagnosis not present

## 2024-01-08 DIAGNOSIS — M8580 Other specified disorders of bone density and structure, unspecified site: Secondary | ICD-10-CM | POA: Diagnosis not present

## 2024-01-08 DIAGNOSIS — I2510 Atherosclerotic heart disease of native coronary artery without angina pectoris: Secondary | ICD-10-CM | POA: Diagnosis not present

## 2024-01-08 DIAGNOSIS — Z7901 Long term (current) use of anticoagulants: Secondary | ICD-10-CM | POA: Diagnosis not present

## 2024-01-08 DIAGNOSIS — J309 Allergic rhinitis, unspecified: Secondary | ICD-10-CM | POA: Diagnosis not present

## 2024-01-08 DIAGNOSIS — I4891 Unspecified atrial fibrillation: Secondary | ICD-10-CM | POA: Diagnosis not present

## 2024-01-08 DIAGNOSIS — E1142 Type 2 diabetes mellitus with diabetic polyneuropathy: Secondary | ICD-10-CM | POA: Diagnosis not present

## 2024-01-08 DIAGNOSIS — E113599 Type 2 diabetes mellitus with proliferative diabetic retinopathy without macular edema, unspecified eye: Secondary | ICD-10-CM | POA: Diagnosis not present

## 2024-01-08 DIAGNOSIS — I69365 Other paralytic syndrome following cerebral infarction, bilateral: Secondary | ICD-10-CM | POA: Diagnosis not present

## 2024-01-08 DIAGNOSIS — D6869 Other thrombophilia: Secondary | ICD-10-CM | POA: Diagnosis not present

## 2024-01-08 DIAGNOSIS — E785 Hyperlipidemia, unspecified: Secondary | ICD-10-CM | POA: Diagnosis not present

## 2024-01-08 DIAGNOSIS — H409 Unspecified glaucoma: Secondary | ICD-10-CM | POA: Diagnosis not present

## 2024-01-08 DIAGNOSIS — E0500 Thyrotoxicosis with diffuse goiter without thyrotoxic crisis or storm: Secondary | ICD-10-CM | POA: Diagnosis not present

## 2024-01-08 DIAGNOSIS — I509 Heart failure, unspecified: Secondary | ICD-10-CM | POA: Diagnosis not present

## 2024-01-08 DIAGNOSIS — E1151 Type 2 diabetes mellitus with diabetic peripheral angiopathy without gangrene: Secondary | ICD-10-CM | POA: Diagnosis not present

## 2024-01-08 DIAGNOSIS — I130 Hypertensive heart and chronic kidney disease with heart failure and stage 1 through stage 4 chronic kidney disease, or unspecified chronic kidney disease: Secondary | ICD-10-CM | POA: Diagnosis not present

## 2024-01-08 DIAGNOSIS — N189 Chronic kidney disease, unspecified: Secondary | ICD-10-CM | POA: Diagnosis not present

## 2024-01-15 NOTE — Progress Notes (Unsigned)
 Cardiology Office Note:    Date:  01/17/2024   ID:  Rebecca [name], DOB 26-Jun-1932, MRN [id number]  PCP:  Geofm Glade PARAS, MD   [facility] Providers Cardiologist:  Alvan Ronal BRAVO, MD (Inactive)     Referring MD: Geofm Glade PARAS, MD   Chief Complaint  Patient presents with   Coronary Artery Disease   Atrial Fibrillation    History of Present Illness:    Rebecca [name] is a 89 y.o. female is seen for follow up. She has a  hx of CAD PCI in the 1990s s/p stent x2 by Dr Morris, DMII,  HTN,  sickle cell trait. She has been diagnosed with paroxysmal afib on eliquis . She has CKD stage IIIa. She was admitted for thyroid  storm 01/2020 managed with BB , iodine  drops , hydrocortisone , and methimazole . She was found to have afib with RVR at this time.   She subsequently had a massive PE in 02/2020 causing syncope s/p tpa. Her echo showed normal LV function, no valve dx, PA pressure 46 mmHg, RV had normal function. She had a cardiac monitor during this time that did not show afib.    On follow up she is doing very well.  Still lives alone but next to her son. Is limited by arthritis. Denies any chest pain, palpitations, edema. No dyspnea. No bleeding.       Past Medical History:  Diagnosis Date   Anemia, unspecified    Ankle pain, left    Arthritis    CAD (coronary artery disease)    Esophageal reflux    Fibrocystic breast disease    Hiatal hernia    Hypercholesteremia    Hypertension    IDDM (insulin  dependent diabetes mellitus)    Type II   Multinodular goiter    Sickle-cell trait     Past Surgical History:  Procedure Laterality Date   FEMALE  1976   BACK SURGERY  1990   BIOPSY THYROID      Dr.Ellison-Neg    CORONARY ANGIOPLASTY     FOOT SURGERY  2004   RETINAL LASER PROCEDURE      Current Medications: Current Meds  Medication Sig   dorzolamide -timolol  (COSOPT ) 2-0.5 % ophthalmic solution Place 1 drop into the right eye 2 (two) times daily.  (Patient taking differently: Place 1 drop into both eyes 2 (two) times daily.)     Allergies:   Codeine   Social History   Socioeconomic History   Marital status: Widowed    Spouse name: Not on file   Number of children: 1   Years of education: Not on file   Highest education level: 12th grade  Occupational History   Occupation: retired  Tobacco Use   Smoking status: Never   Smokeless tobacco: Never  Vaping Use   Vaping status: Never Used  Substance and Sexual Activity   Alcohol  use: No   Drug use: No   Sexual activity: Not Currently  Other Topics Concern   Not on file  Social History Narrative   Widowed   Gets regular exercise   Lives alone/2025   Supportive church network      Are you right handed or left handed? Right Handed   Are you currently employed ? No    What is your current occupation?   Do you live at home alone? Yes   Who lives with you?    What type of home do you live in: 1 story or 2 story? Lives in  a one story home. Son lives right next door to patient.          One son deceased   One son living        Social Drivers of Corporate Investment Banker Strain: Low Risk  (11/15/2023)   Overall Financial Resource Strain (CARDIA)    Difficulty of Paying Living Expenses: Not hard at all  Food Insecurity: No Food Insecurity (11/15/2023)   Hunger Vital Sign    Worried About Running Out of Food in the Last Year: Never true    Ran Out of Food in the Last Year: Never true  Transportation Needs: No Transportation Needs (11/15/2023)   PRAPARE - Administrator, Civil Service (Medical): No    Lack of Transportation (Non-Medical): No  Physical Activity: Inactive (11/15/2023)   Exercise Vital Sign    Days of Exercise per Week: 0 days    Minutes of Exercise per Session: 0 min  Stress: No Stress Concern Present (11/15/2023)   [employer] of Occupational Health - Occupational Stress Questionnaire    Feeling of Stress: Not at all  Social Connections:  Moderately Integrated (11/15/2023)   Social Connection and Isolation Panel    Frequency of Communication with Friends and Family: More than three times a week    Frequency of Social Gatherings with Friends and Family: Once a week    Attends Religious Services: More than 4 times per year    Active Member of [employer] or Organizations: Yes    Attends Banker Meetings: Never    Marital Status: Widowed     Family History: The patient's family history includes Diabetes in her mother; Gout in her brother, brother, and sister; Heart attack in her father and mother; Heart disease in an other family member; Hypertension in an other family member; Lung cancer in an other family member; Thyroid  disease in her brother.  ROS:   Please see the history of present illness.     All other systems reviewed and are negative.  EKGs/Labs/Other Studies Reviewed:    The following studies were reviewed today: Echo 02/05/20: IMPRESSIONS     1. Left ventricular ejection fraction, by estimation, is 60 to 65%. The  left ventricle has normal function. The left ventricle has no regional  wall motion abnormalities. Left ventricular diastolic parameters are  consistent with Grade I diastolic  dysfunction (impaired relaxation). Elevated left atrial pressure. There is  the interventricular septum is flattened in systole, consistent with right  ventricular pressure overload.   2. The right ventricular free wall is hypokinetic, but apical motion and  overall systolic function are preserved (McConnell's sign). Right  ventricular systolic function is normal. The right ventricular size is  mildly enlarged. There is moderately  elevated pulmonary artery systolic pressure. The estimated right  ventricular systolic pressure is 46.6 mmHg.   3. Catheter in the right atrium. Right atrial size was mildly dilated.   4. The mitral valve is normal in structure. No evidence of mitral valve  regurgitation. No evidence of  mitral stenosis.   5. The aortic valve is normal in structure. Aortic valve regurgitation is  not visualized. No aortic stenosis is present.   6. The inferior vena cava is normal in size with greater than 50%  respiratory variability, suggesting right atrial pressure of 3 mmHg.   Comparison(s): A prior study was performed on 01/03/2020. Prior images  reviewed side by side. The right ventricular free wall appears to be  hypokinetic,  there is abnormal ventricular septal motion and there is a  catheter in the right atrium. otherwise  little change.   Event monitor 02/20/20: No significant abnormalities.   Predominant rhythm is sinus rhythm. Range is 49 to 103 bpm with average of 65 bpm. No atrial fibrillation, sustained ventricular tachycardia, significant pause, or high degree AV block. Total ectopy <1%. 0 patient triggered events. No significant abnormalities.      Recent Labs: 07/26/2023: ALT 9; BUN 31; Creatinine, Ser 1.34; Hemoglobin 10.0; Platelets 206.0; Potassium 4.7; Sodium 140; TSH 0.71  Recent Lipid Panel    Component Value Date/Time   CHOL 172 07/26/2023 1458   TRIG 70.0 07/26/2023 1458   HDL 71.30 07/26/2023 1458   CHOLHDL 2 07/26/2023 1458   VLDL 14.0 07/26/2023 1458   LDLCALC 86 07/26/2023 1458   LDLCALC 94 01/13/2021 1636     Risk Assessment/Calculations:    CHA2DS2-VASc Score = 6   This indicates a 9.7% annual risk of stroke. The patient's score is based upon: CHF History: 0 HTN History: 1 Diabetes History: 1 Stroke History: 0 Vascular Disease History: 1 Age Score: 2 Gender Score: 1               Physical Exam:    VS:  BP 124/60 (BP Location: Left Arm, Patient Position: Sitting, Cuff Size: Large)   Pulse 63   Resp 14   Ht 5' 3 (1.6 m)   Wt 173 lb 6.4 oz (78.7 kg)   SpO2 98%   BMI 30.72 kg/m     Wt Readings from Last 3 Encounters:  01/17/24 173 lb 6.4 oz (78.7 kg)  11/15/23 179 lb (81.2 kg)  08/28/23 179 lb 3.2 oz (81.3 kg)     GEN:   Well nourished, elderly in no acute distress HEENT: Normal NECK: No JVD; No carotid bruits LYMPHATICS: No lymphadenopathy CARDIAC: RRR, no murmurs, rubs, gallops RESPIRATORY:  Clear to auscultation without rales, wheezing or rhonchi  ABDOMEN: Soft, non-tender, non-distended MUSCULOSKELETAL:  No edema; arthritic deformities in hands and legs SKIN: Warm and dry NEUROLOGIC:  Alert and oriented x 3 PSYCHIATRIC:  Normal affect   ASSESSMENT:    1. Coronary artery disease involving native coronary artery of native heart without angina pectoris   2. PAF (paroxysmal atrial fibrillation) (HCC)   3. Essential hypertension     PLAN:    In order of problems listed above:  CAD with remote PCI in the 1990s. Now 89 yo. No angina. Not on ASA since on Eliquis . Continue statin.  History of paroxysmal Afib in setting of thyroid  storm in 2021. No clear recurrence. On Eliquis  chronically due to history of PE History of PE on Eliquis  History of thyroid  storm. Recent labs show normal TFTs.  DM on Trulicity . A1c 7.3%       Follow up in one  year     Medication Adjustments/Labs and Tests Ordered: Current medicines are reviewed at length with the patient today.  Concerns regarding medicines are outlined above.  No orders of the defined types were placed in this encounter.  No orders of the defined types were placed in this encounter.   Patient Instructions  Medication Instructions:   *If you need a refill on your cardiac medications before your next appointment, please call your pharmacy*  Lab Work:   Testing/Procedures:   Follow-Up: At [facility], you and your health needs are our priority.  As part of our continuing mission to provide you with exceptional heart care,  our providers are all part of one team.  This team includes your primary Cardiologist (physician) and Advanced Practice Providers or APPs (Physician Assistants and Nurse Practitioners) who all work together to  provide you with the care you need, when you need it.  Your next appointment:      Provider:     We recommend signing up for the patient portal called MyChart.  Sign up information is provided on this After Visit Summary.  MyChart is used to connect with patients for Virtual Visits (Telemedicine).  Patients are able to view lab/test results, encounter notes, upcoming appointments, etc.  Non-urgent messages can be sent to your provider as well.   To learn more about what you can do with MyChart, go to [email or web address].          Signed, Kendahl Bumgardner, MD  01/17/2024 10:00 AM    [facility]

## 2024-01-16 ENCOUNTER — Other Ambulatory Visit: Payer: Self-pay | Admitting: Internal Medicine

## 2024-01-17 ENCOUNTER — Ambulatory Visit: Attending: Cardiology | Admitting: Cardiology

## 2024-01-17 ENCOUNTER — Encounter: Payer: Self-pay | Admitting: Cardiology

## 2024-01-17 VITALS — BP 124/60 | HR 63 | Resp 14 | Ht 63.0 in | Wt 173.4 lb

## 2024-01-17 DIAGNOSIS — I2510 Atherosclerotic heart disease of native coronary artery without angina pectoris: Secondary | ICD-10-CM

## 2024-01-17 DIAGNOSIS — I480 Paroxysmal atrial fibrillation: Secondary | ICD-10-CM | POA: Diagnosis not present

## 2024-01-17 DIAGNOSIS — I10 Essential (primary) hypertension: Secondary | ICD-10-CM

## 2024-01-17 NOTE — Patient Instructions (Addendum)
 Medication Instructions:  Continue same medications *If you need a refill on your cardiac medications before your next appointment, please call your pharmacy*  Lab Work: None ordered  Testing/Procedures: None ordered  Follow-Up: At [facility], you and your health needs are our priority.  As part of our continuing mission to provide you with exceptional heart care, our providers are all part of one team.  This team includes your primary Cardiologist (physician) and Advanced Practice Providers or APPs (Physician Assistants and Nurse Practitioners) who all work together to provide you with the care you need, when you need it.  Your next appointment:  1 year    Call in July to schedule Nov appointment     Provider:  Dr.Jordan   We recommend signing up for the patient portal called MyChart.  Sign up information is provided on this After Visit Summary.  MyChart is used to connect with patients for Virtual Visits (Telemedicine).  Patients are able to view lab/test results, encounter notes, upcoming appointments, etc.  Non-urgent messages can be sent to your provider as well.   To learn more about what you can do with MyChart, go to [email or web address].

## 2024-01-27 ENCOUNTER — Ambulatory Visit: Admitting: Internal Medicine

## 2024-01-30 ENCOUNTER — Encounter: Payer: Self-pay | Admitting: Internal Medicine

## 2024-01-30 NOTE — Progress Notes (Signed)
 Subjective:    Patient ID: Rebecca [name] Lady, female    DOB: Mar 18, 1932, 89 y.o.   MRN: [id number]     HPI [name] is here for follow up of her chronic medical problems.   Doing well.  She has no concerns.     Medications and allergies reviewed with patient and updated if appropriate.  Current Outpatient Medications on File Prior to Visit  Medication Sig Dispense Refill   acetaminophen  (TYLENOL ) 500 MG tablet Take 1,000 mg by mouth daily as needed for moderate pain.     Ascorbic Acid (VITAMIN C) 500 MG tablet Take 500 mg by mouth daily.     atorvastatin  (LIPITOR) 10 MG tablet TAKE 1 TABLET ONE TIME DAILY AT 6PM 90 tablet 3   blood glucose meter kit and supplies KIT Dispense based on patient and insurance preference. Use up to four times daily as directed. (FOR E11.9).Dispense based on patient and insurance preference. Use up to four times daily as directed. (FOR E11.9). Patient would like to stick with Accu-check meter if covered 1 each 0   calcium  carbonate (OS-CAL) 600 MG TABS Take 600 mg by mouth daily.     Cholecalciferol (VITAMIN D3) 125 MCG (5000 UT) CAPS Take 1 capsule (5,000 Units total) by mouth daily. 90 capsule 3   Cod Liver Oil 1000 MG CAPS Take 1,000 mg by mouth daily.      dorzolamide -timolol  (COSOPT ) 2-0.5 % ophthalmic solution Place 1 drop into the right eye 2 (two) times daily. (Patient taking differently: Place 1 drop into both eyes 2 (two) times daily.)     Dulaglutide  (TRULICITY ) 4.5 MG/0.5ML SOAJ INJECT 4.5MG  (1 PEN) UNDER THE SKIN EVERY WEEK 6 mL 1   Elastic Bandages & Supports (LUMBAR BACK BRACE/SUPPORT PAD) MISC UAD when standing long periods of time for chronic back pain 1 each 0   ELIQUIS  5 MG TABS tablet TAKE 1 TABLET TWICE DAILY 180 tablet 3   gabapentin  (NEURONTIN ) 300 MG capsule TAKE 1 CAPSULE EVERY DAY 90 capsule 3   glucose blood (ACCU-CHEK GUIDE) test strip TEST BLOOD SUGAR UP TO FOUR TIMES DAILY 400 strip 2   Lancet Devices (ONETOUCH  DELICA PLUS LANCING) MISC USE AS DIRECTED 1 each 3   Lancets (ONETOUCH DELICA PLUS LANCET33G) MISC USE AS INSTRUCTED 400 each 3   lisinopril  (ZESTRIL ) 20 MG tablet Take 1 tablet (20 mg total) by mouth daily. 90 tablet 3   Multiple Vitamin (MULTIVITAMIN) tablet Take 1 tablet by mouth daily. Prn     nitroGLYCERIN  (NITROSTAT ) 0.4 MG SL tablet DISSOLVE ONE TABLET UNDER TONGUE AS DIRECTED FOR CHEST PAIN 25 tablet 5   omeprazole  (PRILOSEC) 20 MG capsule TAKE 1 CAPSULE EVERY DAY (STOP PANTOPRAZOLE ) 90 capsule 3   Turmeric 500 MG CAPS Take 1 capsule by mouth daily in the afternoon.     VYZULTA 0.024 % SOLN Apply 1 drop to eye at bedtime.     No current facility-administered medications on file prior to visit.     Review of Systems  Constitutional:  Negative for fever.  Respiratory:  Negative for cough, shortness of breath and wheezing.   Cardiovascular:  Positive for leg swelling. Negative for chest pain and palpitations.  Gastrointestinal:  Negative for abdominal pain.       Gerd controlled  Neurological:  Positive for weakness (left leg). Negative for dizziness, light-headedness and headaches.       Objective:   Vitals:   01/31/24 1317  BP: 132/70  Pulse: 70  Temp: 98 F (36.7 C)  SpO2: 100%   BP Readings from Last 3 Encounters:  01/31/24 132/70  01/17/24 124/60  08/28/23 (!) 106/54   Wt Readings from Last 3 Encounters:  01/31/24 171 lb (77.6 kg)  01/17/24 173 lb 6.4 oz (78.7 kg)  11/15/23 179 lb (81.2 kg)   Body mass index is 30.29 kg/m.    Physical Exam Constitutional:      General: She is not in acute distress.    Appearance: Normal appearance.  HENT:     Head: Normocephalic and atraumatic.  Eyes:     Conjunctiva/sclera: Conjunctivae normal.  Cardiovascular:     Rate and Rhythm: Normal rate and regular rhythm.     Heart sounds: Murmur heard.  Pulmonary:     Effort: Pulmonary effort is normal. No respiratory distress.     Breath sounds: Normal breath sounds. No  wheezing.  Musculoskeletal:     Cervical back: Neck supple.     Right lower leg: Edema (Mild) present.     Left lower leg: Edema (Mild) present.  Lymphadenopathy:     Cervical: No cervical adenopathy.  Skin:    General: Skin is warm and dry.     Findings: No rash.  Neurological:     Mental Status: She is alert. Mental status is at baseline.  Psychiatric:        Mood and Affect: Mood normal.        Behavior: Behavior normal.        Lab Results  Component Value Date   WBC 4.5 07/26/2023   HGB 10.0 (L) 07/26/2023   HCT 30.4 (L) 07/26/2023   PLT 206.0 07/26/2023   GLUCOSE 107 (H) 07/26/2023   CHOL 172 07/26/2023   TRIG 70.0 07/26/2023   HDL 71.30 07/26/2023   LDLCALC 86 07/26/2023   ALT 9 07/26/2023   AST 13 07/26/2023   NA 140 07/26/2023   K 4.7 07/26/2023   CL 106 07/26/2023   CREATININE 1.34 (H) 07/26/2023   BUN 31 (H) 07/26/2023   CO2 27 07/26/2023   TSH 0.71 07/26/2023   INR 1.2 03/12/2023   HGBA1C 7.3 (H) 07/26/2023   MICROALBUR 2.24 (H) 04/20/2013     Assessment & Plan:    See Problem List for Assessment and Plan of chronic medical problems.

## 2024-01-31 ENCOUNTER — Ambulatory Visit (INDEPENDENT_AMBULATORY_CARE_PROVIDER_SITE_OTHER): Admitting: Internal Medicine

## 2024-01-31 VITALS — BP 132/70 | HR 70 | Temp 98.0°F | Ht 63.0 in | Wt 171.0 lb

## 2024-01-31 DIAGNOSIS — Z8639 Personal history of other endocrine, nutritional and metabolic disease: Secondary | ICD-10-CM

## 2024-01-31 DIAGNOSIS — E785 Hyperlipidemia, unspecified: Secondary | ICD-10-CM | POA: Diagnosis not present

## 2024-01-31 DIAGNOSIS — K219 Gastro-esophageal reflux disease without esophagitis: Secondary | ICD-10-CM | POA: Diagnosis not present

## 2024-01-31 DIAGNOSIS — I480 Paroxysmal atrial fibrillation: Secondary | ICD-10-CM | POA: Diagnosis not present

## 2024-01-31 DIAGNOSIS — I2510 Atherosclerotic heart disease of native coronary artery without angina pectoris: Secondary | ICD-10-CM | POA: Diagnosis not present

## 2024-01-31 DIAGNOSIS — N1831 Chronic kidney disease, stage 3a: Secondary | ICD-10-CM | POA: Diagnosis not present

## 2024-01-31 DIAGNOSIS — E1122 Type 2 diabetes mellitus with diabetic chronic kidney disease: Secondary | ICD-10-CM | POA: Diagnosis not present

## 2024-01-31 DIAGNOSIS — I152 Hypertension secondary to endocrine disorders: Secondary | ICD-10-CM

## 2024-01-31 DIAGNOSIS — E1159 Type 2 diabetes mellitus with other circulatory complications: Secondary | ICD-10-CM

## 2024-01-31 DIAGNOSIS — E1169 Type 2 diabetes mellitus with other specified complication: Secondary | ICD-10-CM | POA: Diagnosis not present

## 2024-01-31 DIAGNOSIS — Z23 Encounter for immunization: Secondary | ICD-10-CM

## 2024-01-31 DIAGNOSIS — M8589 Other specified disorders of bone density and structure, multiple sites: Secondary | ICD-10-CM

## 2024-01-31 DIAGNOSIS — I69354 Hemiplegia and hemiparesis following cerebral infarction affecting left non-dominant side: Secondary | ICD-10-CM

## 2024-01-31 LAB — CBC
HCT: 31.8 % — ABNORMAL LOW (ref 36.0–46.0)
Hemoglobin: 10.5 g/dL — ABNORMAL LOW (ref 12.0–15.0)
MCHC: 33.1 g/dL (ref 30.0–36.0)
MCV: 79.4 fl (ref 78.0–100.0)
Platelets: 200 K/uL (ref 150.0–400.0)
RBC: 4 Mil/uL (ref 3.87–5.11)
RDW: 16.6 % — ABNORMAL HIGH (ref 11.5–15.5)
WBC: 4.8 K/uL (ref 4.0–10.5)

## 2024-01-31 LAB — LIPID PANEL
Cholesterol: 175 mg/dL (ref 0–200)
HDL: 73 mg/dL (ref >39.00)
LDL Cholesterol: 86 mg/dL (ref 0–99)
NonHDL: 102.47
Total CHOL/HDL Ratio: 2
Triglycerides: 84 mg/dL (ref 0.0–149.0)
VLDL: 16.8 mg/dL (ref 0.0–40.0)

## 2024-01-31 LAB — COMPREHENSIVE METABOLIC PANEL WITH GFR
ALT: 8 U/L (ref 0–35)
AST: 15 U/L (ref 0–37)
Albumin: 3.9 g/dL (ref 3.5–5.2)
Alkaline Phosphatase: 48 U/L (ref 39–117)
BUN: 28 mg/dL — ABNORMAL HIGH (ref 6–23)
CO2: 29 meq/L (ref 19–32)
Calcium: 9.4 mg/dL (ref 8.4–10.5)
Chloride: 105 meq/L (ref 96–112)
Creatinine, Ser: 1.21 mg/dL — ABNORMAL HIGH (ref 0.40–1.20)
GFR: 39.71 mL/min — ABNORMAL LOW (ref >60.00)
Glucose, Bld: 120 mg/dL — ABNORMAL HIGH (ref 70–99)
Potassium: 4.3 meq/L (ref 3.5–5.1)
Sodium: 139 meq/L (ref 135–145)
Total Bilirubin: 0.4 mg/dL (ref 0.2–1.2)
Total Protein: 6.9 g/dL (ref 6.0–8.3)

## 2024-01-31 LAB — T4, FREE: Free T4: 0.81 ng/dL (ref 0.60–1.60)

## 2024-01-31 LAB — HEMOGLOBIN A1C: Hgb A1c MFr Bld: 7.1 % — ABNORMAL HIGH (ref 4.6–6.5)

## 2024-01-31 LAB — TSH: TSH: 0.8 u[IU]/mL (ref 0.35–5.50)

## 2024-01-31 LAB — T3, FREE: T3, Free: 2.6 pg/mL (ref 2.3–4.2)

## 2024-01-31 NOTE — Assessment & Plan Note (Addendum)
 Chronic Still with left leg weakness, which bothers her Continue Eliquis  5 mg twice daily Blood pressure controlled Sugars well-controlled Not able to do additional physical therapy this year, but can consider it for next year which would help with her ambulation and help prevent falls Continue use of walker

## 2024-01-31 NOTE — Assessment & Plan Note (Signed)
 Check TFTs Medically euthyroid

## 2024-01-31 NOTE — Assessment & Plan Note (Signed)
Chronic GERD controlled Continue omeprazole 20 mg daily  

## 2024-01-31 NOTE — Assessment & Plan Note (Addendum)
 Chronic Lab Results  Component Value Date   HGBA1C 7.3 (H) 07/26/2023   Sugars adequately controlled Check A1c Continue to monitor sugars regularly Continue Trulicity  4.5 mg weekly She does have fairly decreased appetite and may need to consider decreasing Trulicity  to 3 mg weekly I will continue to prescribe diabetic supplies  -  glucometer and supplies including lancets, lancet device, glucose strips

## 2024-01-31 NOTE — Assessment & Plan Note (Signed)
Chronic Following with cardiology No symptoms consistent with angina Continue atorvastatin 10 mg daily

## 2024-01-31 NOTE — Assessment & Plan Note (Signed)
 Chronic Stable CMP, CBC

## 2024-01-31 NOTE — Assessment & Plan Note (Signed)
 Chronic Following with cardiology Taking Eliquis  twice daily Cmp, cbc

## 2024-01-31 NOTE — Assessment & Plan Note (Signed)
 Chronic Associated with diabetes Lab Results  Component Value Date   LDLCALC 86 07/26/2023    Regular exercise and healthy diet encouraged Check lipid panel , CMP Continue atorvastatin  10 mg daily

## 2024-01-31 NOTE — Assessment & Plan Note (Signed)
 Chronic DEXA up-to-date Severe osteopenia-low FRAX Continue calcium and vitamin D intake Encourage as much exercise as possible

## 2024-01-31 NOTE — Patient Instructions (Addendum)
    Flu immunization administered today.     Blood work was ordered.       Medications changes include :   None     Return in about 6 months (around 07/30/2024) for Physical Exam.

## 2024-01-31 NOTE — Assessment & Plan Note (Signed)
 Chronic Blood pressure controlled Monitor BP at home Continue lisinopril  20 mg daily CMP, CBC

## 2024-02-01 ENCOUNTER — Ambulatory Visit: Payer: Self-pay | Admitting: Internal Medicine

## 2024-02-11 ENCOUNTER — Telehealth: Payer: Self-pay

## 2024-02-11 NOTE — Telephone Encounter (Signed)
 Copied from CRM [id number]. Topic: Clinical - Lab/Test Results >> Feb 11, 2024  4:53 PM Hadassah PARAS wrote: Reason for CRM: Pt is calling for test results from 11/21. Please call pt on #[phone]

## 2024-02-12 ENCOUNTER — Telehealth: Payer: Self-pay

## 2024-02-12 NOTE — Telephone Encounter (Signed)
 Copied from CRM #[id number]. Topic: Clinical - Lab/Test Results >> Feb 12, 2024 10:22 AM Aleatha C wrote: Reason for CRM: Patient  called and  lab results was relayed and  she said she doesn't want to decrease the  Trulicity  right now, and she would like to know her number for her sugar

## 2024-02-12 NOTE — Telephone Encounter (Signed)
 Message left for patient today to return call to clinic.  If she calls back okay to release following results to her:  Your anemia is stable.  Your other blood counts, liver tests are normal.  Your kidney function is stable.  Your sugars are adequately controlled and we can consider decreasing the trulicity  to 3 mg to help increase your appetite if you want.  Your cholesterol is good.   Your thyroid  function is normal.      Please let us  know if she wants to decrease Trulicity  as well.

## 2024-02-13 NOTE — Telephone Encounter (Signed)
Spoke with patient and all questions answered.

## 2024-02-19 ENCOUNTER — Other Ambulatory Visit: Payer: Self-pay | Admitting: Internal Medicine

## 2024-03-02 ENCOUNTER — Other Ambulatory Visit: Payer: Self-pay | Admitting: Internal Medicine

## 2024-03-18 ENCOUNTER — Telehealth: Payer: Self-pay

## 2024-03-18 NOTE — Telephone Encounter (Signed)
 Copied from CRM [id number]. Topic: Clinical - Medication Question >> Mar 18, 2024 12:18 PM [name] wrote: Reason for CRM: Patient called in stating that she has been having some back pain and would like to know it if she could prescribed hydrocodone  ? She stated that provider has prescribed her this in 2022 when she was having some pain. I advised patient that she may have to come in for a visit to be evaluated ,however she stated that she was prescribed this before.   [facility] [address] ([address]), [place] - [address]  Phone: [phone] Fax: [phone]

## 2024-03-25 ENCOUNTER — Telehealth: Payer: Self-pay

## 2024-03-25 ENCOUNTER — Other Ambulatory Visit: Payer: Self-pay | Admitting: Internal Medicine

## 2024-03-25 DIAGNOSIS — E1142 Type 2 diabetes mellitus with diabetic polyneuropathy: Secondary | ICD-10-CM

## 2024-03-25 NOTE — Telephone Encounter (Signed)
 Copied from CRM [id number]. Topic: Clinical - Medication Question >> Mar 18, 2024 12:18 PM [name] wrote: Reason for CRM: Patient called in stating that she has been having some back pain and would like to know it if she could prescribed hydrocodone  ? She stated that provider has prescribed her this in 2022 when she was having some pain. I advised patient that she may have to come in for a visit to be evaluated ,however she stated that she was prescribed this before.   [facility] [address] [place]), [place] - [address]  Phone: [phone] Fax: [phone] >> Mar 25, 2024 11:39 AM [name] wrote: Pt calling back to follow up on this request. She stated nothing has been called in and she is still in need of an rx. Please call back to advise, [phone]

## 2024-03-25 NOTE — Telephone Encounter (Signed)
 Duplicate

## 2024-03-26 NOTE — Telephone Encounter (Signed)
 Called and left message for patient that she needs an appointment.  Also sent message to patient.

## 2024-03-26 NOTE — Telephone Encounter (Signed)
 She really needs an appointment.  At this is not a routine medication and appointment is needed.  We did be able to get her in with someone tomorrow.

## 2024-04-15 ENCOUNTER — Ambulatory Visit: Admitting: Podiatry

## 2024-04-28 ENCOUNTER — Ambulatory Visit: Admitting: Podiatry

## 2024-07-31 ENCOUNTER — Ambulatory Visit: Admitting: Internal Medicine
